# Patient Record
Sex: Male | Born: 1945 | Race: White | Hispanic: No | Marital: Married | Smoking: Former smoker
Health system: Southern US, Community
[De-identification: ages and names within clinical notes are randomized; demographics above are authoritative.]

## PROBLEM LIST (undated history)

## (undated) DIAGNOSIS — I2510 Atherosclerotic heart disease of native coronary artery without angina pectoris: Secondary | ICD-10-CM

## (undated) DIAGNOSIS — G8929 Other chronic pain: Secondary | ICD-10-CM

## (undated) DIAGNOSIS — F4310 Post-traumatic stress disorder, unspecified: Secondary | ICD-10-CM

## (undated) DIAGNOSIS — I255 Ischemic cardiomyopathy: Secondary | ICD-10-CM

## (undated) DIAGNOSIS — Z9861 Coronary angioplasty status: Secondary | ICD-10-CM

## (undated) DIAGNOSIS — K8020 Calculus of gallbladder without cholecystitis without obstruction: Secondary | ICD-10-CM

## (undated) DIAGNOSIS — N183 Chronic kidney disease, stage 3 unspecified: Secondary | ICD-10-CM

## (undated) DIAGNOSIS — F329 Major depressive disorder, single episode, unspecified: Secondary | ICD-10-CM

## (undated) DIAGNOSIS — K5792 Diverticulitis of intestine, part unspecified, without perforation or abscess without bleeding: Secondary | ICD-10-CM

## (undated) DIAGNOSIS — K5791 Diverticulosis of intestine, part unspecified, without perforation or abscess with bleeding: Secondary | ICD-10-CM

## (undated) DIAGNOSIS — I2119 ST elevation (STEMI) myocardial infarction involving other coronary artery of inferior wall: Secondary | ICD-10-CM

## (undated) DIAGNOSIS — N400 Enlarged prostate without lower urinary tract symptoms: Secondary | ICD-10-CM

## (undated) DIAGNOSIS — K219 Gastro-esophageal reflux disease without esophagitis: Secondary | ICD-10-CM

## (undated) DIAGNOSIS — R519 Headache, unspecified: Secondary | ICD-10-CM

## (undated) DIAGNOSIS — E7800 Pure hypercholesterolemia, unspecified: Secondary | ICD-10-CM

## (undated) DIAGNOSIS — F32A Depression, unspecified: Secondary | ICD-10-CM

## (undated) DIAGNOSIS — F0390 Unspecified dementia without behavioral disturbance: Secondary | ICD-10-CM

## (undated) DIAGNOSIS — I214 Non-ST elevation (NSTEMI) myocardial infarction: Secondary | ICD-10-CM

## (undated) DIAGNOSIS — I5042 Chronic combined systolic (congestive) and diastolic (congestive) heart failure: Secondary | ICD-10-CM

## (undated) DIAGNOSIS — C9110 Chronic lymphocytic leukemia of B-cell type not having achieved remission: Principal | ICD-10-CM

## (undated) DIAGNOSIS — R51 Headache: Secondary | ICD-10-CM

## (undated) HISTORY — DX: Atherosclerotic heart disease of native coronary artery without angina pectoris: Z98.61

## (undated) HISTORY — DX: Post-traumatic stress disorder, unspecified: F43.10

## (undated) HISTORY — DX: Chronic kidney disease, stage 3 unspecified: N18.30

## (undated) HISTORY — DX: Pure hypercholesterolemia, unspecified: E78.00

## (undated) HISTORY — DX: Atherosclerotic heart disease of native coronary artery without angina pectoris: I25.10

## (undated) HISTORY — DX: Chronic lymphocytic leukemia of B-cell type not having achieved remission: C91.10

## (undated) HISTORY — DX: Calculus of gallbladder without cholecystitis without obstruction: K80.20

## (undated) HISTORY — PX: TRANSTHORACIC ECHOCARDIOGRAM: SHX275

## (undated) HISTORY — DX: Chronic combined systolic (congestive) and diastolic (congestive) heart failure: I50.42

## (undated) HISTORY — PX: APPENDECTOMY: SHX54

## (undated) HISTORY — DX: Benign prostatic hyperplasia without lower urinary tract symptoms: N40.0

## (undated) HISTORY — DX: Diverticulitis of intestine, part unspecified, without perforation or abscess without bleeding: K57.92

## (undated) HISTORY — DX: Unspecified dementia, unspecified severity, without behavioral disturbance, psychotic disturbance, mood disturbance, and anxiety: F03.90

---

## 2004-02-28 ENCOUNTER — Emergency Department (HOSPITAL_COMMUNITY): Admission: EM | Admit: 2004-02-28 | Discharge: 2004-02-28 | Payer: Self-pay | Admitting: Emergency Medicine

## 2004-09-29 ENCOUNTER — Ambulatory Visit (HOSPITAL_COMMUNITY): Admission: RE | Admit: 2004-09-29 | Discharge: 2004-09-29 | Payer: Self-pay | Admitting: Family Medicine

## 2006-06-06 HISTORY — PX: COLONOSCOPY: SHX174

## 2006-12-18 ENCOUNTER — Encounter (HOSPITAL_COMMUNITY): Payer: Self-pay | Admitting: Oncology

## 2006-12-18 ENCOUNTER — Encounter (HOSPITAL_COMMUNITY): Admission: RE | Admit: 2006-12-18 | Discharge: 2007-01-17 | Payer: Self-pay | Admitting: Oncology

## 2006-12-18 ENCOUNTER — Ambulatory Visit (HOSPITAL_COMMUNITY): Payer: Self-pay | Admitting: Oncology

## 2007-01-29 ENCOUNTER — Encounter: Payer: Self-pay | Admitting: Internal Medicine

## 2007-01-29 ENCOUNTER — Ambulatory Visit: Payer: Self-pay | Admitting: Internal Medicine

## 2007-01-29 ENCOUNTER — Ambulatory Visit (HOSPITAL_COMMUNITY): Admission: RE | Admit: 2007-01-29 | Discharge: 2007-01-29 | Payer: Self-pay | Admitting: Internal Medicine

## 2007-02-09 ENCOUNTER — Ambulatory Visit (HOSPITAL_COMMUNITY): Payer: Self-pay | Admitting: Oncology

## 2007-02-09 ENCOUNTER — Encounter (HOSPITAL_COMMUNITY): Admission: RE | Admit: 2007-02-09 | Discharge: 2007-03-06 | Payer: Self-pay | Admitting: Oncology

## 2007-05-08 ENCOUNTER — Ambulatory Visit (HOSPITAL_COMMUNITY): Admission: RE | Admit: 2007-05-08 | Discharge: 2007-05-08 | Payer: Self-pay | Admitting: Family Medicine

## 2007-08-13 ENCOUNTER — Ambulatory Visit (HOSPITAL_COMMUNITY): Payer: Self-pay | Admitting: Oncology

## 2007-08-13 ENCOUNTER — Encounter (HOSPITAL_COMMUNITY): Admission: RE | Admit: 2007-08-13 | Discharge: 2007-09-12 | Payer: Self-pay | Admitting: Oncology

## 2008-03-07 ENCOUNTER — Ambulatory Visit (HOSPITAL_COMMUNITY): Admission: RE | Admit: 2008-03-07 | Discharge: 2008-03-07 | Payer: Self-pay | Admitting: Family Medicine

## 2008-03-21 ENCOUNTER — Encounter (HOSPITAL_COMMUNITY): Admission: RE | Admit: 2008-03-21 | Discharge: 2008-04-20 | Payer: Self-pay | Admitting: Oncology

## 2008-03-21 ENCOUNTER — Ambulatory Visit (HOSPITAL_COMMUNITY): Payer: Self-pay | Admitting: Oncology

## 2008-08-01 ENCOUNTER — Ambulatory Visit (HOSPITAL_COMMUNITY): Admission: RE | Admit: 2008-08-01 | Discharge: 2008-08-01 | Payer: Self-pay | Admitting: Family Medicine

## 2008-09-09 ENCOUNTER — Ambulatory Visit (HOSPITAL_COMMUNITY): Payer: Self-pay | Admitting: Oncology

## 2008-11-25 ENCOUNTER — Ambulatory Visit (HOSPITAL_COMMUNITY): Payer: Self-pay | Admitting: Oncology

## 2008-11-25 ENCOUNTER — Encounter (HOSPITAL_COMMUNITY): Admission: RE | Admit: 2008-11-25 | Discharge: 2008-12-25 | Payer: Self-pay | Admitting: Oncology

## 2009-04-06 ENCOUNTER — Encounter (HOSPITAL_COMMUNITY): Admission: RE | Admit: 2009-04-06 | Discharge: 2009-05-06 | Payer: Self-pay | Admitting: Oncology

## 2009-04-06 ENCOUNTER — Ambulatory Visit (HOSPITAL_COMMUNITY): Payer: Self-pay | Admitting: Oncology

## 2009-10-05 ENCOUNTER — Encounter (HOSPITAL_COMMUNITY): Admission: RE | Admit: 2009-10-05 | Discharge: 2009-11-04 | Payer: Self-pay | Admitting: Oncology

## 2009-10-06 ENCOUNTER — Ambulatory Visit (HOSPITAL_COMMUNITY): Payer: Self-pay | Admitting: Oncology

## 2010-01-20 ENCOUNTER — Ambulatory Visit (HOSPITAL_COMMUNITY): Admission: RE | Admit: 2010-01-20 | Discharge: 2010-01-20 | Payer: Self-pay | Admitting: Family Medicine

## 2010-02-09 ENCOUNTER — Ambulatory Visit (HOSPITAL_COMMUNITY): Admission: RE | Admit: 2010-02-09 | Discharge: 2010-02-09 | Payer: Self-pay | Admitting: Family Medicine

## 2010-04-08 ENCOUNTER — Ambulatory Visit (HOSPITAL_COMMUNITY): Payer: Self-pay | Admitting: Oncology

## 2010-04-08 ENCOUNTER — Encounter (HOSPITAL_COMMUNITY)
Admission: RE | Admit: 2010-04-08 | Discharge: 2010-05-08 | Payer: Self-pay | Source: Home / Self Care | Admitting: Oncology

## 2010-08-17 LAB — CBC
HCT: 47.6 % (ref 39.0–52.0)
Hemoglobin: 15.2 g/dL (ref 13.0–17.0)
MCH: 29.8 pg (ref 26.0–34.0)
MCHC: 31.9 g/dL (ref 30.0–36.0)
MCV: 93.4 fL (ref 78.0–100.0)
Platelets: 208 10*3/uL (ref 150–400)
RBC: 5.1 MIL/uL (ref 4.22–5.81)
RDW: 14.3 % (ref 11.5–15.5)
WBC: 44.3 10*3/uL — ABNORMAL HIGH (ref 4.0–10.5)

## 2010-08-17 LAB — DIFFERENTIAL
Basophils Absolute: 0.1 10*3/uL (ref 0.0–0.1)
Basophils Relative: 0 % (ref 0–1)
Eosinophils Absolute: 0.3 10*3/uL (ref 0.0–0.7)
Eosinophils Relative: 1 % (ref 0–5)
Lymphocytes Relative: 88 % — ABNORMAL HIGH (ref 12–46)
Lymphs Abs: 39.1 10*3/uL — ABNORMAL HIGH (ref 0.7–4.0)
Monocytes Absolute: 1.7 10*3/uL — ABNORMAL HIGH (ref 0.1–1.0)
Monocytes Relative: 4 % (ref 3–12)
Neutro Abs: 3.2 10*3/uL (ref 1.7–7.7)
Neutrophils Relative %: 7 % — ABNORMAL LOW (ref 43–77)

## 2010-08-24 LAB — DIFFERENTIAL
Basophils Absolute: 0 10*3/uL (ref 0.0–0.1)
Basophils Relative: 0 % (ref 0–1)
Eosinophils Absolute: 0.5 10*3/uL (ref 0.0–0.7)
Eosinophils Relative: 1 % (ref 0–5)
Lymphocytes Relative: 90 % — ABNORMAL HIGH (ref 12–46)
Lymphs Abs: 44.4 10*3/uL — ABNORMAL HIGH (ref 0.7–4.0)
Monocytes Absolute: 0.5 10*3/uL (ref 0.1–1.0)
Monocytes Relative: 1 % — ABNORMAL LOW (ref 3–12)
Neutro Abs: 3.9 10*3/uL (ref 1.7–7.7)
Neutrophils Relative %: 8 % — ABNORMAL LOW (ref 43–77)

## 2010-08-24 LAB — CBC
HCT: 48.2 % (ref 39.0–52.0)
Hemoglobin: 15.9 g/dL (ref 13.0–17.0)
MCHC: 33.1 g/dL (ref 30.0–36.0)
MCV: 92.8 fL (ref 78.0–100.0)
Platelets: 205 10*3/uL (ref 150–400)
RBC: 5.19 MIL/uL (ref 4.22–5.81)
RDW: 14.7 % (ref 11.5–15.5)
WBC: 49.3 10*3/uL — ABNORMAL HIGH (ref 4.0–10.5)

## 2010-09-08 LAB — CBC
HCT: 47.5 % (ref 39.0–52.0)
Hemoglobin: 15.6 g/dL (ref 13.0–17.0)
MCHC: 32.8 g/dL (ref 30.0–36.0)
MCV: 93.5 fL (ref 78.0–100.0)
Platelets: 209 10*3/uL (ref 150–400)
RBC: 5.08 MIL/uL (ref 4.22–5.81)
RDW: 14 % (ref 11.5–15.5)
WBC: 41.1 10*3/uL — ABNORMAL HIGH (ref 4.0–10.5)

## 2010-09-08 LAB — DIFFERENTIAL
Basophils Absolute: 0 10*3/uL (ref 0.0–0.1)
Basophils Relative: 0 % (ref 0–1)
Eosinophils Absolute: 0.4 10*3/uL (ref 0.0–0.7)
Eosinophils Relative: 1 % (ref 0–5)
Lymphocytes Relative: 85 % — ABNORMAL HIGH (ref 12–46)
Lymphs Abs: 34.9 10*3/uL — ABNORMAL HIGH (ref 0.7–4.0)
Monocytes Absolute: 2.1 10*3/uL — ABNORMAL HIGH (ref 0.1–1.0)
Monocytes Relative: 5 % (ref 3–12)
Neutro Abs: 3.7 10*3/uL (ref 1.7–7.7)
Neutrophils Relative %: 9 % — ABNORMAL LOW (ref 43–77)

## 2010-09-13 LAB — DIFFERENTIAL
Basophils Absolute: 0 10*3/uL (ref 0.0–0.1)
Basophils Relative: 0 % (ref 0–1)
Eosinophils Absolute: 0.3 10*3/uL (ref 0.0–0.7)
Eosinophils Relative: 1 % (ref 0–5)
Lymphocytes Relative: 86 % — ABNORMAL HIGH (ref 12–46)
Lymphs Abs: 29.5 10*3/uL — ABNORMAL HIGH (ref 0.7–4.0)
Monocytes Absolute: 0.3 10*3/uL (ref 0.1–1.0)
Monocytes Relative: 1 % — ABNORMAL LOW (ref 3–12)
Neutro Abs: 4.1 10*3/uL (ref 1.7–7.7)
Neutrophils Relative %: 12 % — ABNORMAL LOW (ref 43–77)

## 2010-09-13 LAB — IMMUNOFIXATION ELECTROPHORESIS
IgA: 161 mg/dL (ref 68–378)
IgG (Immunoglobin G), Serum: 995 mg/dL (ref 694–1618)
IgM, Serum: 176 mg/dL (ref 60–263)
Total Protein ELP: 6.7 g/dL (ref 6.0–8.3)

## 2010-09-13 LAB — CBC
HCT: 46.8 % (ref 39.0–52.0)
Hemoglobin: 15.4 g/dL (ref 13.0–17.0)
MCHC: 33 g/dL (ref 30.0–36.0)
MCV: 92.5 fL (ref 78.0–100.0)
Platelets: 188 10*3/uL (ref 150–400)
RBC: 5.06 MIL/uL (ref 4.22–5.81)
RDW: 14.6 % (ref 11.5–15.5)
WBC: 34.2 10*3/uL — ABNORMAL HIGH (ref 4.0–10.5)

## 2010-09-13 LAB — IGG, IGA, IGM
IgA: 169 mg/dL (ref 68–378)
IgG (Immunoglobin G), Serum: 1030 mg/dL (ref 694–1618)
IgM, Serum: 186 mg/dL (ref 60–263)

## 2010-09-13 LAB — LACTATE DEHYDROGENASE: LDH: 149 U/L (ref 94–250)

## 2010-09-13 LAB — PROTEIN ELECTROPH W RFLX QUANT IMMUNOGLOBULINS
Albumin ELP: 62.5 % (ref 55.8–66.1)
Alpha-1-Globulin: 3.8 % (ref 2.9–4.9)
Alpha-2-Globulin: 9.1 % (ref 7.1–11.8)
Beta 2: 4.3 % (ref 3.2–6.5)
Beta Globulin: 6.1 % (ref 4.7–7.2)
Gamma Globulin: 14.2 % (ref 11.1–18.8)
M-Spike, %: NOT DETECTED g/dL
Total Protein ELP: 6.9 g/dL (ref 6.0–8.3)

## 2010-09-13 LAB — IMMUNOFIXATION ADD-ON

## 2010-09-23 ENCOUNTER — Other Ambulatory Visit (HOSPITAL_COMMUNITY): Payer: Self-pay

## 2010-09-24 ENCOUNTER — Ambulatory Visit (HOSPITAL_COMMUNITY): Payer: Self-pay | Admitting: Oncology

## 2010-09-24 ENCOUNTER — Encounter (HOSPITAL_COMMUNITY): Payer: Self-pay | Admitting: Oncology

## 2010-09-24 ENCOUNTER — Ambulatory Visit (HOSPITAL_COMMUNITY): Payer: Self-pay | Admitting: *Deleted

## 2010-09-28 ENCOUNTER — Encounter (HOSPITAL_COMMUNITY): Payer: Medicare Other | Attending: Oncology

## 2010-09-28 DIAGNOSIS — C9110 Chronic lymphocytic leukemia of B-cell type not having achieved remission: Secondary | ICD-10-CM | POA: Insufficient documentation

## 2010-09-29 ENCOUNTER — Encounter (HOSPITAL_COMMUNITY): Payer: Medicare Other | Admitting: Oncology

## 2010-09-29 DIAGNOSIS — C9110 Chronic lymphocytic leukemia of B-cell type not having achieved remission: Secondary | ICD-10-CM

## 2010-10-19 NOTE — Op Note (Signed)
[name], [name]                ACCOUNT NO.:  [id number]   MEDICAL RECORD NO.:  [id number]          PATIENT TYPE:  AMB   LOCATION:  DAY                           FACILITY:  [facility]   PHYSICIAN:  R. Roetta Sessions, M.D. DATE OF BIRTH:  19-Sep-1945   DATE OF PROCEDURE:  01/29/2007  DATE OF DISCHARGE:                               OPERATIVE REPORT   COLONOSCOPY WITH BIOPSY:   INDICATIONS FOR PROCEDURE:  A 65 year old gentleman referred out of the  courtesy of Dr. Butch Penny for colorectal cancer screening.  He has  never had his lower GI tract imaged.  He has no lower GI tract symptoms.  There is no family history of colorectal neoplasia.  Colonoscopy is now  being done.  This approach has been discussed with the patient at  length.  Potential risks, benefits and alternatives have been reviewed,  questions answered.  He is agreeable.  Please see documentation in the  medical record.   PROCEDURE NOTE:  O2 saturation, blood pressure, pulse and respirations  were monitored throughout the entire procedure.  Conscious sedation:  Versed 3 mg IV, Demerol 50 mg IV.  Instrument:  Pentax video chip  system.   FINDINGS:  Digital rectal exam revealed no abnormalities.  Endoscopic  findings:  The prep was good.   Colon:  The colonic mucosa was surveyed from the rectosigmoid junction  through the left, transverse, right colon to area of the appendiceal  orifice, ileocecal valve and cecum.  These structures were well-seen and  photographed for the record.  From this level the scope was slowly  withdrawn and all previously-mentioned mucosal surfaces were again seen.  The patient had extensive left-sided diverticula.  There was a single  diminutive polyp in the base of the cecum, which was cold  biopsy/removed.  The remainder of colonic mucosa appeared normal.  The  scope was pulled down the rectum and a thorough examination of the  rectal mucosa including a retroflex view of the anal verge  demonstrated  no abnormalities.  The patient tolerated the procedure well and was  reacted in endoscopy.   IMPRESSION:  1. Normal rectum.  2. Left-sided diverticula, diminutive polyp in the cecum, status post      cold biopsy removal.  Remainder of the colonic mucosa appeared      normal.   RECOMMENDATIONS:  1. Diverticulosis/polyp literature provided to [name].  2. Follow up on pathology.  3. Further recommendations to follow.      Jonathon Bellows, M.D.  Electronically Signed     RMR/MEDQ  D:  01/29/2007  T:  01/30/2007  Job:  [id number]   cc:   Angus G. Renard Matter, MD  Fax: [phone]

## 2011-02-28 LAB — COMPREHENSIVE METABOLIC PANEL
ALT: 19
AST: 27
Albumin: 3.6
Alkaline Phosphatase: 53
BUN: 9
CO2: 26
Calcium: 9.1
Chloride: 106
Creatinine, Ser: 1.11
GFR calc Af Amer: >60
GFR calc non Af Amer: >60
Glucose, Bld: 123 — ABNORMAL HIGH
Potassium: 3.5
Sodium: 137
Total Bilirubin: 0.7
Total Protein: 6.7

## 2011-02-28 LAB — CBC
HCT: 45.5
Hemoglobin: 15.2
MCHC: 33.5
MCV: 89.8
Platelets: 217
RBC: 5.06
RDW: 14.4
WBC: 28 — ABNORMAL HIGH

## 2011-02-28 LAB — DIFFERENTIAL
Basophils Absolute: 0.1
Basophils Relative: 0
Eosinophils Absolute: 0.3
Eosinophils Relative: 1
Lymphocytes Relative: 84 — ABNORMAL HIGH
Lymphs Abs: 23.6 — ABNORMAL HIGH
Monocytes Absolute: 0.6
Monocytes Relative: 2 — ABNORMAL LOW
Neutro Abs: 3.5
Neutrophils Relative %: 13 — ABNORMAL LOW

## 2011-02-28 LAB — LACTATE DEHYDROGENASE: LDH: 141

## 2011-03-07 LAB — DIFFERENTIAL
Basophils Absolute: 0
Basophils Relative: 0
Eosinophils Absolute: 0.2
Eosinophils Relative: 1
Lymphocytes Relative: 85 — ABNORMAL HIGH
Lymphs Abs: 21.7 — ABNORMAL HIGH
Monocytes Absolute: 0.6
Monocytes Relative: 3
Neutro Abs: 3
Neutrophils Relative %: 12 — ABNORMAL LOW

## 2011-03-07 LAB — CBC
HCT: 47.8
Hemoglobin: 15.8
MCHC: 33
MCV: 91.9
Platelets: 200
RBC: 5.21
RDW: 14
WBC: 25.5 — ABNORMAL HIGH

## 2011-03-18 LAB — CBC
HCT: 46.3
Hemoglobin: 15.2
MCHC: 32.8
MCV: 91
Platelets: 217
RBC: 5.09
RDW: 14.3 — ABNORMAL HIGH
WBC: 27.5 — ABNORMAL HIGH

## 2011-03-18 LAB — DIFFERENTIAL
Band Neutrophils: 0
Basophils Absolute: 0
Basophils Relative: 0
Blasts: 0
Eosinophils Absolute: 1.1 — ABNORMAL HIGH
Eosinophils Relative: 4
Lymphocytes Relative: 75 — ABNORMAL HIGH
Lymphs Abs: 20.6 — ABNORMAL HIGH
Metamyelocytes Relative: 0
Monocytes Absolute: 1.4 — ABNORMAL HIGH
Monocytes Relative: 5
Myelocytes: 0
Neutro Abs: 4.4
Neutrophils Relative %: 16 — ABNORMAL LOW
Promyelocytes Absolute: 0
nRBC: 0

## 2011-03-21 LAB — MISCELLANEOUS TEST

## 2011-03-22 LAB — CBC
HCT: 46.4
Hemoglobin: 15.3
MCHC: 33
MCV: 88.8
Platelets: 212
RBC: 5.23
RDW: 14.8 — ABNORMAL HIGH
WBC: 27.5 — ABNORMAL HIGH

## 2011-03-22 LAB — DIFFERENTIAL
Basophils Absolute: 0
Basophils Relative: 0
Eosinophils Absolute: 0.3
Eosinophils Relative: 1
Lymphocytes Relative: 80 — ABNORMAL HIGH
Lymphs Abs: 21.9 — ABNORMAL HIGH
Monocytes Absolute: 0.6
Monocytes Relative: 2 — ABNORMAL LOW
Neutro Abs: 4.7
Neutrophils Relative %: 17 — ABNORMAL LOW

## 2011-04-07 ENCOUNTER — Encounter (HOSPITAL_COMMUNITY): Payer: Medicare Other | Attending: Oncology

## 2011-04-07 DIAGNOSIS — N400 Enlarged prostate without lower urinary tract symptoms: Secondary | ICD-10-CM | POA: Insufficient documentation

## 2011-04-07 DIAGNOSIS — C9110 Chronic lymphocytic leukemia of B-cell type not having achieved remission: Secondary | ICD-10-CM

## 2011-04-07 LAB — DIFFERENTIAL
Basophils Absolute: 0.1 10*3/uL (ref 0.0–0.1)
Basophils Relative: 0 % (ref 0–1)
Eosinophils Absolute: 0.3 10*3/uL (ref 0.0–0.7)
Eosinophils Relative: 1 % (ref 0–5)
Lymphocytes Relative: 91 % — ABNORMAL HIGH (ref 12–46)
Lymphs Abs: 53.9 10*3/uL — ABNORMAL HIGH (ref 0.7–4.0)
Monocytes Absolute: 1 10*3/uL (ref 0.1–1.0)
Monocytes Relative: 2 % — ABNORMAL LOW (ref 3–12)
Neutro Abs: 3.9 10*3/uL (ref 1.7–7.7)
Neutrophils Relative %: 7 % — ABNORMAL LOW (ref 43–77)

## 2011-04-07 LAB — CBC
HCT: 50.9 % (ref 39.0–52.0)
Hemoglobin: 16 g/dL (ref 13.0–17.0)
MCH: 30.1 pg (ref 26.0–34.0)
MCHC: 31.4 g/dL (ref 30.0–36.0)
MCV: 95.9 fL (ref 78.0–100.0)
Platelets: 213 10*3/uL (ref 150–400)
RBC: 5.31 MIL/uL (ref 4.22–5.81)
RDW: 14.5 % (ref 11.5–15.5)
WBC: 59.1 10*3/uL (ref 4.0–10.5)

## 2011-04-07 NOTE — Progress Notes (Signed)
Labs drawn today for cbc/diff 

## 2011-04-08 ENCOUNTER — Encounter (HOSPITAL_BASED_OUTPATIENT_CLINIC_OR_DEPARTMENT_OTHER): Payer: Medicare Other | Admitting: Oncology

## 2011-04-08 ENCOUNTER — Encounter (HOSPITAL_COMMUNITY): Payer: Self-pay | Admitting: Oncology

## 2011-04-08 VITALS — BP 154/82 | HR 64 | Temp 97.6°F | Ht 66.0 in | Wt 178.0 lb

## 2011-04-08 DIAGNOSIS — C9110 Chronic lymphocytic leukemia of B-cell type not having achieved remission: Secondary | ICD-10-CM | POA: Insufficient documentation

## 2011-04-08 DIAGNOSIS — N400 Enlarged prostate without lower urinary tract symptoms: Secondary | ICD-10-CM

## 2011-04-08 HISTORY — DX: Chronic lymphocytic leukemia of B-cell type not having achieved remission: C91.10

## 2011-04-08 HISTORY — DX: Benign prostatic hyperplasia without lower urinary tract symptoms: N40.0

## 2011-04-08 NOTE — Patient Instructions (Signed)
[facility]  Discharge Instructions  RECOMMENDATIONS MADE BY THE CONSULTANT AND ANY TEST RESULTS WILL BE SENT TO YOUR REFERRING DOCTOR.   EXAM FINDINGS BY MD TODAY AND SIGNS AND SYMPTOMS TO REPORT TO CLINIC OR PRIMARY MD: you are doing well.  Report recurrent infections, fevers, etc.  MEDICATIONS PRESCRIBED: none    SPECIAL INSTRUCTIONS/FOLLOW-UP: Lab work Needed in 6 months and Return to Clinic to see PA in 6 months after lab results available.   I acknowledge that I have been informed and understand all the instructions given to me and received a copy. I do not have any more questions at this time, but understand that I may call the Specialty Clinic at [facility] at [phone] during business hours should I have any further questions or need assistance in obtaining follow-up care.    __________________________________________  _____________  __________ Signature of Patient or Authorized Representative            Date                   Time    __________________________________________ Nurse's Signature

## 2011-04-08 NOTE — Progress Notes (Signed)
[name] Reichert, MD [address] [place] [address]  1. CLL (chronic lymphocytic leukemia)  vitamin C (ASCORBIC ACID) 250 MG tablet, beta carotene 60454 UNIT capsule, Cholecalciferol (D3-1000) 1000 UNITS capsule, HYDROcodone-acetaminophen (VICODIN ES) 7.5-750 MG per tablet, ezetimibe-simvastatin (VYTORIN) 10-40 MG per tablet, metroNIDAZOLE (FLAGYL) 500 MG tablet, aspirin 81 MG tablet, CBC, Differential, Comprehensive metabolic panel, Lactate dehydrogenase  2. BPH (benign prostatic hyperplasia)      CURRENT THERAPY:Observation  INTERVAL HISTORY: [name] 65 y.o. male returns for  regular  visit for followup of Stage I CLL.  The patient remains asymptomatic.  He denies any B-symptoms including fevers, chills, night sweats, weight loss, and decreased appetite.   He denies any new lumps or bumps.  I personally reviewed and went over laboratory results with the patient.  He understands that his WBC count is nearly 60.0, but his Hgb and platelets remain stable.    The patient reports that he continues to find a job.  He reports that no one is hiring and this is frustrating him.  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  Past Medical History  Diagnosis Date  . Leukemia     cll  . Diverticulitis   . Hypercholesteremia   . 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 BPH (benign prostatic hyperplasia) 04/08/2011    has CLL (chronic lymphocytic leukemia) and BPH (benign prostatic hyperplasia) on his problem list.      has no known allergies.  [name] does not currently have medications on file.  Past Surgical History  Procedure Date  . Appendectomy [id number]  . Colonoscopy 2008    Denies any headaches, dizziness, double vision, fevers, chills, night sweats, nausea, vomiting, diarrhea, constipation, chest pain, heart palpitations, shortness of breath, blood in stool, black tarry stool, urinary pain, urinary burning, urinary frequency, hematuria.   PHYSICAL  EXAMINATION  ECOG PERFORMANCE STATUS: 0 - Asymptomatic  Filed Vitals:   04/08/11 0948  BP: 154/82  Pulse: 64  Temp: 97.6 F (36.4 C)    GENERAL:alert, no distress, well nourished, well developed, comfortable, cooperative and smiling SKIN: skin color, texture, turgor are normal HEAD: Normocephalic EYES: normal EARS: External ears normal OROPHARYNX:mucous membranes are moist  NECK: supple, no bruits, no JVD, thyroid normal size, non-tender, without nodularity, no stridor, non-tender LYMPH:  no hepatosplenomegaly, few small anterior cervical nodes, few small supraclavicular lymph nodes BREAST:not examined LUNGS: clear to auscultation and percussion HEART: regular rate & rhythm, no murmurs, no gallops, S1 normal and S2 normal ABDOMEN:abdomen soft, non-tender, normal bowel sounds and no hepatosplenomegaly BACK: Back symmetric, no curvature. EXTREMITIES:less then 2 second capillary refill, no joint deformities, effusion, or inflammation, no edema, no skin discoloration, no clubbing, no cyanosis  NEURO: alert & oriented x 3 with fluent speech, no focal motor/sensory deficits, gait normal   LABORATORY DATA: CBC    Component Value Date/Time   WBC 59.1* 04/07/2011 0945   RBC 5.31 04/07/2011 0945   HGB 16.0 04/07/2011 0945   HCT 50.9 04/07/2011 0945   PLT 213 04/07/2011 0945   MCV 95.9 04/07/2011 0945   MCH 30.1 04/07/2011 0945   MCHC 31.4 04/07/2011 0945   RDW 14.5 04/07/2011 0945   LYMPHSABS 53.9* 04/07/2011 0945   MONOABS 1.0 04/07/2011 0945   EOSABS 0.3 04/07/2011 0945   BASOSABS 0.1 04/07/2011 0945      Chemistry      Component Value Date/Time   NA 137 08/13/2007 1139   K 3.5 08/13/2007 1139  CL 106 08/13/2007 1139   CO2 26 08/13/2007 1139   BUN 9 08/13/2007 1139   CREATININE 1.11 08/13/2007 1139      Component Value Date/Time   CALCIUM 9.1 08/13/2007 1139   ALKPHOS 53 08/13/2007 1139   AST 27 08/13/2007 1139   ALT 19 08/13/2007 1139   BILITOT 0.7 08/13/2007 1139        ASSESSMENT:  1.  CLL, stage I, ZAP-negative, continues to be asymptomatic 2. Mild obesity 3. BPH   PLAN:  1. Lab work in 6 months: CBC diff, CMET, LDH 2. Return in 6 months for follow-up.   All questions were answered. The patient knows to call the clinic with any problems, questions or concerns. We can certainly see the patient much sooner if necessary.   [name]

## 2011-10-10 ENCOUNTER — Encounter (HOSPITAL_COMMUNITY): Payer: Medicare Other | Attending: Oncology

## 2011-10-10 DIAGNOSIS — G479 Sleep disorder, unspecified: Secondary | ICD-10-CM | POA: Insufficient documentation

## 2011-10-10 DIAGNOSIS — C9110 Chronic lymphocytic leukemia of B-cell type not having achieved remission: Secondary | ICD-10-CM | POA: Insufficient documentation

## 2011-10-10 DIAGNOSIS — N400 Enlarged prostate without lower urinary tract symptoms: Secondary | ICD-10-CM | POA: Insufficient documentation

## 2011-10-10 DIAGNOSIS — E669 Obesity, unspecified: Secondary | ICD-10-CM | POA: Insufficient documentation

## 2011-10-10 LAB — DIFFERENTIAL
Basophils Absolute: 0.1 10*3/uL (ref 0.0–0.1)
Basophils Relative: 0 % (ref 0–1)
Eosinophils Absolute: 0.3 10*3/uL (ref 0.0–0.7)
Eosinophils Relative: 1 % (ref 0–5)
Lymphocytes Relative: 92 % — ABNORMAL HIGH (ref 12–46)
Lymphs Abs: 52.1 10*3/uL — ABNORMAL HIGH (ref 0.7–4.0)
Monocytes Absolute: 0.9 10*3/uL (ref 0.1–1.0)
Monocytes Relative: 2 % — ABNORMAL LOW (ref 3–12)
Neutro Abs: 3.6 10*3/uL (ref 1.7–7.7)
Neutrophils Relative %: 6 % — ABNORMAL LOW (ref 43–77)

## 2011-10-10 LAB — CBC
HCT: 47.7 % (ref 39.0–52.0)
Hemoglobin: 15.2 g/dL (ref 13.0–17.0)
MCH: 30.6 pg (ref 26.0–34.0)
MCHC: 31.9 g/dL (ref 30.0–36.0)
MCV: 96 fL (ref 78.0–100.0)
Platelets: 181 10*3/uL (ref 150–400)
RBC: 4.97 MIL/uL (ref 4.22–5.81)
RDW: 14.6 % (ref 11.5–15.5)
WBC: 56.9 10*3/uL (ref 4.0–10.5)

## 2011-10-10 LAB — COMPREHENSIVE METABOLIC PANEL
ALT: 16 U/L (ref 0–53)
AST: 26 U/L (ref 0–37)
Albumin: 3.7 g/dL (ref 3.5–5.2)
Alkaline Phosphatase: 61 U/L (ref 39–117)
BUN: 11 mg/dL (ref 6–23)
CO2: 26 mEq/L (ref 19–32)
Calcium: 9.6 mg/dL (ref 8.4–10.5)
Chloride: 108 mEq/L (ref 96–112)
Creatinine, Ser: 1.2 mg/dL (ref 0.50–1.35)
GFR calc Af Amer: 71 mL/min — ABNORMAL LOW (ref >90)
GFR calc non Af Amer: 61 mL/min — ABNORMAL LOW (ref >90)
Glucose, Bld: 101 mg/dL — ABNORMAL HIGH (ref 70–99)
Potassium: 4.3 mEq/L (ref 3.5–5.1)
Sodium: 142 mEq/L (ref 135–145)
Total Bilirubin: 0.8 mg/dL (ref 0.3–1.2)
Total Protein: 6.6 g/dL (ref 6.0–8.3)

## 2011-10-10 LAB — LACTATE DEHYDROGENASE: LDH: 177 U/L (ref 94–250)

## 2011-10-10 NOTE — Progress Notes (Signed)
CRITICAL VALUE ALERT Critical value received:  WBC 56,900 Date of notification:  10/10/11 Time of notification: 1215 Critical value read back:  yes Nurse who received alert:  Tobie Lords, RN MD notified (1st page):  Dr. Mariel Sleet.

## 2011-10-10 NOTE — Progress Notes (Signed)
Labs drawn today for cbc/diff,cmp,ldh 

## 2011-10-12 ENCOUNTER — Encounter (HOSPITAL_BASED_OUTPATIENT_CLINIC_OR_DEPARTMENT_OTHER): Payer: Medicare Other | Admitting: Oncology

## 2011-10-12 VITALS — BP 158/88 | HR 59 | Temp 97.4°F | Wt 183.8 lb

## 2011-10-12 DIAGNOSIS — C9110 Chronic lymphocytic leukemia of B-cell type not having achieved remission: Secondary | ICD-10-CM

## 2011-10-12 DIAGNOSIS — G4700 Insomnia, unspecified: Secondary | ICD-10-CM

## 2011-10-12 NOTE — Patient Instructions (Signed)
[facility]  Discharge Instructions  RECOMMENDATIONS MADE BY THE CONSULTANT AND ANY TEST RESULTS WILL BE SENT TO YOUR REFERRING DOCTOR.  SPECIAL INSTRUCTIONS/FOLLOW-UP: Lab work Needed in 3 months and in 6 months and Return to Clinic in 6 months to see Dr. Mariel Sleet.   I acknowledge that I have been informed and understand all the instructions given to me and received a copy. I do not have any more questions at this time, but understand that I may call the Specialty Clinic at [facility] at [phone] during business hours should I have any further questions or need assistance in obtaining follow-up care.    __________________________________________  _____________  __________ Signature of Patient or Authorized Representative            Date                   Time    __________________________________________ Nurse's Signature

## 2011-10-12 NOTE — Progress Notes (Signed)
[name], MD, MD [address] [place] [address]  1. CLL (chronic lymphocytic leukemia)  meloxicam (MOBIC) 15 MG tablet, cyclobenzaprine (FLEXERIL) 5 MG tablet, CBC, Differential, CBC, Differential, Lactate dehydrogenase    CURRENT THERAPY: Observation  INTERVAL HISTORY: [name] 66 y.o. male returns for  regular  visit for followup of CLL.  The patient is doing very well. He denies any B. symptoms including fevers, chills, night sweats, appetite change, weight loss. He does admit to some difficulty sleeping. He reports that he finds herself falling asleep on his recliner watching television. He is unsure what time this is but it is early in the night. He then awakes around 2 AM and reports to his bed. He then has difficulty falling asleep. This is likely an issue from him falling asleep early in the evening and then have a difficult time finding restful sleep afterwards. I suggested he try Benadryl or Tylenol PM at hour of sleep. I recommend he does not to take this for 2 or 3 in the morning when he awakes to report back to bed because it will cause him to be drowsy during the daytime. He understands this. I suggested he start with one tablet at our sleep. If this is effective, continue with one tablet at hour of sleep. If he requires more, he may increase to 2 tablets at bedtime. If this is ineffective, he should report was primary care physician for further management. He may be a candidate for [facility], but I will leave as the primary care physician to manage.  I personally reviewed and went over laboratory results with the patient. We discussed his complete blood counts results. His white blood cell count remains stable. His lymphocytes also remain stable. I provided the patient education regarding his CLL. He understands he is 5 times white blood cells that make up the white blood cell count. For his disease, he has an elevated lymphocyte count which causes the increased  white blood cell count. He is very grateful for this patient education.  Mr. Schreur remains on the job search.    Past Medical History  Diagnosis Date  . Leukemia     cll  . Diverticulitis   . Hypercholesteremia   . 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 BPH (benign prostatic hyperplasia) 04/08/2011    has CLL (chronic lymphocytic leukemia) and BPH (benign prostatic hyperplasia) on his problem list.      has no known allergies.  Mr. Recinos does not currently have medications on file.  Past Surgical History  Procedure Date  . Appendectomy [id number]  . Colonoscopy 2008    Denies any headaches, dizziness, double vision, fevers, chills, night sweats, nausea, vomiting, diarrhea, constipation, chest pain, heart palpitations, shortness of breath, blood in stool, black tarry stool, urinary pain, urinary burning, urinary frequency, hematuria.   PHYSICAL EXAMINATION  ECOG PERFORMANCE STATUS: 0 - Asymptomatic  Filed Vitals:   10/12/11 1045  BP: 158/88  Pulse: 59  Temp: 97.4 F (36.3 C)    GENERAL:alert, healthy, no distress, well nourished, well developed, comfortable, cooperative and smiling SKIN: skin color, texture, turgor are normal, no rashes or significant lesions HEAD: Normocephalic, No masses, lesions, tenderness or abnormalities EYES: normal, EOMI, Conjunctiva are pink and non-injected EARS: External ears normal OROPHARYNX:lips, buccal mucosa, and tongue normal and mucous membranes are moist  NECK: supple, no adenopathy, no JVD, thyroid normal size, non-tender, without nodularity, no stridor, non-tender, trachea midline LYMPH:  no palpable lymphadenopathy, no hepatosplenomegaly BREAST:not  examined LUNGS: clear to auscultation and percussion HEART: regular rate & rhythm, no murmurs, no gallops, S1 normal and S2 normal ABDOMEN:abdomen soft, non-tender, obese, normal bowel sounds, no masses or organomegaly and no hepatosplenomegaly BACK: Back symmetric, no  curvature., No CVA tenderness EXTREMITIES:less then 2 second capillary refill, no joint deformities, effusion, or inflammation, no skin discoloration, no clubbing, no cyanosis, positive findings:  edema 1+ pre-tibial pitting edema B/L  NEURO: alert & oriented x 3 with fluent speech, no focal motor/sensory deficits, gait normal   LABORATORY DATA: CBC    Component Value Date/Time   WBC 56.9* 10/10/2011 1046   RBC 4.97 10/10/2011 1046   HGB 15.2 10/10/2011 1046   HCT 47.7 10/10/2011 1046   PLT 181 10/10/2011 1046   MCV 96.0 10/10/2011 1046   MCH 30.6 10/10/2011 1046   MCHC 31.9 10/10/2011 1046   RDW 14.6 10/10/2011 1046   LYMPHSABS 52.1* 10/10/2011 1046   MONOABS 0.9 10/10/2011 1046   EOSABS 0.3 10/10/2011 1046   BASOSABS 0.1 10/10/2011 1046      Chemistry      Component Value Date/Time   NA 142 10/10/2011 1046   K 4.3 10/10/2011 1046   CL 108 10/10/2011 1046   CO2 26 10/10/2011 1046   BUN 11 10/10/2011 1046   CREATININE 1.20 10/10/2011 1046      Component Value Date/Time   CALCIUM 9.6 10/10/2011 1046   ALKPHOS 61 10/10/2011 1046   AST 26 10/10/2011 1046   ALT 16 10/10/2011 1046   BILITOT 0.8 10/10/2011 1046      Results for ZENO, HICKEL (MRN [id number]) as of 10/12/2011 11:40  Ref. Range 04/08/2010 10:30 04/07/2011 09:45 10/10/2011 10:46  WBC Latest Range: 4.0-10.5 K/uL 44.3 (H) 59.1 (HH) 56.9 (HH)    Results for VENKAT, ANKNEY (MRN [id number]) as of 10/12/2011 11:40  Ref. Range 04/08/2010 10:30 04/07/2011 09:45 10/10/2011 10:46  Lymphocytes Absolute Latest Range: 0.7-4.0 K/uL 39.1 (H) 53.9 (H) 52.1 (H)      ASSESSMENT:  1. 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 Mild obesity  3. BPH 4. Difficult with sleep, secondary to poor sleeping pattern.   PLAN:  1. Discouraged the patient from going to bed early unless he is expecting to awake in the early AM. 2. Patient may try OTC Benadryl.  Recommended 1 tablet at HS.  If ineffective may increase to 2 tablets at HS.  If this increase is ineffective,  may follow-up with PCP. 3. I personally reviewed and went over laboratory results with the patient. 4. Patient education regarding CLL. 5. Lab work in 3 months: CBC diff 6. Lab work in 6 months: CBC diff, LDH 7. Return in 6 months for follow-up.   All questions were answered. The patient knows to call the clinic with any problems, questions or concerns. We can certainly see the patient much sooner if necessary.  The patient and plan discussed with Glenford Peers, MD and he is in agreement with the aforementioned.  Reily Treloar

## 2011-12-26 ENCOUNTER — Encounter: Payer: Self-pay | Admitting: Internal Medicine

## 2012-01-12 ENCOUNTER — Encounter (HOSPITAL_COMMUNITY): Payer: Medicare Other | Attending: Oncology

## 2012-01-12 DIAGNOSIS — C9110 Chronic lymphocytic leukemia of B-cell type not having achieved remission: Secondary | ICD-10-CM | POA: Insufficient documentation

## 2012-01-12 LAB — DIFFERENTIAL
Basophils Absolute: 0.1 10*3/uL (ref 0.0–0.1)
Basophils Relative: 0 % (ref 0–1)
Eosinophils Absolute: 0.2 10*3/uL (ref 0.0–0.7)
Eosinophils Relative: 0 % (ref 0–5)
Lymphocytes Relative: 93 % — ABNORMAL HIGH (ref 12–46)
Lymphs Abs: 50.9 10*3/uL — ABNORMAL HIGH (ref 0.7–4.0)
Monocytes Absolute: 0.5 10*3/uL (ref 0.1–1.0)
Monocytes Relative: 1 % — ABNORMAL LOW (ref 3–12)
Neutro Abs: 3.2 10*3/uL (ref 1.7–7.7)
Neutrophils Relative %: 6 % — ABNORMAL LOW (ref 43–77)

## 2012-01-12 LAB — CBC
HCT: 48 % (ref 39.0–52.0)
Hemoglobin: 15.2 g/dL (ref 13.0–17.0)
MCH: 30 pg (ref 26.0–34.0)
MCHC: 31.7 g/dL (ref 30.0–36.0)
MCV: 94.7 fL (ref 78.0–100.0)
Platelets: 184 10*3/uL (ref 150–400)
RBC: 5.07 MIL/uL (ref 4.22–5.81)
RDW: 14.6 % (ref 11.5–15.5)
WBC: 55 10*3/uL (ref 4.0–10.5)

## 2012-01-12 NOTE — Progress Notes (Signed)
Labs drawn today for cbc/diff 

## 2012-01-12 NOTE — Progress Notes (Signed)
CRITICAL VALUE ALERT  Critical value received:  WBC 55,000  Date of notification:  01/12/12  Time of notification:  1007  Critical value read back:yes  Nurse who received alert:  Rosana Berger RN  MD notified (1st page):  [id number] [phone] PA-C  Responding MD:  Jenita Seashore PA-C  Time MD responded:  1010

## 2012-04-13 ENCOUNTER — Encounter (HOSPITAL_COMMUNITY): Payer: Medicare Other | Attending: Oncology

## 2012-04-13 DIAGNOSIS — C9110 Chronic lymphocytic leukemia of B-cell type not having achieved remission: Secondary | ICD-10-CM

## 2012-04-13 LAB — CBC
HCT: 49.3 % (ref 39.0–52.0)
Hemoglobin: 15.6 g/dL (ref 13.0–17.0)
MCH: 30.1 pg (ref 26.0–34.0)
MCHC: 31.6 g/dL (ref 30.0–36.0)
MCV: 95 fL (ref 78.0–100.0)
Platelets: 208 10*3/uL (ref 150–400)
RBC: 5.19 MIL/uL (ref 4.22–5.81)
RDW: 14.4 % (ref 11.5–15.5)
WBC: 53.2 10*3/uL (ref 4.0–10.5)

## 2012-04-13 LAB — DIFFERENTIAL
Basophils Absolute: 0.1 10*3/uL (ref 0.0–0.1)
Basophils Relative: 0 % (ref 0–1)
Eosinophils Absolute: 0.3 10*3/uL (ref 0.0–0.7)
Eosinophils Relative: 1 % (ref 0–5)
Lymphocytes Relative: 92 % — ABNORMAL HIGH (ref 12–46)
Lymphs Abs: 48.8 10*3/uL — ABNORMAL HIGH (ref 0.7–4.0)
Monocytes Absolute: 0.9 10*3/uL (ref 0.1–1.0)
Monocytes Relative: 2 % — ABNORMAL LOW (ref 3–12)
Neutro Abs: 3.2 10*3/uL (ref 1.7–7.7)
Neutrophils Relative %: 6 % — ABNORMAL LOW (ref 43–77)

## 2012-04-13 LAB — LACTATE DEHYDROGENASE: LDH: 177 U/L (ref 94–250)

## 2012-04-13 NOTE — Progress Notes (Signed)
[name] presented for labwork. Labs per MD order drawn via Peripheral Line 25 gauge needle inserted in LFA  Procedure without incident.  Patient tolerated procedure well.

## 2012-04-13 NOTE — Progress Notes (Signed)
CRITICAL VALUE ALERT  Critical value received:  WBC 53.2  Date of notification: 04/12/12  Time of notification:  1020  Critical value read back:yes  Nurse who received alert:  Rosana Berger RN  Dr. Mariel Sleet notified via routing message @ 1022

## 2012-04-17 ENCOUNTER — Encounter (HOSPITAL_COMMUNITY): Payer: Self-pay | Admitting: Oncology

## 2012-04-17 ENCOUNTER — Encounter (HOSPITAL_BASED_OUTPATIENT_CLINIC_OR_DEPARTMENT_OTHER): Payer: Medicare Other | Admitting: Oncology

## 2012-04-17 VITALS — BP 125/66 | HR 72 | Temp 97.3°F | Resp 16 | Wt 183.0 lb

## 2012-04-17 DIAGNOSIS — C9110 Chronic lymphocytic leukemia of B-cell type not having achieved remission: Secondary | ICD-10-CM

## 2012-04-17 NOTE — Patient Instructions (Addendum)
[facility]  Discharge Instructions  RECOMMENDATIONS MADE BY THE CONSULTANT AND ANY TEST RESULTS WILL BE SENT TO YOUR REFERRING DOCTOR.   EXAM FINDINGS BY MD TODAY AND SIGNS AND SYMPTOMS TO REPORT TO CLINIC OR PRIMARY MD: Exam and discussion by MD.  Bonita Quin are doing well.    Your blood counts are stable.  Report recurring infections, fevers, night sweats, weight loss, etc.  MEDICATIONS PRESCRIBED: none    SPECIAL INSTRUCTIONS/FOLLOW-UP: Lab work Needed in 6 months and Return to Clinic after labs to see PA in 6 months.   I acknowledge that I have been informed and understand all the instructions given to me and received a copy. I do not have any more questions at this time, but understand that I may call the Specialty Clinic at [facility] at [phone] during business hours should I have any further questions or need assistance in obtaining follow-up care.    __________________________________________  _____________  __________ Signature of Patient or Authorized Representative            Date                   Time    __________________________________________ Nurse's Signature

## 2012-04-17 NOTE — Progress Notes (Signed)
Problem #1 CLL not in need of therapy Since he was seen here last his laboratory work is extremely stable. He still has no B. symptomatology. He is essentially fully active but has not been able to find a job. He has not tried Benadryl or the Tylenol PM is suggested when he was here last. He still falls asleep in a chair watching television and wakes up around 2:00 or 3:00 in the morning and has difficulty falling asleep. He remains fully functional however. Vital signs are recorded and are very stable. He is in acute distress. He has one lymph node left posterior cervical chain is approximate 2 cm x 1.5 cm and soft. He has one of the right posterior cervical chain as well which is approximately 1 cm x 1 cm, also soft and difficult to feel. I cannot feel lymphadenopathy in any other area. He has no obvious hepatosplenomegaly. Heart shows no murmur rub or gallop. He has a regular rhythm and rate. Lungs are clear. Bowel sounds are normal. He has no leg edema or arm edema.  We will see him back in 6 months this time since he is very stable sooner only he develop symptoms and he does know what to look

## 2012-08-13 ENCOUNTER — Other Ambulatory Visit (HOSPITAL_COMMUNITY): Payer: Self-pay | Admitting: Family Medicine

## 2012-08-13 ENCOUNTER — Ambulatory Visit (HOSPITAL_COMMUNITY)
Admission: RE | Admit: 2012-08-13 | Discharge: 2012-08-13 | Disposition: A | Discharge status: Home / Self Care | Payer: Medicare Other | Source: Ambulatory Visit | Attending: Family Medicine | Admitting: Family Medicine

## 2012-08-13 DIAGNOSIS — K8020 Calculus of gallbladder without cholecystitis without obstruction: Secondary | ICD-10-CM | POA: Insufficient documentation

## 2012-08-13 DIAGNOSIS — R109 Unspecified abdominal pain: Secondary | ICD-10-CM

## 2012-08-13 DIAGNOSIS — R599 Enlarged lymph nodes, unspecified: Secondary | ICD-10-CM | POA: Insufficient documentation

## 2012-08-13 DIAGNOSIS — R1031 Right lower quadrant pain: Secondary | ICD-10-CM | POA: Insufficient documentation

## 2012-08-13 DIAGNOSIS — Z856 Personal history of leukemia: Secondary | ICD-10-CM | POA: Insufficient documentation

## 2012-08-13 DIAGNOSIS — K5732 Diverticulitis of large intestine without perforation or abscess without bleeding: Secondary | ICD-10-CM | POA: Insufficient documentation

## 2012-08-13 MED ORDER — IOHEXOL 300 MG/ML  SOLN
100.0000 mL | Freq: Once | INTRAMUSCULAR | Status: AC | PRN
  Administered 2012-08-13: 100 mL via INTRAVENOUS

## 2012-09-17 ENCOUNTER — Encounter: Payer: Self-pay | Admitting: Internal Medicine

## 2012-09-19 ENCOUNTER — Encounter: Payer: Self-pay | Admitting: Gastroenterology

## 2012-09-19 ENCOUNTER — Ambulatory Visit (INDEPENDENT_AMBULATORY_CARE_PROVIDER_SITE_OTHER): Payer: Medicare Other | Admitting: Gastroenterology

## 2012-09-19 VITALS — BP 134/74 | HR 78 | Temp 97.9°F | Ht 69.0 in | Wt 175.0 lb

## 2012-09-19 DIAGNOSIS — Z86010 Personal history of colonic polyps: Secondary | ICD-10-CM

## 2012-09-19 DIAGNOSIS — K5732 Diverticulitis of large intestine without perforation or abscess without bleeding: Secondary | ICD-10-CM

## 2012-09-19 MED ORDER — CIPROFLOXACIN HCL 500 MG PO TABS: 500.0000 mg | ORAL_TABLET | Freq: Two times a day (BID) | ORAL | Status: DC

## 2012-09-19 NOTE — Patient Instructions (Addendum)
I suspect your diverticulitis may still be active based on your exam today. You need to take another round of antibiotics. Please finish the metronidazole tablets you already have. Take one tablet three times a day until you take them all. You need to go to [facility] and get Cipro. Take one twice a day until you take them all. We will call you next week and schedule your colonoscopy.

## 2012-09-19 NOTE — Progress Notes (Signed)
Cc PCP 

## 2012-09-19 NOTE — Progress Notes (Signed)
Primary Care Physician:  MCINNIS,ANGUS G, MD  Primary Gastroenterologist:  Michael Rourk, MD   Chief Complaint  Patient presents with  . Colonoscopy  . Diverticulitis    HPI:  [name] is a 67 y.o. male here for consideration of colonoscopy. The first part of March, he developed left lower quadrant abdominal pain. He underwent a CT of the abdomen on 08/13/2012. He had sigmoid diverticulitis without complication, unchanged multiple mildly enlarged lymph nodes related to patient's leukemia, cholelithiasis but no evidence of cholecystitis. Patient was given a prescription for Cipro and Flagyl. He appears to be confused about his medication. He has a bottle of Flagyl with him today containing more than 30 tablets and a partially used bottle of Levaquin which was given to him in January . He's not sure if he actually received the Cipro. He states he is about 69% improved with regards to left-sided abdominal pain. His abdominal pain has greatly improved but still some present. History complicated by the fact that he also developed a stomach virus a few weeks ago which she is recovering from. Denies melena, brbpr. No fever. Appetite ok.    Current Outpatient Prescriptions  Medication Sig Dispense Refill  . aspirin 81 MG tablet Take 81 mg by mouth 3 (three) times a week.        . beta carotene 25000 UNIT capsule Take 25,000 Units by mouth daily.        . Cholecalciferol (D3-1000) 1000 UNITS capsule Take 1,000 Units by mouth 2 (two) times a week.        . ezetimibe-simvastatin (VYTORIN) 10-40 MG per tablet Take 1 tablet by mouth at bedtime. Once or twice monthly       . HYDROcodone-acetaminophen (VICODIN ES) 7.5-750 MG per tablet Take 1 tablet by mouth every 6 (six) hours as needed. Takes about once monthly       . vitamin A 8000 UNIT capsule Take 8,000 Units by mouth daily.      . vitamin C (ASCORBIC ACID) 250 MG tablet Take 250 mg by mouth every other day.         No current  facility-administered medications for this visit.    Allergies as of 09/19/2012  . (No Known Allergies)    Past Medical History  Diagnosis Date  . Leukemia     cll  . Diverticulitis     08/2012 on CT  . Hypercholesteremia   . 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 BPH (benign prostatic hyperplasia) 04/08/2011  . Gallstone     Past Surgical History  Procedure Laterality Date  . Appendectomy  [id number]  . Colonoscopy  2008    RMR:Left-sided diverticula, diminutive polyp in the cecum, s/p bx Normal rectum. Tubular adenoma    Family History  Problem Relation Age of Onset  . Diabetes Mother   . Cancer Father   . Colon cancer Neg Hx     History   Social History  . Marital Status: Married    Spouse Name: N/A    Number of Children: N/A  . Years of Education: N/A   Occupational History  . Not on file.   Social History Main Topics  . Smoking status: Former Smoker  . Smokeless tobacco: Never Used  . Alcohol Use:      Comment: occasionally drinks red wine  . Drug Use: No  . Sexually Active:    Other Topics Concern  . Not on file   Social History Narrative  . No narrative   on file      ROS:  General: Negative for anorexia, weight loss, fever, chills, fatigue, weakness. Eyes: Negative for vision changes.  ENT: Negative for hoarseness, difficulty swallowing , nasal congestion. CV: Negative for chest pain, angina, palpitations, dyspnea on exertion, peripheral edema.  Respiratory: Negative for dyspnea at rest, dyspnea on exertion, cough, sputum, wheezing.  GI: See history of present illness. GU:  Negative for dysuria, hematuria, urinary incontinence, urinary frequency, nocturnal urination.  MS: Negative for joint pain, low back pain.  Derm: Negative for rash or itching.  Neuro: Negative for weakness, abnormal sensation, seizure, frequent headaches, memory loss, confusion.  Psych: Negative for anxiety, depression, suicidal ideation, hallucinations.  Endo:  Negative for unusual weight change.  Heme: Negative for bruising or bleeding. Allergy: Negative for rash or hives.    Physical Examination:  BP 134/74  Pulse 78  Temp(Src) 97.9 F (36.6 C) (Oral)  Ht 5' 9" (1.753 m)  Wt 175 lb (79.379 kg)  BMI 25.83 kg/m2   General: Well-nourished, well-developed in no acute distress.  Head: Normocephalic, atraumatic.   Eyes: Conjunctiva pink, no icterus. Mouth: Oropharyngeal mucosa moist and pink , no lesions erythema or exudate. Neck: Supple without thyromegaly, masses, or lymphadenopathy.  Lungs: Clear to auscultation bilaterally.  Heart: Regular rate and rhythm, no murmurs rubs or gallops.  Abdomen: Bowel sounds are normal, mild to moderate left lower quadrant tenderness, nondistended, no hepatosplenomegaly or masses, no abdominal bruits or    hernia , no rebound or guarding.   Rectal: not performed Extremities: No lower extremity edema. No clubbing or deformities.  Neuro: Alert and oriented x 4 , grossly normal neurologically.  Skin: Warm and dry, no rash or jaundice.   Psych: Alert and cooperative, normal mood and affect.      

## 2012-09-19 NOTE — Assessment & Plan Note (Signed)
67 year old gentleman with history of chronic lymphocytic leukemia presents for consideration of colonoscopy. He has a history of tubular adenoma removed from his colon back in 2008. About 4 weeks ago he presented to his PCP with acute diverticulitis confirmed by CT scan. Unfortunately appears the patient was confused about his medication he tells me he took the Flagyl only on an as-needed basis. He is not sure if he even had Cipro filled. On exam he has persistent left lower quadrant pain, possibly from smaoldering diverticulitis. At this point I would recommend a ten-day course of Cipro and Flagyl. He has enough Flagyl remaining to complete this therapy, I sent in a prescription for the Cipro. Discussed with patient at length the need to complete both of these medications over the next 10 days. I will discuss the case further with Dr. Jena Gauss when he returns next week. Patient will likely have a colonoscopy in about 4 weeks to allow resolution of diverticulitis. Further recommendations to follow.

## 2012-10-02 ENCOUNTER — Telehealth: Payer: Self-pay | Admitting: Gastroenterology

## 2012-10-02 NOTE — Telephone Encounter (Signed)
Discussed with Dr. Jena Gauss. OK to schedule patient for a colonoscopy after completing antibiotics. Shoot for week of May 12th.

## 2012-10-03 ENCOUNTER — Other Ambulatory Visit: Payer: Self-pay | Admitting: Internal Medicine

## 2012-10-03 ENCOUNTER — Encounter (HOSPITAL_COMMUNITY): Payer: Self-pay | Admitting: Pharmacy Technician

## 2012-10-03 DIAGNOSIS — Z86010 Personal history of colonic polyps: Secondary | ICD-10-CM

## 2012-10-03 MED ORDER — PEG 3350-KCL-NA BICARB-NACL 420 G PO SOLR: 4000.0000 mL | ORAL | Status: DC

## 2012-10-03 NOTE — Telephone Encounter (Signed)
Patient is scheduled for TCS w/RMR on May 14 at 2:00 and I have spoken to [name] as well as mailed him instructions

## 2012-10-15 ENCOUNTER — Encounter (HOSPITAL_COMMUNITY): Payer: Medicare Other | Attending: Oncology

## 2012-10-15 DIAGNOSIS — C9110 Chronic lymphocytic leukemia of B-cell type not having achieved remission: Secondary | ICD-10-CM | POA: Insufficient documentation

## 2012-10-15 LAB — CBC WITH DIFFERENTIAL/PLATELET
Basophils Absolute: 0.1 10*3/uL (ref 0.0–0.1)
Basophils Relative: 0 % (ref 0–1)
Eosinophils Absolute: 0.3 10*3/uL (ref 0.0–0.7)
Eosinophils Relative: 1 % (ref 0–5)
HCT: 47.7 % (ref 39.0–52.0)
Hemoglobin: 15.3 g/dL (ref 13.0–17.0)
Lymphocytes Relative: 92 % — ABNORMAL HIGH (ref 12–46)
Lymphs Abs: 49.6 10*3/uL — ABNORMAL HIGH (ref 0.7–4.0)
MCH: 29.9 pg (ref 26.0–34.0)
MCHC: 32.1 g/dL (ref 30.0–36.0)
MCV: 93.3 fL (ref 78.0–100.0)
Monocytes Absolute: 0.8 10*3/uL (ref 0.1–1.0)
Monocytes Relative: 2 % — ABNORMAL LOW (ref 3–12)
Neutro Abs: 2.9 10*3/uL (ref 1.7–7.7)
Neutrophils Relative %: 6 % — ABNORMAL LOW (ref 43–77)
Platelets: 199 10*3/uL (ref 150–400)
RBC: 5.11 MIL/uL (ref 4.22–5.81)
RDW: 14.8 % (ref 11.5–15.5)
WBC: 54.4 10*3/uL (ref 4.0–10.5)

## 2012-10-15 LAB — COMPREHENSIVE METABOLIC PANEL
ALT: 12 U/L (ref 0–53)
AST: 22 U/L (ref 0–37)
Albumin: 3.6 g/dL (ref 3.5–5.2)
Alkaline Phosphatase: 63 U/L (ref 39–117)
BUN: 12 mg/dL (ref 6–23)
CO2: 26 mEq/L (ref 19–32)
Calcium: 9 mg/dL (ref 8.4–10.5)
Chloride: 105 mEq/L (ref 96–112)
Creatinine, Ser: 1.18 mg/dL (ref 0.50–1.35)
GFR calc Af Amer: 71 mL/min — ABNORMAL LOW (ref >90)
GFR calc non Af Amer: 62 mL/min — ABNORMAL LOW (ref >90)
Glucose, Bld: 111 mg/dL — ABNORMAL HIGH (ref 70–99)
Potassium: 3.4 mEq/L — ABNORMAL LOW (ref 3.5–5.1)
Sodium: 140 mEq/L (ref 135–145)
Total Bilirubin: 0.5 mg/dL (ref 0.3–1.2)
Total Protein: 6.4 g/dL (ref 6.0–8.3)

## 2012-10-15 LAB — LACTATE DEHYDROGENASE: LDH: 176 U/L (ref 94–250)

## 2012-10-15 NOTE — Progress Notes (Signed)
Labs drawn today for cbc/diff,cmp,ldh 

## 2012-10-15 NOTE — Progress Notes (Signed)
[name], MD [address] [place] [address]  CLL (chronic lymphocytic leukemia)  CURRENT THERAPY: Observation  INTERVAL HISTORY: MEKHI SONN 67 y.o. male returns for  regular  visit for followup of CLL not in need of therapy.   I personally reviewed and went over laboratory results with the patient.  WBC count is stable, yet elevated as one would expect with his diagnosis with predominance of lymphocytes as expected.  Hgb and platelets are WNL however.   Mr. Butner is doing very well.  He denies any complaints and ROS questioning is negative from a hematologic standpoint.   He denies any B Symptoms including fevers, chills, night sweats, unintentional weight loss, early satiety, and decreased appetite.   I personally reviewed and went over radiographic studies with the patient.He had a CT scan in March which did show some mildly enlarged lymph nodes as would be expected.   Since he remains asymptomatic and his counts are stable and solid, will continue observation.  We will see him back in 6 months will labs.  Past Medical History  Diagnosis Date  . Leukemia     cll  . Diverticulitis     08/2012 on CT  . Hypercholesteremia   . 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 BPH (benign prostatic hyperplasia) 04/08/2011  . Gallstone     has CLL (chronic lymphocytic leukemia); BPH (benign prostatic hyperplasia); Diverticulitis of colon without hemorrhage; and Hx of adenomatous colonic polyps on his problem list.     has No Known Allergies.  Mr. Shatto had no medications administered during this visit.  Past Surgical History  Procedure Laterality Date  . Appendectomy  [id number]  . Colonoscopy  2008    [id number] diverticula, diminutive polyp in the cecum, s/p bx Normal rectum. Tubular adenoma    Denies any headaches, dizziness, double vision, fevers, chills, night sweats, nausea, vomiting, diarrhea, constipation, chest pain, heart palpitations, shortness  of breath, blood in stool, black tarry stool, urinary pain, urinary burning, urinary frequency, hematuria.   PHYSICAL EXAMINATION  ECOG PERFORMANCE STATUS: 0 - Asymptomatic  Filed Vitals:   10/16/12 1033  BP: 126/70  Pulse: 67  Temp: 97.4 F (36.3 C)  Resp: 16    GENERAL:alert, no distress, well nourished, well developed, comfortable, cooperative, obese and smiling SKIN: skin color, texture, turgor are normal, no rashes or significant lesions HEAD: Normocephalic, No masses, lesions, tenderness or abnormalities EYES: normal, Conjunctiva are pink and non-injected EARS: External ears normal OROPHARYNX:mucous membranes are moist  NECK: supple, no adenopathy, thyroid normal size, non-tender, without nodularity, no stridor, non-tender, trachea midline LYMPH:  no palpable lymphadenopathy, no hepatosplenomegaly BREAST:not examined LUNGS: clear to auscultation and percussion HEART: regular rate & rhythm, no murmurs, no gallops, S1 normal and S2 normal ABDOMEN:abdomen soft, non-tender, obese, normal bowel sounds, no masses or organomegaly and no hepatosplenomegaly BACK: Back symmetric, no curvature., No CVA tenderness EXTREMITIES:less then 2 second capillary refill, no joint deformities, effusion, or inflammation, no edema, no skin discoloration, no clubbing, no cyanosis  NEURO: alert & oriented x 3 with fluent speech, no focal motor/sensory deficits, gait normal   LABORATORY DATA: CBC    Component Value Date/Time   WBC 54.4* 10/15/2012 0937   RBC 5.11 10/15/2012 0937   HGB 15.3 10/15/2012 0937   HCT 47.7 10/15/2012 0937   PLT 199 10/15/2012 0937   MCV 93.3 10/15/2012 0937   MCH 29.9 10/15/2012 0937   MCHC 32.1 10/15/2012 0937   RDW 14.8 10/15/2012 0937  LYMPHSABS 49.6* 10/15/2012 0937   MONOABS 0.8 10/15/2012 0937   EOSABS 0.3 10/15/2012 0937   BASOSABS 0.1 10/15/2012 0937      Chemistry      Component Value Date/Time   NA 140 10/15/2012 0937   K 3.4* 10/15/2012 0937   CL 105  10/15/2012 0937   CO2 26 10/15/2012 0937   BUN 12 10/15/2012 0937   CREATININE 1.18 10/15/2012 0937      Component Value Date/Time   CALCIUM 9.0 10/15/2012 0937   ALKPHOS 63 10/15/2012 0937   AST 22 10/15/2012 0937   ALT 12 10/15/2012 0937   BILITOT 0.5 10/15/2012 0937    Results for AADON, GORELIK (MRN [id number]) as of 10/15/2012 16:59  Ref. Range 10/15/2012 09:37  LDH Latest Range: 94-250 U/L 176     RADIOGRAPHIC STUDIES:  08/13/2012  *RADIOLOGY REPORT*  Clinical Data: Right lower quadrant abdominal pain with  constipation for 3 days. History of appendectomy and leukemia.  CT ABDOMEN AND PELVIS WITH CONTRAST  Technique: Multidetector CT imaging of the abdomen and pelvis was  performed following the standard protocol during bolus  administration of intravenous contrast.  Contrast: OMNIPAQUE IOHEXOL 300 MG/ML SOLN  Comparison: Abdominal pelvic CT 03/07/2008.  Findings: The lung bases are clear. There is no pleural effusion.  A small hiatal hernia appears unchanged.  The gallbladder is contracted and contains a small calcified stone,  but demonstrates no apparent wall thickening or focal surrounding  inflammatory change. The liver, spleen, pancreas, adrenal glands  and kidneys appear normal. There is mild nonspecific perinephric  soft tissue stranding bilaterally without hydronephrosis.  Again demonstrated are multiple mildly enlarged retroperitoneal,  mesenteric and pelvic lymph nodes bilaterally. Representative  lesions include a left periaortic node measuring 1.4 cm short axis  on image 40, a 1.5 cm left pelvic sidewall node on image 72 and a  1.6 cm right external iliac node on image 74. Bilateral inguinal  adenopathy is noted. These nodes are all very similar to the prior  study.  There is progressive irregular sigmoid colon wall thickening with  new soft tissue stranding in the pericolonic fat consistent with  diverticulitis. No extraluminal fluid collection is  identified.  No inflammatory changes are seen within the right lower quadrant.  There is no evidence of bowel obstruction.  The urinary bladder and prostate gland appear unchanged. There is  moderate enlargement of the prostate gland. There are no worrisome  osseous findings.  IMPRESSION:  1. Sigmoid diverticulitis with pericolonic inflammatory change.  No evidence of perforation or abscess.  2. Multiple mildly enlarged retroperitoneal, mesenteric and pelvic  lymph nodes are again noted attributed to the patient's leukemia.  These appear similar to the prior study.  3. Cholelithiasis and gallbladder contraction. No gallbladder  wall thickening or focal surrounding inflammatory change  identified.  These results will be called to the ordering clinician or  representative by the Radiologist Assistant, and communication  documented in the PACS Dashboard.  Original Report Authenticated By: Carey Bullocks, M.D.     ASSESSMENT:  1. 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 Mild obesity  3. BPH  4. Difficult with sleep, secondary to poor sleeping pattern.   PLAN:  1. I personally reviewed and went over laboratory results with the patient. 2. I personally reviewed and went over radiographic studies with the patient. 3. Labs in 6 months: CBC diff, CMET, LDH 4. Return in 6 months for follow-up  All questions were answered.  The patient knows to call the clinic with any problems, questions or concerns. We can certainly see the patient much sooner if necessary.  The patient and plan discussed with Glenford Peers, MD and he is in agreement with the aforementioned.  Saphyra Hutt

## 2012-10-16 ENCOUNTER — Encounter (HOSPITAL_COMMUNITY): Payer: Self-pay | Admitting: Oncology

## 2012-10-16 ENCOUNTER — Encounter (HOSPITAL_BASED_OUTPATIENT_CLINIC_OR_DEPARTMENT_OTHER): Payer: Medicare Other | Admitting: Oncology

## 2012-10-16 VITALS — BP 126/70 | HR 67 | Temp 97.4°F | Resp 16 | Wt 175.5 lb

## 2012-10-16 DIAGNOSIS — C9110 Chronic lymphocytic leukemia of B-cell type not having achieved remission: Secondary | ICD-10-CM

## 2012-10-16 NOTE — Patient Instructions (Addendum)
.  [facility] Discharge Instructions  RECOMMENDATIONS MADE BY THE CONSULTANT AND ANY TEST RESULTS WILL BE SENT TO YOUR REFERRING PHYSICIAN.  EXAM FINDINGS BY THE PHYSICIAN TODAY AND SIGNS OR SYMPTOMS TO REPORT TO CLINIC OR PRIMARY PHYSICIAN: Exam good   SPECIAL INSTRUCTIONS/FOLLOW-UP: Labs in 6 months See Dr. Mariel Sleet in 6 months  Thank you for choosing [facility] to provide your oncology and hematology care.  To afford each patient quality time with our providers, please arrive at least 15 minutes before your scheduled appointment time.  With your help, our goal is to use those 15 minutes to complete the necessary work-up to ensure our physicians have the information they need to help with your evaluation and healthcare recommendations.    Effective January 1st, 2014, we ask that you re-schedule your appointment with our physicians should you arrive 10 or more minutes late for your appointment.  We strive to give you quality time with our providers, and arriving late affects you and other patients whose appointments are after yours.    Again, thank you for choosing [facility].  Our hope is that these requests will decrease the amount of time that you wait before being seen by our physicians.       _____________________________________________________________  Should you have questions after your visit to [facility], please contact our office at [phone] between the hours of 8:30 a.m. and 5:00 p.m.  Voicemails left after 4:30 p.m. will not be returned until the following business day.  For prescription refill requests, have your pharmacy contact our office with your prescription refill request.

## 2012-10-17 ENCOUNTER — Ambulatory Visit (HOSPITAL_COMMUNITY)
Admission: RE | Admit: 2012-10-17 | Discharge: 2012-10-17 | Disposition: A | Discharge status: Home / Self Care | Payer: Medicare Other | Source: Ambulatory Visit | Attending: Internal Medicine | Admitting: Internal Medicine

## 2012-10-17 ENCOUNTER — Encounter (HOSPITAL_COMMUNITY)
Admission: RE | Disposition: A | Discharge status: Home / Self Care | Payer: Self-pay | Source: Ambulatory Visit | Attending: Internal Medicine

## 2012-10-17 ENCOUNTER — Ambulatory Visit (HOSPITAL_COMMUNITY): Payer: Medicare Other | Admitting: Oncology

## 2012-10-17 ENCOUNTER — Encounter (HOSPITAL_COMMUNITY): Payer: Self-pay | Admitting: *Deleted

## 2012-10-17 DIAGNOSIS — K5730 Diverticulosis of large intestine without perforation or abscess without bleeding: Secondary | ICD-10-CM | POA: Insufficient documentation

## 2012-10-17 DIAGNOSIS — D126 Benign neoplasm of colon, unspecified: Secondary | ICD-10-CM | POA: Insufficient documentation

## 2012-10-17 DIAGNOSIS — Z79899 Other long term (current) drug therapy: Secondary | ICD-10-CM | POA: Insufficient documentation

## 2012-10-17 DIAGNOSIS — Z860100 Personal history of colon polyps, unspecified: Secondary | ICD-10-CM

## 2012-10-17 DIAGNOSIS — K8020 Calculus of gallbladder without cholecystitis without obstruction: Secondary | ICD-10-CM | POA: Insufficient documentation

## 2012-10-17 DIAGNOSIS — Z7982 Long term (current) use of aspirin: Secondary | ICD-10-CM | POA: Insufficient documentation

## 2012-10-17 DIAGNOSIS — E7800 Pure hypercholesterolemia, unspecified: Secondary | ICD-10-CM | POA: Insufficient documentation

## 2012-10-17 DIAGNOSIS — N400 Enlarged prostate without lower urinary tract symptoms: Secondary | ICD-10-CM | POA: Insufficient documentation

## 2012-10-17 DIAGNOSIS — Z87891 Personal history of nicotine dependence: Secondary | ICD-10-CM | POA: Insufficient documentation

## 2012-10-17 DIAGNOSIS — Z809 Family history of malignant neoplasm, unspecified: Secondary | ICD-10-CM | POA: Insufficient documentation

## 2012-10-17 DIAGNOSIS — C9110 Chronic lymphocytic leukemia of B-cell type not having achieved remission: Secondary | ICD-10-CM | POA: Insufficient documentation

## 2012-10-17 HISTORY — PX: COLONOSCOPY: SHX5424

## 2012-10-17 SURGERY — COLONOSCOPY
Anesthesia: Moderate Sedation

## 2012-10-17 MED ORDER — ONDANSETRON HCL 4 MG/2ML IJ SOLN
INTRAMUSCULAR | Status: DC | PRN
  Administered 2012-10-17: 4 mg via INTRAVENOUS

## 2012-10-17 MED ORDER — ONDANSETRON HCL 4 MG/2ML IJ SOLN
INTRAMUSCULAR | Status: AC
  Filled 2012-10-17: qty 2

## 2012-10-17 MED ORDER — SODIUM CHLORIDE 0.9 % IV SOLN
INTRAVENOUS | Status: DC
  Administered 2012-10-17: 14:00:00 via INTRAVENOUS

## 2012-10-17 MED ORDER — MIDAZOLAM HCL 5 MG/5ML IJ SOLN
INTRAMUSCULAR | Status: AC
  Filled 2012-10-17: qty 10

## 2012-10-17 MED ORDER — MIDAZOLAM HCL 5 MG/5ML IJ SOLN
INTRAMUSCULAR | Status: DC | PRN
  Administered 2012-10-17: 2 mg via INTRAVENOUS
  Administered 2012-10-17: 1 mg via INTRAVENOUS

## 2012-10-17 MED ORDER — MEPERIDINE HCL 100 MG/ML IJ SOLN
INTRAMUSCULAR | Status: AC
  Filled 2012-10-17: qty 1

## 2012-10-17 MED ORDER — MEPERIDINE HCL 100 MG/ML IJ SOLN
INTRAMUSCULAR | Status: DC | PRN
  Administered 2012-10-17: 50 mg via INTRAVENOUS
  Administered 2012-10-17: 25 mg via INTRAVENOUS

## 2012-10-17 MED ORDER — STERILE WATER FOR IRRIGATION IR SOLN
Status: DC | PRN
  Administered 2012-10-17: 14:00:00

## 2012-10-17 NOTE — Interval H&P Note (Signed)
History and Physical Interval Note:  10/17/2012 2:01 PM  [name]  has presented today for surgery, with the diagnosis of Hx of adenomatous colonic polyps  The various methods of treatment have been discussed with the patient and family. After consideration of risks, benefits and other options for treatment, the patient has consented to  Procedure(s) with comments: COLONOSCOPY (N/A) - 2:00 as a surgical intervention .  The patient's history has been reviewed, patient examined, no change in status, stable for surgery.  I have reviewed the patient's chart and labs.  Questions were answered to the patient's satisfaction.     [name]  Abdominal pain has resolved. Colonoscopy today per plan.The risks, benefits, limitations, alternatives and imponderables have been reviewed with the patient. Questions have been answered. All parties are agreeable.

## 2012-10-17 NOTE — Discharge Instructions (Addendum)
Colonoscopy Discharge Instructions  Read the instructions outlined below and refer to this sheet in the next few weeks. These discharge instructions provide you with general information on caring for yourself after you leave the hospital. Your doctor may also give you specific instructions. While your treatment has been planned according to the most current medical practices available, unavoidable complications occasionally occur. If you have any problems or questions after discharge, call Dr. Jena Gauss at [phone]. ACTIVITY  You may resume your regular activity, but move at a slower pace for the next 24 hours.   Take frequent rest periods for the next 24 hours.   Walking will help get rid of the air and reduce the bloated feeling in your belly (abdomen).   No driving for 24 hours (because of the medicine (anesthesia) used during the test).    Do not sign any important legal documents or operate any machinery for 24 hours (because of the anesthesia used during the test).  NUTRITION  Drink plenty of fluids.   You may resume your normal diet as instructed by your doctor.   Begin with a light meal and progress to your normal diet. Heavy or fried foods are harder to digest and may make you feel sick to your stomach (nauseated).   Avoid alcoholic beverages for 24 hours or as instructed.  MEDICATIONS  You may resume your normal medications unless your doctor tells you otherwise.  WHAT YOU CAN EXPECT TODAY  Some feelings of bloating in the abdomen.   Passage of more gas than usual.   Spotting of blood in your stool or on the toilet paper.  IF YOU HAD POLYPS REMOVED DURING THE COLONOSCOPY:  No aspirin products for 7 days or as instructed.   No alcohol for 7 days or as instructed.   Eat a soft diet for the next 24 hours.  FINDING OUT THE RESULTS OF YOUR TEST Not all test results are available during your visit. If your test results are not back during the visit, make an appointment  with your caregiver to find out the results. Do not assume everything is normal if you have not heard from your caregiver or the medical facility. It is important for you to follow up on all of your test results.  SEEK IMMEDIATE MEDICAL ATTENTION IF:  You have more than a spotting of blood in your stool.   Your belly is swollen (abdominal distention).   You are nauseated or vomiting.   You have a temperature over 101.   You have abdominal pain or discomfort that is severe or gets worse throughout the day.    Diverticulosis and polyp information provided  Further recommendations to follow pending review of pathology   Colon Polyps Polyps are lumps of extra tissue growing inside the body. Polyps can grow in the large intestine (colon). Most colon polyps are noncancerous (benign). However, some colon polyps can become cancerous over time. Polyps that are larger than a pea may be harmful. To be safe, caregivers remove and test all polyps. CAUSES  Polyps form when mutations in the genes cause your cells to grow and divide even though no more tissue is needed. RISK FACTORS There are a number of risk factors that can increase your chances of getting colon polyps. They include:  Being older than 50 years.  Family history of colon polyps or colon cancer.  Long-term colon diseases, such as colitis or Crohn disease.  Being overweight.  Smoking.  Being inactive.  Drinking too much  alcohol. SYMPTOMS  Most small polyps do not cause symptoms. If symptoms are present, they may include:  Blood in the stool. The stool may look dark red or black.  Constipation or diarrhea that lasts longer than 1 week. DIAGNOSIS People often do not know they have polyps until their caregiver finds them during a regular checkup. Your caregiver can use 4 tests to check for polyps:  Digital rectal exam. The caregiver wears gloves and feels inside the rectum. This test would find polyps only in the  rectum.  Barium enema. The caregiver puts a liquid called barium into your rectum before taking X-rays of your colon. Barium makes your colon look white. Polyps are dark, so they are easy to see in the X-ray pictures.  Sigmoidoscopy. A thin, flexible tube (sigmoidoscope) is placed into your rectum. The sigmoidoscope has a light and tiny camera in it. The caregiver uses the sigmoidoscope to look at the last third of your colon.  Colonoscopy. This test is like sigmoidoscopy, but the caregiver looks at the entire colon. This is the most common method for finding and removing polyps. TREATMENT  Any polyps will be removed during a sigmoidoscopy or colonoscopy. The polyps are then tested for cancer. PREVENTION  To help lower your risk of getting more colon polyps:  Eat plenty of fruits and vegetables. Avoid eating fatty foods.  Do not smoke.  Avoid drinking alcohol.  Exercise every day.  Lose weight if recommended by your caregiver.  Eat plenty of calcium and folate. Foods that are rich in calcium include milk, cheese, and broccoli. Foods that are rich in folate include chickpeas, kidney beans, and spinach. HOME CARE INSTRUCTIONS Keep all follow-up appointments as directed by your caregiver. You may need periodic exams to check for polyps. SEEK MEDICAL CARE IF: You notice bleeding during a bowel movement. Document Released: 02/17/2004 Document Revised: 08/15/2011 Document Reviewed: 08/02/2011 [facility] Patient Information 2013 [place], [place].   Diverticulosis Diverticulosis is a common condition that develops when small pouches (diverticula) form in the wall of the colon. The risk of diverticulosis increases with age. It happens more often in people who eat a low-fiber diet. Most individuals with diverticulosis have no symptoms. Those individuals with symptoms usually experience abdominal pain, constipation, or loose stools (diarrhea). HOME CARE INSTRUCTIONS   Increase the amount of  fiber in your diet as directed by your caregiver or dietician. This may reduce symptoms of diverticulosis.  Your caregiver may recommend taking a dietary fiber supplement.  Drink at least 6 to 8 glasses of water each day to prevent constipation.  Try not to strain when you have a bowel movement.  Your caregiver may recommend avoiding nuts and seeds to prevent complications, although this is still an uncertain benefit.  Only take over-the-counter or prescription medicines for pain, discomfort, or fever as directed by your caregiver. FOODS WITH HIGH FIBER CONTENT INCLUDE:  Fruits. Apple, peach, pear, tangerine, raisins, prunes.  Vegetables. Brussels sprouts, asparagus, broccoli, cabbage, carrot, cauliflower, romaine lettuce, spinach, summer squash, tomato, winter squash, zucchini.  Starchy Vegetables. Baked beans, kidney beans, lima beans, split peas, lentils, potatoes (with skin).  Grains. Whole wheat bread, brown rice, bran flake cereal, plain oatmeal, white rice, shredded wheat, bran muffins. SEEK IMMEDIATE MEDICAL CARE IF:   You develop increasing pain or severe bloating.  You have an oral temperature above 102 F (38.9 C), not controlled by medicine.  You develop vomiting or bowel movements that are bloody or black. Document Released: 02/18/2004 Document Revised: 08/15/2011  Document Reviewed: 10/21/2009 [facility] Patient Information 2013 [place], [place].

## 2012-10-17 NOTE — H&P (View-Only) (Signed)
Primary Care Physician:  [name], MD  Primary Gastroenterologist:  Roetta Sessions, MD   Chief Complaint  Patient presents with  . Colonoscopy  . Diverticulitis    HPI:  [name] is a 67 y.o. male here for consideration of colonoscopy. The first part of March, he developed left lower quadrant abdominal pain. He underwent a CT of the abdomen on 08/13/2012. He had sigmoid diverticulitis without complication, unchanged multiple mildly enlarged lymph nodes related to patient's leukemia, cholelithiasis but no evidence of cholecystitis. Patient was given a prescription for Cipro and Flagyl. He appears to be confused about his medication. He has a bottle of Flagyl with him today containing more than 30 tablets and a partially used bottle of Levaquin which was given to him in January . He's not sure if he actually received the Cipro. He states he is about 70% improved with regards to left-sided abdominal pain. His abdominal pain has greatly improved but still some present. History complicated by the fact that he also developed a stomach virus a few weeks ago which she is recovering from. Denies melena, brbpr. No fever. Appetite ok.    Current Outpatient Prescriptions  Medication Sig Dispense Refill  . aspirin 81 MG tablet Take 81 mg by mouth 3 (three) times a week.        . beta carotene 16109 UNIT capsule Take 25,000 Units by mouth daily.        . Cholecalciferol (D3-1000) 1000 UNITS capsule Take 1,000 Units by mouth 2 (two) times a week.        . ezetimibe-simvastatin (VYTORIN) 10-40 MG per tablet Take 1 tablet by mouth at bedtime. Once or twice monthly       . HYDROcodone-acetaminophen (VICODIN ES) 7.5-750 MG per tablet Take 1 tablet by mouth every 6 (six) hours as needed. Takes about once monthly       . vitamin A 8000 UNIT capsule Take 8,000 Units by mouth daily.      . vitamin C (ASCORBIC ACID) 250 MG tablet Take 250 mg by mouth every other day.         No current  facility-administered medications for this visit.    Allergies as of 09/19/2012  . (No Known Allergies)    Past Medical History  Diagnosis Date  . Leukemia     cll  . Diverticulitis     08/2012 on CT  . Hypercholesteremia   . 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 BPH (benign prostatic hyperplasia) 04/08/2011  . Gallstone     Past Surgical History  Procedure Laterality Date  . Appendectomy  [id number]  . Colonoscopy  2008    UEA:[id number]-[id number] diverticula, diminutive polyp in the cecum, s/p bx Normal rectum. Tubular adenoma    Family History  Problem Relation Age of Onset  . Diabetes Mother   . Cancer Father   . Colon cancer Neg Hx     History   Social History  . Marital Status: Married    Spouse Name: N/A    Number of Children: N/A  . Years of Education: N/A   Occupational History  . Not on file.   Social History Main Topics  . Smoking status: Former Games developer  . Smokeless tobacco: Never Used  . Alcohol Use:      Comment: occasionally drinks red wine  . Drug Use: No  . Sexually Active:    Other Topics Concern  . Not on file   Social History Narrative  . No narrative  on file      ROS:  General: Negative for anorexia, weight loss, fever, chills, fatigue, weakness. Eyes: Negative for vision changes.  ENT: Negative for hoarseness, difficulty swallowing , nasal congestion. CV: Negative for chest pain, angina, palpitations, dyspnea on exertion, peripheral edema.  Respiratory: Negative for dyspnea at rest, dyspnea on exertion, cough, sputum, wheezing.  GI: See history of present illness. GU:  Negative for dysuria, hematuria, urinary incontinence, urinary frequency, nocturnal urination.  MS: Negative for joint pain, low back pain.  Derm: Negative for rash or itching.  Neuro: Negative for weakness, abnormal sensation, seizure, frequent headaches, memory loss, confusion.  Psych: Negative for anxiety, depression, suicidal ideation, hallucinations.  Endo:  Negative for unusual weight change.  Heme: Negative for bruising or bleeding. Allergy: Negative for rash or hives.    Physical Examination:  BP 134/74  Pulse 78  Temp(Src) 97.9 F (36.6 C) (Oral)  Ht 5\' 9"  (1.753 m)  Wt 175 lb (79.379 kg)  BMI 25.83 kg/m2   General: Well-nourished, well-developed in no acute distress.  Head: Normocephalic, atraumatic.   Eyes: Conjunctiva pink, no icterus. Mouth: Oropharyngeal mucosa moist and pink , no lesions erythema or exudate. Neck: Supple without thyromegaly, masses, or lymphadenopathy.  Lungs: Clear to auscultation bilaterally.  Heart: Regular rate and rhythm, no murmurs rubs or gallops.  Abdomen: Bowel sounds are normal, mild to moderate left lower quadrant tenderness, nondistended, no hepatosplenomegaly or masses, no abdominal bruits or    hernia , no rebound or guarding.   Rectal: not performed Extremities: No lower extremity edema. No clubbing or deformities.  Neuro: Alert and oriented x 4 , grossly normal neurologically.  Skin: Warm and dry, no rash or jaundice.   Psych: Alert and cooperative, normal mood and affect.

## 2012-10-17 NOTE — Op Note (Signed)
[facility] [address] [place], [address]   COLONOSCOPY PROCEDURE REPORT  PATIENT: [name], [name]  MR#:         [id number] BIRTHDATE: 06/02/1946 , 66  yrs. old GENDER: Male ENDOSCOPIST: R.  Roetta Sessions, MD FACP FACG REFERRED BY:  Butch Penny, M.D.  Glenford Peers, M.D. PROCEDURE DATE:  10/17/2012 PROCEDURE:     Colonoscopy with biopsy  INDICATIONS: History of colonic adenoma; recent bout of diverticulitis-resolved with antibiotics  INFORMED CONSENT:  The risks, benefits, alternatives and imponderables including but not limited to bleeding, perforation as well as the possibility of a missed lesion have been reviewed.  The potential for biopsy, lesion removal, etc. have also been discussed.  Questions have been answered.  All parties agreeable. Please see the history and physical in the medical record for more information.  MEDICATIONS: Versed 3 mg IV and Demerol 75 mg IV in divided doses. Zofran 4 mg  DESCRIPTION OF PROCEDURE:  After a digital rectal exam was performed, the EC-[id number] ([id number])  colonoscope was advanced from the anus through the rectum and colon to the area of the cecum, ileocecal valve and appendiceal orifice.  The cecum was deeply intubated.  These structures were well-seen and photographed for the record.  From the level of the cecum and ileocecal valve, the scope was slowly and cautiously withdrawn.  The mucosal surfaces were carefully surveyed utilizing scope tip deflection to facilitate fold flattening as needed.  The scope was pulled down into the rectum where a thorough examination including retroflexion was performed.    FINDINGS:  Adequate preparation. Normal rectum. Scattered left-sided diverticula; (1) diminutive polyp in the base of the cecum; the remainder of the colonic mucosa appeared normal.  THERAPEUTIC / DIAGNOSTIC MANEUVERS PERFORMED:  The above-mentioned polyps was cold  biopsied/removed.  COMPLICATIONS: none  CECAL WITHDRAWAL TIME:  7 minutes  IMPRESSION:  Colonic diverticulosis. Colonic polyps-removed as described above  RECOMMENDATIONS: Followup on pathology.   _______________________________ eSigned:  R. Roetta Sessions, MD FACP [facility] 10/17/2012 2:25 PM   CC:    PATIENT NAME:  [name], [name] MR#: [id number]

## 2012-10-19 ENCOUNTER — Encounter (HOSPITAL_COMMUNITY): Payer: Self-pay | Admitting: Internal Medicine

## 2012-10-19 ENCOUNTER — Encounter: Payer: Self-pay | Admitting: Internal Medicine

## 2012-10-22 ENCOUNTER — Encounter: Payer: Self-pay | Admitting: Internal Medicine

## 2012-11-23 ENCOUNTER — Inpatient Hospital Stay (HOSPITAL_COMMUNITY)
Admission: EM | Admit: 2012-11-23 | Discharge: 2012-11-24 | DRG: 247 | Disposition: A | Discharge status: Home / Self Care | Payer: Medicare Other | Attending: Cardiology | Admitting: Cardiology

## 2012-11-23 ENCOUNTER — Encounter (HOSPITAL_COMMUNITY)
Admission: EM | Disposition: A | Discharge status: Home / Self Care | Payer: Self-pay | Source: Home / Self Care | Attending: Cardiology

## 2012-11-23 ENCOUNTER — Emergency Department (HOSPITAL_COMMUNITY): Payer: Medicare Other

## 2012-11-23 ENCOUNTER — Encounter (HOSPITAL_COMMUNITY): Payer: Self-pay

## 2012-11-23 DIAGNOSIS — C9110 Chronic lymphocytic leukemia of B-cell type not having achieved remission: Secondary | ICD-10-CM | POA: Diagnosis present

## 2012-11-23 DIAGNOSIS — K5732 Diverticulitis of large intestine without perforation or abscess without bleeding: Secondary | ICD-10-CM | POA: Diagnosis present

## 2012-11-23 DIAGNOSIS — I2510 Atherosclerotic heart disease of native coronary artery without angina pectoris: Secondary | ICD-10-CM

## 2012-11-23 DIAGNOSIS — Z7982 Long term (current) use of aspirin: Secondary | ICD-10-CM

## 2012-11-23 DIAGNOSIS — Z79899 Other long term (current) drug therapy: Secondary | ICD-10-CM

## 2012-11-23 DIAGNOSIS — R079 Chest pain, unspecified: Secondary | ICD-10-CM

## 2012-11-23 DIAGNOSIS — N400 Enlarged prostate without lower urinary tract symptoms: Secondary | ICD-10-CM | POA: Diagnosis present

## 2012-11-23 DIAGNOSIS — R7309 Other abnormal glucose: Secondary | ICD-10-CM | POA: Diagnosis present

## 2012-11-23 DIAGNOSIS — Z856 Personal history of leukemia: Secondary | ICD-10-CM

## 2012-11-23 DIAGNOSIS — Z87891 Personal history of nicotine dependence: Secondary | ICD-10-CM

## 2012-11-23 DIAGNOSIS — R739 Hyperglycemia, unspecified: Secondary | ICD-10-CM | POA: Diagnosis present

## 2012-11-23 DIAGNOSIS — I214 Non-ST elevation (NSTEMI) myocardial infarction: Secondary | ICD-10-CM | POA: Diagnosis present

## 2012-11-23 DIAGNOSIS — E785 Hyperlipidemia, unspecified: Secondary | ICD-10-CM | POA: Diagnosis present

## 2012-11-23 DIAGNOSIS — E7800 Pure hypercholesterolemia, unspecified: Secondary | ICD-10-CM | POA: Diagnosis present

## 2012-11-23 DIAGNOSIS — K219 Gastro-esophageal reflux disease without esophagitis: Secondary | ICD-10-CM | POA: Diagnosis present

## 2012-11-23 HISTORY — DX: Non-ST elevation (NSTEMI) myocardial infarction: I21.4

## 2012-11-23 HISTORY — PX: CORONARY STENT PLACEMENT: SHX1402

## 2012-11-23 HISTORY — DX: Major depressive disorder, single episode, unspecified: F32.9

## 2012-11-23 HISTORY — DX: Gastro-esophageal reflux disease without esophagitis: K21.9

## 2012-11-23 HISTORY — DX: Depression, unspecified: F32.A

## 2012-11-23 HISTORY — PX: LEFT HEART CATHETERIZATION WITH CORONARY ANGIOGRAM: SHX5451

## 2012-11-23 LAB — HEPARIN LEVEL (UNFRACTIONATED): Heparin Unfractionated: 0.55 IU/mL (ref 0.30–0.70)

## 2012-11-23 LAB — PHOSPHORUS: Phosphorus: 2.9 mg/dL (ref 2.3–4.6)

## 2012-11-23 LAB — COMPREHENSIVE METABOLIC PANEL
ALT: 16 U/L (ref 0–53)
AST: 30 U/L (ref 0–37)
Albumin: 3.7 g/dL (ref 3.5–5.2)
Alkaline Phosphatase: 53 U/L (ref 39–117)
BUN: 15 mg/dL (ref 6–23)
CO2: 25 mEq/L (ref 19–32)
Calcium: 9 mg/dL (ref 8.4–10.5)
Chloride: 102 mEq/L (ref 96–112)
Creatinine, Ser: 1.3 mg/dL (ref 0.50–1.35)
GFR calc Af Amer: 64 mL/min — ABNORMAL LOW (ref >90)
GFR calc non Af Amer: 55 mL/min — ABNORMAL LOW (ref >90)
Glucose, Bld: 157 mg/dL — ABNORMAL HIGH (ref 70–99)
Potassium: 3.7 mEq/L (ref 3.5–5.1)
Sodium: 139 mEq/L (ref 135–145)
Total Bilirubin: 0.7 mg/dL (ref 0.3–1.2)
Total Protein: 6.6 g/dL (ref 6.0–8.3)

## 2012-11-23 LAB — CBC WITH DIFFERENTIAL/PLATELET
Basophils Absolute: 0 10*3/uL (ref 0.0–0.1)
Basophils Absolute: 0 10*3/uL (ref 0.0–0.1)
Basophils Relative: 0 % (ref 0–1)
Basophils Relative: 0 % (ref 0–1)
Eosinophils Absolute: 0 10*3/uL (ref 0.0–0.7)
Eosinophils Absolute: 0.5 10*3/uL (ref 0.0–0.7)
Eosinophils Relative: 0 % (ref 0–5)
Eosinophils Relative: 1 % (ref 0–5)
HCT: 46.8 % (ref 39.0–52.0)
HCT: 45.8 % (ref 39.0–52.0)
Hemoglobin: 14.9 g/dL (ref 13.0–17.0)
Hemoglobin: 14.8 g/dL (ref 13.0–17.0)
Lymphocytes Relative: 91 % — ABNORMAL HIGH (ref 12–46)
Lymphocytes Relative: 89 % — ABNORMAL HIGH (ref 12–46)
Lymphs Abs: 46.2 10*3/uL — ABNORMAL HIGH (ref 0.7–4.0)
Lymphs Abs: 44.1 10*3/uL — ABNORMAL HIGH (ref 0.7–4.0)
MCH: 29.9 pg (ref 26.0–34.0)
MCH: 29.9 pg (ref 26.0–34.0)
MCHC: 31.8 g/dL (ref 30.0–36.0)
MCHC: 32.3 g/dL (ref 30.0–36.0)
MCV: 93.8 fL (ref 78.0–100.0)
MCV: 92.5 fL (ref 78.0–100.0)
Monocytes Absolute: 0.5 10*3/uL (ref 0.1–1.0)
Monocytes Absolute: 0.5 10*3/uL (ref 0.1–1.0)
Monocytes Relative: 1 % — ABNORMAL LOW (ref 3–12)
Monocytes Relative: 1 % — ABNORMAL LOW (ref 3–12)
Neutro Abs: 4.1 10*3/uL (ref 1.7–7.7)
Neutro Abs: 4.5 10*3/uL (ref 1.7–7.7)
Neutrophils Relative %: 8 % — ABNORMAL LOW (ref 43–77)
Neutrophils Relative %: 9 % — ABNORMAL LOW (ref 43–77)
Platelets: 192 10*3/uL (ref 150–400)
Platelets: 196 10*3/uL (ref 150–400)
RBC: 4.99 MIL/uL (ref 4.22–5.81)
RBC: 4.95 MIL/uL (ref 4.22–5.81)
RDW: 14.8 % (ref 11.5–15.5)
RDW: 14.9 % (ref 11.5–15.5)
WBC: 50.8 10*3/uL (ref 4.0–10.5)
WBC: 49.6 10*3/uL — ABNORMAL HIGH (ref 4.0–10.5)

## 2012-11-23 LAB — LACTATE DEHYDROGENASE: LDH: 191 U/L (ref 94–250)

## 2012-11-23 LAB — PROTIME-INR
INR: 1.06 (ref 0.00–1.49)
Prothrombin Time: 13.7 seconds (ref 11.6–15.2)

## 2012-11-23 LAB — TROPONIN I
Troponin I: 0.71 ng/mL (ref <0.30)
Troponin I: 2.95 ng/mL (ref <0.30)
Troponin I: 3.67 ng/mL (ref <0.30)
Troponin I: 3.16 ng/mL (ref <0.30)

## 2012-11-23 LAB — HEMOGLOBIN A1C
Hgb A1c MFr Bld: 5.7 % — ABNORMAL HIGH (ref <5.7)
Mean Plasma Glucose: 117 mg/dL — ABNORMAL HIGH (ref <117)

## 2012-11-23 LAB — MRSA PCR SCREENING: MRSA by PCR: NEGATIVE

## 2012-11-23 LAB — TSH: TSH: 1.336 u[IU]/mL (ref 0.350–4.500)

## 2012-11-23 LAB — MAGNESIUM: Magnesium: 1.9 mg/dL (ref 1.5–2.5)

## 2012-11-23 LAB — URIC ACID: Uric Acid, Serum: 7.1 mg/dL (ref 4.0–7.8)

## 2012-11-23 SURGERY — LEFT HEART CATHETERIZATION WITH CORONARY ANGIOGRAM
Anesthesia: LOCAL

## 2012-11-23 MED ORDER — BIVALIRUDIN 250 MG IV SOLR
INTRAVENOUS | Status: AC
  Filled 2012-11-23: qty 250

## 2012-11-23 MED ORDER — SODIUM CHLORIDE 0.9 % IV BOLUS (SEPSIS)
500.0000 mL | Freq: Once | INTRAVENOUS | Status: AC
  Administered 2012-11-23: 500 mL via INTRAVENOUS

## 2012-11-23 MED ORDER — LIDOCAINE HCL (PF) 1 % IJ SOLN
INTRAMUSCULAR | Status: AC
  Filled 2012-11-23: qty 30

## 2012-11-23 MED ORDER — HYDROMORPHONE HCL PF 1 MG/ML IJ SOLN: 1.0000 mg | INTRAMUSCULAR | Status: AC | PRN

## 2012-11-23 MED ORDER — HEPARIN SODIUM (PORCINE) 1000 UNIT/ML IJ SOLN
INTRAMUSCULAR | Status: AC
  Filled 2012-11-23: qty 1

## 2012-11-23 MED ORDER — ASPIRIN 81 MG PO CHEW
CHEWABLE_TABLET | ORAL | Status: AC
  Administered 2012-11-23: 324 mg
  Filled 2012-11-23: qty 4

## 2012-11-23 MED ORDER — SODIUM CHLORIDE 0.9 % IV SOLN: 0.2500 mg/kg/h | INTRAVENOUS | Status: DC

## 2012-11-23 MED ORDER — ASPIRIN 81 MG PO CHEW
CHEWABLE_TABLET | ORAL | Status: AC
  Filled 2012-11-23: qty 3

## 2012-11-23 MED ORDER — NITROGLYCERIN 0.4 MG SL SUBL: 0.4000 mg | SUBLINGUAL_TABLET | SUBLINGUAL | Status: DC | PRN

## 2012-11-23 MED ORDER — ACETAMINOPHEN 325 MG PO TABS: 650.0000 mg | ORAL_TABLET | ORAL | Status: DC | PRN

## 2012-11-23 MED ORDER — HEPARIN (PORCINE) IN NACL 100-0.45 UNIT/ML-% IJ SOLN
1000.0000 [IU]/h | INTRAMUSCULAR | Status: DC
  Administered 2012-11-23: 1000 [IU]/h via INTRAVENOUS
  Filled 2012-11-23 (×2): qty 250

## 2012-11-23 MED ORDER — SODIUM CHLORIDE 0.9 % IJ SOLN: 3.0000 mL | Freq: Two times a day (BID) | INTRAMUSCULAR | Status: DC

## 2012-11-23 MED ORDER — NITROGLYCERIN 2 % TD OINT
0.5000 [in_us] | TOPICAL_OINTMENT | Freq: Four times a day (QID) | TRANSDERMAL | Status: DC
  Administered 2012-11-23: 0.5 [in_us] via TOPICAL
  Filled 2012-11-23: qty 30

## 2012-11-23 MED ORDER — HEPARIN (PORCINE) IN NACL 2-0.9 UNIT/ML-% IJ SOLN
INTRAMUSCULAR | Status: AC
  Filled 2012-11-23: qty 1000

## 2012-11-23 MED ORDER — NITROGLYCERIN 0.2 MG/ML ON CALL CATH LAB
INTRAVENOUS | Status: AC
  Filled 2012-11-23: qty 1

## 2012-11-23 MED ORDER — SODIUM CHLORIDE 0.9 % IJ SOLN: 3.0000 mL | INTRAMUSCULAR | Status: DC | PRN

## 2012-11-23 MED ORDER — HEPARIN BOLUS VIA INFUSION
4000.0000 [IU] | Freq: Once | INTRAVENOUS | Status: AC
  Administered 2012-11-23: 4000 [IU] via INTRAVENOUS
  Filled 2012-11-23: qty 4000

## 2012-11-23 MED ORDER — SODIUM CHLORIDE 0.9 % IV SOLN: INTRAVENOUS | Status: DC

## 2012-11-23 MED ORDER — HYDROCODONE-ACETAMINOPHEN 7.5-325 MG PO TABS: 1.0000 | ORAL_TABLET | Freq: Four times a day (QID) | ORAL | Status: DC | PRN

## 2012-11-23 MED ORDER — PRASUGREL HCL 10 MG PO TABS
ORAL_TABLET | ORAL | Status: AC
  Filled 2012-11-23: qty 6

## 2012-11-23 MED ORDER — EZETIMIBE-SIMVASTATIN 10-40 MG PO TABS
1.0000 | ORAL_TABLET | Freq: Every day | ORAL | Status: DC
  Administered 2012-11-23: 1 via ORAL
  Filled 2012-11-23 (×2): qty 1

## 2012-11-23 MED ORDER — ONDANSETRON HCL 4 MG/2ML IJ SOLN: 4.0000 mg | Freq: Four times a day (QID) | INTRAMUSCULAR | Status: DC | PRN

## 2012-11-23 MED ORDER — VERAPAMIL HCL 2.5 MG/ML IV SOLN
INTRAVENOUS | Status: AC
  Filled 2012-11-23: qty 2

## 2012-11-23 MED ORDER — ASPIRIN 81 MG PO CHEW: 81.0000 mg | CHEWABLE_TABLET | Freq: Every day | ORAL | Status: DC

## 2012-11-23 MED ORDER — ONDANSETRON HCL 4 MG/2ML IJ SOLN: 4.0000 mg | Freq: Three times a day (TID) | INTRAMUSCULAR | Status: AC | PRN

## 2012-11-23 MED ORDER — DIAZEPAM 5 MG PO TABS
5.0000 mg | ORAL_TABLET | ORAL | Status: AC
  Administered 2012-11-23: 5 mg via ORAL
  Filled 2012-11-23: qty 1

## 2012-11-23 MED ORDER — MIDAZOLAM HCL 2 MG/2ML IJ SOLN
INTRAMUSCULAR | Status: AC
  Filled 2012-11-23: qty 2

## 2012-11-23 MED ORDER — PRASUGREL HCL 10 MG PO TABS
10.0000 mg | ORAL_TABLET | Freq: Every day | ORAL | Status: DC
  Administered 2012-11-24: 10 mg via ORAL
  Filled 2012-11-23: qty 1

## 2012-11-23 MED ORDER — MORPHINE SULFATE 2 MG/ML IJ SOLN: 1.0000 mg | INTRAMUSCULAR | Status: DC | PRN

## 2012-11-23 MED ORDER — ASPIRIN 81 MG PO CHEW
324.0000 mg | CHEWABLE_TABLET | Freq: Once | ORAL | Status: AC
  Administered 2012-11-23: 324 mg via ORAL

## 2012-11-23 MED ORDER — SODIUM CHLORIDE 0.9 % IV SOLN: 250.0000 mL | INTRAVENOUS | Status: DC | PRN

## 2012-11-23 MED ORDER — SODIUM CHLORIDE 0.9 % IV SOLN
0.2500 mg/kg/h | INTRAVENOUS | Status: AC
  Filled 2012-11-23: qty 250

## 2012-11-23 MED ORDER — ASPIRIN EC 81 MG PO TBEC
81.0000 mg | DELAYED_RELEASE_TABLET | Freq: Every day | ORAL | Status: DC
  Administered 2012-11-24: 81 mg via ORAL
  Filled 2012-11-23: qty 1

## 2012-11-23 MED ORDER — SODIUM CHLORIDE 0.9 % IV SOLN: INTRAVENOUS | Status: AC

## 2012-11-23 MED ORDER — SODIUM CHLORIDE 0.9 % IV SOLN
1.0000 mL/kg/h | INTRAVENOUS | Status: AC
  Administered 2012-11-23 (×2): 1 mL/kg/h via INTRAVENOUS

## 2012-11-23 MED ORDER — FENTANYL CITRATE 0.05 MG/ML IJ SOLN
INTRAMUSCULAR | Status: AC
  Filled 2012-11-23: qty 2

## 2012-11-23 NOTE — H&P (Signed)
Cardiology History and Physical  [name], MD  History of Present Illness (and review of medical records): [name] is a 67 y.o. male who presents for evaluation of chest pain as a transfer from [facility].  He has known hx of Chronic lymphocytic leukemia, BPH, Diverticulitis with recent flare now off antibiotics and no known hx of CAD or prior MI.  He denies HTN and DM.  He developed acute onset of throat discomfort and hot feeling around 10pm.  Then transitioned to midsternal chest pain rated 7/10 which felt like heavy pressure.  Pressure was worse with activity.  He reported additional jaw pain and headache.  Denied shortness of breath.  As symptoms did not improve, he asked his wife to call EMS.  At [facility], he had normal ecg but Trop was .71.  He was transferred for further evaluation and management.  He is now chest pain free and hemodynamically stable.  Previous diagnostic testing for coronary artery disease includes: none. Previous history of cardiac disease includes None. Coronary artery disease risk factors include: advanced age (older than 80 for men, 68 for women), dyslipidemia and male gender. Patient denies history of cardiomyopathy, coronary artery disease and valvular disease.  Review of Systems Pertinent items are noted in HPI. Further review of systems was otherwise negative other than stated in HPI.  Patient Active Problem List   Diagnosis Date Noted  . Diverticulitis of colon without hemorrhage 09/19/2012  . Hx of adenomatous colonic polyps 09/19/2012  . 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 BPH (benign prostatic hyperplasia) 04/08/2011   Past Medical History  Diagnosis Date  . Leukemia     cll  . Diverticulitis     08/2012 on CT  . Hypercholesteremia   . 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 BPH (benign prostatic hyperplasia) 04/08/2011  . Gallstone   . Leukemia   . Myocardial infarction   . Depression   . Headache(784.0)    . GERD (gastroesophageal reflux disease)     Past Surgical History  Procedure Laterality Date  . Appendectomy  [id number]  . Colonoscopy  2008    ZOX:[id number]-[id number] diverticula, diminutive polyp in the cecum, s/p bx Normal rectum. Tubular adenoma  . Colonoscopy N/A 10/17/2012    Procedure: COLONOSCOPY;  Surgeon: Corbin Ade, MD;  Location: [facility];  Service: Endoscopy;  Laterality: N/A;  2:00    Prescriptions prior to admission  Medication Sig Dispense Refill  . aspirin 81 MG tablet Take 81 mg by mouth 3 (three) times a week.        . beta carotene 81191 UNIT capsule Take 25,000 Units by mouth daily.        . Cholecalciferol (D3-1000) 1000 UNITS capsule Take 1,000 Units by mouth 2 (two) times a week.        . ciprofloxacin (CIPRO) 500 MG tablet Take 1 tablet (500 mg total) by mouth 2 (two) times daily.  20 tablet  0  . Cyanocobalamin (VITAMIN B-12) 2500 MCG SUBL Place 1 tablet under the tongue daily.      Marland Kitchen ezetimibe-simvastatin (VYTORIN) 10-40 MG per tablet Take 1 tablet by mouth at bedtime. Once or twice monthly       . HYDROcodone-acetaminophen (NORCO) 7.5-325 MG per tablet Take 1 tablet by mouth every 4 (four) hours as needed for pain.      . metroNIDAZOLE (FLAGYL) 500 MG tablet Take 500 mg by mouth daily.   30 tablet  0  . OVER THE  COUNTER MEDICATION Take 1 tablet by mouth daily. Herbal medication for prostate      . vitamin A 8000 UNIT capsule Take 8,000 Units by mouth daily.      . vitamin C (ASCORBIC ACID) 250 MG tablet Take 500 mg by mouth every other day.        No Known Allergies  History  Substance Use Topics  . Smoking status: Former Smoker    Quit date: 06/07/1963  . Smokeless tobacco: Never Used  . Alcohol Use: No     Comment: occasionally drinks red wine    Family History  Problem Relation Age of Onset  . Diabetes Mother   . Cancer Father   . Colon cancer Neg Hx      Objective:  Patient Vitals for the past 8 hrs:  BP Temp Temp src Pulse Resp SpO2 Height  Weight  11/23/12 0420 140/83 mmHg 97.9 F (36.6 C) Oral 55 13 99 % 5\' 9"  (1.753 m) 78 kg (171 lb 15.3 oz)  11/23/12 0224 118/56 mmHg - - - 18 97 % - -  11/23/12 0058 143/85 mmHg 98.2 F (36.8 C) Oral 60 20 97 % 5\' 9"  (1.753 m) 79.379 kg (175 lb)   General appearance: alert, cooperative, appears stated age and no distress Head: Normocephalic, without obvious abnormality, atraumatic Eyes: conjunctivae/corneas clear. PERRL, EOM's intact. Fundi benign. Neck: no carotid bruit, no JVD and supple, symmetrical, trachea midline Lungs: clear to auscultation bilaterally Chest wall: no tenderness Heart: regular rate and rhythm, S1, S2 normal, no murmur, click, rub or gallop Abdomen: soft, non-tender; bowel sounds normal; no masses,  no organomegaly Extremities: extremities normal, atraumatic, no cyanosis or edema Pulses: 2+ and symmetric Neurologic: Grossly normal  Results for orders placed during the hospital encounter of 11/23/12 (from the past 48 hour(s))  COMPREHENSIVE METABOLIC PANEL     Status: Abnormal   Collection Time    11/23/12 12:59 AM      Result Value Range   Sodium 139  135 - 145 mEq/L   Potassium 3.7  3.5 - 5.1 mEq/L   Chloride 102  96 - 112 mEq/L   CO2 25  19 - 32 mEq/L   Glucose, Bld 157 (*) 70 - 99 mg/dL   BUN 15  6 - 23 mg/dL   Creatinine, Ser 1.61  0.50 - 1.35 mg/dL   Calcium 9.0  8.4 - 09.6 mg/dL   Total Protein 6.6  6.0 - 8.3 g/dL   Albumin 3.7  3.5 - 5.2 g/dL   AST 30  0 - 37 U/L   ALT 16  0 - 53 U/L   Alkaline Phosphatase 53  39 - 117 U/L   Total Bilirubin 0.7  0.3 - 1.2 mg/dL   GFR calc non Af Amer 55 (*) >90 mL/min   GFR calc Af Amer 64 (*) >90 mL/min   Comment:            The eGFR has been calculated     using the CKD EPI equation.     This calculation has not been     validated in all clinical     situations.     eGFR's persistently     <90 mL/min signify     possible Chronic Kidney Disease.  TROPONIN I     Status: Abnormal   Collection Time     11/23/12 12:59 AM      Result Value Range   Troponin I 0.71 (*) <0.30 ng/mL  Comment:            Due to the release kinetics of cTnI,     a negative result within the first hours     of the onset of symptoms does not rule out     myocardial infarction with certainty.     If myocardial infarction is still suspected,     repeat the test at appropriate intervals.     CRITICAL RESULT CALLED TO, READ BACK BY AND VERIFIED WITH:     NORMAN B AT 0152 ON [id number] BY FORSYTH K  CBC WITH DIFFERENTIAL     Status: Abnormal   Collection Time    11/23/12 12:59 AM      Result Value Range   WBC 50.8 (*) 4.0 - 10.5 K/uL   Comment: RESULT REPEATED AND VERIFIED     CRITICAL RESULT CALLED TO, READ BACK BY AND VERIFIED WITH:     NORMAN B AT 0146 ON [id number] BY FORSYTH K   RBC 4.99  4.22 - 5.81 MIL/uL   Hemoglobin 14.9  13.0 - 17.0 g/dL   HCT 95.2  84.1 - 32.4 %   MCV 93.8  78.0 - 100.0 fL   MCH 29.9  26.0 - 34.0 pg   MCHC 31.8  30.0 - 36.0 g/dL   RDW 40.1  02.7 - 25.3 %   Platelets 192  150 - 400 K/uL   Neutrophils Relative % 8 (*) 43 - 77 %   Lymphocytes Relative 91 (*) 12 - 46 %   Monocytes Relative 1 (*) 3 - 12 %   Eosinophils Relative 0  0 - 5 %   Basophils Relative 0  0 - 1 %   Neutro Abs 4.1  1.7 - 7.7 K/uL   Lymphs Abs 46.2 (*) 0.7 - 4.0 K/uL   Monocytes Absolute 0.5  0.1 - 1.0 K/uL   Eosinophils Absolute 0.0  0.0 - 0.7 K/uL   Basophils Absolute 0.0  0.0 - 0.1 K/uL   WBC Morphology TOXIC GRANULATION     Comment: ATYPICAL LYMPHOCYTES     WHITE COUNT CONFIRMED ON SMEAR     SMUDGE CELLS  URIC ACID     Status: None   Collection Time    11/23/12  3:04 AM      Result Value Range   Uric Acid, Serum 7.1  4.0 - 7.8 mg/dL  LACTATE DEHYDROGENASE     Status: None   Collection Time    11/23/12  3:04 AM      Result Value Range   LDH 191  94 - 250 U/L  PHOSPHORUS     Status: None   Collection Time    11/23/12  3:04 AM      Result Value Range   Phosphorus 2.9  2.3 - 4.6 mg/dL   Dg Chest Port  1 View  11/23/2012   *RADIOLOGY REPORT*  Clinical Data: Chest pain.  PORTABLE CHEST - 1 VIEW  Comparison: No priors.  Findings: Lung volumes are low.  Mild diffuse peribronchial cuffing.  No acute consolidative air space disease.  No pleural effusions.  Pulmonary vasculature and the cardiomediastinal silhouette are within normal limits.  IMPRESSION: Mild diffuse peribronchial cuffing may suggest bronchitis.   Original Report Authenticated By: Trudie Reed, M.D.    ECG:  Sinus bradycardia, no acute ischemic changes. None prior to compare.  Assessment: 40M no prior cardiac history presents with chest pain and positive troponin, likely ACS/NSTEMI.  He has hx of CLL and  WBC appears at baseline.  Plan:  1. Cardiology  Admission  2. Continuous monitoring on Telemetry. 3. Repeat ekg on admit, prn chest pain or arrythmia 4. Trend cardiac biomarkers, check lipids, hgba1c, tsh 5. Medical management to include ASA, Heparin gtt, Statin, NTG prn 6. Will hold BB at this time as HR low 50s and BP low normal. 7. Keep NPO for possible ischemic evaluation with cardiac catheterization 8. Hydration with IVFs 9. Discussed plan with patient and wife at bedside.

## 2012-11-23 NOTE — Progress Notes (Signed)
Utilization Review Completed.   Vennie Salsbury, RN, BSN Nurse Case Manager  [phone]  

## 2012-11-23 NOTE — Progress Notes (Signed)
ANTICOAGULATION CONSULT NOTE - Initial Consult  Pharmacy Consult for heparin Indication: chest pain/ACS  No Known Allergies  Patient Measurements: Height: 5\' 9"  (175.3 cm) Weight: 171 lb 15.3 oz (78 kg) IBW/kg (Calculated) : 70.7  Vital Signs: Temp: 97.9 F (36.6 C) (06/20 0420) Temp src: Oral (06/20 0420) BP: 140/83 mmHg (06/20 0420) Pulse Rate: 55 (06/20 0420)  Labs:  Recent Labs  11/23/12 0059  HGB 14.9  HCT 46.8  PLT 192  CREATININE 1.30  TROPONINI 0.71*    Estimated Creatinine Clearance: 54.4 ml/min (by C-G formula based on Cr of 1.3).   Medical History: Past Medical History  Diagnosis Date  . Leukemia     cll  . Diverticulitis     08/2012 on CT  . Hypercholesteremia   . 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 BPH (benign prostatic hyperplasia) 04/08/2011  . Gallstone   . Leukemia   . Myocardial infarction   . Depression   . Headache(784.0)   . GERD (gastroesophageal reflux disease)     Medications:  Prescriptions prior to admission  Medication Sig Dispense Refill  . aspirin 81 MG tablet Take 81 mg by mouth 3 (three) times a week.        . beta carotene 41324 UNIT capsule Take 25,000 Units by mouth daily.        . Cholecalciferol (D3-1000) 1000 UNITS capsule Take 1,000 Units by mouth 2 (two) times a week.        . ciprofloxacin (CIPRO) 500 MG tablet Take 1 tablet (500 mg total) by mouth 2 (two) times daily.  20 tablet  0  . Cyanocobalamin (VITAMIN B-12) 2500 MCG SUBL Place 1 tablet under the tongue daily.      Marland Kitchen ezetimibe-simvastatin (VYTORIN) 10-40 MG per tablet Take 1 tablet by mouth at bedtime. Once or twice monthly       . HYDROcodone-acetaminophen (NORCO) 7.5-325 MG per tablet Take 1 tablet by mouth every 4 (four) hours as needed for pain.      . metroNIDAZOLE (FLAGYL) 500 MG tablet Take 500 mg by mouth daily.   30 tablet  0  . OVER THE COUNTER MEDICATION Take 1 tablet by mouth daily. Herbal medication for prostate      . vitamin A 8000  UNIT capsule Take 8,000 Units by mouth daily.      . vitamin C (ASCORBIC ACID) 250 MG tablet Take 500 mg by mouth every other day.        Scheduled:  . sodium chloride   Intravenous STAT  . [START ON 11/24/2012] aspirin EC  81 mg Oral Daily  . ezetimibe-simvastatin  1 tablet Oral QHS  . heparin  4,000 Units Intravenous Once   Infusions:  . sodium chloride    . heparin      Assessment: 67yo male presents to [facility] with dull mid-sternal CP that began last night and subsided, initial troponin mildly elevated, to begin heparin.  Goal of Therapy:  Heparin level 0.3-0.7 units/ml Monitor platelets by anticoagulation protocol: Yes   Plan:  Will give heparin 4000 units IV bolus x1 followed by gtt at 1000 units/hr and monitor heparin levels and CBC.  Vernard Gambles, PharmD, BCPS  11/23/2012,5:48 AM

## 2012-11-23 NOTE — CV Procedure (Signed)
CARDIAC CATHETERIZATION AND PERCUTANEOUS CORONARY INTERVENTION REPORT  NAME:  [name]   MRN: [id number] DOB:  Mar 29, 1946   ADMIT DATE: 11/23/2012 Procedure Date: 11/23/2012  INTERVENTIONAL CARDIOLOGIST: Marykay Lex, M.D., MS PRIMARY CARE PROVIDER: [name], MD PRIMARY CARDIOLOGIST:  PATIENT:  [name] is a 67 y.o. male with a PMH of CLL who was transferred from [facility] after presenting to the emergency room air with sudden onset throat discomfort that transition dementia paternal chest pain. Descries heaviness and pressure on it was worse with activity. Radiating to the jaw. He had initial troponins of 0.7 once his transfered to [facility] with a diagnosis of non-ST elevation MI. His 2013 the elevated over the course of the night, however he became chest pain-free after initial treatment with nitroglycerin and aspirin. He has remained relatively stable overnight. He was seen this morning by Dr. Nicki Guadalajara, who referred him for invasive evaluation with cardiac catheterization.  PRE-OPERATIVE DIAGNOSIS:    NSTEMI  PROCEDURES PERFORMED:    LEFT HEART CATHETERIZATION WITH NATIVE CORONARY ANGIOGRAPHY  PERCUTANEOUS CORONARY INTERVENTION ON MID RCA 95% & 70% LESIONS WITH XIENCE EXPEDITION DES 2.75 MM X 178 MM - post-dilated to 3.1 mm  PROCEDURE:Consent:  Risks of procedure as well as the alternatives and risks of each were explained to the (patient/caregiver).  Consent for procedure obtained. Consent for signed by MD and patient with RN witness -- placed on chart.   PROCEDURE: The patient was brought to the 2nd Floor [facility] in the fasting state and prepped and draped in the usual sterile fashion for Right groin or radial access. A modified Allen's test with plethysmography was performed, revealing excellent Ulnar artery collateral flow.  Sterile technique was used including antiseptics, cap, gloves, gown, hand hygiene,  mask and sheet.  Skin prep: Chlorhexidine.  Time Out: Verified patient identification, verified procedure, site/side was marked, verified correct patient position, special equipment/implants available, medications/allergies/relevent history reviewed, required imaging and test results available.  Performed  Access: Right Radial Artery; 6 Fr  Glide-Sheath Slender - Seldinger technique using an Angiocath Micropuncture Kit  10 mL IA Radial Cocktail, IV Heparin 4500 units Diagnostic:  TIG 4.0 advanced over a long exchange safety J-wire into the ascending aorta.  Left and Right Coronary Artery Angiography: TIG 4.0  LV Hemodynamics (hand-injection LV Gram): TIG 4.0  TR Band:  1845 Hours, 15 mL air  MEDICATIONS:  Anesthesia:  Local Lidocaine 2 ml  Sedation:  1 mg IV Versed, 25 mcg IV fentanyl ;   Premedication: 5 mg oral Valium  Omnipaque Contrast: 160 ml  Anticoagulation:  IV Heparin 4500 Units ; Angiomax Bolus & drip  Anti-Platelet Agent:  Effient 60 mg  Hemodynamics:  Central Aortic / Mean Pressures: 104/57 mmHg; 76 mmHg  Left Ventricular Pressures / EDP: 109/10 mmHg; 14 mmHg  Left Ventriculography:  EF: 60-65 %  Wall Motion: Possible mild basal inferior hypokinesis   Coronary Anatomy:  Left Main: Large-caliber vessel that trifurcates into the LAD, Circumflex and a small caliber Ramus Intermedius LAD: Moderate large-caliber vessel that has a mid irregular 40-50% stenosis at the bifurcation into its major diagonal branch. This stenosis continues on beyond the branch is a 50% irregular stenosis. The vessel then normalizes and tapers into a relatively small vessel that reaches the apex. Diffuse minor luminal irregularities afterwards.  D1:  smaller moderate caliber vessel with a proximal long 40% lesion involving the branch point into a first roughly 1 mm  branch vessel that has a ostial 90% stenosis (not PCI amenable). Beyond that branch 40% irregularity continues for short  period. The vessel then bifurcates distally into 2 major branches that cover large portion of the anterolateral wall.  Several other smaller diagonal branches and septal perforators are present. Left Circumflex:  large-caliber vessel with a 20% stenosis just prior to a small OM1 and. The vessel then terminates as a large lateral OM branch that has 2 major branches and into the AV groove branches. There is a roughly 30% lesion just prior to the distal bifurcation.  Ramus intermedius:  small caliber (less than 2 mm) vessel but does cover large portion of what would be at high OM distribution. There is a ostial 60% stenosis in the mid vessel area of focal bridging.   RCA: Large-caliber, dominant vessel it gives rise to a significant atrial branch. Just after the initial curve there is a focal 40-50% irregularity. The vessel then normalizes and gives rise to another major moderate caliber branches as it RV marginal branch that covers a very large distribution. Just beyond this branch there is a focal napkin ring type 95-99% stenosis followed by a hazy 60-70% lesion before the vessel then normalizes. Distally the vessel branches into what appear he 2 tandem posterior descending arteries and a very small Right Posterior AV Groove Branch (RPAV)  RPDA: The main branch is actually a second branch that reaches perhaps two thirds the way to the apex. There is several septal perforators. The proximal branch is a smaller caliber vessel that does not reach the very far along the posterior interventricular groove.   RPL Sysytem:The RPAV small-caliber vessel that gives off one small posterior lateral branch.   Percutaneous Coronary Intervention:  Sheath exchanged for 6 Fr Guide: 6 Fr   JR 4  Guidewire: Pro-water  Predilation Balloon:   Trek 2.0 mm x 12  mm;   8  Atm x 30  Sec,  Stent: Xience Expedition 2.75  mm x 18  mm; To overlap the proximal 99% lesion and the more distal 70% lesion  deployed at 12  Atm x 30   Sec,   Postdilated with stent balloon 16  Atm x 45  Sec - final diameter 3.05 mm  Post-dilation Balloon: Friendship Quantum Apex 3.0  mm x 12  mm; Positioned in the mid stent at the location the most significant significant focal lesion  18  Atm x 45  Sec  Final Diameter - 3.1 mm   Post deployment angiography in multiple views, with and without guidewire in place revealed excellent stent deployment and lesion coverage.  There was no evidence of dissection or perforation.  PATIENT DISPOSITION:    The patient was transferred to the PACU holding area in a hemodynamicaly stable, chest pain free condition.  The patient tolerated the procedure well, and there were no complications.  EBL:   < 10 ml  The patient was stable before, during, and after the procedure.  POST-OPERATIVE DIAGNOSIS:    Non-STEMI with tandem 95-99% and 70% culprit lesions in the mid RCA treated with a single Xience Expedition DES 2.75 mm x 18 mm, postdilated to roughly 3.05 to 3.1 mm.  Severe lesion in a branch of D1 that is not amenable to PCI, along with moderate lesions in the mid LAD and proximal D1.  Normal ejection fraction and normal LVEDP, not unexpected lead there is a 6 possible basal inferior hypokinesis consistent with the area in his infarction.  PLAN OF  CARE:  Continue Angiomax for 2 hours at reduced rate post cath.  Standard post radial catheterization procedure with TR band removal (may initiate inflation 60 minutes post discontinuation of Angiomax drip at reduced rate).  Monitor over night in the post procedure unit. Would anticipate discharge in the morning if stable.  Would recommend followup echocardiogram as part of his outpatient followup.  Cardiac rehabilitation consultation Phase I and Phase II.  Dual antiplatelet therapy for minimal in year.   Marykay Lex, M.D., M.S. [facility] [address] [place], [place]   [address]  [phone]  11/23/2012 7:02 PM

## 2012-11-23 NOTE — Progress Notes (Addendum)
Subjective:  No chest pain  Objective:  Vital Signs in the last 24 hours: Temp:  [97.7 F (36.5 C)-98.2 F (36.8 C)] 97.7 F (36.5 C) (06/20 0810) Pulse Rate:  [50-60] 50 (06/20 0810) Resp:  [13-20] 19 (06/20 0810) BP: (115-143)/(53-85) 115/53 mmHg (06/20 0810) SpO2:  [97 %-99 %] 97 % (06/20 0810) Weight:  [78 kg (171 lb 15.3 oz)-79.379 kg (175 lb)] 78 kg (171 lb 15.3 oz) (06/20 0420)  Intake/Output from previous day:  Intake/Output Summary (Last 24 hours) at 11/23/12 0831 Last data filed at 11/23/12 0636  Gross per 24 hour  Intake    645 ml  Output      0 ml  Net    645 ml   . sodium chloride   Intravenous STAT  . [START ON 11/24/2012] aspirin EC  81 mg Oral Daily  . diazepam  5 mg Oral On Call  . ezetimibe-simvastatin  1 tablet Oral QHS  . nitroGLYCERIN  0.5 inch Topical Q6H   . sodium chloride 75 mL/hr at 11/23/12 0636  . sodium chloride    . heparin 1,000 Units/hr (11/23/12 0636)   Physical Exam: General appearance: alert, cooperative and no distress Skin: no rashes, nl turgor HEEENT negative No JVD Lungs: clear to auscultation bilaterally Heart: regular rate and rhythm Abd: soft, BS + Rt groin without bruit No edema, negative homan's Neuro: nonfocal Psch: nl affect   Rate: 52  Rhythm: sinus bradycardia  Lab Results:  Recent Labs  11/23/12 0059  WBC 50.8*  HGB 14.9  PLT 192    Recent Labs  11/23/12 0059  NA 139  K 3.7  CL 102  CO2 25  GLUCOSE 157*  BUN 15  CREATININE 1.30    Recent Labs  11/23/12 0059  TROPONINI 0.71*   Hepatic Function Panel  Recent Labs  11/23/12 0059  PROT 6.6  ALBUMIN 3.7  AST 30  ALT 16  ALKPHOS 53  BILITOT 0.7   Lipid Panel  No results found for this basename: chol, trig, hdl, cholhdl, vldl, ldlcalc      Imaging: Imaging results have been reviewed  Cardiac Studies:  Assessment/Plan:   Principal Problem:   NSTEMI (non-ST elevated myocardial infarction) Active Problems:   CLL (chronic  lymphocytic leukemia)   Diverticulitis of colon without hemorrhage- on antibiotics for recent flair up   Dyslipidemia- currently not treated   BPH (benign prostatic hyperplasia)   Hyperglycemia   History of smoking    PLAN: Cath today. Check Hgb A1c  Corine Shelter PA-C Beeper [phone] 11/23/2012, 8:31 AM  ECG sinus bradycardia  Subsequent troponin increased now to 2.95   Patient seen and examined. Agree with assessment and plan. Pain free now, but symptoms c/w USAP. Troponin is mildly positive c/w NSTEMI. Plan cath with possible PCI today. Discussed  with pt and family who wish to proceed.   Lennette Bihari, MD, [facility] 11/23/2012 9:08 AM

## 2012-11-23 NOTE — Progress Notes (Signed)
Pt received by carelink from [employer] with wife at side.  Pt denies CP, no apparent distress, BP 140/83, SR 70's.  Oriented to unit, call light in reach.

## 2012-11-23 NOTE — ED Notes (Signed)
CRITICAL VALUE ALERT  Critical value received:  WBV 50,8  Date of notification:  11/23/12  Time of notification:  0148  Critical value read back:yes  Nurse who received alert:  BKN  MD notified (1st page):  Dr Rulon Abide  Time of first page:  0151  MD notified (2nd page):  Time of second page:  Responding MD:    Time MD responded:

## 2012-11-23 NOTE — ED Provider Notes (Signed)
History     CSN: [id number]  Arrival date & time 11/23/12  0055   First MD Initiated Contact with Patient 11/23/12 0058      Chief Complaint  Patient presents with  . Chest Pain    HPI [name] is a 67 y.o. male history of hyperlipidemia, extremely remote smoking history having smoked one pack a day in high school for 2 years, presents with midsternal chest pain that radiates to his jaw.  He said the chest pain started while watching the ball game, and study for 30-45 minutes, is dull, moderate, associated with some dizziness, nausea and a "weird feeling." Patient has no history of venous thromboembolic disease, acute coronary syndrome, coronary artery disease, has never had a stress test for coronary catheterization. Patient does have a brother with 68 years old had 2 stents.  Patient also is a pertinent medical history of CLL is being watched by Dr. Mariel Sleet.  Patient does have chronic pain in the left shoulder and the back for which he takes Vicodin 4. Patient's pain is still present, distal dull, is mild, associated with nausea vomiting, diarrhea, abdominal pain, shortness of breath, productive cough. Patient has had some mild cough recently with clear to light colored "phlegm." No fevers or chills, no hemoptysis, no recent long periods of travel, periods of immobility, treatments for cancer.   Patient also has a pertinent medical history for diverticulitis, he finished taking ciprofloxacin and metronidazole but a month ago and had a colonoscopy at that time.   Past Medical History  Diagnosis Date  . Leukemia     cll  . Diverticulitis     08/2012 on CT  . Hypercholesteremia   . 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 BPH (benign prostatic hyperplasia) 04/08/2011  . Gallstone   . Leukemia     Past Surgical History  Procedure Laterality Date  . Appendectomy  [id number]  . Colonoscopy  2008    [id number] diverticula, diminutive polyp in the cecum, s/p bx Normal  rectum. Tubular adenoma  . Colonoscopy N/A 10/17/2012    Procedure: COLONOSCOPY;  Surgeon: Corbin Ade, MD;  Location: [facility];  Service: Endoscopy;  Laterality: N/A;  2:00    Family History  Problem Relation Age of Onset  . Diabetes Mother   . Cancer Father   . Colon cancer Neg Hx     History  Substance Use Topics  . Smoking status: Former Games developer  . Smokeless tobacco: Never Used  . Alcohol Use: No     Comment: occasionally drinks red wine      Review of Systems At least 10pt or greater review of systems completed and are negative except where specified in the HPI.  Allergies  Review of patient's allergies indicates no known allergies.  Home Medications   Current Outpatient Rx  Name  Route  Sig  Dispense  Refill  . aspirin 81 MG tablet   Oral   Take 81 mg by mouth 3 (three) times a week.           . beta carotene 84696 UNIT capsule   Oral   Take 25,000 Units by mouth daily.           . Cholecalciferol (D3-1000) 1000 UNITS capsule   Oral   Take 1,000 Units by mouth 2 (two) times a week.           . ciprofloxacin (CIPRO) 500 MG tablet   Oral   Take 1 tablet (500 mg  total) by mouth 2 (two) times daily.   20 tablet   0   . Cyanocobalamin (VITAMIN B-12) 2500 MCG SUBL   Sublingual   Place 1 tablet under the tongue daily.         Marland Kitchen ezetimibe-simvastatin (VYTORIN) 10-40 MG per tablet   Oral   Take 1 tablet by mouth at bedtime. Once or twice monthly          . HYDROcodone-acetaminophen (NORCO) 7.5-325 MG per tablet   Oral   Take 1 tablet by mouth every 4 (four) hours as needed for pain.         . metroNIDAZOLE (FLAGYL) 500 MG tablet   Oral   Take 500 mg by mouth daily.    30 tablet   0   . OVER THE COUNTER MEDICATION   Oral   Take 1 tablet by mouth daily. Herbal medication for prostate         . vitamin A 8000 UNIT capsule   Oral   Take 8,000 Units by mouth daily.         . vitamin C (ASCORBIC ACID) 250 MG tablet   Oral    Take 500 mg by mouth every other day.            BP 143/85  Pulse 60  Temp(Src) 98.2 F (36.8 C) (Oral)  Resp 20  Ht 5\' 9"  (1.753 m)  Wt 175 lb (79.379 kg)  BMI 25.83 kg/m2  SpO2 97%  Physical Exam  Nursing notes reviewed.  Electronic medical record reviewed. VITAL SIGNS:   Filed Vitals:   11/23/12 0058  BP: 143/85  Pulse: 60  Temp: 98.2 F (36.8 C)  TempSrc: Oral  Resp: 20  Height: 5\' 9"  (1.753 m)  Weight: 175 lb (79.379 kg)  SpO2: 97%   CONSTITUTIONAL: Awake, oriented, appears non-toxic HENT: Atraumatic, normocephalic, oral mucosa pink and moist, airway patent. Nares patent without drainage. External ears normal. EYES: Conjunctiva clear, EOMI, PERRLA NECK: Trachea midline, non-tender, supple CARDIOVASCULAR: Normal heart rate, Normal rhythm, No murmurs, rubs, gallops PULMONARY/CHEST: Clear to auscultation, no rhonchi, wheezes, or rales. Symmetrical breath sounds. Non-tender. ABDOMINAL: Non-distended, soft, non-tender - no rebound or guarding.  BS normal. NEUROLOGIC: Non-focal, moving all four extremities, no gross sensory or motor deficits. EXTREMITIES: No clubbing, cyanosis, or edema SKIN: Warm, Dry, No erythema, No rash  ED Course  Procedures (including critical care time)  Date: 11/23/2012  Rate: 55  Rhythm: sinus bradycardia  QRS Axis: normal  Intervals: normal  ST/T Wave abnormalities: normal  Conduction Disutrbances: none  Narrative Interpretation: sinus bradcardia  Labs Reviewed  TROPONIN I - Abnormal; Notable for the following:    Troponin I 0.71 (*)    All other components within normal limits  CBC WITH DIFFERENTIAL - Abnormal; Notable for the following:    WBC 50.8 (*)    Neutrophils Relative % 8 (*)    Lymphocytes Relative 91 (*)    Monocytes Relative 1 (*)    Lymphs Abs 46.2 (*)    All other components within normal limits  COMPREHENSIVE METABOLIC PANEL  PATHOLOGIST SMEAR REVIEW   Dg Chest Port 1 View  11/23/2012   *RADIOLOGY REPORT*   Clinical Data: Chest pain.  PORTABLE CHEST - 1 VIEW  Comparison: No priors.  Findings: Lung volumes are low.  Mild diffuse peribronchial cuffing.  No acute consolidative air space disease.  No pleural effusions.  Pulmonary vasculature and the cardiomediastinal silhouette are within normal limits.  IMPRESSION: Mild diffuse  peribronchial cuffing may suggest bronchitis.   Original Report Authenticated By: Trudie Reed, M.D.     1. NSTEMI (non-ST elevated myocardial infarction)   2. Chest pain   3. CLL (chronic lymphocytic leukemia)       MDM  [name] is a 67 y.o. male presenting with resolving chest pain, however the character this patient's pain with radiation to the jaws very concerning. Initial EKG is unremarkable. Initial troponin is 0.71, in the context of hyperlipidemia and family history of cardiovascular disease, patient will be transported to [facility]. Discussed this patient's case with Dr. Terressa Koyanagi degrees, patient to be placed on step down and await further diagnostic testing.  Doubt a WBC embolism at this time, WBC is commensurate with prior values 2/2 to CLL, many smudge cells on his CBC diff, no overt blast crisis noted when I reviewed his slide.   Discussed the plan with patient,  He agrees to be transported [facility]  Patient had 324 mg aspirin today via EMS.         Jones Skene, MD 11/23/12 1610

## 2012-11-23 NOTE — Progress Notes (Signed)
ANTICOAGULATION CONSULT NOTE - Follow Up Consult  Pharmacy Consult for heparin Indication: chest pain/ACS and VTE prophylaxis  No Known Allergies  Patient Measurements: Height: 5\' 9"  (175.3 cm) Weight: 171 lb 15.3 oz (78 kg) IBW/kg (Calculated) : 70.7 Heparin Dosing Weight:   Vital Signs: Temp: 97.8 F (36.6 C) (06/20 1300) Temp src: Oral (06/20 1300) BP: 108/62 mmHg (06/20 1300) Pulse Rate: 59 (06/20 1300)  Labs:  Recent Labs  11/23/12 0059 11/23/12 0744 11/23/12 1200 11/23/12 1201  HGB 14.9 14.8  --   --   HCT 46.8 45.8  --   --   PLT 192 196  --   --   LABPROT  --   --  13.7  --   INR  --   --  1.06  --   HEPARINUNFRC  --   --  0.55  --   CREATININE 1.30  --   --   --   TROPONINI 0.71* 2.95*  --  3.67*    Estimated Creatinine Clearance: 54.4 ml/min (by C-G formula based on Cr of 1.3).   Medications:  Scheduled:  . sodium chloride   Intravenous STAT  . [START ON 11/24/2012] aspirin EC  81 mg Oral Daily  . diazepam  5 mg Oral On Call  . ezetimibe-simvastatin  1 tablet Oral QHS  . nitroGLYCERIN  0.5 inch Topical Q6H   Infusions:  . sodium chloride 75 mL/hr at 11/23/12 0800  . heparin 1,000 Units/hr (11/23/12 0636)    Assessment: 67 yo male with chest pain is currently on therapeutic heparin.  Heparin level was 0.55.  hgb 14.8 and Plt 196 K.  Will go to cath sometimes this afternoon. Goal of Therapy:  Heparin level 0.3-0.7 units/ml Monitor platelets by anticoagulation protocol: Yes   Plan:  1) Continue heparin at 1000 units/hr for now. 2) f/u after cath on plan of anticoagulation  Nicoles Sedlacek, Tsz-Yin 11/23/2012,1:46 PM

## 2012-11-23 NOTE — Progress Notes (Signed)
Received from cath lab, rt radial TR band on, level 0, + reverse allens per pleth rt thumb.  Pt denies complaints.  Dr Herbie Baltimore here, advised to run angiomax max at reduced dose x 2 hrs and deflate TR band 60 min after angiomax off. VSS. SR 60's.

## 2012-11-23 NOTE — ED Notes (Signed)
Pt c/o dull mid sternal chest pain onset last night. Pt denies pain at this time

## 2012-11-24 ENCOUNTER — Encounter (HOSPITAL_COMMUNITY): Payer: Self-pay | Admitting: *Deleted

## 2012-11-24 DIAGNOSIS — I2510 Atherosclerotic heart disease of native coronary artery without angina pectoris: Secondary | ICD-10-CM | POA: Insufficient documentation

## 2012-11-24 DIAGNOSIS — E785 Hyperlipidemia, unspecified: Secondary | ICD-10-CM

## 2012-11-24 DIAGNOSIS — R079 Chest pain, unspecified: Secondary | ICD-10-CM

## 2012-11-24 DIAGNOSIS — Z9861 Coronary angioplasty status: Secondary | ICD-10-CM

## 2012-11-24 HISTORY — DX: Atherosclerotic heart disease of native coronary artery without angina pectoris: I25.10

## 2012-11-24 LAB — LIPID PANEL
Cholesterol: 132 mg/dL (ref 0–200)
HDL: 29 mg/dL — ABNORMAL LOW (ref >39)
LDL Cholesterol: 72 mg/dL (ref 0–99)
Total CHOL/HDL Ratio: 4.6 RATIO
Triglycerides: 155 mg/dL — ABNORMAL HIGH (ref <150)
VLDL: 31 mg/dL (ref 0–40)

## 2012-11-24 LAB — CBC
HCT: 44.7 % (ref 39.0–52.0)
Hemoglobin: 14.3 g/dL (ref 13.0–17.0)
MCH: 29.5 pg (ref 26.0–34.0)
MCHC: 32 g/dL (ref 30.0–36.0)
MCV: 92.4 fL (ref 78.0–100.0)
Platelets: 172 10*3/uL (ref 150–400)
RBC: 4.84 MIL/uL (ref 4.22–5.81)
RDW: 15 % (ref 11.5–15.5)
WBC: 45.3 10*3/uL — ABNORMAL HIGH (ref 4.0–10.5)

## 2012-11-24 LAB — BASIC METABOLIC PANEL
BUN: 12 mg/dL (ref 6–23)
CO2: 23 mEq/L (ref 19–32)
Calcium: 8.4 mg/dL (ref 8.4–10.5)
Chloride: 109 mEq/L (ref 96–112)
Creatinine, Ser: 1.11 mg/dL (ref 0.50–1.35)
GFR calc Af Amer: 77 mL/min — ABNORMAL LOW (ref >90)
GFR calc non Af Amer: 66 mL/min — ABNORMAL LOW (ref >90)
Glucose, Bld: 97 mg/dL (ref 70–99)
Potassium: 4.3 mEq/L (ref 3.5–5.1)
Sodium: 137 mEq/L (ref 135–145)

## 2012-11-24 MED ORDER — NITROGLYCERIN 0.4 MG SL SUBL: 0.4000 mg | SUBLINGUAL_TABLET | SUBLINGUAL | Status: DC | PRN

## 2012-11-24 MED ORDER — PRASUGREL HCL 10 MG PO TABS: 10.0000 mg | ORAL_TABLET | Freq: Every day | ORAL | Status: DC

## 2012-11-24 MED ORDER — EZETIMIBE-SIMVASTATIN 10-40 MG PO TABS: 1.0000 | ORAL_TABLET | Freq: Every day | ORAL | Status: DC

## 2012-11-24 NOTE — Discharge Summary (Signed)
Physician Discharge Summary  Patient ID: [name] MRN: [id number] DOB/AGE: 10-Feb-1946 67 y.o.  Admit date: 11/23/2012 Discharge date: 11/24/2012  Admission Diagnoses:  NSTEMI  Discharge Diagnoses:  Principal Problem:   NSTEMI (non-ST elevated myocardial infarction) Active Problems:   CLL (chronic lymphocytic leukemia)   BPH (benign prostatic hyperplasia)   Diverticulitis of colon without hemorrhage- on antibiotics for recent flair up   Hyperglycemia   History of smoking   Dyslipidemia- currently not treated   Discharged Condition: stable  Hospital Course:   [name] is a 67 y.o. male who presents for evaluation of chest pain as a transfer from [facility]. He has known hx of Chronic lymphocytic leukemia, BPH, Diverticulitis with recent flare now off antibiotics and no known hx of CAD or prior MI. He denies HTN and DM.   He developed acute onset of throat discomfort and hot feeling around 10pm. Then transitioned to midsternal chest pain rated 7/10 which felt like heavy pressure. Pressure was worse with activity. He reported additional jaw pain and headache. Denied shortness of breath. As symptoms did not improve, he asked his wife to call EMS. At [facility], he had normal ecg but Trop was .71. He was transferred for further evaluation and management. He is now chest pain free and hemodynamically stable. Patient was started on IV heparin.   Previous diagnostic testing for coronary artery disease includes: none.  Previous history of cardiac disease includes None. Coronary artery disease risk factors include: advanced age (older than 26 for men, 23 for women), dyslipidemia and male gender. Patient denies history of cardiomyopathy, coronary artery disease and valvular disease.  Patient was taken to the Cath Lab for radial catheterization which revealed tandem 95 and 99% and 70% lesions in the mid RCA. This is treated with a single Xience Expedition DES.  Is also a severe lesion in  the number one diagonal which is not amenable to PCI. And moderate LAD lesion in the mid LAD. Ejection fraction was normal.  Patient will continue on dual antiplatelet therapy for a minimum of one year. The patient was seen by Dr. Allyson Sabal who felt he was stable for DC home.   Consults: None  Significant Diagnostic Studies: Hemodynamics:  Central Aortic / Mean Pressures: 104/57 mmHg; 76 mmHg  Left Ventricular Pressures / EDP: 109/10 mmHg; 14 mmHg Left Ventriculography:  EF: 60-65 %  Wall Motion: Possible mild basal inferior hypokinesis  Coronary Anatomy:  Left Main: Large-caliber vessel that trifurcates into the LAD, Circumflex and a small caliber Ramus Intermedius LAD: Moderate large-caliber vessel that has a mid irregular 40-50% stenosis at the bifurcation into its major diagonal branch. This stenosis continues on beyond the branch is a 50% irregular stenosis. The vessel then normalizes and tapers into a relatively small vessel that reaches the apex. Diffuse minor luminal irregularities afterwards.  D1: smaller moderate caliber vessel with a proximal long 40% lesion involving the branch point into a first roughly 1 mm branch vessel that has a ostial 90% stenosis (not PCI amenable). Beyond that branch 40% irregularity continues for short period. The vessel then bifurcates distally into 2 major branches that cover large portion of the anterolateral wall.  Several other smaller diagonal branches and septal perforators are present. Left Circumflex: large-caliber vessel with a 20% stenosis just prior to a small OM1 and. The vessel then terminates as a large lateral OM branch that has 2 major branches and into the AV groove branches. There is a roughly 30% lesion just  prior to the distal bifurcation.  Ramus intermedius: small caliber (less than 2 mm) vessel but does cover large portion of what would be at high OM distribution. There is a ostial 60% stenosis in the mid vessel area of focal bridging.  RCA:  Large-caliber, dominant vessel it gives rise to a significant atrial branch. Just after the initial curve there is a focal 40-50% irregularity. The vessel then normalizes and gives rise to another major moderate caliber branches as it RV marginal branch that covers a very large distribution. Just beyond this branch there is a focal napkin ring type 95-99% stenosis followed by a hazy 60-70% lesion before the vessel then normalizes. Distally the vessel branches into what appear he 2 tandem posterior descending arteries and a very small Right Posterior AV Groove Branch (RPAV)  RPDA: The main branch is actually a second branch that reaches perhaps two thirds the way to the apex. There is several septal perforators. The proximal branch is a smaller caliber vessel that does not reach the very far along the posterior interventricular groove.  RPL Sysytem:The RPAV small-caliber vessel that gives off one small posterior lateral branch.  Percutaneous Coronary Intervention: Sheath exchanged for 6 Fr  Guide: 6 Fr JR 4 Guidewire: Pro-water  Predilation Balloon: Trek 2.0 mm x 12 mm;  8 Atm x 30 Sec,  Stent: Xience Expedition 2.75 mm x 18 mm; To overlap the proximal 99% lesion and the more distal 70% lesion  deployed at 12 Atm x 30 Sec,  Postdilated with stent balloon 16 Atm x 45 Sec - final diameter 3.05 mm  Post-dilation Balloon:  Quantum Apex 3.0 mm x 12 mm; Positioned in the mid stent at the location the most significant significant focal lesion  18 Atm x 45 Sec  Final Diameter - 3.1 mm  Post deployment angiography in multiple views, with and without guidewire in place revealed excellent stent deployment and lesion coverage. There was no evidence of dissection or perforation.  PATIENT DISPOSITION:  The patient was transferred to the PACU holding area in a hemodynamicaly stable, chest pain free condition.  The patient tolerated the procedure well, and there were no complications. EBL: < 10 ml  The patient was  stable before, during, and after the procedure. POST-OPERATIVE DIAGNOSIS:  Non-STEMI with tandem 95-99% and 70% culprit lesions in the mid RCA treated with a single Xience Expedition DES 2.75 mm x 18 mm, postdilated to roughly 3.05 to 3.1 mm.  Severe lesion in a branch of D1 that is not amenable to PCI, along with moderate lesions in the mid LAD and proximal D1.  Normal ejection fraction and normal LVEDP, not unexpected lead there is a 6 possible basal inferior hypokinesis consistent with the area in his infarction. PLAN OF CARE:  Continue Angiomax for 2 hours at reduced rate post cath.  Standard post radial catheterization procedure with TR band removal (may initiate inflation 60 minutes post discontinuation of Angiomax drip at reduced rate).  Monitor over night in the post procedure unit. Would anticipate discharge in the morning if stable.  Would recommend followup echocardiogram as part of his outpatient followup.  Cardiac rehabilitation consultation Phase I and Phase II.  Dual antiplatelet therapy for minimal in year. Marykay Lex, M.D., M.S.  [facility]  [address]  [place], [place] [address]  [phone]  11/23/2012  CBC    Component Value Date/Time   WBC 45.3* 11/24/2012 0355   RBC 4.84 11/24/2012 0355  HGB 14.3 11/24/2012 0355   HCT 44.7 11/24/2012 0355   PLT 172 11/24/2012 0355   MCV 92.4 11/24/2012 0355   MCH 29.5 11/24/2012 0355   MCHC 32.0 11/24/2012 0355   RDW 15.0 11/24/2012 0355   LYMPHSABS 44.1* 11/23/2012 0744   MONOABS 0.5 11/23/2012 0744   EOSABS 0.5 11/23/2012 0744   BASOSABS 0.0 11/23/2012 0744    BMET    Component Value Date/Time   NA 137 11/24/2012 0355   K 4.3 11/24/2012 0355   CL 109 11/24/2012 0355   CO2 23 11/24/2012 0355   GLUCOSE 97 11/24/2012 0355   BUN 12 11/24/2012 0355   CREATININE 1.11 11/24/2012 0355   CALCIUM 8.4 11/24/2012 0355   GFRNONAA 66* 11/24/2012 0355   GFRAA 77* 11/24/2012 0355    Lipid Panel      Component Value Date/Time   CHOL 132 11/24/2012 0355   TRIG 155* 11/24/2012 0355   HDL 29* 11/24/2012 0355   CHOLHDL 4.6 11/24/2012 0355   VLDL 31 11/24/2012 0355   LDLCALC 72 11/24/2012 0355   Cardiac Panel (last 3 results)  Recent Labs  11/23/12 0744 11/23/12 1201 11/23/12 1743  TROPONINI 2.95* 3.67* 3.16*    Treatments: See above  Discharge Exam: Blood pressure 155/72, pulse 53, temperature 97.6 F (36.4 C), temperature source Oral, resp. rate 22, height 5\' 9"  (1.753 m), weight 172 lb 6.4 oz (78.2 kg), SpO2 97.00%.   Disposition: 01-Home or Self Care      Discharge Orders   Future Appointments Provider Department Dept Phone   04/15/2013 9:40 AM [facility] [phone]   04/22/2013 10:30 AM Randall An, MD [facility] [phone]   Future Orders Complete By Expires     Diet - low sodium heart healthy  As directed     Discharge instructions  As directed     Comments:      No lifting more than 1/2 gallon of milk is a right-hand or driving for 2 days    Increase activity slowly  As directed         Medication List    TAKE these medications       ARTIFICIAL TEAR SOLUTION OP  Apply 1 drop to eye as needed (dry eyes).     aspirin EC 81 MG tablet  Take 81 mg by mouth once a week.     beta carotene 84132 UNIT capsule  Take 25,000 Units by mouth once a week.     calcium carbonate 1250 MG chewable tablet  Commonly known as:  OS-CAL  Chew 1 tablet by mouth as needed (acid reflux).     D3-1000 1000 UNITS capsule  Generic drug:  Cholecalciferol  Take 1,000 Units by mouth 2 (two) times a week.     ezetimibe-simvastatin 10-40 MG per tablet  Commonly known as:  VYTORIN  Take 1 tablet by mouth at bedtime.     HYDROcodone-acetaminophen 7.5-325 MG per tablet  Commonly known as:  NORCO  Take 1 tablet by mouth every 4 (four) hours as needed for pain.     MUSCLE RUB 10-15 % Crea  Apply 1 application topically as needed (for  upper back pain).     nitroGLYCERIN 0.4 MG SL tablet  Commonly known as:  NITROSTAT  Place 1 tablet (0.4 mg total) under the tongue every 5 (five) minutes x 3 doses as needed for chest pain.     OVER THE COUNTER MEDICATION  1 tablet once  a week. Herbal supplement for prostate health.     prasugrel 10 MG Tabs  Commonly known as:  EFFIENT  Take 1 tablet (10 mg total) by mouth daily.     vitamin A 8000 UNIT capsule  Take 8,000 Units by mouth once a week.     Vitamin B-12 2500 MCG Subl  Place 1 tablet under the tongue daily.     vitamin C 500 MG tablet  Commonly known as:  ASCORBIC ACID  Take 500 mg by mouth once a week.         SignedWilburt Finlay 11/24/2012, 7:49 AM

## 2012-11-24 NOTE — Progress Notes (Signed)
TR BAND REMOVAL  LOCATION:    right radial  DEFLATED PER PROTOCOL:    yes  TIME BAND OFF / DRESSING APPLIED:    2315   SITE UPON ARRIVAL:    Level 0  SITE AFTER BAND REMOVAL:    Level 0  REVERSE ALLEN'S TEST:     Positive per pleth rt thumb  CIRCULATION SENSATION AND MOVEMENT:    Within Normal Limits   yes  COMMENTS:   See freq vs.  Written post radial cath instructions given and reviewed, pt voiced understanding

## 2012-11-24 NOTE — Progress Notes (Signed)
CARDIAC REHAB PHASE I   PRE:  Rate/Rhythm: 64 sinus rhythm  BP:  Supine:   Sitting: 149/61  Standing:    SaO2: 95% ra  MODE:  Ambulation: 574ft   POST:  Rate/Rhythem: 77 sinus rhythm  BP:  Supine:   Sitting: 167/78  Standing:    SaO2: 99% ra 0905-950 Pt ambulated in hallway without difficulty.  Slow steady gait.  Asymptomatic.  Pt education complete.  Pt and wife verbalized understanding.  Pt oriented to outpatient cardiac rehab.  At pt request referral will be sent to [facility]    [place], [place]

## 2012-11-24 NOTE — Progress Notes (Signed)
Pt ambulated 300 ft with RN, slow steady gait.  Pt c/o feeling weak.  Tele shows SR 60's with ambulation, SB 55 at rest.  BP 145/75.  [employer] MI video and education packet given, discussed when to call MD, aspirin, Effient, exercise with pt and wife.  Encouraged heart healthy diet.  Pt denies CP or SOB.

## 2012-11-24 NOTE — Progress Notes (Signed)
Subjective:  No CP/SOB  Objective:  Temp:  [97.7 F (36.5 C)-98.5 F (36.9 C)] 98.5 F (36.9 C) (06/21 0434) Pulse Rate:  [50-69] 57 (06/21 0000) Resp:  [12-28] 24 (06/21 0434) BP: (106-141)/(31-65) 111/62 mmHg (06/21 0000) SpO2:  [94 %-98 %] 94 % (06/21 0000) Weight:  [172 lb 6.4 oz (78.2 kg)] 172 lb 6.4 oz (78.2 kg) (06/21 0618) Weight change: -2 lb 9.6 oz (-1.179 kg)  Intake/Output from previous day: 06/20 0701 - 06/21 0700 In: 1319.3 [P.O.:340; I.V.:979.3] Out: 1550 [Urine:1550]  Intake/Output from this shift: Total I/O In: 719.3 [P.O.:340; I.V.:379.3] Out: 800 [Urine:800]  Physical Exam: General appearance: alert and no distress Neck: no adenopathy, no carotid bruit, no JVD, supple, symmetrical, trachea midline and thyroid not enlarged, symmetric, no tenderness/mass/nodules Lungs: clear to auscultation bilaterally Heart: regular rate and rhythm, S1, S2 normal, no murmur, click, rub or gallop Extremities: extremities normal, atraumatic, no cyanosis or edema and Right wrist OK  Lab Results: Results for orders placed during the hospital encounter of 11/23/12 (from the past 48 hour(s))  COMPREHENSIVE METABOLIC PANEL     Status: Abnormal   Collection Time    11/23/12 12:59 AM      Result Value Range   Sodium 139  135 - 145 mEq/L   Potassium 3.7  3.5 - 5.1 mEq/L   Chloride 102  96 - 112 mEq/L   CO2 25  19 - 32 mEq/L   Glucose, Bld 157 (*) 70 - 99 mg/dL   BUN 15  6 - 23 mg/dL   Creatinine, Ser 4.09  0.50 - 1.35 mg/dL   Calcium 9.0  8.4 - 81.1 mg/dL   Total Protein 6.6  6.0 - 8.3 g/dL   Albumin 3.7  3.5 - 5.2 g/dL   AST 30  0 - 37 U/L   ALT 16  0 - 53 U/L   Alkaline Phosphatase 53  39 - 117 U/L   Total Bilirubin 0.7  0.3 - 1.2 mg/dL   GFR calc non Af Amer 55 (*) >90 mL/min   GFR calc Af Amer 64 (*) >90 mL/min   Comment:            The eGFR has been calculated     using the CKD EPI equation.     This calculation has not been     validated in all clinical    situations.     eGFR's persistently     <90 mL/min signify     possible Chronic Kidney Disease.  TROPONIN I     Status: Abnormal   Collection Time    11/23/12 12:59 AM      Result Value Range   Troponin I 0.71 (*) <0.30 ng/mL   Comment:            Due to the release kinetics of cTnI,     a negative result within the first hours     of the onset of symptoms does not rule out     myocardial infarction with certainty.     If myocardial infarction is still suspected,     repeat the test at appropriate intervals.     CRITICAL RESULT CALLED TO, READ BACK BY AND VERIFIED WITH:     NORMAN B AT 0152 ON [id number] BY FORSYTH K  CBC WITH DIFFERENTIAL     Status: Abnormal   Collection Time    11/23/12 12:59 AM      Result Value Range   WBC 50.8 (*)  4.0 - 10.5 K/uL   Comment: RESULT REPEATED AND VERIFIED     CRITICAL RESULT CALLED TO, READ BACK BY AND VERIFIED WITH:     NORMAN B AT 0146 ON [id number] BY FORSYTH K   RBC 4.99  4.22 - 5.81 MIL/uL   Hemoglobin 14.9  13.0 - 17.0 g/dL   HCT 04.5  40.9 - 81.1 %   MCV 93.8  78.0 - 100.0 fL   MCH 29.9  26.0 - 34.0 pg   MCHC 31.8  30.0 - 36.0 g/dL   RDW 91.4  78.2 - 95.6 %   Platelets 192  150 - 400 K/uL   Neutrophils Relative % 8 (*) 43 - 77 %   Lymphocytes Relative 91 (*) 12 - 46 %   Monocytes Relative 1 (*) 3 - 12 %   Eosinophils Relative 0  0 - 5 %   Basophils Relative 0  0 - 1 %   Neutro Abs 4.1  1.7 - 7.7 K/uL   Lymphs Abs 46.2 (*) 0.7 - 4.0 K/uL   Monocytes Absolute 0.5  0.1 - 1.0 K/uL   Eosinophils Absolute 0.0  0.0 - 0.7 K/uL   Basophils Absolute 0.0  0.0 - 0.1 K/uL   WBC Morphology TOXIC GRANULATION     Comment: ATYPICAL LYMPHOCYTES     WHITE COUNT CONFIRMED ON SMEAR     SMUDGE CELLS  URIC ACID     Status: None   Collection Time    11/23/12  3:04 AM      Result Value Range   Uric Acid, Serum 7.1  4.0 - 7.8 mg/dL  LACTATE DEHYDROGENASE     Status: None   Collection Time    11/23/12  3:04 AM      Result Value Range   LDH 191  94 -  250 U/L  PHOSPHORUS     Status: None   Collection Time    11/23/12  3:04 AM      Result Value Range   Phosphorus 2.9  2.3 - 4.6 mg/dL  MRSA PCR SCREENING     Status: None   Collection Time    11/23/12  4:15 AM      Result Value Range   MRSA by PCR NEGATIVE  NEGATIVE   Comment:            The GeneXpert MRSA Assay (FDA     approved for NASAL specimens     only), is one component of a     comprehensive MRSA colonization     surveillance program. It is not     intended to diagnose MRSA     infection nor to guide or     monitor treatment for     MRSA infections.  TROPONIN I     Status: Abnormal   Collection Time    11/23/12  7:44 AM      Result Value Range   Troponin I 2.95 (*) <0.30 ng/mL   Comment:            Due to the release kinetics of cTnI,     a negative result within the first hours     of the onset of symptoms does not rule out     myocardial infarction with certainty.     If myocardial infarction is still suspected,     repeat the test at appropriate intervals.     CRITICAL RESULT CALLED TO, READ BACK BY AND VERIFIED WITH:     COX,L  RN @ 0919 ON 11/23/12 BY LEONARD,A  MAGNESIUM     Status: None   Collection Time    11/23/12  7:44 AM      Result Value Range   Magnesium 1.9  1.5 - 2.5 mg/dL  HEMOGLOBIN Z6X     Status: Abnormal   Collection Time    11/23/12  7:44 AM      Result Value Range   Hemoglobin A1C 5.7 (*) <5.7 %   Comment: (NOTE)                                                                               According to the ADA Clinical Practice Recommendations for 2011, when     HbA1c is used as a screening test:      >=6.5%   Diagnostic of Diabetes Mellitus               (if abnormal result is confirmed)     5.7-6.4%   Increased risk of developing Diabetes Mellitus     References:Diagnosis and Classification of Diabetes Mellitus,Diabetes     Care,2011,34(Suppl 1):S62-S69 and Standards of Medical Care in             Diabetes - 2011,Diabetes  Care,2011,34 (Suppl 1):S11-S61.   Mean Plasma Glucose 117 (*) <117 mg/dL  TSH     Status: None   Collection Time    11/23/12  7:44 AM      Result Value Range   TSH 1.336  0.350 - 4.500 uIU/mL  CBC WITH DIFFERENTIAL     Status: Abnormal   Collection Time    11/23/12  7:44 AM      Result Value Range   WBC 49.6 (*) 4.0 - 10.5 K/uL   RBC 4.95  4.22 - 5.81 MIL/uL   Hemoglobin 14.8  13.0 - 17.0 g/dL   HCT 09.6  04.5 - 40.9 %   MCV 92.5  78.0 - 100.0 fL   MCH 29.9  26.0 - 34.0 pg   MCHC 32.3  30.0 - 36.0 g/dL   RDW 81.1  91.4 - 78.2 %   Platelets 196  150 - 400 K/uL   Neutrophils Relative % 9 (*) 43 - 77 %   Lymphocytes Relative 89 (*) 12 - 46 %   Monocytes Relative 1 (*) 3 - 12 %   Eosinophils Relative 1  0 - 5 %   Basophils Relative 0  0 - 1 %   Neutro Abs 4.5  1.7 - 7.7 K/uL   Lymphs Abs 44.1 (*) 0.7 - 4.0 K/uL   Monocytes Absolute 0.5  0.1 - 1.0 K/uL   Eosinophils Absolute 0.5  0.0 - 0.7 K/uL   Basophils Absolute 0.0  0.0 - 0.1 K/uL   RBC Morphology BURR CELLS     WBC Morphology ABSOLUTE LYMPHOCYTOSIS     Comment: SMUDGE CELLS  HEPARIN LEVEL (UNFRACTIONATED)     Status: None   Collection Time    11/23/12 12:00 PM      Result Value Range   Heparin Unfractionated 0.55  0.30 - 0.70 IU/mL   Comment:            IF HEPARIN RESULTS ARE  BELOW     EXPECTED VALUES, AND PATIENT     DOSAGE HAS BEEN CONFIRMED,     SUGGEST FOLLOW UP TESTING     OF ANTITHROMBIN III LEVELS.  PROTIME-INR     Status: None   Collection Time    11/23/12 12:00 PM      Result Value Range   Prothrombin Time 13.7  11.6 - 15.2 seconds   INR 1.06  0.00 - 1.49  TROPONIN I     Status: Abnormal   Collection Time    11/23/12 12:01 PM      Result Value Range   Troponin I 3.67 (*) <0.30 ng/mL   Comment:            Due to the release kinetics of cTnI,     a negative result within the first hours     of the onset of symptoms does not rule out     myocardial infarction with certainty.     If myocardial  infarction is still suspected,     repeat the test at appropriate intervals.     CRITICAL VALUE NOTED.  VALUE IS CONSISTENT WITH PREVIOUSLY REPORTED AND CALLED VALUE.  TROPONIN I     Status: Abnormal   Collection Time    11/23/12  5:43 PM      Result Value Range   Troponin I 3.16 (*) <0.30 ng/mL   Comment:            Due to the release kinetics of cTnI,     a negative result within the first hours     of the onset of symptoms does not rule out     myocardial infarction with certainty.     If myocardial infarction is still suspected,     repeat the test at appropriate intervals.     CRITICAL VALUE NOTED.  VALUE IS CONSISTENT WITH PREVIOUSLY REPORTED AND CALLED VALUE.  CBC     Status: Abnormal   Collection Time    11/24/12  3:55 AM      Result Value Range   WBC 45.3 (*) 4.0 - 10.5 K/uL   RBC 4.84  4.22 - 5.81 MIL/uL   Hemoglobin 14.3  13.0 - 17.0 g/dL   HCT 16.1  09.6 - 04.5 %   MCV 92.4  78.0 - 100.0 fL   MCH 29.5  26.0 - 34.0 pg   MCHC 32.0  30.0 - 36.0 g/dL   RDW 40.9  81.1 - 91.4 %   Platelets 172  150 - 400 K/uL  BASIC METABOLIC PANEL     Status: Abnormal   Collection Time    11/24/12  3:55 AM      Result Value Range   Sodium 137  135 - 145 mEq/L   Potassium 4.3  3.5 - 5.1 mEq/L   Chloride 109  96 - 112 mEq/L   CO2 23  19 - 32 mEq/L   Glucose, Bld 97  70 - 99 mg/dL   BUN 12  6 - 23 mg/dL   Creatinine, Ser 7.82  0.50 - 1.35 mg/dL   Calcium 8.4  8.4 - 95.6 mg/dL   GFR calc non Af Amer 66 (*) >90 mL/min   GFR calc Af Amer 77 (*) >90 mL/min   Comment:            The eGFR has been calculated     using the CKD EPI equation.     This calculation has not been  validated in all clinical     situations.     eGFR's persistently     <90 mL/min signify     possible Chronic Kidney Disease.  LIPID PANEL     Status: Abnormal   Collection Time    11/24/12  3:55 AM      Result Value Range   Cholesterol 132  0 - 200 mg/dL   Triglycerides 161 (*) <150 mg/dL   HDL 29 (*)  >09 mg/dL   Total CHOL/HDL Ratio 4.6     VLDL 31  0 - 40 mg/dL   LDL Cholesterol 72  0 - 99 mg/dL   Comment:            Total Cholesterol/HDL:CHD Risk     Coronary Heart Disease Risk Table                         Men   Women      1/2 Average Risk   3.4   3.3      Average Risk       5.0   4.4      2 X Average Risk   9.6   7.1      3 X Average Risk  23.4   11.0                Use the calculated Patient Ratio     above and the CHD Risk Table     to determine the patient's CHD Risk.                ATP III CLASSIFICATION (LDL):      <100     mg/dL   Optimal      604-540  mg/dL   Near or Above                        Optimal      130-159  mg/dL   Borderline      981-191  mg/dL   High      >478     mg/dL   Very High    Imaging: Imaging results have been reviewed  Assessment/Plan:   1. Principal Problem: 2.   NSTEMI (non-ST elevated myocardial infarction) 3. Active Problems: 4.   CLL (chronic lymphocytic leukemia) 5.   BPH (benign prostatic hyperplasia) 6.   Diverticulitis of colon without hemorrhage- on antibiotics for recent flair up 7.   Hyperglycemia 8.   History of smoking 9.   Dyslipidemia- currently not treated 10.   Time Spent Directly with Patient:  20 minutes  Length of Stay:  LOS: 1 day   POD # 1 NSTEMI s/p PCI/Stent dominant RCA. Trop 3. Other labs OK except for increased WBC (CLL). OK for D/C home on DAPT. ROV with Dr. Dwaine Deter in [place].  Runell Gess 11/24/2012, 6:33 AM

## 2012-11-26 LAB — PATHOLOGIST SMEAR REVIEW

## 2012-11-26 LAB — POCT ACTIVATED CLOTTING TIME: Activated Clotting Time: 723 seconds

## 2012-11-26 MED FILL — Dextrose Inj 5%: INTRAVENOUS | Qty: 1000 | Status: AC

## 2012-11-27 ENCOUNTER — Telehealth: Payer: Self-pay | Admitting: Cardiology

## 2012-11-27 NOTE — Telephone Encounter (Signed)
Returned call.  Pt wanted to know when he can get back to his normal activity.  Reviewed discharge summary.  Pt informed he is supposed to increase activity slowly, not lift > 1/2 gallon of milk in R hand and not drive x 2 days.  Pt verbalized understanding.  Pt also asked when he is supposed to take the bandage off.  Pt informed notes indicate he was advised on what to do post radial cath and he verbalized understanding.  Pt stated he was told to take the bandage off when it started to come up around the edges.  Stated it's up a little, but not much.  Pt advised to follow instructions given and informed he can wait another day if the bandage has not come up enough.  Pt verbalized understanding and agreed w/ plan.  Pt will discuss activity at f/u appt on July 7th.

## 2012-11-27 NOTE — Telephone Encounter (Signed)
Mr.Fuhrman is calling because he wants to know when can he resume regular daily routines.. Please call

## 2012-12-08 ENCOUNTER — Encounter: Payer: Self-pay | Admitting: Cardiology

## 2012-12-10 ENCOUNTER — Encounter: Payer: Self-pay | Admitting: Cardiology

## 2012-12-10 ENCOUNTER — Ambulatory Visit (INDEPENDENT_AMBULATORY_CARE_PROVIDER_SITE_OTHER): Payer: Medicare Other | Admitting: Cardiology

## 2012-12-10 VITALS — BP 116/70 | HR 58 | Ht 69.0 in | Wt 172.5 lb

## 2012-12-10 DIAGNOSIS — E785 Hyperlipidemia, unspecified: Secondary | ICD-10-CM

## 2012-12-10 DIAGNOSIS — I2510 Atherosclerotic heart disease of native coronary artery without angina pectoris: Secondary | ICD-10-CM

## 2012-12-10 DIAGNOSIS — I214 Non-ST elevation (NSTEMI) myocardial infarction: Secondary | ICD-10-CM

## 2012-12-10 DIAGNOSIS — Z9861 Coronary angioplasty status: Secondary | ICD-10-CM

## 2012-12-10 MED ORDER — PRASUGREL HCL 10 MG PO TABS: 10.0000 mg | ORAL_TABLET | Freq: Every day | ORAL | Status: DC

## 2012-12-10 NOTE — Patient Instructions (Addendum)
Your physician recommends that you schedule a follow-up appointment in: 3 months.   No serious/strenuous exertion until the end of July. (no mowing lawns, weed-eating). Cardiac Rehab will let you know when you are able to progress your activity.

## 2012-12-14 ENCOUNTER — Encounter (HOSPITAL_COMMUNITY)
Admission: RE | Admit: 2012-12-14 | Discharge: 2012-12-14 | Disposition: A | Discharge status: Home / Self Care | Payer: Medicare Other | Source: Ambulatory Visit | Attending: Oncology | Admitting: Oncology

## 2012-12-14 ENCOUNTER — Encounter (HOSPITAL_COMMUNITY): Payer: Self-pay

## 2012-12-14 VITALS — BP 122/78 | HR 60 | Ht 69.0 in | Wt 170.0 lb

## 2012-12-14 DIAGNOSIS — C9110 Chronic lymphocytic leukemia of B-cell type not having achieved remission: Secondary | ICD-10-CM | POA: Insufficient documentation

## 2012-12-14 DIAGNOSIS — Z955 Presence of coronary angioplasty implant and graft: Secondary | ICD-10-CM

## 2012-12-14 DIAGNOSIS — I219 Acute myocardial infarction, unspecified: Secondary | ICD-10-CM

## 2012-12-14 NOTE — Patient Instructions (Signed)
Pt has finished orientation and is scheduled to start CR on 12/17/12 at 11 am. Pt has been instructed to arrive to class 15 minutes early for scheduled class. Pt has been instructed to wear comfortable clothing and shoes with rubber soles. Pt has been told to take their medications 1 hour prior to coming to class.  If the patient is not going to attend class, he/she has been instructed to call.

## 2012-12-14 NOTE — Progress Notes (Signed)
Patient was referred by Dr. Bryan Lemma for Coronary Stent Placement V45.82 and MI 410.91. During orientation advised patient on arrival and appointment times what to wear, what to do before, during and after exercise. Reviewed attendance and class policy. Talked about inclement weather and class consultation policy. Pt is scheduled to start Cardiac Rehab on 12/17/12 at 11 am. Pt was advised to come to class 5 minutes before class starts. He was also given instructions on meeting with the dietician and attending the Family Structure classes. Pt is eager to get started.

## 2012-12-17 ENCOUNTER — Encounter (HOSPITAL_COMMUNITY)
Admission: RE | Admit: 2012-12-17 | Discharge: 2012-12-17 | Disposition: A | Discharge status: Home / Self Care | Payer: Medicare Other | Source: Ambulatory Visit | Attending: Cardiology | Admitting: Cardiology

## 2012-12-17 ENCOUNTER — Encounter: Payer: Self-pay | Admitting: Cardiology

## 2012-12-17 NOTE — Assessment & Plan Note (Addendum)
No further anginal symptoms. No heart symptoms. ECG shows evolutionary changes. He is now quite familiar with his anginal symptoms in the future.he is due to start cardiac rehabilitation within the next few weeks.  I advised him strongly the following recommendations: No serious/strenuous exertion until the end of July. (no mowing lawns, weed-eating). Cardiac Rehab will let him inknow when he is able to progress your activity.

## 2012-12-17 NOTE — Assessment & Plan Note (Signed)
This is being followed by his primary physician. His goal now is in LDL less than 70, HDL greater than 40.

## 2012-12-17 NOTE — Assessment & Plan Note (Signed)
Stable. On dual antiplatelet therapy. We'll get his first month of Effient 3 with his car, and then he has the discount card for the rest of the year. After that we'll strongly consider switching over to Plavix for maintenance. He is on statin and Vytorin. He is not on beta blocker or ACE inhibitor due to low blood pressures in the hospital.  With his preserved ejection fraction on that is eager to jump to put him on medications. However his blood pressure were to increase over the next few months for see him back, but I would then consider adding an ACE inhibitor. He is on L-arginine which actually is a pressure patch oxide production which would provide some antianginal effect.

## 2012-12-17 NOTE — Progress Notes (Signed)
Patient ID: [name], male   DOB: [birth year], 67 y.o.   MRN: [id number]  Clinic Note: HPI: [name] is a 67 y.o. male with a PMH below who presents today for followup from his recent hospitalization for non-ST elevation MI where he underwent DC it to his RCA on June 21. He was admitted on the 20th. His history of CLL as well as hypercholesterolemia, and GERD as well as gallstones.  He was discharged in following day after his PCI and was relatively stable. He was on dual antiplatelet therapy with aspirin and Effient. He was continued on his home dose of Vytorin. He was not placed on beta blocker due to bradycardia in the hospital. His blood pressure was relatively low, precluding the addition of an ACE inhibitor.  Interval History: Since his discharge he doing quite well. He is not had any episodes of any chest pain or shortness of breath with rest or exertion.  In fact, he's actually been out: [name] and doing weed whacking he has not had any symptoms. He denies any dyspnea with rest or exertion. No lightheadedness, dizziness or wooziness, syncope/near-syncope. No TIA or amaurosis fugax-type symptoms. No melena, hematochezia or hematuria. He has had some mild bruising but nothing overly significant. No nocturia. No nosebleeds.  He is here to get backinto activity. He is not sure if these could be cardiac rehabilitation or not. He is strongly considering it. His wife is concerned as he may be doing too much.  Past Medical History  Diagnosis Date  . Diverticulitis     cll  . Hypercholesteremia   . 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 BPH (benign prostatic hyperplasia) 04/08/2011  . Gallstone   . Depression   . Headache(784.0)   . GERD (gastroesophageal reflux disease)   . Myocardial infarction 11/23/12    RCA lesion - PCI with DES; moderate LAD disease  . CAD (coronary artery disease), native coronary artery     Prior Cardiac Evaluation and Past Surgical History: Past  Surgical History  Procedure Laterality Date  . Appendectomy  [id number]  . Colonoscopy  2008    WUJ:[id number]-[id number] diverticula, diminutive polyp in the cecum, s/p bx Normal rectum. Tubular adenoma  . Colonoscopy N/A 10/17/2012    Procedure: COLONOSCOPY;  Surgeon: Corbin Ade, MD;  Location: [facility];  Service: Endoscopy;  Laterality: N/A;  2:00  . Cardiac catheterization  11/24/12    NSTEMI: Culprit = mid RCA 95% & 75%; LAD ~40-50%, Small RI ~60%; EF ~60%, mild inf-basal HK  . Coronary stent placement  11/24/12    mid RCA -- Xience eXp - 2.75 mm x 18 mm DES (3.35mm) , Dr. Herbie Baltimore    No Known Allergies  Current Outpatient Prescriptions  Medication Sig Dispense Refill  . ARTIFICIAL TEAR SOLUTION OP Apply 1 drop to eye as needed (dry eyes).      Marland Kitchen aspirin EC 81 MG tablet Take 81 mg by mouth daily.       . beta carotene 95621 UNIT capsule Take 25,000 Units by mouth once a week.      . Cholecalciferol (D3-1000) 1000 UNITS capsule Take 1,000 Units by mouth 2 (two) times a week.        . ciprofloxacin (CIPRO) 500 MG tablet Take 500 mg by mouth 2 (two) times daily.      . Cyanocobalamin (VITAMIN B-12) 2500 MCG SUBL Place 1 tablet under the tongue daily.      Marland Kitchen ezetimibe-simvastatin (VYTORIN) 10-40  MG per tablet Take 1 tablet by mouth at bedtime.  30 tablet  5  . HYDROcodone-acetaminophen (NORCO) 7.5-325 MG per tablet Take 1 tablet by mouth every 4 (four) hours as needed for pain.      Marland Kitchen L-Arginine 500 MG CAPS Take 1 capsule by mouth every 30 (thirty) days.      . Menthol-Methyl Salicylate (MUSCLE RUB) 10-15 % CREA Apply 1 application topically as needed (for upper back pain).      . metroNIDAZOLE (FLAGYL) 500 MG tablet Take 500 mg by mouth 3 (three) times daily. Per pt, takes as needed.      . nitroGLYCERIN (NITROSTAT) 0.4 MG SL tablet Place 1 tablet (0.4 mg total) under the tongue every 5 (five) minutes x 3 doses as needed for chest pain.  25 tablet  12  . OVER THE COUNTER MEDICATION 1 tablet once  a week. Herbal supplement for prostate health.      . prasugrel (EFFIENT) 10 MG TABS Take 1 tablet (10 mg total) by mouth daily.  30 tablet  11  . vitamin A 8000 UNIT capsule Take 8,000 Units by mouth once a week.      . vitamin C (ASCORBIC ACID) 500 MG tablet Take 500 mg by mouth once a week.       No current facility-administered medications for this visit.    History   Social History  . Marital Status: Married    Spouse Name: N/A    Number of Children: N/A  . Years of Education: N/A   Occupational History  . Not on file.   Social History Main Topics  . Smoking status: Former Smoker -- 0.25 packs/day for 2 years    Quit date: 06/07/1963  . Smokeless tobacco: Never Used  . Alcohol Use: No     Comment: occasionally drinks red wine  . Drug Use: No  . Sexually Active: Not on file   Other Topics Concern  . Not on file   Social History Narrative   Not yet started rehabilitation.    ROS: A comprehensive Review of Systems - Negative except pertinent positives noted above otherwise was to get back to his baseline activity.  nno  recurrence of diverticulosis or diverticulitis. He is when necessary antibiotics listed.  PHYSICAL EXAM BP 116/70  Pulse 58  Ht 5\' 9"  (1.753 m)  Wt 172 lb 8 oz (78.245 kg)  BMI 25.46 kg/m2 General appearance: alert, cooperative, appears stated age, no distress and relatively healthy-appearing. Well-nourished and well-groomed. Pleasant mood and affect. Neck: no adenopathy, no carotid bruit, no JVD, supple, symmetrical, trachea midline and thyroid not enlarged, symmetric, no tenderness/mass/nodules Lungs: clear to auscultation bilaterally, normal percussion bilaterally and nonlabored, good air movement. Heart: regular rate and rhythm, S1, S2 normal, no murmur, click, rub or gallop and normal apical impulse Abdomen: soft, non-tender; bowel sounds normal; no masses,  no organomegaly Extremities: extremities normal, atraumatic, no cyanosis or edema, no  edema, redness or tenderness in the calves or thighs and no ulcers, gangrene or trophic changes Pulses: 2+ and symmetric Neurologic: Alert and oriented X 3, normal strength and tone. Normal symmetric reflexes. Normal coordination and gait HEENT: [place]/AT, EOMI, MMM, anicteric sclera  [id number] today: Yes Rate:58 , Rhythm: sinus bradycardia;  Inferior infarct, likely recent with T wave inversions in 3 and aVF. -- This would be consistent with evolutionary changes from his MI.  ASSESSMENT: Relatively stable status post non-STEMI with PCI. Eagerly awaiting getting back to full activity.  NSTEMI (non-ST elevated myocardial infarction) - history of No further anginal symptoms. No heart symptoms. ECG shows evolutionary changes. He is now quite familiar with his anginal symptoms in the future.he is due to start cardiac rehabilitation within the next few weeks.  I advised him strongly the following recommendations: No serious/strenuous exertion until the end of July. (no mowing lawns, weed-eating). Cardiac Rehab will let him inknow when he is able to progress your activity.  CAD S/P percutaneous coronary angioplasty -- mid RCA: Xience expedition 2.75 mm x 18 mm (31 mm Stable. On dual antiplatelet therapy. We'll get his first month of Effient 3 with his car, and then he has the discount card for the rest of the year. After that we'll strongly consider switching over to Plavix for maintenance. He is on statin and Vytorin. He is not on beta blocker or ACE inhibitor due to low blood pressures in the hospital.  With his preserved ejection fraction on that is eager to jump to put him on medications. However his blood pressure were to increase over the next few months for see him back, but I would then consider adding an ACE inhibitor. He is on L-arginine which actually is a pressure patch oxide production which would provide some antianginal effect.  Dyslipidemia- now on Vytorin This is being followed by  his primary physician. His goal now is in LDL less than 70, HDL greater than 40.   PLAN: Per problem list. Orders Placed This Encounter  Procedures  . EKG 12-Lead     Followup: 3 months  Aylani Spurlock W. Herbie Baltimore, M.D., M.S. [facility] [address] [place], [place]  [address]  [phone] Pager # [phone]

## 2012-12-19 ENCOUNTER — Encounter (HOSPITAL_COMMUNITY)
Admission: RE | Admit: 2012-12-19 | Discharge: 2012-12-19 | Disposition: A | Discharge status: Home / Self Care | Payer: Medicare Other | Source: Ambulatory Visit | Attending: Cardiology | Admitting: Cardiology

## 2012-12-19 NOTE — Progress Notes (Signed)
REVIEWED.  

## 2012-12-21 ENCOUNTER — Encounter (HOSPITAL_COMMUNITY)
Admission: RE | Admit: 2012-12-21 | Discharge: 2012-12-21 | Disposition: A | Discharge status: Home / Self Care | Payer: Medicare Other | Source: Ambulatory Visit | Attending: Cardiology | Admitting: Cardiology

## 2012-12-24 ENCOUNTER — Encounter (HOSPITAL_COMMUNITY)
Admission: RE | Admit: 2012-12-24 | Discharge: 2012-12-24 | Disposition: A | Discharge status: Home / Self Care | Payer: Medicare Other | Source: Ambulatory Visit | Attending: Cardiology | Admitting: Cardiology

## 2012-12-26 ENCOUNTER — Encounter (HOSPITAL_COMMUNITY)
Admission: RE | Admit: 2012-12-26 | Discharge: 2012-12-26 | Disposition: A | Discharge status: Home / Self Care | Payer: Medicare Other | Source: Ambulatory Visit | Attending: Cardiology | Admitting: Cardiology

## 2012-12-27 ENCOUNTER — Telehealth: Payer: Self-pay | Admitting: Cardiology

## 2012-12-27 NOTE — Telephone Encounter (Signed)
Returned call and spoke w/ Marily Memos, pt's wife.  Stated since pt had heart attack they fuss a lot and pt is very depressed.  Stated he says he's going to kill himself a lot.  Wife asked if something can be given to him for depression and to calm him down.  Asked wife if pt has stated a plan to harm himself or others.  Stated she doesn't think he has and thinks he may be saying that to her to get back at her.  Stated they were fussing today and pt got in the car and left.  Stated she doesn't know where he is.  Asked wife if she thought pt may harm himself or others as she will need to call 911 for police to locate him.  Wife denied thoughts that pt would harm himself or others.  Advised she contact pt's pcp to schedule appt for evaluation for depression or other mood disorders.  Verbalized understanding and agreed w/ plan.

## 2012-12-27 NOTE — Telephone Encounter (Signed)
Please call-[name] is very depressed-fussing a lot-threaten to kill himself.

## 2012-12-27 NOTE — Telephone Encounter (Signed)
I agree that PCP is best person to handle this.  Marykay Lex, MD

## 2012-12-28 ENCOUNTER — Encounter (HOSPITAL_COMMUNITY)
Admission: RE | Admit: 2012-12-28 | Discharge: 2012-12-28 | Disposition: A | Discharge status: Home / Self Care | Payer: Medicare Other | Source: Ambulatory Visit | Attending: Cardiology | Admitting: Cardiology

## 2012-12-31 ENCOUNTER — Encounter (HOSPITAL_COMMUNITY)
Admission: RE | Admit: 2012-12-31 | Discharge: 2012-12-31 | Disposition: A | Discharge status: Home / Self Care | Payer: Medicare Other | Source: Ambulatory Visit | Attending: Cardiology | Admitting: Cardiology

## 2013-01-02 ENCOUNTER — Encounter (HOSPITAL_COMMUNITY)
Admission: RE | Admit: 2013-01-02 | Discharge: 2013-01-02 | Disposition: A | Discharge status: Home / Self Care | Payer: Medicare Other | Source: Ambulatory Visit | Attending: Cardiology | Admitting: Cardiology

## 2013-01-04 ENCOUNTER — Encounter (HOSPITAL_COMMUNITY)
Admission: RE | Admit: 2013-01-04 | Discharge: 2013-01-04 | Disposition: A | Discharge status: Home / Self Care | Payer: Medicare Other | Source: Ambulatory Visit | Attending: Oncology | Admitting: Oncology

## 2013-01-04 DIAGNOSIS — C9110 Chronic lymphocytic leukemia of B-cell type not having achieved remission: Secondary | ICD-10-CM | POA: Insufficient documentation

## 2013-01-07 ENCOUNTER — Encounter (HOSPITAL_COMMUNITY)
Admission: RE | Admit: 2013-01-07 | Discharge: 2013-01-07 | Disposition: A | Discharge status: Home / Self Care | Payer: Medicare Other | Source: Ambulatory Visit | Attending: Cardiology | Admitting: Cardiology

## 2013-01-09 ENCOUNTER — Encounter (HOSPITAL_COMMUNITY)
Admission: RE | Admit: 2013-01-09 | Discharge: 2013-01-09 | Disposition: A | Discharge status: Home / Self Care | Payer: Medicare Other | Source: Ambulatory Visit | Attending: Cardiology | Admitting: Cardiology

## 2013-01-11 ENCOUNTER — Encounter (HOSPITAL_COMMUNITY)
Admission: RE | Admit: 2013-01-11 | Discharge: 2013-01-11 | Disposition: A | Discharge status: Home / Self Care | Payer: Medicare Other | Source: Ambulatory Visit | Attending: Cardiology | Admitting: Cardiology

## 2013-01-14 ENCOUNTER — Encounter (HOSPITAL_COMMUNITY): Payer: Medicare Other

## 2013-01-16 ENCOUNTER — Encounter (HOSPITAL_COMMUNITY)
Admission: RE | Admit: 2013-01-16 | Discharge: 2013-01-16 | Disposition: A | Discharge status: Home / Self Care | Payer: Medicare Other | Source: Ambulatory Visit | Attending: Cardiology | Admitting: Cardiology

## 2013-01-18 ENCOUNTER — Encounter (HOSPITAL_COMMUNITY)
Admission: RE | Admit: 2013-01-18 | Discharge: 2013-01-18 | Disposition: A | Discharge status: Home / Self Care | Payer: Medicare Other | Source: Ambulatory Visit | Attending: Cardiology | Admitting: Cardiology

## 2013-01-21 ENCOUNTER — Encounter (HOSPITAL_COMMUNITY)
Admission: RE | Admit: 2013-01-21 | Discharge: 2013-01-21 | Disposition: A | Discharge status: Home / Self Care | Payer: Medicare Other | Source: Ambulatory Visit | Attending: Cardiology | Admitting: Cardiology

## 2013-01-23 ENCOUNTER — Encounter (HOSPITAL_COMMUNITY)
Admission: RE | Admit: 2013-01-23 | Discharge: 2013-01-23 | Disposition: A | Discharge status: Home / Self Care | Payer: Medicare Other | Source: Ambulatory Visit | Attending: Cardiology | Admitting: Cardiology

## 2013-01-25 ENCOUNTER — Encounter (HOSPITAL_COMMUNITY)
Admission: RE | Admit: 2013-01-25 | Discharge: 2013-01-25 | Disposition: A | Discharge status: Home / Self Care | Payer: Medicare Other | Source: Ambulatory Visit | Attending: Cardiology | Admitting: Cardiology

## 2013-01-28 ENCOUNTER — Encounter (HOSPITAL_COMMUNITY)
Admission: RE | Admit: 2013-01-28 | Discharge: 2013-01-28 | Disposition: A | Discharge status: Home / Self Care | Payer: Medicare Other | Source: Ambulatory Visit | Attending: Cardiology | Admitting: Cardiology

## 2013-01-30 ENCOUNTER — Encounter (HOSPITAL_COMMUNITY)
Admission: RE | Admit: 2013-01-30 | Discharge: 2013-01-30 | Disposition: A | Discharge status: Home / Self Care | Payer: Medicare Other | Source: Ambulatory Visit | Attending: Cardiology | Admitting: Cardiology

## 2013-02-01 ENCOUNTER — Encounter (HOSPITAL_COMMUNITY)
Admission: RE | Admit: 2013-02-01 | Discharge: 2013-02-01 | Disposition: A | Discharge status: Home / Self Care | Payer: Medicare Other | Source: Ambulatory Visit | Attending: Cardiology | Admitting: Cardiology

## 2013-02-04 ENCOUNTER — Encounter (HOSPITAL_COMMUNITY): Payer: Medicare Other

## 2013-02-06 ENCOUNTER — Encounter (HOSPITAL_COMMUNITY)
Admission: RE | Admit: 2013-02-06 | Discharge: 2013-02-06 | Disposition: A | Discharge status: Home / Self Care | Payer: Medicare Other | Source: Ambulatory Visit | Attending: Oncology | Admitting: Oncology

## 2013-02-06 DIAGNOSIS — C9110 Chronic lymphocytic leukemia of B-cell type not having achieved remission: Secondary | ICD-10-CM | POA: Insufficient documentation

## 2013-02-08 ENCOUNTER — Encounter (HOSPITAL_COMMUNITY)
Admission: RE | Admit: 2013-02-08 | Discharge: 2013-02-08 | Disposition: A | Discharge status: Home / Self Care | Payer: Medicare Other | Source: Ambulatory Visit | Attending: Cardiology | Admitting: Cardiology

## 2013-02-11 ENCOUNTER — Encounter (HOSPITAL_COMMUNITY)
Admission: RE | Admit: 2013-02-11 | Discharge: 2013-02-11 | Disposition: A | Discharge status: Home / Self Care | Payer: Medicare Other | Source: Ambulatory Visit | Attending: Cardiology | Admitting: Cardiology

## 2013-02-13 ENCOUNTER — Encounter (HOSPITAL_COMMUNITY)
Admission: RE | Admit: 2013-02-13 | Discharge: 2013-02-13 | Disposition: A | Discharge status: Home / Self Care | Payer: Medicare Other | Source: Ambulatory Visit | Attending: Cardiology | Admitting: Cardiology

## 2013-02-15 ENCOUNTER — Encounter (HOSPITAL_COMMUNITY)
Admission: RE | Admit: 2013-02-15 | Discharge: 2013-02-15 | Disposition: A | Discharge status: Home / Self Care | Payer: Medicare Other | Source: Ambulatory Visit | Attending: Cardiology | Admitting: Cardiology

## 2013-02-18 ENCOUNTER — Encounter (HOSPITAL_COMMUNITY): Payer: Medicare Other

## 2013-02-20 ENCOUNTER — Encounter (HOSPITAL_COMMUNITY): Payer: Medicare Other

## 2013-02-22 ENCOUNTER — Encounter (HOSPITAL_COMMUNITY): Payer: Medicare Other

## 2013-02-25 ENCOUNTER — Encounter (HOSPITAL_COMMUNITY)
Admission: RE | Admit: 2013-02-25 | Discharge: 2013-02-25 | Disposition: A | Discharge status: Home / Self Care | Payer: Medicare Other | Source: Ambulatory Visit | Attending: Cardiology | Admitting: Cardiology

## 2013-02-27 ENCOUNTER — Encounter (HOSPITAL_COMMUNITY)
Admission: RE | Admit: 2013-02-27 | Discharge: 2013-02-27 | Disposition: A | Discharge status: Home / Self Care | Payer: Medicare Other | Source: Ambulatory Visit | Attending: Cardiology | Admitting: Cardiology

## 2013-03-01 ENCOUNTER — Encounter (HOSPITAL_COMMUNITY)
Admission: RE | Admit: 2013-03-01 | Discharge: 2013-03-01 | Disposition: A | Discharge status: Home / Self Care | Payer: Medicare Other | Source: Ambulatory Visit | Attending: Cardiology | Admitting: Cardiology

## 2013-03-04 ENCOUNTER — Encounter (HOSPITAL_COMMUNITY)
Admission: RE | Admit: 2013-03-04 | Discharge: 2013-03-04 | Disposition: A | Discharge status: Home / Self Care | Payer: Medicare Other | Source: Ambulatory Visit | Attending: Cardiology | Admitting: Cardiology

## 2013-03-06 ENCOUNTER — Encounter (HOSPITAL_COMMUNITY)
Admission: RE | Admit: 2013-03-06 | Discharge: 2013-03-06 | Disposition: A | Discharge status: Home / Self Care | Payer: Medicare Other | Source: Ambulatory Visit | Attending: Oncology | Admitting: Oncology

## 2013-03-06 DIAGNOSIS — C9110 Chronic lymphocytic leukemia of B-cell type not having achieved remission: Secondary | ICD-10-CM | POA: Insufficient documentation

## 2013-03-08 ENCOUNTER — Encounter (HOSPITAL_COMMUNITY)
Admission: RE | Admit: 2013-03-08 | Discharge: 2013-03-08 | Disposition: A | Discharge status: Home / Self Care | Payer: Medicare Other | Source: Ambulatory Visit | Attending: Cardiology | Admitting: Cardiology

## 2013-03-11 ENCOUNTER — Encounter: Payer: Self-pay | Admitting: Cardiology

## 2013-03-11 ENCOUNTER — Encounter (HOSPITAL_COMMUNITY)
Admission: RE | Admit: 2013-03-11 | Discharge: 2013-03-11 | Disposition: A | Discharge status: Home / Self Care | Payer: Medicare Other | Source: Ambulatory Visit | Attending: Cardiology | Admitting: Cardiology

## 2013-03-11 DIAGNOSIS — F4310 Post-traumatic stress disorder, unspecified: Secondary | ICD-10-CM | POA: Insufficient documentation

## 2013-03-12 ENCOUNTER — Ambulatory Visit (INDEPENDENT_AMBULATORY_CARE_PROVIDER_SITE_OTHER): Payer: Medicare Other | Admitting: Cardiology

## 2013-03-12 VITALS — BP 138/80 | HR 54 | Ht 69.0 in | Wt 177.7 lb

## 2013-03-12 DIAGNOSIS — R609 Edema, unspecified: Secondary | ICD-10-CM

## 2013-03-12 DIAGNOSIS — Z9861 Coronary angioplasty status: Secondary | ICD-10-CM

## 2013-03-12 DIAGNOSIS — R600 Localized edema: Secondary | ICD-10-CM

## 2013-03-12 DIAGNOSIS — I2510 Atherosclerotic heart disease of native coronary artery without angina pectoris: Secondary | ICD-10-CM

## 2013-03-12 DIAGNOSIS — E785 Hyperlipidemia, unspecified: Secondary | ICD-10-CM

## 2013-03-12 DIAGNOSIS — I214 Non-ST elevation (NSTEMI) myocardial infarction: Secondary | ICD-10-CM

## 2013-03-12 DIAGNOSIS — F4310 Post-traumatic stress disorder, unspecified: Secondary | ICD-10-CM

## 2013-03-12 MED ORDER — HYDROCHLOROTHIAZIDE 25 MG PO TABS: 25.0000 mg | ORAL_TABLET | Freq: Every day | ORAL | Status: DC

## 2013-03-12 NOTE — Progress Notes (Signed)
PATIENT: [name] MRN: [id number]  DOB: 11-Oct-1945   DOV:03/14/2013 PCP: [name], MD  Clinic Note: Chief Complaint  Patient presents with  . Follow-up    3 months rov, 2 weeks ago was pushing on a dooor and started hurting in chest   HPI: [name] is a 66 y.o. male with a PMH below who presents today for a 3-4 months followup post-non-STEMI PCI. He had a non-STEMI on 11/23/2012 and had catheterization done the next day with PCI to the RCA.  He also is history of CLL along with hyperlipidemia.  He was last seen on July 7 time is doing quite well that time.  He is contemplating whether it is the start cardiac rehabilitation, he essentially started as close to completing his course.  He is enjoying it thoroughly.   One thing of concern, was that his wife called in about a month ago noting significant depression with suicidal ideation secondary to being upset about his recent MI and PCI along with CLL.  I did refer him to his primary care provider who did prescribe something which he is no longer taking.  He says it is much improved, and the cardiac rehabilitation has really helped.    Interval History:  otherwise, he seems to do very well.  He's done exceptionally well at rehabilitation, meeting all the milestones.  He is actually trying to figure out whether he would be extubated and continued on with the maintenance course or try to get away to get a membership at a gym.  His has been in better spirits.  He has actively gotten back involved in doing activities at home.  The remainder of Cardiovascular ROS: no chest pain or dyspnea on exertion positive for - edema and Mild varicosities.  This is not bothering him, the swelling goes down at night. negative for - edema, irregular heartbeat, loss of consciousness, murmur, orthopnea, palpitations, paroxysmal nocturnal dyspnea, rapid heart rate or shortness of breath: Additional cardiac review of systems: Lightheadedness - no,  dizziness - no, syncope/near-syncope - no; TIA/amaurosis fugax - no Melena - no, hematochezia no; hematuria - no; nosebleeds - no; claudication - no  Past Medical History  Diagnosis Date  . Diverticulitis   . Hypercholesteremia   . 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 BPH (benign prostatic hyperplasia) 04/08/2011  . Gallstone   . Depression   . Headache(784.0)   . GERD (gastroesophageal reflux disease)   . H/O Non-ST elevation MI (NSTEMI) 11/23/12    RCA lesion - PCI with DES; moderate LAD disease  . CAD S/P percutaneous coronary angioplasty 11/23/12    2 overlapping mRCA lesions - Xience Xpedition DES 2.75 mm x 18 mm (3.0 mm)  . PTSD (post-traumatic stress disorder)     With Depression - since MI   Prior Cardiac Evaluation and Past Surgical History: Past Surgical History  Procedure Laterality Date  . Appendectomy  [id number]  . Colonoscopy  2008    WUJ:WJXB-JYNWG diverticula, diminutive polyp in the cecum, s/p bx Normal rectum. Tubular adenoma  . Colonoscopy N/A 10/17/2012    Procedure: COLONOSCOPY;  Surgeon: Corbin Ade, MD;  Location: [facility];  Service: Endoscopy;  Laterality: N/A;  2:00  . Cardiac catheterization  11/24/12    NSTEMI: Culprit = mid RCA 95% & 75%; LAD ~40-50%, Small RI ~60%; EF ~60%, mild inf-basal HK  . Coronary stent placement  11/24/12    mid RCA -- Xience eXp - 2.75 mm x 18  mm DES (3.10mm) , Dr. Herbie Baltimore   No Known Allergies  Current Outpatient Prescriptions  Medication Sig Dispense Refill  . ARTIFICIAL TEAR SOLUTION OP Apply 1 drop to eye as needed (dry eyes).      Marland Kitchen aspirin EC 81 MG tablet Take 81 mg by mouth daily.       . beta carotene [address] UNIT capsule Take 25,000 Units by mouth once a week.      . Cholecalciferol (D3-1000) 1000 UNITS capsule Take 1,000 Units by mouth 2 (two) times a week.        . Cyanocobalamin (VITAMIN B-12) 2500 MCG SUBL Place 1 tablet under the tongue daily.      Marland Kitchen ezetimibe-simvastatin (VYTORIN) 10-40 MG per tablet  Take 1 tablet by mouth at bedtime.  30 tablet  5  . HYDROcodone-acetaminophen (NORCO) 7.5-325 MG per tablet Take 1 tablet by mouth every 4 (four) hours as needed for pain.      Marland Kitchen L-Arginine 500 MG CAPS Take 1 capsule by mouth every 30 (thirty) days.      . Menthol-Methyl Salicylate (MUSCLE RUB) 10-15 % CREA Apply 1 application topically as needed (for upper back pain).      . metroNIDAZOLE (FLAGYL) 500 MG tablet Take 500 mg by mouth 3 (three) times daily. Per pt, takes as needed.      . nitroGLYCERIN (NITROSTAT) 0.4 MG SL tablet Place 1 tablet (0.4 mg total) under the tongue every 5 (five) minutes x 3 doses as needed for chest pain.  25 tablet  12  . OVER THE COUNTER MEDICATION 1 tablet once a week. Herbal supplement for prostate health.      . prasugrel (EFFIENT) 10 MG TABS Take 1 tablet (10 mg total) by mouth daily.  30 tablet  11  . vitamin A 8000 UNIT capsule Take 8,000 Units by mouth once a week.      . vitamin C (ASCORBIC ACID) 500 MG tablet Take 500 mg by mouth once a week.      . hydrochlorothiazide (HYDRODIURIL) 25 MG tablet Take 1 tablet (25 mg total) by mouth daily.  30 tablet  11   No current facility-administered medications for this visit.    History   Social History Narrative   Currently in Cardiac Rehabilitation   "former smoker". Does not drink EtOH   Married.   ROS: A comprehensive Review of Systems - Negative except The psychological issues noted above.  PHYSICAL EXAM BP 138/80  Pulse 54  Ht 5\' 9"  (1.753 m)  Wt 177 lb 11.2 oz (80.604 kg)  BMI 26.23 kg/m2 General appearance: alert, cooperative, appears stated age, no distress and relatively healthy-appearing. Well-nourished and well-groomed. Pleasant mood and affect.  HEENT: [place]/AT, EOMI, MMM, anicteric sclera Neck: no adenopathy, no carotid bruit, no JVD, supple, symmetrical, trachea midline and thyroid not enlarged, symmetric, no tenderness/mass/nodules  Lungs: clear to auscultation bilaterally, normal percussion  bilaterally and nonlabored, good air movement.  Heart: regular rate and rhythm, S1, S2 normal, no murmur, click, rub or gallop and normal apical impulse  Abdomen: soft, non-tender; bowel sounds normal; no masses, no organomegaly  Extremities: extremities normal, atraumatic, no cyanosis or edema, no edema, redness or tenderness in the calves or thighs and no ulcers, gangrene or trophic changes ;Pulses: 2+ and symmetric  Neurologic: Alert and oriented X 3, normal strength and tone. Normal symmetric reflexes. Normal coordination and gait   UEA:[id number] today: Yes Rate:54  , Rhythm:  sinus bradycardia; Otherwise normal ECG  Recent Labs:  no labs since last visit  ASSESSMENT / PLAN: CAD S/P percutaneous coronary angioplasty -- mid RCA: Xience expedition 2.75 mm x 18 mm (3.1 mm) Stable on DAPT.  And statin.  No ongoing symptoms of angina or heart failure. Completing Cardiac Rehabilitation Course/ Phase 2 Is currently on Effient, this could be switched to Plavix if need be for financial purposes. On Vytorin, and L-arginine.  Previously not on ACE inhibitordue to low blood pressures, however his blood pressure is coming up some.  This can be revisited in followup visits. No beta blocker due to bradycardia  NSTEMI (non-ST elevated myocardial infarction) - history of No adverse events.  No arrhythmias.  No heart failure.  He had preserved EF at the time of MI.  I am very happy he is considering either the maintenance program with cardiac rehabilitation or some other type of independent program.  I talked about the possibility of Silver Sneakers as well as other potential Gyms that may have monitored exercise programs.  Dyslipidemia- now on Vytorin His labs are essentially pretty much [place] during last visit with the exception of his HDL being low.  He is on L-arginine which may be of some benefit.  The best thing to do for him however will be exercise.  PTSD (post-traumatic stress disorder) He  certainly had some significant symptoms post MI.  Thankfully be seen to have been improved.  I recommended that if his PCP wanted to be on some type of antidepressant agent that he should strongly consider being it.  I also gave him permission to be upset and fearful after his MI.  Common uterus is once mortality and morbidity is a fearful experience.  Excepting that and moving all and is probably the best thing that cardiac rehabilitation health with.  Lower leg edema This is a be some annoying to him, we talked about using support those wearing compression stockings.  But since his blood pressure is coming up a little bit, I will start him on HCTZ for combined diuretic and blood pressure effect.   Orders Placed This Encounter  Procedures  . EKG 12-Lead   Meds ordered this encounter  Medications  . hydrochlorothiazide (HYDRODIURIL) 25 MG tablet    Sig: Take 1 tablet (25 mg total) by mouth daily.    Dispense:  30 tablet    Refill:  11   Followup:  6 months  DAVID W. Herbie Baltimore, M.D., M.S. [facility] [address] [place], [place]  [address]  [phone] Pager # [phone]

## 2013-03-12 NOTE — Patient Instructions (Addendum)
Start HCTZ (hydrochlorathazide) 25 mg once a day  Continue your current medication  Your physician wants you to follow-up in 6 month Dr Herbie Baltimore.  You will receive a reminder letter in the mail two months in advance. If you don't receive a letter, please call our office to schedule the follow-up appointment.

## 2013-03-13 ENCOUNTER — Encounter (HOSPITAL_COMMUNITY)
Admission: RE | Admit: 2013-03-13 | Discharge: 2013-03-13 | Disposition: A | Discharge status: Home / Self Care | Payer: Medicare Other | Source: Ambulatory Visit | Attending: Cardiology | Admitting: Cardiology

## 2013-03-14 ENCOUNTER — Encounter: Payer: Self-pay | Admitting: Cardiology

## 2013-03-14 DIAGNOSIS — R600 Localized edema: Secondary | ICD-10-CM | POA: Insufficient documentation

## 2013-03-14 NOTE — Assessment & Plan Note (Signed)
His labs are essentially pretty much [place] during last visit with the exception of his HDL being low.  He is on L-arginine which may be of some benefit.  The best thing to do for him however will be exercise.

## 2013-03-14 NOTE — Assessment & Plan Note (Signed)
Stable on DAPT.  And statin.  No ongoing symptoms of angina or heart failure. Completing Cardiac Rehabilitation Course/ Phase 2 Is currently on Effient, this could be switched to Plavix if need be for financial purposes. On Vytorin, and L-arginine.  Previously not on ACE inhibitordue to low blood pressures, however his blood pressure is coming up some.  This can be revisited in followup visits. No beta blocker due to bradycardia

## 2013-03-14 NOTE — Assessment & Plan Note (Signed)
He certainly had some significant symptoms post MI.  Thankfully be seen to have been improved.  I recommended that if his PCP wanted to be on some type of antidepressant agent that he should strongly consider being it.  I also gave him permission to be upset and fearful after his MI.  Common uterus is once mortality and morbidity is a fearful experience.  Excepting that and moving all and is probably the best thing that cardiac rehabilitation health with.

## 2013-03-14 NOTE — Assessment & Plan Note (Signed)
This is a be some annoying to him, we talked about using support those wearing compression stockings.  But since his blood pressure is coming up a little bit, I will start him on HCTZ for combined diuretic and blood pressure effect.

## 2013-03-14 NOTE — Assessment & Plan Note (Signed)
No adverse events.  No arrhythmias.  No heart failure.  He had preserved EF at the time of MI.  I am very happy he is considering either the maintenance program with cardiac rehabilitation or some other type of independent program.  I talked about the possibility of Silver Sneakers as well as other potential Gyms that may have monitored exercise programs.

## 2013-03-15 ENCOUNTER — Encounter (HOSPITAL_COMMUNITY)
Admission: RE | Admit: 2013-03-15 | Discharge: 2013-03-15 | Disposition: A | Discharge status: Home / Self Care | Payer: Medicare Other | Source: Ambulatory Visit | Attending: Cardiology | Admitting: Cardiology

## 2013-03-18 ENCOUNTER — Encounter (HOSPITAL_COMMUNITY)
Admission: RE | Admit: 2013-03-18 | Discharge: 2013-03-18 | Disposition: A | Discharge status: Home / Self Care | Payer: Medicare Other | Source: Ambulatory Visit | Attending: Cardiology | Admitting: Cardiology

## 2013-03-20 ENCOUNTER — Encounter (HOSPITAL_COMMUNITY)
Admission: RE | Admit: 2013-03-20 | Discharge: 2013-03-20 | Disposition: A | Discharge status: Home / Self Care | Payer: Medicare Other | Source: Ambulatory Visit | Attending: Cardiology | Admitting: Cardiology

## 2013-04-15 ENCOUNTER — Encounter (HOSPITAL_COMMUNITY): Payer: Medicare Other | Attending: Oncology

## 2013-04-15 DIAGNOSIS — C9110 Chronic lymphocytic leukemia of B-cell type not having achieved remission: Secondary | ICD-10-CM | POA: Insufficient documentation

## 2013-04-15 LAB — CBC WITH DIFFERENTIAL/PLATELET
Basophils Absolute: 0.1 10*3/uL (ref 0.0–0.1)
Basophils Relative: 0 % (ref 0–1)
Eosinophils Absolute: 0.3 10*3/uL (ref 0.0–0.7)
Eosinophils Relative: 1 % (ref 0–5)
HCT: 46.8 % (ref 39.0–52.0)
Hemoglobin: 14.7 g/dL (ref 13.0–17.0)
Lymphocytes Relative: 91 % — ABNORMAL HIGH (ref 12–46)
Lymphs Abs: 40 10*3/uL — ABNORMAL HIGH (ref 0.7–4.0)
MCH: 29.9 pg (ref 26.0–34.0)
MCHC: 31.4 g/dL (ref 30.0–36.0)
MCV: 95.1 fL (ref 78.0–100.0)
Monocytes Absolute: 0.2 10*3/uL (ref 0.1–1.0)
Monocytes Relative: 1 % — ABNORMAL LOW (ref 3–12)
Neutro Abs: 3.4 10*3/uL (ref 1.7–7.7)
Neutrophils Relative %: 8 % — ABNORMAL LOW (ref 43–77)
Platelets: 237 10*3/uL (ref 150–400)
RBC: 4.92 MIL/uL (ref 4.22–5.81)
RDW: 14.3 % (ref 11.5–15.5)
WBC: 44 10*3/uL — ABNORMAL HIGH (ref 4.0–10.5)

## 2013-04-15 LAB — COMPREHENSIVE METABOLIC PANEL
ALT: 11 U/L (ref 0–53)
AST: 25 U/L (ref 0–37)
Albumin: 3.5 g/dL (ref 3.5–5.2)
Alkaline Phosphatase: 59 U/L (ref 39–117)
BUN: 13 mg/dL (ref 6–23)
CO2: 26 mEq/L (ref 19–32)
Calcium: 9.3 mg/dL (ref 8.4–10.5)
Chloride: 106 mEq/L (ref 96–112)
Creatinine, Ser: 1.22 mg/dL (ref 0.50–1.35)
GFR calc Af Amer: 68 mL/min — ABNORMAL LOW (ref >90)
GFR calc non Af Amer: 59 mL/min — ABNORMAL LOW (ref >90)
Glucose, Bld: 152 mg/dL — ABNORMAL HIGH (ref 70–99)
Potassium: 3.8 mEq/L (ref 3.5–5.1)
Sodium: 140 mEq/L (ref 135–145)
Total Bilirubin: 0.4 mg/dL (ref 0.3–1.2)
Total Protein: 6.5 g/dL (ref 6.0–8.3)

## 2013-04-15 LAB — LACTATE DEHYDROGENASE: LDH: 167 U/L (ref 94–250)

## 2013-04-15 NOTE — Progress Notes (Signed)
Labs drawn today for cbc/diff,cmp,ldh 

## 2013-04-17 ENCOUNTER — Ambulatory Visit (HOSPITAL_COMMUNITY): Payer: Medicare Other | Admitting: Oncology

## 2013-04-22 ENCOUNTER — Encounter (HOSPITAL_COMMUNITY): Payer: Self-pay

## 2013-04-22 ENCOUNTER — Encounter (HOSPITAL_BASED_OUTPATIENT_CLINIC_OR_DEPARTMENT_OTHER): Payer: Medicare Other

## 2013-04-22 VITALS — BP 139/78 | HR 75 | Temp 97.3°F | Resp 16 | Wt 172.0 lb

## 2013-04-22 DIAGNOSIS — C9110 Chronic lymphocytic leukemia of B-cell type not having achieved remission: Secondary | ICD-10-CM

## 2013-04-22 DIAGNOSIS — R599 Enlarged lymph nodes, unspecified: Secondary | ICD-10-CM

## 2013-04-22 NOTE — Patient Instructions (Signed)
[facility] Discharge Instructions  RECOMMENDATIONS MADE BY THE CONSULTANT AND ANY TEST RESULTS WILL BE SENT TO YOUR REFERRING PHYSICIAN.  Lab work again in March 2015. CT scan March 2015 after lab work. MD appointment March 2015 after CT scan.   Thank you for choosing [facility] to provide your oncology and hematology care.  To afford each patient quality time with our providers, please arrive at least 15 minutes before your scheduled appointment time.  With your help, our goal is to use those 15 minutes to complete the necessary work-up to ensure our physicians have the information they need to help with your evaluation and healthcare recommendations.    Effective January 1st, 2014, we ask that you re-schedule your appointment with our physicians should you arrive 10 or more minutes late for your appointment.  We strive to give you quality time with our providers, and arriving late affects you and other patients whose appointments are after yours.    Again, thank you for choosing [facility].  Our hope is that these requests will decrease the amount of time that you wait before being seen by our physicians.       _____________________________________________________________  Should you have questions after your visit to [facility], please contact our office at [phone] between the hours of 8:30 a.m. and 5:00 p.m.  Voicemails left after 4:30 p.m. will not be returned until the following business day.  For prescription refill requests, have your pharmacy contact our office with your prescription refill request.

## 2013-04-22 NOTE — Progress Notes (Signed)
[facility]  OFFICE PROGRESS NOTE  [name] Reichert, MD [address] [place] [address]  DIAGNOSIS: CLL (chronic lymphocytic leukemia) - Plan: CBC with Differential, Comprehensive metabolic panel, Lactate dehydrogenase, Flow Cytometry, CT Abdomen W Contrast, CT Chest W Contrast  Chief Complaint: CLL-f/u visit  CURRENT THERAPY: Observation    INTERVAL HISTORY: [name] 67 y.o. male returns for follow on stage-I CLL. Since the time of last visit he states that he's been doing diet and exercise program and is feeling better. He says that he had MI and stents were placed in June 2014. His last CT of abdomen and pelvis was in March 2014 that revealed multipl mildly enlarged retroperitoneal, mesenteric and pelvic lymph nodes are again noted.    He denies any headaches, dizziness, double vision, fevers, chills, night sweats, nausea, vomiting, diarrhea, constipation, chest pain, heart palpitations, shortness of breath, blood in stool, black tarry stool, urinary pain, urinary burning, urinary frequency, hematuria.  His CBCperformed on 04/15/2013 revealed WBC of 44 and absolute lymphocyte count of 40. previous counts were performed in May,20 14 revealed lymphocytosis of 49.6 WBC count of 54.4    MEDICAL HISTORY: Past Medical History  Diagnosis Date  . Diverticulitis   . Hypercholesteremia   . 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 BPH (benign prostatic hyperplasia) 04/08/2011  . Gallstone   . Depression   . Headache(784.0)   . GERD (gastroesophageal reflux disease)   . H/O Non-ST elevation MI (NSTEMI) 11/23/12    RCA lesion - PCI with DES; moderate LAD disease  . CAD S/P percutaneous coronary angioplasty 11/23/12    2 overlapping mRCA lesions - Xience Xpedition DES 2.75 mm x 18 mm (3.0 mm)  . PTSD (post-traumatic stress disorder)     With Depression - since MI    INTERIM HISTORY: has CLL (chronic lymphocytic leukemia); BPH  (benign prostatic hyperplasia); Diverticulitis of colon without hemorrhage- on antibiotics for recent flair up; Hx of adenomatous colonic polyps; NSTEMI (non-ST elevated myocardial infarction) - history of; Hyperglycemia; History of smoking; Dyslipidemia- now on Vytorin; CAD S/P percutaneous coronary angioplasty -- mid RCA: Xience expedition 2.75 mm x 18 mm (3.1 mm); PTSD (post-traumatic stress disorder); and Lower leg edema on his problem list.    ALLERGIES:  has No Known Allergies.  MEDICATIONS:  Current Outpatient Prescriptions  Medication Sig Dispense Refill  . ARTIFICIAL TEAR SOLUTION OP Apply 1 drop to eye as needed (dry eyes).      Marland Kitchen aspirin EC 81 MG tablet Take 81 mg by mouth daily.       . Cholecalciferol (D3-1000) 1000 UNITS capsule Take 1,000 Units by mouth 2 (two) times a week.        . ciprofloxacin (CIPRO) 500 MG tablet Take 500 mg by mouth 2 (two) times daily.      . Cyanocobalamin (VITAMIN B-12) 2500 MCG SUBL Place 1 tablet under the tongue daily.      Marland Kitchen ezetimibe-simvastatin (VYTORIN) 10-40 MG per tablet Take 1 tablet by mouth at bedtime.  30 tablet  5  . folic acid (FOLVITE) 400 MCG tablet Take 400 mcg by mouth daily.      Marland Kitchen HYDROcodone-acetaminophen (NORCO) 7.5-325 MG per tablet Take 1 tablet by mouth every 4 (four) hours as needed for pain.      Marland Kitchen L-Arginine 500 MG CAPS Take 1 capsule by mouth every 30 (thirty) days.      . Menthol-Methyl Salicylate (MUSCLE RUB) 10-15 %  CREA Apply 1 application topically as needed (for upper back pain).      . metroNIDAZOLE (FLAGYL) 500 MG tablet Take 500 mg by mouth 3 (three) times daily. Per pt, takes as needed.      Marland Kitchen OVER THE COUNTER MEDICATION 1 tablet once a week. Herbal supplement for prostate health.      . prasugrel (EFFIENT) 10 MG TABS Take 1 tablet (10 mg total) by mouth daily.  30 tablet  11  . beta carotene [address] UNIT capsule Take 25,000 Units by mouth once a week.      . hydrochlorothiazide (HYDRODIURIL) 25 MG tablet Take 1  tablet (25 mg total) by mouth daily.  30 tablet  11  . nitroGLYCERIN (NITROSTAT) 0.4 MG SL tablet Place 1 tablet (0.4 mg total) under the tongue every 5 (five) minutes x 3 doses as needed for chest pain.  25 tablet  12  . vitamin A 8000 UNIT capsule Take 8,000 Units by mouth once a week.      . vitamin C (ASCORBIC ACID) 500 MG tablet Take 500 mg by mouth once a week.       No current facility-administered medications for this visit.    SURGICAL HISTORY:  Past Surgical History  Procedure Laterality Date  . Appendectomy  [id number]  . Colonoscopy  2008    UEA:[id number] diverticula, diminutive polyp in the cecum, s/p bx Normal rectum. Tubular adenoma  . Colonoscopy N/A 10/17/2012    Procedure: COLONOSCOPY;  Surgeon: Corbin Ade, MD;  Location: [facility];  Service: Endoscopy;  Laterality: N/A;  2:00  . Cardiac catheterization  11/24/12    NSTEMI: Culprit = mid RCA 95% & 75%; LAD ~40-50%, Small RI ~60%; EF ~60%, mild inf-basal HK  . Coronary stent placement  11/24/12    mid RCA -- Xience eXp - 2.75 mm x 18 mm DES (3.22mm) , Dr. Herbie Baltimore    FAMILY HISTORY: family history includes Cancer in his father; Diabetes in his mother. There is no history of Colon cancer.  SOCIAL HISTORY:  reports that he quit smoking about 49 years ago. He has never used smokeless tobacco. He reports that he does not drink alcohol or use illicit drugs.  REVIEW OF SYSTEMS:  As mentioned in interval history PHYSICAL EXAMINATION: ECOG PERFORMANCE STATUS: 0 - Asymptomatic  Blood pressure 139/78, pulse 75, temperature 97.3 F (36.3 C), temperature source Oral, resp. rate 16, weight 172 lb (78.019 kg).  GENERAL:alert, no distress, well nourished and well developed SKIN: no rashes or significant lesions HEAD: Normocephalic EYES: PERRLA, EOMI, Conjunctiva are pink and non-injected, sclera clear EARS: External ears normal OROPHARYNX:no erythema, lips, buccal mucosa, and tongue normal and mucous membranes are moist    NECK: supple, no adenopathy, no JVD, no stridor, non-tender LYMPH:  Left axillry , soft , movable 1cm palpable lymphadenopathy- noted, no hepatosplenomegaly BREAST:breasts appear normal, no suspicious masses, no skin or nipple changes or axillary nodes LUNGS: clear to auscultation , coarse sounds heard HEART: regular rate & rhythm ABDOMEN:abdomen soft, obese and normal bowel sounds BACK: Back symmetric, no curvature. EXTREMITIES:no edema, no clubbing and no cyanosis  NEURO: alert & oriented x 3 with fluent speech, no focal motor/sensory deficits, gait normal   LABORATORY DATA: Infusion on 04/15/2013  Component Date Value Range Status  . WBC 04/15/2013 44.0* 4.0 - 10.5 K/uL Final  . RBC 04/15/2013 4.92  4.22 - 5.81 MIL/uL Final  . Hemoglobin 04/15/2013 14.7  13.0 - 17.0 g/dL Final  . HCT  04/15/2013 46.8  39.0 - 52.0 % Final  . MCV 04/15/2013 95.1  78.0 - 100.0 fL Final  . MCH 04/15/2013 29.9  26.0 - 34.0 pg Final  . MCHC 04/15/2013 31.4  30.0 - 36.0 g/dL Final  . RDW 84/69/6295 14.3  11.5 - 15.5 % Final  . Platelets 04/15/2013 237  150 - 400 K/uL Final  . Neutrophils Relative % 04/15/2013 8* 43 - 77 % Final  . Neutro Abs 04/15/2013 3.4  1.7 - 7.7 K/uL Final  . Lymphocytes Relative 04/15/2013 91* 12 - 46 % Final  . Lymphs Abs 04/15/2013 40.0* 0.7 - 4.0 K/uL Final  . Monocytes Relative 04/15/2013 1* 3 - 12 % Final  . Monocytes Absolute 04/15/2013 0.2  0.1 - 1.0 K/uL Final  . Eosinophils Relative 04/15/2013 1  0 - 5 % Final  . Eosinophils Absolute 04/15/2013 0.3  0.0 - 0.7 K/uL Final  . Basophils Relative 04/15/2013 0  0 - 1 % Final  . Basophils Absolute 04/15/2013 0.1  0.0 - 0.1 K/uL Final  . WBC Morphology 04/15/2013 ABSOLUTE LYMPHOCYTOSIS   Final   Comment: ATYPICAL LYMPHOCYTES                          SMUDGE CELLS  . Sodium 04/15/2013 140  135 - 145 mEq/L Final  . Potassium 04/15/2013 3.8  3.5 - 5.1 mEq/L Final  . Chloride 04/15/2013 106  96 - 112 mEq/L Final  . CO2  04/15/2013 26  19 - 32 mEq/L Final  . Glucose, Bld 04/15/2013 152* 70 - 99 mg/dL Final  . BUN 28/41/3244 13  6 - 23 mg/dL Final  . Creatinine, Ser 04/15/2013 1.22  0.50 - 1.35 mg/dL Final  . Calcium 06/08/7251 9.3  8.4 - 10.5 mg/dL Final  . Total Protein 04/15/2013 6.5  6.0 - 8.3 g/dL Final  . Albumin 66/44/0347 3.5  3.5 - 5.2 g/dL Final  . AST 42/59/5638 25  0 - 37 U/L Final  . ALT 04/15/2013 11  0 - 53 U/L Final  . Alkaline Phosphatase 04/15/2013 59  39 - 117 U/L Final  . Total Bilirubin 04/15/2013 0.4  0.3 - 1.2 mg/dL Final  . GFR calc non Af Amer 04/15/2013 59* >90 mL/min Final  . GFR calc Af Amer 04/15/2013 68* >90 mL/min Final   Comment: (NOTE)                          The eGFR has been calculated using the CKD EPI equation.                          This calculation has not been validated in all clinical situations.                          eGFR's persistently <90 mL/min signify possible Chronic Kidney                          Disease.  Marland Kitchen LDH 04/15/2013 167  94 - 250 U/L Final    Urinalysis No results found for this basename: colorurine, appearanceur, labspec, phurine, glucoseu, hgbur, bilirubinur, ketonesur, proteinur, urobilinogen, nitrite, leukocytesur    RADIOGRAPHIC STUDIES: No results found.   ASSESSMENT:  1. CLL, stage I, ZAP-negative, continues to be asymptomatic:his counts are stable at this time. We will obtain  the CBC and differential, CMP, LDH, and beta-2 microglobulin and flow cytometry prior to next visit. We'll also arrange for a CT of the neck chest abdomen and pelvis in view of his lymphadenopathy prior to next visit  2.H/O Multiple mildly enlarged retroperitoneal, mesenteric and pelvic  lymph nodes and newly diagnosed left axillary lymphadenopathy most likely secondary to CLL. Will arrange CT of the neck chest abdomen and pelvis prior to next visit  3. BPH   4. H/o CAD,NSTEMI, S/P stent placement    PLAN:  1. CBC diff, CMET, LDH ,beta-2 macroglobulin  and flow cytometry-peripheral blood prior to next visit 2.CT of the neck chest abdomen and pelvis  3. Return visit in March 2014   All questions were answered. The patient knows to call the clinic with any problems, questions or concerns. We can certainly see the patient much sooner if necessary.   I spent 20 minutes counseling the patient face to face. The total time spent in the appointment was 35 minutes   Annamarie Dawley, MD 04/22/2013 7:27 PM

## 2013-05-23 NOTE — Progress Notes (Signed)
Great work.  Can probably increase HR goal to 110s.  Marykay Lex, MD

## 2013-05-23 NOTE — Addendum Note (Signed)
Encounter addended by: Angelica Pou, RN on: 05/23/2013 10:25 AM<BR>     Documentation filed: Clinical Notes

## 2013-05-23 NOTE — Addendum Note (Signed)
Encounter addended by: Angelica Pou, RN on: 05/23/2013 10:11 AM<BR>     Documentation filed: Clinical Notes

## 2013-05-23 NOTE — Addendum Note (Signed)
Encounter addended by: Angelica Pou, RN on: 05/23/2013 10:10 AM<BR>     Documentation filed: Notes Section

## 2013-05-23 NOTE — Addendum Note (Signed)
Encounter addended by: Angelica Pou, RN on: 05/23/2013  9:40 AM<BR>     Documentation filed: Notes Section

## 2013-05-23 NOTE — Progress Notes (Signed)
Cardiac Rehabilitation Program Outcomes Report   Orientation:  12/14/2012 1st week report: 12/21/2012 Graduate Date:  tbd  Discharge Date:  tbd # of sessions completed: 3 DX: CAD/Stent X 1/ NSTEMI   Cardiologist: Woodroe Chen MD:  Fae Pippin Time:  11.00  A.  Exercise Program:  Tolerates exercise @ 3.69 METS for 15 minutes and Walk Test Results:  Pre: Pre Walk Test: resting HR 60, BP 122/78, O2 98%, RPE 7 and RPD 7, 6 minute HR  84, BP 150/82,  02 96%, RPE 9, and RPD 9. Post HR 51, BP 130/70, O2 98%, RPE 7 and RPD 7, Walked 1334ft at 2.56 mph at 2.95METS. B.  Mental Health:  Good mental attitude  C.  Education/Instruction/Skills  Accurately checks own pulse.  Rest:  57  Exercise:  82, Knows THR for exercise and Uses Perceived Exertion Scale and/or Dyspnea Scale  Uses Perceived Exertion Scale and/or Dyspnea Scale  D.  Nutrition/Weight Control/Body Composition:  Adherence to prescribed nutrition program: good    E.  Blood Lipids    Lab Results  Component Value Date   CHOL 132 11/24/2012   HDL 29* 11/24/2012   LDLCALC 72 11/24/2012   TRIG 155* 11/24/2012   CHOLHDL 4.6 11/24/2012    F.  Lifestyle Changes:  Making positive lifestyle changes and Not smoking:  Quit 1965  G.  Symptoms noted with exercise:  Asymptomatic  Report Completed By:  Lelon Huh. Devin Ganaway RN   Comments:  This is patients 1 st week report. Has done well his first week. He achieved a peak mets of 3.69. His resting HR was 57 and resting BP was 120/60. Peak HR was 80 and peak BP was 150/60. A report will follow upon his 18th visit. His halfway mark.

## 2013-05-23 NOTE — Addendum Note (Signed)
Encounter addended by: Angelica Pou, RN on: 05/23/2013  9:41 AM<BR>     Documentation filed: Clinical Notes

## 2013-05-23 NOTE — Progress Notes (Signed)
Cardiac Rehabilitation Program Outcomes Report   Orientation:  12/14/2012 Graduate Date:  03/20/2013 Discharge Date:  03/20/2013 # of sessions completed: 36 DX: CAD/ Stent X 1 /NSTEMI  Cardiologist: Woodroe Chen MD:  Fae Pippin Time:  11:00  A.  Exercise Program:  Tolerates exercise @ 3.78 METS for 15 minutes and Walk Test Results:  Pre: Post Walk Test : Resting HR 69, BP 122/60,  O2 97%, RPE 6 and RPD 6. 6 minute HR 83, Bp 132/60, O2 97%, RPE 10 and RPD 10, Post Hr 67 BP 118/62 O2 97%, RPE6 and RPD 6.  B.  Mental Health:  Good mental attitude  C.  Education/Instruction/Skills  Accurately checks own pulse.  Rest:  69  Exercise:  95, Knows THR for exercise, Uses Perceived Exertion Scale and/or Dyspnea Scale and Attended 11 education classes  Uses Perceived Exertion Scale and/or Dyspnea Scale  D.  Nutrition/Weight Control/Body Composition:  Adherence to prescribed nutrition program: good    E.  Blood Lipids    Lab Results  Component Value Date   CHOL 132 11/24/2012   HDL 29* 11/24/2012   LDLCALC 72 11/24/2012   TRIG 155* 11/24/2012   CHOLHDL 4.6 11/24/2012    F.  Lifestyle Changes:  Making positive lifestyle changes  G.  Symptoms noted with exercise:  Asymptomatic  Report Completed By:  Lelon Huh. Uno Esau    Comments:  This is patients graduation report. He achieved a peak METS of 3.78. He done very well while in rehab. His resting Hr was 69 and resting BP was 122/60. His peak HR was 95 and peak BP was 128/70. A call will be made to patient at 1 month, 6 months and 1 year to see if compliance is Met.

## 2013-05-23 NOTE — Addendum Note (Signed)
Encounter addended by: Angelica Pou, RN on: 05/23/2013 10:24 AM<BR>     Documentation filed: Notes Section

## 2013-05-23 NOTE — Progress Notes (Signed)
Cardiac Rehabilitation Program Outcomes Report   Orientation:  12/14/2012 Halfway Report: 01/28/2013 Graduate Date: tbd  Discharge Date:  tbd # of sessions completed: 59 DX: CAD/Stent X 1/NSTEMI  Cardiologist: Woodroe Chen MD:  Fae Pippin Time:  11:00  A.  Exercise Program:  Tolerates exercise @ 3.77 METS for 15 minutes  B.  Mental Health:  Good mental attitude  C.  Education/Instruction/Skills  Accurately checks own pulse.  Rest:  87  Exercise:  96, Knows THR for exercise and Uses Perceived Exertion Scale and/or Dyspnea Scale  Uses Perceived Exertion Scale and/or Dyspnea Scale  D.  Nutrition/Weight Control/Body Composition:  Adherence to prescribed nutrition program: good    E.  Blood Lipids    Lab Results  Component Value Date   CHOL 132 11/24/2012   HDL 29* 11/24/2012   LDLCALC 72 11/24/2012   TRIG 155* 11/24/2012   CHOLHDL 4.6 11/24/2012    F.  Lifestyle Changes:  Making positive lifestyle changes and Not smoking:  Quit 1965  G.  Symptoms noted with exercise:  Asymptomatic  Report Completed By:  Lelon Huh. Mckenleigh Tarlton RN   Comments:  This is patients halfway report. He has done well while in rehab. He achieved a peak METS  Of 3.77. His resting HR was 87 and resting BP was 120/68.His peak BP was 130/62 and HR was 96. A report will follow upon his 36 visit his graduation.

## 2013-06-04 ENCOUNTER — Inpatient Hospital Stay (HOSPITAL_COMMUNITY): Payer: Medicare Other

## 2013-06-04 ENCOUNTER — Encounter (HOSPITAL_COMMUNITY): Payer: Self-pay | Admitting: Emergency Medicine

## 2013-06-04 ENCOUNTER — Inpatient Hospital Stay (HOSPITAL_COMMUNITY)
Admission: EM | Admit: 2013-06-04 | Discharge: 2013-06-08 | DRG: 378 | Disposition: A | Discharge status: Home / Self Care | Payer: Medicare Other | Attending: Family Medicine | Admitting: Family Medicine

## 2013-06-04 ENCOUNTER — Telehealth: Payer: Self-pay | Admitting: *Deleted

## 2013-06-04 DIAGNOSIS — Z7902 Long term (current) use of antithrombotics/antiplatelets: Secondary | ICD-10-CM

## 2013-06-04 DIAGNOSIS — F3289 Other specified depressive episodes: Secondary | ICD-10-CM | POA: Diagnosis present

## 2013-06-04 DIAGNOSIS — I252 Old myocardial infarction: Secondary | ICD-10-CM

## 2013-06-04 DIAGNOSIS — K5733 Diverticulitis of large intestine without perforation or abscess with bleeding: Principal | ICD-10-CM | POA: Diagnosis present

## 2013-06-04 DIAGNOSIS — F4310 Post-traumatic stress disorder, unspecified: Secondary | ICD-10-CM | POA: Diagnosis present

## 2013-06-04 DIAGNOSIS — K922 Gastrointestinal hemorrhage, unspecified: Secondary | ICD-10-CM

## 2013-06-04 DIAGNOSIS — C9110 Chronic lymphocytic leukemia of B-cell type not having achieved remission: Secondary | ICD-10-CM | POA: Diagnosis present

## 2013-06-04 DIAGNOSIS — E7800 Pure hypercholesterolemia, unspecified: Secondary | ICD-10-CM | POA: Diagnosis present

## 2013-06-04 DIAGNOSIS — Z87891 Personal history of nicotine dependence: Secondary | ICD-10-CM

## 2013-06-04 DIAGNOSIS — K219 Gastro-esophageal reflux disease without esophagitis: Secondary | ICD-10-CM | POA: Diagnosis present

## 2013-06-04 DIAGNOSIS — N400 Enlarged prostate without lower urinary tract symptoms: Secondary | ICD-10-CM | POA: Diagnosis present

## 2013-06-04 DIAGNOSIS — Z7982 Long term (current) use of aspirin: Secondary | ICD-10-CM

## 2013-06-04 DIAGNOSIS — K921 Melena: Secondary | ICD-10-CM | POA: Diagnosis present

## 2013-06-04 DIAGNOSIS — F329 Major depressive disorder, single episode, unspecified: Secondary | ICD-10-CM | POA: Diagnosis present

## 2013-06-04 DIAGNOSIS — T4595XA Adverse effect of unspecified primarily systemic and hematological agent, initial encounter: Secondary | ICD-10-CM | POA: Diagnosis present

## 2013-06-04 DIAGNOSIS — Z9861 Coronary angioplasty status: Secondary | ICD-10-CM

## 2013-06-04 DIAGNOSIS — Z833 Family history of diabetes mellitus: Secondary | ICD-10-CM

## 2013-06-04 DIAGNOSIS — E785 Hyperlipidemia, unspecified: Secondary | ICD-10-CM | POA: Diagnosis present

## 2013-06-04 DIAGNOSIS — I2510 Atherosclerotic heart disease of native coronary artery without angina pectoris: Secondary | ICD-10-CM

## 2013-06-04 LAB — PROTIME-INR
INR: 1.09 (ref 0.00–1.49)
Prothrombin Time: 13.9 seconds (ref 11.6–15.2)

## 2013-06-04 LAB — CBC WITH DIFFERENTIAL/PLATELET
Basophils Absolute: 0 10*3/uL (ref 0.0–0.1)
Basophils Relative: 0 % (ref 0–1)
Eosinophils Absolute: 0 10*3/uL (ref 0.0–0.7)
Eosinophils Relative: 0 % (ref 0–5)
HCT: 48 % (ref 39.0–52.0)
Hemoglobin: 15.2 g/dL (ref 13.0–17.0)
Lymphocytes Relative: 84 % — ABNORMAL HIGH (ref 12–46)
Lymphs Abs: 39.5 10*3/uL — ABNORMAL HIGH (ref 0.7–4.0)
MCH: 30 pg (ref 26.0–34.0)
MCHC: 31.7 g/dL (ref 30.0–36.0)
MCV: 94.7 fL (ref 78.0–100.0)
Monocytes Absolute: 0.9 10*3/uL (ref 0.1–1.0)
Monocytes Relative: 2 % — ABNORMAL LOW (ref 3–12)
Neutro Abs: 6.6 10*3/uL (ref 1.7–7.7)
Neutrophils Relative %: 14 % — ABNORMAL LOW (ref 43–77)
Platelets: 252 10*3/uL (ref 150–400)
RBC: 5.07 MIL/uL (ref 4.22–5.81)
RDW: 14.7 % (ref 11.5–15.5)
WBC: 47 10*3/uL — ABNORMAL HIGH (ref 4.0–10.5)

## 2013-06-04 LAB — COMPREHENSIVE METABOLIC PANEL
ALT: 11 U/L (ref 0–53)
AST: 26 U/L (ref 0–37)
Albumin: 3.6 g/dL (ref 3.5–5.2)
Alkaline Phosphatase: 55 U/L (ref 39–117)
BUN: 18 mg/dL (ref 6–23)
CO2: 23 mEq/L (ref 19–32)
Calcium: 8.9 mg/dL (ref 8.4–10.5)
Chloride: 103 mEq/L (ref 96–112)
Creatinine, Ser: 1.38 mg/dL — ABNORMAL HIGH (ref 0.50–1.35)
GFR calc Af Amer: 59 mL/min — ABNORMAL LOW (ref >90)
GFR calc non Af Amer: 51 mL/min — ABNORMAL LOW (ref >90)
Glucose, Bld: 108 mg/dL — ABNORMAL HIGH (ref 70–99)
Potassium: 4.3 mEq/L (ref 3.7–5.3)
Sodium: 140 mEq/L (ref 137–147)
Total Bilirubin: 1.2 mg/dL (ref 0.3–1.2)
Total Protein: 6.8 g/dL (ref 6.0–8.3)

## 2013-06-04 LAB — TROPONIN I: Troponin I: <0.3 ng/mL (ref <0.30)

## 2013-06-04 LAB — CBC
HCT: 45.3 % (ref 39.0–52.0)
Hemoglobin: 14.5 g/dL (ref 13.0–17.0)
MCH: 30.3 pg (ref 26.0–34.0)
MCHC: 32 g/dL (ref 30.0–36.0)
MCV: 94.6 fL (ref 78.0–100.0)
Platelets: 230 10*3/uL (ref 150–400)
RBC: 4.79 MIL/uL (ref 4.22–5.81)
RDW: 14.7 % (ref 11.5–15.5)
WBC: 49.9 10*3/uL — ABNORMAL HIGH (ref 4.0–10.5)

## 2013-06-04 LAB — SAMPLE TO BLOOD BANK

## 2013-06-04 LAB — CLOSTRIDIUM DIFFICILE BY PCR: Toxigenic C. Difficile by PCR: NEGATIVE

## 2013-06-04 LAB — MRSA PCR SCREENING: MRSA by PCR: NEGATIVE

## 2013-06-04 MED ORDER — METRONIDAZOLE IN NACL 5-0.79 MG/ML-% IV SOLN
500.0000 mg | Freq: Three times a day (TID) | INTRAVENOUS | Status: DC
  Administered 2013-06-05 – 2013-06-08 (×11): 500 mg via INTRAVENOUS
  Filled 2013-06-04 (×17): qty 100

## 2013-06-04 MED ORDER — METRONIDAZOLE IN NACL 5-0.79 MG/ML-% IV SOLN
INTRAVENOUS | Status: AC
  Filled 2013-06-04: qty 200

## 2013-06-04 MED ORDER — ONDANSETRON HCL 4 MG/2ML IJ SOLN: 4.0000 mg | Freq: Four times a day (QID) | INTRAMUSCULAR | Status: DC | PRN

## 2013-06-04 MED ORDER — CIPROFLOXACIN IN D5W 400 MG/200ML IV SOLN
INTRAVENOUS | Status: AC
  Filled 2013-06-04: qty 200

## 2013-06-04 MED ORDER — IOHEXOL 300 MG/ML  SOLN
100.0000 mL | Freq: Once | INTRAMUSCULAR | Status: AC | PRN
  Administered 2013-06-04: 100 mL via INTRAVENOUS

## 2013-06-04 MED ORDER — ONDANSETRON HCL 4 MG PO TABS
4.0000 mg | ORAL_TABLET | Freq: Four times a day (QID) | ORAL | Status: DC | PRN
  Administered 2013-06-07: 4 mg via ORAL
  Filled 2013-06-04: qty 1

## 2013-06-04 MED ORDER — SODIUM CHLORIDE 0.9 % IJ SOLN
3.0000 mL | Freq: Two times a day (BID) | INTRAMUSCULAR | Status: DC
  Administered 2013-06-04: 3 mL via INTRAVENOUS

## 2013-06-04 MED ORDER — MORPHINE SULFATE 2 MG/ML IJ SOLN: 1.0000 mg | INTRAMUSCULAR | Status: DC | PRN

## 2013-06-04 MED ORDER — CIPROFLOXACIN IN D5W 400 MG/200ML IV SOLN
400.0000 mg | Freq: Two times a day (BID) | INTRAVENOUS | Status: DC
  Administered 2013-06-04 – 2013-06-07 (×7): 400 mg via INTRAVENOUS
  Filled 2013-06-04 (×12): qty 200

## 2013-06-04 MED ORDER — ACETAMINOPHEN 325 MG PO TABS
650.0000 mg | ORAL_TABLET | Freq: Four times a day (QID) | ORAL | Status: DC | PRN
  Administered 2013-06-06: 650 mg via ORAL
  Filled 2013-06-04: qty 2

## 2013-06-04 MED ORDER — ACETAMINOPHEN 650 MG RE SUPP: 650.0000 mg | Freq: Four times a day (QID) | RECTAL | Status: DC | PRN

## 2013-06-04 MED ORDER — IOHEXOL 300 MG/ML  SOLN
50.0000 mL | Freq: Once | INTRAMUSCULAR | Status: AC | PRN
  Administered 2013-06-04: 50 mL via ORAL

## 2013-06-04 MED ORDER — SODIUM CHLORIDE 0.9 % IV SOLN
INTRAVENOUS | Status: DC
  Administered 2013-06-04: 17:00:00 via INTRAVENOUS

## 2013-06-04 MED ORDER — SODIUM CHLORIDE 0.9 % IV SOLN
INTRAVENOUS | Status: DC
  Administered 2013-06-04 – 2013-06-07 (×7): via INTRAVENOUS

## 2013-06-04 MED ORDER — SODIUM CHLORIDE 0.9 % IJ SOLN
INTRAMUSCULAR | Status: AC
  Administered 2013-06-04: 3 mL via INTRAVENOUS
  Filled 2013-06-04: qty 350

## 2013-06-04 MED ORDER — ASPIRIN EC 81 MG PO TBEC
81.0000 mg | DELAYED_RELEASE_TABLET | Freq: Every day | ORAL | Status: DC
  Administered 2013-06-05 – 2013-06-07 (×3): 81 mg via ORAL
  Filled 2013-06-04 (×3): qty 1

## 2013-06-04 NOTE — H&P (Signed)
[facility] History and Physical  [name] ZOX:[id number] DOB: 06/12/1945 DOA: 06/04/2013   PCP: [name], MD  Specialists: He is followed by Dr. Herbie Baltimore with Cardiology. He's also has been seen by Dr. Jena Gauss with Gastroenterology  Chief Complaint: Blood per rectum  HPI: [name] is a 67 y.o. male with a past medical history of for non-ST elevation MI in June of this year with a drug-eluting stent placed to the right coronary artery. He also has a history of diverticulitis and CLL. Patient was in his usual state of health till this weekend when he had the multiple episodes of loose stools. He had some lower abdominal discomfort with these episodes of diarrhea. And, then last night he saw small amounts of blood in the stool. This morning he saw significant quantities of blood in the stool. He's had about 5-6 episodes of the same. He went to [facility] after he spoke to his primary care physician and he took some Imodium. However, then he had emesis, which was nonbloody. Because he continued to have blood in the stool he decided to come in to the hospital. He's had  some chills but denies any fever. No appreciable weight loss. Colonoscopy back in may of this year, which revealed diverticulosis, as well as polyps, which were removed. Denies being on any antibiotics recently. He went to his brother's house for Christmas dinner on Thursday. Nobody else has been sick. His last bowel movement was bloody at about 7:30 PM while he was in the emergency department. He admits to some degree of dizziness. Denies any syncopal episode. Denies any severe abdominal pain at this time.  Home Medications: Prior to Admission medications   Medication Sig Start Date End Date Taking? Authorizing Provider  ARTIFICIAL TEAR SOLUTION OP Apply 1 drop to eye as needed (dry eyes).   Yes Historical Provider, MD  aspirin EC 81 MG tablet Take 81 mg by mouth daily.    Yes Historical Provider, MD   Cholecalciferol (D3-1000) 1000 UNITS capsule Take 1,000 Units by mouth 2 (two) times a week.     Yes Historical Provider, MD  Cyanocobalamin (VITAMIN B-12) 2500 MCG SUBL Place 1 tablet under the tongue once a week.    Yes Historical Provider, MD  ezetimibe-simvastatin (VYTORIN) 10-40 MG per tablet Take 1 tablet by mouth at bedtime.   Yes Historical Provider, MD  folic acid (FOLVITE) 400 MCG tablet Take 400 mcg by mouth daily.   Yes Historical Provider, MD  hydrochlorothiazide (HYDRODIURIL) 25 MG tablet Take 1 tablet (25 mg total) by mouth daily. 03/12/13  Yes Marykay Lex, MD  HYDROcodone-acetaminophen (NORCO) 7.5-325 MG per tablet Take 1 tablet by mouth every 4 (four) hours as needed for pain.   Yes Historical Provider, MD  L-Arginine 500 MG CAPS Take 1 capsule by mouth every 30 (thirty) days.   Yes Historical Provider, MD  loperamide (IMODIUM A-D) 2 MG tablet Take 2 mg by mouth 4 (four) times daily as needed for diarrhea or loose stools.   Yes Historical Provider, MD  Menthol-Methyl Salicylate (MUSCLE RUB) 10-15 % CREA Apply 1 application topically as needed (for upper back pain).   Yes Historical Provider, MD  OVER THE COUNTER MEDICATION 1 tablet once a week. Herbal supplement for prostate health.   Yes Historical Provider, MD  prasugrel (EFFIENT) 10 MG TABS tablet Take 10 mg by mouth daily.   Yes Historical Provider, MD  nitroGLYCERIN (NITROSTAT) 0.4 MG SL tablet Place 0.4 mg under  the tongue every 5 (five) minutes x 3 doses as needed for chest pain. 11/24/12   Wilburt Finlay, PA-C    Allergies: No Known Allergies  Past Medical History: Past Medical History  Diagnosis Date  . Diverticulitis   . Hypercholesteremia   . 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 BPH (benign prostatic hyperplasia) 04/08/2011  . Gallstone   . Depression   . Headache(784.0)   . GERD (gastroesophageal reflux disease)   . H/O Non-ST elevation MI (NSTEMI) 11/23/12    RCA lesion - PCI with DES; moderate LAD  disease  . CAD S/P percutaneous coronary angioplasty 11/23/12    2 overlapping mRCA lesions - Xience Xpedition DES 2.75 mm x 18 mm (3.0 mm)  . PTSD (post-traumatic stress disorder)     With Depression - since MI    Past Surgical History  Procedure Laterality Date  . Appendectomy  [id number]  . Colonoscopy  2008    [id number] diverticula, diminutive polyp in the cecum, s/p bx Normal rectum. Tubular adenoma  . Colonoscopy N/A 10/17/2012    Procedure: COLONOSCOPY;  Surgeon: Corbin Ade, MD;  Location: [facility];  Service: Endoscopy;  Laterality: N/A;  2:00  . Cardiac catheterization  11/24/12    NSTEMI: Culprit = mid RCA 95% & 75%; LAD ~40-50%, Small RI ~60%; EF ~60%, mild inf-basal HK  . Coronary stent placement  11/24/12    mid RCA -- Xience eXp - 2.75 mm x 18 mm DES (3.29mm) , Dr. Herbie Baltimore    Social History: Patient lives with his wife. He denies smoking, alcohol or illicit drug use. She's quite independent with his daily activities.  Family History:  Family History  Problem Relation Age of Onset  . Diabetes Mother   . Cancer Father   . Colon cancer Neg Hx      Review of Systems - History obtained from the patient General ROS: positive for  - fatigue Psychological ROS: negative Ophthalmic ROS: negative ENT ROS: negative Allergy and Immunology ROS: negative Hematological and Lymphatic ROS: CLL Endocrine ROS: negative Respiratory ROS: no cough, shortness of breath, or wheezing Cardiovascular ROS: no chest pain or dyspnea on exertion Gastrointestinal ROS: as in hpi Genito-Urinary ROS: no dysuria, trouble voiding, or hematuria Musculoskeletal ROS: negative Neurological ROS: no TIA or stroke symptoms Dermatological ROS: negative  Physical Examination  Filed Vitals:   06/04/13 1558 06/04/13 1935  BP: 115/65 122/62  Pulse: 110 82  Temp: 97.3 F (36.3 C)   TempSrc: Oral   Resp: 16 20  Height: 5\' 9"  (1.753 m)   Weight: 79.379 kg (175 lb)   SpO2: 99% 99%     General appearance: alert, cooperative, appears stated age and no distress Head: Normocephalic, without obvious abnormality, atraumatic Eyes: conjunctivae/corneas clear. PERRL, EOM's intact.  Throat: lips, mucosa, and tongue normal; teeth and gums normal Neck: no adenopathy, no carotid bruit, no JVD, supple, symmetrical, trachea midline and thyroid not enlarged, symmetric, no tenderness/mass/nodules Resp: clear to auscultation bilaterally Cardio: regular rate and rhythm, S1, S2 normal, no murmur, click, rub or gallop GI: Soft. Mild tenderness appreciated in the left side, upper and lower quadrants, without any rebound, rigidity, or guarding. No masses, organomegaly. Bowel sounds are present. Rectal exam was done by the ED physician, which revealed frank blood. Extremities: extremities normal, atraumatic, no cyanosis or edema Pulses: 2+ and symmetric Skin: Skin color, texture, turgor normal. No rashes or lesions Lymph nodes: Cervical, supraclavicular, and axillary nodes normal. Neurologic: Alert and oriented X 3,  normal strength and tone. Normal symmetric reflexes. Normal coordination and gait  Laboratory Data: Results for orders placed during the hospital encounter of 06/04/13 (from the past 48 hour(s))  CBC WITH DIFFERENTIAL     Status: Abnormal   Collection Time    06/04/13  4:35 PM      Result Value Range   WBC 47.0 (*) 4.0 - 10.5 K/uL   RBC 5.07  4.22 - 5.81 MIL/uL   Hemoglobin 15.2  13.0 - 17.0 g/dL   HCT 69.6  29.5 - 28.4 %   MCV 94.7  78.0 - 100.0 fL   MCH 30.0  26.0 - 34.0 pg   MCHC 31.7  30.0 - 36.0 g/dL   RDW 13.2  44.0 - 10.2 %   Platelets 252  150 - 400 K/uL   Neutrophils Relative % 14 (*) 43 - 77 %   Lymphocytes Relative 84 (*) 12 - 46 %   Monocytes Relative 2 (*) 3 - 12 %   Eosinophils Relative 0  0 - 5 %   Basophils Relative 0  0 - 1 %   Neutro Abs 6.6  1.7 - 7.7 K/uL   Lymphs Abs 39.5 (*) 0.7 - 4.0 K/uL   Monocytes Absolute 0.9  0.1 - 1.0 K/uL   Eosinophils  Absolute 0.0  0.0 - 0.7 K/uL   Basophils Absolute 0.0  0.0 - 0.1 K/uL   WBC Morphology WHITE COUNT CONFIRMED ON SMEAR     Comment: ABSOLUTE LYMPHOCYTOSIS     ATYPICAL LYMPHOCYTES     INCREASED BANDS (>20% BANDS)     SMUDGE CELLS     HX OF CLL  COMPREHENSIVE METABOLIC PANEL     Status: Abnormal   Collection Time    06/04/13  4:35 PM      Result Value Range   Sodium 140  137 - 147 mEq/L   Comment: Please note change in reference range.   Potassium 4.3  3.7 - 5.3 mEq/L   Comment: Please note change in reference range.   Chloride 103  96 - 112 mEq/L   CO2 23  19 - 32 mEq/L   Glucose, Bld 108 (*) 70 - 99 mg/dL   BUN 18  6 - 23 mg/dL   Creatinine, Ser 7.25 (*) 0.50 - 1.35 mg/dL   Calcium 8.9  8.4 - 36.6 mg/dL   Total Protein 6.8  6.0 - 8.3 g/dL   Albumin 3.6  3.5 - 5.2 g/dL   AST 26  0 - 37 U/L   ALT 11  0 - 53 U/L   Alkaline Phosphatase 55  39 - 117 U/L   Total Bilirubin 1.2  0.3 - 1.2 mg/dL   GFR calc non Af Amer 51 (*) >90 mL/min   GFR calc Af Amer 59 (*) >90 mL/min   Comment: (NOTE)     The eGFR has been calculated using the CKD EPI equation.     This calculation has not been validated in all clinical situations.     eGFR's persistently <90 mL/min signify possible Chronic Kidney     Disease.  SAMPLE TO BLOOD BANK     Status: None   Collection Time    06/04/13  4:35 PM      Result Value Range   Blood Bank Specimen SAMPLE AVAILABLE FOR TESTING     Sample Expiration 06/07/2013    TROPONIN I     Status: None   Collection Time    06/04/13  4:35 PM  Result Value Range   Troponin I <0.30  <0.30 ng/mL   Comment:            Due to the release kinetics of cTnI,     a negative result within the first hours     of the onset of symptoms does not rule out     myocardial infarction with certainty.     If myocardial infarction is still suspected,     repeat the test at appropriate intervals.    Radiology Reports: No results found.  Electrocardiogram: Sinus rhythm at 93  beats per minute. Normal axis. Possible Q waves in inferior leads. No acute ST or T-wave changes are noted.  Problem List  Principal Problem:   Hematochezia Active Problems:   CLL (chronic lymphocytic leukemia)   CAD S/P percutaneous coronary angioplasty -- mid RCA: Xience expedition 2.75 mm x 18 mm (3.1 mm)   CAD (coronary artery disease)   Assessment: This is a 67 year old, Caucasian male, who presents with hematochezia. This is most likely diverticular. However, he has had diarrhea over the weekend and could have colitis as well. He has minimal abdominal tenderness in the left side. Complicating this situation is his history of coronary artery disease with a drug-eluting stent that was placed about 6 months ago.  Plan: #1 hematochezia: To be monitored in step down unit. CBCs will be checked closely. PT/INR will be checked. Gastroenterology will be consulted. It appears to be lower GI bleed. This is most likely diverticular, with colitis as the differential. Since he does have left abdominal tenderness we will proceed with CT of the abdomen and pelvis. We'll check stool for C. difficile considering his history of diarrhea. He'll be transfused as needed. Hemoglobin is stable at this time.  #2 history of coronary artery disease, status post MI in June, status post drug-eluting stent in RCA in June. This is a complicated situation considering his bleeding. Have discussed with Dr. Tresa Endo, who is covering for Dr. Herbie Baltimore who is the patient's cardiologist. Considering his rectal bleed at this time we will hold his Effient and continue aspirin starting tomorrow unless he has significant bleeding requiring transfusion. If he does have that, we will hold the aspirin as well. It may be reasonable to hold his effient for 2 days considering that he is 6 months post drug-eluting stent placement. However, there remains a small risk of thrombosis and restenosis. If his bleeding subsides spontaneously and he  doesn't require transfusions he may be switched over to Plavix per Dr. Tresa Endo. However, I would recommend that further discussions be held tomorrow by the patient's attending with cardiology.  #3 history of CLL: WBC count is stable. Continue to monitor.  DVT Prophylaxis: SCDs Code Status: Full code Family Communication: Discussed with the patient and his wife in detail  Disposition Plan: Admit to step down. Dr. Renard Matter to assume care in AM.  Further management decisions will depend on results of further testing and patient's response to treatment.  [facility]  [facility] Pager [phone]  If 7PM-7AM, please contact night-coverage [email or web address] Password [email or web address]  06/04/2013, 7:45 PM

## 2013-06-04 NOTE — ED Provider Notes (Signed)
CSN: [id number]     Arrival date & time 06/04/13  1548 History   This chart was scribed for [employer]. Rubin Payor, MD by Elveria Rising, ED scribe.  This patient was seen in room APA08/APA08 and the patient's care was started at 4:20 PM.   Chief Complaint  Patient presents with  . Emesis  . Rectal Bleeding    The history is provided by the patient. No language interpreter was used.   HPI Comments: [name] is a 67 y.o. male who presents to the Emergency Department complaining of sudden onset of hematchezia which began earlier today. The patient reports an episode of emesis which occurred last night and associated diarrhea which has persisted for the past four days. Patient did have an episode of emesis PTA. Patients shares that he has been experiencing generalized weakness and dizziness/lightheadiness. States that his condition is worsening. Patient denies any episodes of epistaxis or any other blood present (no bleeding brushing his teeth). Patient says he does have sick contacts (flu). Patient denies CP and SOB. Patient is currently taking anticoagulants (coumadin).   Past Medical History  Diagnosis Date  . Diverticulitis   . Hypercholesteremia   . BPH (benign prostatic hyperplasia) 04/08/2011  . Gallstone   . Depression   . Headache(784.0)   . GERD (gastroesophageal reflux disease)   . H/O Non-ST elevation MI (NSTEMI) 11/23/12    RCA lesion - PCI with DES; moderate LAD disease  . CAD S/P percutaneous coronary angioplasty 11/23/12    2 overlapping mRCA lesions - Xience Xpedition DES 2.75 mm x 18 mm (3.0 mm)  . PTSD (post-traumatic stress disorder)     With Depression - since MI  . CLL (chronic lymphocytic leukemia) 04/08/2011   Past Surgical History  Procedure Laterality Date  . Appendectomy  [id number]  . Colonoscopy  2008    [id number] diverticula, diminutive polyp in the cecum, s/p bx Normal rectum. Tubular adenoma  . Colonoscopy N/A 10/17/2012    Procedure: COLONOSCOPY;   Surgeon: Corbin Ade, MD;  Location: [facility];  Service: Endoscopy;  Laterality: N/A;  2:00  . Cardiac catheterization  11/24/12    NSTEMI: Culprit = mid RCA 95% & 75%; LAD ~40-50%, Small RI ~60%; EF ~60%, mild inf-basal HK  . Coronary stent placement  11/24/12    mid RCA -- Xience eXp - 2.75 mm x 18 mm DES (3.34mm) , Dr. Herbie Baltimore   Family History  Problem Relation Age of Onset  . Diabetes Mother   . Cancer Father   . Colon cancer Neg Hx    History  Substance Use Topics  . Smoking status: Former Smoker -- 0.25 packs/day for 2 years    Quit date: 06/07/1963  . Smokeless tobacco: Never Used  . Alcohol Use: No     Comment: occasionally drinks red wine    Review of Systems  Respiratory: Negative for shortness of breath.   Cardiovascular: Negative for chest pain.  Gastrointestinal: Positive for vomiting, diarrhea and blood in stool.  All other systems reviewed and are negative.   A complete 10 system review of systems was obtained and all systems are negative except as noted in the HPI and PMH.   Allergies  Review of patient's allergies indicates no known allergies.  Home Medications   Current Outpatient Rx  Name  Route  Sig  Dispense  Refill  . ARTIFICIAL TEAR SOLUTION OP   Ophthalmic   Apply 1 drop to eye as needed (dry eyes).         Marland Kitchen  aspirin EC 81 MG tablet   Oral   Take 81 mg by mouth daily.          . Cholecalciferol (D3-1000) 1000 UNITS capsule   Oral   Take 1,000 Units by mouth 2 (two) times a week.           . Cyanocobalamin (VITAMIN B-12) 2500 MCG SUBL   Sublingual   Place 1 tablet under the tongue once a week.          . ezetimibe-simvastatin (VYTORIN) 10-40 MG per tablet   Oral   Take 1 tablet by mouth at bedtime.         . folic acid (FOLVITE) 400 MCG tablet   Oral   Take 400 mcg by mouth daily.         . hydrochlorothiazide (HYDRODIURIL) 25 MG tablet   Oral   Take 1 tablet (25 mg total) by mouth daily.   30 tablet   11   .  HYDROcodone-acetaminophen (NORCO) 7.5-325 MG per tablet   Oral   Take 1 tablet by mouth every 4 (four) hours as needed for pain.         Marland Kitchen L-Arginine 500 MG CAPS   Oral   Take 1 capsule by mouth every 30 (thirty) days.         Marland Kitchen loperamide (IMODIUM A-D) 2 MG tablet   Oral   Take 2 mg by mouth 4 (four) times daily as needed for diarrhea or loose stools.         . Menthol-Methyl Salicylate (MUSCLE RUB) 10-15 % CREA   Topical   Apply 1 application topically as needed (for upper back pain).         Marland Kitchen OVER THE COUNTER MEDICATION      1 tablet once a week. Herbal supplement for prostate health.         . prasugrel (EFFIENT) 10 MG TABS tablet   Oral   Take 10 mg by mouth daily.         . nitroGLYCERIN (NITROSTAT) 0.4 MG SL tablet   Sublingual   Place 0.4 mg under the tongue every 5 (five) minutes x 3 doses as needed for chest pain.          Triage Vitals: BP 115/65  Pulse 110  Temp(Src) 97.3 F (36.3 C) (Oral)  Resp 16  Ht 5\' 9"  (1.753 m)  Wt 175 lb (79.379 kg)  BMI 25.83 kg/m2  SpO2 99% Physical Exam  Nursing note and vitals reviewed. Constitutional: He is oriented to person, place, and time. He appears well-developed and well-nourished. No distress.  HENT:  Head: Normocephalic and atraumatic.  Eyes: Conjunctivae are normal. Right eye exhibits no discharge. Left eye exhibits no discharge.  Neck: Normal range of motion.  Cardiovascular: Normal rate, regular rhythm and normal heart sounds.   No murmur heard. Mild tachy  Pulmonary/Chest: Effort normal and breath sounds normal. No respiratory distress. He has no wheezes. He has no rales.  Abdominal: Soft. Bowel sounds are normal. He exhibits no distension. There is no tenderness.  Musculoskeletal: He exhibits no edema.  No peripheral edema.    Neurological: He is alert and oriented to person, place, and time.  Skin: Skin is warm and dry. No petechiae noted. No pallor.  No jaundice. No bruising.     Psychiatric: He has a normal mood and affect. His behavior is normal.    ED Course  Procedures (including critical care time) DIAGNOSTIC STUDIES: Oxygen Saturation  is 99% on room air, normal by my interpretation.    COORDINATION OF CARE: 4:25 PM- Pt advised of plan for treatment which includes EKG, cardiac monitoring, and labs and pt agrees.    Labs Review Labs Reviewed  CBC WITH DIFFERENTIAL - Abnormal; Notable for the following:    WBC 47.0 (*)    Neutrophils Relative % 14 (*)    Lymphocytes Relative 84 (*)    Monocytes Relative 2 (*)    Lymphs Abs 39.5 (*)    All other components within normal limits  COMPREHENSIVE METABOLIC PANEL - Abnormal; Notable for the following:    Glucose, Bld 108 (*)    Creatinine, Ser 1.38 (*)    GFR calc non Af Amer 51 (*)    GFR calc Af Amer 59 (*)    All other components within normal limits  TROPONIN I  PROTIME-INR  SAMPLE TO BLOOD BANK   Imaging Review No results found.  EKG Interpretation    Date/Time:  Tuesday June 04 2013 16:28:10 EST Ventricular Rate:  93 PR Interval:  150 QRS Duration: 76 QT Interval:  342 QTC Calculation: 425 R Axis:   -21 Text Interpretation:  Normal sinus rhythm Inferior infarct (cited on or before 23-Nov-2012) Abnormal ECG When compared with ECG of 24-Nov-2012 05:05, Vent. rate has increased BY  38 BPM T wave inversion no longer evident in Inferior leads Confirmed by Axcel Horsch  MD, Carsten Carstarphen ([id number]) on 06/04/2013 5:05:47 PM            MDM   1. GI bleeding   2. Hematochezia   3. CAD (coronary artery disease)   4. CLL (chronic lymphocytic leukemia)    Patient with GI bleeding. Has a history of coronary artery disease. Hemoglobin is reassuring, however he states he has been a little dizzy. He is on dual antiplatelet therapy for recent non-STEMI. He'll be admitted to internal medicine for further evaluation.   I personally performed the services described in this documentation, which was scribed  in my presence. The recorded information has been reviewed and is accurate.       Juliet Rude. Rubin Payor, MD 06/04/13 2016

## 2013-06-04 NOTE — Telephone Encounter (Signed)
Returned call.  Left message to call back tomorrow before 4pm.  

## 2013-06-04 NOTE — ED Notes (Signed)
Pt reports diarrhea since Saturday. Pt. States vomiting last night. Pt. Reports blood in stool today.

## 2013-06-04 NOTE — Telephone Encounter (Signed)
Returning call.

## 2013-06-04 NOTE — Telephone Encounter (Signed)
Pt called in regards to having loose bloody stool, he said that the toilet was filled with blood. He called PCP antidiarrhea medication. He is on a blood thinner and wanted to know what to do.

## 2013-06-04 NOTE — Telephone Encounter (Signed)
Returned call.  Left message to call back before 4pm.  

## 2013-06-04 NOTE — ED Notes (Signed)
Called [facility]. Spoke to [place]. Nurse will call back when available to take report.

## 2013-06-05 ENCOUNTER — Encounter (HOSPITAL_COMMUNITY): Payer: Self-pay | Admitting: Gastroenterology

## 2013-06-05 DIAGNOSIS — K5732 Diverticulitis of large intestine without perforation or abscess without bleeding: Secondary | ICD-10-CM

## 2013-06-05 DIAGNOSIS — K922 Gastrointestinal hemorrhage, unspecified: Secondary | ICD-10-CM

## 2013-06-05 DIAGNOSIS — K625 Hemorrhage of anus and rectum: Secondary | ICD-10-CM

## 2013-06-05 LAB — COMPREHENSIVE METABOLIC PANEL
ALT: 8 U/L (ref 0–53)
AST: 17 U/L (ref 0–37)
Albumin: 2.9 g/dL — ABNORMAL LOW (ref 3.5–5.2)
Alkaline Phosphatase: 46 U/L (ref 39–117)
BUN: 17 mg/dL (ref 6–23)
CO2: 22 mEq/L (ref 19–32)
Calcium: 8 mg/dL — ABNORMAL LOW (ref 8.4–10.5)
Chloride: 105 mEq/L (ref 96–112)
Creatinine, Ser: 1.25 mg/dL (ref 0.50–1.35)
GFR calc Af Amer: 66 mL/min — ABNORMAL LOW (ref >90)
GFR calc non Af Amer: 57 mL/min — ABNORMAL LOW (ref >90)
Glucose, Bld: 86 mg/dL (ref 70–99)
Potassium: 3.9 mEq/L (ref 3.7–5.3)
Sodium: 140 mEq/L (ref 137–147)
Total Bilirubin: 1.3 mg/dL — ABNORMAL HIGH (ref 0.3–1.2)
Total Protein: 5.7 g/dL — ABNORMAL LOW (ref 6.0–8.3)

## 2013-06-05 LAB — TSH: TSH: 0.911 u[IU]/mL (ref 0.350–4.500)

## 2013-06-05 LAB — CBC
HCT: 40.6 % (ref 39.0–52.0)
HCT: 41.5 % (ref 39.0–52.0)
HCT: 38.5 % — ABNORMAL LOW (ref 39.0–52.0)
Hemoglobin: 12.8 g/dL — ABNORMAL LOW (ref 13.0–17.0)
Hemoglobin: 13.1 g/dL (ref 13.0–17.0)
Hemoglobin: 12.3 g/dL — ABNORMAL LOW (ref 13.0–17.0)
MCH: 29.8 pg (ref 26.0–34.0)
MCH: 29.8 pg (ref 26.0–34.0)
MCH: 30.1 pg (ref 26.0–34.0)
MCHC: 31.5 g/dL (ref 30.0–36.0)
MCHC: 31.6 g/dL (ref 30.0–36.0)
MCHC: 31.9 g/dL (ref 30.0–36.0)
MCV: 94.6 fL (ref 78.0–100.0)
MCV: 94.3 fL (ref 78.0–100.0)
MCV: 94.4 fL (ref 78.0–100.0)
Platelets: 216 10*3/uL (ref 150–400)
Platelets: 202 10*3/uL (ref 150–400)
Platelets: 220 10*3/uL (ref 150–400)
RBC: 4.29 MIL/uL (ref 4.22–5.81)
RBC: 4.4 MIL/uL (ref 4.22–5.81)
RBC: 4.08 MIL/uL — ABNORMAL LOW (ref 4.22–5.81)
RDW: 14.7 % (ref 11.5–15.5)
RDW: 14.6 % (ref 11.5–15.5)
RDW: 14.7 % (ref 11.5–15.5)
WBC: 42.5 10*3/uL — ABNORMAL HIGH (ref 4.0–10.5)
WBC: 41.5 10*3/uL — ABNORMAL HIGH (ref 4.0–10.5)
WBC: 38 10*3/uL — ABNORMAL HIGH (ref 4.0–10.5)

## 2013-06-05 MED ORDER — PANTOPRAZOLE SODIUM 40 MG PO TBEC
40.0000 mg | DELAYED_RELEASE_TABLET | Freq: Every day | ORAL | Status: DC
  Administered 2013-06-05 – 2013-06-08 (×4): 40 mg via ORAL
  Filled 2013-06-05 (×4): qty 1

## 2013-06-05 NOTE — Progress Notes (Signed)
UR chart review completed.  

## 2013-06-05 NOTE — Consult Note (Signed)
Referring Provider: Dr. Rito Ehrlich Primary Care Physician:  Dr. Renard Matter Primary Gastroenterologist:  Dr. Jena Gauss   Date of Admission: 06/04/13 Date of Consultation: 06/05/13  Reason for Consultation:  Diarrhea and rectal bleeding   HPI:  [name] is a pleasant 67 year old male with a history of NSTEMI in June and cardiac cath performed with PCI to the RCA. He has been on Effient since that time. He is known to our practice from several months ago, whereby he had a routine surveillance colonoscopy May 2014 by Dr. Jena Gauss revealing colonic diverticulosis and tubular adenoma. History also significant for sigmoid diverticulitis documented via CT in March of this year.   He presented this admission with multiple episodes of loose stool. Admitting Hgb 14.5. Today 12.8. Cdiff PCR negative. CT revealed diverticulosis of sigmoid colon with possibility of early diverticulitis.   Started having loose stools on Friday. 4-5 per day. Reports lower abdominal and LLQ pain. Reports chills, afebrile. Noted first bout of hematochezia on Sunday evening. Large amount. Rectal bleeding tapering off. He actually left the last episode of loose stool in toilet at bedside; this appears to be mainly dark brown with evidence of mild to moderate amount of fresh blood.   3-4 loose stools overnight. Pain improved since admission. No recent abx. Sees friends at the nursing home. 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 N/V while at [facility] a few days ago. Overall, symptoms improved. No further N/V. Wants to eat. Effient has been on hold since admission.    Past Medical History  Diagnosis Date  . Diverticulitis   . Hypercholesteremia   . BPH (benign prostatic hyperplasia) 04/08/2011  . Gallstone   . Depression   . Headache(784.0)   . GERD (gastroesophageal reflux disease)   . H/O Non-ST elevation MI (NSTEMI) 11/23/12    RCA lesion - PCI with DES; moderate LAD disease  . CAD S/P percutaneous  coronary angioplasty 11/23/12    2 overlapping mRCA lesions - Xience Xpedition DES 2.75 mm x 18 mm (3.0 mm)  . PTSD (post-traumatic stress disorder)     With Depression - since MI  . 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 Appendectomy  [id number]  . Colonoscopy  2008    [id number] diverticula, diminutive polyp in the cecum, s/p bx Normal rectum. Tubular adenoma  . Colonoscopy N/A 10/17/2012    Dr. Jena Gauss: colonic diverticulosis, tubular adenoma, surveillance due May 2019  . Cardiac catheterization  11/24/12    NSTEMI: Culprit = mid RCA 95% & 75%; LAD ~40-50%, Small RI ~60%; EF ~60%, mild inf-basal HK  . Coronary stent placement  11/24/12    mid RCA -- Xience eXp - 2.75 mm x 18 mm DES (3.53mm) , Dr. Herbie Baltimore    Prior to Admission medications   Medication Sig Start Date End Date Taking? Authorizing Provider  ARTIFICIAL TEAR SOLUTION OP Apply 1 drop to eye as needed (dry eyes).   Yes Historical Provider, MD  aspirin EC 81 MG tablet Take 81 mg by mouth daily.    Yes Historical Provider, MD  Cholecalciferol (D3-1000) 1000 UNITS capsule Take 1,000 Units by mouth 2 (two) times a week.     Yes Historical Provider, MD  Cyanocobalamin (VITAMIN B-12) 2500 MCG SUBL Place 1 tablet under the tongue once a week.    Yes Historical Provider, MD  ezetimibe-simvastatin (VYTORIN) 10-40 MG per tablet Take 1 tablet by mouth at bedtime.  Yes Historical Provider, MD  folic acid (FOLVITE) 400 MCG tablet Take 400 mcg by mouth daily.   Yes Historical Provider, MD  hydrochlorothiazide (HYDRODIURIL) 25 MG tablet Take 1 tablet (25 mg total) by mouth daily. 03/12/13  Yes Marykay Lex, MD  HYDROcodone-acetaminophen (NORCO) 7.5-325 MG per tablet Take 1 tablet by mouth every 4 (four) hours as needed for pain.   Yes Historical Provider, MD  L-Arginine 500 MG CAPS Take 1 capsule by mouth every 30 (thirty) days.   Yes Historical Provider, MD  loperamide (IMODIUM  A-D) 2 MG tablet Take 2 mg by mouth 4 (four) times daily as needed for diarrhea or loose stools.   Yes Historical Provider, MD  Menthol-Methyl Salicylate (MUSCLE RUB) 10-15 % CREA Apply 1 application topically as needed (for upper back pain).   Yes Historical Provider, MD  OVER THE COUNTER MEDICATION 1 tablet once a week. Herbal supplement for prostate health.   Yes Historical Provider, MD  prasugrel (EFFIENT) 10 MG TABS tablet Take 10 mg by mouth daily.   Yes Historical Provider, MD  nitroGLYCERIN (NITROSTAT) 0.4 MG SL tablet Place 0.4 mg under the tongue every 5 (five) minutes x 3 doses as needed for chest pain. 11/24/12   Wilburt Finlay, PA-C    Current Facility-Administered Medications  Medication Dose Route Frequency Provider Last Rate Last Dose  . 0.9 %  sodium chloride infusion   Intravenous Continuous Osvaldo Shipper, MD 125 mL/hr at 06/05/13 0300    . acetaminophen (TYLENOL) tablet 650 mg  650 mg Oral Q6H PRN Osvaldo Shipper, MD       Or  . acetaminophen (TYLENOL) suppository 650 mg  650 mg Rectal Q6H PRN Osvaldo Shipper, MD      . aspirin EC tablet 81 mg  81 mg Oral Daily Osvaldo Shipper, MD      . ciprofloxacin (CIPRO) IVPB 400 mg  400 mg Intravenous Q12H Osvaldo Shipper, MD   400 mg at 06/04/13 2300  . metroNIDAZOLE (FLAGYL) IVPB 500 mg  500 mg Intravenous Q8H Osvaldo Shipper, MD   500 mg at 06/05/13 0700  . morphine 2 MG/ML injection 1 mg  1 mg Intravenous Q3H PRN Osvaldo Shipper, MD      . ondansetron (ZOFRAN) tablet 4 mg  4 mg Oral Q6H PRN Osvaldo Shipper, MD       Or  . ondansetron (ZOFRAN) injection 4 mg  4 mg Intravenous Q6H PRN Osvaldo Shipper, MD      . sodium chloride 0.9 % injection 3 mL  3 mL Intravenous Q12H Osvaldo Shipper, MD   3 mL at 06/04/13 2210    Allergies as of 06/04/2013  . (No Known Allergies)    Family History  Problem Relation Age of Onset  . Diabetes Mother   . Cancer Father   . Colon cancer Neg Hx     History   Social History  . Marital Status: Married     Spouse Name: N/A    Number of Children: N/A  . Years of Education: N/A   Occupational History  . Not on file.   Social History Main Topics  . Smoking status: Former Smoker -- 0.25 packs/day for 2 years    Quit date: 06/07/1963  . Smokeless tobacco: Never Used  . Alcohol Use: No     Comment: occasionally drinks red wine  . Drug Use: No  . Sexual Activity: Not on file   Other Topics Concern  . Not on file   Social History  Narrative   Currently in Cardiac Rehabilitation   "former smoker". Does not drink EtOH   Married.    Review of Systems: Gen: see HPI CV: Denies chest pain, heart palpitations, syncope, edema  Resp: Denies shortness of breath with rest, cough, wheezing GI: see HPI GU : urinary hesitancy MS: Denies joint pain,swelling, cramping Derm: Denies rash, itching, dry skin Psych: Denies depression, anxiety,confusion, or memory loss  Physical Exam: Vital signs in last 24 hours: Temp:  [97.3 F (36.3 C)-98 F (36.7 C)] 97.8 F (36.6 C) (12/31 0000) Pulse Rate:  [67-110] 72 (12/31 0600) Resp:  [16-28] 19 (12/31 0600) BP: (96-129)/(54-68) 121/60 mmHg (12/31 0600) SpO2:  [92 %-99 %] 92 % (12/31 0600) Weight:  [168 lb 10.4 oz (76.5 kg)-175 lb (79.379 kg)] 168 lb 10.4 oz (76.5 kg) (12/30 2115) Last BM Date: 06/04/13 General:   Alert,  Well-developed, well-nourished, pleasant and cooperative in NAD Head:  Normocephalic and atraumatic. Eyes:  Sclera clear, no icterus.   Conjunctiva pink. Ears:  Normal auditory acuity. Nose:  No deformity, discharge,  or lesions. Mouth:  No deformity or lesions, dentition normal. Neck:  Supple; no masses or thyromegaly. Lungs:  Clear throughout to auscultation.   No wheezes, crackles, or rhonchi. No acute distress. Heart:  S1 S2 present, no murmurs, clicks, rubs,  or gallops. Abdomen:  Soft, +BS, tender to palpation LLQ specifically, discomfort in lower abdomen, no rebound or guarding, no peritoneal signs. No HSM. Rectal: external  exam only. No obvious hemorrhoid, anal fissure.  Msk:  Symmetrical without gross deformities. Normal posture. Extremities:  Without clubbing or edema. Neurologic:  Alert and  oriented x4;  grossly normal neurologically. Skin:  Intact without significant lesions or rashes. Cervical Nodes:  No significant cervical adenopathy. Psych:  Alert and cooperative. Normal mood and affect.  Intake/Output from previous day:   Intake/Output this shift:    Lab Results:  Recent Labs  06/04/13 1635 06/04/13 2200 06/05/13 0408  WBC 47.0* 49.9* 42.5*  HGB 15.2 14.5 12.8*  HCT 48.0 45.3 40.6  PLT 252 230 216   BMET  Recent Labs  06/04/13 1635 06/05/13 0408  NA 140 140  K 4.3 3.9  CL 103 105  CO2 23 22  GLUCOSE 108* 86  BUN 18 17  CREATININE 1.38* 1.25  CALCIUM 8.9 8.0*   LFT  Recent Labs  06/04/13 1635 06/05/13 0408  PROT 6.8 5.7*  ALBUMIN 3.6 2.9*  AST 26 17  ALT 11 8  ALKPHOS 55 46  BILITOT 1.2 1.3*   PT/INR  Recent Labs  06/04/13 1635  LABPROT 13.9  INR 1.09   CDIFF PCR: Negative  Studies/Results: Ct Abdomen Pelvis W Contrast  06/04/2013   CLINICAL DATA:  Abdominal pain. Hematochezia. Diarrhea since Saturday. Vomiting. CLL.  EXAM: CT ABDOMEN AND PELVIS WITH CONTRAST  TECHNIQUE: Multidetector CT imaging of the abdomen and pelvis was performed using the standard protocol following bolus administration of intravenous contrast.  CONTRAST:  50mL OMNIPAQUE IOHEXOL 300 MG/ML SOLN, OMNIPAQUE IOHEXOL 300 MG/ML SOLN  COMPARISON:  08/13/2012  FINDINGS: Slight fibrosis or atelectasis in the lung bases. Prominent axillary lymph nodes are noted bilaterally.  Cholelithiasis. The liver, spleen, pancreas, adrenal glands, kidneys, and inferior vena cava are unremarkable. Calcification of the aorta without aneurysm. Mildly enlarged lymph nodes throughout the retroperitoneal region, extending into the iliac chains bilaterally. Prominent lymph nodes in the mesentery and celiac axis.  These changes are similar to previous study and likely related to patient's history of  CLL. The stomach, small bowel, and colon are not abnormally distended. No free air or free fluid in the abdomen. Abdominal wall musculature appears intact.  Pelvis: The colon is diffusely fluid-filled consistent with history of diarrhea and liquid stool. There is diverticulosis of the sigmoid colon with infiltration in the fat around the sigmoid colon suggesting changes of diverticulitis. Appearance is similar to the previous study. No abscess or evidence of perforation. The appendix is not identified. The prostate gland is diffusely enlarged, measuring 5.5 x 4.9 cm. Bladder wall is not thickened. Internal and external iliac chain lymph nodes and groin lymph nodes are prominent, consistent with history of CLL. Degenerative changes in the lumbar spine. No destructive bone lesions appreciated.  IMPRESSION: Diverticulosis of the sigmoid colon with pericolonic infiltration consistent with early diverticulitis. No abscess. Lymphadenopathy is demonstrated throughout the visualized axillary, retroperitoneal, mesenteric, and pelvic areas bilaterally. This is consistent with history of CLL. Fluid in air-fluid levels demonstrated throughout the colon consistent with liquid stool. No colonic distention.   Electronically Signed   By: Burman Nieves M.D.   On: 06/04/2013 21:28    Impression: 67 year old male presenting with moderate volume hematochezia in the setting of multiple loose stools over past several days; historically, he has had a documented episode of diverticulitis in March 2014 and current CT this admission reveals possible recurrent early sigmoid diverticulitis. Colonoscopy is on file from May of this year, confirming colonic diverticulosis. Thus far, Cdiff negative but no other stool studies completed. In the setting of Effient, risk of bleeding increased, but this appears to be tapering off per patient. Hgb has dropped  around 2 grams since admission, now at 12.8. Likely multifactorial in the setting of acute rectal bleeding and hemodilution.   Would recommend continuing Cipro and Flagyl, with serial Hgb/Hct and monitoring for any continuation/worsening of rectal bleeding. Clinically, he appears to be improving with supportive measures. Will add GI pathogen panel for completeness' sake. Would hold off on flex sig due to possibility of diverticulitis unless evidence of worsening anemia, rectal bleeding, or no improvement in symptoms.   Plan: Continue Cipro and Flagyl IV May have clear liquids Serial monitoring of H/H GI pathogen panel Add PPI for GI prophylaxis Hold Effient for now   Nira Retort, ANP-BC [facility]    LOS: 1 day    06/05/2013, 8:04 AM

## 2013-06-05 NOTE — Consult Note (Signed)
REVIEWED. Ok TO START ASA AND PLAVIX BENEFITS OUTWEIGH THE RISKS DUE TO RECENT PCI.

## 2013-06-05 NOTE — Consult Note (Signed)
Primary cardiologist: Dr. Bryan Lemma Consulting cardiologist: Dr. Jonelle Sidle  Clinical Summary [name] is a 67 y.o.male presently admitted to the hospital with recent episodes of diarrhea and hematochezia. He states that symptoms began this past Friday, associated with a feeling of abdominal discomfort mainly when he had diarrhea. He denies any definite fevers, no weight loss, no nausea or emesis. He has been followed by Dr. Jena Gauss from a GI perspective, states that he had some colonic polyps removed previously, colonoscopy earlier in the year. Abdominal/pelvic CT scan shows diverticulosis of the sigmoid colon with pericolonic infiltration suggesting early diverticulitis. No obvious abscess was described. No colonic distention noted. He has not required transfusion, current hemoglobin 12.8 down from 15.2.  Cardiac history is noted below,he had an NSTEMI back in June with overlapping DES to the RCA. He has been stable without any angina symptoms and reports compliance with his medications. At the present time he is on aspirin, and Effient has been held temporarily following discussion with Dr. Tresa Endo by the hospitalist team on patient admission. Followup GI consultation is pending regarding need for any additional GI studies.  ECG reviewed, sinus rhythm with old inferior infarct pattern, troponin I negative.   No Known Allergies  Medications Scheduled Medications: . aspirin EC  81 mg Oral Daily  . ciprofloxacin  400 mg Intravenous Q12H  . metronidazole  500 mg Intravenous Q8H  . sodium chloride  3 mL Intravenous Q12H    Infusions: . sodium chloride 125 mL/hr at 06/05/13 0300    PRN Medications: acetaminophen, acetaminophen, morphine injection, ondansetron (ZOFRAN) IV, ondansetron   Past Medical History  Diagnosis Date  . Diverticulitis   . Hypercholesteremia   . BPH (benign prostatic hyperplasia) 04/08/2011  . Gallstone   . Depression   . Headache(784.0)   . GERD  (gastroesophageal reflux disease)   . H/O Non-ST elevation MI (NSTEMI) 11/23/12    RCA lesion - PCI with DES; moderate LAD disease  . CAD S/P percutaneous coronary angioplasty 11/23/12    2 overlapping mRCA lesions - Xience Xpedition DES 2.75 mm x 18 mm (3.0 mm)  . PTSD (post-traumatic stress disorder)     With Depression - since MI  . 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 Appendectomy  [id number]  . Colonoscopy  2008    [id number] diverticula, diminutive polyp in the cecum, s/p bx Normal rectum. Tubular adenoma  . Colonoscopy N/A 10/17/2012    Dr. Jena Gauss: colonic diverticulosis, tubular adenoma, surveillance due May 2019  . Cardiac catheterization  11/24/12    NSTEMI: Culprit = mid RCA 95% & 75%; LAD ~40-50%, Small RI ~60%; EF ~60%, mild inf-basal HK  . Coronary stent placement  11/24/12    Mid RCA -- Xience eXp - 2.75 mm x 18 mm DES (3.87mm) , Dr. Herbie Baltimore    Family History  Problem Relation Age of Onset  . Diabetes Mother   . Cancer Father   . Colon cancer Neg Hx     Social History [name] reports that he quit smoking about 50 years ago. His smoking use included Cigarettes. He has a .5 pack-year smoking history. He has never used smokeless tobacco. [name] reports that he does not drink alcohol.  Review of Systems No angina or palpitations, no unusual shortness of breath. Otherwise in his usual state of health until the recent symptoms.  Physical Examination Blood pressure 121/60, pulse 72, temperature 97.8 F (  36.6 C), temperature source Oral, resp. rate 19, height 5\' 9"  (1.753 m), weight 168 lb 10.4 oz (76.5 kg), SpO2 92.00%. No intake or output data in the 24 hours ending 06/05/13 0824  Telemetry: Sinus rhythm.  The patient appears comfortable at rest. HEENT: Conjunctiva and lids normal, oropharynx clear. Neck: Supple, no elevated JVP or carotid bruits, no thyromegaly. Lungs: Clear to auscultation,  nonlabored breathing at rest. Cardiac: Regular rate and rhythm, no S3 or significant systolic murmur, no pericardial rub. Abdomen: Soft, nontender, bowel sounds present, no guarding or rebound. Extremities: No pitting edema, distal pulses 2+. Skin: Warm and dry. Musculoskeletal: No kyphosis. Neuropsychiatric: Alert and oriented x3, affect grossly appropriate.   Lab Results  Basic Metabolic Panel:  Recent Labs Lab 06/04/13 1635 06/05/13 0408  NA 140 140  K 4.3 3.9  CL 103 105  CO2 23 22  GLUCOSE 108* 86  BUN 18 17  CREATININE 1.38* 1.25  CALCIUM 8.9 8.0*    Liver Function Tests:  Recent Labs Lab 06/04/13 1635 06/05/13 0408  AST 26 17  ALT 11 8  ALKPHOS 55 46  BILITOT 1.2 1.3*  PROT 6.8 5.7*  ALBUMIN 3.6 2.9*    CBC:  Recent Labs Lab 06/04/13 1635 06/04/13 2200 06/05/13 0408  WBC 47.0* 49.9* 42.5*  NEUTROABS 6.6  --   --   HGB 15.2 14.5 12.8*  HCT 48.0 45.3 40.6  MCV 94.7 94.6 94.6  PLT 252 230 216    Cardiac Enzymes:  Recent Labs Lab 06/04/13 1635  TROPONINI <0.30    Impression  1. Presentation with diarrhea and hematochezia, concern for lower GI bleed. Has not required PRBC transfusion as yet, hemoglobin 15.2 down to 12.8. CT of the abdomen/pelvis suggests early diverticulitis. GI consultation is pending. Patient is on aspirin, Effient has been held for the time being.  2. CAD status post NSTEMI in June of this year with overlapping DES to the RCA and residual moderate LAD disease that is being managed medically. Patient has been clinically stable. ECG reviewed.  3. Hyperlipidemia, on statin therapy as outpatient.  4. CLL.  Recommendations  Reviewed history and discussed current situation with Dr. Herbie Baltimore by phone. Would agree with continuing aspirin if possible and hold Effient for the time being. Depending on whether he needs further GI studies or has evidence of continued active bleeding, we can better determine how long to keep him off  dual antiplatelet therapy. Since he is 6 months out from his DES intervention, one feels better about holding Effient, however there is still a small risk of stent thrombosis even at this point. It may actually be possible to simplify his regimen to Plavix alone at this time, once safe from a GI perspective.  Jonelle Sidle, M.D., F.A.C.C.

## 2013-06-05 NOTE — Care Management Note (Signed)
    Page 1 of 1   06/05/2013     1:25:11 PM   CARE MANAGEMENT NOTE 06/05/2013  Patient:  [name], [name]   Account Number:  [id number]  Date Initiated:  06/05/2013  Documentation initiated by:  Sharrie Rothman  Subjective/Objective Assessment:   Pt admitted from home with rectal bleed. Pt lives with his wife and will return home at discharge. Pt is independent with ADL's.     Action/Plan:   No CM needs noted.   Anticipated DC Date:  06/08/2013   Anticipated DC Plan:  HOME/SELF CARE      DC Planning Services  CM consult      Choice offered to / List presented to:             Status of service:  Completed, signed off Medicare Important Message given?   (If response is "NO", the following Medicare IM given date fields will be blank) Date Medicare IM given:   Date Additional Medicare IM given:    Discharge Disposition:  HOME/SELF CARE  Per UR Regulation:    If discussed at Long Length of Stay Meetings, dates discussed:    Comments:  06/05/13 1325 Arlyss Queen, RN BSN CM

## 2013-06-06 DIAGNOSIS — Z8719 Personal history of other diseases of the digestive system: Secondary | ICD-10-CM

## 2013-06-06 DIAGNOSIS — R195 Other fecal abnormalities: Secondary | ICD-10-CM

## 2013-06-06 DIAGNOSIS — K921 Melena: Secondary | ICD-10-CM

## 2013-06-06 MED ORDER — CLOPIDOGREL BISULFATE 75 MG PO TABS
75.0000 mg | ORAL_TABLET | Freq: Every day | ORAL | Status: DC
  Administered 2013-06-06 – 2013-06-08 (×3): 75 mg via ORAL
  Filled 2013-06-06 (×3): qty 1

## 2013-06-06 NOTE — Progress Notes (Signed)
Subjective: Since I last evaluated the patient HE HAD A SMALL AMOUNT OF BLOOD IN STOOL. ABD PAIN IMPROVED. OCCASIONAL NAUSEA. NO VOMITING. CONTINUES WITHLOOSE STOOLS BUT IMPROVED.  Objective: Vital signs in last 24 hours: Temp:  [97.9 F (36.6 C)-98.5 F (36.9 C)] 97.9 F (36.6 C) (01/01 0400) Pulse Rate:  [63-82] 63 (01/01 0700) Resp:  [15-34] 27 (01/01 0800) BP: (106-139)/(54-65) 127/64 mmHg (01/01 0800) SpO2:  [90 %-99 %] 93 % (01/01 0700) Weight:  [172 lb 13.5 oz (78.4 kg)] 172 lb 13.5 oz (78.4 kg) (01/01 0500) Last BM Date: 06/05/13  Intake/Output from previous day: 12/31 0701 - 01/01 0700 In: 5053.6 [P.O.:600; I.V.:3353.6; IV Piggyback:1100] Out: -  Intake/Output this shift:    General appearance: alert, cooperative and no distress Resp: clear to auscultation bilaterally Cardio: regular rate and rhythm GI: soft, MILD TTP IN LLQ NO REBOUND OR GUARDING; bowel sounds normal;   Lab Results:  Recent Labs  06/05/13 0408 06/05/13 0948 06/05/13 1602  WBC 42.5* 41.5* 38.0*  HGB 12.8* 13.1 12.3*  HCT 40.6 41.5 38.5*  PLT 216 202 220   BMET  Recent Labs  06/04/13 1635 06/05/13 0408  NA 140 140  K 4.3 3.9  CL 103 105  CO2 23 22  GLUCOSE 108* 86  BUN 18 17  CREATININE 1.38* 1.25  CALCIUM 8.9 8.0*   LFT  Recent Labs  06/05/13 0408  PROT 5.7*  ALBUMIN 2.9*  AST 17  ALT 8  ALKPHOS 46  BILITOT 1.3*   PT/INR  Recent Labs  06/04/13 1635  LABPROT 13.9  INR 1.09   Hepatitis Panel No results found for this basename: HEPBSAG, HCVAB, HEPAIGM, HEPBIGM,  in the last 72 hours C-Diff No results found for this basename: CDIFFTOX,  in the last 72 hours Fecal Lactopherrin No results found for this basename: FECLLACTOFRN,  in the last 72 hours  Studies/Results: Ct Abdomen Pelvis W Contrast  06/04/2013   CLINICAL DATA:  Abdominal pain. Hematochezia. Diarrhea since Saturday. Vomiting. CLL.  EXAM: CT ABDOMEN AND PELVIS WITH CONTRAST  TECHNIQUE: Multidetector CT  imaging of the abdomen and pelvis was performed using the standard protocol following bolus administration of intravenous contrast.  CONTRAST:  39mL OMNIPAQUE IOHEXOL 300 MG/ML SOLN, 169mL OMNIPAQUE IOHEXOL 300 MG/ML SOLN  COMPARISON:  08/13/2012  FINDINGS: Slight fibrosis or atelectasis in the lung bases. Prominent axillary lymph nodes are noted bilaterally.  Cholelithiasis. The liver, spleen, pancreas, adrenal glands, kidneys, and inferior vena cava are unremarkable. Calcification of the aorta without aneurysm. Mildly enlarged lymph nodes throughout the retroperitoneal region, extending into the iliac chains bilaterally. Prominent lymph nodes in the mesentery and celiac axis. These changes are similar to previous study and likely related to patient's history of CLL. The stomach, small bowel, and colon are not abnormally distended. No free air or free fluid in the abdomen. Abdominal wall musculature appears intact.  Pelvis: The colon is diffusely fluid-filled consistent with history of diarrhea and liquid stool. There is diverticulosis of the sigmoid colon with infiltration in the fat around the sigmoid colon suggesting changes of diverticulitis. Appearance is similar to the previous study. No abscess or evidence of perforation. The appendix is not identified. The prostate gland is diffusely enlarged, measuring 5.5 x 4.9 cm. Bladder wall is not thickened. Internal and external iliac chain lymph nodes and groin lymph nodes are prominent, consistent with history of CLL. Degenerative changes in the lumbar spine. No destructive bone lesions appreciated.  IMPRESSION: Diverticulosis of the sigmoid colon with  pericolonic infiltration consistent with early diverticulitis. No abscess. Lymphadenopathy is demonstrated throughout the visualized axillary, retroperitoneal, mesenteric, and pelvic areas bilaterally. This is consistent with history of CLL. Fluid in air-fluid levels demonstrated throughout the colon consistent with  liquid stool. No colonic distention.   Electronically Signed   By: Lucienne Capers M.D.   On: 06/04/2013 21:28    Medications: I have reviewed the patient's current medications.  Assessment/Plan: ADMITTED WITH DIVERTICULITIS COMPLICATED BY BLEEDING DUE TO ANTICOAGULATION. CLINICALLY IMPROVED. HB STABLE.  PLAN: 1. START PLAVIX TODAY. 2. CONTINUE ASA. 3. DAILY PPI 4. ADVANCE DIET 5. CONSIDER D/C IN 24-48 HRS IF HB STABLES AND BLEEDING IMPROVED.    LOS: 2 days   Tylicia Sherman 06/06/2013, 9:00 AM

## 2013-06-06 NOTE — Progress Notes (Signed)
[name], [name]                ACCOUNT NO.:  [id number]  MEDICAL RECORD NO.:  [id number]  LOCATION:  IC11                          FACILITY:  [facility]  PHYSICIAN:  Yechiel Erny G. Everette Rank, MD   DATE OF BIRTH:  12/15/1945  DATE OF PROCEDURE: DATE OF DISCHARGE:                                PROGRESS NOTE   This patient was admitted with lower abdominal pain and gastrointestinal bleeding.  He continues to have some pain in left lower quadrant, but not as much bleeding.  He has been seen both by Gastroenterology Service and Cardiology Service, has been taken off of Effient, and has been started on p.o. Cipro and Flagyl, appears to be improving with supportive measures.  A CT of abdomen was done, showed evidence of early sigmoid diverticulitis with negative C. diff.  The patient's hemoglobin remained stable.  Hemoglobin 12.3, hematocrit 38.5.  PHYSICAL EXAMINATION:  GENERAL:  Alert male. VITAL SIGNS:  Blood pressure 120/62, respirations 15, pulse 63, temperature 97.9. HEENT:  Eyes:  PERRLA.  TM negative.  Oropharynx benign. NECK:  Supple.  No JVD or thyroid abnormalities. HEART:  Regular rhythm. LUNGS:  Clear to P and A. ABDOMEN:  Slight tenderness in right lower quadrant. EXTREMITIES:  Free of edema.  ASSESSMENT: 1. Hematochezia. 2. Diverticulitis. 3. Coronary artery disease, status post non-ST segment elevation     myocardial infarction and hyperlipidemia.  Cardiology Service     recommend holding Effient, but continuing aspirin.  Possibly in the     future, the regimen of Plavix alone could be sufficient.  PLAN:  Hold Effient.  Continue Cipro and Flagyl IV.  Continue advanced diet.     Kenta Laster G. Everette Rank, MD     AGM/MEDQ  D:  06/06/2013  T:  06/06/2013  Job:  [id number]

## 2013-06-06 NOTE — Progress Notes (Signed)
[name], [name]                ACCOUNT NO.:  [id number]  MEDICAL RECORD NO.:  [id number]  LOCATION:  IC11                          FACILITY:  [facility]  PHYSICIAN:  Kiani Wurtzel G. Everette Rank, MD   DATE OF BIRTH:  February 06, 1946  DATE OF PROCEDURE: DATE OF DISCHARGE:                                PROGRESS NOTE   SUBJECTIVE:  This patient developed diarrhea over the weekend and started having some rectal bleeding.  He presented to the ED in this fashion.  GI Service was consulted.  His condition remains stable.  CT of the abdomen and pelvis was completed.  Hemoglobin remains stable. The patient does have coronary artery disease with previous MI.  He had been placed on Effient, and this is being held.  GI Service and Cardiology will see the patient today.  OBJECTIVE:  VITAL SIGNS:  Blood pressure 121/60, respirations 19, pulse 72, temp 97.8. HEENT:  Eyes, PERRLA.  TMs, negative.  Oropharynx, benign. NECK:  Supple.  No JVD or thyroid abnormalities. HEART:  Regular rhythm.  No murmurs. LUNGS:  Clear to P and A. ABDOMEN:  No palpable organs or masses.  No organomegaly.  ASSESSMENT:  The patient was admitted with rectal bleeding.  There is some question of whether or not this is diverticular bleed or colitis. CT was completed of abdomen showing evidence of diverticulosis sigmoid colon and evidence of diverticulitis.  The patient is being treated with Cipro and Flagyl, Cipro 400 mg b.i.d., Flagyl 500 mg every 8 hours.  He will be seen by GI Service.  Also, a history of coronary artery disease with previous myocardial infarction.  He has had previous post drug- eluting stent placement.  Cardiology has been asked to see the patient in regards to this problem with holding Effient for the time being.     [name], MD     AGM/MEDQ  D:  06/05/2013  T:  06/06/2013  Job:  [id number]

## 2013-06-07 ENCOUNTER — Telehealth: Payer: Self-pay | Admitting: Gastroenterology

## 2013-06-07 DIAGNOSIS — K625 Hemorrhage of anus and rectum: Secondary | ICD-10-CM

## 2013-06-07 DIAGNOSIS — K5732 Diverticulitis of large intestine without perforation or abscess without bleeding: Secondary | ICD-10-CM

## 2013-06-07 LAB — GI PATHOGEN PANEL BY PCR, STOOL
C difficile toxin A/B: NEGATIVE
Campylobacter by PCR: NEGATIVE
Cryptosporidium by PCR: NEGATIVE
E coli (ETEC) LT/ST: NEGATIVE
E coli (STEC): NEGATIVE
E coli 0157 by PCR: NEGATIVE
G lamblia by PCR: NEGATIVE
Norovirus GI/GII: NEGATIVE
Rotavirus A by PCR: NEGATIVE
Salmonella by PCR: NEGATIVE
Shigella by PCR: NEGATIVE

## 2013-06-07 NOTE — Progress Notes (Signed)
    Primary cardiologist: Dr. Glenetta Hew  Consulting cardiologist: Dr. Satira Sark  Subjective:    No chest pain, palpitations, or shortness of breath.  Objective:   Temp:  [98.1 F (36.7 C)-98.3 F (36.8 C)] 98.1 F (36.7 C) (01/02 0400) Pulse Rate:  [63-77] 66 (01/02 0600) Resp:  [16-27] 18 (01/02 0600) BP: (126-140)/(62-73) 137/73 mmHg (01/02 0000) SpO2:  [90 %-97 %] 93 % (01/02 0600) Weight:  [176 lb 12.9 oz (80.2 kg)] 176 lb 12.9 oz (80.2 kg) (01/02 0500) Last BM Date: 06/07/13  Filed Weights   06/04/13 2115 06/06/13 0500 06/07/13 0500  Weight: 168 lb 10.4 oz (76.5 kg) 172 lb 13.5 oz (78.4 kg) 176 lb 12.9 oz (80.2 kg)    Intake/Output Summary (Last 24 hours) at 06/07/13 1421 Last data filed at 06/07/13 0600  Gross per 24 hour  Intake   5679 ml  Output      0 ml  Net   5679 ml    Exam:  General: No distress.  Lungs: Clear, nonlabored.  Cardiac: RRR, no gallop.  Extremities: No pitting.   Lab Results:  Basic Metabolic Panel:  Recent Labs Lab 06/04/13 1635 06/05/13 0408  NA 140 140  K 4.3 3.9  CL 103 105  CO2 23 22  GLUCOSE 108* 86  BUN 18 17  CREATININE 1.38* 1.25  CALCIUM 8.9 8.0*    Liver Function Tests:  Recent Labs Lab 06/04/13 1635 06/05/13 0408  AST 26 17  ALT 11 8  ALKPHOS 55 46  BILITOT 1.2 1.3*  PROT 6.8 5.7*  ALBUMIN 3.6 2.9*    CBC:  Recent Labs Lab 06/05/13 0408 06/05/13 0948 06/05/13 1602  WBC 42.5* 41.5* 38.0*  HGB 12.8* 13.1 12.3*  HCT 40.6 41.5 38.5*  MCV 94.6 94.3 94.4  PLT 216 202 220     Medications:   Scheduled Medications: . aspirin EC  81 mg Oral Daily  . ciprofloxacin  400 mg Intravenous Q12H  . clopidogrel  75 mg Oral Q breakfast  . metronidazole  500 mg Intravenous Q8H  . pantoprazole  40 mg Oral Daily  . sodium chloride  3 mL Intravenous Q12H     Infusions: . sodium chloride 125 mL/hr at 06/07/13 0600     PRN Medications:  acetaminophen, acetaminophen, morphine  injection, ondansetron (ZOFRAN) IV, ondansetron   Assessment:   1. Diverticulitis complicated by bleeding in the setting of antiplatelet therapy. Seems to be improving. Currently on Cipro and Flagyl with GI followup.  2. CAD status post NSTEMI in June of 2014 with overlapping DES to the RCA and residual moderate LAD disease that is being managed medically. Patient has been clinically stable. ECG reviewed. Effient has been discontinued. He was continued on aspirin and recently Plavix was added to his regimen.  Plan/Discussion:    Please see original consultation note. I discussed Mr. Trentman's cardiac medical management with Dr. Ellyn Hack, his primary cardiologist. Would simplify his regimen to Plavix alone at this time.   Satira Sark, M.D., F.A.C.C.

## 2013-06-07 NOTE — Progress Notes (Signed)
NAMELEMUEL, BOODRAM                ACCOUNT NO.:  [id number]  MEDICAL RECORD NO.:  [id number]  LOCATION:  IC11                          FACILITY:  [facility]  PHYSICIAN:  Audrielle Vankuren G. Everette Rank, MD   DATE OF BIRTH:  [birth year]  DATE OF PROCEDURE: DATE OF DISCHARGE:                                PROGRESS NOTE   This patient continued to feel better, has less abdominal pain, minimal bleeding.  He remains on IV Cipro and Flagyl, and has had less abdominal tenderness.  He does have by x-ray evidence of sigmoid diverticulitis.  OBJECTIVE:  VITAL SIGNS:  Blood pressure 137/73, respirations 18, pulse 66, temp 98.1. HEENT:  Eyes, PERRLA.  TM negative.  Oropharynx benign. NECK:  Supple.  No JVD or thyroid abnormalities. HEART:  Regular rhythm.  No murmurs. LUNGS:  Clear to P and A. ABDOMEN:  Slight tenderness over lower abdomen and left lower quadrant. EXTREMITIES:  Free of edema.  ASSESSMENT: 1. Hematochezia, improved. 2. Diverticulitis.  PLAN: 1. To continue current IV Cipro and Flagyl. 2. Will be transitioned to p.o. Cipro and Flagyl. 3. Coronary artery disease, status post non ST-segment elevation     myocardial infarction and hyperlipidemia withholding Effient, but     continuing aspirin and Plavix.  The patient's condition stable,     could move to the Med surg floor today with possibility of     discharge tomorrow.     Zniyah Midkiff G. Everette Rank, MD     AGM/MEDQ  D:  06/07/2013  T:  06/07/2013  Job:  [id number]

## 2013-06-07 NOTE — Progress Notes (Signed)
REVIEWED.  NO INPT GI COVERAGE JAN 2 5 PM UNTIL Jun 10 728.

## 2013-06-07 NOTE — Telephone Encounter (Signed)
PT ADMITTED DEC 30 FOR DIVERTICULITIS COMPLICATED BY LGIB ON EFFIENT. RECENT PCI. CARDIOLOGY RECOMMNEDS PLAVIX ALONE. OPV IN 4 WEEKS WITH DR. Gala Romney E30.

## 2013-06-07 NOTE — Progress Notes (Signed)
Subjective:  Feels better. Abdomin still sore. BM today without blood.   Objective: Vital signs in last 24 hours: Temp:  [98.1 F (36.7 C)-98.3 F (36.8 C)] 98.1 F (36.7 C) (01/02 0400) Pulse Rate:  [63-77] 66 (01/02 0600) Resp:  [16-31] 18 (01/02 0600) BP: (126-140)/(62-73) 137/73 mmHg (01/02 0000) SpO2:  [90 %-97 %] 93 % (01/02 0600) Weight:  [176 lb 12.9 oz (80.2 kg)] 176 lb 12.9 oz (80.2 kg) (01/02 0500) Last BM Date: 06/07/13 General:   Alert,  Well-developed, well-nourished, pleasant and cooperative in NAD Head:  Normocephalic and atraumatic. Eyes:  Sclera clear, no icterus.  Abdomen:  Soft,  nondistended. Mild LLQ tenderness.  Normal bowel sounds, without guarding, and without rebound.   Extremities:  Without clubbing, deformity or edema. Neurologic:  Alert and  oriented x4;  grossly normal neurologically. Skin:  Intact without significant lesions or rashes. Psych:  Alert and cooperative. Normal mood and affect.  Intake/Output from previous day: 01/01 0701 - 01/02 0700 In: 5679 [P.O.:420; I.V.:4559; IV KYHCWCBJS:283] Out: -  Intake/Output this shift:    Lab Results: CBC  Recent Labs  06/05/13 0408 06/05/13 0948 06/05/13 1602  WBC 42.5* 41.5* 38.0*  HGB 12.8* 13.1 12.3*  HCT 40.6 41.5 38.5*  MCV 94.6 94.3 94.4  PLT 216 202 220   BMET  Recent Labs  06/04/13 1635 06/05/13 0408  NA 140 140  K 4.3 3.9  CL 103 105  CO2 23 22  GLUCOSE 108* 86  BUN 18 17  CREATININE 1.38* 1.25  CALCIUM 8.9 8.0*   LFTs  Recent Labs  06/04/13 1635 06/05/13 0408  BILITOT 1.2 1.3*  ALKPHOS 55 46  AST 26 17  ALT 11 8  PROT 6.8 5.7*  ALBUMIN 3.6 2.9*   No results found for this basename: LIPASE,  in the last 72 hours PT/INR  Recent Labs  06/04/13 1635  LABPROT 13.9  INR 1.09      Imaging Studies: Ct Abdomen Pelvis W Contrast  06/04/2013   CLINICAL DATA:  Abdominal pain. Hematochezia. Diarrhea since Saturday. Vomiting. CLL.  EXAM: CT ABDOMEN AND PELVIS  WITH CONTRAST  TECHNIQUE: Multidetector CT imaging of the abdomen and pelvis was performed using the standard protocol following bolus administration of intravenous contrast.  CONTRAST:  84mL OMNIPAQUE IOHEXOL 300 MG/ML SOLN, 126mL OMNIPAQUE IOHEXOL 300 MG/ML SOLN  COMPARISON:  08/13/2012  FINDINGS: Slight fibrosis or atelectasis in the lung bases. Prominent axillary lymph nodes are noted bilaterally.  Cholelithiasis. The liver, spleen, pancreas, adrenal glands, kidneys, and inferior vena cava are unremarkable. Calcification of the aorta without aneurysm. Mildly enlarged lymph nodes throughout the retroperitoneal region, extending into the iliac chains bilaterally. Prominent lymph nodes in the mesentery and celiac axis. These changes are similar to previous study and likely related to patient's history of CLL. The stomach, small bowel, and colon are not abnormally distended. No free air or free fluid in the abdomen. Abdominal wall musculature appears intact.  Pelvis: The colon is diffusely fluid-filled consistent with history of diarrhea and liquid stool. There is diverticulosis of the sigmoid colon with infiltration in the fat around the sigmoid colon suggesting changes of diverticulitis. Appearance is similar to the previous study. No abscess or evidence of perforation. The appendix is not identified. The prostate gland is diffusely enlarged, measuring 5.5 x 4.9 cm. Bladder wall is not thickened. Internal and external iliac chain lymph nodes and groin lymph nodes are prominent, consistent with history of CLL. Degenerative changes in the lumbar spine.  No destructive bone lesions appreciated.  IMPRESSION: Diverticulosis of the sigmoid colon with pericolonic infiltration consistent with early diverticulitis. No abscess. Lymphadenopathy is demonstrated throughout the visualized axillary, retroperitoneal, mesenteric, and pelvic areas bilaterally. This is consistent with history of CLL. Fluid in air-fluid levels  demonstrated throughout the colon consistent with liquid stool. No colonic distention.   Electronically Signed   By: Lucienne Capers M.D.   On: 06/04/2013 21:28  [2 weeks]   Assessment: Diverticulisit complicated by bleeding secondary to anticoagulation. Improved.  Hgb stable. Nonbloody stool today. Abdominal pain improved.    Plan: 1. Complete course of Cipro/Flagyl. 2. Consider resuming Effient in 2 weeks if needed. Stop Plavix at the time Effient resumed. Continue ASA. 3. Daily PPI.  4. Consider discharge tomorrow.     LOS: 3 days   [name]  06/07/2013, 12:37 PM

## 2013-06-08 MED ORDER — PANTOPRAZOLE SODIUM 40 MG PO TBEC
40.0000 mg | DELAYED_RELEASE_TABLET | Freq: Every day | ORAL | Status: DC
Start: 2013-06-08 — End: 2016-05-11

## 2013-06-08 MED ORDER — CLOPIDOGREL BISULFATE 75 MG PO TABS: 75.0000 mg | ORAL_TABLET | Freq: Every day | ORAL | Status: DC

## 2013-06-08 NOTE — Discharge Summary (Signed)
[name], [name]                ACCOUNT NO.:  [id number]  MEDICAL RECORD NO.:  [id number]  LOCATION:  X540                          FACILITY:  [facility]  PHYSICIAN:  Jennetta Flood G. Everette Rank, MD   DATE OF BIRTH:  1946-03-31  DATE OF ADMISSION:  06/04/2013 DATE OF DISCHARGE:  LH                              DISCHARGE SUMMARY   ADDENDUM:  The patient will be taking the following medications on discharge: 1. Protonix 40 mg daily. 2. Plavix 75 mg daily. 3. Cipro 500 mg b.i.d. 4. Flagyl 500 mg b.i.d. 5. He will continue D3 of 1000 units b.i.d. 6. Norco 7.5/325 mg every 4 hours as needed for pain. 7. Vitamin B12 2500 mcg sublingual weekly. 8. Aspirin 81 mg daily. 9. Over-the-counter medication for prostate health once daily. 10.Artificial Tears 1 drop, as needed for dry eyes. 08.QPYPPJK and methyl salicylate muscle rub as needed for back pain. 12.L-Arginine 500 mg every 30 days. 13.HydroDIURIL 25 mg daily. 14.Folic acid 932 mg daily. 15.Imodium 2 mg every 4 hours as needed for diarrhea. 16.Vytorin 10/40 one daily. 17.Nitroglycerin tablets 0.4 mg under the tongue every 5 minutes, as     needed for chest pain.     [name] G. Everette Rank, MD     AGM/MEDQ  D:  06/08/2013  T:  06/08/2013  Job:  [id number]

## 2013-06-08 NOTE — Progress Notes (Signed)
Pt discharged home today per Dr. Everette Rank. Pt's IV site D/C'd and WNL. Pt's VSS. Pt provided with home medication list, discharge instructions and prescriptions. Verbalized understanding. Pt left floor via WC in stable condition accompanied by NT.

## 2013-06-08 NOTE — Discharge Summary (Signed)
[name], [name]                ACCOUNT NO.:  [id number]  MEDICAL RECORD NO.:  [id number]  LOCATION:  O350                          FACILITY:  [facility]  PHYSICIAN:  Simranjit Thayer G. Everette Rank, MD   DATE OF BIRTH:  1945-11-01  DATE OF ADMISSION:  06/04/2013 DATE OF DISCHARGE:  01/03/2015LH                              DISCHARGE SUMMARY   This 68 year old white male admitted on June 04, 2013, discharged on June 08, 2013, 3 days hospitalization.  DIAGNOSES:  Hematochezia secondary to diverticulitis, coronary artery disease, prior non-ST elevation myocardial infarction.  CONDITION:  Stable.  ADDITIONAL DIAGNOSES:  Chronic lymphocytic leukemia, benign prostatic hypertrophy, depression.   This patient presented to the ED with chief problem being blood per rectum.  He had seen significant quantities of blood in stool and had emesis.  Denied any syncope.  Did develop some left lower quadrant pain. He was seen and evaluated by ED physician.  PHYSICAL EXAMINATION:  VITAL SIGNS:  On admission, blood pressure 115/65, pulse 110, temp 97. HEENT:  Negative. NECK:  Supple.  No JVD or thyroid abnormalities. LUNGS:  Clear to P and A. HEART:  Regular rhythm. ABDOMEN:  Tenderness over the left lower quadrant.  No organomegaly. SKIN:  Warm and dry. EXTREMITIES:  Free of edema.  LABORATORY DATA:  On admission, CBC; WBC 47,000 with hemoglobin 15.2, hematocrit 48.0, lymphocytes 84, neutrophils 14.  Chemistries on admission; sodium 140, potassium 4.3, chloride 103, CO2 of 23, glucose 108, BUN 18, creatinine 1.38, calcium 8.9, total protein 6.8, albumin 3.6, AST 26, ALT 11, alkaline phosphatase 55, bilirubin 1.2, troponin less than 0.30.  Subsequent labs; CBC on June 05, 2013, WBC 38,000, hemoglobin 12.3, hematocrit 38.5, TSH 0.911.  RADIOLOGY:  CT of abdomen was done.  CT showed diverticulosis of sigmoid colon with pericolonic infiltration consistent with early diverticulitis, no abscess.   Lymphadenopathy demonstrated throughout the axillary, retroperitoneal, mesenteric, and pelvic areas bilaterally consistent with a history of CLL.  No colonic distention.  HOSPITAL COURSE:  The patient on admission was placed in step-down unit __________.  He was placed on 0.9% sodium chloride infusion 50 mL an hour.  He was started on Cipro 400 mg IV q.12 hours and Flagyl 500 mg q.8 hours as well as Plavix 75 mg daily, Protonix 40 mg daily.  The patient was given IV morphine 1 mg __________ hours p.r.n. for pain, Zofran 4 mg every 6 hours p.r.n. for nausea.  Symptomatically, the patient improved, had less bleeding.  He was seen in consultation by Gastroenterology Service, who recommended discontinuing Effient for 2 weeks and starting PPI and completing course of Cipro and Flagyl.  GI Service did not feel the colonoscopy would be indicated at this time __________ to be improving.  Cardiologist saw patient as well and noted that he had diverticulitis in setting of antiplatelet therapy and noted he had a coronary NSTEMI in June 2014.  He had been managed medically and stable.  Effient had been discontinued, and he is to be continued on aspirin and Plavix.  Patient progressively improved, was moved to a med surg bed, and subsequently discharged.     [name], MD  AGM/MEDQ  D:  06/08/2013  T:  06/08/2013  Job:  [id number]

## 2013-06-10 ENCOUNTER — Encounter: Payer: Self-pay | Admitting: Internal Medicine

## 2013-06-10 NOTE — Telephone Encounter (Signed)
Pt is aware of OV on 1/30 at 9 with RMR and appt card was mailed

## 2013-06-10 NOTE — Progress Notes (Signed)
UR chart review completed.  

## 2013-07-05 ENCOUNTER — Encounter: Payer: Self-pay | Admitting: Internal Medicine

## 2013-07-05 ENCOUNTER — Ambulatory Visit (INDEPENDENT_AMBULATORY_CARE_PROVIDER_SITE_OTHER): Payer: Medicare Other | Admitting: Internal Medicine

## 2013-07-05 VITALS — BP 126/74 | HR 62 | Temp 98.4°F | Wt 173.8 lb

## 2013-07-05 DIAGNOSIS — K219 Gastro-esophageal reflux disease without esophagitis: Secondary | ICD-10-CM

## 2013-07-05 DIAGNOSIS — K5289 Other specified noninfective gastroenteritis and colitis: Secondary | ICD-10-CM

## 2013-07-05 NOTE — Patient Instructions (Signed)
CBC in 1 month  Begin benefiber 2 teaspoons twice daily  Continue Protonix daily  Office OV in 1 year

## 2013-07-05 NOTE — Progress Notes (Signed)
Primary Care Physician:  [name], MD Primary Gastroenterologist:  Dr. Gala Romney  Pre-Procedure History & Physical: HPI:  [name] is a 68 y.o. male here for followup of a recent bout of colitis and hematochezia. Hospitalized last month with same. Acute illness characterized by abdominal cramps, followed by nonbloody diarrhea followed by gross blood  - on aspirin and Effient. Had a 2 g drop in hemoglobin. Sigmoid inflammation on CT scan. Has known diverticulosis. Colonoscopy April of last year demonstrated a small adenoma removed and diverticulosis. His GI symptoms have resolved. He's now on Plavix and aspirin. Protonix 40 mg daily for GERD. Fingerstick hemoglobin and PCPs office recently reportedly demonstrated mild anemia for which iron was added to his regimen. Again, no further rectal bleeding or abdominal pain. He has known cholelithiasis. Reports a "twinge" of right upper quadrant abdominal pain lasting for nearly 30 minutes one or 2 times monthly. It's intermittent. Has no other associated symptoms.  GERD symptoms well controlled. No dysphagia or odynophagia.   Past Medical History  Diagnosis Date  . Diverticulitis   . Hypercholesteremia   . BPH (benign prostatic hyperplasia) 04/08/2011  . Gallstone   . Depression   . Headache(784.0)   . GERD (gastroesophageal reflux disease)   . H/O Non-ST elevation MI (NSTEMI) 11/23/12    RCA lesion - PCI with DES; moderate LAD disease  . CAD S/P percutaneous coronary angioplasty 11/23/12    2 overlapping mRCA lesions - Xience Xpedition DES 2.75 mm x 18 mm (3.0 mm)  . PTSD (post-traumatic stress disorder)     With Depression - since MI  . 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 Appendectomy  [id number]  . Colonoscopy  2008    [id number] diverticula, diminutive polyp in the cecum, s/p bx Normal rectum. Tubular adenoma  . Colonoscopy N/A 10/17/2012    Dr. Gala Romney: colonic  diverticulosis, tubular adenoma, surveillance due May 2019  . Cardiac catheterization  11/24/12    NSTEMI: Culprit = mid RCA 95% & 75%; LAD ~40-50%, Small RI ~60%; EF ~60%, mild inf-basal HK  . Coronary stent placement  11/24/12    Mid RCA -- Xience eXp - 2.75 mm x 18 mm DES (3.90mm) , Dr. Ellyn Hack    Prior to Admission medications   Medication Sig Start Date End Date Taking? Authorizing Provider  ARTIFICIAL TEAR SOLUTION OP Apply 1 drop to eye as needed (dry eyes).   Yes Historical Provider, MD  aspirin EC 81 MG tablet Take 81 mg by mouth daily.    Yes Historical Provider, MD  Cholecalciferol (D3-1000) 1000 UNITS capsule Take 1,000 Units by mouth 2 (two) times a week.     Yes Historical Provider, MD  ciprofloxacin (CIPRO) 500 MG tablet Take 500 mg by mouth 2 (two) times daily.   Yes Historical Provider, MD  clopidogrel (PLAVIX) 75 MG tablet Take 1 tablet (75 mg total) by mouth daily with breakfast. 06/08/13  Yes Angus Ailene Ravel, MD  Cyanocobalamin (VITAMIN B-12) 2500 MCG SUBL Place 1 tablet under the tongue once a week.    Yes Historical Provider, MD  docusate sodium (COLACE) 100 MG capsule Take 100 mg by mouth 2 (two) times daily.   Yes Historical Provider, MD  ezetimibe-simvastatin (VYTORIN) 10-40 MG per tablet Take 1 tablet by mouth at bedtime.   Yes Historical Provider, MD  folic acid (FOLVITE) A999333 MCG tablet Take 400 mcg by mouth daily.   Yes Historical Provider, MD  hydrochlorothiazide (HYDRODIURIL) 25 MG tablet Take 1 tablet (25 mg total) by mouth daily. 03/12/13  Yes Leonie Man, MD  HYDROcodone-acetaminophen (Onarga) 7.5-325 MG per tablet Take 1 tablet by mouth every 4 (four) hours as needed for pain.   Yes Historical Provider, MD  L-Arginine 500 MG CAPS Take 1 capsule by mouth every 30 (thirty) days.   Yes Historical Provider, MD  loperamide (IMODIUM A-D) 2 MG tablet Take 2 mg by mouth 4 (four) times daily as needed for diarrhea or loose stools.   Yes Historical Provider, MD   Menthol-Methyl Salicylate (MUSCLE RUB) 10-15 % CREA Apply 1 application topically as needed (for upper back pain).   Yes Historical Provider, MD  nitroGLYCERIN (NITROSTAT) 0.4 MG SL tablet Place 0.4 mg under the tongue every 5 (five) minutes x 3 doses as needed for chest pain. 11/24/12  Yes Tarri Fuller, PA-C  OVER THE COUNTER MEDICATION 1 tablet once a week. Herbal supplement for prostate health.   Yes Historical Provider, MD  pantoprazole (PROTONIX) 40 MG tablet Take 1 tablet (40 mg total) by mouth daily. 06/08/13  Yes [name], MD    Allergies as of 07/05/2013  . (No Known Allergies)    Family History  Problem Relation Age of Onset  . Diabetes Mother   . Cancer Father   . Colon cancer Neg Hx     History   Social History  . Marital Status: Married    Spouse Name: N/A    Number of Children: N/A  . Years of Education: N/A   Occupational History  . Not on file.   Social History Main Topics  . Smoking status: Former Smoker -- 0.25 packs/day for 2 years    Types: Cigarettes    Quit date: 06/07/1963  . Smokeless tobacco: Never Used  . Alcohol Use: No     Comment: Occasionally drinks red wine  . Drug Use: No  . Sexual Activity: Not on file   Other Topics Concern  . Not on file   Social History Narrative   Currently in Cardiac Rehabilitation   "former smoker". Does not drink EtOH   Married.    Review of Systems: See HPI, otherwise negative ROS  Physical Exam: BP 126/74  Pulse 62  Temp(Src) 98.4 F (36.9 C) (Oral)  Wt 173 lb 12.8 oz (78.835 kg) General:   Alert,  well-nourished, pleasant and cooperative in NAD Skin:  Intact without significant lesions or rashes. Eyes:  Sclera clear, no icterus.   Conjunctiva pink. Ears:  Normal auditory acuity. Nose:  No deformity, discharge,  or lesions. Mouth:  No deformity or lesions. Neck:  Supple; no masses or thyromegaly. No significant cervical adenopathy. Lungs:  Clear throughout to auscultation.   No wheezes,  crackles, or rhonchi. No acute distress. Heart:  Regular rate and rhythm; no murmurs, clicks, rubs,  or gallops. Abdomen: Non-distended, normal bowel sounds.  Soft and nontender without appreciable mass or hepatosplenomegaly.  Pulses:  Normal pulses noted. Extremities:  Without clubbing or edema.  Impression: Pleasant 68 year old gentleman with acute illness last month which has resolved. He presented with abdominal cramps, nonbloody diarrhea followed by gross blood per rectum which basically came and went in a few days.  CT picture consistent with focal left-sided colonic inflammation.  I suspect the patient more likely presented with ischemic colitis in the setting of antiplatelet therapy rather than diverticulitis and bleeding which would not be commonly associated together. At any rate, his symptoms have resolved. It is  good to know he is up-to-date on colonoscopy.  GERD symptoms well controlled on Protonix.  Fleeting intermittent right upper quadrant abdominal pain the setting of cholelithiasis. He may be developing early symptomatic gallbladder disease.  Recommendations:   Call us if right upper quadrant pain worsens or becomes more frequent.  CBC in 1 month  Begin benefiber 2 teaspoons twice daily  Continue Protonix daily  Office OV in 1 year  Future colonoscopy as recommended.

## 2013-07-25 ENCOUNTER — Other Ambulatory Visit: Payer: Self-pay

## 2013-07-25 DIAGNOSIS — K5289 Other specified noninfective gastroenteritis and colitis: Secondary | ICD-10-CM

## 2013-08-06 LAB — CBC WITH DIFFERENTIAL/PLATELET
Basophils Absolute: 0.1 10*3/uL (ref 0.0–0.1)
Basophils Relative: 0 % (ref 0–1)
Eosinophils Absolute: 0.4 10*3/uL (ref 0.0–0.7)
Eosinophils Relative: 1 % (ref 0–5)
HCT: 45.1 % (ref 39.0–52.0)
Hemoglobin: 14.5 g/dL (ref 13.0–17.0)
Lymphocytes Relative: 90 % — ABNORMAL HIGH (ref 12–46)
Lymphs Abs: 36.9 10*3/uL — ABNORMAL HIGH (ref 0.7–4.0)
MCH: 28.8 pg (ref 26.0–34.0)
MCHC: 32.2 g/dL (ref 30.0–36.0)
MCV: 89.7 fL (ref 78.0–100.0)
Monocytes Absolute: 0.8 10*3/uL (ref 0.1–1.0)
Monocytes Relative: 2 % — ABNORMAL LOW (ref 3–12)
Neutro Abs: 2.9 10*3/uL (ref 1.7–7.7)
Neutrophils Relative %: 7 % — ABNORMAL LOW (ref 43–77)
Platelets: 245 10*3/uL (ref 150–400)
RBC: 5.03 MIL/uL (ref 4.22–5.81)
RDW: 14.6 % (ref 11.5–15.5)
WBC: 41 10*3/uL — ABNORMAL HIGH (ref 4.0–10.5)

## 2013-08-19 ENCOUNTER — Ambulatory Visit (HOSPITAL_COMMUNITY): Admission: RE | Admit: 2013-08-19 | Payer: Medicare Other | Source: Ambulatory Visit

## 2013-08-19 ENCOUNTER — Encounter (HOSPITAL_COMMUNITY): Payer: Medicare Other | Attending: Hematology and Oncology

## 2013-08-19 ENCOUNTER — Ambulatory Visit (HOSPITAL_COMMUNITY): Payer: Medicare Other

## 2013-08-19 DIAGNOSIS — I2510 Atherosclerotic heart disease of native coronary artery without angina pectoris: Secondary | ICD-10-CM | POA: Insufficient documentation

## 2013-08-19 DIAGNOSIS — F329 Major depressive disorder, single episode, unspecified: Secondary | ICD-10-CM | POA: Insufficient documentation

## 2013-08-19 DIAGNOSIS — Z87891 Personal history of nicotine dependence: Secondary | ICD-10-CM | POA: Insufficient documentation

## 2013-08-19 DIAGNOSIS — N400 Enlarged prostate without lower urinary tract symptoms: Secondary | ICD-10-CM | POA: Insufficient documentation

## 2013-08-19 DIAGNOSIS — K219 Gastro-esophageal reflux disease without esophagitis: Secondary | ICD-10-CM | POA: Insufficient documentation

## 2013-08-19 DIAGNOSIS — E785 Hyperlipidemia, unspecified: Secondary | ICD-10-CM | POA: Insufficient documentation

## 2013-08-19 DIAGNOSIS — C9110 Chronic lymphocytic leukemia of B-cell type not having achieved remission: Secondary | ICD-10-CM | POA: Insufficient documentation

## 2013-08-19 DIAGNOSIS — Z9861 Coronary angioplasty status: Secondary | ICD-10-CM | POA: Insufficient documentation

## 2013-08-19 DIAGNOSIS — I252 Old myocardial infarction: Secondary | ICD-10-CM | POA: Insufficient documentation

## 2013-08-19 DIAGNOSIS — F3289 Other specified depressive episodes: Secondary | ICD-10-CM | POA: Insufficient documentation

## 2013-08-19 DIAGNOSIS — Z09 Encounter for follow-up examination after completed treatment for conditions other than malignant neoplasm: Secondary | ICD-10-CM | POA: Insufficient documentation

## 2013-08-19 DIAGNOSIS — F4310 Post-traumatic stress disorder, unspecified: Secondary | ICD-10-CM | POA: Insufficient documentation

## 2013-08-19 LAB — LACTATE DEHYDROGENASE: LDH: 172 U/L (ref 94–250)

## 2013-08-19 LAB — COMPREHENSIVE METABOLIC PANEL
ALT: 11 U/L (ref 0–53)
AST: 21 U/L (ref 0–37)
Albumin: 3.7 g/dL (ref 3.5–5.2)
Alkaline Phosphatase: 59 U/L (ref 39–117)
BUN: 15 mg/dL (ref 6–23)
CO2: 29 mEq/L (ref 19–32)
Calcium: 9.2 mg/dL (ref 8.4–10.5)
Chloride: 106 mEq/L (ref 96–112)
Creatinine, Ser: 1.28 mg/dL (ref 0.50–1.35)
GFR calc Af Amer: 64 mL/min — ABNORMAL LOW (ref >90)
GFR calc non Af Amer: 55 mL/min — ABNORMAL LOW (ref >90)
Glucose, Bld: 113 mg/dL — ABNORMAL HIGH (ref 70–99)
Potassium: 4.3 mEq/L (ref 3.7–5.3)
Sodium: 143 mEq/L (ref 137–147)
Total Bilirubin: 0.6 mg/dL (ref 0.3–1.2)
Total Protein: 7 g/dL (ref 6.0–8.3)

## 2013-08-19 LAB — CBC WITH DIFFERENTIAL/PLATELET
Basophils Absolute: 0.1 10*3/uL (ref 0.0–0.1)
Basophils Relative: 0 % (ref 0–1)
Eosinophils Absolute: 0.2 10*3/uL (ref 0.0–0.7)
Eosinophils Relative: 1 % (ref 0–5)
HCT: 48.5 % (ref 39.0–52.0)
Hemoglobin: 15.2 g/dL (ref 13.0–17.0)
Lymphocytes Relative: 91 % — ABNORMAL HIGH (ref 12–46)
Lymphs Abs: 38.7 10*3/uL — ABNORMAL HIGH (ref 0.7–4.0)
MCH: 29.3 pg (ref 26.0–34.0)
MCHC: 31.3 g/dL (ref 30.0–36.0)
MCV: 93.6 fL (ref 78.0–100.0)
Monocytes Absolute: 0.6 10*3/uL (ref 0.1–1.0)
Monocytes Relative: 2 % — ABNORMAL LOW (ref 3–12)
Neutro Abs: 2.8 10*3/uL (ref 1.7–7.7)
Neutrophils Relative %: 7 % — ABNORMAL LOW (ref 43–77)
Platelets: 199 10*3/uL (ref 150–400)
RBC: 5.18 MIL/uL (ref 4.22–5.81)
RDW: 14.3 % (ref 11.5–15.5)
WBC: 42.4 10*3/uL — ABNORMAL HIGH (ref 4.0–10.5)

## 2013-08-19 NOTE — Progress Notes (Signed)
Labs drawn today for cbc/diff,cmp,ldh,b87mic

## 2013-08-20 ENCOUNTER — Ambulatory Visit (HOSPITAL_COMMUNITY): Payer: Medicare Other

## 2013-08-20 ENCOUNTER — Encounter (HOSPITAL_COMMUNITY): Payer: Self-pay

## 2013-08-20 ENCOUNTER — Ambulatory Visit (HOSPITAL_COMMUNITY)
Admission: RE | Admit: 2013-08-20 | Discharge: 2013-08-20 | Disposition: A | Discharge status: Home / Self Care | Payer: Medicare Other | Source: Ambulatory Visit | Attending: Hematology and Oncology | Admitting: Hematology and Oncology

## 2013-08-20 DIAGNOSIS — R599 Enlarged lymph nodes, unspecified: Secondary | ICD-10-CM | POA: Insufficient documentation

## 2013-08-20 DIAGNOSIS — K8020 Calculus of gallbladder without cholecystitis without obstruction: Secondary | ICD-10-CM | POA: Insufficient documentation

## 2013-08-20 DIAGNOSIS — C9110 Chronic lymphocytic leukemia of B-cell type not having achieved remission: Secondary | ICD-10-CM | POA: Insufficient documentation

## 2013-08-20 MED ORDER — IOHEXOL 300 MG/ML  SOLN
100.0000 mL | Freq: Once | INTRAMUSCULAR | Status: AC | PRN
  Administered 2013-08-20: 100 mL via INTRAVENOUS

## 2013-08-21 ENCOUNTER — Encounter (HOSPITAL_COMMUNITY): Payer: Self-pay

## 2013-08-21 ENCOUNTER — Encounter (HOSPITAL_BASED_OUTPATIENT_CLINIC_OR_DEPARTMENT_OTHER): Payer: Medicare Other

## 2013-08-21 VITALS — BP 132/76 | HR 56 | Temp 97.1°F | Resp 18 | Wt 174.7 lb

## 2013-08-21 DIAGNOSIS — C9110 Chronic lymphocytic leukemia of B-cell type not having achieved remission: Secondary | ICD-10-CM

## 2013-08-21 LAB — BETA 2 MICROGLOBULIN, SERUM: Beta-2 Microglobulin: 3.86 mg/L — ABNORMAL HIGH (ref <=2.51)

## 2013-08-21 NOTE — Patient Instructions (Signed)
[facility] Discharge Instructions  RECOMMENDATIONS MADE BY THE CONSULTANT AND ANY TEST RESULTS WILL BE SENT TO YOUR REFERRING PHYSICIAN.  EXAM FINDINGS BY THE PHYSICIAN TODAY AND SIGNS OR SYMPTOMS TO REPORT TO CLINIC OR PRIMARY PHYSICIAN: Exam and findings as discussed by Dr.  Barnet Glasgow.  CT showed that lymph nodes are slightly larger but we don't need to do anything at present.  Report night sweats, fevers, unexplained weight loss, etc.  MEDICATIONS PRESCRIBED:  none  INSTRUCTIONS/FOLLOW-UP: Labs and follow-up in 4 months.  Thank you for choosing [facility] to provide your oncology and hematology care.  To afford each patient quality time with our providers, please arrive at least 15 minutes before your scheduled appointment time.  With your help, our goal is to use those 15 minutes to complete the necessary work-up to ensure our physicians have the information they need to help with your evaluation and healthcare recommendations.    Effective January 1st, 2014, we ask that you re-schedule your appointment with our physicians should you arrive 10 or more minutes late for your appointment.  We strive to give you quality time with our providers, and arriving late affects you and other patients whose appointments are after yours.    Again, thank you for choosing [facility].  Our hope is that these requests will decrease the amount of time that you wait before being seen by our physicians.       _____________________________________________________________  Should you have questions after your visit to [facility], please contact our office at [phone] between the hours of 8:30 a.m. and 5:00 p.m.  Voicemails left after 4:30 p.m. will not be returned until the following business day.  For prescription refill requests, have your pharmacy contact our office with your prescription refill request.

## 2013-08-21 NOTE — Addendum Note (Signed)
Addended by: Mellissa Kohut on: 08/21/2013 05:16 PM   Modules accepted: Orders

## 2013-08-21 NOTE — Progress Notes (Signed)
[name]  OFFICE PROGRESS NOTE  [name] Hampshire, MD [name] [id number]  DIAGNOSIS: CLL (chronic lymphocytic leukemia) - Plan: Beta 2 microglobuline, serum  Chief Complaint  Patient presents with  . Chronic lymphocytic leukemia with lymphadenopathy    CURRENT THERAPY: Watchful expectation and surveillance.  INTERVAL HISTORY: [name] 68 y.o. male returns for followup of chronic lymphocytic leukemia with lymphadenopathy having undergone repeat CT scan on 08/20/2013.  He had experienced shortness of breath prior to having stents placed in his coronary arteries. He feels well now with no bowel pain, easy satiety, fever, night sweats, but with occasional nasal drip with cough. He does experience spring allergies. He denies any diarrhea, melena, hematochezia, hematuria, urinary hesitancy, sore throat, skin rash, joint pain other than knees, headache, or seizures.  MEDICAL HISTORY: Past Medical History  Diagnosis Date  . Diverticulitis   . Hypercholesteremia   . BPH (benign prostatic hyperplasia) 04/08/2011  . Gallstone   . Depression   . Headache(784.0)   . GERD (gastroesophageal reflux disease)   . H/O Non-ST elevation MI (NSTEMI) 11/23/12    RCA lesion - PCI with DES; moderate LAD disease  . CAD S/P percutaneous coronary angioplasty 11/23/12    2 overlapping mRCA lesions - Xience Xpedition DES 2.75 mm x 18 mm (3.0 mm)  . PTSD (post-traumatic stress disorder)     With Depression - since MI  . CLL (chronic lymphocytic leukemia) 04/08/2011    INTERIM HISTORY: has CLL (chronic lymphocytic leukemia); BPH (benign prostatic hyperplasia); Diverticulitis of colon without hemorrhage- on antibiotics for recent flair up; Hx of adenomatous colonic polyps; NSTEMI (non-ST elevated myocardial infarction) - history of; Hyperglycemia; History of smoking; Dyslipidemia- now on Vytorin; CAD S/P percutaneous coronary angioplasty -- mid  RCA: Xience expedition 2.75 mm x 18 mm (3.1 mm); PTSD (post-traumatic stress disorder); Lower leg edema; Hematochezia; and CAD (coronary artery disease) on his problem list.    ALLERGIES:  has No Known Allergies.  MEDICATIONS: has a current medication list which includes the following prescription(s): artificial tear solution, cholecalciferol, vitamin d3, clopidogrel, vitamin b-12, docusate sodium, ezetimibe-simvastatin, folic acid, hydrochlorothiazide, hydrocodone-acetaminophen, l-arginine, loperamide, muscle rub, nitroglycerin, OVER THE COUNTER MEDICATION, pantoprazole, aspirin ec, ciprofloxacin, and metronidazole.  SURGICAL HISTORY:  Past Surgical History  Procedure Laterality Date  . Appendectomy  [id number]  . Colonoscopy  2008    [id number] diverticula, diminutive polyp in the cecum, s/p bx Normal rectum. Tubular adenoma  . Colonoscopy N/A 10/17/2012    Dr. Gala Romney: colonic diverticulosis, tubular adenoma, surveillance due May 2019  . Cardiac catheterization  11/24/12    NSTEMI: Culprit = mid RCA 95% & 75%; LAD ~40-50%, Small RI ~60%; EF ~60%, mild inf-basal HK  . Coronary stent placement  11/24/12    Mid RCA -- Xience eXp - 2.75 mm x 18 mm DES (3.5m) , Dr. HEllyn Hack   FAMILY HISTORY: family history includes Cancer in his father; Diabetes in his mother. There is no history of Colon cancer.  SOCIAL HISTORY:  reports that he quit smoking about 50 years ago. His smoking use included Cigarettes. He has a .5 pack-year smoking history. He has never used smokeless tobacco. He reports that he does not drink alcohol or use illicit drugs.  REVIEW OF SYSTEMS:  Other than that discussed above is noncontributory.  PHYSICAL EXAMINATION: ECOG PERFORMANCE STATUS: 1 - Symptomatic but completely ambulatory  Blood pressure 132/76, pulse 56, temperature 97.1 F (  36.2 C), temperature source Oral, resp. rate 18, weight 174 lb 11.2 oz (79.243 kg).  GENERAL:alert, no distress and comfortable SKIN: skin  color, texture, turgor are normal, no rashes or significant lesions EYES: PERLA; Conjunctiva are pink and non-injected, sclera clear OROPHARYNX:no exudate, no erythema on lips, buccal mucosa, or tongue. NECK: supple, thyroid normal size, non-tender, without nodularity. No masses CHEST: Normal AP diameter with no gynecomastia. LYMPH:  no palpable lymphadenopathy in the cervical, axillary or inguinal LUNGS: clear to auscultation and percussion with normal breathing effort HEART: regular rate & rhythm and no murmurs. ABDOMEN:abdomen soft, non-tender and normal bowel sounds MUSCULOSKELETAL:no cyanosis of digits and no clubbing. Range of motion normal.  NEURO: alert & oriented x 3 with fluent speech, no focal motor/sensory deficits   LABORATORY DATA:   Results for [name], Bryant (MRN [id number]) as of 08/21/2013 11:18  Ref. Range 06/05/2013 04:08 06/05/2013 09:48 06/05/2013 16:02 08/05/2013 11:31 08/19/2013 09:20  WBC Latest Range: 4.0-10.5 K/uL 42.5 (H) 41.5 (H) 38.0 (H) 41.0 (H) 42.4 (H)     Infusion on 08/19/2013  Component Date Value Ref Range Status  . WBC 08/19/2013 42.4* 4.0 - 10.5 K/uL Final  . RBC 08/19/2013 5.18  4.22 - 5.81 MIL/uL Final  . Hemoglobin 08/19/2013 15.2  13.0 - 17.0 g/dL Final  . HCT 08/19/2013 48.5  39.0 - 52.0 % Final  . MCV 08/19/2013 93.6  78.0 - 100.0 fL Final  . MCH 08/19/2013 29.3  26.0 - 34.0 pg Final  . MCHC 08/19/2013 31.3  30.0 - 36.0 g/dL Final  . RDW 08/19/2013 14.3  11.5 - 15.5 % Final  . Platelets 08/19/2013 199  150 - 400 K/uL Final  . Neutrophils Relative % 08/19/2013 7* 43 - 77 % Final  . Neutro Abs 08/19/2013 2.8  1.7 - 7.7 K/uL Final  . Lymphocytes Relative 08/19/2013 91* 12 - 46 % Final  . Lymphs Abs 08/19/2013 38.7* 0.7 - 4.0 K/uL Final  . Monocytes Relative 08/19/2013 2* 3 - 12 % Final  . Monocytes Absolute 08/19/2013 0.6  0.1 - 1.0 K/uL Final  . Eosinophils Relative 08/19/2013 1  0 - 5 % Final  . Eosinophils Absolute 08/19/2013 0.2  0.0 -  0.7 K/uL Final  . Basophils Relative 08/19/2013 0  0 - 1 % Final  . Basophils Absolute 08/19/2013 0.1  0.0 - 0.1 K/uL Final  . WBC Morphology 08/19/2013 ATYPICAL LYMPHOCYTES   Final   Comment: ABSOLUTE LYMPHOCYTOSIS                          SMUDGE CELLS  . Sodium 08/19/2013 143  137 - 147 mEq/L Final  . Potassium 08/19/2013 4.3  3.7 - 5.3 mEq/L Final  . Chloride 08/19/2013 106  96 - 112 mEq/L Final  . CO2 08/19/2013 29  19 - 32 mEq/L Final  . Glucose, Bld 08/19/2013 113* 70 - 99 mg/dL Final  . BUN 08/19/2013 15  6 - 23 mg/dL Final  . Creatinine, Ser 08/19/2013 1.28  0.50 - 1.35 mg/dL Final  . Calcium 08/19/2013 9.2  8.4 - 10.5 mg/dL Final  . Total Protein 08/19/2013 7.0  6.0 - 8.3 g/dL Final  . Albumin 08/19/2013 3.7  3.5 - 5.2 g/dL Final  . AST 08/19/2013 21  0 - 37 U/L Final  . ALT 08/19/2013 11  0 - 53 U/L Final  . Alkaline Phosphatase 08/19/2013 59  39 - 117 U/L Final  . Total Bilirubin 08/19/2013  0.6  0.3 - 1.2 mg/dL Final  . GFR calc non Af Amer 08/19/2013 55* >90 mL/min Final  . GFR calc Af Amer 08/19/2013 64* >90 mL/min Final   Comment: (NOTE)                          The eGFR has been calculated using the CKD EPI equation.                          This calculation has not been validated in all clinical situations.                          eGFR's persistently <90 mL/min signify possible Chronic Kidney                          Disease.  Marland Kitchen LDH 08/19/2013 172  94 - 250 U/L Final  Orders Only on 07/25/2013  Component Date Value Ref Range Status  . WBC 08/05/2013 41.0* 4.0 - 10.5 K/uL Final  . RBC 08/05/2013 5.03  4.22 - 5.81 MIL/uL Final  . Hemoglobin 08/05/2013 14.5  13.0 - 17.0 g/dL Final  . HCT 08/05/2013 45.1  39.0 - 52.0 % Final  . MCV 08/05/2013 89.7  78.0 - 100.0 fL Final  . MCH 08/05/2013 28.8  26.0 - 34.0 pg Final  . MCHC 08/05/2013 32.2  30.0 - 36.0 g/dL Final  . RDW 08/05/2013 14.6  11.5 - 15.5 % Final  . Platelets 08/05/2013 245  150 - 400 K/uL Final  .  Neutrophils Relative % 08/05/2013 7* 43 - 77 % Final  . Neutro Abs 08/05/2013 2.9  1.7 - 7.7 K/uL Final  . Lymphocytes Relative 08/05/2013 90* 12 - 46 % Final  . Lymphs Abs 08/05/2013 36.9* 0.7 - 4.0 K/uL Final  . Monocytes Relative 08/05/2013 2* 3 - 12 % Final  . Monocytes Absolute 08/05/2013 0.8  0.1 - 1.0 K/uL Final  . Eosinophils Relative 08/05/2013 1  0 - 5 % Final  . Eosinophils Absolute 08/05/2013 0.4  0.0 - 0.7 K/uL Final  . Basophils Relative 08/05/2013 0  0 - 1 % Final  . Basophils Absolute 08/05/2013 0.1  0.0 - 0.1 K/uL Final  . Smear Review 08/05/2013    Final   Comment: Atypical lymphs.                          RBCs and platelets are unremarkable.                               PATHOLOGY: No new pathology. Peripheral smear does show smudge cells.  Urinalysis No results found for this basename: colorurine,  appearanceur,  labspec,  phurine,  glucoseu,  hgbur,  bilirubinur,  ketonesur,  proteinur,  urobilinogen,  nitrite,  leukocytesur    RADIOGRAPHIC STUDIES: Ct Chest W Contrast  08/20/2013   CLINICAL DATA:  Chronic lymphocytic leukemia, lymphadenopathy  EXAM: CT CHEST AND ABDOMEN WITH CONTRAST  TECHNIQUE: Multidetector CT imaging of the chest and abdomen was performed following the standard protocol during bolus administration of intravenous contrast. Sagittal and coronal MPR images reconstructed from axial data set.  CONTRAST:  162m OMNIPAQUE IOHEXOL 300 MG/ML SOLN. Dilute oral contrast.  COMPARISON:  CT abdomen 06/04/2013  FINDINGS: CT CHEST FINDINGS  Vascular structures grossly patent on nondedicated exam.  Numerous minimally enlarged and normal sized lymph nodes throughout chest, including axillae, retropectoral, and mediastinal.  Largest nodes are 14 mm short axis and left axilla image 19 and 12 mm short axis in right axilla image shows 16 and 17.  Mild coronary arterial calcification when coronary stenting.  Dependent atelectasis in both lungs.  No acute infiltrate, pleural  effusion, pneumothorax or pulmonary mass/ nodule.  No acute osseous findings.  CT ABDOMEN FINDINGS  Dependent gallstone in gallbladder.  Liver, spleen, pancreas, kidneys, and adrenal glands normal appearance.  Enlarged retroperitoneal lymph nodes:  Enlarged left paraaortic node at bifurcation 18 mm short axis image 76 previously 13 mm.  Left periaortic node at mid aorta 15 mm short axis image 72 previously 12 mm. Left external iliac lymph node 17 mm short axis image 80, previously 13 mm.  Additional minimally prominent bilateral iliac and celiac axis lymph nodes seen.  Normal sized retrocrural node 9 mm short axis image 53.  Stomach and visualized bowel loops normal appearance.  Tiny umbilical hernia containing fat.  No mass, free air, free fluid, or acute osseous findings.  IMPRESSION:  THE: IMPRESSION:  THE Bilateral axillary and minimal mediastinal adenopathy.  Retroperitoneal adenopathy with increase in size of multiple retroperitoneal nodes since previous exam.  Cholelithiasis.   Electronically Signed   By: Lavonia Dana M.D.   On: 08/20/2013 15:12   Ct Abdomen W Contrast  08/20/2013   CLINICAL DATA:  Chronic lymphocytic leukemia, lymphadenopathy  EXAM: CT CHEST AND ABDOMEN WITH CONTRAST  TECHNIQUE: Multidetector CT imaging of the chest and abdomen was performed following the standard protocol during bolus administration of intravenous contrast. Sagittal and coronal MPR images reconstructed from axial data set.  CONTRAST:  165m OMNIPAQUE IOHEXOL 300 MG/ML SOLN. Dilute oral contrast.  COMPARISON:  CT abdomen 06/04/2013  FINDINGS: CT CHEST FINDINGS  Vascular structures grossly patent on nondedicated exam.  Numerous minimally enlarged and normal sized lymph nodes throughout chest, including axillae, retropectoral, and mediastinal.  Largest nodes are 14 mm short axis and left axilla image 19 and 12 mm short axis in right axilla image shows 16 and 17.  Mild coronary arterial calcification when coronary stenting.   Dependent atelectasis in both lungs.  No acute infiltrate, pleural effusion, pneumothorax or pulmonary mass/ nodule.  No acute osseous findings.  CT ABDOMEN FINDINGS  Dependent gallstone in gallbladder.  Liver, spleen, pancreas, kidneys, and adrenal glands normal appearance.  Enlarged retroperitoneal lymph nodes:  Enlarged left paraaortic node at bifurcation 18 mm short axis image 76 previously 13 mm.  Left periaortic node at mid aorta 15 mm short axis image 72 previously 12 mm. Left external iliac lymph node 17 mm short axis image 80, previously 13 mm.  Additional minimally prominent bilateral iliac and celiac axis lymph nodes seen.  Normal sized retrocrural node 9 mm short axis image 53.  Stomach and visualized bowel loops normal appearance.  Tiny umbilical hernia containing fat.  No mass, free air, free fluid, or acute osseous findings.  IMPRESSION:  THE: IMPRESSION:  THE Bilateral axillary and minimal mediastinal adenopathy.  Retroperitoneal adenopathy with increase in size of multiple retroperitoneal nodes since previous exam.  Cholelithiasis.   Electronically Signed   By: MLavonia DanaM.D.   On: 08/20/2013 15:12    ASSESSMENT:  #1. Chronic lymphocytic leukemia, stage I, ZAP negative, with abdominal lymphadenopathy, slightly worse by millimeters. #2. Coronary artery disease, status post STEMI, no evidence of heart failure or  dysrhythmia.   PLAN:  #1. Continue watchful expectation. #2. Patient was told to call should he develop increasing fatigue, dark urine, easy satiety, or fever. #3. Followup in 4 months with CBC, chem profile, reticulocyte count, LDH, and beta-2 microglobulin.   All questions were answered. The patient knows to call the clinic with any problems, questions or concerns. We can certainly see the patient much sooner if necessary.   I spent 25 minutes counseling the patient face to face. The total time spent in the appointment was 30 minutes.    Doroteo Bradford,  MD 08/21/2013 11:18 AM

## 2013-09-09 ENCOUNTER — Ambulatory Visit: Payer: Medicare Other | Admitting: Cardiology

## 2013-09-16 ENCOUNTER — Ambulatory Visit (INDEPENDENT_AMBULATORY_CARE_PROVIDER_SITE_OTHER): Payer: Medicare Other | Admitting: Cardiology

## 2013-09-16 ENCOUNTER — Encounter: Payer: Self-pay | Admitting: Cardiology

## 2013-09-16 VITALS — BP 150/72 | HR 52 | Ht 69.0 in | Wt 177.2 lb

## 2013-09-16 DIAGNOSIS — M940 Chondrocostal junction syndrome [Tietze]: Secondary | ICD-10-CM

## 2013-09-16 DIAGNOSIS — R609 Edema, unspecified: Secondary | ICD-10-CM

## 2013-09-16 DIAGNOSIS — R600 Localized edema: Secondary | ICD-10-CM

## 2013-09-16 DIAGNOSIS — E785 Hyperlipidemia, unspecified: Secondary | ICD-10-CM

## 2013-09-16 DIAGNOSIS — Z79899 Other long term (current) drug therapy: Secondary | ICD-10-CM

## 2013-09-16 DIAGNOSIS — Z9861 Coronary angioplasty status: Secondary | ICD-10-CM

## 2013-09-16 DIAGNOSIS — I2510 Atherosclerotic heart disease of native coronary artery without angina pectoris: Secondary | ICD-10-CM

## 2013-09-16 DIAGNOSIS — F4310 Post-traumatic stress disorder, unspecified: Secondary | ICD-10-CM

## 2013-09-16 DIAGNOSIS — I214 Non-ST elevation (NSTEMI) myocardial infarction: Secondary | ICD-10-CM

## 2013-09-16 NOTE — Patient Instructions (Addendum)
Your physician wants you to follow-up in: 6 months with Dr. Ellyn Hack.  You will receive a reminder letter in the mail two months in advance. If you don't receive a letter, please call our office to schedule the follow-up appointment.  Continue current medication  Labs CMP , LIPID  GO and see your primary before you see Dr Ellyn Hack in 6 months

## 2013-09-19 ENCOUNTER — Encounter: Payer: Self-pay | Admitting: Cardiology

## 2013-09-19 DIAGNOSIS — M940 Chondrocostal junction syndrome [Tietze]: Secondary | ICD-10-CM | POA: Insufficient documentation

## 2013-09-19 NOTE — Assessment & Plan Note (Signed)
Close to control during last visit. I will go ahead and check chemistry panel without and lipid panel for followup. On statin plus L. arginine

## 2013-09-19 NOTE — Assessment & Plan Note (Signed)
Very reproducible symptoms on exam the easily explain his current chest discomfort at that he knows that it is not similar to his angina as symptoms from last year. It is recommended using Tylenol  reluctant for him to use NSAIDs. It should be self limiting.

## 2013-09-19 NOTE — Assessment & Plan Note (Signed)
No recurrent symptoms. Did well with the recovery in in rehabilitation. Currently still active working out at [employer]. He is on minimal medications from a cardiac standpoint, but remained stable.

## 2013-09-19 NOTE — Assessment & Plan Note (Signed)
Relatively stable now with the HCTZ.

## 2013-09-19 NOTE — Assessment & Plan Note (Signed)
Stable on Plavix alone. This is secondary to a GI bleed concern. He is on Vytorin as well as Plavix. He is not on a beta blocker due to bradycardia. Currently not on an ACE inhibitor or ARB, however if his blood pressure continues to be is elevated as is today he would likely benefit from being on either ACE inhibitor or ARB in addition to the HCTZ which is helping his edema.

## 2013-09-19 NOTE — Progress Notes (Signed)
PATIENT: [name] MRN: [id number]  DOB: [birth year]   DOV:09/19/2013 PCP: [name], MD  Clinic Note: Chief Complaint  Patient presents with  . 6 month visit     chest pain last 2 days radiating left side off/on ,no sob, edema sometimes   HPI: [name] is a 68 y.o. male with a PMH below who presents today ffollowup for his CAD/non-STEMI WITH PCI. He had a non-STEMI on 11/23/2012 and had catheterization done the next day with PCI to the RCA.  He also is history of CLL along with hyperlipidemia.   He was last seen in October 2014.  He was initially troubled with suicidal ideation and depression, this is subsequently resolved. He did well with his cardiac rehabilitation and that all of his milestones. He is still working out at least one to 2 times a week and playing golf now the weather is improved. In December of 2014 [facility] and was found to have colitis. We switched his antiplatelet agents around to where he is simply on Plavix alone. He has not had any further bleeding since.  Interval History:   Today he comes in feeling quite normal with the exception of a strange sensation in the left side his chest that's been coming and going. It is not necessarily associated with any particular activity, in fact is probably worse with deep inspiration or coughing.  He has been having some cough due to allergies of late.  The remainder of Cardiovascular ROS: no chest pain or dyspnea on exertion positive for - chest pain, edema and Mild varicosities.  Mild edema goes away at night.  Chest pain as described above negative for - edema, irregular heartbeat, loss of consciousness, murmur, orthopnea, palpitations, paroxysmal nocturnal dyspnea, rapid heart rate or shortness of breath; lightheadedness, dizziness, CP/near-syncope, amaurosis fugax/TIA symptoms, melena, hematochezia or hematuria, epistaxis or claudication:  Past Medical History  Diagnosis  Date  . Diverticulitis   . Hypercholesteremia   . BPH (benign prostatic hyperplasia) 04/08/2011  . Gallstone   . Depression   . Headache(784.0)   . GERD (gastroesophageal reflux disease)   . H/O Non-ST elevation MI (NSTEMI) 11/23/12    RCA lesion - PCI with DES; moderate LAD disease  . CAD S/P percutaneous coronary angioplasty 11/23/12    2 overlapping mRCA lesions - Xience Xpedition DES 2.75 mm x 18 mm (3.0 mm)  . PTSD (post-traumatic stress disorder)     With Depression - since MI  . 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 Cardiac catheterization  11/24/12    NSTEMI: Culprit = mid RCA 95% & 75%; LAD ~40-50%, Small RI ~60%; EF ~60%, mild inf-basal HK  . Coronary stent placement  11/24/12    Mid RCA -- Xience eXp - 2.75 mm x 18 mm DES (3.79mm) , Dr. Ellyn Hack   No Known Allergies  MEDICATIONS reviewed in Epic. Not taking aspirin; currently on Plavix and not Effient. On PPI, and HCTZ  History   Social History Narrative   Currently in Cardiac Rehabilitation   "former smoker". Does not drink EtOH   Married.   ROS: A comprehensive Review of Systems - Negative except The psychological issues noted above.  PHYSICAL EXAM BP 150/72  Pulse 52  Ht 5\' 9"  (1.753 m)  Wt 177 lb 3.2 oz (80.377 kg)  BMI 26.16 kg/m2 General appearance: alert, cooperative, appears stated age,  no distress and relatively healthy-appearing. Well-nourished and well-groomed. Pleasant mood and affect.  HEENT: [place]/AT, EOMI, MMM, anicteric sclera Neck: no adenopathy, no carotid bruit, no JVD, supple, symmetrical, trachea midline and thyroid not enlarged, symmetric, no tenderness/mass/nodules  Lungs: CTA B., normal percussion bilaterally and nonlabored, good air movement.  Heart: RRR, S1, S2 normal, no murmur, click, rub or gallop and normal apical impulse -- palpation lung inferior costal margin reveals significant costochondral tenderness  that is reproducible Abdomen: soft, non-tender; bowel sounds normal; no masses, no organomegaly  Extremities: No C./C./E.,no ulcers, gangrene or trophic changes ;Pulses: 2+ and symmetric  Neurologic: Alert and oriented X 3, normal strength and tone. Normal symmetric reflexes. Normal coordination and gait   PTW:SFKCLEXNT today: Yes Rate: 52, Rhythm:  Sinus Bradycardia; inferior MI, age undetermined.  Normal axis and intervals. Otherwise normal ECG  Recent Labs:  no labs since last visit  ASSESSMENT / PLAN: NSTEMI (non-ST elevated myocardial infarction) - history of No recurrent symptoms. Did well with the recovery in in rehabilitation. Currently still active working out at [employer]. He is on minimal medications from a cardiac standpoint, but remained stable.  CAD S/P percutaneous coronary angioplasty -- mid RCA: Xience expedition 2.75 mm x 18 mm (3.1 mm) Stable on Plavix alone. This is secondary to a GI bleed concern. He is on Vytorin as well as Plavix. He is not on a beta blocker due to bradycardia. Currently not on an ACE inhibitor or ARB, however if his blood pressure continues to be is elevated as is today he would likely benefit from being on either ACE inhibitor or ARB in addition to the HCTZ which is helping his edema.  Lower leg edema Relatively stable now with the HCTZ.  Costochondritis Very reproducible symptoms on exam the easily explain his current chest discomfort at that he knows that it is not similar to his angina as symptoms from last year. It is recommended using Tylenol  reluctant for him to use NSAIDs. It should be self limiting.  PTSD (post-traumatic stress disorder) Essentially resolved  Dyslipidemia- now on Vytorin Close to control during last visit. I will go ahead and check chemistry panel without and lipid panel for followup. On statin plus L. arginine   Orders Placed This Encounter  Procedures  . Comprehensive metabolic panel  . Lipid panel  . EKG  12-Lead    Order Specific Question:  Where should this test be performed    Answer:  OTHER   No orders of the defined types were placed in this encounter.   Followup:  6 months   Leonie Man, M.D., M.S. Interventional Cardiologist  [place] Pager # [phone] 09/19/2013

## 2013-09-19 NOTE — Assessment & Plan Note (Signed)
Essentially resolved. 

## 2013-10-08 LAB — COMPREHENSIVE METABOLIC PANEL
ALT: 10 U/L (ref 0–53)
AST: 21 U/L (ref 0–37)
Albumin: 3.9 g/dL (ref 3.5–5.2)
Alkaline Phosphatase: 54 U/L (ref 39–117)
BUN: 12 mg/dL (ref 6–23)
CO2: 26 mEq/L (ref 19–32)
Calcium: 9.4 mg/dL (ref 8.4–10.5)
Chloride: 107 mEq/L (ref 96–112)
Creat: 1.22 mg/dL (ref 0.50–1.35)
Glucose, Bld: 100 mg/dL — ABNORMAL HIGH (ref 70–99)
Potassium: 4.4 mEq/L (ref 3.5–5.3)
Sodium: 140 mEq/L (ref 135–145)
Total Bilirubin: 0.6 mg/dL (ref 0.2–1.2)
Total Protein: 6.2 g/dL (ref 6.0–8.3)

## 2013-10-08 LAB — LIPID PANEL
Cholesterol: 140 mg/dL (ref 0–200)
HDL: 31 mg/dL — ABNORMAL LOW (ref >39)
LDL Cholesterol: 64 mg/dL (ref 0–99)
Total CHOL/HDL Ratio: 4.5 Ratio
Triglycerides: 226 mg/dL — ABNORMAL HIGH (ref <150)
VLDL: 45 mg/dL — ABNORMAL HIGH (ref 0–40)

## 2013-10-18 ENCOUNTER — Telehealth: Payer: Self-pay | Admitting: *Deleted

## 2013-10-18 NOTE — Telephone Encounter (Signed)
Message copied by Raiford Simmonds on Fri Oct 18, 2013 12:11 PM ------      Message from: Leonie Man      Created: Fri Oct 11, 2013  9:47 AM       Strange -- TC roughly the same.  HDL & LDL better, but TG up a bit -- this is the most "diet" responsive, i.e. Increases with starchy food intake.      LDL is at goal.  Stay on Vytorin -- watch diet intake to cut back on starchy food. ------

## 2013-10-18 NOTE — Telephone Encounter (Signed)
Spoke to wife. Result given . Verbalized understanding  

## 2013-12-11 ENCOUNTER — Encounter (HOSPITAL_COMMUNITY): Payer: Medicare Other | Attending: Hematology and Oncology

## 2013-12-11 DIAGNOSIS — E669 Obesity, unspecified: Secondary | ICD-10-CM | POA: Diagnosis not present

## 2013-12-11 DIAGNOSIS — Z79899 Other long term (current) drug therapy: Secondary | ICD-10-CM | POA: Insufficient documentation

## 2013-12-11 DIAGNOSIS — K8020 Calculus of gallbladder without cholecystitis without obstruction: Secondary | ICD-10-CM | POA: Insufficient documentation

## 2013-12-11 DIAGNOSIS — I252 Old myocardial infarction: Secondary | ICD-10-CM | POA: Insufficient documentation

## 2013-12-11 DIAGNOSIS — N400 Enlarged prostate without lower urinary tract symptoms: Secondary | ICD-10-CM | POA: Diagnosis not present

## 2013-12-11 DIAGNOSIS — K219 Gastro-esophageal reflux disease without esophagitis: Secondary | ICD-10-CM | POA: Diagnosis not present

## 2013-12-11 DIAGNOSIS — Z87891 Personal history of nicotine dependence: Secondary | ICD-10-CM | POA: Insufficient documentation

## 2013-12-11 DIAGNOSIS — K5730 Diverticulosis of large intestine without perforation or abscess without bleeding: Secondary | ICD-10-CM | POA: Diagnosis not present

## 2013-12-11 DIAGNOSIS — C9110 Chronic lymphocytic leukemia of B-cell type not having achieved remission: Secondary | ICD-10-CM | POA: Diagnosis present

## 2013-12-11 DIAGNOSIS — Z9861 Coronary angioplasty status: Secondary | ICD-10-CM | POA: Insufficient documentation

## 2013-12-11 DIAGNOSIS — E7800 Pure hypercholesterolemia, unspecified: Secondary | ICD-10-CM | POA: Diagnosis not present

## 2013-12-11 DIAGNOSIS — I2510 Atherosclerotic heart disease of native coronary artery without angina pectoris: Secondary | ICD-10-CM | POA: Insufficient documentation

## 2013-12-11 LAB — CBC WITH DIFFERENTIAL/PLATELET
Basophils Absolute: 0.1 10*3/uL (ref 0.0–0.1)
Basophils Relative: 0 % (ref 0–1)
Eosinophils Absolute: 0.2 10*3/uL (ref 0.0–0.7)
Eosinophils Relative: 1 % (ref 0–5)
HCT: 48.8 % (ref 39.0–52.0)
Hemoglobin: 15.6 g/dL (ref 13.0–17.0)
Lymphocytes Relative: 93 % — ABNORMAL HIGH (ref 12–46)
Lymphs Abs: 43.5 10*3/uL — ABNORMAL HIGH (ref 0.7–4.0)
MCH: 29.6 pg (ref 26.0–34.0)
MCHC: 32 g/dL (ref 30.0–36.0)
MCV: 92.6 fL (ref 78.0–100.0)
Monocytes Absolute: 0.1 10*3/uL (ref 0.1–1.0)
Monocytes Relative: 0 % — ABNORMAL LOW (ref 3–12)
Neutro Abs: 3 10*3/uL (ref 1.7–7.7)
Neutrophils Relative %: 6 % — ABNORMAL LOW (ref 43–77)
Platelets: 196 10*3/uL (ref 150–400)
RBC: 5.27 MIL/uL (ref 4.22–5.81)
RDW: 15.4 % (ref 11.5–15.5)
WBC: 46.8 10*3/uL — ABNORMAL HIGH (ref 4.0–10.5)

## 2013-12-11 LAB — COMPREHENSIVE METABOLIC PANEL
ALT: 12 U/L (ref 0–53)
AST: 25 U/L (ref 0–37)
Albumin: 3.8 g/dL (ref 3.5–5.2)
Alkaline Phosphatase: 57 U/L (ref 39–117)
Anion gap: 10 (ref 5–15)
BUN: 19 mg/dL (ref 6–23)
CO2: 25 mEq/L (ref 19–32)
Calcium: 9.2 mg/dL (ref 8.4–10.5)
Chloride: 108 mEq/L (ref 96–112)
Creatinine, Ser: 1.27 mg/dL (ref 0.50–1.35)
GFR calc Af Amer: 65 mL/min — ABNORMAL LOW (ref >90)
GFR calc non Af Amer: 56 mL/min — ABNORMAL LOW (ref >90)
Glucose, Bld: 133 mg/dL — ABNORMAL HIGH (ref 70–99)
Potassium: 4.5 mEq/L (ref 3.7–5.3)
Sodium: 143 mEq/L (ref 137–147)
Total Bilirubin: 0.7 mg/dL (ref 0.3–1.2)
Total Protein: 7 g/dL (ref 6.0–8.3)

## 2013-12-11 LAB — RETICULOCYTES
RBC.: 5.27 MIL/uL (ref 4.22–5.81)
Retic Count, Absolute: 52.7 10*3/uL (ref 19.0–186.0)
Retic Ct Pct: 1 % (ref 0.4–3.1)

## 2013-12-11 LAB — LACTATE DEHYDROGENASE: LDH: 174 U/L (ref 94–250)

## 2013-12-11 NOTE — Progress Notes (Signed)
LABS DRAWN FOR CBCD,CMP,B2M,LDH,RETIC,

## 2013-12-13 LAB — BETA 2 MICROGLOBULIN, SERUM: Beta-2 Microglobulin: 3.57 mg/L — ABNORMAL HIGH (ref <=2.51)

## 2013-12-18 ENCOUNTER — Encounter (HOSPITAL_COMMUNITY): Payer: Self-pay

## 2013-12-18 ENCOUNTER — Encounter (HOSPITAL_BASED_OUTPATIENT_CLINIC_OR_DEPARTMENT_OTHER): Payer: Medicare Other

## 2013-12-18 VITALS — BP 119/73 | HR 73 | Temp 97.4°F | Resp 18 | Wt 178.2 lb

## 2013-12-18 DIAGNOSIS — C9110 Chronic lymphocytic leukemia of B-cell type not having achieved remission: Secondary | ICD-10-CM

## 2013-12-18 DIAGNOSIS — K8020 Calculus of gallbladder without cholecystitis without obstruction: Secondary | ICD-10-CM

## 2013-12-18 NOTE — Progress Notes (Signed)
[facility]  OFFICE PROGRESS NOTE  [name], MD [place] [address]  DIAGNOSIS: CLL (chronic lymphocytic leukemia)  Cholelithiasis without cholecystitis  No chief complaint on file.   CURRENT THERAPY: Watchful expectation and surveillance.  INTERVAL HISTORY: [name] 68 y.o. male returns for followup of chronic lymphocytic leukemia with lymphadenopathy diagnosed in November of 2012 with last CT scan done on 08/20/2013 never having been treated. Most recent scan showed some increase in abdominal lymph node size. White blood cell count has ranged between 40 and 50,000.  He continues to do well excellent appetite and no episodes of easy satiety. He denies any fever, night sweats, abdominal pain, diarrhea, constipation, lower extremity swelling or redness, PND, orthopnea, palpitations, skin rash, headache, or seizures. He still has urinary hesitancy without incontinence.  MEDICAL HISTORY: Past Medical History  Diagnosis Date  . Diverticulitis   . Hypercholesteremia   . BPH (benign prostatic hyperplasia) 04/08/2011  . Gallstone   . Depression   . Headache(784.0)   . GERD (gastroesophageal reflux disease)   . H/O Non-ST elevation MI (NSTEMI) 11/23/12    RCA lesion - PCI with DES; moderate LAD disease  . CAD S/P percutaneous coronary angioplasty 11/23/12    2 overlapping mRCA lesions - Xience Xpedition DES 2.75 mm x 18 mm (3.0 mm)  . PTSD (post-traumatic stress disorder)     With Depression - since MI  . CLL (chronic lymphocytic leukemia) 04/08/2011    INTERIM HISTORY: has CLL (chronic lymphocytic leukemia); BPH (benign prostatic hyperplasia); Diverticulitis of colon without hemorrhage- on antibiotics for recent flair up; Hx of adenomatous colonic polyps; NSTEMI (non-ST elevated myocardial infarction) - history of; Hyperglycemia; History of smoking; Dyslipidemia- now on Vytorin; CAD S/P percutaneous coronary  angioplasty -- mid RCA: Xience expedition 2.75 mm x 18 mm (3.1 mm); PTSD (post-traumatic stress disorder); Lower leg edema; Hematochezia; CAD (coronary artery disease); Costochondritis; and Cholelithiasis without cholecystitis on his problem list.    ALLERGIES:  has No Known Allergies.  MEDICATIONS: has a current medication list which includes the following prescription(s): artificial tear solution, cholecalciferol, vitamin d3, clopidogrel, vitamin b-12, docusate sodium, ezetimibe-simvastatin, folic acid, hydrochlorothiazide, hydrocodone-acetaminophen, l-arginine, loperamide, muscle rub, nitroglycerin, OVER THE COUNTER MEDICATION, and pantoprazole.  SURGICAL HISTORY:  Past Surgical History  Procedure Laterality Date  . Appendectomy  [id number]  . Colonoscopy  2008    OTL:[id number] diverticula, diminutive polyp in the cecum, s/p bx Normal rectum. Tubular adenoma  . Colonoscopy N/A 10/17/2012    Dr. Gala Romney: colonic diverticulosis, tubular adenoma, surveillance due May 2019  . Cardiac catheterization  11/24/12    NSTEMI: Culprit = mid RCA 95% & 75%; LAD ~40-50%, Small RI ~60%; EF ~60%, mild inf-basal HK  . Coronary stent placement  11/24/12    Mid RCA -- Xience eXp - 2.75 mm x 18 mm DES (3.32m) , Dr. HEllyn Hack   FAMILY HISTORY: family history includes Cancer in his father; Diabetes in his mother. There is no history of Colon cancer.  SOCIAL HISTORY:  reports that he quit smoking about 50 years ago. His smoking use included Cigarettes. He has a .5 pack-year smoking history. He has never used smokeless tobacco. He reports that he does not drink alcohol or use illicit drugs.  REVIEW OF SYSTEMS:  Other than that discussed above is noncontributory.  PHYSICAL EXAMINATION: ECOG PERFORMANCE STATUS: 1 - Symptomatic but completely ambulatory  There were no vitals taken for this  visit.  GENERAL:alert, no distress and comfortable. Moderately obese. SKIN: skin color, texture, turgor are normal, no rashes or  significant lesions EYES: PERLA; Conjunctiva are pink and non-injected, sclera clear SINUSES: No redness or tenderness over maxillary or ethmoid sinuses OROPHARYNX:no exudate, no erythema on lips, buccal mucosa, or tongue. NECK: supple, thyroid normal size, non-tender, without nodularity. No masses CHEST: Normal AP diameter with no breast masses. LYMPH:  no palpable lymphadenopathy in the cervical, axillary or inguinal LUNGS: clear to auscultation and percussion with normal breathing effort HEART: regular rate & rhythm and no murmurs. ABDOMEN:abdomen soft, non-tender and normal bowel sounds. Soft with no organomegaly, ascites, or CVA tenderness. MUSCULOSKELETAL:no cyanosis of digits and no clubbing. Range of motion normal.  NEURO: alert & oriented x 3 with fluent speech, no focal motor/sensory deficits   LABORATORY DATA: Lab on 12/11/2013  Component Date Value Ref Range Status  . WBC 12/11/2013 46.8* 4.0 - 10.5 K/uL Final  . RBC 12/11/2013 5.27  4.22 - 5.81 MIL/uL Final  . Hemoglobin 12/11/2013 15.6  13.0 - 17.0 g/dL Final  . HCT 12/11/2013 48.8  39.0 - 52.0 % Final  . MCV 12/11/2013 92.6  78.0 - 100.0 fL Final  . MCH 12/11/2013 29.6  26.0 - 34.0 pg Final  . MCHC 12/11/2013 32.0  30.0 - 36.0 g/dL Final  . RDW 12/11/2013 15.4  11.5 - 15.5 % Final  . Platelets 12/11/2013 196  150 - 400 K/uL Final  . Neutrophils Relative % 12/11/2013 6* 43 - 77 % Final  . Neutro Abs 12/11/2013 3.0  1.7 - 7.7 K/uL Final  . Lymphocytes Relative 12/11/2013 93* 12 - 46 % Final  . Lymphs Abs 12/11/2013 43.5* 0.7 - 4.0 K/uL Final  . Monocytes Relative 12/11/2013 0* 3 - 12 % Final  . Monocytes Absolute 12/11/2013 0.1  0.1 - 1.0 K/uL Final  . Eosinophils Relative 12/11/2013 1  0 - 5 % Final  . Eosinophils Absolute 12/11/2013 0.2  0.0 - 0.7 K/uL Final  . Basophils Relative 12/11/2013 0  0 - 1 % Final  . Basophils Absolute 12/11/2013 0.1  0.0 - 0.1 K/uL Final  . WBC Morphology 12/11/2013 SMUDGE CELLS   Final     Comment: ABSOLUTE LYMPHOCYTOSIS                          ATYPICAL LYMPHOCYTES  . Sodium 12/11/2013 143  137 - 147 mEq/L Final  . Potassium 12/11/2013 4.5  3.7 - 5.3 mEq/L Final  . Chloride 12/11/2013 108  96 - 112 mEq/L Final  . CO2 12/11/2013 25  19 - 32 mEq/L Final  . Glucose, Bld 12/11/2013 133* 70 - 99 mg/dL Final  . BUN 12/11/2013 19  6 - 23 mg/dL Final  . Creatinine, Ser 12/11/2013 1.27  0.50 - 1.35 mg/dL Final  . Calcium 12/11/2013 9.2  8.4 - 10.5 mg/dL Final  . Total Protein 12/11/2013 7.0  6.0 - 8.3 g/dL Final  . Albumin 12/11/2013 3.8  3.5 - 5.2 g/dL Final  . AST 12/11/2013 25  0 - 37 U/L Final  . ALT 12/11/2013 12  0 - 53 U/L Final  . Alkaline Phosphatase 12/11/2013 57  39 - 117 U/L Final  . Total Bilirubin 12/11/2013 0.7  0.3 - 1.2 mg/dL Final  . GFR calc non Af Amer 12/11/2013 56* >90 mL/min Final  . GFR calc Af Amer 12/11/2013 65* >90 mL/min Final   Comment: (NOTE)  The eGFR has been calculated using the CKD EPI equation.                          This calculation has not been validated in all clinical situations.                          eGFR's persistently <90 mL/min signify possible Chronic Kidney                          Disease.  . Anion gap 12/11/2013 10  5 - 15 Final  . Beta-2 Microglobulin 12/11/2013 3.57* <=2.51 mg/L Final   Performed at [facility]  . LDH 12/11/2013 174  94 - 250 U/L Final  . Retic Ct Pct 12/11/2013 1.0  0.4 - 3.1 % Final  . RBC. 12/11/2013 5.27  4.22 - 5.81 MIL/uL Final  . Retic Count, Manual 12/11/2013 52.7  19.0 - 186.0 K/uL Final    PATHOLOGY: No new pathology.   Urinalysis No results found for this basename: colorurine, appearanceur, labspec, phurine, glucoseu, hgbur, bilirubinur, ketonesur, proteinur, urobilinogen, nitrite, leukocytesur    RADIOGRAPHIC STUDIES: No results found.  ASSESSMENT:  #1. Chronic lymphocytic leukemia, stage I, ZAP negative, with abdominal lymphadenopathy, slightly worse  by millimeters.  #2. Coronary artery disease, status post STEMI, no evidence of heart failure or dysrhythmia #3. Benign prostatic hypertrophy, minimally symptomatic. #4. Cholelithiasis, asymptomatic. #5. Coronary artery disease, no evidence of dysrhythmia or heart failure. #6. Diverticulosis, quiescient.   PLAN:  #1. Continue watchful expectation.  #2. Patient was told to call should he develop increasing fatigue, dark urine, easy satiety, or fever.  #3. Followup in 3 months with CBC, chem profile, reticulocyte count, LDH, and beta-2 microglobulin along with repeat CT scan studies.    All questions were answered. The patient knows to call the clinic with any problems, questions or concerns. We can certainly see the patient much sooner if necessary.   I spent 25 minutes counseling the patient face to face. The total time spent in the appointment was 30 minutes.    Doroteo Bradford, MD 12/18/2013 11:10 AM  DISCLAIMER:  This note was dictated with voice recognition software.  Similar sounding words can inadvertently be transcribed inaccurately and may not be corrected upon review.

## 2013-12-18 NOTE — Patient Instructions (Signed)
[facility] Discharge Instructions  RECOMMENDATIONS MADE BY THE CONSULTANT AND ANY TEST RESULTS WILL BE SENT TO YOUR REFERRING PHYSICIAN. We will see you in 3 months for repeat labs and a doctor's appointment. Please call for any questions or concerns.   Thank you for choosing [facility] to provide your oncology and hematology care.  To afford each patient quality time with our providers, please arrive at least 15 minutes before your scheduled appointment time.  With your help, our goal is to use those 15 minutes to complete the necessary work-up to ensure our physicians have the information they need to help with your evaluation and healthcare recommendations.    Effective January 1st, 2014, we ask that you re-schedule your appointment with our physicians should you arrive 10 or more minutes late for your appointment.  We strive to give you quality time with our providers, and arriving late affects you and other patients whose appointments are after yours.    Again, thank you for choosing [facility].  Our hope is that these requests will decrease the amount of time that you wait before being seen by our physicians.       _____________________________________________________________  Should you have questions after your visit to [facility], please contact our office at [phone] between the hours of 8:30 a.m. and 4:30 p.m.  Voicemails left after 4:30 p.m. will not be returned until the following business day.  For prescription refill requests, have your pharmacy contact our office with your prescription refill request.    _______________________________________________________________  We hope that we have given you very good care.  You may receive a patient satisfaction survey in the mail, please complete it and return it as soon as possible.  We value your  feedback!  _______________________________________________________________  Have you asked about our STAR program?  STAR stands for Survivorship Training and Rehabilitation, and this is a nationally recognized cancer care program that focuses on survivorship and rehabilitation.  Cancer and cancer treatments may cause problems, such as, pain, making you feel tired and keeping you from doing the things that you need or want to do. Cancer rehabilitation can help. Our goal is to reduce these troubling effects and help you have the best quality of life possible.  You may receive a survey from a nurse that asks questions about your current state of health.  Based on the survey results, all eligible patients will be referred to the [facility] program for an evaluation so we can better serve you!  A frequently asked questions sheet is available upon request.

## 2014-03-12 ENCOUNTER — Ambulatory Visit (HOSPITAL_COMMUNITY)
Admission: RE | Admit: 2014-03-12 | Discharge: 2014-03-12 | Disposition: A | Discharge status: Home / Self Care | Payer: Medicare Other | Source: Ambulatory Visit | Attending: Hematology and Oncology | Admitting: Hematology and Oncology

## 2014-03-12 DIAGNOSIS — I2510 Atherosclerotic heart disease of native coronary artery without angina pectoris: Secondary | ICD-10-CM | POA: Diagnosis not present

## 2014-03-12 DIAGNOSIS — K8020 Calculus of gallbladder without cholecystitis without obstruction: Secondary | ICD-10-CM | POA: Insufficient documentation

## 2014-03-12 DIAGNOSIS — C9110 Chronic lymphocytic leukemia of B-cell type not having achieved remission: Secondary | ICD-10-CM

## 2014-03-12 LAB — POCT I-STAT CREATININE: Creatinine, Ser: 1.3 mg/dL (ref 0.50–1.35)

## 2014-03-12 MED ORDER — IOHEXOL 300 MG/ML  SOLN
100.0000 mL | Freq: Once | INTRAMUSCULAR | Status: AC | PRN
  Administered 2014-03-12: 100 mL via INTRAVENOUS

## 2014-03-21 ENCOUNTER — Encounter (HOSPITAL_COMMUNITY): Payer: Medicare Other

## 2014-03-21 ENCOUNTER — Encounter (HOSPITAL_COMMUNITY): Payer: Medicare Other | Attending: Hematology and Oncology

## 2014-03-21 ENCOUNTER — Encounter (HOSPITAL_COMMUNITY): Payer: Self-pay

## 2014-03-21 VITALS — BP 152/89 | HR 64 | Temp 97.4°F | Resp 18 | Wt 175.8 lb

## 2014-03-21 DIAGNOSIS — N400 Enlarged prostate without lower urinary tract symptoms: Secondary | ICD-10-CM | POA: Diagnosis not present

## 2014-03-21 DIAGNOSIS — K5732 Diverticulitis of large intestine without perforation or abscess without bleeding: Secondary | ICD-10-CM | POA: Insufficient documentation

## 2014-03-21 DIAGNOSIS — K8020 Calculus of gallbladder without cholecystitis without obstruction: Secondary | ICD-10-CM | POA: Insufficient documentation

## 2014-03-21 DIAGNOSIS — I2510 Atherosclerotic heart disease of native coronary artery without angina pectoris: Secondary | ICD-10-CM | POA: Diagnosis not present

## 2014-03-21 DIAGNOSIS — C9110 Chronic lymphocytic leukemia of B-cell type not having achieved remission: Secondary | ICD-10-CM | POA: Diagnosis present

## 2014-03-21 DIAGNOSIS — Z9861 Coronary angioplasty status: Secondary | ICD-10-CM | POA: Diagnosis not present

## 2014-03-21 DIAGNOSIS — Z23 Encounter for immunization: Secondary | ICD-10-CM

## 2014-03-21 LAB — CBC WITH DIFFERENTIAL/PLATELET
Basophils Absolute: 0.1 10*3/uL (ref 0.0–0.1)
Basophils Relative: 0 % (ref 0–1)
Eosinophils Absolute: 0.2 10*3/uL (ref 0.0–0.7)
Eosinophils Relative: 1 % (ref 0–5)
HCT: 47.6 % (ref 39.0–52.0)
Hemoglobin: 15.3 g/dL (ref 13.0–17.0)
Lymphocytes Relative: 91 % — ABNORMAL HIGH (ref 12–46)
Lymphs Abs: 43 10*3/uL — ABNORMAL HIGH (ref 0.7–4.0)
MCH: 30.1 pg (ref 26.0–34.0)
MCHC: 32.1 g/dL (ref 30.0–36.0)
MCV: 93.5 fL (ref 78.0–100.0)
Monocytes Absolute: 0.6 10*3/uL (ref 0.1–1.0)
Monocytes Relative: 1 % — ABNORMAL LOW (ref 3–12)
Neutro Abs: 3.3 10*3/uL (ref 1.7–7.7)
Neutrophils Relative %: 7 % — ABNORMAL LOW (ref 43–77)
Platelets: 235 10*3/uL (ref 150–400)
RBC: 5.09 MIL/uL (ref 4.22–5.81)
RDW: 14.8 % (ref 11.5–15.5)
WBC: 47.2 10*3/uL — ABNORMAL HIGH (ref 4.0–10.5)

## 2014-03-21 LAB — COMPREHENSIVE METABOLIC PANEL
ALT: 11 U/L (ref 0–53)
AST: 22 U/L (ref 0–37)
Albumin: 3.7 g/dL (ref 3.5–5.2)
Alkaline Phosphatase: 55 U/L (ref 39–117)
Anion gap: 11 (ref 5–15)
BUN: 18 mg/dL (ref 6–23)
CO2: 27 mEq/L (ref 19–32)
Calcium: 9.7 mg/dL (ref 8.4–10.5)
Chloride: 105 mEq/L (ref 96–112)
Creatinine, Ser: 1.42 mg/dL — ABNORMAL HIGH (ref 0.50–1.35)
GFR calc Af Amer: 57 mL/min — ABNORMAL LOW (ref >90)
GFR calc non Af Amer: 49 mL/min — ABNORMAL LOW (ref >90)
Glucose, Bld: 99 mg/dL (ref 70–99)
Potassium: 4.2 mEq/L (ref 3.7–5.3)
Sodium: 143 mEq/L (ref 137–147)
Total Bilirubin: 0.9 mg/dL (ref 0.3–1.2)
Total Protein: 7 g/dL (ref 6.0–8.3)

## 2014-03-21 LAB — RETICULOCYTES
RBC.: 5.09 MIL/uL (ref 4.22–5.81)
Retic Count, Absolute: 50.9 10*3/uL (ref 19.0–186.0)
Retic Ct Pct: 1 % (ref 0.4–3.1)

## 2014-03-21 LAB — LACTATE DEHYDROGENASE: LDH: 157 U/L (ref 94–250)

## 2014-03-21 MED ORDER — INFLUENZA VAC SPLIT QUAD 0.5 ML IM SUSY
PREFILLED_SYRINGE | INTRAMUSCULAR | Status: AC
  Filled 2014-03-21: qty 0.5

## 2014-03-21 MED ORDER — INFLUENZA VAC SPLIT QUAD 0.5 ML IM SUSY
0.5000 mL | PREFILLED_SYRINGE | Freq: Once | INTRAMUSCULAR | Status: AC
  Administered 2014-03-21: 0.5 mL via INTRAMUSCULAR

## 2014-03-21 NOTE — Patient Instructions (Signed)
[facility] Discharge Instructions  RECOMMENDATIONS MADE BY THE CONSULTANT AND ANY TEST RESULTS WILL BE SENT TO YOUR REFERRING PHYSICIAN.  EXAM FINDINGS BY THE PHYSICIAN TODAY AND SIGNS OR SYMPTOMS TO REPORT TO CLINIC OR PRIMARY PHYSICIAN: YOu saw Dr Barnet Glasgow today  MEDICATIONS PRESCRIBED:  Nothing new  INSTRUCTIONS GIVEN AND DISCUSSED: Received a flu vaccine today  SPECIAL INSTRUCTIONS/FOLLOW-UP: Return in 3 months with lab work  Thank you for choosing [facility] to provide your oncology and hematology care.  To afford each patient quality time with our providers, please arrive at least 15 minutes before your scheduled appointment time.  With your help, our goal is to use those 15 minutes to complete the necessary work-up to ensure our physicians have the information they need to help with your evaluation and healthcare recommendations.    Effective January 1st, 2014, we ask that you re-schedule your appointment with our physicians should you arrive 10 or more minutes late for your appointment.  We strive to give you quality time with our providers, and arriving late affects you and other patients whose appointments are after yours.    Again, thank you for choosing [facility].  Our hope is that these requests will decrease the amount of time that you wait before being seen by our physicians.       _____________________________________________________________  Should you have questions after your visit to [facility], please contact our office at [phone] between the hours of 8:30 a.m. and 5:00 p.m.  Voicemails left after 4:30 p.m. will not be returned until the following business day.  For prescription refill requests, have your pharmacy contact our office with your prescription refill request.

## 2014-03-21 NOTE — Progress Notes (Signed)
[facility]  OFFICE PROGRESS NOTE  [name], MD [place] [address]  DIAGNOSIS: CLL (chronic lymphocytic leukemia) - Plan: CBC with Differential, Lactate dehydrogenase, Reticulocytes, Comprehensive metabolic panel, Beta 2 microglobulin, serum  Cholelithiasis without cholecystitis  Chief Complaint  Patient presents with  . Chronic lymphocytic leukemia with lymphadenopathy    CURRENT THERAPY: Watchful expectation and surveillance  INTERVAL HISTORY: [name] 68 y.o. male returns for followup of chronic lymphocytic leukemia with lymphadenopathy diagnosed in November of 2012 with last CT scan done on 03/12/2014 never having been treated. Most recent scan showed some stable thoracic, pelvic, and  abdominal lymph node size. White blood cell count has ranged between 40 and 50,000.  He continues to do well with no specific complaints. He did have one episode of bronchitis which to 2 weeks to resolve on antibiotics treated by his family physician. Currently denies any fever, sore throat, cough, wheezing, PND, orthopnea, palpitations, abdominal pain, back pain, lower extremity swelling or redness, fever, night sweats, or easy satiety.    MEDICAL HISTORY: Past Medical History  Diagnosis Date  . Diverticulitis   . Hypercholesteremia   . BPH (benign prostatic hyperplasia) 04/08/2011  . Gallstone   . Depression   . Headache(784.0)   . GERD (gastroesophageal reflux disease)   . H/O Non-ST elevation MI (NSTEMI) 11/23/12    RCA lesion - PCI with DES; moderate LAD disease  . CAD S/P percutaneous coronary angioplasty 11/23/12    2 overlapping mRCA lesions - Xience Xpedition DES 2.75 mm x 18 mm (3.0 mm)  . PTSD (post-traumatic stress disorder)     With Depression - since MI  . CLL (chronic lymphocytic leukemia) 04/08/2011    INTERIM HISTORY: has CLL (chronic lymphocytic leukemia); BPH (benign prostatic hyperplasia);  Diverticulitis of colon without hemorrhage- on antibiotics for recent flair up; Hx of adenomatous colonic polyps; NSTEMI (non-ST elevated myocardial infarction) - history of; Hyperglycemia; History of smoking; Dyslipidemia- now on Vytorin; CAD S/P percutaneous coronary angioplasty -- mid RCA: Xience expedition 2.75 mm x 18 mm (3.1 mm); PTSD (post-traumatic stress disorder); Lower leg edema; Hematochezia; CAD (coronary artery disease); Costochondritis; and Cholelithiasis without cholecystitis on his problem list.    ALLERGIES:  has No Known Allergies.  MEDICATIONS: has a current medication list which includes the following prescription(s): artificial tear solution, aspirin, cholecalciferol, vitamin d3, clopidogrel, vitamin b-12, docusate sodium, ezetimibe-simvastatin, ferrous sulfate, folic acid, hydrochlorothiazide, l-arginine, loperamide, muscle rub, nitroglycerin, OVER THE COUNTER MEDICATION, pantoprazole, tramadol, and vitamin c.  SURGICAL HISTORY:  Past Surgical History  Procedure Laterality Date  . Appendectomy  [id number]  . Colonoscopy  2008    XBJ:[id number]-[id number] diverticula, diminutive polyp in the cecum, s/p bx Normal rectum. Tubular adenoma  . Colonoscopy N/A 10/17/2012    Dr. Gala Romney: colonic diverticulosis, tubular adenoma, surveillance due May 2019  . Cardiac catheterization  11/24/12    NSTEMI: Culprit = mid RCA 95% & 75%; LAD ~40-50%, Small RI ~60%; EF ~60%, mild inf-basal HK  . Coronary stent placement  11/24/12    Mid RCA -- Xience eXp - 2.75 mm x 18 mm DES (3.56mm) , Dr. Ellyn Hack    FAMILY HISTORY: family history includes Cancer in his father; Diabetes in his mother. There is no history of Colon cancer.  SOCIAL HISTORY:  reports that he quit smoking about 50 years ago. His smoking use included Cigarettes. He has a .5 pack-year smoking history. He has never used  smokeless tobacco. He reports that he does not drink alcohol or use illicit drugs.  REVIEW OF SYSTEMS:  Other than that discussed  above is noncontributory.  PHYSICAL EXAMINATION: ECOG PERFORMANCE STATUS: 1 - Symptomatic but completely ambulatory  Blood pressure 152/89, pulse 64, temperature 97.4 F (36.3 C), temperature source Oral, resp. rate 18, weight 175 lb 12.8 oz (79.742 kg), SpO2 98.00%.  GENERAL:alert, no distress and comfortable SKIN: skin color, texture, turgor are normal, no rashes or significant lesions EYES: PERLA; Conjunctiva are pink and non-injected, sclera clear SINUSES: No redness or tenderness over maxillary or ethmoid sinuses OROPHARYNX:no exudate, no erythema on lips, buccal mucosa, or tongue. NECK: supple, thyroid normal size, non-tender, without nodularity. No masses CHEST: Normal AP diameter with no breast masses. LYMPH:  no palpable lymphadenopathy in the cervical, axillary or inguinal LUNGS: clear to auscultation and percussion with normal breathing effort HEART: regular rate & rhythm and no murmurs. ABDOMEN:abdomen soft, non-tender and normal bowel sounds. Liver and spleen not palpable. MUSCULOSKELETAL:no cyanosis of digits and no clubbing. Range of motion normal.  NEURO: alert & oriented x 3 with fluent speech, no focal motor/sensory deficits   LABORATORY DATA: Lab on 03/21/2014  Component Date Value Ref Range Status  . WBC 03/21/2014 47.2* 4.0 - 10.5 K/uL Final  . RBC 03/21/2014 5.09  4.22 - 5.81 MIL/uL Final  . Hemoglobin 03/21/2014 15.3  13.0 - 17.0 g/dL Final  . HCT 03/21/2014 47.6  39.0 - 52.0 % Final  . MCV 03/21/2014 93.5  78.0 - 100.0 fL Final  . MCH 03/21/2014 30.1  26.0 - 34.0 pg Final  . MCHC 03/21/2014 32.1  30.0 - 36.0 g/dL Final  . RDW 03/21/2014 14.8  11.5 - 15.5 % Final  . Platelets 03/21/2014 235  150 - 400 K/uL Final  . Neutrophils Relative % 03/21/2014 7* 43 - 77 % Final  . Neutro Abs 03/21/2014 3.3  1.7 - 7.7 K/uL Final  . Lymphocytes Relative 03/21/2014 91* 12 - 46 % Final  . Lymphs Abs 03/21/2014 43.0* 0.7 - 4.0 K/uL Final  . Monocytes Relative 03/21/2014  1* 3 - 12 % Final  . Monocytes Absolute 03/21/2014 0.6  0.1 - 1.0 K/uL Final  . Eosinophils Relative 03/21/2014 1  0 - 5 % Final  . Eosinophils Absolute 03/21/2014 0.2  0.0 - 0.7 K/uL Final  . Basophils Relative 03/21/2014 0  0 - 1 % Final  . Basophils Absolute 03/21/2014 0.1  0.0 - 0.1 K/uL Final  . WBC Morphology 03/21/2014 ABSOLUTE LYMPHOCYTOSIS   Final   Comment: ATYPICAL LYMPHOCYTES                          SMUDGE CELLS  . Retic Ct Pct 03/21/2014 1.0  0.4 - 3.1 % Final  . RBC. 03/21/2014 5.09  4.22 - 5.81 MIL/uL Final  . Retic Count, Manual 03/21/2014 50.9  19.0 - 186.0 K/uL Final  Hospital Outpatient Visit on 03/12/2014  Component Date Value Ref Range Status  . Creatinine, Ser 03/12/2014 1.30  0.50 - 1.35 mg/dL Final    PATHOLOGY: CD20 positive CLL.  Urinalysis No results found for this basename: colorurine,  appearanceur,  labspec,  phurine,  glucoseu,  hgbur,  bilirubinur,  ketonesur,  proteinur,  urobilinogen,  nitrite,  leukocytesur    RADIOGRAPHIC STUDIES: CT Chest Abdomen Pelvis W Contrast  03/12/2014   CLINICAL DATA:  Subsequent encounter, CLL (chronic lymphocytic leukemia) C91.10 (ICD-10-CM).  EXAM: CT CHEST, ABDOMEN, AND  PELVIS WITH CONTRAST  TECHNIQUE: Multidetector CT imaging of the chest, abdomen and pelvis was performed following the standard protocol during bolus administration of intravenous contrast.  CONTRAST:  179mL OMNIPAQUE IOHEXOL 300 MG/ML  SOLN  COMPARISON:  CT chest abdomen 08/20/2013 and and CT abdomen pelvis 06/04/2013.  FINDINGS: CT CHEST FINDINGS  Supraclavicular lymph nodes are subcentimeter in short axis size. Mediastinal lymph nodes measure up to 9 mm in short axis in the AP window, stable. No hilar adenopathy. There are numerous subpectoral and axillary lymph nodes bilaterally, measuring up to 1.2 cm on the right, stable. Three-vessel coronary artery calcification. Heart size normal. No pericardial effusion. There may be a tiny hiatal hernia.  Minimal  dependent atelectasis bilaterally. Lungs are otherwise clear. No pleural fluid. Airway is unremarkable.  CT ABDOMEN AND PELVIS FINDINGS  Hepatobiliary: Liver is unremarkable. Small stones layer in the gallbladder. No biliary ductal dilatation.  Pancreas: Negative.  Spleen: Negative.  Adrenals/Urinary Tract: Adrenal glands and right kidney are unremarkable. 4 mm low-attenuation lesion in the upper pole left kidney is too small to characterize but likely a cyst. Ureters decompressed. Prostate indents the bladder base. Bladder is otherwise unremarkable.  Stomach/Bowel: Stomach and small bowel are unremarkable. Patient is reportedly status post appendectomy. Colon is unremarkable.  Vascular/Lymphatic: Atherosclerotic calcification of the arterial vasculature without abdominal aortic aneurysm. Retrocrural lymph nodes measure up to 9 mm, stable. Retroperitoneal periaortic lymph nodes measure up to 1.5 cm (series 2, image 75), stable. Bilateral iliac chain lymph nodes measure up to 1.8 cm in the left common iliac station (series 2, image 80), stable. Obturator lymph nodes measure up to 1.5 cm on the left, stable from 06/04/2013. Inguinal lymph nodes measure up to 1.7 cm on the left, also stable.  Reproductive: Prostate is at the upper limits of normal in size.  Other: No free fluid.  Mesenteries and peritoneum are unremarkable.  Musculoskeletal: No worrisome lytic or sclerotic lesions. Degenerative changes are seen in the spine.  IMPRESSION: 1. Thoracic, abdominal and pelvic adenopathy is stable. 2. Three-vessel coronary artery calcification. 3. Cholelithiasis.   Electronically Signed   By: Lorin Picket M.D.   On: 03/12/2014 11:19    ASSESSMENT:  #1. Chronic lymphocytic leukemia, stage I, ZAP negative, with abdominal lymphadenopathy, stable and most recent CT scan done in October 2015. #2. Coronary artery disease, status post STEMI, no evidence of heart failure or dysrhythmia  #3. Benign prostatic hypertrophy,  minimally symptomatic.  #4. Cholelithiasis, asymptomatic.  #5. Coronary artery disease, no evidence of dysrhythmia or heart failure.  #6. Diverticulosis, quiescient    PLAN:  #1. Continue watchful expectation. #2. Call if develops severe fatigue, dark urine, fever, or worsening lymphadenopathy. #3. Followup in 3 months with CBC, reticulocyte count, chem profile, LDH, and beta-2 microglobulin. Plan repeat CT scan in 6 months.   All questions were answered. The patient knows to call the clinic with any problems, questions or concerns. We can certainly see the patient much sooner if necessary.   I spent 25 minutes counseling the patient face to face. The total time spent in the appointment was 30 minutes.    Doroteo Bradford, MD  03/21/2014 11:08 AM  DISCLAIMER:  This note was dictated with voice recognition software.  Similar sounding words can inadvertently be transcribed inaccurately and may not be corrected upon review.

## 2014-03-21 NOTE — Progress Notes (Signed)
Labs for retic,b48m,cea,ldh,cmp,cbcd

## 2014-03-21 NOTE — Progress Notes (Signed)
[name] Had the flu vaccine today

## 2014-03-22 LAB — CEA: CEA: 1.2 ng/mL (ref 0.0–5.0)

## 2014-03-24 ENCOUNTER — Ambulatory Visit (INDEPENDENT_AMBULATORY_CARE_PROVIDER_SITE_OTHER): Payer: Medicare Other | Admitting: Cardiology

## 2014-03-24 ENCOUNTER — Other Ambulatory Visit: Payer: Self-pay | Admitting: *Deleted

## 2014-03-24 VITALS — BP 110/58 | HR 53 | Ht 69.0 in | Wt 179.0 lb

## 2014-03-24 DIAGNOSIS — E785 Hyperlipidemia, unspecified: Secondary | ICD-10-CM

## 2014-03-24 DIAGNOSIS — Z9861 Coronary angioplasty status: Secondary | ICD-10-CM

## 2014-03-24 DIAGNOSIS — I2510 Atherosclerotic heart disease of native coronary artery without angina pectoris: Secondary | ICD-10-CM

## 2014-03-24 DIAGNOSIS — R600 Localized edema: Secondary | ICD-10-CM

## 2014-03-24 LAB — BETA 2 MICROGLOBULIN, SERUM: Beta-2 Microglobulin: 4.03 mg/L — ABNORMAL HIGH (ref <=2.51)

## 2014-03-24 MED ORDER — NITROGLYCERIN 0.4 MG SL SUBL: 0.4000 mg | SUBLINGUAL_TABLET | SUBLINGUAL | Status: DC | PRN

## 2014-03-24 NOTE — Patient Instructions (Signed)
Your physician recommends that you schedule a follow-up appointment in: 6 months with Dr Harding 
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
## 2014-03-26 ENCOUNTER — Encounter: Payer: Self-pay | Admitting: Cardiology

## 2014-03-26 NOTE — Assessment & Plan Note (Addendum)
Usually stable on HCTZ and foot elevation. We talked again about using support hose /compression stockings

## 2014-03-26 NOTE — Assessment & Plan Note (Signed)
He is stable. Not taking aspirin any more because of GI concerns. No further GI bleeding on Plavix alone. Is also on Vytorin. Not on a beta blocker due to bradycardia. No real blood pressure at ACE inhibitor or ARB. We chose the HCTZ which helps with his chronic lower extremity edema.

## 2014-03-26 NOTE — Assessment & Plan Note (Signed)
Doing well on current dose of statin/Zetia along with L-arginine. Recent labs look great

## 2014-05-15 ENCOUNTER — Encounter (HOSPITAL_COMMUNITY): Payer: Self-pay | Admitting: Cardiology

## 2014-06-03 ENCOUNTER — Encounter: Payer: Self-pay | Admitting: Internal Medicine

## 2014-06-23 ENCOUNTER — Ambulatory Visit (HOSPITAL_COMMUNITY): Payer: Medicare Other | Admitting: Hematology & Oncology

## 2014-06-23 ENCOUNTER — Other Ambulatory Visit (HOSPITAL_COMMUNITY): Payer: Medicare Other

## 2014-08-14 ENCOUNTER — Encounter (HOSPITAL_BASED_OUTPATIENT_CLINIC_OR_DEPARTMENT_OTHER): Payer: Medicare Other

## 2014-08-14 ENCOUNTER — Encounter (HOSPITAL_COMMUNITY): Payer: Self-pay | Admitting: Hematology & Oncology

## 2014-08-14 ENCOUNTER — Encounter (HOSPITAL_COMMUNITY): Payer: Medicare Other | Attending: Hematology & Oncology | Admitting: Hematology & Oncology

## 2014-08-14 VITALS — BP 158/74 | HR 58 | Temp 97.7°F | Resp 18 | Wt 182.1 lb

## 2014-08-14 DIAGNOSIS — C9110 Chronic lymphocytic leukemia of B-cell type not having achieved remission: Secondary | ICD-10-CM

## 2014-08-14 LAB — CBC WITH DIFFERENTIAL/PLATELET
Basophils Absolute: 0.1 10*3/uL (ref 0.0–0.1)
Basophils Relative: 0 % (ref 0–1)
Eosinophils Absolute: 0.3 10*3/uL (ref 0.0–0.7)
Eosinophils Relative: 1 % (ref 0–5)
HCT: 51.1 % (ref 39.0–52.0)
Hemoglobin: 15.9 g/dL (ref 13.0–17.0)
Lymphocytes Relative: 92 % — ABNORMAL HIGH (ref 12–46)
Lymphs Abs: 51.8 10*3/uL — ABNORMAL HIGH (ref 0.7–4.0)
MCH: 29.5 pg (ref 26.0–34.0)
MCHC: 31.1 g/dL (ref 30.0–36.0)
MCV: 94.8 fL (ref 78.0–100.0)
Monocytes Absolute: 1.3 10*3/uL — ABNORMAL HIGH (ref 0.1–1.0)
Monocytes Relative: 2 % — ABNORMAL LOW (ref 3–12)
Neutro Abs: 2.9 10*3/uL (ref 1.7–7.7)
Neutrophils Relative %: 5 % — ABNORMAL LOW (ref 43–77)
Platelets: 217 10*3/uL (ref 150–400)
RBC: 5.39 MIL/uL (ref 4.22–5.81)
RDW: 14.8 % (ref 11.5–15.5)
WBC: 56.4 10*3/uL (ref 4.0–10.5)

## 2014-08-14 LAB — COMPREHENSIVE METABOLIC PANEL
ALT: 16 U/L (ref 0–53)
AST: 30 U/L (ref 0–37)
Albumin: 4 g/dL (ref 3.5–5.2)
Alkaline Phosphatase: 55 U/L (ref 39–117)
Anion gap: 5 (ref 5–15)
BUN: 16 mg/dL (ref 6–23)
CO2: 26 mmol/L (ref 19–32)
Calcium: 9.1 mg/dL (ref 8.4–10.5)
Chloride: 108 mmol/L (ref 96–112)
Creatinine, Ser: 1.31 mg/dL (ref 0.50–1.35)
GFR calc Af Amer: 62 mL/min — ABNORMAL LOW (ref >90)
GFR calc non Af Amer: 54 mL/min — ABNORMAL LOW (ref >90)
Glucose, Bld: 115 mg/dL — ABNORMAL HIGH (ref 70–99)
Potassium: 4.3 mmol/L (ref 3.5–5.1)
Sodium: 139 mmol/L (ref 135–145)
Total Bilirubin: 0.9 mg/dL (ref 0.3–1.2)
Total Protein: 6.7 g/dL (ref 6.0–8.3)

## 2014-08-14 LAB — LACTATE DEHYDROGENASE: LDH: 176 U/L (ref 94–250)

## 2014-08-14 LAB — RETICULOCYTES
RBC.: 5.2 MIL/uL (ref 4.22–5.81)
Retic Count, Absolute: 52 10*3/uL (ref 19.0–186.0)
Retic Ct Pct: 1 % (ref 0.4–3.1)

## 2014-08-14 NOTE — Progress Notes (Signed)
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 male returns for follow-up of CLL. He states he feels well. He walks every day and plays golf. He eats well. He has occasional acid reflux. He denies night sweats or a significant change in his energy level. He states he wants to lose some weight but that is difficult to do. He is here today for ongoing observation.  MEDICAL HISTORY: Past Medical History  Diagnosis Date  . Diverticulitis   . Hypercholesteremia   . BPH (benign prostatic hyperplasia) 04/08/2011  . Gallstone   . Depression   . Headache(784.0)   . GERD (gastroesophageal reflux disease)   . H/O Non-ST elevation MI (NSTEMI) 11/23/12    RCA lesion - PCI with DES; moderate LAD disease  . CAD S/P percutaneous coronary angioplasty 11/23/12    2 overlapping mRCA lesions - Xience Xpedition DES 2.75 mm x 18 mm (3.0 mm)  . PTSD (post-traumatic stress disorder)     With Depression - since MI  . 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     has No Known Allergies.  Mr. Boike does not currently have medications on file.  SURGICAL HISTORY: Past Surgical History  Procedure Laterality Date  .  Appendectomy  [id number]  . Colonoscopy  2008    YNW:[id number] diverticula, diminutive polyp in the cecum, s/p bx Normal rectum. Tubular adenoma  . Colonoscopy N/A 10/17/2012    Dr. Gala Romney: colonic diverticulosis, tubular adenoma, surveillance due May 2019  . Cardiac catheterization  11/24/12    NSTEMI: Culprit = mid RCA 95% & 75%; LAD ~40-50%, Small RI ~60%; EF ~60%, mild inf-basal HK  . Coronary stent placement  11/24/12    Mid RCA -- Xience eXp - 2.75 mm x 18 mm DES (3.106mm) , Dr. Ellyn Hack  . Left heart catheterization with coronary angiogram N/A 11/23/2012    Procedure: LEFT HEART CATHETERIZATION WITH CORONARY ANGIOGRAM;  Surgeon: Leonie Man, MD;  Location: [facility] CATH LAB;  Service: Cardiovascular;  Laterality: N/A;    SOCIAL HISTORY: History   Social History  . Marital Status: Married    Spouse Name: N/A  . Number of Children: N/A  . Years of Education: N/A   Occupational History  . Not on file.   Social History Main Topics  . Smoking status: Former Smoker -- 0.25 packs/day for 2 years    Types: Cigarettes    Quit date: 06/07/1963  . Smokeless tobacco: Never Used  . Alcohol Use: No     Comment: Occasionally drinks red wine  . Drug Use: No  . Sexual Activity: Not on file   Other Topics Concern  . Not on file   Social History Narrative   Currently in Cardiac Rehabilitation   "  former smoker". Does not drink EtOH   Married.    FAMILY HISTORY: Family History  Problem Relation Age of Onset  . Diabetes Mother   . Cancer Father   . Colon cancer Neg Hx     Review of Systems  Constitutional: Negative for fever, chills, weight loss and malaise/fatigue.  HENT: Negative for congestion, hearing loss, nosebleeds, sore throat and tinnitus.   Eyes: Negative for blurred vision, double vision, pain and discharge.  Respiratory: Negative for cough, hemoptysis, sputum production, shortness of breath and wheezing.   Cardiovascular: Negative for chest pain, palpitations, claudication,  leg swelling and PND.  Gastrointestinal: Positive for heartburn. Negative for nausea, vomiting, abdominal pain, diarrhea, constipation, blood in stool and melena.  Genitourinary: Negative for dysuria, urgency, frequency and hematuria.  Musculoskeletal: Negative for myalgias, joint pain and falls.  Skin: Negative for itching and rash.  Neurological: Negative for dizziness, tingling, tremors, sensory change, speech change, focal weakness, seizures, loss of consciousness, weakness and headaches.  Endo/Heme/Allergies: Does not bruise/bleed easily.  Psychiatric/Behavioral: Negative for depression, suicidal ideas, memory loss and substance abuse. The patient is not nervous/anxious and does not have insomnia.     PHYSICAL EXAMINATION  ECOG PERFORMANCE STATUS: 0 - Asymptomatic  Filed Vitals:   08/14/14 1000  BP: 158/74  Pulse: 58  Temp: 97.7 F (36.5 C)  Resp: 18    Physical Exam  Constitutional: He is oriented to person, place, and time and well-developed, well-nourished, and in no distress.  HENT:  Head: Normocephalic and atraumatic.  Nose: Nose normal.  Mouth/Throat: Oropharynx is clear and moist. No oropharyngeal exudate.  Eyes: Conjunctivae and EOM are normal. Pupils are equal, round, and reactive to light. Right eye exhibits no discharge. Left eye exhibits no discharge. No scleral icterus.  Neck: Normal range of motion. Neck supple. No tracheal deviation present. No thyromegaly present.  Cardiovascular: Normal rate, regular rhythm and normal heart sounds.  Exam reveals no gallop and no friction rub.   No murmur heard. Pulmonary/Chest: Effort normal and breath sounds normal. He has no wheezes. He has no rales.  Abdominal: Soft. Bowel sounds are normal. He exhibits no distension and no mass. There is no tenderness. There is no rebound and no guarding.  Musculoskeletal: Normal range of motion. He exhibits no edema.  Lymphadenopathy:       Head (left side): Submandibular adenopathy  present.    He has no cervical adenopathy.  Small palpable node Left submandibular region about 1 cm  Neurological: He is alert and oriented to person, place, and time. He has normal reflexes. No cranial nerve deficit. Gait normal. Coordination normal.  Skin: Skin is warm and dry. No rash noted.  Psychiatric: Mood, memory, affect and judgment normal.  Nursing note and vitals reviewed.   LABORATORY DATA:  CBC    Component Value Date/Time   WBC 56.4* 08/14/2014 1044   RBC 5.39 08/14/2014 1044   RBC 5.20 08/14/2014 1044   HGB 15.9 08/14/2014 1044   HCT 51.1 08/14/2014 1044   PLT 217 08/14/2014 1044   MCV 94.8 08/14/2014 1044   MCH 29.5 08/14/2014 1044   MCHC 31.1 08/14/2014 1044   RDW 14.8 08/14/2014 1044   LYMPHSABS 51.8* 08/14/2014 1044   MONOABS 1.3* 08/14/2014 1044   EOSABS 0.3 08/14/2014 1044   BASOSABS 0.1 08/14/2014 1044   CMP     Component Value Date/Time   NA 139 08/14/2014 1044   K 4.3 08/14/2014 1044   CL 108 08/14/2014 1044   CO2  26 08/14/2014 1044   GLUCOSE 115* 08/14/2014 1044   BUN 16 08/14/2014 1044   CREATININE 1.31 08/14/2014 1044   CREATININE 1.22 10/08/2013 1152   CALCIUM 9.1 08/14/2014 1044   PROT 6.7 08/14/2014 1044   ALBUMIN 4.0 08/14/2014 1044   AST 30 08/14/2014 1044   ALT 16 08/14/2014 1044   ALKPHOS 55 08/14/2014 1044   BILITOT 0.9 08/14/2014 1044   GFRNONAA 54* 08/14/2014 1044   GFRAA 62* 08/14/2014 1044      ASSESSMENT and THERAPY PLAN:    CLL (chronic lymphocytic leukemia) 69 year old male with CLL. His counts have been stable for many years. White count today is relatively unchanged and on historical review has been in the 50,000 range in the past. His other counts are preserved. He is asymptomatic. Based on his exam and prior CT scans to be considered a stage I. I advised him we will continue with observation and plan on seeing him back in 4 months. If his counts remain stable and he remains asymptomatic we can certainly space his  visits out to six-month intervals.   All questions were answered. The patient knows to call the clinic with any problems, questions or concerns. We can certainly see the patient much sooner if necessary. This note was electronically signed. Molli Hazard 08/14/2014

## 2014-08-14 NOTE — Assessment & Plan Note (Signed)
69 year old male with CLL. His counts have been stable for many years. White count today is relatively unchanged and on historical review has been in the 50,000 range in the past. His other counts are preserved. He is asymptomatic. Based on his exam and prior CT scans to be considered a stage I. I advised him we will continue with observation and plan on seeing him back in 4 months. If his counts remain stable and he remains asymptomatic we can certainly space his visits out to six-month intervals.

## 2014-08-14 NOTE — Progress Notes (Signed)
LABS DRAWN

## 2014-08-14 NOTE — Progress Notes (Signed)
CRITICAL VALUE ALERT Critical value received:  WBC 56.4 Date of notification:  08/14/2014 Time of notification: 9842 Critical value read back:  Yes.   Nurse who received alert:  Shellia Carwin RN MD notified (1st page):  Kirby Crigler PA-C

## 2014-08-14 NOTE — Patient Instructions (Addendum)
[place] [facility]  Discharge Instructions:  You saw Dr Whitney Muse today.  Report fevers, night sweats, etc. Follow up with the doctor in 4 months _______________________________________________________________  Thank you for choosing [place] [facility] to provide your oncology and hematology care.  To afford each patient quality time with our providers, please arrive at least 15 minutes before your scheduled appointment.  You need to re-schedule your appointment if you arrive 10 or more minutes late.  We strive to give you quality time with our providers, and arriving late affects you and other patients whose appointments are after yours.  Also, if you no show three or more times for appointments you may be dismissed from the clinic.  Again, thank you for choosing [facility] hope is that these requests will allow you access to exceptional care and in a timely manner. _______________________________________________________________  If you have questions after your visit, please contact our office at [phone] between the hours of 8:30 a.m. and 5:00 p.m. Voicemails left after 4:30 p.m. will not be returned until the following business day. _______________________________________________________________  For prescription refill requests, have your pharmacy contact our office. _______________________________________________________________  Recommendations made by the consultant and any test results will be sent to your referring physician. _______________________________________________________________

## 2014-08-15 LAB — BETA 2 MICROGLOBULIN, SERUM: Beta-2 Microglobulin: 2.3 mg/L (ref 0.6–2.4)

## 2014-09-08 ENCOUNTER — Telehealth: Payer: Self-pay | Admitting: Cardiology

## 2014-09-10 NOTE — Telephone Encounter (Signed)
Close encounters °

## 2014-11-20 ENCOUNTER — Encounter: Payer: Self-pay | Admitting: Cardiology

## 2014-11-20 ENCOUNTER — Ambulatory Visit (INDEPENDENT_AMBULATORY_CARE_PROVIDER_SITE_OTHER): Payer: Medicare Other | Admitting: Cardiology

## 2014-11-20 VITALS — BP 126/62 | HR 75 | Ht 69.0 in | Wt 183.9 lb

## 2014-11-20 DIAGNOSIS — I214 Non-ST elevation (NSTEMI) myocardial infarction: Secondary | ICD-10-CM

## 2014-11-20 DIAGNOSIS — I2510 Atherosclerotic heart disease of native coronary artery without angina pectoris: Secondary | ICD-10-CM

## 2014-11-20 DIAGNOSIS — R600 Localized edema: Secondary | ICD-10-CM

## 2014-11-20 DIAGNOSIS — R739 Hyperglycemia, unspecified: Secondary | ICD-10-CM

## 2014-11-20 DIAGNOSIS — E785 Hyperlipidemia, unspecified: Secondary | ICD-10-CM | POA: Diagnosis not present

## 2014-11-20 DIAGNOSIS — Z9861 Coronary angioplasty status: Secondary | ICD-10-CM
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
## 2014-11-20 NOTE — Patient Instructions (Addendum)
MAY USE LASIX LITTLE MORE FREQUENT FOR SWELLING  WEAR  COMPRESSION STOCKING FOR SWELLING AND VARICOSE VEINS   NO CHANGE WITH OTHER MEDICATIONS   PLEASE HAVE YOUR PRIMARY SEND A COPY OF YOUR CHOLESTEROL LEVEL WHEN COMPLETED Your physician wants you to follow-up in 6 MONTH DR HARDING. You will receive a reminder letter in the mail two months in advance. If you don't receive a letter, please call our office to schedule the follow-up appointment.

## 2014-11-22 ENCOUNTER — Encounter: Payer: Self-pay | Admitting: Cardiology

## 2014-11-22 NOTE — Assessment & Plan Note (Signed)
No recurrent anginal symptoms or heart failure symptoms. On minimal cardiac medications

## 2014-11-22 NOTE — Assessment & Plan Note (Signed)
Stable on Plavix alone. On Vytorin. No beta blocker due to bradycardia. Taking HCTZ to help edema otherwise no need for ACE inhibitor or ARB in

## 2014-11-22 NOTE — Assessment & Plan Note (Addendum)
Continue HCTZ. I continue to recommend the use of support hose/compression stockings. I recommended that she has difficulty attaining is over-the-counter we can provide a prescription. I would only use when necessary Lasix as he has no heart failure symptoms. If he becomes symptomatic, I would also consider referral to Vein and Vascular clinic

## 2014-11-22 NOTE — Assessment & Plan Note (Signed)
Relatively well-controlled by recent lab studies. On Vytorin.Monitored by PCP. Last labs showed elevated crit was rising otherwise normal levels. Due for labs to be checked this summer.

## 2014-12-16 ENCOUNTER — Encounter (HOSPITAL_COMMUNITY): Payer: Self-pay | Admitting: Hematology & Oncology

## 2014-12-16 ENCOUNTER — Encounter (HOSPITAL_BASED_OUTPATIENT_CLINIC_OR_DEPARTMENT_OTHER): Payer: Medicare Other

## 2014-12-16 ENCOUNTER — Encounter (HOSPITAL_COMMUNITY): Payer: Medicare Other | Attending: Hematology & Oncology | Admitting: Hematology & Oncology

## 2014-12-16 VITALS — BP 157/79 | HR 68 | Temp 97.7°F | Resp 18 | Wt 179.0 lb

## 2014-12-16 DIAGNOSIS — E780 Pure hypercholesterolemia: Secondary | ICD-10-CM | POA: Insufficient documentation

## 2014-12-16 DIAGNOSIS — Z87891 Personal history of nicotine dependence: Secondary | ICD-10-CM | POA: Insufficient documentation

## 2014-12-16 DIAGNOSIS — C9110 Chronic lymphocytic leukemia of B-cell type not having achieved remission: Secondary | ICD-10-CM

## 2014-12-16 DIAGNOSIS — K219 Gastro-esophageal reflux disease without esophagitis: Secondary | ICD-10-CM | POA: Insufficient documentation

## 2014-12-16 LAB — CBC WITH DIFFERENTIAL/PLATELET
Basophils Absolute: 0.1 10*3/uL (ref 0.0–0.1)
Basophils Relative: 0 % (ref 0–1)
Eosinophils Absolute: 0.2 10*3/uL (ref 0.0–0.7)
Eosinophils Relative: 0 % (ref 0–5)
HCT: 50.8 % (ref 39.0–52.0)
Hemoglobin: 16.2 g/dL (ref 13.0–17.0)
Lymphocytes Relative: 93 % — ABNORMAL HIGH (ref 12–46)
Lymphs Abs: 53.3 10*3/uL — ABNORMAL HIGH (ref 0.7–4.0)
MCH: 30.7 pg (ref 26.0–34.0)
MCHC: 31.9 g/dL (ref 30.0–36.0)
MCV: 96.4 fL (ref 78.0–100.0)
Monocytes Absolute: 0.9 10*3/uL (ref 0.1–1.0)
Monocytes Relative: 2 % — ABNORMAL LOW (ref 3–12)
Neutro Abs: 3.1 10*3/uL (ref 1.7–7.7)
Neutrophils Relative %: 5 % — ABNORMAL LOW (ref 43–77)
Platelets: 166 10*3/uL (ref 150–400)
RBC: 5.27 MIL/uL (ref 4.22–5.81)
RDW: 14.8 % (ref 11.5–15.5)
WBC: 57.6 10*3/uL (ref 4.0–10.5)

## 2014-12-16 LAB — COMPREHENSIVE METABOLIC PANEL
ALT: 23 U/L (ref 17–63)
AST: 30 U/L (ref 15–41)
Albumin: 3.9 g/dL (ref 3.5–5.0)
Alkaline Phosphatase: 51 U/L (ref 38–126)
Anion gap: 8 (ref 5–15)
BUN: 16 mg/dL (ref 6–20)
CO2: 24 mmol/L (ref 22–32)
Calcium: 8.9 mg/dL (ref 8.9–10.3)
Chloride: 109 mmol/L (ref 101–111)
Creatinine, Ser: 1.3 mg/dL — ABNORMAL HIGH (ref 0.61–1.24)
GFR calc Af Amer: >60 mL/min (ref >60)
GFR calc non Af Amer: 54 mL/min — ABNORMAL LOW (ref >60)
Glucose, Bld: 119 mg/dL — ABNORMAL HIGH (ref 65–99)
Potassium: 4.1 mmol/L (ref 3.5–5.1)
Sodium: 141 mmol/L (ref 135–145)
Total Bilirubin: 1.2 mg/dL (ref 0.3–1.2)
Total Protein: 6.7 g/dL (ref 6.5–8.1)

## 2014-12-16 LAB — LACTATE DEHYDROGENASE: LDH: 155 U/L (ref 98–192)

## 2014-12-16 NOTE — Progress Notes (Signed)
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 male returns for follow-up of CLL. He states he feels well. He walks every day and plays golf. He eats well. He has occasional acid reflux. He denies night sweats or a significant change in his energy level. He states he wants to lose some weight but that is difficult to do. He is here today for ongoing observation.   His appetite and energy have been good.He denies any fevers, night sweats, or appetite loss.   He has an irregular spot on his left forearm he wanted checked.   He sees Dr. Gala Romney for "gallstones". He has a follow up with him soon.   MEDICAL HISTORY: Past Medical History  Diagnosis Date  . Diverticulitis   . Hypercholesteremia   . BPH (benign prostatic hyperplasia) 04/08/2011  . Gallstone   . Depression   . Headache(784.0)   . GERD (gastroesophageal reflux disease)   . H/O Non-ST elevation MI (NSTEMI) 11/23/12    RCA lesion - PCI with DES; moderate LAD disease  . CAD S/P percutaneous coronary angioplasty 11/23/12    2 overlapping mRCA lesions - Xience Xpedition DES 2.75 mm x 18 mm (3.0 mm)  . PTSD (post-traumatic stress disorder)     With Depression - since MI  . 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     has No Known Allergies.  Mr. Follett had no medications administered during this visit.  SURGICAL HISTORY: Past Surgical History  Procedure Laterality Date  . Appendectomy  [id number]  . Colonoscopy  2008    [id number] diverticula, diminutive polyp in the cecum, s/p bx Normal rectum. Tubular adenoma  . Colonoscopy N/A 10/17/2012    Dr. Gala Romney: colonic diverticulosis, tubular adenoma, surveillance due May 2019  . Cardiac catheterization  11/24/12    NSTEMI: Culprit = mid RCA 95% & 75%; LAD ~40-50%, Small RI ~60%; EF ~60%, mild inf-basal HK  . Coronary stent placement  11/24/12    Mid RCA -- Xience eXp - 2.75 mm x 18 mm DES (3.26mm) , Dr. Ellyn Hack  . Left heart catheterization with coronary angiogram N/A 11/23/2012    Procedure: LEFT HEART CATHETERIZATION WITH CORONARY ANGIOGRAM;  Surgeon: Leonie Man, MD;  Location: [facility] CATH LAB;  Service: Cardiovascular;  Laterality: N/A;    SOCIAL HISTORY: History   Social History  . Marital Status: Married    Spouse Name: N/A  . Number of Children: N/A  . Years of Education: N/A   Occupational History  . Not on file.   Social History Main Topics  . Smoking status: Former Smoker -- 0.25 packs/day for 2 years    Types: Cigarettes    Quit date: 06/07/1963  . Smokeless tobacco: Never Used  .  Alcohol Use: No     Comment: Occasionally drinks red wine  . Drug Use: No  . Sexual Activity: Not on file   Other Topics Concern  . Not on file   Social History Narrative   Currently in Cardiac Rehabilitation   "former smoker". Does not drink EtOH   Married.    FAMILY HISTORY: Family History  Problem Relation Age of Onset  . Diabetes Mother   . Cancer Father   . Colon cancer Neg Hx     Review of Systems  Constitutional: Negative for fever, chills, weight loss and malaise/fatigue.  HENT: Negative for congestion, hearing loss, nosebleeds, sore throat and  tinnitus.   Eyes: Negative for blurred vision, double vision, pain and discharge.  Respiratory: Negative for cough, hemoptysis, sputum production, shortness of breath and wheezing.   Cardiovascular: Negative for chest pain, palpitations, claudication, leg swelling and PND.  Gastrointestinal: Positive for heartburn. Negative for nausea, vomiting, abdominal pain, diarrhea, constipation, blood in stool and melena.  Genitourinary: Negative for dysuria, urgency, frequency and hematuria.  Musculoskeletal: Negative for myalgias, joint pain and falls.  Skin: Negative for itching and rash.  Neurological: Negative for dizziness, tingling, tremors, sensory change, speech change, focal weakness, seizures, loss of consciousness, weakness and headaches.  Endo/Heme/Allergies: Does not bruise/bleed easily.  Psychiatric/Behavioral: Negative for depression, suicidal ideas, memory loss and substance abuse. The patient is not nervous/anxious and does not have insomnia.     PHYSICAL EXAMINATION  ECOG PERFORMANCE STATUS: 0 - Asymptomatic  Filed Vitals:   12/16/14 1052  BP: 157/79  Pulse: 68  Temp: 97.7 F (36.5 C)  Resp: 18    Physical Exam  Constitutional: He is oriented to person, place, and time and well-developed, well-nourished, and in no distress.  HENT:  Head: Normocephalic and atraumatic.  Nose: Nose normal.  Mouth/Throat: Oropharynx is clear and moist. No oropharyngeal exudate.  Eyes: Conjunctivae and EOM are normal. Pupils are equal, round, and reactive to light. Right eye exhibits no discharge. Left eye exhibits no discharge. No scleral icterus.  Neck: Normal range of motion. Neck supple. No tracheal deviation present. No thyromegaly present.  Cardiovascular: Normal rateand normal heart sounds.  Exam reveals no gallop and no friction rub.   Occasional ectopy No murmur heard. Pulmonary/Chest: Effort normal and breath sounds normal. He has no wheezes. He has no rales.  Abdominal: Soft. Bowel  sounds are normal. He exhibits no distension and no mass. There is no tenderness. There is no rebound and no guarding.  Musculoskeletal: Normal range of motion.  Chronic lower extremity edema. Lymphadenopathy:       Head (left side): Submandibular adenopathy present.    He has no cervical adenopathy.  Small palpable node Left submandibular region about 1 cm  Neurological: He is alert and oriented to person, place, and time. He has normal reflexes. No cranial nerve deficit. Gait normal. Coordination normal.  Skin: Skin is warm and dry. No rash noted.  Dark irregular appearing lesion about the size of a pencil eraser on the left forearm.  Psychiatric: Mood, memory, affect and judgment normal.  Nursing note and vitals reviewed.   LABORATORY DATA:  CBC    Component Value Date/Time   WBC 57.6* 12/16/2014 1046   RBC 5.27 12/16/2014 1046   RBC 5.20 08/14/2014 1044   HGB 16.2 12/16/2014 1046   HCT 50.8 12/16/2014 1046   PLT 166 12/16/2014 1046   MCV 96.4 12/16/2014 1046   MCH 30.7 12/16/2014 1046   MCHC 31.9 12/16/2014  1046   RDW 14.8 12/16/2014 1046   LYMPHSABS 53.3* 12/16/2014 1046   MONOABS 0.9 12/16/2014 1046   EOSABS 0.2 12/16/2014 1046   BASOSABS 0.1 12/16/2014 1046   CMP     Component Value Date/Time   NA 141 12/16/2014 1046   K 4.1 12/16/2014 1046   CL 109 12/16/2014 1046   CO2 24 12/16/2014 1046   GLUCOSE 119* 12/16/2014 1046   BUN 16 12/16/2014 1046   CREATININE 1.30* 12/16/2014 1046   CREATININE 1.22 10/08/2013 1152   CALCIUM 8.9 12/16/2014 1046   PROT 6.7 12/16/2014 1046   ALBUMIN 3.9 12/16/2014 1046   AST 30 12/16/2014 1046   ALT 23 12/16/2014 1046   ALKPHOS 51 12/16/2014 1046   BILITOT 1.2 12/16/2014 1046   GFRNONAA 54* 12/16/2014 1046   GFRAA >60 12/16/2014 1046      ASSESSMENT and THERAPY PLAN:  CLL, stable counts, slow doubling time Left forearm lesion  From the perspective of his CLL he is stable. We will continue with ongoing observation at 6  month intervals. I again reviewed signs and symptoms of concern and the patient states he is very comfortable with these symptoms and knows to call. In regards to the lesion on his left forearm we have advised him to call the local dermatologist, Dr. Nevada Crane for follow-up and the patient is very agreeable.  We will schedule with a follow up in 6 months.  All questions were answered. The patient knows to call the clinic with any problems, questions or concerns. We can certainly see the patient much sooner if necessary. This note was electronically signed.  This document serves as a record of services personally performed by Ancil Linsey, MD. It was created on her behalf by Arlyce Harman, a trained medical scribe. The creation of this record is based on the scribe's personal observations and the provider's statements to them. This document has been checked and approved by the attending provider.  I have reviewed the above documentation for accuracy and completeness, and I agree with the above. Molli Hazard, MD

## 2014-12-16 NOTE — Progress Notes (Signed)
LABS DRAWN

## 2014-12-16 NOTE — Patient Instructions (Signed)
[place] at [facility] Discharge Instructions  RECOMMENDATIONS MADE BY THE CONSULTANT AND ANY TEST RESULTS WILL BE SENT TO YOUR REFERRING PHYSICIAN.  Return in 6 months for office visit and lab work.  Thank you for choosing [place] at [facility] to provide your oncology and hematology care.  To afford each patient quality time with our provider, please arrive at least 15 minutes before your scheduled appointment time.    You need to re-schedule your appointment should you arrive 10 or more minutes late.  We strive to give you quality time with our providers, and arriving late affects you and other patients whose appointments are after yours.  Also, if you no show three or more times for appointments you may be dismissed from the clinic at the providers discretion.     Again, thank you for choosing [facility].  Our hope is that these requests will decrease the amount of time that you wait before being seen by our physicians.       _____________________________________________________________  Should you have questions after your visit to [facility], please contact our office at [phone] between the hours of 8:30 a.m. and 4:30 p.m.  Voicemails left after 4:30 p.m. will not be returned until the following business day.  For prescription refill requests, have your pharmacy contact our office.

## 2015-03-09 ENCOUNTER — Other Ambulatory Visit (HOSPITAL_COMMUNITY): Payer: Self-pay | Admitting: Family Medicine

## 2015-03-09 ENCOUNTER — Ambulatory Visit (HOSPITAL_COMMUNITY)
Admission: RE | Admit: 2015-03-09 | Discharge: 2015-03-09 | Disposition: A | Discharge status: Home / Self Care | Payer: Medicare Other | Source: Ambulatory Visit | Attending: Family Medicine | Admitting: Family Medicine

## 2015-03-09 DIAGNOSIS — M542 Cervicalgia: Secondary | ICD-10-CM | POA: Diagnosis present

## 2015-03-09 DIAGNOSIS — M47892 Other spondylosis, cervical region: Secondary | ICD-10-CM | POA: Diagnosis not present

## 2015-03-09 DIAGNOSIS — R52 Pain, unspecified: Secondary | ICD-10-CM

## 2015-03-09 DIAGNOSIS — M8580 Other specified disorders of bone density and structure, unspecified site: Secondary | ICD-10-CM | POA: Insufficient documentation

## 2015-06-16 ENCOUNTER — Inpatient Hospital Stay (HOSPITAL_COMMUNITY)
Admission: AD | Admit: 2015-06-16 | Discharge: 2015-06-18 | DRG: 247 | Disposition: A | Discharge status: Home / Self Care | Payer: Medicare HMO | Attending: Cardiology | Admitting: Cardiology

## 2015-06-16 ENCOUNTER — Encounter (HOSPITAL_COMMUNITY)
Admission: AD | Disposition: A | Discharge status: Home / Self Care | Payer: Self-pay | Source: Home / Self Care | Attending: Cardiology

## 2015-06-16 ENCOUNTER — Encounter (HOSPITAL_COMMUNITY): Payer: Self-pay | Admitting: Student

## 2015-06-16 DIAGNOSIS — I213 ST elevation (STEMI) myocardial infarction of unspecified site: Secondary | ICD-10-CM | POA: Diagnosis not present

## 2015-06-16 DIAGNOSIS — C9110 Chronic lymphocytic leukemia of B-cell type not having achieved remission: Secondary | ICD-10-CM | POA: Diagnosis not present

## 2015-06-16 DIAGNOSIS — Z7982 Long term (current) use of aspirin: Secondary | ICD-10-CM

## 2015-06-16 DIAGNOSIS — I255 Ischemic cardiomyopathy: Secondary | ICD-10-CM | POA: Diagnosis present

## 2015-06-16 DIAGNOSIS — Z7902 Long term (current) use of antithrombotics/antiplatelets: Secondary | ICD-10-CM | POA: Diagnosis not present

## 2015-06-16 DIAGNOSIS — I2510 Atherosclerotic heart disease of native coronary artery without angina pectoris: Secondary | ICD-10-CM | POA: Diagnosis not present

## 2015-06-16 DIAGNOSIS — Z87891 Personal history of nicotine dependence: Secondary | ICD-10-CM | POA: Diagnosis not present

## 2015-06-16 DIAGNOSIS — I2119 ST elevation (STEMI) myocardial infarction involving other coronary artery of inferior wall: Secondary | ICD-10-CM | POA: Diagnosis not present

## 2015-06-16 DIAGNOSIS — R110 Nausea: Secondary | ICD-10-CM | POA: Diagnosis not present

## 2015-06-16 DIAGNOSIS — F4310 Post-traumatic stress disorder, unspecified: Secondary | ICD-10-CM | POA: Diagnosis present

## 2015-06-16 DIAGNOSIS — F329 Major depressive disorder, single episode, unspecified: Secondary | ICD-10-CM | POA: Diagnosis present

## 2015-06-16 DIAGNOSIS — N401 Enlarged prostate with lower urinary tract symptoms: Secondary | ICD-10-CM | POA: Diagnosis not present

## 2015-06-16 DIAGNOSIS — I2121 ST elevation (STEMI) myocardial infarction involving left circumflex coronary artery: Secondary | ICD-10-CM | POA: Diagnosis not present

## 2015-06-16 DIAGNOSIS — I10 Essential (primary) hypertension: Secondary | ICD-10-CM

## 2015-06-16 DIAGNOSIS — I252 Old myocardial infarction: Secondary | ICD-10-CM

## 2015-06-16 DIAGNOSIS — Z955 Presence of coronary angioplasty implant and graft: Secondary | ICD-10-CM | POA: Diagnosis not present

## 2015-06-16 DIAGNOSIS — R079 Chest pain, unspecified: Secondary | ICD-10-CM | POA: Diagnosis not present

## 2015-06-16 DIAGNOSIS — K219 Gastro-esophageal reflux disease without esophagitis: Secondary | ICD-10-CM | POA: Diagnosis not present

## 2015-06-16 DIAGNOSIS — E785 Hyperlipidemia, unspecified: Secondary | ICD-10-CM

## 2015-06-16 DIAGNOSIS — Z79899 Other long term (current) drug therapy: Secondary | ICD-10-CM | POA: Diagnosis not present

## 2015-06-16 DIAGNOSIS — R61 Generalized hyperhidrosis: Secondary | ICD-10-CM | POA: Diagnosis not present

## 2015-06-16 DIAGNOSIS — R338 Other retention of urine: Secondary | ICD-10-CM | POA: Diagnosis present

## 2015-06-16 DIAGNOSIS — R319 Hematuria, unspecified: Secondary | ICD-10-CM | POA: Diagnosis not present

## 2015-06-16 DIAGNOSIS — R69 Illness, unspecified: Secondary | ICD-10-CM | POA: Diagnosis not present

## 2015-06-16 DIAGNOSIS — Z9861 Coronary angioplasty status: Secondary | ICD-10-CM

## 2015-06-16 HISTORY — DX: Headache, unspecified: R51.9

## 2015-06-16 HISTORY — DX: Non-ST elevation (NSTEMI) myocardial infarction: I21.4

## 2015-06-16 HISTORY — DX: Ischemic cardiomyopathy: I25.5

## 2015-06-16 HISTORY — DX: Other chronic pain: G89.29

## 2015-06-16 HISTORY — PX: CARDIAC CATHETERIZATION: SHX172

## 2015-06-16 HISTORY — DX: Headache: R51

## 2015-06-16 HISTORY — DX: ST elevation (STEMI) myocardial infarction involving other coronary artery of inferior wall: I21.19

## 2015-06-16 LAB — POCT I-STAT, CHEM 8
BUN: 13 mg/dL (ref 6–20)
Calcium, Ion: 1.25 mmol/L (ref 1.13–1.30)
Chloride: 107 mmol/L (ref 101–111)
Creatinine, Ser: 1.1 mg/dL (ref 0.61–1.24)
Glucose, Bld: 129 mg/dL — ABNORMAL HIGH (ref 65–99)
HCT: 48 % (ref 39.0–52.0)
Hemoglobin: 16.3 g/dL (ref 13.0–17.0)
Potassium: 3.7 mmol/L (ref 3.5–5.1)
Sodium: 142 mmol/L (ref 135–145)
TCO2: 22 mmol/L (ref 0–100)

## 2015-06-16 LAB — LIPID PANEL
Cholesterol: 145 mg/dL (ref 0–200)
HDL: 29 mg/dL — ABNORMAL LOW (ref >40)
LDL Cholesterol: 92 mg/dL (ref 0–99)
Total CHOL/HDL Ratio: 5 RATIO
Triglycerides: 122 mg/dL (ref <150)
VLDL: 24 mg/dL (ref 0–40)

## 2015-06-16 LAB — DIFFERENTIAL
Basophils Absolute: 0 10*3/uL (ref 0.0–0.1)
Basophils Relative: 0 %
Eosinophils Absolute: 0 10*3/uL (ref 0.0–0.7)
Eosinophils Relative: 0 %
Lymphocytes Relative: 90 %
Lymphs Abs: 44.1 10*3/uL — ABNORMAL HIGH (ref 0.7–4.0)
Monocytes Absolute: 1 10*3/uL (ref 0.1–1.0)
Monocytes Relative: 2 %
Neutro Abs: 3.9 10*3/uL (ref 1.7–7.7)
Neutrophils Relative %: 8 %

## 2015-06-16 LAB — PROTIME-INR
INR: 1.18 (ref 0.00–1.49)
Prothrombin Time: 15.2 seconds (ref 11.6–15.2)

## 2015-06-16 LAB — CBC
HCT: 48 % (ref 39.0–52.0)
Hemoglobin: 15 g/dL (ref 13.0–17.0)
MCH: 30.2 pg (ref 26.0–34.0)
MCHC: 31.3 g/dL (ref 30.0–36.0)
MCV: 96.8 fL (ref 78.0–100.0)
Platelets: 169 10*3/uL (ref 150–400)
RBC: 4.96 MIL/uL (ref 4.22–5.81)
RDW: 14.7 % (ref 11.5–15.5)
WBC: 49 10*3/uL — ABNORMAL HIGH (ref 4.0–10.5)

## 2015-06-16 LAB — BASIC METABOLIC PANEL
Anion gap: 9 (ref 5–15)
BUN: 12 mg/dL (ref 6–20)
CO2: 24 mmol/L (ref 22–32)
Calcium: 8.7 mg/dL — ABNORMAL LOW (ref 8.9–10.3)
Chloride: 107 mmol/L (ref 101–111)
Creatinine, Ser: 1.29 mg/dL — ABNORMAL HIGH (ref 0.61–1.24)
GFR calc Af Amer: >60 mL/min (ref >60)
GFR calc non Af Amer: 54 mL/min — ABNORMAL LOW (ref >60)
Glucose, Bld: 128 mg/dL — ABNORMAL HIGH (ref 65–99)
Potassium: 5.1 mmol/L (ref 3.5–5.1)
Sodium: 140 mmol/L (ref 135–145)

## 2015-06-16 LAB — PLATELET COUNT: Platelets: 172 10*3/uL (ref 150–400)

## 2015-06-16 LAB — MRSA PCR SCREENING: MRSA by PCR: NEGATIVE

## 2015-06-16 LAB — PLATELET INHIBITION P2Y12

## 2015-06-16 LAB — TROPONIN I: Troponin I: 1.5 ng/mL (ref <0.031)

## 2015-06-16 LAB — APTT: aPTT: 32 seconds (ref 24–37)

## 2015-06-16 LAB — POCT ACTIVATED CLOTTING TIME: Activated Clotting Time: 358 seconds

## 2015-06-16 SURGERY — LEFT HEART CATH AND CORONARY ANGIOGRAPHY
Anesthesia: LOCAL

## 2015-06-16 MED ORDER — HEPARIN (PORCINE) IN NACL 2-0.9 UNIT/ML-% IJ SOLN
INTRAMUSCULAR | Status: AC
  Filled 2015-06-16: qty 1000

## 2015-06-16 MED ORDER — TIROFIBAN (AGGRASTAT) BOLUS VIA INFUSION
INTRAVENOUS | Status: DC | PRN
  Administered 2015-06-16: 2025 ug via INTRAVENOUS

## 2015-06-16 MED ORDER — IOHEXOL 350 MG/ML SOLN
INTRAVENOUS | Status: DC | PRN
  Administered 2015-06-16: 115 mL via INTRACARDIAC

## 2015-06-16 MED ORDER — LIDOCAINE HCL (PF) 1 % IJ SOLN
INTRAMUSCULAR | Status: DC | PRN
  Administered 2015-06-16: 15:00:00

## 2015-06-16 MED ORDER — DOCUSATE SODIUM 100 MG PO CAPS
100.0000 mg | ORAL_CAPSULE | Freq: Two times a day (BID) | ORAL | Status: DC
  Administered 2015-06-17 – 2015-06-18 (×3): 100 mg via ORAL
  Filled 2015-06-16 (×4): qty 1

## 2015-06-16 MED ORDER — TICAGRELOR 90 MG PO TABS
ORAL_TABLET | ORAL | Status: AC
  Filled 2015-06-16: qty 1

## 2015-06-16 MED ORDER — TIROFIBAN HCL IV 12.5 MG/250 ML
INTRAVENOUS | Status: AC
  Filled 2015-06-16: qty 250

## 2015-06-16 MED ORDER — ASPIRIN 81 MG PO TABS: 81.0000 mg | ORAL_TABLET | Freq: Every day | ORAL | Status: DC

## 2015-06-16 MED ORDER — VERAPAMIL HCL 2.5 MG/ML IV SOLN
INTRAVENOUS | Status: AC
  Filled 2015-06-16: qty 2

## 2015-06-16 MED ORDER — SODIUM CHLORIDE 0.9 % IV SOLN
250.0000 mL | INTRAVENOUS | Status: DC | PRN
Start: 2015-06-16 — End: 2015-06-18

## 2015-06-16 MED ORDER — NYSTATIN-TRIAMCINOLONE 100000-0.1 UNIT/GM-% EX CREA
TOPICAL_CREAM | Freq: Two times a day (BID) | CUTANEOUS | Status: DC
  Administered 2015-06-16: 22:00:00 via TOPICAL
  Administered 2015-06-17: 1 via TOPICAL
  Filled 2015-06-16 (×2): qty 15

## 2015-06-16 MED ORDER — HEPARIN (PORCINE) IN NACL 2-0.9 UNIT/ML-% IJ SOLN
INTRAMUSCULAR | Status: DC | PRN
  Administered 2015-06-16: 14:00:00 via INTRA_ARTERIAL

## 2015-06-16 MED ORDER — TIROFIBAN HCL IV 12.5 MG/250 ML
INTRAVENOUS | Status: DC | PRN
  Administered 2015-06-16: .15 ug/kg/min via INTRAVENOUS

## 2015-06-16 MED ORDER — FERROUS SULFATE 325 (65 FE) MG PO TABS
325.0000 mg | ORAL_TABLET | Freq: Every day | ORAL | Status: DC
  Administered 2015-06-17 – 2015-06-18 (×2): 325 mg via ORAL
  Filled 2015-06-16 (×2): qty 1

## 2015-06-16 MED ORDER — FENTANYL CITRATE (PF) 100 MCG/2ML IJ SOLN
INTRAMUSCULAR | Status: AC
  Filled 2015-06-16: qty 2

## 2015-06-16 MED ORDER — HEPARIN SODIUM (PORCINE) 1000 UNIT/ML IJ SOLN
INTRAMUSCULAR | Status: DC | PRN
Start: 2015-06-16 — End: 2015-06-16
  Administered 2015-06-16 (×2): 4000 [IU] via INTRAVENOUS

## 2015-06-16 MED ORDER — ASPIRIN 81 MG PO CHEW
81.0000 mg | CHEWABLE_TABLET | Freq: Every day | ORAL | Status: DC
  Administered 2015-06-16 – 2015-06-18 (×3): 81 mg via ORAL
  Filled 2015-06-16 (×3): qty 1

## 2015-06-16 MED ORDER — METOPROLOL TARTRATE 12.5 MG HALF TABLET
12.5000 mg | ORAL_TABLET | Freq: Two times a day (BID) | ORAL | Status: DC
  Administered 2015-06-16 – 2015-06-18 (×4): 12.5 mg via ORAL
  Filled 2015-06-16 (×4): qty 1

## 2015-06-16 MED ORDER — FENTANYL CITRATE (PF) 100 MCG/2ML IJ SOLN
INTRAMUSCULAR | Status: DC | PRN
  Administered 2015-06-16: 25 ug via INTRAVENOUS

## 2015-06-16 MED ORDER — SODIUM CHLORIDE 0.9 % IV SOLN
INTRAVENOUS | Status: DC | PRN
  Administered 2015-06-16: 81 mL/h via INTRAVENOUS

## 2015-06-16 MED ORDER — MIDAZOLAM HCL 2 MG/2ML IJ SOLN
INTRAMUSCULAR | Status: AC
  Filled 2015-06-16: qty 2

## 2015-06-16 MED ORDER — HEPARIN SODIUM (PORCINE) 1000 UNIT/ML IJ SOLN
INTRAMUSCULAR | Status: AC
  Filled 2015-06-16: qty 1

## 2015-06-16 MED ORDER — PANTOPRAZOLE SODIUM 40 MG PO TBEC
40.0000 mg | DELAYED_RELEASE_TABLET | Freq: Every day | ORAL | Status: DC
Start: 2015-06-16 — End: 2015-06-18
  Administered 2015-06-16 – 2015-06-18 (×3): 40 mg via ORAL
  Filled 2015-06-16 (×3): qty 1

## 2015-06-16 MED ORDER — LIDOCAINE HCL (PF) 1 % IJ SOLN
INTRAMUSCULAR | Status: AC
  Filled 2015-06-16: qty 30

## 2015-06-16 MED ORDER — TIROFIBAN HCL IN NACL 5-0.9 MG/100ML-% IV SOLN: 0.1500 ug/kg/min | INTRAVENOUS | Status: AC

## 2015-06-16 MED ORDER — TICAGRELOR 90 MG PO TABS
ORAL_TABLET | ORAL | Status: AC
Start: 2015-06-16 — End: 2015-06-16
  Filled 2015-06-16: qty 2

## 2015-06-16 MED ORDER — SODIUM CHLORIDE 0.9 % IJ SOLN
3.0000 mL | Freq: Two times a day (BID) | INTRAMUSCULAR | Status: DC
  Administered 2015-06-16 – 2015-06-17 (×3): 3 mL via INTRAVENOUS

## 2015-06-16 MED ORDER — MIDAZOLAM HCL 2 MG/2ML IJ SOLN
INTRAMUSCULAR | Status: DC | PRN
  Administered 2015-06-16: 2 mg via INTRAVENOUS

## 2015-06-16 MED ORDER — TICAGRELOR 90 MG PO TABS
90.0000 mg | ORAL_TABLET | Freq: Two times a day (BID) | ORAL | Status: DC
  Administered 2015-06-17 – 2015-06-18 (×3): 90 mg via ORAL
  Filled 2015-06-16 (×3): qty 1

## 2015-06-16 MED ORDER — SODIUM CHLORIDE 0.9 % IJ SOLN: 3.0000 mL | INTRAMUSCULAR | Status: DC | PRN

## 2015-06-16 MED ORDER — SODIUM CHLORIDE 0.9 % IV SOLN
INTRAVENOUS | Status: AC
Start: 2015-06-16 — End: 2015-06-16

## 2015-06-16 MED ORDER — ACETAMINOPHEN 325 MG PO TABS: 650.0000 mg | ORAL_TABLET | ORAL | Status: DC | PRN

## 2015-06-16 MED ORDER — EZETIMIBE-SIMVASTATIN 10-40 MG PO TABS: 1.0000 | ORAL_TABLET | ORAL | Status: DC

## 2015-06-16 MED ORDER — TICAGRELOR 90 MG PO TABS
ORAL_TABLET | ORAL | Status: DC | PRN
  Administered 2015-06-16: 180 mg via ORAL

## 2015-06-16 MED ORDER — ONDANSETRON HCL 4 MG/2ML IJ SOLN: 4.0000 mg | Freq: Four times a day (QID) | INTRAMUSCULAR | Status: DC | PRN

## 2015-06-16 MED ORDER — NITROGLYCERIN 0.4 MG SL SUBL: 0.4000 mg | SUBLINGUAL_TABLET | SUBLINGUAL | Status: DC | PRN

## 2015-06-16 SURGICAL SUPPLY — 23 items
BALLN EUPHORA RX 2.5X15 (BALLOONS) ×2
BALLN ~~LOC~~ EUPHORA RX 3.5X12 (BALLOONS) ×2
BALLOON EUPHORA RX 2.5X15 (BALLOONS) ×1 IMPLANT
BALLOON ~~LOC~~ EUPHORA RX 3.5X12 (BALLOONS) ×1 IMPLANT
CATH DIAG 6FR JR4 (CATHETERS) ×2 IMPLANT
CATH EXTRAC PRONTO 5.5F 138CM (CATHETERS) ×2 IMPLANT
CATH INFINITI 5FR ANG PIGTAIL (CATHETERS) ×2 IMPLANT
CATH INFINITI 6F FL3.5 (CATHETERS) ×2 IMPLANT
CATH INFINITI JR4 5F (CATHETERS) ×2 IMPLANT
DEVICE RAD COMP TR BAND LRG (VASCULAR PRODUCTS) ×2 IMPLANT
GLIDESHEATH SLEND SS 6F .021 (SHEATH) ×2 IMPLANT
GUIDE CATH RUNWAY 6FR CLS3 (CATHETERS) ×2 IMPLANT
KIT ENCORE 26 ADVANTAGE (KITS) ×2 IMPLANT
KIT HEART LEFT (KITS) ×2 IMPLANT
PACK CARDIAC CATHETERIZATION (CUSTOM PROCEDURE TRAY) ×2 IMPLANT
STENT XIENCE ALPINE RX 3.0X15 (Permanent Stent) ×2 IMPLANT
SYR MEDRAD MARK V 150ML (SYRINGE) ×2 IMPLANT
TRANSDUCER W/STOPCOCK (MISCELLANEOUS) ×2 IMPLANT
TUBING CIL FLEX 10 FLL-RA (TUBING) ×2 IMPLANT
TUBING CONTRAST HIGH PRESS 20 (MISCELLANEOUS) ×2 IMPLANT
VALVE GUARDIAN II ~~LOC~~ HEMO (MISCELLANEOUS) ×2 IMPLANT
WIRE ASAHI PROWATER 180CM (WIRE) ×2 IMPLANT
WIRE SAFE-T 1.5MM-J .035X260CM (WIRE) ×2 IMPLANT

## 2015-06-16 NOTE — H&P (Signed)
Patient ID: [name] MRN: [id number], DOB/AGE: Jun 09, 1945   Admit date: 06/16/2015   Primary Physician: [name], MD Primary Cardiologist: Dr. Ellyn Hack   [id number] [name] is [name] 70 y.o. male with past medical history of CAD (s/p 2 overlapping DES to mRCA in 11/2012), HTN, HLD, CLL, and BPH who presented to [facility] on 06/16/2015 as [name] CODE STEMI.   The patient reports he was shoveling snow throughout the day yesterday and developed an episode of left-sided chest pain last night which started at 11:00. The pain was intermittent for several minutes and finally relieved fully around 0100, allowing him to go to sleep. He did not take any SL NTG during this episode.   This afternoon, around 1230, he developed increased left-sided chest pain associated with nausea and diaphoresis. This prompted him to call EMS. His pain was an 8/10 initially, but relieved with 324mg  ASA and 1 SL NTG. His initial EKG showed ST elevation in Leads III, AVF, V5, and V6 with reciprocal depression in AVL. CODE STEMI was called and he was transported to [facility] for urgent catheterization.   Upon arrival to the cath lab, his chest pain was fully resolved. He denied any current dyspnea, diaphoresis, or nausea. He reported good compliance with his medications including Plavix, Pantoprazole, Iron supplementation, HCTZ, Cyclobenzaprine, and Colace.   He was last seen by Dr. Ellyn Hack in 11/2014 and reported walking [name] half-mile per day with minimal dyspnea. He denied any chest pain at that time. He denies having any cardiac procedures at other hospitals since his stent placement    Home Medications  Prior to Admission medications   Medication Sig Start Date End Date Taking? Authorizing Provider  ARTIFICIAL TEAR SOLUTION OP Apply 1 drop to eye as needed (dry eyes).    Historical Provider, MD  aspirin 81 MG tablet Take 81 mg by mouth daily.    Historical Provider, MD  Cholecalciferol (D3-1000) 1000  UNITS capsule Take 1,000 Units by mouth 2 (two) times [name] week.      Historical Provider, MD  clopidogrel (PLAVIX) 75 MG tablet Take 1 tablet (75 mg total) by mouth daily with breakfast. 06/08/13   Marjean Donna, MD  Cyanocobalamin (VITAMIN B-12) 2500 MCG SUBL Place 1 tablet under the tongue once [name] week.     Historical Provider, MD  docusate sodium (COLACE) 100 MG capsule Take 100 mg by mouth 2 (two) times daily.    Historical Provider, MD  ezetimibe-simvastatin (VYTORIN) 10-40 MG per tablet Take 1 tablet by mouth every 30 (thirty) days.     Historical Provider, MD  ferrous sulfate 325 (65 FE) MG tablet Take 325 mg by mouth daily with breakfast.    Historical Provider, MD  hydrochlorothiazide (HYDRODIURIL) 25 MG tablet Take 1 tablet (25 mg total) by mouth daily. 03/12/13   Leonie Man, MD  L-Arginine 500 MG CAPS Take 1 capsule by mouth every 30 (thirty) days.    Historical Provider, MD  loperamide (IMODIUM [name]-D) 2 MG tablet Take 2 mg by mouth 4 (four) times daily as needed for diarrhea or loose stools.    Historical Provider, MD  Menthol-Methyl Salicylate (MUSCLE RUB) 10-15 % CREA Apply 1 application topically as needed (for upper back pain).    Historical Provider, MD  nitroGLYCERIN (NITROSTAT) 0.4 MG SL tablet Place 1 tablet (0.4 mg total) under the tongue every 5 (five) minutes x 3 doses as needed for chest pain. 03/24/14   Leonie Man,  MD  nystatin-triamcinolone (MYCOLOG II) cream Apply topically 2 (two) times daily. to affected area 07/07/14   Historical Provider, MD  OVER THE COUNTER MEDICATION 1 tablet. Beta Prostate Herbal supplement for prostate health.  Takes 3 times weekly    Historical Provider, MD  pantoprazole (PROTONIX) 40 MG tablet Take 1 tablet (40 mg total) by mouth daily. 06/08/13   Marjean Donna, MD  pyridoxine (B-6) 100 MG tablet Take 100 mg by mouth daily.    Historical Provider, MD  traMADol (ULTRAM) 50 MG tablet Take by mouth every 6 (six) hours as needed.    Historical Provider,  MD  vitamin C (ASCORBIC ACID) 500 MG tablet Take 500 mg by mouth daily.    Historical Provider, MD    Allergies No Known Allergies  Past Medical History Past Medical History  Diagnosis Date  . Diverticulitis   . Hypercholesteremia   . BPH (benign prostatic hyperplasia) 04/08/2011  . Gallstone   . Depression   . Headache(784.0)   . GERD (gastroesophageal reflux disease)   . H/O Non-ST elevation MI (NSTEMI) 11/23/12    RCA lesion - PCI with DES; moderate LAD disease  . CAD S/P percutaneous coronary angioplasty 11/23/12    2 overlapping mRCA lesions - Xience Xpedition DES 2.75 mm x 18 mm (3.0 mm)  . PTSD (post-traumatic stress disorder)     With Depression - since MI  . CLL (chronic lymphocytic leukemia) ([place]) 04/08/2011  . STEMI (ST elevation myocardial infarction) ([place]) 06/16/2015     Surgical History Past Surgical History  Procedure Laterality Date  . Appendectomy  [id number]  . Colonoscopy  2008    [id number] diverticula, diminutive polyp in the cecum, s/p bx Normal rectum. Tubular adenoma  . Colonoscopy N/[name] 10/17/2012    Dr. Gala Romney: colonic diverticulosis, tubular adenoma, surveillance due May 2019  . Cardiac catheterization  11/24/12    NSTEMI: Culprit = mid RCA 95% & 75%; LAD ~40-50%, Small RI ~60%; EF ~60%, mild inf-basal HK  . Coronary stent placement  11/24/12    Mid RCA -- Xience eXp - 2.75 mm x 18 mm DES (3.66mm) , Dr. Ellyn Hack  . Left heart catheterization with coronary angiogram N/[name] 11/23/2012    Procedure: LEFT HEART CATHETERIZATION WITH CORONARY ANGIOGRAM;  Surgeon: Leonie Man, MD;  Location: [facility] CATH LAB;  Service: Cardiovascular;  Laterality: N/[name];     Family History Family History  Problem Relation Age of Onset  . Diabetes Mother   . Cancer Father   . Colon cancer Neg Hx     Social History Social History   Social History  . Marital Status: Married    Spouse Name: N/[name]  . Number of Children: N/[name]  . Years of Education: N/[name]   Occupational History  .  Not on file.   Social History Main Topics  . Smoking status: Former Smoker -- 0.25 packs/day for 2 years    Types: Cigarettes    Quit date: 06/07/1963  . Smokeless tobacco: Never Used  . Alcohol Use: No     Comment: Occasionally drinks red wine  . Drug Use: No  . Sexual Activity: Not on file   Other Topics Concern  . Not on file   Social History Narrative   Currently in Cardiac Rehabilitation   "former smoker". Does not drink EtOH   Married.     Review of Systems General:  No chills, fever, night sweats or weight changes. Positive for diaphoresis. Cardiovascular:  No edema, orthopnea, palpitations, paroxysmal nocturnal dyspnea.  Positive for chest pain and dyspnea on exertion. Dermatological: No rash, lesions/masses Respiratory: No cough, dyspnea Urologic: No hematuria, dysuria Abdominal:   No vomiting, diarrhea, bright red blood per rectum, melena, or hematemesis. Positive for nausea. Neurologic:  No visual changes, wkns, changes in mental status. All other systems reviewed and are otherwise negative except as noted above.   Physical Exam: SpO2 95 %.  General: Well developed, well nourished,male in no acute distress. Head: Normocephalic, atraumatic, sclera non-icteric, no xanthomas, nares are without discharge. Dentition:  Neck: No carotid bruits. JVD not elevated.  Lungs: Respirations regular and unlabored, without wheezes or rales.  Heart: Regular rate and rhythm. No S3 or S4.  No murmur, no rubs, or gallops appreciated. Abdomen: Soft, non-tender, non-distended with normoactive bowel sounds. No hepatomegaly. No rebound/guarding. No obvious abdominal masses. Msk:  Strength and tone appear normal for age. No joint deformities or effusions. Extremities: No clubbing or cyanosis. No edema.  Distal pedal pulses are 2+ bilaterally. Neuro: Alert and oriented X 3. Moves all extremities spontaneously. No focal deficits noted. Psych:  Responds to questions appropriately with [name]  normal affect. Skin: No rashes or lesions noted   Labs: Lab Results  Component Value Date   WBC 49.0* 06/16/2015   HGB 15.0 06/16/2015   HCT 48.0 06/16/2015   MCV 96.8 06/16/2015   PLT 169 06/16/2015   Troponin (Point of Care Test): Lab Results  Component Value Date   CHOL 140 10/08/2013   HDL 31* 10/08/2013   LDLCALC 64 10/08/2013   TRIG 226* 10/08/2013     ECG: ST elevation in Leads III, AVF, V5, and V6 with reciprocal depression in AVL    ASSESSMENT AND PLAN:  1. STEMI (ST elevation myocardial infarction) ([place]) - history of CAD (s/p DES to [facility] in 2014). Has onset of chest pain last night following shoveling snow during the day. His pain represented today associated with nausea, diaphoresis, and dyspnea. - Initial EKG showed ST elevation in Leads III, AVF, V5, and V6 with reciprocal depression in AVL. Taken urgently to the cardiac catheterization lab for likely PCI as [name] CODE STEMI. - will need to cycle troponin values - consider restarting BB. Continue ASA and Plavix unless switching to Brilinta.  2. Dyslipidemia - will check Lipid panel - unclear if patient is still on statin. Will need to resume high-dose statin.  3. Benign essential HTN - continue to monitor - on HCTZ as outpatient - consider switching to ACE-I pending creatinine results.  4. CLL - followed by Dr. Whitney Muse in the outpatient setting.   Signed, Erma Heritage, PA-C 06/16/2015, 2:55 PM Pager: [phone]   I have examined the patient and reviewed assessment and plan and discussed with patient.  Agree with above as stated.  Plan on switching to Brilinta from Plavix.  He has been compliant with his DAPT.  Further plans based on the result of his cath.  RRR, no significant murmurs on exam.  No evidence of CHF.   Rashidah Belleville S.

## 2015-06-17 ENCOUNTER — Encounter (HOSPITAL_COMMUNITY): Payer: Self-pay | Admitting: Interventional Cardiology

## 2015-06-17 DIAGNOSIS — C9110 Chronic lymphocytic leukemia of B-cell type not having achieved remission: Secondary | ICD-10-CM

## 2015-06-17 LAB — BASIC METABOLIC PANEL
Anion gap: 8 (ref 5–15)
BUN: 11 mg/dL (ref 6–20)
CO2: 22 mmol/L (ref 22–32)
Calcium: 8.6 mg/dL — ABNORMAL LOW (ref 8.9–10.3)
Chloride: 108 mmol/L (ref 101–111)
Creatinine, Ser: 1.13 mg/dL (ref 0.61–1.24)
GFR calc Af Amer: >60 mL/min (ref >60)
GFR calc non Af Amer: >60 mL/min (ref >60)
Glucose, Bld: 149 mg/dL — ABNORMAL HIGH (ref 65–99)
Potassium: 5.1 mmol/L (ref 3.5–5.1)
Sodium: 138 mmol/L (ref 135–145)

## 2015-06-17 LAB — CBC
HCT: 48.2 % (ref 39.0–52.0)
Hemoglobin: 15.3 g/dL (ref 13.0–17.0)
MCH: 30.5 pg (ref 26.0–34.0)
MCHC: 31.7 g/dL (ref 30.0–36.0)
MCV: 96.2 fL (ref 78.0–100.0)
Platelets: 162 10*3/uL (ref 150–400)
RBC: 5.01 MIL/uL (ref 4.22–5.81)
RDW: 14.8 % (ref 11.5–15.5)
WBC: 47.8 10*3/uL — ABNORMAL HIGH (ref 4.0–10.5)

## 2015-06-17 LAB — PATHOLOGIST SMEAR REVIEW

## 2015-06-17 MED ORDER — ROSUVASTATIN CALCIUM 40 MG PO TABS
40.0000 mg | ORAL_TABLET | Freq: Every day | ORAL | Status: DC
  Administered 2015-06-17: 40 mg via ORAL
  Filled 2015-06-17: qty 1

## 2015-06-17 MED ORDER — LISINOPRIL 2.5 MG PO TABS
2.5000 mg | ORAL_TABLET | Freq: Every day | ORAL | Status: DC
  Administered 2015-06-17 – 2015-06-18 (×2): 2.5 mg via ORAL
  Filled 2015-06-17 (×2): qty 1

## 2015-06-17 NOTE — Progress Notes (Signed)
CARDIAC REHAB PHASE I   PRE:  Rate/Rhythm: 63 SR  BP:  Sitting: 117/75        SaO2: 97 RA  MODE:  Ambulation: 550 ft   POST:  Rate/Rhythm: 78 SR  BP:  Sitting: 127/73         SaO2: 96 RA  Pt ambulated 550 ft on RA, hand held assist, slow, steady gait, tolerated well. Pt denies CP, dizziness, DOE, declined rest stop. Pt's wife went home, will not return until after 4pm today. Pt states he would prefer to have wife present for education. Left MI book, heart healthy diet handout and phase 2 brochure ([place]) at bedside for pt to review. Pt verbalized understanding. Pt to recliner after walk, call bell within reach. Will follow-up tomorrow.   [id number] Lenna Sciara, RN, BSN 06/17/2015 12:04 PM

## 2015-06-17 NOTE — Progress Notes (Signed)
Patient Name: [name] Date of Encounter: 06/17/2015  Principal Problem:   STEMI (ST elevation myocardial infarction) ([place]) Active Problems:   CLL (chronic lymphocytic leukemia) (HCC)   Dyslipidemia, goal LDL below 70 - on Vytorin; well controlled   CAD S/P percutaneous coronary angioplasty -- mid RCA: Xience expedition 2.75 mm x 18 mm (3.1 mm) in 2014   Benign essential HTN   Acute MI, inferolateral wall, initial episode of care [facility])   Acute inferolateral myocardial infarction ([place])   Length of Stay: 1  SUBJECTIVE  No angina or dyspnea. No problems at radial artery acces site  CURRENT MEDS . aspirin  81 mg Oral Daily  . docusate sodium  100 mg Oral BID  . ferrous sulfate  325 mg Oral Q breakfast  . metoprolol tartrate  12.5 mg Oral BID  . nystatin-triamcinolone   Topical BID  . pantoprazole  40 mg Oral Daily  . sodium chloride  3 mL Intravenous Q12H  . ticagrelor  90 mg Oral BID    OBJECTIVE   Intake/Output Summary (Last 24 hours) at 06/17/15 0906 Last data filed at 06/17/15 0400  Gross per 24 hour  Intake    310 ml  Output    450 ml  Net   -140 ml   Filed Weights   06/16/15 1700  Weight: 178 lb 9.2 oz (81 kg)    PHYSICAL EXAM Filed Vitals:   06/17/15 0400 06/17/15 0500 06/17/15 0600 06/17/15 0700  BP: 125/78 123/65 122/67 116/67  Pulse: 63 59 59 62  Temp: 98 F (36.7 C)   97.3 F (36.3 C)  TempSrc: Oral   Oral  Resp: 21 18 24 24   Height:      Weight:      SpO2: 96% 96% 95% 94%   General: Alert, oriented x3, no distress Head: no evidence of trauma, PERRL, EOMI, no exophtalmos or lid lag, no myxedema, no xanthelasma; normal ears, nose and oropharynx Neck: normal jugular venous pulsations and no hepatojugular reflux; brisk carotid pulses without delay and no carotid bruits Chest: clear to auscultation, no signs of consolidation by percussion or palpation, normal fremitus, symmetrical and full respiratory excursions Cardiovascular: normal position  and quality of the apical impulse, regular rhythm, normal first and second heart sounds, no rubs, +ve S4, no murmur Abdomen: no tenderness or distention, no masses by palpation, no abnormal pulsatility or arterial bruits, normal bowel sounds, no hepatosplenomegaly Extremities: no clubbing, cyanosis or edema; 2+ radial, ulnar and brachial pulses bilaterally; 2+ right femoral, posterior tibial and dorsalis pedis pulses; 2+ left femoral, posterior tibial and dorsalis pedis pulses; no subclavian or femoral bruits Neurological: grossly nonfocal  LABS  CBC  Recent Labs  06/16/15 1425 06/16/15 2209 06/17/15 0243  WBC 49.0*  --  47.8*  NEUTROABS 3.9  --   --   HGB 15.0  --  15.3  HCT 48.0  --  48.2  MCV 96.8  --  96.2  PLT 169 172 0000000   Basic Metabolic Panel  Recent Labs  06/16/15 1425 06/17/15 0243  NA 140 138  K 5.1 5.1  CL 107 108  CO2 24 22  GLUCOSE 128* 149*  BUN 12 11  CREATININE 1.29* 1.13  CALCIUM 8.7* 8.6*   Cardiac Enzymes  Recent Labs  06/16/15 1425  TROPONINI 1.50*   Fasting Lipid Panel  Recent Labs  06/16/15 1425  CHOL 145  HDL 29*  LDLCALC 92  TRIG 122  CHOLHDL 5.0    Radiology  Studies Imaging results have been reviewed and No results found.  TELE NSR  ECG Evolving lateral wall MI changes, ST changes resolved, persistent symmetrical T wave inversion.  ASSESSMENT AND PLAN   1. CAD s/p acute lateral wall STEMI due to LCX thrombotic occlusion, treated with DES,  remote DES-RCA x2 - angina free, no malignant arrhythmia or CHF signs/sxs - echo - cardiac rehab eval - on low dose metoprolol, try to add low dose ACEi (limited by BP) - ASA 81 mg + Brilinta 12 months  2. Hyperlipidemia - target LDL-C <70; disease progression despite LDL <100 on Vytorin. Switch to high dose rosuvastatin and add zetia or PCSK9 inhibitors if still not at target  3. CLL - followed by Dr. Whitney Muse  4. 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

## 2015-06-17 NOTE — Care Management Note (Addendum)
Case Management Note  Patient Details  Name: JAHI RISTER MRN: [id number] Date of Birth: 29-Jul-1945  Subjective/Objective:       Adm w mi             Action/Plan: lives w fie, pcp dr Luster Landsberg mcginnis   Expected Discharge Date:                  Expected Discharge Plan:     In-House Referral:     Discharge planning Services     Post Acute Care Choice:    Choice offered to:     DME Arranged:    DME Agency:     HH Arranged:    [place] Agency:     Status of Service:     Medicare Important Message Given:    Date Medicare IM Given:    Medicare IM give by:    Date Additional Medicare IM Given:    Additional Medicare Important Message give by:     If discussed at Long Length of Stay Meetings, dates discussed:    gave pt 30day free brilinta card., per insurance check no auth required, copay $0  Lacretia Leigh, RN 06/17/2015, 8:32 AM

## 2015-06-18 ENCOUNTER — Ambulatory Visit (HOSPITAL_COMMUNITY): Payer: Medicare Other | Admitting: Hematology & Oncology

## 2015-06-18 ENCOUNTER — Telehealth: Payer: Self-pay | Admitting: Physician Assistant

## 2015-06-18 ENCOUNTER — Other Ambulatory Visit: Payer: Self-pay | Admitting: Physician Assistant

## 2015-06-18 ENCOUNTER — Ambulatory Visit (HOSPITAL_COMMUNITY): Payer: Medicare HMO

## 2015-06-18 ENCOUNTER — Other Ambulatory Visit (HOSPITAL_COMMUNITY): Payer: Medicare Other

## 2015-06-18 ENCOUNTER — Encounter (HOSPITAL_COMMUNITY): Payer: Self-pay | Admitting: Cardiology

## 2015-06-18 DIAGNOSIS — I10 Essential (primary) hypertension: Secondary | ICD-10-CM

## 2015-06-18 DIAGNOSIS — I255 Ischemic cardiomyopathy: Secondary | ICD-10-CM | POA: Diagnosis present

## 2015-06-18 DIAGNOSIS — I519 Heart disease, unspecified: Secondary | ICD-10-CM

## 2015-06-18 DIAGNOSIS — I213 ST elevation (STEMI) myocardial infarction of unspecified site: Secondary | ICD-10-CM

## 2015-06-18 LAB — CBC
HCT: 47.9 % (ref 39.0–52.0)
Hemoglobin: 15.1 g/dL (ref 13.0–17.0)
MCH: 29.8 pg (ref 26.0–34.0)
MCHC: 31.5 g/dL (ref 30.0–36.0)
MCV: 94.5 fL (ref 78.0–100.0)
Platelets: 167 10*3/uL (ref 150–400)
RBC: 5.07 MIL/uL (ref 4.22–5.81)
RDW: 14.6 % (ref 11.5–15.5)
WBC: 48 10*3/uL — ABNORMAL HIGH (ref 4.0–10.5)

## 2015-06-18 MED ORDER — TICAGRELOR 90 MG PO TABS: 90.0000 mg | ORAL_TABLET | Freq: Two times a day (BID) | ORAL | Status: DC

## 2015-06-18 MED ORDER — ROSUVASTATIN CALCIUM 40 MG PO TABS: 40.0000 mg | ORAL_TABLET | Freq: Every day | ORAL | Status: DC

## 2015-06-18 MED ORDER — LISINOPRIL 2.5 MG PO TABS: 2.5000 mg | ORAL_TABLET | Freq: Every day | ORAL | Status: DC

## 2015-06-18 MED ORDER — METOPROLOL SUCCINATE ER 25 MG PO TB24: 25.0000 mg | ORAL_TABLET | Freq: Every day | ORAL | Status: DC

## 2015-06-18 NOTE — Progress Notes (Signed)
Changed his appt location to [place] as patient lives in [place]. Changed pharmacy to [facility].  Post folley removal he does have some hematuria which likely will resolve. I have instructed patient and his wife to see PCP for potentially urological eval if hematuria does not resolve in 3 days. I have also given them a paper Rx to obtain CBC at [facility] on 06/22/2015.  Hilbert Corrigan PA Pager: [phone]

## 2015-06-18 NOTE — Progress Notes (Addendum)
CARDIAC REHAB PHASE I   PRE:  Rate/Rhythm: 76 SR  BP:  Sitting: 116/64        SaO2: 96 RA  MODE:  Ambulation: 550 ft   POST:  Rate/Rhythm: 88 SR  BP:  Sitting: 126/58         SaO2: 98 RA  Pt ambulated 550 ft on RA, handheld assist, steady gait, tolerated well.  Pt denies cp, dizziness, DOE, declined rest stop. Completed MI/stent education with pt and wife at bedside.  Reviewed risk factors, anti-platelet therapy, stent card, activity restrictions, ntg, exercise, heart healthy diet, portion control, and phase 2 cardiac rehab. Pt and wife verbalized understanding, receptive to education. Pt agrees to phase 2 cardiac rehab referral, will send to [place] per pt request. Pt wife states they received a brilinta card, are not sure how they will get the medication after the 30 day free period. Will notify case manager to follow-up to ensure pt has plan for obtaining brilinta. Pt to recliner after walk, call bell within reach. Will follow if pt does not discharge today.    [id number] Lenna Sciara, RN, BSN 06/18/2015 11:19 AM

## 2015-06-18 NOTE — Telephone Encounter (Signed)
New problem    Pt has TOC w/[facility] 1.19.17 per [facility] calling.

## 2015-06-18 NOTE — Progress Notes (Signed)
  Echocardiogram 2D Echocardiogram has been performed.  [name] 06/18/2015, 11:50 AM

## 2015-06-18 NOTE — Discharge Summary (Signed)
Discharge Summary   Patient ID: [name],  MRN: [id number], DOB/AGE: 1946-02-05 70 y.o.  Admit date: 06/16/2015 Discharge date: 06/18/2015  Primary Care Provider: [name] Primary Cardiologist: Dr. Ellyn Hack  Discharge Diagnoses Principal Problem:   STEMI (ST elevation myocardial infarction) [facility]) Active Problems:   CLL (chronic lymphocytic leukemia) (HCC)   Dyslipidemia, goal LDL below 70 - on Vytorin; well controlled   CAD S/P percutaneous coronary angioplasty -- mid RCA: Xience expedition 2.75 mm x 18 mm (3.1 mm) in 2014   Benign essential HTN   Acute MI, inferolateral wall, initial episode of care [facility])   Ischemic cardiomyopathy   Allergies No Known Allergies  Procedures  Echocardiogram 06/18/2015 LV EF: 35% -  40%  ------------------------------------------------------------------- Indications:   MI - acute 410.91.  ------------------------------------------------------------------- History:  PMH: PTSD. Chronic headache. Coronary artery disease. Risk factors: Dyslipidemia.  ------------------------------------------------------------------- Study Conclusions  - Left ventricle: There is hypokinesis of the entire inferior, and akinesis of the basal and mid inferolateral, anterolateral and distal lateral walls. The cavity size was normal. There was mild concentric hypertrophy. Systolic function was moderately reduced. The estimated ejection fraction was in the range of 35% to 40%. Wall motion was normal; there were no regional wall motion abnormalities. Doppler parameters are consistent with abnormal left ventricular relaxation (grade 1 diastolic dysfunction). There was no evidence of elevated ventricular filling pressure by Doppler parameters. - Aortic valve: Trileaflet; normal thickness leaflets. - Aortic root: The aortic root was normal in size. - Right ventricle: The cavity size was normal. Wall thickness was normal.  Systolic function was normal. - Tricuspid valve: There was trivial regurgitation. - Pulmonary arteries: The main pulmonary artery was normal-sized. Systolic pressure was within the normal range. - Inferior vena cava: The vessel was normal in size. - Pericardium, extracardiac: There was no pericardial effusion.  Impressions:  - Wall motion abnormalities are possibly the result of myocardial stunning, an echocardiogram should be repeated in 6 weeks.    Cardiac catheterization 06/16/2015 Conclusion     Patent stent in the RCA  Dist Cx thrombotic lesion, 99% stenosed which was the culprit for today's presentation. Post intervention with aspiration thrombectomy and a 3.0 x 15 Xience drug-eluting stent, postdilated to 3.6 mm, there is a 0% residual stenosis.  There is moderate left ventricular systolic dysfunction.  Continue dual antiplatelet therapy for at least a year. Will change clopidogrel to Brilinta 90 mg twice a day. Continue IV tirofiban for 2 hours postprocedure. He'll need aggressive medical therapy for his LV dysfunction. Continue statin and beta blocker as well. He'll be watched in the ICU.      Hospital Course  The patient is a 70 year old male with past medical history of CAD (s/p 2 overlapping DES to [place] in 11/2012), HTN, HLD, CLL, and BPH who presented to [facility]16[facility]2015 as a CODE STEMI. Initial EKG showed ST elevation in leads III, aVF, V5-V6 with reciprocal depression in AVL. Cardiac catheterization showed 99% distal left circumflex thrombotic lesion treated with 3.0 5:15 millimeter she owns DES postdilated to 3.6 mm, EF 35-45%. His Plavix was changed to Brilinta 90 mg twice a day. He was continued on IV Tirofiban for 2 hours postprocedure. He was also started on high-dose rosuvastatin. Lipid panel obtained on 1/10 showed cholesterol 145, triglyceride 122, HDL 29, his LDL 92. If we cannot get his cholesterol profile in 2 goal target range, plan to add  Zetia or PCSK 9 inhibitor at a later time. He will need  a repeat lipid test and LFT in 3 months. He has chronic leg edema presumably secondary to peripheral venous insufficiency. He was seen the morning of 06/18/2015, at which time he was doing well without significant discomfort. His white blood cell count is significantly elevated, however that is due to CLL, there is no sign of infection. He is deemed stable for discharge from cardiology perspective. I have arranged seven-day transition of care follow-up in our [place] office. Echocardiogram was obtained prior to discharge which showed EF 35-40%, hypokinesis of the entire inferior, akinesis of basal and mid inferolateral, anterolateral, and distal lateral wall, grade 1 diastolic dysfunction. We have arranged 6 weeks outpatient echocardiogram to reassess EF. I have discussed with M.D., will changes metoprolol tartrate to Toprol-XL 25 mg daily given his LV dysfunction. We have also to give him low-dose ACE inhibitor. Unable to up titrate ACE inhibitor at this time due to his borderline blood pressure.  He does have a history of BPH, prior to discharge we did a voiding trial  after removal of Foley catheter, he urinated 200 mL without difficulty.     Discharge Vitals Blood pressure 119/63, pulse 71, temperature 97.7 F (36.5 C), temperature source Oral, resp. rate 17, height 5\' 9"  (1.753 m), weight 178 lb 9.2 oz (81 kg), SpO2 100 %.  Filed Weights   06/16/15 1700  Weight: 178 lb 9.2 oz (81 kg)    Labs  CBC  Recent Labs  06/16/15 1425  06/17/15 0243 06/18/15 0347  WBC 49.0*  --  47.8* 48.0*  NEUTROABS 3.9  --   --   --   HGB 15.0  --  15.3 15.1  HCT 48.0  --  48.2 47.9  MCV 96.8  --  96.2 94.5  PLT 169  < > 162 167  < > = values in this interval not displayed. Basic Metabolic Panel  Recent Labs  06/16/15 1425 06/17/15 0243  NA 140 138  K 5.1 5.1  CL 107 108  CO2 24 22  GLUCOSE 128* 149*  BUN 12 11  CREATININE 1.29* 1.13    CALCIUM 8.7* 8.6*   Cardiac Enzymes  Recent Labs  06/16/15 1425  TROPONINI 1.50*   Fasting Lipid Panel  Recent Labs  06/16/15 1425  CHOL 145  HDL 29*  LDLCALC 92  TRIG 122  CHOLHDL 5.0    Disposition  Pt is being discharged home today in good condition.  Follow-up Plans & Appointments      Follow-up Information    Follow up with [facility] On 06/25/2015.   Specialty:  Cardiology   Why:  1:30PM. Followup with Dr. Allison Quarry PA Kerin Ransom   Contact information:   [address] [place] [phone]      Follow up with [facility] On 08/06/2015.   Specialty:  Cardiology   Why:  1:00PM. Outpatient echocardiogram to recheck heart function.   Contact information:   [address], [place] [phone]      Schedule an appointment as soon as possible for a visit with [name], MD.   Specialty:  Family Medicine   Contact information:   [place] [address] [phone]       Discharge Medications    Medication List    STOP taking these medications        clopidogrel 75 MG tablet  Commonly known as:  PLAVIX  TAKE these medications        acetaminophen 500 MG tablet  Commonly known as:  TYLENOL  Take 500 mg by mouth every 6 (six) hours as needed (pain).     acetaminophen-codeine 300-30 MG tablet  Commonly known as:  TYLENOL #3  Take 1 tablet by mouth every 4 (four) hours as needed (pain).     aspirin EC 81 MG tablet  Take 81 mg by mouth at bedtime.     cyclobenzaprine 10 MG tablet  Commonly known as:  FLEXERIL  Take 10 mg by mouth 3 (three) times daily as needed for muscle spasms.     D3-1000 1000 units capsule  Generic drug:  Cholecalciferol  Take 1,000 Units by mouth 2 (two) times a week.     ferrous sulfate 325 (65 FE) MG tablet  Take 325 mg by mouth 3 (three) times a week.     FOLIC ACID PO  Take 1 tablet by  mouth 3 (three) times a week.     L-Arginine 500 MG Caps  Take 1 capsule by mouth every 30 (thirty) days.     lisinopril 2.5 MG tablet  Commonly known as:  PRINIVIL,ZESTRIL  Take 1 tablet (2.5 mg total) by mouth daily.     metoprolol succinate 25 MG 24 hr tablet  Commonly known as:  TOPROL XL  Take 1 tablet (25 mg total) by mouth daily.     MUSCLE RUB 10-15 % Crea  Apply 1 application topically as needed for muscle pain (for upper back pain).     nitroGLYCERIN 0.4 MG SL tablet  Commonly known as:  NITROSTAT  Place 1 tablet (0.4 mg total) under the tongue every 5 (five) minutes x 3 doses as needed for chest pain.     nystatin-triamcinolone cream  Commonly known as:  MYCOLOG II  Apply 1 application topically 2 (two) times daily as needed (rash/ yeast infection).     OVER THE COUNTER MEDICATION  Place 1 spray into both nostrils daily as needed (conestion). Nose spray     OVER THE COUNTER MEDICATION  Take 1 tablet by mouth 3 (three) times a week. Beta Prostate Herbal supplement for prostate health.  T     pantoprazole 40 MG tablet  Commonly known as:  PROTONIX  Take 1 tablet (40 mg total) by mouth daily.     polyvinyl alcohol 1.4 % ophthalmic solution  Commonly known as:  LIQUIFILM TEARS  Place 1 drop into both eyes as needed for dry eyes.     PREVIDENT 5000 PLUS 1.1 % Crea dental cream  Generic drug:  sodium fluoride  Place 1 application onto teeth 4 (four) times a week.     pyridoxine 100 MG tablet  Commonly known as:  B-6  Take 100 mg by mouth 3 (three) times a week.     rosuvastatin 40 MG tablet  Commonly known as:  CRESTOR  Take 1 tablet (40 mg total) by mouth daily at 6 PM.     ticagrelor 90 MG Tabs tablet  Commonly known as:  BRILINTA  Take 1 tablet (90 mg total) by mouth 2 (two) times daily.     VITAMIN A PO  Take 1 capsule by mouth 3 (three) times a week.     Vitamin B-12 2500 MCG Subl  Place 1 tablet under the tongue once a week.     vitamin C 500 MG  tablet  Commonly known as:  ASCORBIC ACID  Take 500 mg by mouth  3 (three) times a week.     ZINC PO  Take 1 tablet by mouth every 30 (thirty) days.        Duration of Discharge Encounter   Greater than 30 minutes including physician time.  Hilbert Corrigan PA-C Pager: [phone] 06/18/2015, 3:40 PM

## 2015-06-18 NOTE — Discharge Instructions (Signed)
No driving for 24 hours. No lifting over 5 lbs for 1 week. No sexual activity for 1 week. Keep procedure site clean & dry. If you notice increased pain, swelling, bleeding or pus, call/return!  You may shower, but no soaking baths/hot tubs/pools for 1 week.   Monitor for redness in the urine, if worsen, need to discuss with PCP for potential urological evaluation.     Acute Coronary Syndrome Acute coronary syndrome (ACS) is a serious problem in which there is suddenly not enough blood and oxygen supplied to the heart. ACS may mean that one or more of the blood vessels in your heart (coronary arteries) may be blocked. ACS can result in chest pain or a heart attack (myocardial infarction or MI). CAUSES This condition is caused by atherosclerosis, which is the buildup of fat and cholesterol (plaque) on the inside of the arteries. Over time, the plaque may narrow or block the artery, and this will lessen blood flow to the heart. Plaque can also become weak and break off within a coronary artery to form a clot and cause a sudden blockage. RISK FACTORS The risks factors of this condition include:  High cholesterol levels.  High blood pressure (hypertension).  Smoking.  Diabetes.  Age.  Family history of chest pain, heart disease, or stroke.  Lack of exercise. SYMPTOMS The most common signs of this condition include:  Chest pain, which can be:  A crushing or squeezing in the chest.  A tightness, pressure, fullness, or heaviness in the chest.  Present for more than a few minutes, or it can stop and recur.  Pain in the arms, neck, jaw, or back.  Unexplained heartburn or indigestion.  Shortness of breath.  Nausea.  Sudden cold sweats.  Feeling light-headed or dizzy. Sometimes, this condition has no symptoms. DIAGNOSIS ACS may be diagnosed through the following tests:  Electrocardiogram (ECG).  Blood tests.  Coronary angiogram. This is a procedure to look at the coronary  arteries to see if there is any blockage. TREATMENT Treatment for ACS may include:  Healthy behavioral changes to reduce or control risk factors.  Medicine.  Coronary stenting.A stent helps to keep an artery open.  Coronary angioplasty. This procedure widens a narrowed or blocked artery.  Coronary artery bypass surgery. This will allow your blood to pass the blockage (bypass) to reach your heart. HOME CARE INSTRUCTIONS Eating and Drinking  Follow a heart-healthy diet. A dietitian can you help to educate you about healthy food options and changes.  Use healthy cooking methods such as roasting, grilling, broiling, baking, poaching, steaming, or stir-frying. Talk to a dietitian to learn more about healthy cooking methods. Medicines  Take medicines only as directed by your health care provider.  Do not take the following medicines unless your health care provider approves:  Nonsteroidal anti-inflammatory drugs (NSAIDs), such as ibuprofen, naproxen, or celecoxib.  Vitamin supplements that contain vitamin A, vitamin E, or both.  Hormone replacement therapy that contains estrogen with or without progestin.  Stop illegal drug use. Activities  Follow an exercise program that is approved by your health care provider.  Plan rest periods when you are fatigued. Lifestyle  Do not use any tobacco products, including cigarettes, chewing tobacco, or electronic cigarettes. If you need help quitting, ask your health care provider.  If you drink alcohol, and your health care provider approves, limit your alcohol intake to no more than 1 drink per day. One drink equals 12 ounces of beer, 5 ounces of wine, or  1 ounces of hard liquor.  Learn to manage stress.  Maintain a healthy weight. Lose weight as approved by your health care provider. General Instructions  Manage other health conditions, such as hypertension and diabetes, as directed by your health care provider.  Keep all follow-up  visits as directed by your health care provider. This is important.  Your health care provider may ask you to monitor your blood pressure. A blood pressure reading consists of a higher number over a lower number, such as 110 over 72, written as 110/72. Ideally, your blood pressure should be:  Below 140/90 if you have no other medical conditions.  Below 130/80 if you have diabetes or kidney disease. SEEK IMMEDIATE MEDICAL CARE IF:  You have pain in your chest, neck, arm, jaw, stomach, or back that lasts more than a few minutes, is recurring, or is not relieved by taking medicine under your tongue (sublingual nitroglycerin).  You have profuse sweating without cause.  You have unexplained:  Heartburn or indigestion.  Shortness of breath or difficulty breathing.  Nausea or vomiting.  Fatigue.  Feelings of nervousness or anxiety.  Weakness.  Diarrhea.  You have sudden light-headedness or dizziness.  You faint. These symptoms may represent a serious problem that is an emergency. Do not wait to see if the symptoms will go away. Get medical help right away. Call your local emergency services (911 in the [place] Do not drive yourself to the clinic or hospital.   This information is not intended to replace advice given to you by your health care provider. Make sure you discuss any questions you have with your health care provider.   Document Released: 05/23/2005 Document Revised: 06/13/2014 Document Reviewed: 09/24/2013 Elsevier Interactive Patient Education [employer].

## 2015-06-18 NOTE — Telephone Encounter (Signed)
Called by answering service, the patient was unable to feel his free Brillita prescription. It would cost him $ 1300 at [facility] and $189 at [facility]. Spoken with pharmacist at [facility], Linna Hoff- > It would cost money to patient because of [facility] policy as well as patient insurance.   Spoken with the pharmacy staff at [facility]. Patient will take free Rx as well as Brillinta card. Pharmacy will return benefit check and will figure out he is qualified for free 30 day supply or not. Requested to give few tables of free sample, however they can not.   Spoken scenario with Dr. Barrie Dunker. If he was unable to get Brillinta--> advised to take Plavix 75 mg tonight and call first thing in the morning to figure out further treatment option. We might able to give him a free sample from our [place] office. Might need to contact Drug Representative regarding issue. See cath report and discharge summary for need of Brilliant.   Shuree Brossart, [place]

## 2015-06-18 NOTE — Progress Notes (Signed)
Patient Name: [name] Date of Encounter: 06/18/2015  Principal Problem:   STEMI (ST elevation myocardial infarction) ([place]) Active Problems:   CLL (chronic lymphocytic leukemia) (HCC)   Dyslipidemia, goal LDL below 70 - on Vytorin; well controlled   CAD S/P percutaneous coronary angioplasty -- mid RCA: Xience expedition 2.75 mm x 18 mm (3.1 mm) in 2014   Benign essential HTN   Acute MI, inferolateral wall, initial episode of care ([place])   Length of Stay: 2  SUBJECTIVE  No cardiac complaints. Had urinary retention (residual > 400 mL) and a Foley was placed last night. Known BPH. Elevated WBC is due to CLL, not infection.  CURRENT MEDS . aspirin  81 mg Oral Daily  . docusate sodium  100 mg Oral BID  . ferrous sulfate  325 mg Oral Q breakfast  . lisinopril  2.5 mg Oral Daily  . metoprolol tartrate  12.5 mg Oral BID  . nystatin-triamcinolone   Topical BID  . pantoprazole  40 mg Oral Daily  . rosuvastatin  40 mg Oral q1800  . sodium chloride  3 mL Intravenous Q12H  . ticagrelor  90 mg Oral BID    OBJECTIVE   Intake/Output Summary (Last 24 hours) at 06/18/15 1051 Last data filed at 06/18/15 0703  Gross per 24 hour  Intake    203 ml  Output   1050 ml  Net   -847 ml   Filed Weights   06/16/15 1700  Weight: 178 lb 9.2 oz (81 kg)    PHYSICAL EXAM Filed Vitals:   06/17/15 1630 06/17/15 2119 06/18/15 0159 06/18/15 1015  BP: 112/55 112/56 127/58 116/84  Pulse: 82 72 69 74  Temp:  97.9 F (36.6 C) 97.8 F (36.6 C)   TempSrc:  Oral Oral   Resp:  18 18   Height:      Weight:      SpO2: 97% 95% 96%    General: Alert, oriented x3, no distress Head: no evidence of trauma, PERRL, EOMI, no exophtalmos or lid lag, no myxedema, no xanthelasma; normal ears, nose and oropharynx Neck: normal jugular venous pulsations and no hepatojugular reflux; brisk carotid pulses without delay and no carotid bruits Chest: clear to auscultation, no signs of consolidation by percussion  or palpation, normal fremitus, symmetrical and full respiratory excursions Cardiovascular: normal position and quality of the apical impulse, regular rhythm, normal first and second heart sounds, no rubs or gallops, no murmur Abdomen: no tenderness or distention, no masses by palpation, no abnormal pulsatility or arterial bruits, normal bowel sounds, no hepatosplenomegaly Extremities: no clubbing, cyanosis or edema; 2+ radial, ulnar and brachial pulses bilaterally; 2+ right femoral, posterior tibial and dorsalis pedis pulses; 2+ left femoral, posterior tibial and dorsalis pedis pulses; no subclavian or femoral bruits Neurological: grossly nonfocal  LABS  CBC  Recent Labs  06/16/15 1425  06/17/15 0243 06/18/15 0347  WBC 49.0*  --  47.8* 48.0*  NEUTROABS 3.9  --   --   --   HGB 15.0  --  15.3 15.1  HCT 48.0  --  48.2 47.9  MCV 96.8  --  96.2 94.5  PLT 169  < > 162 167  < > = values in this interval not displayed. Basic Metabolic Panel  Recent Labs  06/16/15 1425 06/17/15 0243  NA 140 138  K 5.1 5.1  CL 107 108  CO2 24 22  GLUCOSE 128* 149*  BUN 12 11  CREATININE 1.29* 1.13  CALCIUM 8.7* 8.6*  Cardiac Enzymes  Recent Labs  06/16/15 1425  TROPONINI 1.50*   Fasting Lipid Panel  Recent Labs  06/16/15 1425  CHOL 145  HDL 29*  LDLCALC 92  TRIG 122  CHOLHDL 5.0    Radiology Studies Imaging results have been reviewed and No results found.  TELE NSR  ECG Normal sinus rhythm, Left axis deviation, Inferior infarct , evolving T wave abnormality of inferolateral ischemia  ASSESSMENT AND PLAN  1. CAD s/p acute lateral wall STEMI due to LCX thrombotic occlusion, treated with DES, remote DES-RCA x2 - angina free, no malignant arrhythmia or CHF signs/sxs - troponin release was minimal, echo still pending; expect minimal change in LV function - cardiac rehab eval - on low dose metoprolol, tolerating low dose ACEi (limited by BP) - ASA 81 mg + Brilinta 12  months  2. Hyperlipidemia - target LDL-C <70; disease progression despite LDL <100 on Vytorin. Switch to high dose rosuvastatin and add zetia or PCSK9 inhibitors if still not at target  3. CLL - followed by Dr. Whitney Muse  4. 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 Urinary retention Try to remove Foley and void prior to DC, hopefully later today.   Sanda Klein, MD, [facility] [phone] office [phone] pager 06/18/2015 10:51 AM

## 2015-06-18 NOTE — Progress Notes (Signed)
Pt reported that he is unable to void and has been having this issue for 2-3days. He reports pain with very little urination and his stomach feels bloated. >425ml reported from bladder scan. RN also noticed WBC 47.8 and pt has history of BPH.  RN paged Dr. Philbert Riser. MD aware and new orders received to place foley catheter and obtain CBC.  RN will follow orders and continue to monitor.  Arnell Sieving, RN

## 2015-06-19 NOTE — Progress Notes (Signed)
Pt. Discharged Pt. D/C'd via wheelchair with wife Discharge information reviewed and given All personal belongings given to Pt.  Education discussed IV was d/c and intact upon removal  Tele d/c Shirley Muscat 06/18/15 0525

## 2015-06-19 NOTE — Telephone Encounter (Signed)
Spoke to patient [name] 90 mg samples left at [employer] office front desk.Advised I will send message to our pharmacist Erasmo Downer to help with patient assistance for [name].

## 2015-06-19 NOTE — Telephone Encounter (Signed)
Could you please follow up on this today?

## 2015-06-19 NOTE — Telephone Encounter (Signed)
He should come to office in the AM & get some samples until we can figure this out.  Leonie Man, MD

## 2015-06-19 NOTE — Telephone Encounter (Signed)
Called phone number on record twice. No answer

## 2015-06-22 ENCOUNTER — Other Ambulatory Visit (HOSPITAL_COMMUNITY)
Admission: RE | Admit: 2015-06-22 | Discharge: 2015-06-22 | Disposition: A | Discharge status: Home / Self Care | Payer: Medicare HMO | Source: Ambulatory Visit | Attending: Physician Assistant | Admitting: Physician Assistant

## 2015-06-22 DIAGNOSIS — R319 Hematuria, unspecified: Secondary | ICD-10-CM | POA: Diagnosis not present

## 2015-06-22 DIAGNOSIS — D649 Anemia, unspecified: Secondary | ICD-10-CM | POA: Diagnosis not present

## 2015-06-22 LAB — CBC
HCT: 47.1 % (ref 39.0–52.0)
Hemoglobin: 14.6 g/dL (ref 13.0–17.0)
MCH: 29.7 pg (ref 26.0–34.0)
MCHC: 31 g/dL (ref 30.0–36.0)
MCV: 95.7 fL (ref 78.0–100.0)
Platelets: 197 10*3/uL (ref 150–400)
RBC: 4.92 MIL/uL (ref 4.22–5.81)
RDW: 14.5 % (ref 11.5–15.5)
WBC: 46.3 10*3/uL — ABNORMAL HIGH (ref 4.0–10.5)

## 2015-06-22 NOTE — Telephone Encounter (Signed)
Called and left voice mail to call back

## 2015-06-23 NOTE — Telephone Encounter (Signed)
Patient contacted regarding discharge from blank on blank.   Patient understands to follow up with provider  At [facility] office 06/25/15.  Patient understands discharge instructions Patient understands medications and regiment Patient understands to bring all medications to this visit

## 2015-06-25 ENCOUNTER — Ambulatory Visit (INDEPENDENT_AMBULATORY_CARE_PROVIDER_SITE_OTHER): Payer: Medicare HMO | Admitting: Cardiology

## 2015-06-25 ENCOUNTER — Encounter: Payer: Medicare HMO | Admitting: Physician Assistant

## 2015-06-25 ENCOUNTER — Encounter: Payer: Self-pay | Admitting: Cardiology

## 2015-06-25 VITALS — BP 124/74 | HR 59 | Ht 69.0 in | Wt 177.0 lb

## 2015-06-25 DIAGNOSIS — I255 Ischemic cardiomyopathy: Secondary | ICD-10-CM | POA: Diagnosis not present

## 2015-06-25 DIAGNOSIS — I2510 Atherosclerotic heart disease of native coronary artery without angina pectoris: Secondary | ICD-10-CM | POA: Diagnosis not present

## 2015-06-25 DIAGNOSIS — I2102 ST elevation (STEMI) myocardial infarction involving left anterior descending coronary artery: Secondary | ICD-10-CM | POA: Diagnosis not present

## 2015-06-25 DIAGNOSIS — F4310 Post-traumatic stress disorder, unspecified: Secondary | ICD-10-CM | POA: Diagnosis not present

## 2015-06-25 DIAGNOSIS — C9110 Chronic lymphocytic leukemia of B-cell type not having achieved remission: Secondary | ICD-10-CM

## 2015-06-25 DIAGNOSIS — I10 Essential (primary) hypertension: Secondary | ICD-10-CM

## 2015-06-25 DIAGNOSIS — Z9861 Coronary angioplasty status: Secondary | ICD-10-CM

## 2015-06-25 DIAGNOSIS — E785 Hyperlipidemia, unspecified: Secondary | ICD-10-CM

## 2015-06-25 NOTE — Assessment & Plan Note (Signed)
Controlled.  

## 2015-06-25 NOTE — Patient Instructions (Signed)
Your physician wants you to follow-up in: 6 Months with Dr. Ellyn Hack. You will receive a reminder letter in the mail two months in advance. If you don't receive a letter, please call our office to schedule the follow-up appointment.  Your physician recommends that you continue on your current medications as directed. Please refer to the Current Medication list given to you today  Your physician has requested that you have an echocardiogram. Echocardiography is a painless test that uses sound waves to create images of your heart. It provides your doctor with information about the size and shape of your heart and how well your heart's chambers and valves are working. This procedure takes approximately one hour. There are no restrictions for this procedure.  If you need a refill on your cardiac medications before your next appointment, please call your pharmacy.  Thank you for choosing [facility]!

## 2015-06-25 NOTE — Assessment & Plan Note (Signed)
Stage I, ZAP-70 negative.

## 2015-06-25 NOTE — Assessment & Plan Note (Signed)
With Depression - since MI

## 2015-06-25 NOTE — Assessment & Plan Note (Signed)
PCI of mid RCA -- Xience DES 06/16/15

## 2015-06-25 NOTE — Assessment & Plan Note (Signed)
Seen today post STEMI 06/16/15

## 2015-06-25 NOTE — Assessment & Plan Note (Signed)
Echo 06/18/2015 EF 35-40% after lateral MI, plan to repeat in 6 weeks

## 2015-06-25 NOTE — Assessment & Plan Note (Signed)
LDL was 92 06/17/15

## 2015-06-25 NOTE — Progress Notes (Signed)
06/25/2015 [name]   1945-11-20  [id number]  Primary Physician Jani Gravel, MD Primary Cardiologist: Dr Ellyn Hack  HPI:  70 year old male with past medical history of CAD (s/p 2 overlapping DES to [place] in 11/2012), HTN, HLD, CLL, and BPH who presented to [facility] on 06/16/2015 as a CODE STEMI.  Cardiac catheterization showed 99% distal left circumflex thrombotic lesion treated with a DES. His EF was 35-45%. His Plavix was changed to Brilinta 90 mg twice a day.  He was also started on high-dose rosuvastatin, he had been on Vytorin prior to admission.             He is in the office today for f/u. He has had some problems getting his medications secondary to cost. He was given a 30 day supple of Brilinta as well as samples from our office. His wife said they had to go to two pharmacies before one would honor the 30 day free card. He has not filled the Rx for Crestor (> 100$) and he didn't start Lisinopril 2.5 mg or Toprol 25 mg as ordered. He did fill the prescriptions. He has not had chest pain or dyspnea.   Current Outpatient Prescriptions  Medication Sig Dispense Refill  . acetaminophen (TYLENOL) 500 MG tablet Take 500 mg by mouth every 6 (six) hours as needed (pain). Reported on 06/25/2015    . aspirin EC 81 MG tablet Take 81 mg by mouth at bedtime.    . Cholecalciferol (D3-1000) 1000 UNITS capsule Take 1,000 Units by mouth 2 (two) times a week.      . Cyanocobalamin (VITAMIN B-12) 2500 MCG SUBL Place 1 tablet under the tongue once a week. Reported on 06/25/2015    . cyclobenzaprine (FLEXERIL) 10 MG tablet Take 10 mg by mouth 3 (three) times daily as needed for muscle spasms.   0  . ferrous sulfate 325 (65 FE) MG tablet Take 325 mg by mouth 3 (three) times a week.     Marland Kitchen FOLIC ACID PO Take 1 tablet by mouth 3 (three) times a week.    Marland Kitchen lisinopril (PRINIVIL,ZESTRIL) 2.5 MG tablet Take 1 tablet (2.5 mg total) by mouth daily. 90 tablet 3  . Menthol-Methyl Salicylate (MUSCLE RUB)  10-15 % CREA Apply 1 application topically as needed for muscle pain (for upper back pain).     . metoprolol succinate (TOPROL XL) 25 MG 24 hr tablet Take 1 tablet (25 mg total) by mouth daily. 90 tablet 3  . Multiple Vitamins-Minerals (ZINC PO) Take 1 tablet by mouth every 30 (thirty) days.    . nitroGLYCERIN (NITROSTAT) 0.4 MG SL tablet Place 1 tablet (0.4 mg total) under the tongue every 5 (five) minutes x 3 doses as needed for chest pain. 25 tablet 6  . nystatin-triamcinolone (MYCOLOG II) cream Apply 1 application topically 2 (two) times daily as needed (rash/ yeast infection).   0  . OVER THE COUNTER MEDICATION Take 1 tablet by mouth 3 (three) times a week. Reported on 06/25/2015    . pantoprazole (PROTONIX) 40 MG tablet Take 1 tablet (40 mg total) by mouth daily. 30 tablet 5  . polyvinyl alcohol (LIQUIFILM TEARS) 1.4 % ophthalmic solution Place 1 drop into both eyes as needed for dry eyes.    . ticagrelor (BRILINTA) 90 MG TABS tablet Take 1 tablet (90 mg total) by mouth 2 (two) times daily. 180 tablet 3  . VITAMIN A PO Take 1 capsule by mouth 3 (three) times a week.    Marland Kitchen  vitamin C (ASCORBIC ACID) 500 MG tablet Take 500 mg by mouth 3 (three) times a week.     Marland Kitchen acetaminophen-codeine (TYLENOL #3) 300-30 MG tablet Take 1 tablet by mouth every 4 (four) hours as needed (pain). Reported on 06/25/2015  1  . L-Arginine 500 MG CAPS Take 1 capsule by mouth every 30 (thirty) days. Reported on 06/25/2015    . pyridoxine (B-6) 100 MG tablet Take 100 mg by mouth 3 (three) times a week. Reported on 06/25/2015     No current facility-administered medications for this visit.    No Known Allergies  Social History   Social History  . Marital Status: Married    Spouse Name: N/A  . Number of Children: N/A  . Years of Education: N/A   Occupational History  . Not on file.   Social History Main Topics  . Smoking status: Former Smoker -- 0.25 packs/day for 2 years    Types: Cigarettes    Quit date:  06/07/1963  . Smokeless tobacco: Never Used  . Alcohol Use: No     Comment: Occasionally drinks red wine  . Drug Use: No  . Sexual Activity: Not on file   Other Topics Concern  . Not on file   Social History Narrative   Currently in Cardiac Rehabilitation   "former smoker". Does not drink EtOH   Married.     Review of Systems: General: negative for chills, fever, night sweats or weight changes.  Cardiovascular: negative for chest pain, dyspnea on exertion, edema, orthopnea, palpitations, paroxysmal nocturnal dyspnea or shortness of breath Dermatological: negative for rash Respiratory: negative for cough or wheezing Urologic: negative for hematuria Abdominal: negative for nausea, vomiting, diarrhea, bright red blood per rectum, melena, or hematemesis Neurologic: negative for visual changes, syncope, or dizziness All other systems reviewed and are otherwise negative except as noted above.    Blood pressure 124/74, pulse 59, height 5\' 9"  (1.753 m), weight 177 lb (80.287 kg), SpO2 99 %.  General appearance: alert, cooperative, no distress and mildly obese Neck: no carotid bruit and no JVD Lungs: clear to auscultation bilaterally Heart: regular rate and rhythm Extremities: trace edema Neurologic: Grossly normal   ASSESSMENT AND PLAN:   STEMI - 06/16/15 Seen today post STEMI 06/16/15  CAD S/P PCI-- RCA DES 2014,  CFX DES Jan 2017 PCI of mid RCA -- Xience DES 06/16/15  Ischemic cardiomyopathy Echo 06/18/2015 EF 35-40% after lateral MI, plan to repeat in 6 weeks  PTSD (post-traumatic stress disorder) With Depression - since MI  Dyslipidemia, goal LDL below 70 - on Vytorin; well controlled LDL was 92 06/17/15  CLL (chronic lymphocytic leukemia) (HCC) Stage I, ZAP-70 negative.  Benign essential HTN Controlled   PLAN  I encouraged him to start Lisinopril and Toprol as ordered. He has filled out paperwork for Brilinta assistance. He will not be able to afford Crestor (even  though its generic its still expensive) and I suggested he resume Vytorin 10/40. He is scheduled for an echo in March and should see Dr Ellyn Hack in June.   Kerin Ransom K PA-C 06/25/2015 2:02 PM

## 2015-08-05 ENCOUNTER — Telehealth: Payer: Self-pay | Admitting: Cardiology

## 2015-08-05 NOTE — Telephone Encounter (Signed)
Medication samples have been provided to the patient.  Drug name: Brilinta 90 mg Qty: 32 tablets LOT: [id number] Exp.Date: 03/2018  Samples left at front desk for patient pick-up. Patient notified.  [place], [place] 12:08 PM 08/05/2015

## 2015-08-05 NOTE — Telephone Encounter (Signed)
Follow up   Pt is calling back because he has not heard from the Refill dept  He is now wanting to speak to [place] or a RN   Please call pt

## 2015-08-05 NOTE — Telephone Encounter (Signed)
Patient calling the office for samples of medication:   1.  What medication and dosage are you requesting samples for?   Brilinta 90mg   2.  Are you currently out of this medication?   no

## 2015-08-06 ENCOUNTER — Ambulatory Visit (HOSPITAL_COMMUNITY): Payer: Medicare HMO | Attending: Cardiology

## 2015-08-06 ENCOUNTER — Other Ambulatory Visit: Payer: Self-pay

## 2015-08-06 DIAGNOSIS — E119 Type 2 diabetes mellitus without complications: Secondary | ICD-10-CM | POA: Diagnosis not present

## 2015-08-06 DIAGNOSIS — I119 Hypertensive heart disease without heart failure: Secondary | ICD-10-CM | POA: Insufficient documentation

## 2015-08-06 DIAGNOSIS — I519 Heart disease, unspecified: Secondary | ICD-10-CM

## 2015-08-06 DIAGNOSIS — I10 Essential (primary) hypertension: Secondary | ICD-10-CM

## 2015-08-06 DIAGNOSIS — I340 Nonrheumatic mitral (valve) insufficiency: Secondary | ICD-10-CM | POA: Diagnosis not present

## 2015-08-06 DIAGNOSIS — R29898 Other symptoms and signs involving the musculoskeletal system: Secondary | ICD-10-CM | POA: Diagnosis not present

## 2015-08-07 DIAGNOSIS — I2510 Atherosclerotic heart disease of native coronary artery without angina pectoris: Secondary | ICD-10-CM | POA: Diagnosis not present

## 2015-09-10 ENCOUNTER — Other Ambulatory Visit: Payer: Self-pay | Admitting: Cardiology

## 2015-09-10 NOTE — Telephone Encounter (Signed)
Returned call to patient he stated he is almost out of [place].Stated Kary Kos is too expensive he cannot afford.Stated he never heard back from [place] patient assistance.Stated he wanted to ask Dr.Harding if he can prescribe another blood thinner.He has noticed he has been sob since he started taking.Advised I will leave Brilinta 90 mg samples at [facility] office front desk.I will send message to [place].

## 2015-09-10 NOTE — Telephone Encounter (Signed)
New message      Pt c/o medication issue:  1. Name of Medication:  brilinta  2. How are you currently taking this medication (dosage and times per day)? 90mg  bid 3. Are you having a reaction (difficulty breathing--STAT)? no 4. What is your medication issue?  Pt cannot afford brilinta.  Can he go back on generic plavix?

## 2015-09-11 MED ORDER — CLOPIDOGREL BISULFATE 75 MG PO TABS: 75.0000 mg | ORAL_TABLET | Freq: Every day | ORAL | Status: DC

## 2015-09-11 NOTE — Telephone Encounter (Signed)
Left message to a call back in regards to medication change

## 2015-09-11 NOTE — Telephone Encounter (Signed)
SPOKE TO PATIENT  PATIENT AWARE  STOP BRILINTA  AND START CLOPIDOGREL 75 MG  2 TABLETS THE FIRST DAY ,THEN 1 TABLET DAILY  PATIENT VERBALIZED UNDERSTANDING

## 2015-09-11 NOTE — Addendum Note (Signed)
Addended by: Raiford Simmonds on: 09/11/2015 05:31 PM   Modules accepted: Orders

## 2015-09-11 NOTE — Telephone Encounter (Signed)
Follow up  ° ° °Patient returning call back to nurse  °

## 2015-09-11 NOTE — Telephone Encounter (Signed)
Can switch to Plavix for next prescription. Simply take 2 tablets on the first day then continue 75 mg twice a day.  Leonie Man, MD

## 2015-09-29 ENCOUNTER — Other Ambulatory Visit: Payer: Self-pay | Admitting: Cardiology

## 2015-09-30 NOTE — Telephone Encounter (Signed)
Rx(s) sent to pharmacy electronically.  

## 2015-10-05 ENCOUNTER — Telehealth: Payer: Self-pay | Admitting: Cardiology

## 2015-10-05 NOTE — Telephone Encounter (Signed)
New message      Calling to get clarification on stopping brilinta and starting generic plavix.  Please call

## 2015-10-06 NOTE — Telephone Encounter (Signed)
Returning cal from yesterday,he does not know who called.

## 2015-10-06 NOTE — Telephone Encounter (Signed)
Spoke with wife, she states they already have the clopidogrel prescription filled, they just wanted to clarify that he should take 2 tablets for his first dose, then 1 tablet daily.  She states Dr. Ellyn Hack has already given them approval and wrote the prescription.    Explained how to take 2 tablets (at same time) 12 hours after last Brilinta dose, then once daily thereafter.    Patient did fill out patient assistance for Brilinta, we mailed in February, but has never heard back and not interested in pursuing when can afford clopidogrel.

## 2015-10-16 ENCOUNTER — Ambulatory Visit (INDEPENDENT_AMBULATORY_CARE_PROVIDER_SITE_OTHER): Payer: Medicare HMO | Admitting: Urology

## 2015-10-16 DIAGNOSIS — R972 Elevated prostate specific antigen [PSA]: Secondary | ICD-10-CM | POA: Diagnosis not present

## 2015-10-16 DIAGNOSIS — R351 Nocturia: Secondary | ICD-10-CM | POA: Diagnosis not present

## 2015-10-16 DIAGNOSIS — N401 Enlarged prostate with lower urinary tract symptoms: Secondary | ICD-10-CM

## 2015-11-05 ENCOUNTER — Telehealth: Payer: Self-pay | Admitting: Pharmacist Clinician (PhC)/ Clinical Pharmacy Specialist

## 2015-11-05 NOTE — Telephone Encounter (Signed)
Patient reports used up all samples, did not see any incorrect tablets. Now on clopidogrel

## 2015-11-26 NOTE — Progress Notes (Signed)
PCP: Jani Gravel, MD  Clinic Note: Chief Complaint  Patient presents with  . Follow-up    CAD-recent STEMI with PCI to circumflex  . Cardiomyopathy    Ischemic    HPI: [name] is a 70 y.o. male with a PMH below who presents today for annual follow-up of CAD-non-STEMI with PCI to RCA in June 2014. Normal EF by LV gram..  [name] was last seen by me on November 22 2014 he was doing well. Was walking about 1/2-1 mile a day 3-4 days a week. He noted having musculoskeletal pain in the rib cage.  Recent Hospitalizations: He was admitted in January 2017 with a inferior lateral STEMI involving 95% stenosis in the distal circumflex --> Xience 3.0 x 15 mm stent postdilated 3.6 mm. EF 3545%.  Plavix was changed to Brilinta. He saw Kerin Ransom in follow-up in the [place] office shortly thereafter, he was noting having some depression symptoms. He never filled the prescription for Crestor because of money. He did not start lisinopril or Toprol. He was having a hard time getting the Brilinta free card honored. --> He apparently is back taking Vytorin but not routinely. He also was switched from Brilinta to Plavix, partly for financial issues, and also for having dyspnea spells with Brilinta. This is my first visit with him since the visit with [facility]. He did a repeat echocardiogram performed to reassess the extent of ischemic cardiomyopathy. See results below. Major issue that time was getting his medications.  Studies Reviewed:  Procedures Jan 03/2016   Coronary Stent Intervention   Left Heart Cath and Coronary Angiography    Conclusion     Patent stent in the RCA  Dist Cx thrombotic lesion, 99% stenosed which was the culprit for today's presentation. Post intervention with aspiration thrombectomy and a 3.0 x 15 Xience drug-eluting stent, postdilated to 3.6 mm, there is a 0% residual stenosis.  There is moderate left ventricular systolic dysfunction.  Continue dual antiplatelet  therapy for at least a year. Will change clopidogrel to Brilinta 90 mg twice a day.    3.0 x 15 Xience drug-eluting stent (3.6 mm)            Echo 08/06/2015: EF improved after 40 and 45%. (Up from 35-40%). Akinesis of the basal-mid inferolateral wall. Hypokinesis of the entire lateral wall.  Interval History: Day for routine follow-up and has not had any significant anginal symptoms or heart failure symptoms since his last MI. Leading back into his regular level activity. He does have some lower extremity edema, and he says is PCP prescribed "fluid pill" but is not sure what it is. He has not gotten filled yet. He has a tendency of not getting medications filled.  He denies any repeat episodes of chest tightness or pressure with rest or exertion. Maybe notes  exertional dyspnea if he overdoes it. No PND, orthopnea but does have edema maybe 1-2+.  He is out about doing routine activities including lead whacking lawnmowing and other yard work. He only gets really short of breath and tired when he is overdoing it in the heat. He really doesn't take his Vytorin very frequently. He states he takes it "everything down" he wasn't sure it was postdilated everyday. He denies any significant lightheadedness dizziness or wooziness. No rapid irregular heartbeat/palpitations. No sick be/near syncope or TIA/amaurosis fugax.  No melena, hematochezia, hematuria, or epstaxis. No claudication.  ROS: A comprehensive was performed. Review of Systems  Constitutional: Negative for malaise/fatigue.  HENT: Negative for ear discharge and tinnitus.   Eyes: Positive for blurred vision (He thinks he probably needs to have his eyes checked).  Respiratory: Negative for cough, shortness of breath and wheezing.   Gastrointestinal: Negative for heartburn, abdominal pain and constipation.  Genitourinary: Negative for dysuria and frequency.  Musculoskeletal: Positive for joint pain (Stiff joints from arthritis). Negative  for myalgias.  Neurological: Positive for tingling (Sometimes in his feet). Negative for dizziness, sensory change, speech change, loss of consciousness and headaches.  Endo/Heme/Allergies: Bruises/bleeds easily (Mild; better since switching back to Plavix).  Psychiatric/Behavioral: Positive for memory loss (Seems to be a bit slow with recall). Negative for depression (Depression symptoms have improved.). The patient is not nervous/anxious.   All other systems reviewed and are negative.   Past Medical History  Diagnosis Date  . Diverticulitis   . Hypercholesteremia   . BPH (benign prostatic hyperplasia) 04/08/2011  . Gallstone   . Depression   . NSTEMI (non-ST elevated myocardial infarction) ([place]) 11/23/12    NSTEMI 11/23/2012 RCA lesion - PCI with DES; moderate LAD disease  . Acute MI, inferolateral wall, initial episode of care ([place]) 06/16/15    99% dCx   . CAD S/P percutaneous coronary angioplasty 11/23/12; 06/16/15    a. 2 overlapping mRCA lesions - Xience Xpedition DES 2.75 mm x 18 mm (3.0 mm);; b. Inf-Lat STEMI 06/16/2015 - dCx Asp Thrombectomy & PCI 3.0 x 15 Xience drDES (3.6 mm)  . PTSD (post-traumatic stress disorder)     With Depression - since MI  . CLL (chronic lymphocytic leukemia) ([place]) 04/08/2011  . GERD (gastroesophageal reflux disease)   . Chronic headache   . Ischemic cardiomyopathy     Echo 06/18/2015 EF 35-40% after lateral MI -> repeat echo 08/06/2015: EF 40-45% with basal-mid inferolateral akinesis and hypokinesis of the lateral wall.    Past Surgical History  Procedure Laterality Date  . Appendectomy  [id number]  . Colonoscopy  2008    [id number] diverticula, diminutive polyp in the cecum, s/p bx Normal rectum. Tubular adenoma  . Colonoscopy N/A 10/17/2012    Dr. Gala Romney: colonic diverticulosis, tubular adenoma, surveillance due May 2019  . Coronary stent placement  11/23/12    Mid RCA -- Xience eXp - 2.75 mm x 18 mm DES (3.50mm) , Dr. Ellyn Hack  . Left heart  catheterization with coronary angiogram N/A 11/23/2012    Procedure: LEFT HEART CATHETERIZATION WITH CORONARY ANGIOGRAM;  Surgeon: Leonie Man, MD;  Location: [facility] CATH LAB;  Service: Cardiovascular: NSTEMI: Culprit = mid RCA 95% & 75%; LAD ~40-50%, Small RI ~60%; EF ~60%, mild inf-basal HK     . Cardiac catheterization N/A 06/16/2015    Procedure: Left Heart Cath and Coronary Angiography;  Surgeon: Jettie Booze, MD;  Location: [place] CV LAB;  Service: Cardiovascular: 99% thrombotic dCx -> asp thrombectomy & PCI. RCA Stents Patent.   . Cardiac catheterization N/A 06/16/2015    Procedure: Coronary Stent Intervention;  Surgeon: Jettie Booze, MD;  Location: [place] CV LAB;  Service: Cardiovascular: PCI dCx = thrombectomy -> 3.0 x 15 Xience drug-eluting stent (3.6 mm)   . Transthoracic echocardiogram  06/18/2015    a. EF 35-40%. Entire inferior hypokinesis and akinesis of the basal and mid inferolateral and anterolateral /distal lateral walls;; b. EF 40 and 45%. Akinesis of basal-mid inferolateral wall. Hypokinesis of entire lateral wall.    Prior to Admission medications   Medication Sig Start Date End Date Taking? Authorizing Provider  acetaminophen (TYLENOL)  500 MG tablet Take 500 mg by mouth every 6 (six) hours as needed (pain). Reported on 06/25/2015    Historical Provider, MD  aspirin EC 81 MG tablet Take 81 mg by mouth at bedtime.    Historical Provider, MD  Cholecalciferol (D3-1000) 1000 UNITS capsule Take 1,000 Units by mouth 2 (two) times a week.      Historical Provider, MD  clopidogrel (PLAVIX) 75 MG tablet Take 1 tablet (75 mg total) by mouth daily. 09/11/15   Leonie Man, MD  Cyanocobalamin (VITAMIN B-12) 2500 MCG SUBL Place 1 tablet under the tongue once a week. Reported on 06/25/2015    Historical Provider, MD  cyclobenzaprine (FLEXERIL) 10 MG tablet Take 10 mg by mouth 3 (three) times daily as needed for muscle spasms.  03/09/15   Historical Provider, MD  ferrous  sulfate 325 (65 FE) MG tablet Take 325 mg by mouth 3 (three) times a week.     Historical Provider, MD  FOLIC ACID PO Take 1 tablet by mouth 3 (three) times a week.    Historical Provider, MD  L-Arginine 500 MG CAPS Take 1 capsule by mouth every 30 (thirty) days. Reported on 06/25/2015    Historical Provider, MD  lisinopril (PRINIVIL,ZESTRIL) 2.5 MG tablet Take 1 tablet (2.5 mg total) by mouth daily. 06/18/15   Almyra Deforest, PA  Menthol-Methyl Salicylate (MUSCLE RUB) 10-15 % CREA Apply 1 application topically as needed for muscle pain (for upper back pain).     Historical Provider, MD  metoprolol succinate (TOPROL XL) 25 MG 24 hr tablet Take 1 tablet (25 mg total) by mouth daily. 06/18/15   Almyra Deforest, PA  Multiple Vitamins-Minerals (ZINC PO) Take 1 tablet by mouth every 30 (thirty) days.    Historical Provider, MD  nitroGLYCERIN (NITROSTAT) 0.4 MG SL tablet Place 1 tablet (0.4 mg total) under the tongue every 5 (five) minutes as needed for chest pain. 09/30/15   Leonie Man, MD  nystatin-triamcinolone [facility] II) cream Apply 1 application topically 2 (two) times daily as needed (rash/ yeast infection).  07/07/14   Historical Provider, MD  OVER THE COUNTER MEDICATION Take 1 tablet by mouth 3 (three) times a week. Reported on 06/25/2015    Historical Provider, MD  pantoprazole (PROTONIX) 40 MG tablet Take 1 tablet (40 mg total) by mouth daily. 06/08/13   Marjean Donna, MD  polyvinyl alcohol (LIQUIFILM TEARS) 1.4 % ophthalmic solution Place 1 drop into both eyes as needed for dry eyes.    Historical Provider, MD  pyridoxine (B-6) 100 MG tablet Take 100 mg by mouth 3 (three) times a week. Reported on 06/25/2015    Historical Provider, MD  VITAMIN A PO Take 1 capsule by mouth 3 (three) times a week.    Historical Provider, MD  vitamin C (ASCORBIC ACID) 500 MG tablet Take 500 mg by mouth 3 (three) times a week.     Historical Provider, MD    No Known Allergies   Social History   Social History  . Marital  Status: Married    Spouse Name: N/A  . Number of Children: N/A  . Years of Education: N/A   Social History Main Topics  . Smoking status: Former Smoker -- 0.25 packs/day for 2 years    Types: Cigarettes    Quit date: 06/07/1963  . Smokeless tobacco: Never Used  . Alcohol Use: No     Comment: Occasionally drinks red wine  . Drug Use: No  . Sexual Activity: Not Asked  Other Topics Concern  . None   Social History Narrative   Currently in Cardiac Rehabilitation   "former smoker". Does not drink EtOH   Married.   Family History  Problem Relation Age of Onset  . Diabetes Mother   . Cancer Father   . Colon cancer Neg Hx     Wt Readings from Last 3 Encounters:  11/27/15 176 lb 9.6 oz (80.105 kg)  06/25/15 177 lb (80.287 kg)  06/16/15 178 lb 9.2 oz (81 kg)    PHYSICAL EXAM BP 116/63 mmHg  Pulse 52  Ht 5\' 9"  (1.753 m)  Wt 176 lb 9.6 oz (80.105 kg)  BMI 26.07 kg/m2 General appearance: alert, cooperative, appears stated age, no distress and relatively healthy-appearing. Well-nourished and well-groomed. Pleasant mood and affect.  HEENT: [place]/AT, EOMI, MMM, anicteric sclera  Neck: no adenopathy, no carotid bruit, no JVD, supple, symmetrical, trachea midline and thyroid not enlarged, symmetric, no tenderness/mass/nodules  Lungs: CTA B., normal percussion bilaterally and nonlabored, good air movement.  Heart: RRR, S1, S2 normal, no murmur, click, rub or gallop and normal apical impulse -- palpation lung inferior costal margin reveals significant costochondral tenderness that is reproducible  Abdomen: soft, non-tender; bowel sounds normal; no masses, no organomegaly  Extremities: No C./C./ ~2+ BLE pitting edema; mild stasis dermatitis & varicose veins. no ulcers or lesions;Pulses: 2+ and symmetric  Neurologic: Alert and oriented X 3, normal strength and tone. Normal symmetric reflexes. Normal coordination and gait     Adult ECG Report Not checked   Other studies  Reviewed: Additional studies/ records that were reviewed today include:  Recent Labs:    Lab Results  Component Value Date   CHOL 145 06/16/2015   HDL 29* 06/16/2015   LDLCALC 92 06/16/2015   TRIG 122 06/16/2015   CHOLHDL 5.0 06/16/2015    ASSESSMENT / PLAN: Problem List Items Addressed This Visit    Lower leg edema (Chronic)    He had been on HCTZ before. But no longer on it. He says he is on some type of fluid pill which I think may be Lasix. I wanted to get that filled.  He really doesn't have other existing heart failure symptoms of PND or orthopnea to go the edema. Probably more from venous stasis then from CHF.  As I have always done, i.e. recommend compression stockings. I given a prescription for compression stockings.      Ischemic cardiomyopathy    An echocardiogram was checked a little bit soon I would've anticipated, however it did show improvement in EF up to a moderate reduction. Wall motion abnormality is as per expected from his MIs. I suspect he has some collateral ischemia in the prior infarct area as well. Not having active heart physicians, although he does have some edema doesn't seem to be related to PND or orthopnea and therefore not likely related to left heart failure. He is on ACE inhibitor and beta blocker and likely being started on diuretic.       Relevant Medications   ezetimibe-simvastatin (VYTORIN) 10-40 MG tablet   Dyslipidemia, goal LDL below 70 - on Vytorin; well controlled (Chronic)    His labs from January were not quite as good as I would like to be. He is not taking Vytorin every day 1 to increase to every day. There've been talked to him being on Crestor in the past, but this was switched back to Vytorin because of insurance issues. Recheck labs in the next month or so since  it is now 6 months out.      Relevant Medications   ezetimibe-simvastatin (VYTORIN) 10-40 MG tablet   CAD S/P PCI-- RCA DES 2014,  CFX DES Jan 2017 (Chronic)    Overall  active without significant symptoms. Now has stents in the RCA as well as circumflex. Both are DES. Had breathing issues with Brilinta, therefore back on aspirin and Plavix. On low-dose of both beta blocker and ACE inhibitor. He has a statin but is not taking it routinely. I recommend that he take it every day.       Relevant Medications   ezetimibe-simvastatin (VYTORIN) 10-40 MG tablet   Benign essential HTN (Chronic)    Well-controlled on current regimen.      Relevant Medications   ezetimibe-simvastatin (VYTORIN) 10-40 MG tablet   Acute inferolateral myocardial infarction ([place])    Clearly by echocardiogram he has inferolateral hypokinesis/akinesis on echo, but his EF has improved. We would look like his distal circumflex cover large distribution, and this continued damage done by his original MI of the RCA. No recurrent angina or heart failure symptoms.      Relevant Medications   ezetimibe-simvastatin (VYTORIN) 10-40 MG tablet    Other Visit Diagnoses    Dyslipidemia    -  Primary    Relevant Medications    ezetimibe-simvastatin (VYTORIN) 10-40 MG tablet    Other Relevant Orders    Lipid panel    Comprehensive metabolic panel    Medication management        Relevant Orders    Lipid panel    Comprehensive metabolic panel    Bilateral edema of lower extremity        Relevant Orders    Compression stockings       Current medicines are reviewed at length with the patient today. (+/- concerns) Wasn't sure about when to take Vytorin The following changes have been made:   take VYTORIN 10/40 mg EVERY OTHER DAY  Please pick up the prescription for a diuretic/fluid pill that was prescribed by your primary care MD. Please call our office to let us know what that medication was so we can update our records.   Your physician recommends that you return for lab work FASTING - lipid, CMET You can have the labs done in [place]  Dr. Ellyn Hack has recommended compression stockings    Your physician wants you to follow-up in: December 2017    Studies Ordered:   Orders Placed This Encounter  Procedures  . Compression stockings  . Lipid panel  . Comprehensive metabolic panel      Glenetta Hew, M.D., M.S. Interventional Cardiologist   Pager # [phone] Phone # [phone] [address], [place] [address]

## 2015-11-27 ENCOUNTER — Ambulatory Visit (INDEPENDENT_AMBULATORY_CARE_PROVIDER_SITE_OTHER): Payer: Medicare HMO | Admitting: Cardiology

## 2015-11-27 ENCOUNTER — Encounter: Payer: Self-pay | Admitting: Cardiology

## 2015-11-27 VITALS — BP 116/63 | HR 52 | Ht 69.0 in | Wt 176.6 lb

## 2015-11-27 DIAGNOSIS — I2510 Atherosclerotic heart disease of native coronary artery without angina pectoris: Secondary | ICD-10-CM

## 2015-11-27 DIAGNOSIS — E785 Hyperlipidemia, unspecified: Secondary | ICD-10-CM | POA: Diagnosis not present

## 2015-11-27 DIAGNOSIS — I255 Ischemic cardiomyopathy: Secondary | ICD-10-CM

## 2015-11-27 DIAGNOSIS — Z79899 Other long term (current) drug therapy: Secondary | ICD-10-CM

## 2015-11-27 DIAGNOSIS — Z9861 Coronary angioplasty status: Secondary | ICD-10-CM

## 2015-11-27 DIAGNOSIS — I2119 ST elevation (STEMI) myocardial infarction involving other coronary artery of inferior wall: Secondary | ICD-10-CM

## 2015-11-27 DIAGNOSIS — R600 Localized edema: Secondary | ICD-10-CM

## 2015-11-27 DIAGNOSIS — I10 Essential (primary) hypertension: Secondary | ICD-10-CM

## 2015-11-27 MED ORDER — EZETIMIBE-SIMVASTATIN 10-40 MG PO TABS
1.0000 | ORAL_TABLET | ORAL | Status: DC
Start: 2015-11-27 — End: 2015-12-04

## 2015-11-27 NOTE — Patient Instructions (Addendum)
Dr. Ellyn Hack has recommended that you take VYTORIN 10/40 mg EVERY OTHER DAY  Please pick up the prescription for a diuretic/fluid pill that was prescribed by your primary care MD. Please call our office to let us know what that medication was so we can update our records.   Your physician recommends that you return for lab work FASTING - lipid, CMET You can have the labs done in [place]  Dr. Ellyn Hack has recommended compression stockings   Your physician wants you to follow-up in: December 2017. You will receive a reminder letter in the mail two months in advance. If you don't receive a letter, please call our office to schedule the follow-up appointment.  How to Use Compression Stockings Compression stockings are elastic socks that squeeze the legs. They help to increase blood flow to the legs, decrease swelling in the legs, and reduce the chance of developing blood clots in the lower legs. Compression stockings are often used by people who:  Are recovering from surgery.  Have poor circulation in their legs.  Are prone to getting blood clots in their legs.  Have varicose veins.  Sit or stay in bed for long periods of time. HOW TO USE COMPRESSION STOCKINGS Before you put on your compression stockings:  Make sure that they are the correct size. If you do not know your size, ask your health care provider.  Make sure that they are clean, dry, and in good condition.  Check them for rips and tears. Do not put them on if they are ripped or torn. Put your stockings on first thing in the morning, before you get out of bed. Keep them on for as long as your health care provider advises. When you are wearing your stockings:  Keep them as smooth as possible. Do not allow them to bunch up. It is especially important to prevent the stockings from bunching up around your toes or behind your knees.  Do not roll the stockings downward and leave them rolled down. This can decrease blood flow to  your leg.  Change them right away if they become wet or dirty. When you take off your stockings, inspect your legs and feet. Anything that does not seem normal may require medical attention. Look for:  Open sores.  Red spots.  Swelling. INFORMATION AND TIPS  Do not stop wearing your compression stockings without talking to your health care provider first.  Wash your stockings everyday with mild detergent in cold or warm water. Do not use bleach. Air-dry your stockings or dry them in a clothes dryer on low heat.  Replace your stockings every 3-6 months.  If skin moisturizing is part of your treatment plan, apply lotion or cream at night so that your skin will be dry when you put on the stockings in the morning. It is harder to put the stockings on when you have lotion on your legs or feet. SEEK MEDICAL CARE IF: Remove your stockings and seek medical care if:  You have a feeling of pins and needles in your feet or legs.  You have any new changes in your skin.  You have skin lesions that are getting worse.  You have swelling or pain that is getting worse. SEEK IMMEDIATE MEDICAL CARE IF:  You have numbness or tingling in your lower legs that does not get better immediately after you take the stockings off.  Your toes or feet become cold and blue.  You develop open sores or red spots on your  legs that do not go away.  You see or feel a warm spot on your leg.  You have new swelling or soreness in your leg.  You are short of breath or you have chest pain for no reason.  You have a rapid or irregular heartbeat.  You feel light-headed or dizzy.   This information is not intended to replace advice given to you by your health care provider. Make sure you discuss any questions you have with your health care provider.   Document Released: 03/20/2009 Document Revised: 10/07/2014 Document Reviewed: 04/30/2014 Elsevier Interactive Patient Education [employer].

## 2015-11-28 ENCOUNTER — Encounter: Payer: Self-pay | Admitting: Cardiology

## 2015-11-28 NOTE — Assessment & Plan Note (Addendum)
Overall active without significant symptoms. Now has stents in the RCA as well as circumflex. Both are DES. Had breathing issues with Brilinta, therefore back on aspirin and Plavix. On low-dose of both beta blocker and ACE inhibitor. He has a statin but is not taking it routinely. I recommend that he take it every day.

## 2015-11-28 NOTE — Assessment & Plan Note (Signed)
Clearly by echocardiogram he has inferolateral hypokinesis/akinesis on echo, but his EF has improved. We would look like his distal circumflex cover large distribution, and this continued damage done by his original MI of the RCA. No recurrent angina or heart failure symptoms.

## 2015-11-28 NOTE — Assessment & Plan Note (Signed)
An echocardiogram was checked a little bit soon I would've anticipated, however it did show improvement in EF up to a moderate reduction. Wall motion abnormality is as per expected from his MIs. I suspect he has some collateral ischemia in the prior infarct area as well. Not having active heart physicians, although he does have some edema doesn't seem to be related to PND or orthopnea and therefore not likely related to left heart failure. He is on ACE inhibitor and beta blocker and likely being started on diuretic.

## 2015-11-28 NOTE — Assessment & Plan Note (Signed)
He had been on HCTZ before. But no longer on it. He says he is on some type of fluid pill which I think may be Lasix. I wanted to get that filled.  He really doesn't have other existing heart failure symptoms of PND or orthopnea to go the edema. Probably more from venous stasis then from CHF.  As I have always done, i.e. recommend compression stockings. I given a prescription for compression stockings.

## 2015-11-28 NOTE — Assessment & Plan Note (Addendum)
His labs from January were not quite as good as I would like to be. He is not taking Vytorin every day 1 to increase to every day. There've been talked to him being on Crestor in the past, but this was switched back to Vytorin because of insurance issues. Recheck labs in the next month or so since it is now 6 months out.

## 2015-11-28 NOTE — Assessment & Plan Note (Signed)
Well-controlled on current regimen. ?

## 2015-12-04 ENCOUNTER — Telehealth: Payer: Self-pay | Admitting: *Deleted

## 2015-12-04 MED ORDER — SIMVASTATIN 40 MG PO TABS
40.0000 mg | ORAL_TABLET | ORAL | Status: DC
Start: 2015-12-04 — End: 2016-11-05

## 2015-12-04 MED ORDER — EZETIMIBE 10 MG PO TABS: 10.0000 mg | ORAL_TABLET | ORAL | Status: DC

## 2015-12-04 NOTE — Telephone Encounter (Signed)
Needs prior authorization FOR COMBINATION - EZETIMIBE-SIMVASTATIN 10/40  WILL SEPARATE MEDICATION TO SEE IF MEDICATION CAN BE GIVEN WITHOUT PRIOR AUTHORIZATION  Called and notify patient of the change with medication that in the future will be taking 2 separate pills for his cholesterol. Patient would like 30 day supply. Patient verbalized understanding.

## 2016-01-13 ENCOUNTER — Other Ambulatory Visit: Payer: Self-pay | Admitting: Cardiology

## 2016-01-14 LAB — COMPREHENSIVE METABOLIC PANEL
ALT: 15 IU/L (ref 0–44)
AST: 26 IU/L (ref 0–40)
Albumin/Globulin Ratio: 1.6 (ref 1.2–2.2)
Albumin: 3.9 g/dL (ref 3.5–4.8)
Alkaline Phosphatase: 56 IU/L (ref 39–117)
BUN/Creatinine Ratio: 12 (ref 10–24)
BUN: 16 mg/dL (ref 8–27)
Bilirubin Total: 0.6 mg/dL (ref 0.0–1.2)
CO2: 26 mmol/L (ref 18–29)
Calcium: 9 mg/dL (ref 8.6–10.2)
Chloride: 104 mmol/L (ref 96–106)
Creatinine, Ser: 1.31 mg/dL — ABNORMAL HIGH (ref 0.76–1.27)
GFR calc Af Amer: 63 mL/min/{1.73_m2} (ref >59)
GFR calc non Af Amer: 54 mL/min/{1.73_m2} — ABNORMAL LOW (ref >59)
Globulin, Total: 2.5 g/dL (ref 1.5–4.5)
Glucose: 102 mg/dL — ABNORMAL HIGH (ref 65–99)
Potassium: 4.6 mmol/L (ref 3.5–5.2)
Sodium: 141 mmol/L (ref 134–144)
Total Protein: 6.4 g/dL (ref 6.0–8.5)

## 2016-01-14 LAB — LIPID PANEL W/O CHOL/HDL RATIO
Cholesterol, Total: 140 mg/dL (ref 100–199)
HDL: 30 mg/dL — ABNORMAL LOW (ref >39)
LDL Calculated: 69 mg/dL (ref 0–99)
Triglycerides: 203 mg/dL — ABNORMAL HIGH (ref 0–149)
VLDL Cholesterol Cal: 41 mg/dL — ABNORMAL HIGH (ref 5–40)

## 2016-03-08 ENCOUNTER — Encounter: Payer: Self-pay | Admitting: Gastroenterology

## 2016-03-08 ENCOUNTER — Ambulatory Visit (INDEPENDENT_AMBULATORY_CARE_PROVIDER_SITE_OTHER): Payer: Medicare HMO | Admitting: Gastroenterology

## 2016-03-08 ENCOUNTER — Ambulatory Visit (HOSPITAL_COMMUNITY)
Admission: RE | Admit: 2016-03-08 | Discharge: 2016-03-08 | Disposition: A | Discharge status: Home / Self Care | Payer: Medicare HMO | Source: Ambulatory Visit | Attending: Gastroenterology | Admitting: Gastroenterology

## 2016-03-08 VITALS — BP 129/71 | HR 71 | Temp 97.8°F | Ht 69.0 in | Wt 171.4 lb

## 2016-03-08 DIAGNOSIS — K219 Gastro-esophageal reflux disease without esophagitis: Secondary | ICD-10-CM | POA: Insufficient documentation

## 2016-03-08 DIAGNOSIS — R1319 Other dysphagia: Secondary | ICD-10-CM

## 2016-03-08 DIAGNOSIS — R053 Chronic cough: Secondary | ICD-10-CM

## 2016-03-08 DIAGNOSIS — R05 Cough: Secondary | ICD-10-CM

## 2016-03-08 DIAGNOSIS — R1310 Dysphagia, unspecified: Secondary | ICD-10-CM | POA: Insufficient documentation

## 2016-03-08 NOTE — Patient Instructions (Signed)
1. Please have your chest xray done. 2. We will call you with further instructions once your results are reviewed.

## 2016-03-08 NOTE — Progress Notes (Signed)
Primary Care Physician: Jani Gravel, MD . Primary Gastroenterologist:  Garfield Cornea, MD   Chief Complaint  Patient presents with  . Gastroesophageal Reflux    HPI: [name] is a 70 y.o. male here for further evaluation of ?GERD. Patient has h/o GERD. Previously has been on pantoprazole when we saw him in 2015 (last visit). 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   Patient hospitalized in 06/2015 due to STEMI. Treated with DES. Plavix changed to Brilinta but patient did not tolerate due to SOB and too costly. Went back to Plavix. He was started on Lisinopril shortly after that hospitalization.   Patient states he has had a lot of coughing. 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 Symptoms worse the last 3-4 weeks. Recent Abx helped some. Nocturnal cough is worse. Clears throat sometimes with meals. Rare heartburn and usually food related. Problems swallowing meats, few times the meat comes back up. Has to try and wash meat down. No abd pain. Some early satiety. BM regular. No melena, brbpr. No prior EGD.     Current Outpatient Prescriptions  Medication Sig Dispense Refill  . aspirin EC 81 MG tablet Take 81 mg by mouth at bedtime.    . Cholecalciferol (D3-1000) 1000 UNITS capsule Take 1,000 Units by mouth 2 (two) times a week.      . clopidogrel (PLAVIX) 75 MG tablet Take 1 tablet (75 mg total) by mouth daily. 90 tablet 3  . Cyanocobalamin (VITAMIN B-12) 2500 MCG SUBL Place 1 tablet under the tongue once a week. Reported on 06/25/2015    . ferrous sulfate 325 (65 FE) MG tablet Take 325 mg by mouth 3 (three) times a week.     Marland Kitchen FOLIC ACID PO Take 1 tablet by mouth 3 (three) times a week.    Marland Kitchen L-Arginine 500 MG CAPS Take 1 capsule by mouth every 30 (thirty) days. Reported on 06/25/2015    . lisinopril (PRINIVIL,ZESTRIL) 2.5 MG tablet Take 1 tablet (2.5 mg total) by mouth daily. 90 tablet 3  . metFORMIN (GLUCOPHAGE) 500 MG tablet Take 250 mg by mouth 2 (two) times  daily with a meal.    . metoprolol succinate (TOPROL XL) 25 MG 24 hr tablet Take 1 tablet (25 mg total) by mouth daily. 90 tablet 3  . Multiple Vitamins-Minerals (ZINC PO) Take 1 tablet by mouth every 30 (thirty) days.    . nitroGLYCERIN (NITROSTAT) 0.4 MG SL tablet Place 1 tablet (0.4 mg total) under the tongue every 5 (five) minutes as needed for chest pain. 25 tablet 3  . OVER THE COUNTER MEDICATION Take 1 tablet by mouth 3 (three) times a week. Reported on 06/25/2015    . pantoprazole (PROTONIX) 40 MG tablet Take 1 tablet (40 mg total) by mouth daily. 30 tablet 5  . pyridoxine (B-6) 100 MG tablet Take 100 mg by mouth 3 (three) times a week. Reported on 06/25/2015    . simvastatin (ZOCOR) 40 MG tablet Take 1 tablet (40 mg total) by mouth every other day. 15 tablet 11  . VITAMIN A PO Take 1 capsule by mouth 3 (three) times a week.    . vitamin C (ASCORBIC ACID) 500 MG tablet Take 500 mg by mouth 3 (three) times a week.      No current facility-administered medications for this visit.     Allergies as of 03/08/2016  . (No Known Allergies)   Past Medical History:  Diagnosis Date  .  Acute MI, inferolateral wall, initial episode of care ([place]) 06/16/15   99% dCx   . BPH (benign prostatic hyperplasia) 04/08/2011  . CAD S/P percutaneous coronary angioplasty 11/23/12; 06/16/15   a. 2 overlapping mRCA lesions - Xience Xpedition DES 2.75 mm x 18 mm (3.0 mm);; b. Inf-Lat STEMI 06/16/2015 - dCx Asp Thrombectomy & PCI 3.0 x 15 Xience drDES (3.6 mm)  . Chronic headache   . CLL (chronic lymphocytic leukemia) ([place]) 04/08/2011  . Depression   . Diverticulitis   . Gallstone   . GERD (gastroesophageal reflux disease)   . Hypercholesteremia   . Ischemic cardiomyopathy    Echo 06/18/2015 EF 35-40% after lateral MI -> repeat echo 08/06/2015: EF 40-45% with basal-mid inferolateral akinesis and hypokinesis of the lateral wall.  . NSTEMI (non-ST elevated myocardial infarction) ([place]) 11/23/12   NSTEMI 11/23/2012 RCA  lesion - PCI with DES; moderate LAD disease  . PTSD (post-traumatic stress disorder)    With Depression - since MI   Past Surgical History:  Procedure Laterality Date  . APPENDECTOMY  1968-70  . CARDIAC CATHETERIZATION N/A 06/16/2015   Procedure: Left Heart Cath and Coronary Angiography;  Surgeon: Jettie Booze, MD;  Location: [place] [facility];  Service: Cardiovascular: 99% thrombotic dCx -> asp thrombectomy & PCI. RCA Stents Patent.   Marland Kitchen CARDIAC CATHETERIZATION N/A 06/16/2015   Procedure: Coronary Stent Intervention;  Surgeon: Jettie Booze, MD;  Location: [place] [facility];  Service: Cardiovascular: PCI dCx = thrombectomy -> 3.0 x 15 Xience drug-eluting stent (3.6 mm)   . COLONOSCOPY  2008   [id number] diverticula, diminutive polyp in the cecum, s/p bx Normal rectum. Tubular adenoma  . COLONOSCOPY N/A 10/17/2012   Dr. Gala Romney: colonic diverticulosis, tubular adenoma, surveillance due May 2019  . CORONARY STENT PLACEMENT  11/23/12   Mid RCA -- Xience eXp - 2.75 mm x 18 mm DES (3.25mm) , Dr. Ellyn Hack  . LEFT HEART CATHETERIZATION WITH CORONARY ANGIOGRAM N/A 11/23/2012   Procedure: LEFT HEART CATHETERIZATION WITH CORONARY ANGIOGRAM;  Surgeon: Leonie Man, MD;  Location: [facility];  Service: Cardiovascular: NSTEMI: Culprit = mid RCA 95% & 75%; LAD ~40-50%, Small RI ~60%; EF ~60%, mild inf-basal HK     . TRANSTHORACIC ECHOCARDIOGRAM  06/18/2015   a. EF 35-40%. Entire inferior hypokinesis and akinesis of the basal and mid inferolateral and anterolateral /distal lateral walls;; b. EF 40 and 45%. Akinesis of basal-mid inferolateral wall. Hypokinesis of entire lateral wall.   Family History  Problem Relation Age of Onset  . Diabetes Mother   . Cancer Father   . Colon cancer Neg Hx    Social History   Social History  . Marital status: Married    Spouse name: N/A  . Number of children: N/A  . Years of education: N/A   Social History Main Topics  . Smoking status: Former  Smoker    Packs/day: 0.25    Years: 2.00    Types: Cigarettes    Quit date: 06/07/1963  . Smokeless tobacco: Never Used  . Alcohol use No     Comment: Occasionally drinks red wine  . Drug use: No  . Sexual activity: Not Asked   Other Topics Concern  . None   Social History Narrative   Currently in Cardiac Rehabilitation   "former smoker". Does not drink EtOH   Married.    ROS:  General: Negative for anorexia, weight loss, fever, chills, fatigue, weakness. ENT: Negative for hoarseness, difficulty swallowing , nasal  congestion. CV: Negative for chest pain, angina, palpitations, dyspnea on exertion, peripheral edema.  Respiratory: Negative for dyspnea at rest, dyspnea on exertion,  sputum, wheezing. See hpi GI: See history of present illness. GU:  Negative for dysuria, hematuria, urinary incontinence, urinary frequency, nocturnal urination.  Endo: Negative for unusual weight change.    Physical Examination:   BP 129/71   Pulse 71   Temp 97.8 F (36.6 C) (Oral)   Ht 5\' 9"  (1.753 m)   Wt 171 lb 6.4 oz (77.7 kg)   BMI 25.31 kg/m   General: Well-nourished, well-developed in no acute distress.  Eyes: No icterus. Mouth: Oropharyngeal mucosa moist and pink , no lesions erythema or exudate. Lungs: Clear to auscultation bilaterally.  Heart: Regular rate and rhythm, no murmurs rubs or gallops.  Abdomen: Bowel sounds are normal, nontender, nondistended, no hepatosplenomegaly or masses, no abdominal bruits or hernia , no rebound or guarding.   Extremities: No lower extremity edema. No clubbing or deformities. Neuro: Alert and oriented x 4   Skin: Warm and dry, no jaundice.   Psych: Alert and cooperative, normal mood and affect.  Labs:  Lab Results  Component Value Date   WBC 46.3 (H) 06/22/2015   HGB 14.6 06/22/2015   HCT 47.1 06/22/2015   MCV 95.7 06/22/2015   PLT 197 06/22/2015   Lab Results  Component Value Date   CREATININE 1.31 (H) 01/13/2016   BUN 16 01/13/2016    NA 141 01/13/2016   K 4.6 01/13/2016   CL 104 01/13/2016   CO2 26 01/13/2016   Lab Results  Component Value Date   ALT 15 01/13/2016   AST 26 01/13/2016   ALKPHOS 56 01/13/2016   BILITOT 0.6 01/13/2016   No results found for: LIPASE  Imaging Studies: No results found.

## 2016-03-10 NOTE — Assessment & Plan Note (Signed)
Chronic cough with worsening of symptoms. Nocturnal coughing becoming debilitating. He has some chronic GERD, has developed solid food esophageal dysphagia and early satiety. No prior EGD. Symptoms on PPI. Will likely offer EGD +/-ED in near future. Patient is on plavix with most recent DES 06/2015.   Regarding cough, cannot rule out chronic lung disease, side effect from lisinopril (started within the past six months), malignancy. Obtain CXR. Further recommendations to follow.

## 2016-03-11 NOTE — Progress Notes (Signed)
CC'ED TO PCP 

## 2016-03-14 NOTE — Progress Notes (Signed)
Please let patient know his CXR was unremarkable.  I am concerned that his cough could be secondary to lisinopril started earlier this year. Please ask him to discuss with prescribing doctor to see if he can be switched to alternative to see if cough goes away. In the interim, increase pantoprazole 40mg  bid before meals, #60, 1 refill.  Ov with RMR in 4-6 weeks.

## 2016-03-16 ENCOUNTER — Encounter: Payer: Self-pay | Admitting: Internal Medicine

## 2016-03-16 ENCOUNTER — Other Ambulatory Visit: Payer: Self-pay | Admitting: Gastroenterology

## 2016-03-16 MED ORDER — PANTOPRAZOLE SODIUM 40 MG PO TBEC
40.0000 mg | DELAYED_RELEASE_TABLET | Freq: Two times a day (BID) | ORAL | 1 refills | Status: DC
Start: 2016-03-16 — End: 2016-05-11

## 2016-04-12 ENCOUNTER — Other Ambulatory Visit: Payer: Self-pay

## 2016-04-12 ENCOUNTER — Ambulatory Visit (INDEPENDENT_AMBULATORY_CARE_PROVIDER_SITE_OTHER): Payer: Medicare HMO | Admitting: Internal Medicine

## 2016-04-12 ENCOUNTER — Encounter: Payer: Self-pay | Admitting: Internal Medicine

## 2016-04-12 VITALS — BP 125/70 | HR 53 | Temp 97.4°F | Ht 69.0 in | Wt 172.0 lb

## 2016-04-12 DIAGNOSIS — R1310 Dysphagia, unspecified: Secondary | ICD-10-CM

## 2016-04-12 DIAGNOSIS — K219 Gastro-esophageal reflux disease without esophagitis: Secondary | ICD-10-CM | POA: Diagnosis not present

## 2016-04-12 DIAGNOSIS — R1319 Other dysphagia: Secondary | ICD-10-CM

## 2016-04-12 NOTE — Progress Notes (Signed)
e

## 2016-04-12 NOTE — Patient Instructions (Signed)
Continue protonix 40 mg twice daily  Barium pill esophagram - esophageal dysphagia  Office visit 3 months

## 2016-04-12 NOTE — Progress Notes (Signed)
Primary Care Physician:  Jani Gravel, MD Primary Gastroenterologist:  Dr. Gala Romney  Pre-Procedure History & Physical: HPI:  [name] is a 70 y.o. male here for follow-up of GERD, chronic cough and dysphagia. Patient states have intermittent esophageal  dysphagia to solids and pills. Nonprogressive. Typical reflux symptoms well controlled with Protonix 40 mg twice a day. Patient states cough has notably improved since last office visit here.  Chest x-ray negative. He continues on Plavix. He is unsure whether or not Plavix will be withdrawn from his regimen in January or not. No prior luminal imaging of this upper GI tract.  Past Medical History:  Diagnosis Date  . Acute MI, inferolateral wall, initial episode of care ([place]) 06/16/15   99% dCx   . BPH (benign prostatic hyperplasia) 04/08/2011  . CAD S/P percutaneous coronary angioplasty 11/23/12; 06/16/15   a. 2 overlapping mRCA lesions - Xience Xpedition DES 2.75 mm x 18 mm (3.0 mm);; b. Inf-Lat STEMI 06/16/2015 - dCx Asp Thrombectomy & PCI 3.0 x 15 Xience drDES (3.6 mm)  . Chronic headache   . CLL (chronic lymphocytic leukemia) ([place]) 04/08/2011  . Depression   . Diverticulitis   . Gallstone   . GERD (gastroesophageal reflux disease)   . Hypercholesteremia   . Ischemic cardiomyopathy    Echo 06/18/2015 EF 35-40% after lateral MI -> repeat echo 08/06/2015: EF 40-45% with basal-mid inferolateral akinesis and hypokinesis of the lateral wall.  . NSTEMI (non-ST elevated myocardial infarction) ([place]) 11/23/12   NSTEMI 11/23/2012 RCA lesion - PCI with DES; moderate LAD disease  . PTSD (post-traumatic stress disorder)    With Depression - since MI    Past Surgical History:  Procedure Laterality Date  . APPENDECTOMY  1968-70  . CARDIAC CATHETERIZATION N/A 06/16/2015   Procedure: Left Heart Cath and Coronary Angiography;  Surgeon: Jettie Booze, MD;  Location: [place] [facility];  Service: Cardiovascular: 99% thrombotic dCx -> asp thrombectomy  & PCI. RCA Stents Patent.   Marland Kitchen CARDIAC CATHETERIZATION N/A 06/16/2015   Procedure: Coronary Stent Intervention;  Surgeon: Jettie Booze, MD;  Location: [place] [facility];  Service: Cardiovascular: PCI dCx = thrombectomy -> 3.0 x 15 Xience drug-eluting stent (3.6 mm)   . COLONOSCOPY  2008   [id number] diverticula, diminutive polyp in the cecum, s/p bx Normal rectum. Tubular adenoma  . COLONOSCOPY N/A 10/17/2012   Dr. Gala Romney: colonic diverticulosis, tubular adenoma, surveillance due May 2019  . CORONARY STENT PLACEMENT  11/23/12   Mid RCA -- Xience eXp - 2.75 mm x 18 mm DES (3.14mm) , Dr. Ellyn Hack  . LEFT HEART CATHETERIZATION WITH CORONARY ANGIOGRAM N/A 11/23/2012   Procedure: LEFT HEART CATHETERIZATION WITH CORONARY ANGIOGRAM;  Surgeon: Leonie Man, MD;  Location: [facility] CATH LAB;  Service: Cardiovascular: NSTEMI: Culprit = mid RCA 95% & 75%; LAD ~40-50%, Small RI ~60%; EF ~60%, mild inf-basal HK     . TRANSTHORACIC ECHOCARDIOGRAM  06/18/2015   a. EF 35-40%. Entire inferior hypokinesis and akinesis of the basal and mid inferolateral and anterolateral /distal lateral walls;; b. EF 40 and 45%. Akinesis of basal-mid inferolateral wall. Hypokinesis of entire lateral wall.    Prior to Admission medications   Medication Sig Start Date End Date Taking? Authorizing Provider  aspirin EC 81 MG tablet Take 81 mg by mouth as needed.    Yes Historical Provider, MD  Cholecalciferol (D3-1000) 1000 UNITS capsule Take 1,000 Units by mouth 2 (two) times a week.     Yes  Historical Provider, MD  clopidogrel (PLAVIX) 75 MG tablet Take 1 tablet (75 mg total) by mouth daily. 09/11/15  Yes Leonie Man, MD  Cyanocobalamin (VITAMIN B-12) 2500 MCG SUBL Place 1 tablet under the tongue once a week. Reported on 06/25/2015   Yes Historical Provider, MD  ferrous sulfate 325 (65 FE) MG tablet Take 325 mg by mouth 3 (three) times a week.    Yes Historical Provider, MD  FOLIC ACID PO Take 1 tablet by mouth 3 (three) times a  week.   Yes Historical Provider, MD  hydrochlorothiazide (HYDRODIURIL) 12.5 MG tablet Take 12.5 mg by mouth as needed. 03/26/16  Yes Historical Provider, MD  L-Arginine 500 MG CAPS Take 1 capsule by mouth every 30 (thirty) days. Reported on 06/25/2015   Yes Historical Provider, MD  lisinopril (PRINIVIL,ZESTRIL) 2.5 MG tablet Take 1 tablet (2.5 mg total) by mouth daily. 06/18/15  Yes Almyra Deforest, PA  metFORMIN (GLUCOPHAGE) 500 MG tablet Take 250 mg by mouth 2 (two) times daily with a meal.   Yes Historical Provider, MD  metoprolol succinate (TOPROL XL) 25 MG 24 hr tablet Take 1 tablet (25 mg total) by mouth daily. 06/18/15  Yes Almyra Deforest, PA  Multiple Vitamins-Minerals (ZINC PO) Take 1 tablet by mouth every 30 (thirty) days.   Yes Historical Provider, MD  pantoprazole (PROTONIX) 40 MG tablet Take 1 tablet (40 mg total) by mouth daily. 06/08/13  Yes Angus McInnis, MD  pyridoxine (B-6) 100 MG tablet Take 100 mg by mouth 3 (three) times a week. Reported on 06/25/2015   Yes Historical Provider, MD  simvastatin (ZOCOR) 40 MG tablet Take 1 tablet (40 mg total) by mouth every other day. 12/04/15  Yes Leonie Man, MD  VITAMIN A PO Take 1 capsule by mouth 3 (three) times a week.   Yes Historical Provider, MD  vitamin C (ASCORBIC ACID) 500 MG tablet Take 500 mg by mouth 3 (three) times a week.    Yes Historical Provider, MD  nitroGLYCERIN (NITROSTAT) 0.4 MG SL tablet Place 1 tablet (0.4 mg total) under the tongue every 5 (five) minutes as needed for chest pain. Patient not taking: Reported on 04/12/2016 09/30/15   Leonie Man, MD  OVER THE COUNTER MEDICATION Take 1 tablet by mouth 3 (three) times a week. Reported on 06/25/2015    Historical Provider, MD  pantoprazole (PROTONIX) 40 MG tablet Take 1 tablet (40 mg total) by mouth 2 (two) times daily. Patient not taking: Reported on 04/12/2016 03/16/16   Mahala Menghini, PA-C    Allergies as of 04/12/2016  . (No Known Allergies)    Family History  Problem Relation  Age of Onset  . Diabetes Mother   . Cancer Father   . Colon cancer Neg Hx     Social History   Social History  . Marital status: Married    Spouse name: N/A  . Number of children: N/A  . Years of education: N/A   Occupational History  . Not on file.   Social History Main Topics  . Smoking status: Former Smoker    Packs/day: 0.25    Years: 2.00    Types: Cigarettes    Quit date: 06/07/1963  . Smokeless tobacco: Never Used  . Alcohol use No     Comment: Occasionally drinks red wine  . Drug use: No  . Sexual activity: Not on file   Other Topics Concern  . Not on file   Social History Narrative  Currently in Cardiac Rehabilitation   "former smoker". Does not drink EtOH   Married.    Review of Systems: See HPI, otherwise negative ROS  Physical Exam: BP 125/70   Pulse (!) 53   Temp 97.4 F (36.3 C) (Oral)   Ht 5\' 9"  (1.753 m)   Wt 172 lb (78 kg)   BMI 25.40 kg/m  General:   Alert,  Well-developed, well-nourished, pleasant and cooperative in NAD Abdomen: Non-distended, normal bowel sounds.  Soft and nontender without appreciable mass or hepatosplenomegaly.  Pulses:  Normal pulses noted. Extremities:  Without clubbing or edema.  Impression:  GERD well controlled on twice a day Protonix. Cough seemingly has improved. Esophageal dysphagia persists. He continues on Plavix.  We need to look into his dysphagia further. If esophageal dilation needed, would be best to wait till he can come off of Plavix.  History of colonic adenoma-due for surveillance examination 2019.   Recommendations:  Continue protonix 40 mg twice daily  Barium pill esophagram - esophageal dysphagia  Office visit 3 months      Notice: This dictation was prepared with Dragon dictation along with smaller phrase technology. Any transcriptional errors that result from this process are unintentional and may not be corrected upon review.

## 2016-04-19 ENCOUNTER — Ambulatory Visit (HOSPITAL_COMMUNITY)
Admission: RE | Admit: 2016-04-19 | Discharge: 2016-04-19 | Disposition: A | Discharge status: Home / Self Care | Payer: Medicare HMO | Source: Ambulatory Visit | Attending: Internal Medicine | Admitting: Internal Medicine

## 2016-04-19 DIAGNOSIS — K449 Diaphragmatic hernia without obstruction or gangrene: Secondary | ICD-10-CM | POA: Insufficient documentation

## 2016-04-19 DIAGNOSIS — R1310 Dysphagia, unspecified: Secondary | ICD-10-CM | POA: Diagnosis not present

## 2016-04-20 ENCOUNTER — Ambulatory Visit (INDEPENDENT_AMBULATORY_CARE_PROVIDER_SITE_OTHER): Payer: Medicare HMO | Admitting: Urology

## 2016-04-20 DIAGNOSIS — N401 Enlarged prostate with lower urinary tract symptoms: Secondary | ICD-10-CM

## 2016-04-20 DIAGNOSIS — R972 Elevated prostate specific antigen [PSA]: Secondary | ICD-10-CM

## 2016-04-20 DIAGNOSIS — R351 Nocturia: Secondary | ICD-10-CM

## 2016-04-25 ENCOUNTER — Other Ambulatory Visit (HOSPITAL_COMMUNITY): Payer: Self-pay | Admitting: *Deleted

## 2016-04-25 DIAGNOSIS — C9110 Chronic lymphocytic leukemia of B-cell type not having achieved remission: Secondary | ICD-10-CM

## 2016-04-27 ENCOUNTER — Encounter (HOSPITAL_COMMUNITY): Payer: Medicare HMO

## 2016-04-27 ENCOUNTER — Encounter (HOSPITAL_COMMUNITY): Payer: Medicare HMO | Attending: Hematology & Oncology | Admitting: Hematology & Oncology

## 2016-04-27 ENCOUNTER — Encounter (HOSPITAL_COMMUNITY): Payer: Self-pay | Admitting: Hematology & Oncology

## 2016-04-27 VITALS — BP 139/58 | HR 51 | Temp 97.9°F | Resp 16 | Wt 172.6 lb

## 2016-04-27 DIAGNOSIS — C9110 Chronic lymphocytic leukemia of B-cell type not having achieved remission: Secondary | ICD-10-CM

## 2016-04-27 LAB — CBC WITH DIFFERENTIAL/PLATELET
Basophils Absolute: 0 10*3/uL (ref 0.0–0.1)
Basophils Relative: 0 %
Eosinophils Absolute: 0.5 10*3/uL (ref 0.0–0.7)
Eosinophils Relative: 1 %
HCT: 46.6 % (ref 39.0–52.0)
Hemoglobin: 14.4 g/dL (ref 13.0–17.0)
Lymphocytes Relative: 93 %
Lymphs Abs: 47.3 10*3/uL — ABNORMAL HIGH (ref 0.7–4.0)
MCH: 30.1 pg (ref 26.0–34.0)
MCHC: 30.9 g/dL (ref 30.0–36.0)
MCV: 97.5 fL (ref 78.0–100.0)
Monocytes Absolute: 0.5 10*3/uL (ref 0.1–1.0)
Monocytes Relative: 1 %
Neutro Abs: 2.5 10*3/uL (ref 1.7–7.7)
Neutrophils Relative %: 5 %
Platelets: 160 10*3/uL (ref 150–400)
RBC: 4.78 MIL/uL (ref 4.22–5.81)
RDW: 14.4 % (ref 11.5–15.5)
WBC: 50.8 10*3/uL (ref 4.0–10.5)

## 2016-04-27 LAB — COMPREHENSIVE METABOLIC PANEL
ALT: 12 U/L — ABNORMAL LOW (ref 17–63)
AST: 25 U/L (ref 15–41)
Albumin: 4 g/dL (ref 3.5–5.0)
Alkaline Phosphatase: 45 U/L (ref 38–126)
Anion gap: 5 (ref 5–15)
BUN: 14 mg/dL (ref 6–20)
CO2: 28 mmol/L (ref 22–32)
Calcium: 9.2 mg/dL (ref 8.9–10.3)
Chloride: 108 mmol/L (ref 101–111)
Creatinine, Ser: 1.35 mg/dL — ABNORMAL HIGH (ref 0.61–1.24)
GFR calc Af Amer: 59 mL/min — ABNORMAL LOW (ref >60)
GFR calc non Af Amer: 51 mL/min — ABNORMAL LOW (ref >60)
Glucose, Bld: 111 mg/dL — ABNORMAL HIGH (ref 65–99)
Potassium: 4.2 mmol/L (ref 3.5–5.1)
Sodium: 141 mmol/L (ref 135–145)
Total Bilirubin: 0.9 mg/dL (ref 0.3–1.2)
Total Protein: 6.7 g/dL (ref 6.5–8.1)

## 2016-04-27 LAB — LACTATE DEHYDROGENASE: LDH: 126 U/L (ref 98–192)

## 2016-04-27 NOTE — Patient Instructions (Addendum)
[place] at [facility] Discharge Instructions  RECOMMENDATIONS MADE BY THE CONSULTANT AND ANY TEST RESULTS WILL BE SENT TO YOUR REFERRING PHYSICIAN.  You saw Dr.Penland today. Return in 6 months to see [place] with labs See Amy at checkout for appointments.  Thank you for choosing [place] at [facility] to provide your oncology and hematology care.  To afford each patient quality time with our provider, please arrive at least 15 minutes before your scheduled appointment time.   Beginning January 23rd 2017 lab work for the [facility] will be done in the  Main lab at [facility] on 1st floor. If you have a lab appointment with the [facility] please come in thru the  Main Entrance and check in at the main information desk  You need to re-schedule your appointment should you arrive 10 or more minutes late.  We strive to give you quality time with our providers, and arriving late affects you and other patients whose appointments are after yours.  Also, if you no show three or more times for appointments you may be dismissed from the clinic at the providers discretion.     Again, thank you for choosing [facility].  Our hope is that these requests will decrease the amount of time that you wait before being seen by our physicians.       _____________________________________________________________  Should you have questions after your visit to [facility], please contact our office at [phone] between the hours of 8:30 a.m. and 4:30 p.m.  Voicemails left after 4:30 p.m. will not be returned until the following business day.  For prescription refill requests, have your pharmacy contact our office.         Resources For Cancer Patients and their Caregivers ? American Cancer Society: Can assist with transportation, wigs, general needs, runs Look Good Feel Better.        507-777-0595 ? Cancer Care: Provides  financial assistance, online support groups, medication/co-pay assistance.  1-800-813-HOPE 856-669-9192) ? [place] [facility] Assists [place] cancer patients and their families through emotional , educational and financial support.  (918) 774-1217 ? [place] DSS Where to apply for food stamps, Medicaid and utility assistance. 207-608-7260 ? [facility]: Transportation to medical appointments. 339-542-4659 ? Social Security Administration: May apply for disability if have a Stage IV cancer. 620 504 1036 8541871822 ? [employer], Disability and Transit Services: Assists with nutrition, care and transit needs. [place] Support Programs: @10RELATIVEDAYS @ > Cancer Support Group  2nd Tuesday of the month 1pm-2pm, Journey Room  > Creative Journey  3rd Tuesday of the month 1130am-1pm, Journey Room  > Look Good Feel Better  1st Wednesday of the month 10am-12 noon, Journey Room (Call [place] to register [phone])

## 2016-04-27 NOTE — Progress Notes (Signed)
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 male returns for follow-up of CLL.   He was recently diagnosed with diabetes, HTN, and acid reflux. Acid reflux has improved.  Dantrell reports normal appetite. Denies difficulty sleeping, bowel issues, or difficulty breathing. No B symptoms  PCP is Dr. Maudie Mercury.  He has already received flu shot this year.   MEDICAL HISTORY: Past Medical History:  Diagnosis Date  . Acute MI, inferolateral wall, initial episode of care ([place]) 06/16/15   99% dCx   . BPH (benign prostatic hyperplasia) 04/08/2011  . CAD S/P percutaneous coronary angioplasty 11/23/12; 06/16/15   a. 2 overlapping mRCA lesions - Xience Xpedition DES 2.75 mm x 18 mm (3.0 mm);; b. Inf-Lat STEMI 06/16/2015 - dCx Asp Thrombectomy & PCI 3.0 x 15 Xience drDES (3.6 mm)  . Chronic headache   . CLL (chronic lymphocytic leukemia) ([place]) 04/08/2011  . Depression   . Diverticulitis   . Gallstone   . GERD (gastroesophageal reflux disease)   . Hypercholesteremia   . Ischemic cardiomyopathy    Echo 06/18/2015 EF 35-40% after lateral MI -> repeat echo 08/06/2015: EF 40-45% with basal-mid inferolateral akinesis and hypokinesis of the lateral wall.  . NSTEMI (non-ST elevated myocardial infarction) ([place]) 11/23/12   NSTEMI 11/23/2012 RCA lesion - PCI with DES; moderate LAD disease  . PTSD (post-traumatic stress disorder)    With Depression - since MI    has CLL (chronic lymphocytic leukemia) ([place]); BPH (benign prostatic hyperplasia); Diverticulitis of colon without hemorrhage- on antibiotics for recent flair up; Hx of adenomatous colonic polyps; NSTEMI (non-ST elevated myocardial infarction) - history of; Hyperglycemia; History of smoking; Dyslipidemia, goal LDL below 70 - on Vytorin; well  controlled; CAD S/P PCI-- RCA DES 2014,  CFX DES Jan 2017; PTSD (post-traumatic stress disorder); Lower leg edema; Hematochezia; Cholelithiasis without cholecystitis; STEMI - 06/16/15; Benign essential HTN; Acute inferolateral myocardial infarction [facility]); Ischemic cardiomyopathy; GERD (gastroesophageal reflux disease); Esophageal dysphagia; and Chronic cough on his problem list.     has No Known Allergies.  Mr. Lamberti had no medications administered during this visit.  SURGICAL HISTORY: Past Surgical History:  Procedure Laterality Date  . APPENDECTOMY  1968-70  . CARDIAC CATHETERIZATION N/A 06/16/2015   Procedure: Left Heart Cath and Coronary Angiography;  Surgeon: Jettie Booze, MD;  Location: [facility];  Service: Cardiovascular: 99% thrombotic dCx -> asp thrombectomy & PCI. RCA Stents Patent.   Marland Kitchen CARDIAC CATHETERIZATION N/A 06/16/2015   Procedure: Coronary Stent Intervention;  Surgeon: Jettie Booze, MD;  Location: [facility];  Service: Cardiovascular: PCI dCx = thrombectomy -> 3.0 x 15 Xience drug-eluting stent (3.6 mm)   . COLONOSCOPY  2008   [id number] diverticula, diminutive polyp in the cecum, s/p bx Normal rectum. Tubular adenoma  . COLONOSCOPY N/A 10/17/2012   Dr. Gala Romney: colonic diverticulosis, tubular adenoma, surveillance due May 2019  . CORONARY STENT PLACEMENT  11/23/12   Mid RCA -- Xience eXp - 2.75 mm x 18 mm DES (3.30mm) , Dr. Ellyn Hack  . LEFT HEART CATHETERIZATION WITH CORONARY ANGIOGRAM N/A 11/23/2012   Procedure: LEFT HEART CATHETERIZATION WITH CORONARY ANGIOGRAM;  Surgeon: Leonie Man, MD;  Location: [facility] CATH LAB;  Service:  Cardiovascular: NSTEMI: Culprit = mid RCA 95% & 75%; LAD ~40-50%, Small RI ~60%; EF ~60%, mild inf-basal HK     . TRANSTHORACIC ECHOCARDIOGRAM  06/18/2015   a. EF 35-40%. Entire inferior hypokinesis and akinesis of the basal and mid inferolateral and anterolateral /distal lateral walls;; b. EF 40 and 45%. Akinesis of basal-mid  inferolateral wall. Hypokinesis of entire lateral wall.    SOCIAL HISTORY: Social History   Social History  . Marital status: Married    Spouse name: N/A  . Number of children: N/A  . Years of education: N/A   Occupational History  . Not on file.   Social History Main Topics  . Smoking status: Former Smoker    Packs/day: 0.25    Years: 2.00    Types: Cigarettes    Quit date: 06/07/1963  . Smokeless tobacco: Never Used  . Alcohol use No     Comment: Occasionally drinks red wine  . Drug use: No  . Sexual activity: Not on file   Other Topics Concern  . Not on file   Social History Narrative   Currently in Cardiac Rehabilitation   "former smoker". Does not drink EtOH   Married.    FAMILY HISTORY: Family History  Problem Relation Age of Onset  . Diabetes Mother   . Cancer Father   . Colon cancer Neg Hx     Review of Systems  Constitutional: Negative for fever, chills, weight loss and malaise/fatigue.  HENT: Negative for congestion, hearing loss, nosebleeds, sore throat and tinnitus.   Eyes: Negative for blurred vision, double vision, pain and discharge.  Respiratory: Negative for cough, hemoptysis, sputum production, shortness of breath and wheezing.   Cardiovascular: Negative for chest pain, palpitations, claudication, leg swelling and PND.  Gastrointestinal: Positive for heartburn. Negative for nausea, vomiting, abdominal pain, diarrhea, constipation, blood in stool and melena.  Heartburn improved Genitourinary: Negative for dysuria, urgency, frequency and hematuria.  Musculoskeletal: Negative for myalgias, joint pain and falls.  Skin: Negative for itching and rash.  Neurological: Negative for dizziness, tingling, tremors, sensory change, speech change, focal weakness, seizures, loss of consciousness, weakness and headaches.  Endo/Heme/Allergies: Does not bruise/bleed easily.  Psychiatric/Behavioral: Negative for depression, suicidal ideas, memory loss and  substance abuse. The patient is not nervous/anxious and does not have insomnia.    PHYSICAL EXAMINATION  ECOG PERFORMANCE STATUS: 0 - Asymptomatic  Vitals:   04/27/16 1039  BP: (!) 139/58  Pulse: (!) 51  Resp: 16  Temp: 97.9 F (36.6 C)    Physical Exam  Constitutional: He is oriented to person, place, and time and well-developed, well-nourished, and in no distress.  HENT:  Head: Normocephalic and atraumatic.  Nose: Nose normal.  Mouth/Throat: Oropharynx is clear and moist. No oropharyngeal exudate.  Eyes: Conjunctivae and EOM are normal. Pupils are equal, round, and reactive to light. Right eye exhibits no discharge. Left eye exhibits no discharge. No scleral icterus.  Neck: Normal range of motion. Neck supple. No tracheal deviation present. No thyromegaly present.  Cardiovascular: Normal rateand normal heart sounds.  Exam reveals no gallop and no friction rub.   No murmur heard. Pulmonary/Chest: Effort normal and breath sounds normal. He has no wheezes. He has no rales.  Abdominal: Soft. Bowel sounds are normal. He exhibits no distension and no mass. There is no tenderness. There is no rebound and no guarding.  Musculoskeletal: Normal range of motion.  Chronic lower extremity edema. Lymphadenopathy:       Head (left side): Submandibular adenopathy  present.    He has no cervical adenopathy.  Small palpable node Left submandibular region about 1 cm  Neurological: He is alert and oriented to person, place, and time. He has normal reflexes. No cranial nerve deficit. Gait normal. Coordination normal.  Skin: Skin is warm and dry. No rash noted.  Psychiatric: Mood, memory, affect and judgment normal.  Nursing note and vitals reviewed.   LABORATORY DATA:  CBC    Component Value Date/Time   WBC 50.8 (HH) 04/27/2016 1020   RBC 4.78 04/27/2016 1020   HGB 14.4 04/27/2016 1020   HCT 46.6 04/27/2016 1020   PLT 160 04/27/2016 1020   MCV 97.5 04/27/2016 1020   MCH 30.1 04/27/2016  1020   MCHC 30.9 04/27/2016 1020   RDW 14.4 04/27/2016 1020   LYMPHSABS 47.3 (H) 04/27/2016 1020   MONOABS 0.5 04/27/2016 1020   EOSABS 0.5 04/27/2016 1020   BASOSABS 0.0 04/27/2016 1020   CMP     Component Value Date/Time   NA 141 04/27/2016 1020   NA 141 01/13/2016 1027   K 4.2 04/27/2016 1020   CL 108 04/27/2016 1020   CO2 28 04/27/2016 1020   GLUCOSE 111 (H) 04/27/2016 1020   BUN 14 04/27/2016 1020   BUN 16 01/13/2016 1027   CREATININE 1.35 (H) 04/27/2016 1020   CREATININE 1.22 10/08/2013 1152   CALCIUM 9.2 04/27/2016 1020   PROT 6.7 04/27/2016 1020   PROT 6.4 01/13/2016 1027   ALBUMIN 4.0 04/27/2016 1020   ALBUMIN 3.9 01/13/2016 1027   AST 25 04/27/2016 1020   ALT 12 (L) 04/27/2016 1020   ALKPHOS 45 04/27/2016 1020   BILITOT 0.9 04/27/2016 1020   BILITOT 0.6 01/13/2016 1027   GFRNONAA 51 (L) 04/27/2016 1020   GFRAA 59 (L) 04/27/2016 1020   Results for IROH, JESCHKE (MRN [id number])   Ref. Range 03/21/2014 10:32 08/14/2014 10:44 12/16/2014 10:46 06/16/2015 14:25 06/17/2015 02:43 06/18/2015 03:47 06/22/2015 10:25 04/27/2016 10:20  WBC Latest Ref Range: 4.0 - 10.5 K/uL 47.2 (H) 56.4 (HH) 57.6 (HH) 49.0 (H) 47.8 (H) 48.0 (H) 46.3 (H) 50.8 (HH)     ASSESSMENT and THERAPY PLAN:  CLL, stable counts, slow doubling time Diabetes  From the perspective of his CLL he is stable. We will continue with ongoing observation at 6 month intervals. I again reviewed signs and symptoms of concern and the patient states he is very comfortable with these symptoms and knows to call.  We will schedule with a follow up in 6 months.  Labs were reviewed with the patient in detail. Results are noted above. WBC count was reviewed over the past several years as well per his request.   All questions were answered. The patient knows to call the clinic with any problems, questions or concerns. We can certainly see the patient much sooner if necessary.  This document serves as a record of services  personally performed by Ancil Linsey, MD. It was created on her behalf by Elmyra Ricks, a trained medical scribe. The creation of this record is based on the scribe's personal observations and the provider's statements to them. This document has been checked and approved by the attending provider.  I have reviewed the above documentation for accuracy and completeness, and I agree with the above.  This note was electronically signed. Molli Hazard, MD

## 2016-04-27 NOTE — Progress Notes (Unsigned)
CRITICAL VALUE ALERT  Critical value received:  WBC 50.8  Date of notification:  04/27/16  Time of notification:  [id number]  Critical value read back:Yes.    Nurse who received alert:  A. Ouida Sills, RN  MD notified:  Dr. Whitney Muse at [phone] 04/27/16

## 2016-05-11 ENCOUNTER — Encounter: Payer: Self-pay | Admitting: Cardiology

## 2016-05-11 ENCOUNTER — Ambulatory Visit (INDEPENDENT_AMBULATORY_CARE_PROVIDER_SITE_OTHER): Payer: Medicare HMO | Admitting: Cardiology

## 2016-05-11 ENCOUNTER — Other Ambulatory Visit: Payer: Self-pay | Admitting: Gastroenterology

## 2016-05-11 VITALS — BP 102/60 | HR 53 | Ht 69.0 in | Wt 172.0 lb

## 2016-05-11 DIAGNOSIS — I2119 ST elevation (STEMI) myocardial infarction involving other coronary artery of inferior wall: Secondary | ICD-10-CM | POA: Diagnosis not present

## 2016-05-11 DIAGNOSIS — I10 Essential (primary) hypertension: Secondary | ICD-10-CM

## 2016-05-11 DIAGNOSIS — R600 Localized edema: Secondary | ICD-10-CM | POA: Diagnosis not present

## 2016-05-11 DIAGNOSIS — I2510 Atherosclerotic heart disease of native coronary artery without angina pectoris: Secondary | ICD-10-CM | POA: Diagnosis not present

## 2016-05-11 DIAGNOSIS — E785 Hyperlipidemia, unspecified: Secondary | ICD-10-CM | POA: Diagnosis not present

## 2016-05-11 DIAGNOSIS — I255 Ischemic cardiomyopathy: Secondary | ICD-10-CM

## 2016-05-11 DIAGNOSIS — I214 Non-ST elevation (NSTEMI) myocardial infarction: Secondary | ICD-10-CM

## 2016-05-11 DIAGNOSIS — Z9861 Coronary angioplasty status: Secondary | ICD-10-CM

## 2016-05-11 NOTE — Patient Instructions (Addendum)
MEDICATION CHANGES: START  TAKING LISINOPRIL  IN THE MORNING AFTER BREAKFAST TIME.  TAKING METOPROLOL  IN TH EVENING AFTER DINNER TIME.  NO OTHER CHANGES  Your physician wants you to follow-up in [place]. You will receive a reminder letter in the mail two months in advance. If you don't receive a letter, please call our office to schedule the follow-up appointment.  If you need a refill on your cardiac medications before your next appointment, please call your pharmacy.\

## 2016-05-11 NOTE — Progress Notes (Signed)
PCP: [name] Gravel, MD  Clinic Note: Chief Complaint  Patient presents with  . Follow-up  . Coronary Artery Disease    HPI: [name] is a 70 y.o. male with a PMH below who presents today for Six-month follow-up of CAD-non-STEMI status post PCI to RCA in June 2014. Normal EF by echocardiogram. Subsequently had inferolateral STEMI with 95% stenosis in the distal circumflex in January 2017 treated with DES stent. EF of 45%. - Had issues with Brilinta, and Crestor due to financial issues.  [name] was last seen in June 2016. No active heart symptoms. Noted to be on Vytorin. Breathing issues with Brilinta, switch to Plavix.  Recent Hospitalizations: None  Studies Reviewed: None since echo in March 2017 showing EF 40 and 45%.  Interval History: [name] presents today doing fairly well. Apparently he needs to have an EGD done time in January, and needs to be evaluated for that. From a cardiac standpoint he seems to be doing pretty well. He still has some edema but no PND or orthopnea. Usually the edema goes away when he puts his feet up at night and has not been the morning.  He has occasional orthostatic dizziness, but no syncope or near-syncope. No rapid irregular heartbeats or palpitations.  Please try to work on his fitness level but does note that he gets tired quicker than he usually would. But this is not a new acute change. He does state he is doing better than he was prior to his cath back in April. He still does do his yard work, but just can't do as much.  He really does not note any chest pain or shortness of breath with rest or exertion.  No TIA/amaurosis fugax symptoms. No melena, hematochezia, hematuria, or epstaxis. No claudication.  ROS: A comprehensive was performed. Review of Systems  HENT: Negative for congestion and sinus pain.   Respiratory: Negative for shortness of breath and wheezing.   Gastrointestinal: Negative for blood in stool and melena.    Genitourinary: Negative for hematuria.  Musculoskeletal: Positive for joint pain.  Skin: Negative.   Neurological: Positive for dizziness (Orthostatic ).  Endo/Heme/Allergies: Positive for environmental allergies. Bruises/bleeds easily.  Psychiatric/Behavioral: Positive for memory loss (Stable). Negative for depression. The patient is not nervous/anxious and does not have insomnia.   All other systems reviewed and are negative.   Past Medical History:  Diagnosis Date  . Acute MI, inferolateral wall, initial episode of care ([place]) 06/16/15   99% dCx   . BPH (benign prostatic hyperplasia) 04/08/2011  . CAD S/P percutaneous coronary angioplasty 11/23/12; 06/16/15   a. 2 overlapping mRCA lesions - Xience Xpedition DES 2.75 mm x 18 mm (3.0 mm);; b. Inf-Lat STEMI 06/16/2015 - dCx Asp Thrombectomy & PCI 3.0 x 15 Xience drDES (3.6 mm)  . Chronic headache   . CLL (chronic lymphocytic leukemia) ([place]) 04/08/2011  . Depression   . Diverticulitis   . Gallstone   . GERD (gastroesophageal reflux disease)   . Hypercholesteremia   . Ischemic cardiomyopathy    Echo 06/18/2015 EF 35-40% after lateral MI -> repeat echo 08/06/2015: EF 40-45% with basal-mid inferolateral akinesis and hypokinesis of the lateral wall.  . NSTEMI (non-ST elevated myocardial infarction) ([place]) 11/23/12   NSTEMI 11/23/2012 RCA lesion - PCI with DES; moderate LAD disease  . PTSD (post-traumatic stress disorder)    With Depression - since MI    Past Surgical History:  Procedure Laterality Date  . APPENDECTOMY  1968-70  .  CARDIAC CATHETERIZATION N/A 06/16/2015   Procedure: Left Heart Cath and Coronary Angiography;  Surgeon: Jettie Booze, MD;  Location: [facility];  Service: Cardiovascular: 99% thrombotic dCx -> asp thrombectomy & PCI. RCA Stents Patent.   Marland Kitchen CARDIAC CATHETERIZATION N/A 06/16/2015   Procedure: Coronary Stent Intervention;  Surgeon: Jettie Booze, MD;  Location: [facility];  Service:  Cardiovascular: PCI dCx = thrombectomy -> 3.0 x 15 Xience drug-eluting stent (3.6 mm)   . COLONOSCOPY  2008   [id number] diverticula, diminutive polyp in the cecum, s/p bx Normal rectum. Tubular adenoma  . COLONOSCOPY N/A 10/17/2012   Dr. Gala Romney: colonic diverticulosis, tubular adenoma, surveillance due May 2019  . CORONARY STENT PLACEMENT  11/23/12   Mid RCA -- Xience eXp - 2.75 mm x 18 mm DES (3.55mm) , Dr. Ellyn Hack  . LEFT HEART CATHETERIZATION WITH CORONARY ANGIOGRAM N/A 11/23/2012   Procedure: LEFT HEART CATHETERIZATION WITH CORONARY ANGIOGRAM;  Surgeon: Leonie Man, MD;  [facility];  Service: Cardiovascular: NSTEMI: Culprit = mid RCA 95% & 75%; LAD ~40-50%, Small RI ~60%; EF ~60%, mild inf-basal HK     . TRANSTHORACIC ECHOCARDIOGRAM  January 2017; March 2017   a. EF 35-40%. Entire inferior hypokinesis and akinesis of the basal and mid inferolateral and anterolateral /distal lateral walls;; b. EF 40 and 45%. Akinesis of basal-mid inferolateral wall. Hypokinesis of entire lateral wall.    Cath - RCA PCI to Cx Jan 2017: 3.0 x 15 Xience drug-eluting stent (3.6 mm)           Current Meds  Medication Sig  . aspirin EC 81 MG tablet Take 81 mg by mouth as needed.   . Cholecalciferol (D3-1000) 1000 UNITS capsule Take 1,000 Units by mouth 2 (two) times a week.    . clopidogrel (PLAVIX) 75 MG tablet Take 1 tablet (75 mg total) by mouth daily.  . Cyanocobalamin (VITAMIN B-12) 2500 MCG SUBL Place 1 tablet under the tongue once a week. Reported on 06/25/2015  . ferrous sulfate 325 (65 FE) MG tablet Take 325 mg by mouth 3 (three) times a week.   Marland Kitchen FOLIC ACID PO Take 1 tablet by mouth 3 (three) times a week.  . hydrochlorothiazide (HYDRODIURIL) 12.5 MG tablet Take 12.5 mg by mouth as needed.  Marland Kitchen L-Arginine 500 MG CAPS Take 1 capsule by mouth every 30 (thirty) days. Reported on 06/25/2015  . lisinopril (PRINIVIL,ZESTRIL) 2.5 MG tablet Take 1 tablet (2.5 mg total) by mouth daily.  .  metFORMIN (GLUCOPHAGE) 500 MG tablet Take 250 mg by mouth 2 (two) times daily with a meal.  . metoprolol succinate (TOPROL XL) 25 MG 24 hr tablet Take 1 tablet (25 mg total) by mouth daily.  . Multiple Vitamins-Minerals (ZINC PO) Take 1 tablet by mouth every 30 (thirty) days.  . nitroGLYCERIN (NITROSTAT) 0.4 MG SL tablet Place 1 tablet (0.4 mg total) under the tongue every 5 (five) minutes as needed for chest pain.  Marland Kitchen OVER THE COUNTER MEDICATION Take 1 tablet by mouth 3 (three) times a week. Reported on 06/25/2015  . pyridoxine (B-6) 100 MG tablet Take 100 mg by mouth 3 (three) times a week. Reported on 06/25/2015  . simvastatin (ZOCOR) 40 MG tablet Take 1 tablet (40 mg total) by mouth every other day.  Marland Kitchen VITAMIN A PO Take 1 capsule by mouth 3 (three) times a week.  . vitamin C (ASCORBIC ACID) 500 MG tablet Take 500 mg by mouth 3 (three) times  a week.   . [DISCONTINUED] pantoprazole (PROTONIX) 40 MG tablet Take 1 tablet (40 mg total) by mouth 2 (two) times daily.   No Known Allergies   Social History   Social History  . Marital status: Married    Spouse name: N/A  . Number of children: N/A  . Years of education: N/A   Social History Main Topics  . Smoking status: Former Smoker    Packs/day: 0.25    Years: 2.00    Types: Cigarettes    Quit date: 06/07/1963  . Smokeless tobacco: Never Used  . Alcohol use No     Comment: Occasionally drinks red wine  . Drug use: No  . Sexual activity: Not Asked   Other Topics Concern  . None   Social History Narrative   Currently in Cardiac Rehabilitation   "former smoker". Does not drink EtOH   Married.   Family History  Problem Relation Age of Onset  . Diabetes Mother   . Cancer Father   . Colon cancer Neg Hx      Wt Readings from Last 3 Encounters:  05/11/16 78 kg (172 lb)  04/27/16 78.3 kg (172 lb 9.6 oz)  04/12/16 78 kg (172 lb)    PHYSICAL EXAM BP 102/60 (BP Location: Right Arm, Patient Position: Sitting, Cuff Size: Normal)    Pulse (!) 53   Ht 5\' 9"  (1.753 m)   Wt 78 kg (172 lb)   SpO2 98%   BMI 25.40 kg/m  General appearance: alert, cooperative, appears stated age, no distress and relatively healthy-appearing. Well-nourished and well-groomed. Pleasant mood and affect.  HEENT: [place]/AT, EOMI, MMM, anicteric sclera  Neck: no adenopathy, no carotid bruit, no JVD, supple, symmetrical, trachea midline and thyroid not enlarged, symmetric, no tenderness/mass/nodules  Lungs: CTA B., normal percussion bilaterally and nonlabored, good air movement.  Heart: RRR, S1, S2 normal, no murmur, click, rub or gallop and normal apical impulse -- palpation lung inferior costal margin reveals significant costochondral tenderness that is reproducible  Abdomen: soft, non-tender; bowel sounds normal; no masses, no organomegaly  Extremities: No C./C./ ~2+ BLE pitting edema; mild stasis dermatitis & varicose veins. no ulcers or lesions;Pulses: 2+ and symmetric  Neurologic: Alert and oriented X 3, normal strength and tone. Normal symmetric reflexes. Normal coordination and gait     Adult ECG Report  Rate: 53 ;  Rhythm: sinus bradycardia and Inferior Q-wave/inferior MI, age undetermined. Low voltage. Otherwise normal axis, intervals and durations.;   Narrative Interpretation: Stable EKG -inferolateral T waves are now back up right.   Other studies Reviewed: Additional studies/ records that were reviewed today include:  Recent Labs:  PCP follows Lab Results  Component Value Date   CHOL 140 01/13/2016   HDL 30 (L) 01/13/2016   LDLCALC 69 01/13/2016   TRIG 203 (H) 01/13/2016   CHOLHDL 5.0 06/16/2015    ASSESSMENT / PLAN: Problem List Items Addressed This Visit    CAD S/P PCI-- RCA DES 2014,  CFX DES Jan 2017 - Primary (Chronic)    Doing well without any active anginal symptoms after his last PCI. Stents in both the RCA and circumflex.  He is on aspirin and Plavix. I think we can stop aspirin and only used when necessary for  headache. This will help is bruising.  He is on stable low dose of beta blocker and ACE inhibitor. I simply just asked that hethese doses out taking 1 in the morning and the other in the evening  I recommend  the need to take a statin routinely every other day.      Relevant Orders   EKG 12-Lead (Completed)   NSTEMI (non-ST elevated myocardial infarction) - history of (Chronic)   Dyslipidemia, goal LDL below 70 - on simvastatin; monitored by PCP (Chronic)    Labs just checked in August The plan from June was for the changes statin to every day, but he now has listed simvastatin every other day. Thankfully, his LDL seems to be controlled. HDL is low, but likely that will be helped by the statin. Triglycerides are elevated. We talked about dietary adjustment.      Relevant Orders   EKG 12-Lead (Completed)   Lower leg edema (Chronic)    Probably related to venous stasis recommended compression stockings, he may or may not be wearing them. Relatively controlled though on HCTZ. Would hold off on furosemide for now.      Relevant Orders   EKG 12-Lead (Completed)   Benign essential HTN (Chronic)    Well-controlled, if anything a little bit low. In order to avoid doubling up his meds will have him take metoprolol in the evening and lisinopril+HCTZ in the morning. (If this works, we can actually potentially convert the lisinopril and HCTZ to a combination medication)      Relevant Orders   EKG 12-Lead (Completed)   Ischemic cardiomyopathy (Chronic)    EF now back to only mildly reduced pump function with expected infarct related wall motion abnormality. On stable dose of beta blocker and ACE inhibitor.  No heart failure symptoms. I don't think his edema is related to heart failure as he has no PND and orthopnea. With the low-dose HCTZ, the edema has been controlled. Just recommended that he elevate his feet when he can.      Relevant Orders   EKG 12-Lead (Completed)   Acute inferolateral  myocardial infarction [facility])      Current medicines are reviewed at length with the patient today. (+/- concerns) none The following changes have been made: See below.  Patient Instructions  MEDICATION CHANGES: START  TAKING LISINOPRIL  IN THE MORNING AFTER BREAKFAST TIME.  TAKING METOPROLOL  IN TH EVENING AFTER DINNER TIME.  NO OTHER CHANGES  Your physician wants you to follow-up in [place]. You will receive a reminder letter in the mail two months in advance. If you don't receive a letter, please call our office to schedule the follow-up appointment.  If you need a refill on your cardiac medications before your next appointment, please call your pharmacy.\    Studies Ordered:   Orders Placed This Encounter  Procedures  . EKG 12-Lead      Glenetta Hew, M.D., M.S. Interventional Cardiologist   Pager # [phone] Phone # [phone] [address] [place], [place] [address]

## 2016-05-17 ENCOUNTER — Encounter: Payer: Self-pay | Admitting: Internal Medicine

## 2016-05-18 ENCOUNTER — Encounter: Payer: Self-pay | Admitting: Cardiology

## 2016-05-18 NOTE — Assessment & Plan Note (Signed)
Labs just checked in August The plan from June was for the changes statin to every day, but he now has listed simvastatin every other day. Thankfully, his LDL seems to be controlled. HDL is low, but likely that will be helped by the statin. Triglycerides are elevated. We talked about dietary adjustment.

## 2016-05-18 NOTE — Assessment & Plan Note (Signed)
Well-controlled, if anything a little bit low. In order to avoid doubling up his meds will have him take metoprolol in the evening and lisinopril+HCTZ in the morning. (If this works, we can actually potentially convert the lisinopril and HCTZ to a combination medication)

## 2016-05-18 NOTE — Assessment & Plan Note (Signed)
EF now back to only mildly reduced pump function with expected infarct related wall motion abnormality. On stable dose of beta blocker and ACE inhibitor.  No heart failure symptoms. I don't think his edema is related to heart failure as he has no PND and orthopnea. With the low-dose HCTZ, the edema has been controlled. Just recommended that he elevate his feet when he can.

## 2016-05-18 NOTE — Assessment & Plan Note (Signed)
Doing well without any active anginal symptoms after his last PCI. Stents in both the RCA and circumflex.  He is on aspirin and Plavix. I think we can stop aspirin and only used when necessary for headache. This will help is bruising.  He is on stable low dose of beta blocker and ACE inhibitor. I simply just asked that hethese doses out taking 1 in the morning and the other in the evening  I recommend the need to take a statin routinely every other day.

## 2016-05-18 NOTE — Assessment & Plan Note (Signed)
Probably related to venous stasis recommended compression stockings, he may or may not be wearing them. Relatively controlled though on HCTZ. Would hold off on furosemide for now.

## 2016-05-25 ENCOUNTER — Ambulatory Visit: Payer: Medicare HMO | Admitting: Cardiology

## 2016-06-25 ENCOUNTER — Other Ambulatory Visit: Payer: Self-pay | Admitting: Physician Assistant

## 2016-06-27 NOTE — Telephone Encounter (Signed)
Please review for refill. Thanks!  

## 2016-06-27 NOTE — Telephone Encounter (Signed)
Rx(s) sent to pharmacy electronically.  

## 2016-07-02 ENCOUNTER — Encounter (HOSPITAL_COMMUNITY): Payer: Self-pay | Admitting: Hematology & Oncology

## 2016-08-12 ENCOUNTER — Encounter: Payer: Self-pay | Admitting: Family Medicine

## 2016-09-19 ENCOUNTER — Other Ambulatory Visit: Payer: Self-pay | Admitting: Cardiology

## 2016-10-03 ENCOUNTER — Telehealth: Payer: Self-pay | Admitting: Cardiology

## 2016-10-03 NOTE — Telephone Encounter (Signed)
Left message to call back, per DPR. 

## 2016-10-03 NOTE — Telephone Encounter (Signed)
New message   *STAT* If patient is at the pharmacy, call can be transferred to refill team.   1. Which medications need to be refilled? (please list name of each medication and dose if known) metoprolol succinate (TOPROL-XL) 25 MG 24 hr tablet  2. Which pharmacy/location (including street and city if local pharmacy) is [facility] to [facility] sent [facility] [place]  3. Do they need a 30 day or 90 day supply? 90 day supply

## 2016-10-04 ENCOUNTER — Other Ambulatory Visit: Payer: Self-pay | Admitting: *Deleted

## 2016-10-04 ENCOUNTER — Telehealth: Payer: Self-pay | Admitting: *Deleted

## 2016-10-04 MED ORDER — METOPROLOL SUCCINATE ER 25 MG PO TB24: ORAL_TABLET | ORAL | 0 refills | Status: DC

## 2016-10-04 NOTE — Telephone Encounter (Signed)
Spoke with the patient. He requested a refill of Metoprolol 25 mg which [facility]. He has a follow up appointment on 5/21.

## 2016-10-19 ENCOUNTER — Ambulatory Visit (INDEPENDENT_AMBULATORY_CARE_PROVIDER_SITE_OTHER): Payer: Medicare HMO | Admitting: Urology

## 2016-10-19 DIAGNOSIS — R972 Elevated prostate specific antigen [PSA]: Secondary | ICD-10-CM | POA: Diagnosis not present

## 2016-10-19 DIAGNOSIS — R351 Nocturia: Secondary | ICD-10-CM | POA: Diagnosis not present

## 2016-10-19 DIAGNOSIS — N401 Enlarged prostate with lower urinary tract symptoms: Secondary | ICD-10-CM | POA: Diagnosis not present

## 2016-10-24 ENCOUNTER — Ambulatory Visit (INDEPENDENT_AMBULATORY_CARE_PROVIDER_SITE_OTHER): Payer: Medicare HMO | Admitting: Cardiology

## 2016-10-24 ENCOUNTER — Encounter: Payer: Self-pay | Admitting: Cardiology

## 2016-10-24 VITALS — BP 122/58 | HR 48 | Ht 69.0 in | Wt 163.8 lb

## 2016-10-24 DIAGNOSIS — R600 Localized edema: Secondary | ICD-10-CM

## 2016-10-24 DIAGNOSIS — E785 Hyperlipidemia, unspecified: Secondary | ICD-10-CM

## 2016-10-24 DIAGNOSIS — I214 Non-ST elevation (NSTEMI) myocardial infarction: Secondary | ICD-10-CM | POA: Diagnosis not present

## 2016-10-24 DIAGNOSIS — Z9861 Coronary angioplasty status: Secondary | ICD-10-CM

## 2016-10-24 DIAGNOSIS — I10 Essential (primary) hypertension: Secondary | ICD-10-CM

## 2016-10-24 DIAGNOSIS — I2510 Atherosclerotic heart disease of native coronary artery without angina pectoris: Secondary | ICD-10-CM

## 2016-10-24 DIAGNOSIS — I2119 ST elevation (STEMI) myocardial infarction involving other coronary artery of inferior wall: Secondary | ICD-10-CM | POA: Diagnosis not present

## 2016-10-24 DIAGNOSIS — I255 Ischemic cardiomyopathy: Secondary | ICD-10-CM | POA: Diagnosis not present

## 2016-10-24 MED ORDER — METOPROLOL SUCCINATE ER 25 MG PO TB24: 12.5000 mg | ORAL_TABLET | Freq: Every day | ORAL | 3 refills | Status: DC

## 2016-10-24 NOTE — Patient Instructions (Signed)
MEDICATION CHANGE DECREASE METOPROLOL SUCCINATE TO 1/2 TABLET DAILY (12.5 MG)     LABS- DO NOT EAT OR DRINK THE MORNING OF THE TEST LIPIDS  HEPATIC PANEL    Your physician wants you to follow-up in: [place] DR Ellyn Hack You will receive a reminder letter in the mail two months in advance. If you don't receive a letter, please call our office to schedule the follow-up appointment.    If you need a refill on your cardiac medications before your next appointment, please call your pharmacy.

## 2016-10-24 NOTE — Progress Notes (Signed)
PCP: [name], MD  Clinic Note: Chief Complaint  Patient presents with  . Follow-up    STEMI: CAD - PCI    HPI: [name] is a 71 y.o. male with a PMH below who presents today for Six-month follow-up of CAD-PCI. He is now just over 1 year out from his recent inferolateral STEMI in January 2017.[name] was last seen on 05/11/2016 -> was doing fairly well. Is working on his fitness level and doing increasing levels of exercise and yardwork. Feeling better than prior to his cath.  No signs of heart failure  Recent Hospitalizations: None Studies Personally Reviewed - (if available, images/films reviewed: From Epic Chart or Care Everywhere) - no new studies  2-D echo March 2017: EF 40-45% with akinesis of the basal-mid inferolateral wall. Lateral hypokinesis. GI 1 DD. Mild MR.  Interval History: [name] returns today for delayed hospital follow-up actually with no major complaints. He is starting to back to full activity levels -> walks 3-4 days a week couple miles and also goes to [employer] [facility] center 3 times a week. His activity, he denies any resting or exertional chest tightness/pressure or dyspnea.  He notes is often on side pain along the lateral rib cage on the left. Otherwise no untoward symptoms. No PND, orthopnea with stable mild edema that improves with elevating his feet. No fatigue or exercise intolerance.  No palpitations, lightheadedness, dizziness, weakness or syncope/near syncope. No TIA/amaurosis fugax symptoms. No melena, hematochezia, hematuria, or epstaxis - he does note some easy bruising on the [place]. No claudication.  ROS: A comprehensive was performed. Review of Systems  Constitutional: Positive for weight loss (Has increased his exercise level and adjust his diet.). Negative for malaise/fatigue.  HENT: Negative for congestion and nosebleeds.   Respiratory: Negative for cough, shortness of breath and wheezing.   Gastrointestinal:  Negative.   Genitourinary: Negative.   Musculoskeletal: Negative for falls and myalgias.  Neurological: Negative for dizziness (Just occasional orthostatic dizziness) and focal weakness.  Endo/Heme/Allergies: Negative for environmental allergies. Bruises/bleeds easily.  Psychiatric/Behavioral: Positive for memory loss (Stable.). The patient is not nervous/anxious and does not have insomnia.   All other systems reviewed and are negative.  I have reviewed and (if needed) personally updated the patient's problem list, medications, allergies, past medical and surgical history, social and family history.   Past Medical History:  Diagnosis Date  . Acute MI, inferolateral wall, initial episode of care ([place]) 06/16/15   99% dCx   . BPH (benign prostatic hyperplasia) 04/08/2011  . CAD S/P percutaneous coronary angioplasty 11/23/12; 06/16/15   a. 2 overlapping mRCA lesions - Xience Xpedition DES 2.75 mm x 18 mm (3.0 mm);; b. Inf-Lat STEMI 06/16/2015 - dCx Asp Thrombectomy & PCI 3.0 x 15 Xience drDES (3.6 mm)  . Chronic headache   . CLL (chronic lymphocytic leukemia) ([place]) 04/08/2011  . Depression   . Diverticulitis   . Gallstone   . GERD (gastroesophageal reflux disease)   . Hypercholesteremia   . Ischemic cardiomyopathy    Echo 06/18/2015 EF 35-40% after lateral MI -> repeat echo 08/06/2015: EF 40-45% with basal-mid inferolateral akinesis and hypokinesis of the lateral wall.  . NSTEMI (non-ST elevated myocardial infarction) ([place]) 11/23/12   NSTEMI 11/23/2012 RCA lesion - PCI with DES; moderate LAD disease  . PTSD (post-traumatic stress disorder)    With Depression - since MI    Past Surgical History:  Procedure Laterality Date  . APPENDECTOMY  1968-70  .  CARDIAC CATHETERIZATION N/A 06/16/2015   Procedure: Left Heart Cath and Coronary Angiography;  Surgeon: Jettie Booze, MD;  Location: [facility];  Service: Cardiovascular: 99% thrombotic dCx -> asp thrombectomy & PCI. RCA Stents Patent.    Marland Kitchen CARDIAC CATHETERIZATION N/A 06/16/2015   Procedure: Coronary Stent Intervention;  Surgeon: Jettie Booze, MD;  Location: [facility];  Service: Cardiovascular: PCI dCx = thrombectomy -> 3.0 x 15 Xience drug-eluting stent (3.6 mm)   . COLONOSCOPY  2008   [id number] diverticula, diminutive polyp in the cecum, s/p bx Normal rectum. Tubular adenoma  . COLONOSCOPY N/A 10/17/2012   Dr. Gala Romney: colonic diverticulosis, tubular adenoma, surveillance due May 2019  . CORONARY STENT PLACEMENT  11/23/12   Mid RCA -- Xience eXp - 2.75 mm x 18 mm DES (3.88mm) , Dr. Ellyn Hack  . LEFT HEART CATHETERIZATION WITH CORONARY ANGIOGRAM N/A 11/23/2012   Procedure: LEFT HEART CATHETERIZATION WITH CORONARY ANGIOGRAM;  Surgeon: Leonie Man, MD;  Location: [facility];  Service: Cardiovascular: NSTEMI: Culprit = mid RCA 95% & 75%; LAD ~40-50%, Small RI ~60%; EF ~60%, mild inf-basal HK     . TRANSTHORACIC ECHOCARDIOGRAM  January 2017; March 2017   a. EF 35-40%. Entire inferior hypokinesis and akinesis of the basal and mid inferolateral and anterolateral /distal lateral walls;; b. EF 40 and 45%. Akinesis of basal-mid inferolateral wall. Hypokinesis of entire lateral wall.   Cath - RCA PCI to Cx Jan 2017: 3.0 x 15 Xience drug-eluting stent (3.6 mm)     Current Meds  Medication Sig  . aspirin EC 81 MG tablet Take 81 mg by mouth as needed.   . Cholecalciferol (D3-1000) 1000 UNITS capsule Take 1,000 Units by mouth 2 (two) times a week.    . clopidogrel (PLAVIX) 75 MG tablet TAKE 1 TABLET BY MOUTH DAILY.  Marland Kitchen Cyanocobalamin (VITAMIN B-12) 2500 MCG SUBL Place 1 tablet under the tongue once a week. Reported on 06/25/2015  . ferrous sulfate 325 (65 FE) MG tablet Take 325 mg by mouth 3 (three) times a week.   Marland Kitchen FOLIC ACID PO Take 1 tablet by mouth 3 (three) times a week.  . hydrochlorothiazide (HYDRODIURIL) 12.5 MG tablet Take 12.5 mg by mouth as needed.  Marland Kitchen L-Arginine 500 MG CAPS Take 1 capsule by mouth  every 30 (thirty) days. Reported on 06/25/2015  . lisinopril (PRINIVIL,ZESTRIL) 2.5 MG tablet Take 1 tablet (2.5 mg total) by mouth daily.  . metFORMIN (GLUCOPHAGE) 500 MG tablet Take 250 mg by mouth 2 (two) times daily with a meal.  . metoprolol succinate (TOPROL-XL) 25 MG 24 hr tablet Take 0.5 tablets (12.5 mg total) by mouth daily. TAKE 1 TABLET(25 MG) BY MOUTH DAILY  . Multiple Vitamins-Minerals (ZINC PO) Take 1 tablet by mouth every 30 (thirty) days.  . nitroGLYCERIN (NITROSTAT) 0.4 MG SL tablet Place 1 tablet (0.4 mg total) under the tongue every 5 (five) minutes as needed for chest pain.  Marland Kitchen OVER THE COUNTER MEDICATION Take 1 tablet by mouth 3 (three) times a week. Reported on 06/25/2015  . pantoprazole (PROTONIX) 40 MG tablet TAKE 1 TABLET (40 MG TOTAL) BY MOUTH 2 (TWO) TIMES DAILY.  Marland Kitchen pyridoxine (B-6) 100 MG tablet Take 100 mg by mouth 3 (three) times a week. Reported on 06/25/2015  . simvastatin (ZOCOR) 40 MG tablet Take 1 tablet (40 mg total) by mouth every other day.  Marland Kitchen VITAMIN A PO Take 1 capsule by mouth 3 (three) times a week.  Marland Kitchen  vitamin C (ASCORBIC ACID) 500 MG tablet Take 500 mg by mouth 3 (three) times a week.   . [DISCONTINUED] metoprolol succinate (TOPROL-XL) 25 MG 24 hr tablet TAKE 1 TABLET(25 MG) BY MOUTH DAILY    No Known Allergies  Social History   Social History  . Marital status: Married    Spouse name: N/A  . Number of children: N/A  . Years of education: N/A   Social History Main Topics  . Smoking status: Former Smoker    Packs/day: 0.25    Years: 2.00    Types: Cigarettes    Quit date: 06/07/1963  . Smokeless tobacco: Never Used  . Alcohol use No     Comment: Occasionally drinks red wine  . Drug use: No  . Sexual activity: Not Asked   Other Topics Concern  . None   Social History Narrative   Currently in Cardiac Rehabilitation   "former smoker". Does not drink EtOH   Married.    family history includes Cancer in his father; Diabetes in his  mother.  Wt Readings from Last 3 Encounters:  10/24/16 163 lb 12.8 oz (74.3 kg)  05/11/16 172 lb (78 kg)  04/27/16 172 lb 9.6 oz (78.3 kg)    PHYSICAL EXAM BP (!) 122/58   Pulse (!) 48   Ht 5\' 9"  (1.753 m)   Wt 163 lb 12.8 oz (74.3 kg)   BMI 24.19 kg/m  General appearance: alert, cooperative, appears stated age, no distress. Well-nourished and well-groomed. HEENT: [place]/AT, EOMI, MMM, anicteric sclera Neck: no adenopathy, no carotid bruit and no JVD Lungs: clear to auscultation bilaterally, normal percussion bilaterally and non-labored Heart: regular rate and rhythm, S1 &S2 normal, no murmur, click, rub or gallop; nondisplaced PMI Abdomen: soft, non-tender; bowel sounds normal; no masses,  no organomegaly; no HJR Extremities: extremities normal, atraumatic, no cyanosis, and edema - 1-2+ bilaterally Pulses: 2+ and symmetric;  Skin: mobility and turgor normal, no evidence of bleeding or bruising, no lesions noted, temperature normal and texture normal or  Neurologic: Mental status: Alert & oriented x 3, thought content appropriate - somewhat slow in answering questions. But does answer appropriately. non-focal exam.  Pleasant mood & affect.   Adult ECG Report  Rate: 46 ;  Rhythm: sinus bradycardia and Otherwise normal axis, intervals and durations.;   Narrative Interpretation: Stable other than bradycardia   Other studies Reviewed: Additional studies/ records that were reviewed today include:  Recent Labs:    Lab Results  Component Value Date   CHOL 140 01/13/2016   HDL 30 (L) 01/13/2016   LDLCALC 69 01/13/2016   TRIG 203 (H) 01/13/2016   CHOLHDL 5.0 06/16/2015    ASSESSMENT / PLAN: Problem List Items Addressed This Visit    Benign essential HTN (Chronic)    Well-controlled on low-dose lisinopril and metoprolol plus low-dose HCTZ. With bradycardia, plan is to reduce Toprol to 12.5 mg daily..      Relevant Medications   metoprolol succinate (TOPROL-XL) 25 MG 24 hr tablet    Other Relevant Orders   EKG 12-Lead (Completed)   Hepatic function panel   CAD S/P PCI-- RCA DES 2014,  CFX DES Jan 2017 - Primary (Chronic)    No active anginal symptoms. Very active. Continues to improve from his initial post PCI symptoms.  Was converted from [place] back to Plavix probably related to financial issues and bruising. At this point, I think he is safe holding aspirin if necessary for bruising.  Only able to tolerate low-dose  Toprol which I'm reducing to 12.5 mg daily. On low-dose ACE inhibitor as well along with statin that he has been taking every other day      Relevant Medications   metoprolol succinate (TOPROL-XL) 25 MG 24 hr tablet   Other Relevant Orders   EKG 12-Lead (Completed)   Dyslipidemia, goal LDL below 70 - on simvastatin; monitored by PCP (Chronic)    He should be due for labs to be checked soon. He is not sure when to I will order the labs to be drawn at his PCPs office if necessary. He is on simvastatin which I would like to switch to atorvastatin or Crestor because of his memory issues, but this keeps being switched back. --> We can discuss converting to Crestor at follow-up visit. On last check his LDL was at goal, but HDL remains which is probably able to be increased.      Relevant Medications   metoprolol succinate (TOPROL-XL) 25 MG 24 hr tablet   Other Relevant Orders   Lipid panel   Hepatic function panel   Ischemic cardiomyopathy (Chronic)    EF now 40-45% which is an improvement after his initial MI. Expected wall motion abnormality. Only able tolerate low-dose beta blocker and ACE inhibitor. Euvolemic on exam therefore no recurrent upper diuretic.  As long as his edema is stable with low-dose HCTZ which he seems to be tolerating okay.      Relevant Medications   metoprolol succinate (TOPROL-XL) 25 MG 24 hr tablet   Other Relevant Orders   EKG 12-Lead (Completed)   Hepatic function panel   Lower leg edema (Chronic)    With no heart  failure symptoms, I suspect this is just chronic venous stasis edema. On low-dose HCTZ with support stockings for prolonged standing.      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 Did have some improvement of overall EF but with regional wall motion abnormalities suggesting large scar in the distal circumflex territory. This probably carried on original damage from his non-STEMI in the past.  Doing very well overall without any failure symptoms or angina.      Relevant Medications   metoprolol succinate (TOPROL-XL) 25 MG 24 hr tablet   NSTEMI (non-ST elevated myocardial infarction) - history of (Chronic)   Relevant Medications   metoprolol succinate (TOPROL-XL) 25 MG 24 hr tablet   Other Relevant Orders   EKG 12-Lead (Completed)      Current medicines are reviewed at length with the patient today. (+/- concerns) none The following changes have been made: - Reduced Toprol dose  Patient Instructions  MEDICATION CHANGE DECREASE METOPROLOL SUCCINATE TO 1/2 TABLET DAILY (12.5 MG)     LABS- DO NOT EAT OR DRINK THE MORNING OF THE TEST LIPIDS  HEPATIC PANEL    Your physician wants you to follow-up in: [place] DR Ellyn Hack You will receive a reminder letter in the mail two months in advance. If you don't receive a letter, please call our office to schedule the follow-up appointment.    If you need a refill on your cardiac medications before your next appointment, please call your pharmacy.    Studies Ordered:   Orders Placed This Encounter  Procedures  . Lipid panel  . Hepatic function panel  . EKG 12-Lead      Glenetta Hew, M.D., M.S. Interventional Cardiologist   Pager # [phone]  Phone # [phone] [address] [place], [place] [address]

## 2016-10-26 ENCOUNTER — Encounter: Payer: Self-pay | Admitting: Cardiology

## 2016-10-26 NOTE — Assessment & Plan Note (Signed)
He should be due for labs to be checked soon. He is not sure when to I will order the labs to be drawn at his PCPs office if necessary. He is on simvastatin which I would like to switch to atorvastatin or Crestor because of his memory issues, but this keeps being switched back. --> We can discuss converting to Crestor at follow-up visit. On last check his LDL was at goal, but HDL remains which is probably able to be increased.

## 2016-10-26 NOTE — Assessment & Plan Note (Addendum)
Well-controlled on low-dose lisinopril and metoprolol plus low-dose HCTZ. With bradycardia, plan is to reduce Toprol to 12.5 mg daily.Marland Kitchen

## 2016-10-26 NOTE — Assessment & Plan Note (Signed)
He did have a significant infarct back in January on top of his previous events. Did have some improvement of overall EF but with regional wall motion abnormalities suggesting large scar in the distal circumflex territory. This probably carried on original damage from his non-STEMI in the past.  Doing very well overall without any failure symptoms or angina.

## 2016-10-26 NOTE — Assessment & Plan Note (Signed)
With no heart failure symptoms, I suspect this is just chronic venous stasis edema. On low-dose HCTZ with support stockings for prolonged standing.

## 2016-10-26 NOTE — Assessment & Plan Note (Addendum)
EF now 40-45% which is an improvement after his initial MI. Expected wall motion abnormality. Only able tolerate low-dose beta blocker and ACE inhibitor. Euvolemic on exam therefore no recurrent upper diuretic.  As long as his edema is stable with low-dose HCTZ which he seems to be tolerating okay.

## 2016-10-26 NOTE — Assessment & Plan Note (Signed)
No active anginal symptoms. Very active. Continues to improve from his initial post PCI symptoms.  Was converted from [place] back to Plavix probably related to financial issues and bruising. At this point, I think he is safe holding aspirin if necessary for bruising.  Only able to tolerate low-dose Toprol which I'm reducing to 12.5 mg daily. On low-dose ACE inhibitor as well along with statin that he has been taking every other day

## 2016-10-27 ENCOUNTER — Encounter (HOSPITAL_COMMUNITY): Payer: Medicare HMO

## 2016-10-27 ENCOUNTER — Encounter (HOSPITAL_COMMUNITY): Payer: Medicare HMO | Attending: Adult Health | Admitting: Adult Health

## 2016-10-27 ENCOUNTER — Encounter (HOSPITAL_COMMUNITY): Payer: Self-pay | Admitting: Adult Health

## 2016-10-27 VITALS — BP 144/68 | HR 53 | Resp 16 | Ht 69.0 in | Wt 161.0 lb

## 2016-10-27 DIAGNOSIS — C9110 Chronic lymphocytic leukemia of B-cell type not having achieved remission: Secondary | ICD-10-CM

## 2016-10-27 DIAGNOSIS — C9190 Lymphoid leukemia, unspecified not having achieved remission: Secondary | ICD-10-CM | POA: Insufficient documentation

## 2016-10-27 LAB — CBC WITH DIFFERENTIAL/PLATELET
Basophils Absolute: 0 10*3/uL (ref 0.0–0.1)
Basophils Relative: 0 %
Eosinophils Absolute: 0 10*3/uL (ref 0.0–0.7)
Eosinophils Relative: 0 %
HCT: 45.7 % (ref 39.0–52.0)
Hemoglobin: 14.4 g/dL (ref 13.0–17.0)
Lymphocytes Relative: 91 %
Lymphs Abs: 42 10*3/uL — ABNORMAL HIGH (ref 0.7–4.0)
MCH: 30.4 pg (ref 26.0–34.0)
MCHC: 31.5 g/dL (ref 30.0–36.0)
MCV: 96.4 fL (ref 78.0–100.0)
Monocytes Absolute: 0.9 10*3/uL (ref 0.1–1.0)
Monocytes Relative: 2 %
Neutro Abs: 3.2 10*3/uL (ref 1.7–7.7)
Neutrophils Relative %: 7 %
Platelets: 176 10*3/uL (ref 150–400)
RBC: 4.74 MIL/uL (ref 4.22–5.81)
RDW: 14.9 % (ref 11.5–15.5)
WBC: 46.1 10*3/uL — ABNORMAL HIGH (ref 4.0–10.5)

## 2016-10-27 LAB — COMPREHENSIVE METABOLIC PANEL
ALT: 13 U/L — ABNORMAL LOW (ref 17–63)
AST: 25 U/L (ref 15–41)
Albumin: 4.2 g/dL (ref 3.5–5.0)
Alkaline Phosphatase: 49 U/L (ref 38–126)
Anion gap: 4 — ABNORMAL LOW (ref 5–15)
BUN: 12 mg/dL (ref 6–20)
CO2: 30 mmol/L (ref 22–32)
Calcium: 9.3 mg/dL (ref 8.9–10.3)
Chloride: 105 mmol/L (ref 101–111)
Creatinine, Ser: 1.38 mg/dL — ABNORMAL HIGH (ref 0.61–1.24)
GFR calc Af Amer: 57 mL/min — ABNORMAL LOW (ref >60)
GFR calc non Af Amer: 50 mL/min — ABNORMAL LOW (ref >60)
Glucose, Bld: 101 mg/dL — ABNORMAL HIGH (ref 65–99)
Potassium: 4 mmol/L (ref 3.5–5.1)
Sodium: 139 mmol/L (ref 135–145)
Total Bilirubin: 1.1 mg/dL (ref 0.3–1.2)
Total Protein: 6.9 g/dL (ref 6.5–8.1)

## 2016-10-27 LAB — LACTATE DEHYDROGENASE: LDH: 133 U/L (ref 98–192)

## 2016-10-27 NOTE — Progress Notes (Signed)
[facility], [place] [address]   CLINIC:  Medical Oncology/Hematology  PCP:  [name], MD [place] [address] [phone]   REASON FOR VISIT:  Follow-up for CLL  CURRENT THERAPY: Observation   INTERVAL HISTORY:  [name] 71 y.o. male presents to cancer center today for continued follow-up for CLL.    Overall, he tells me he has been feeling quite well. His appetite is 100%; energy levels are 75%. He denies any pain. He has been working hard to exercise more regularly; he walks for exercise 3-4 times per week as well as going to the fitness center 1-2 times per week. He has been trying to lose weight over the last several months, by eating healthier and exercising. I reviewed; he has lost approximately 11 pounds in the last 6 months.  Denies any fever, night sweats, new or worsening enlarged lymph nodes in his neck, axilla, or groin. Denies severe fatigue. Denies any bleeding issues, including blood in his stools, hematuria, nosebleeds, or gingival bleeding. Denies any dizziness, headaches, or recent falls. Endorses occasional itching on his back, which has been chronic and largely unchanged. Also reports continued acid reflux, which has also been a chronic and ongoing problem for [name].  Otherwise, he is largely without complaints today.   REVIEW OF SYSTEMS:  Review of Systems  Constitutional: Negative.  Negative for chills, diaphoresis, fatigue and fever.  HENT:   Positive for tinnitus (hearing aid problems ). Negative for lump/mass and nosebleeds.   Eyes: Negative.   Respiratory: Negative.  Negative for cough and shortness of breath.   Cardiovascular: Negative.  Negative for chest pain and leg swelling.  Gastrointestinal: Negative for abdominal pain, blood in stool, constipation, diarrhea, nausea and vomiting.       Acid reflux  Endocrine: Negative.   Genitourinary: Negative.  Negative for dysuria and  hematuria.   Musculoskeletal: Negative.  Negative for arthralgias.  Skin: Negative.  Negative for rash.  Neurological: Negative.  Negative for dizziness and headaches.  Hematological: Negative.  Negative for adenopathy. Does not bruise/bleed easily.  Psychiatric/Behavioral: Negative.  Negative for depression and sleep disturbance. The patient is not nervous/anxious.      PAST MEDICAL/SURGICAL HISTORY:  Past Medical History:  Diagnosis Date  . Acute MI, inferolateral wall, initial episode of care ([facility]) 06/16/15   99% dCx   . BPH (benign prostatic hyperplasia) 04/08/2011  . CAD S/P percutaneous coronary angioplasty 11/23/12; 06/16/15   a. 2 overlapping mRCA lesions - Xience Xpedition DES 2.75 mm x 18 mm (3.0 mm);; b. Inf-Lat STEMI 06/16/2015 - dCx Asp Thrombectomy & PCI 3.0 x 15 Xience drDES (3.6 mm)  . Chronic headache   . CLL (chronic lymphocytic leukemia) ([facility]) 04/08/2011  . Depression   . Diverticulitis   . Gallstone   . GERD (gastroesophageal reflux disease)   . Hypercholesteremia   . Ischemic cardiomyopathy    Echo 06/18/2015 EF 35-40% after lateral MI -> repeat echo 08/06/2015: EF 40-45% with basal-mid inferolateral akinesis and hypokinesis of the lateral wall.  . NSTEMI (non-ST elevated myocardial infarction) ([facility]) 11/23/12   NSTEMI 11/23/2012 RCA lesion - PCI with DES; moderate LAD disease  . PTSD (post-traumatic stress disorder)    With Depression - since MI   Past Surgical History:  Procedure Laterality Date  . APPENDECTOMY  1968-70  . CARDIAC CATHETERIZATION N/A 06/16/2015   Procedure: Left Heart Cath and Coronary Angiography;  Surgeon: Jettie Booze, MD;  Location: [facility];  Service: Cardiovascular: 99% thrombotic dCx -> asp thrombectomy & PCI. RCA Stents Patent.   Marland Kitchen CARDIAC CATHETERIZATION N/A 06/16/2015   Procedure: Coronary Stent Intervention;  Surgeon: Jettie Booze, MD;  Location: [facility];  Service: Cardiovascular: PCI dCx = thrombectomy ->  3.0 x 15 Xience drug-eluting stent (3.6 mm)   . COLONOSCOPY  2008   GHW:[id number]-[id number] diverticula, diminutive polyp in the cecum, s/p bx Normal rectum. Tubular adenoma  . COLONOSCOPY N/A 10/17/2012   Dr. Gala Romney: colonic diverticulosis, tubular adenoma, surveillance due May 2019  . CORONARY STENT PLACEMENT  11/23/12   Mid RCA -- Xience eXp - 2.75 mm x 18 mm DES (3.64mm) , Dr. Ellyn Hack  . LEFT HEART CATHETERIZATION WITH CORONARY ANGIOGRAM N/A 11/23/2012   Procedure: LEFT HEART CATHETERIZATION WITH CORONARY ANGIOGRAM;  Surgeon: Leonie Man, MD;  Location: [facility] C[facility];  Service: Cardiovascular: NSTEMI: Culprit = mid RCA 95% & 75%; LAD ~40-50%, Small RI ~60%; EF ~60%, mild inf-basal HK     . TRANSTHORACIC ECHOCARDIOGRAM  January 2017; March 2017   a. EF 35-40%. Entire inferior hypokinesis and akinesis of the basal and mid inferolateral and anterolateral /distal lateral walls;; b. EF 40 and 45%. Akinesis of basal-mid inferolateral wall. Hypokinesis of entire lateral wall.     SOCIAL HISTORY:  Social History   Social History  . Marital status: Married    Spouse name: N/A  . Number of children: N/A  . Years of education: N/A   Occupational History  . Not on file.   Social History Main Topics  . Smoking status: Former Smoker    Packs/day: 0.25    Years: 2.00    Types: Cigarettes    Quit date: 06/07/1963  . Smokeless tobacco: Never Used  . Alcohol use No     Comment: Occasionally drinks red wine  . Drug use: No  . Sexual activity: Not on file   Other Topics Concern  . Not on file   Social History Narrative   Currently in Cardiac Rehabilitation   "former smoker". Does not drink EtOH   Married.    FAMILY HISTORY:  Family History  Problem Relation Age of Onset  . Diabetes Mother   . Cancer Father   . Colon cancer Neg Hx     CURRENT MEDICATIONS:  Outpatient Encounter Prescriptions as of 10/27/2016  Medication Sig Note  . aspirin EC 81 MG tablet Take 81 mg by mouth as needed.    .  Cholecalciferol (D3-1000) 1000 UNITS capsule Take 1,000 Units by mouth 2 (two) times a week.     . clopidogrel (PLAVIX) 75 MG tablet TAKE 1 TABLET BY MOUTH DAILY.   . ferrous sulfate 325 (65 FE) MG tablet Take 325 mg by mouth 3 (three) times a week.    Marland Kitchen FOLIC ACID PO Take 1 tablet by mouth 3 (three) times a week.   . hydrochlorothiazide (HYDRODIURIL) 12.5 MG tablet Take 12.5 mg by mouth as needed. 04/12/2016: Received from: External Pharmacy  . L-Arginine 500 MG CAPS Take 1 capsule by mouth every 30 (thirty) days. Reported on 06/25/2015 06/16/2015: Pt takes his vitamins sporadically  . lisinopril (PRINIVIL,ZESTRIL) 2.5 MG tablet Take 1 tablet (2.5 mg total) by mouth daily.   . metFORMIN (GLUCOPHAGE) 500 MG tablet Take 250 mg by mouth 2 (two) times daily with a meal.   . metoprolol succinate (TOPROL-XL) 25 MG 24 hr tablet Take 0.5 tablets (12.5 mg total) by mouth daily. TAKE  1 TABLET(25 MG) BY MOUTH DAILY   . Multiple Vitamins-Minerals (ZINC PO) Take 1 tablet by mouth every 30 (thirty) days. 06/16/2015: Pt takes his vitamins sporadically  . nitroGLYCERIN (NITROSTAT) 0.4 MG SL tablet Place 1 tablet (0.4 mg total) under the tongue every 5 (five) minutes as needed for chest pain.   Marland Kitchen OVER THE COUNTER MEDICATION Take 1 tablet by mouth 3 (three) times a week. Reported on 06/25/2015 06/16/2015: Pt takes his vitamins sporadically  . pantoprazole (PROTONIX) 40 MG tablet TAKE 1 TABLET (40 MG TOTAL) BY MOUTH 2 (TWO) TIMES DAILY.   Marland Kitchen pyridoxine (B-6) 100 MG tablet Take 100 mg by mouth 3 (three) times a week. Reported on 06/25/2015 06/16/2015: Pt takes his vitamins sporadically  . simvastatin (ZOCOR) 40 MG tablet Take 1 tablet (40 mg total) by mouth every other day.   Marland Kitchen VITAMIN A PO Take 1 capsule by mouth 3 (three) times a week. 06/16/2015: Pt takes his vitamins sporadically  . vitamin C (ASCORBIC ACID) 500 MG tablet Take 500 mg by mouth 3 (three) times a week.  06/16/2015: Pt takes his vitamins sporadically  .  Cyanocobalamin (VITAMIN B-12) 2500 MCG SUBL Place 1 tablet under the tongue once a week. Reported on 06/25/2015    No facility-administered encounter medications on file as of 10/27/2016.     ALLERGIES:  No Known Allergies   PHYSICAL EXAM:  ECOG Performance status: 1 - Symptomatic, but independent.   Vitals:   10/27/16 1341  BP: (!) 144/68  Pulse: (!) 53  Resp: 16   Filed Weights   10/27/16 1341  Weight: 161 lb (73 kg)    Physical Exam  Constitutional: He is oriented to person, place, and time and well-developed, well-nourished, and in no distress.  HENT:  Head: Normocephalic.  Mouth/Throat: Oropharynx is clear and moist.  Eyes: Conjunctivae are normal. Pupils are equal, round, and reactive to light. No scleral icterus.  Neck: Normal range of motion. Neck supple.  Cardiovascular: Normal rate and regular rhythm.   Pulmonary/Chest: Effort normal and breath sounds normal. No respiratory distress. He has no wheezes. He has no rales.  Abdominal: Soft. Bowel sounds are normal. There is no tenderness. There is no rebound and no guarding.  Musculoskeletal: Normal range of motion. He exhibits edema (2+ BLE pitting edema ).  Lymphadenopathy:    He has cervical adenopathy (Very subtle cervical adenopathy noted to (L) posterior cervical chain (measuring about 1 cm); also noted to have (R) cervical adenopathy that is sub-cm. ).    He has no axillary adenopathy.       Right: No supraclavicular adenopathy present.       Left: No supraclavicular adenopathy present.  Neurological: He is alert and oriented to person, place, and time. No cranial nerve deficit.  Skin: Skin is warm and dry. No rash noted.  Psychiatric: Mood, memory, affect and judgment normal.  Nursing note and vitals reviewed.    LABORATORY DATA:  I have reviewed the labs as listed.  CBC    Component Value Date/Time   WBC 46.1 (H) 10/27/2016 1250   RBC 4.74 10/27/2016 1250   HGB 14.4 10/27/2016 1250   HCT 45.7  10/27/2016 1250   PLT 176 10/27/2016 1250   MCV 96.4 10/27/2016 1250   MCH 30.4 10/27/2016 1250   MCHC 31.5 10/27/2016 1250   RDW 14.9 10/27/2016 1250   LYMPHSABS 42.0 (H) 10/27/2016 1250   MONOABS 0.9 10/27/2016 1250   EOSABS 0.0 10/27/2016 1250   BASOSABS 0.0  10/27/2016 1250   CMP Latest Ref Rng & Units 10/27/2016 04/27/2016 01/13/2016  Glucose 65 - 99 mg/dL 101(H) 111(H) 102(H)  BUN 6 - 20 mg/dL 12 14 16   Creatinine 0.61 - 1.24 mg/dL 1.38(H) 1.35(H) 1.31(H)  Sodium 135 - 145 mmol/L 139 141 141  Potassium 3.5 - 5.1 mmol/L 4.0 4.2 4.6  Chloride 101 - 111 mmol/L 105 108 104  CO2 22 - 32 mmol/L 30 28 26   Calcium 8.9 - 10.3 mg/dL 9.3 9.2 9.0  Total Protein 6.5 - 8.1 g/dL 6.9 6.7 6.4  Total Bilirubin 0.3 - 1.2 mg/dL 1.1 0.9 0.6  Alkaline Phos 38 - 126 U/L 49 45 56  AST 15 - 41 U/L 25 25 26   ALT 17 - 63 U/L 13(L) 12(L) 15   Results for [name], [name] (MRN [id number])   Ref. Range 10/27/2016 12:50  LDH Latest Ref Range: 98 - 192 U/L 133   PENDING LABS:    DIAGNOSTIC IMAGING:    PATHOLOGY:            ASSESSMENT & PLAN:   CLL:  -Remains on observation alone at this time.  -Subtle palpable bilateral cervical adenopathy appreciated on exam today (previous documentation by Dr. Whitney Muse noted submandibular adenopathy).  However, these cervical lymph nodes are 0.5-1 cm. Clinically, he is doing very well. Denies any night sweats, fevers, or severe fatigue. He has lost some weight (about 11 lbs), but this has been intentional by his report with healthy diet and regular exercise. WBCs are stable.  No anemia or thrombocytopenia. LDH normal.  -Recommend continued observation.  -Return to cancer center in 6 months with labs.  I gave him strict instructions to call us if he has night sweats, severe fatigue, weight loss without trying, or fever without infection.   NCCN guidelines for indication for treatment of CLL are:  A. Eligible for clinical trial  B. Significant disease-related  symptoms   1. Fatigue (severe)   2. Night sweats   3. Weight loss   4. Fever without infection  C. Threatened end-organ function  D. Progressive bulky disease (spleen >6cm below costal margin, lymph nodes >10 cm)  E. Progressive anemia  F. Progressive thrombocytopenia.   Health maintenance/Wellness promotion:  -Encouraged him to keep up the good work with regards to his healthy diet and exercise.   -Recommended continued follow-up with PCP for continued health maintenance (physical exams, age/gender appropriate cancer screenings, and vaccinations).     Dispo:  -Return to cancer center in 6 months with labs.    All questions were answered to patient's stated satisfaction. Encouraged patient to call with any new concerns or questions before his next visit to the cancer center and we can certain see him sooner, if needed.    Plan of care discussed with Dr. Talbert Cage, who agrees with the above aforementioned.    Orders placed this encounter:  Orders Placed This Encounter  Procedures  . CBC with Differential/Platelet  . Comprehensive metabolic panel  . Lactate dehydrogenase      Mike Craze, NP [place] [phone]

## 2016-10-27 NOTE — Patient Instructions (Addendum)
[facility] at [facility] Discharge Instructions  RECOMMENDATIONS MADE BY THE CONSULTANT AND ANY TEST RESULTS WILL BE SENT TO YOUR REFERRING PHYSICIAN.  You were seen today by Mike Craze NP. Please call us if you start having severe fatigue, night sweats, weight loss without trying, fever with no sign of infection. Return in 6 months for labs and follow up.    Thank you for choosing [facility] at [facility] to provide your oncology and hematology care.  To afford each patient quality time with our provider, please arrive at least 15 minutes before your scheduled appointment time.    If you have a lab appointment with the [facility] please come in thru the  Main Entrance and check in at the main information desk  You need to re-schedule your appointment should you arrive 10 or more minutes late.  We strive to give you quality time with our providers, and arriving late affects you and other patients whose appointments are after yours.  Also, if you no show three or more times for appointments you may be dismissed from the clinic at the providers discretion.     Again, thank you for choosing [facility].  Our hope is that these requests will decrease the amount of time that you wait before being seen by our physicians.       _____________________________________________________________  Should you have questions after your visit to [facility], please contact our office at [phone] between the hours of 8:30 a.m. and 4:30 p.m.  Voicemails left after 4:30 p.m. will not be returned until the following business day.  For prescription refill requests, have your pharmacy contact our office.       Resources For Cancer Patients and their Caregivers ? American Cancer Society: Can assist with transportation, wigs, general needs, runs Look Good Feel Better.        (806)194-8635 ? Cancer Care: Provides financial  assistance, online support groups, medication/co-pay assistance.  1-800-813-HOPE (567)025-0444) ? [facility] Assists [place] cancer patients and their families through emotional , educational and financial support.  414-622-5428 ? [place] DSS Where to apply for food stamps, Medicaid and utility assistance. (908) 799-8786 ? [facility]: Transportation to medical appointments. 267-154-1474 ? Social Security Administration: May apply for disability if have a Stage IV cancer. (918)380-9971 (386) 723-9646 ? [employer], Disability and Transit Services: Assists with nutrition, care and transit needs. [place] Support Programs: @10RELATIVEDAYS @ > Cancer Support Group  2nd Tuesday of the month 1pm-2pm, Journey Room  > Creative Journey  3rd Tuesday of the month 1130am-1pm, Journey Room  > Look Good Feel Better  1st Wednesday of the month 10am-12 noon, Journey Room (Call [place] to register [phone])

## 2016-10-30 ENCOUNTER — Other Ambulatory Visit: Payer: Self-pay | Admitting: Gastroenterology

## 2016-11-05 ENCOUNTER — Other Ambulatory Visit: Payer: Self-pay | Admitting: Cardiology

## 2016-11-07 ENCOUNTER — Ambulatory Visit: Payer: Medicare HMO | Admitting: Cardiology

## 2016-11-15 ENCOUNTER — Other Ambulatory Visit: Payer: Self-pay | Admitting: Cardiology

## 2016-12-15 LAB — LIPID PANEL
Chol/HDL Ratio: 4 ratio (ref 0.0–5.0)
Cholesterol, Total: 129 mg/dL (ref 100–199)
HDL: 32 mg/dL — ABNORMAL LOW (ref >39)
LDL Calculated: 65 mg/dL (ref 0–99)
Triglycerides: 159 mg/dL — ABNORMAL HIGH (ref 0–149)
VLDL Cholesterol Cal: 32 mg/dL (ref 5–40)

## 2016-12-15 LAB — HEPATIC FUNCTION PANEL
ALT: 14 IU/L (ref 0–44)
AST: 23 IU/L (ref 0–40)
Albumin: 4.2 g/dL (ref 3.5–4.8)
Alkaline Phosphatase: 54 IU/L (ref 39–117)
Bilirubin Total: 0.5 mg/dL (ref 0.0–1.2)
Bilirubin, Direct: 0.16 mg/dL (ref 0.00–0.40)
Total Protein: 6.4 g/dL (ref 6.0–8.5)

## 2017-03-30 ENCOUNTER — Other Ambulatory Visit: Payer: Self-pay | Admitting: Cardiology

## 2017-04-12 ENCOUNTER — Other Ambulatory Visit: Payer: Self-pay | Admitting: Cardiology

## 2017-04-22 ENCOUNTER — Other Ambulatory Visit: Payer: Self-pay | Admitting: Nurse Practitioner

## 2017-04-24 ENCOUNTER — Ambulatory Visit: Payer: Medicare HMO | Admitting: Cardiology

## 2017-04-24 ENCOUNTER — Encounter: Payer: Self-pay | Admitting: Cardiology

## 2017-04-24 VITALS — BP 120/64 | HR 56 | Ht 69.0 in | Wt 167.0 lb

## 2017-04-24 DIAGNOSIS — I255 Ischemic cardiomyopathy: Secondary | ICD-10-CM | POA: Diagnosis not present

## 2017-04-24 DIAGNOSIS — I2510 Atherosclerotic heart disease of native coronary artery without angina pectoris: Secondary | ICD-10-CM

## 2017-04-24 DIAGNOSIS — E785 Hyperlipidemia, unspecified: Secondary | ICD-10-CM | POA: Diagnosis not present

## 2017-04-24 DIAGNOSIS — R600 Localized edema: Secondary | ICD-10-CM

## 2017-04-24 DIAGNOSIS — Z9861 Coronary angioplasty status: Secondary | ICD-10-CM

## 2017-04-24 DIAGNOSIS — I10 Essential (primary) hypertension: Secondary | ICD-10-CM

## 2017-04-24 NOTE — Assessment & Plan Note (Signed)
Doing well with no active anginal symptoms.  He initially had PCI to the RCA and then follow-up MI with PCI to the circumflex.  Despite having reduced ejection fraction, no heart failure symptoms. He is now on Plavix with no bleeding issues and financially more secure.  But he can also stop aspirin (he would like to take at least once a month or once a week). He is on low-dose beta-blocker and low-dose ACE inhibitor.  Also on statin.  He has as needed nitroglycerin, but has not needed to use it

## 2017-04-24 NOTE — Progress Notes (Signed)
PCP: [name], MD  Clinic Note: Chief Complaint  Patient presents with  . Follow-up    45-month  . Coronary Artery Disease  . Cardiomyopathy    HPI: [name] is a 71 y.o. male with a PMH below who presents today for Six-month follow-up of CAD-PCI. He is now about 22 months out from his recent inferolateral STEMI in January 2017.  He initially had anginal symptoms with PCI to the RCA in June 2014.  Subsequently had a inferolateral STEMI in January 2017 with significant circumflex disease.  Now has a mildly reduced ejection fraction of 40-45%.   [name] was last seen May 2018-> was doing fairly well. Is working on his fitness level and doing increasing levels of exercise and yardwork. Feeling better than prior to his cath.  No signs of heart failure -just mild edema.  Noted that he tires out quickly.  Recent Hospitalizations: None Studies Personally Reviewed - (if available, images/films reviewed: From Epic Chart or Care Everywhere) - no new studies  No new studies  Interval History: [name] returns today for delayed hospital follow-up actually with no major complaints. He is starting to back to full activity levels -> walks 3-4 days a week couple miles and also goes to gym less fitness center 3 times a week. His activity, he denies any resting or exertional chest tightness/pressure or dyspnea.  He notes is often on side pain along the lateral rib cage on the left. Otherwise no untoward symptoms. No PND, orthopnea with stable mild edema that improves with elevating his feet. No fatigue or exercise intolerance.  No palpitations, lightheadedness, dizziness, weakness or syncope/near syncope. No TIA/amaurosis fugax symptoms. No melena, hematochezia, hematuria, or epstaxis - he does note some easy bruising on the [place]il[place]. No claudication.  ROS: A comprehensive was performed. Review of Systems  Constitutional: Negative for malaise/fatigue and weight loss.  HENT:  Negative for congestion and nosebleeds.   Respiratory: Negative for cough, shortness of breath and wheezing.   Gastrointestinal: Negative.   Genitourinary: Negative.   Musculoskeletal: Negative for falls and myalgias.  Neurological: Negative for dizziness (Just occasional orthostatic dizziness) and focal weakness.  Endo/Heme/Allergies: Negative for environmental allergies. Bruises/bleeds easily.  Psychiatric/Behavioral: Positive for memory loss (Stable.). The patient is not nervous/anxious and does not have insomnia.   All other systems reviewed and are negative.  I have reviewed and (if needed) personally updated the patient's problem list, medications, allergies, past medical and surgical history, social and family history.   Past Medical History:  Diagnosis Date  . Acute MI, inferolateral wall, initial episode of care ([place]) 06/16/15   99% dCx   . BPH (benign prostatic hyperplasia) 04/08/2011  . CAD S/P percutaneous coronary angioplasty 11/23/12; 06/16/15   a. 2 overlapping mRCA lesions - Xience Xpedition DES 2.75 mm x 18 mm (3.0 mm);; b. Inf-Lat STEMI 06/16/2015 - dCx Asp Thrombectomy & PCI 3.0 x 15 Xience drDES (3.6 mm)  . Chronic headache   . CLL (chronic lymphocytic leukemia) ([place]) 04/08/2011  . Depression   . Diverticulitis   . Gallstone   . GERD (gastroesophageal reflux disease)   . Hypercholesteremia   . Ischemic cardiomyopathy    Echo 06/18/2015 EF 35-40% after lateral MI -> repeat echo 08/06/2015: EF 40-45% with basal-mid inferolateral akinesis and hypokinesis of the lateral wall.  . NSTEMI (non-ST elevated myocardial infarction) ([place]) 11/23/12   NSTEMI 11/23/2012 RCA lesion - PCI with DES; moderate LAD disease  . PTSD (post-traumatic stress disorder)  With Depression - since MI    Past Surgical History:  Procedure Laterality Date  . APPENDECTOMY  1968-70  . COLONOSCOPY  2008   ONG:[id number] diverticula, diminutive polyp in the cecum, s/p bx Normal rectum. Tubular adenoma    . COLONOSCOPY N/A 10/17/2012   Performed by Daneil Dolin, MD at [facility]  . Coronary Stent Intervention N/A 06/16/2015   Performed by Jettie Booze, MD at [facility]  . CORONARY STENT PLACEMENT  11/23/12   Mid RCA -- Xience eXp - 2.75 mm x 18 mm DES (3.2mm) , Dr. Ellyn Hack  . Left Heart Cath and Coronary Angiography N/A 06/16/2015   Performed by Jettie Booze, MD at [facility]  . LEFT HEART CATHETERIZATION WITH CORONARY ANGIOGRAM N/A 11/23/2012   Performed by Leonie Man, MD at [facility]  . TRANSTHORACIC ECHOCARDIOGRAM  January 2017; March 2017   a. EF 35-40%. Entire inferior hypokinesis and akinesis of the basal and mid inferolateral and anterolateral /distal lateral walls;; b. EF 40 and 45%. Akinesis of basal-mid inferolateral wall. Hypokinesis of entire lateral wall.   Cath - RCA PCI to Cx Jan 2017: 3.0 x 15 Xience drug-eluting stent (3.6 mm)     Current Meds  Medication Sig  . Cholecalciferol (D3-1000) 1000 UNITS capsule Take 1,000 Units by mouth 2 (two) times a week.    . clopidogrel (PLAVIX) 75 MG tablet TAKE 1 TABLET BY MOUTH DAILY.  Marland Kitchen Cyanocobalamin (VITAMIN B-12) 2500 MCG SUBL Place 1 tablet under the tongue once a week. Reported on 06/25/2015  . ferrous sulfate 325 (65 FE) MG tablet Take 325 mg by mouth 3 (three) times a week.   Marland Kitchen FOLIC ACID PO Take 1 tablet by mouth 3 (three) times a week.  . hydrochlorothiazide (HYDRODIURIL) 12.5 MG tablet Take 12.5 mg by mouth as needed.  Marland Kitchen L-Arginine 500 MG CAPS Take 1 capsule by mouth every 30 (thirty) days. Reported on 06/25/2015  . lisinopril (PRINIVIL,ZESTRIL) 2.5 MG tablet Take 1 tablet (2.5 mg total) by mouth daily.  . metFORMIN (GLUCOPHAGE) 500 MG tablet Take 250 mg by mouth 2 (two) times daily with a meal.  . metoprolol succinate (TOPROL-XL) 25 MG 24 hr tablet Take 0.5 tablets (12.5 mg total) by mouth daily.  . Multiple Vitamins-Minerals (ZINC PO) Take 1 tablet by mouth every 30  (thirty) days.  . nitroGLYCERIN (NITROSTAT) 0.4 MG SL tablet Place 1 tablet (0.4 mg total) under the tongue every 5 (five) minutes as needed for chest pain.  Marland Kitchen OVER THE COUNTER MEDICATION Take 1 tablet by mouth 3 (three) times a week. Reported on 06/25/2015  . pantoprazole (PROTONIX) 40 MG tablet TAKE 1 TABLET (40 MG TOTAL) BY MOUTH 2 (TWO) TIMES DAILY.  Marland Kitchen pyridoxine (B-6) 100 MG tablet Take 100 mg by mouth 3 (three) times a week. Reported on 06/25/2015  . simvastatin (ZOCOR) 40 MG tablet TAKE 1 TABLET (40 MG TOTAL) BY MOUTH EVERY OTHER DAY.  Marland Kitchen VITAMIN A PO Take 1 capsule by mouth 3 (three) times a week.  . vitamin C (ASCORBIC ACID) 500 MG tablet Take 500 mg by mouth 3 (three) times a week.   . [DISCONTINUED] aspirin EC 81 MG tablet Take 81 mg by mouth as needed.     No Known Allergies  Social History   Socioeconomic History  . Marital status: Married    Spouse name: None  . Number of children: None  . Years of education: None  .  Highest education level: None  Social Needs  . Financial resource strain: None  . Food insecurity - worry: None  . Food insecurity - inability: None  . Transportation needs - medical: None  . Transportation needs - non-medical: None  Occupational History  . None  Tobacco Use  . Smoking status: Former Smoker    Packs/day: 0.25    Years: 2.00    Pack years: 0.50    Types: Cigarettes    Last attempt to quit: 06/07/1963    Years since quitting: 53.9  . Smokeless tobacco: Never Used  Substance and Sexual Activity  . Alcohol use: No    Alcohol/week: 0.0 oz    Comment: Occasionally drinks red wine  . Drug use: No  . Sexual activity: None  Other Topics Concern  . None  Social History Narrative   Currently in Cardiac Rehabilitation   "former smoker". Does not drink EtOH   Married.    family history includes Cancer in his father; Diabetes in his mother.  Wt Readings from Last 3 Encounters:  04/24/17 167 lb (75.8 kg)  10/27/16 161 lb (73 kg)    10/24/16 163 lb 12.8 oz (74.3 kg)    PHYSICAL EXAM BP 120/64   Pulse (!) 56   Ht 5\' 9"  (1.753 m)   Wt 167 lb (75.8 kg)   BMI 24.66 kg/m   Physical Exam  Constitutional: He is oriented to person, place, and time. He appears well-developed and well-nourished. No distress.  Well-groomed.  More jovial today than usual.  HENT:  Head: Normocephalic and atraumatic.  Eyes: EOM are normal.  Neck: Normal range of motion. Neck supple. No hepatojugular reflux and no JVD present. Carotid bruit is not present.  Cardiovascular: Normal rate, regular rhythm, normal heart sounds and intact distal pulses.  No extrasystoles are present. PMI is not displaced. Exam reveals no gallop and no friction rub.  No murmur heard. Pulmonary/Chest: Effort normal and breath sounds normal. No respiratory distress. He has no wheezes.  Abdominal: Soft. Bowel sounds are normal. He exhibits no distension. There is no tenderness.  Musculoskeletal: Normal range of motion. He exhibits no edema (1+ bilateral pretibial/ankle edema.).  Neurological: He is alert and oriented to person, place, and time. No cranial nerve deficit.  Skin: Skin is warm and dry. No rash noted. No erythema.  Psychiatric: He has a normal mood and affect. His behavior is normal. Judgment and thought content normal.  Memory seems to be a little bit lacking, but better historian than last time.  Nursing note and vitals reviewed.   Adult ECG Report  n/a  Other studies Reviewed:  Additional studies/ records that were reviewed today include:  Recent Labs:    Lab Results  Component Value Date   CHOL 129 12/14/2016   HDL 32 (L) 12/14/2016   LDLCALC 65 12/14/2016   TRIG 159 (H) 12/14/2016   CHOLHDL 4.0 12/14/2016    ASSESSMENT / PLAN: Problem List Items Addressed This Visit    Benign essential HTN (Chronic)    Well-controlled today on low-dose lisinopril and Toprol.  Cannot titrate Toprol further because of history of bradycardia.  In fact we  reduced it to 12.5 last visit. I have asked that he separate his medication doses in order to avoid hypotension.      CAD S/P PCI-- RCA DES 2014,  CFX DES Jan 2017 - Primary (Chronic)    Doing well with no active anginal symptoms.  He initially had PCI to the RCA and  then follow-up MI with PCI to the circumflex.  Despite having reduced ejection fraction, no heart failure symptoms. He is now on Plavix with no bleeding issues and financially more secure.  But he can also stop aspirin (he would like to take at least once a month or once a week). He is on low-dose beta-blocker and low-dose ACE inhibitor.  Also on statin.  He has as needed nitroglycerin, but has not needed to use it      Dyslipidemia, goal LDL below 70 - on simvastatin; monitored by PCP (Chronic)    Lipids are well controlled on current dose of simvastatin.  No myalgias. His concern for possible memory issues with statin.  Will defer to PCP, but may want to consider switching to either pravastatin or rosuvastatin.      Ischemic cardiomyopathy (Chronic)    Moderate hypokinesis with EF 40-45% improved from initial post MI EF.  He is on very low-dose ACE inhibitor and low-dose beta-blocker.  It seems relatively euvolemic with exception of some mild edema that is treated with as needed HCTZ. No active heart failure symptoms, and otherwise seems euvolemic.      Lower leg edema (Chronic)    Probably related to venous stasis changes.  He uses as needed low-dose HCTZ for swelling.  Also recommended using support stockings or prolonged standing.  Foot elevation.         Current medicines are reviewed at length with the patient today. (+/- concerns) none The following changes have been made: - Reduced Toprol dose  Patient Instructions  MEDICATION CHANGES --- TAKE LISINOPRIL AT MORNING TIME ---TAKE  METOPROLOL AT [place]    Your physician recommends that you schedule a follow-up appointment  in June 2019 WITH EXTENDER- [facility]      Your physician wants you to follow-up in [place] will receive a reminder letter in the mail two months in advance. If you don't receive a letter, please call our office to schedule the follow-up appointment.    If you need a refill on your cardiac medications before your next appointment, please call your pharmacy.     Studies Ordered:   No orders of the defined types were placed in this encounter.     Glenetta Hew, M.D., M.S. Interventional Cardiologist   Pager # [phone] Phone # [phone] [address] [place], [place] [address]

## 2017-04-24 NOTE — Assessment & Plan Note (Signed)
Probably related to venous stasis changes.  He uses as needed low-dose HCTZ for swelling.  Also recommended using support stockings or prolonged standing.  Foot elevation.

## 2017-04-24 NOTE — Assessment & Plan Note (Signed)
Moderate hypokinesis with EF 40-45% improved from initial post MI EF.  He is on very low-dose ACE inhibitor and low-dose beta-blocker.  It seems relatively euvolemic with exception of some mild edema that is treated with as needed HCTZ. No active heart failure symptoms, and otherwise seems euvolemic.

## 2017-04-24 NOTE — Assessment & Plan Note (Signed)
Well-controlled today on low-dose lisinopril and Toprol.  Cannot titrate Toprol further because of history of bradycardia.  In fact we reduced it to 12.5 last visit. I have asked that he separate his medication doses in order to avoid hypotension.

## 2017-04-24 NOTE — Assessment & Plan Note (Signed)
Lipids are well controlled on current dose of simvastatin.  No myalgias. His concern for possible memory issues with statin.  Will defer to PCP, but may want to consider switching to either pravastatin or rosuvastatin.

## 2017-04-24 NOTE — Patient Instructions (Addendum)
MEDICATION CHANGES --- TAKE LISINOPRIL AT MORNING TIME ---TAKE  METOPROLOL AT EVENING TIME    MAY STOP TAKING ASPIRIN    Your physician recommends that you schedule a follow-up appointment in June 2019 WITH EXTENDER- [facility]      Your physician wants you to follow-up in [place] will receive a reminder letter in the mail two months in advance. If you don't receive a letter, please call our office to schedule the follow-up appointment.    If you need a refill on your cardiac medications before your next appointment, please call your pharmacy.

## 2017-05-04 ENCOUNTER — Encounter (HOSPITAL_COMMUNITY): Payer: Medicare HMO | Attending: Oncology

## 2017-05-04 ENCOUNTER — Encounter (HOSPITAL_BASED_OUTPATIENT_CLINIC_OR_DEPARTMENT_OTHER): Payer: Medicare HMO | Admitting: Oncology

## 2017-05-04 ENCOUNTER — Encounter (HOSPITAL_COMMUNITY): Payer: Self-pay | Admitting: Oncology

## 2017-05-04 ENCOUNTER — Other Ambulatory Visit: Payer: Self-pay

## 2017-05-04 VITALS — BP 138/62 | HR 60 | Temp 98.4°F | Resp 18 | Wt 170.3 lb

## 2017-05-04 DIAGNOSIS — C9110 Chronic lymphocytic leukemia of B-cell type not having achieved remission: Secondary | ICD-10-CM | POA: Insufficient documentation

## 2017-05-04 LAB — COMPREHENSIVE METABOLIC PANEL
ALT: 11 U/L — ABNORMAL LOW (ref 17–63)
AST: 22 U/L (ref 15–41)
Albumin: 4.1 g/dL (ref 3.5–5.0)
Alkaline Phosphatase: 51 U/L (ref 38–126)
Anion gap: 6 (ref 5–15)
BUN: 14 mg/dL (ref 6–20)
CO2: 27 mmol/L (ref 22–32)
Calcium: 9.3 mg/dL (ref 8.9–10.3)
Chloride: 109 mmol/L (ref 101–111)
Creatinine, Ser: 1.35 mg/dL — ABNORMAL HIGH (ref 0.61–1.24)
GFR calc Af Amer: 59 mL/min — ABNORMAL LOW (ref >60)
GFR calc non Af Amer: 50 mL/min — ABNORMAL LOW (ref >60)
Glucose, Bld: 128 mg/dL — ABNORMAL HIGH (ref 65–99)
Potassium: 3.9 mmol/L (ref 3.5–5.1)
Sodium: 142 mmol/L (ref 135–145)
Total Bilirubin: 0.9 mg/dL (ref 0.3–1.2)
Total Protein: 6.9 g/dL (ref 6.5–8.1)

## 2017-05-04 LAB — CBC WITH DIFFERENTIAL/PLATELET
Basophils Absolute: 0 10*3/uL (ref 0.0–0.1)
Basophils Relative: 0 %
Eosinophils Absolute: 0.2 10*3/uL (ref 0.0–0.7)
Eosinophils Relative: 0 %
HCT: 44.9 % (ref 39.0–52.0)
Hemoglobin: 13.7 g/dL (ref 13.0–17.0)
Lymphocytes Relative: 91 %
Lymphs Abs: 45.7 10*3/uL — ABNORMAL HIGH (ref 0.7–4.0)
MCH: 29.8 pg (ref 26.0–34.0)
MCHC: 30.5 g/dL (ref 30.0–36.0)
MCV: 97.8 fL (ref 78.0–100.0)
Monocytes Absolute: 0.6 10*3/uL (ref 0.1–1.0)
Monocytes Relative: 1 %
Neutro Abs: 3.8 10*3/uL (ref 1.7–7.7)
Neutrophils Relative %: 8 %
Platelets: 160 10*3/uL (ref 150–400)
RBC: 4.59 MIL/uL (ref 4.22–5.81)
RDW: 14.4 % (ref 11.5–15.5)
WBC: 50.4 10*3/uL (ref 4.0–10.5)

## 2017-05-04 LAB — LACTATE DEHYDROGENASE: LDH: 121 U/L (ref 98–192)

## 2017-05-04 NOTE — Patient Instructions (Addendum)
[facility] at [facility] Discharge Instructions  RECOMMENDATIONS MADE BY THE CONSULTANT AND ANY TEST RESULTS WILL BE SENT TO YOUR REFERRING PHYSICIAN.  You were seen today by Dr. Louise Zhou    Thank you for choosing [facility] at [facility] to provide your oncology and hematology care.  To afford each patient quality time with our provider, please arrive at least 15 minutes before your scheduled appointment time.    If you have a lab appointment with the Cancer Center please come in thru the  Main Entrance and check in at the main information desk  You need to re-schedule your appointment should you arrive 10 or more minutes late.  We strive to give you quality time with our providers, and arriving late affects you and other patients whose appointments are after yours.  Also, if you no show three or more times for appointments you may be dismissed from the clinic at the providers discretion.     Again, thank you for choosing [facility].  Our hope is that these requests will decrease the amount of time that you wait before being seen by our physicians.       _____________________________________________________________  Should you have questions after your visit to [facility], please contact our office at [phone] between the hours of 8:30 a.m. and 4:30 p.m.  Voicemails left after 4:30 p.m. will not be returned until the following business day.  For prescription refill requests, have your pharmacy contact our office.       Resources For Cancer Patients and their Caregivers ? American Cancer Society: Can assist with transportation, wigs, general needs, runs Look Good Feel Better.        1-888-227-6333 ? Cancer Care: Provides financial assistance, online support groups, medication/co-pay assistance.  1-800-813-HOPE (4673) ? [facility] Assists [place] cancer patients and their  families through emotional , educational and financial support.  336-427-4357 ? [place] DSS Where to apply for food stamps, Medicaid and utility assistance. 336-342-1394 ? [facility]: Transportation to medical appointments. 336-347-2287 ? Social Security Administration: May apply for disability if have a Stage IV cancer. 336-342-7796 1-800-772-1213 ? [place] Aging, Disability and Transit Services: Assists with nutrition, care and transit needs. 336-349-2343  Cancer Center Support Programs: @10RELATIVEDAYS@ > Cancer Support Group  2nd Tuesday of the month 1pm-2pm, Journey Room  > Creative Journey  3rd Tuesday of the month 1130am-1pm, Journey Room  > Look Good Feel Better  1st Wednesday of the month 10am-12 noon, Journey Room (Call American Cancer Society to register 1-800-395-5775)    

## 2017-05-04 NOTE — Progress Notes (Signed)
CRITICAL VALUE ALERT  Critical Value:  WBC 50.4  Date & Time Notied:  05/04/17 @ 1460  Provider Notified: Dr. Twana First  Orders Received/Actions taken: no orders received.

## 2017-05-04 NOTE — Progress Notes (Signed)
[facility] Follow up:    [name] Albee, MD [place] [address]   DIAGNOSIS: CLL  CURRENT THERAPY: Observation  INTERVAL HISTORY: [name] 71 y.o. male Patient presents today for continue follow-up.  He states he has been doing well.  He denies any B symptoms including drenching night sweats, unexplained weight loss, fevers or chills.  He has not had any recent infections.  He has not noted any new lymphadenopathy.  He denies any chest pain, shortness of breath, abdominal pain, nausea, vomiting, diarrhea, focal weakness.  Patient Active Problem List   Diagnosis Date Noted  . GERD (gastroesophageal reflux disease) 03/08/2016  . Esophageal dysphagia 03/08/2016  . Chronic cough 03/08/2016  . Ischemic cardiomyopathy   . STEMI - 06/16/15 06/16/2015  . Benign essential HTN 06/16/2015  . Myocardial infarction of inferolateral wall ([place]) 06/16/2015  . Cholelithiasis without cholecystitis 12/18/2013  . Hematochezia 06/04/2013  . Lower leg edema 03/14/2013  . PTSD (post-traumatic stress disorder)   . CAD S/P PCI-- RCA DES 2014,  CFX DES Jan 2017 11/24/2012  . NSTEMI (non-ST elevated myocardial infarction) - history of 11/23/2012  . Hyperglycemia 11/23/2012  . History of smoking 11/23/2012  . Dyslipidemia, goal LDL below 70 - on simvastatin; monitored by PCP 11/23/2012  . Diverticulitis of colon without hemorrhage- on antibiotics for recent flair up 09/19/2012  . Hx of adenomatous colonic polyps 09/19/2012  . CLL (chronic lymphocytic leukemia) ([place]) 04/08/2011  . BPH (benign prostatic hyperplasia) 04/08/2011    has No Known Allergies.  MEDICAL HISTORY: Past Medical History:  Diagnosis Date  . Acute MI, inferolateral wall, initial episode of care ([place]) 06/16/15   99% dCx   . BPH (benign prostatic hyperplasia) 04/08/2011  . CAD S/P percutaneous coronary angioplasty 11/23/12; 06/16/15   a. 2 overlapping mRCA lesions - Xience Xpedition DES  2.75 mm x 18 mm (3.0 mm);; b. Inf-Lat STEMI 06/16/2015 - dCx Asp Thrombectomy & PCI 3.0 x 15 Xience drDES (3.6 mm)  . Chronic headache   . CLL (chronic lymphocytic leukemia) ([place]) 04/08/2011  . Depression   . Diverticulitis   . Gallstone   . GERD (gastroesophageal reflux disease)   . Hypercholesteremia   . Ischemic cardiomyopathy    Echo 06/18/2015 EF 35-40% after lateral MI -> repeat echo 08/06/2015: EF 40-45% with basal-mid inferolateral akinesis and hypokinesis of the lateral wall.  . NSTEMI (non-ST elevated myocardial infarction) ([place]) 11/23/12   NSTEMI 11/23/2012 RCA lesion - PCI with DES; moderate LAD disease  . PTSD (post-traumatic stress disorder)    With Depression - since MI    SURGICAL HISTORY: Past Surgical History:  Procedure Laterality Date  . APPENDECTOMY  1968-70  . CARDIAC CATHETERIZATION N/A 06/16/2015   Procedure: Left Heart Cath and Coronary Angiography;  Surgeon: Jettie Booze, MD;  Location: [facility];  Service: Cardiovascular: 99% thrombotic dCx -> asp thrombectomy & PCI. RCA Stents Patent.   Marland Kitchen CARDIAC CATHETERIZATION N/A 06/16/2015   Procedure: Coronary Stent Intervention;  Surgeon: Jettie Booze, MD;  Location: [facility];  Service: Cardiovascular: PCI dCx = thrombectomy -> 3.0 x 15 Xience drug-eluting stent (3.6 mm)   . COLONOSCOPY  2008   TOI:[id number] diverticula, diminutive polyp in the cecum, s/p bx Normal rectum. Tubular adenoma  . COLONOSCOPY N/A 10/17/2012   Dr. Gala Romney: colonic diverticulosis, tubular adenoma, surveillance due May 2019  . CORONARY STENT PLACEMENT  11/23/12   Mid RCA -- Xience eXp -  2.75 mm x 18 mm DES (3.78mm) , Dr. Ellyn Hack  . LEFT HEART CATHETERIZATION WITH CORONARY ANGIOGRAM N/A 11/23/2012   Procedure: LEFT HEART CATHETERIZATION WITH CORONARY ANGIOGRAM;  Surgeon: Leonie Man, MD;  Location[facility];  Service: Cardiovascular: NSTEMI: Culprit = mid RCA 95% & 75%; LAD ~40-50%, Small RI ~60%; EF ~60%, mild  inf-basal HK     . TRANSTHORACIC ECHOCARDIOGRAM  January 2017; March 2017   a. EF 35-40%. Entire inferior hypokinesis and akinesis of the basal and mid inferolateral and anterolateral /distal lateral walls;; b. EF 40 and 45%. Akinesis of basal-mid inferolateral wall. Hypokinesis of entire lateral wall.    SOCIAL HISTORY: Social History   Socioeconomic History  . Marital status: Married    Spouse name: Not on file  . Number of children: Not on file  . Years of education: Not on file  . Highest education level: Not on file  Social Needs  . Financial resource strain: Not on file  . Food insecurity - worry: Not on file  . Food insecurity - inability: Not on file  . Transportation needs - medical: Not on file  . Transportation needs - non-medical: Not on file  Occupational History  . Not on file  Tobacco Use  . Smoking status: Former Smoker    Packs/day: 0.25    Years: 2.00    Pack years: 0.50    Types: Cigarettes    Last attempt to quit: 06/07/1963    Years since quitting: 53.9  . Smokeless tobacco: Never Used  Substance and Sexual Activity  . Alcohol use: No    Alcohol/week: 0.0 oz    Comment: Occasionally drinks red wine  . Drug use: No  . Sexual activity: Not on file  Other Topics Concern  . Not on file  Social History Narrative   Currently in Cardiac Rehabilitation   "former smoker". Does not drink EtOH   Married.    FAMILY HISTORY: Family History  Problem Relation Age of Onset  . Diabetes Mother   . Cancer Father   . Colon cancer Neg Hx     Review of Systems - Oncology  ROS as per HPI otherwise 12 point ROS is negative.   PHYSICAL EXAMINATION  ECOG PERFORMANCE STATUS: 0 - Asymptomatic  Vitals:   05/04/17 1337  BP: 138/62  Pulse: 60  Resp: 18  Temp: 98.4 F (36.9 C)  SpO2: 100%    Physical Exam Constitutional: Well-developed, well-nourished, and in no distress.   HENT:  Head: Normocephalic and atraumatic.  Mouth/Throat: No oropharyngeal  exudate. Mucosa moist. Eyes: Pupils are equal, round, and reactive to light. Conjunctivae are normal. No scleral icterus.  Neck: Normal range of motion. Neck supple. No JVD present.  Cardiovascular: Normal rate, regular rhythm and normal heart sounds.  Exam reveals no gallop and no friction rub.   No murmur heard. Pulmonary/Chest: Effort normal and breath sounds normal. No respiratory distress. No wheezes.No rales.  Abdominal: Soft. Bowel sounds are normal. No distension. There is no tenderness. There is no guarding.  Musculoskeletal: No edema or tenderness.  Lymphadenopathy:    No cervical or supraclavicular adenopathy. No axillary lymphadenopathy. Neurological: Alert and oriented to person, place, and time. No cranial nerve deficit.  Skin: Skin is warm and dry. No rash noted. No erythema. No pallor.  Psychiatric: Affect and judgment normal.    LABORATORY DATA:  CBC    Component Value Date/Time   WBC 50.4 (HH) 05/04/2017 1250   RBC 4.59 05/04/2017 1250  HGB 13.7 05/04/2017 1250   HCT 44.9 05/04/2017 1250   PLT 160 05/04/2017 1250   MCV 97.8 05/04/2017 1250   MCH 29.8 05/04/2017 1250   MCHC 30.5 05/04/2017 1250   RDW 14.4 05/04/2017 1250   LYMPHSABS 45.7 (H) 05/04/2017 1250   MONOABS 0.6 05/04/2017 1250   EOSABS 0.2 05/04/2017 1250   BASOSABS 0.0 05/04/2017 1250    CMP     Component Value Date/Time   NA 142 05/04/2017 1250   NA 141 01/13/2016 1027   K 3.9 05/04/2017 1250   CL 109 05/04/2017 1250   CO2 27 05/04/2017 1250   GLUCOSE 128 (H) 05/04/2017 1250   BUN 14 05/04/2017 1250   BUN 16 01/13/2016 1027   CREATININE 1.35 (H) 05/04/2017 1250   CREATININE 1.22 10/08/2013 1152   CALCIUM 9.3 05/04/2017 1250   PROT 6.9 05/04/2017 1250   PROT 6.4 12/14/2016 1007   ALBUMIN 4.1 05/04/2017 1250   ALBUMIN 4.2 12/14/2016 1007   AST 22 05/04/2017 1250   ALT 11 (L) 05/04/2017 1250   ALKPHOS 51 05/04/2017 1250   BILITOT 0.9 05/04/2017 1250   BILITOT 0.5 12/14/2016 1007    GFRNONAA 50 (L) 05/04/2017 1250   GFRAA 59 (L) 05/04/2017 1250       PENDING LABS:   RADIOGRAPHIC STUDIES:  No results found.   PATHOLOGY:     ASSESSMENT:  CLL  PLAN: -Reviewed labs in detail with the patient today.  He has had an increase in his WBC from 46K to 50 K, however hemoglobin and platelets remained stable.  He is not had worsening B symptoms or worsening lymphadenopathy. -Recommend to continue observation at this time.  No indication to start therapy.  If he should progress in the future with worsening anemia with hemoglobin less than 10 or platelet count less than 100,000 or becomes very symptomatic from CLL, recommend to start Imbruvica at that time. -Return to clinic for follow-up with labs below in 3 months.   Orders Placed This Encounter  Procedures  . CBC with Differential    Standing Status:   Future    Standing Expiration Date:   05/04/2018  . Comprehensive metabolic panel    Standing Status:   Future    Standing Expiration Date:   05/04/2018    All questions were answered. The patient knows to call the clinic with any problems, questions or concerns. We can certainly see the patient much sooner if necessary. This note was electronically signed. Twana First, MD 05/04/2017

## 2017-06-26 ENCOUNTER — Other Ambulatory Visit: Payer: Self-pay | Admitting: Cardiology

## 2017-08-01 ENCOUNTER — Other Ambulatory Visit (HOSPITAL_COMMUNITY): Payer: Self-pay | Admitting: *Deleted

## 2017-08-01 DIAGNOSIS — C9110 Chronic lymphocytic leukemia of B-cell type not having achieved remission: Secondary | ICD-10-CM

## 2017-08-02 ENCOUNTER — Ambulatory Visit (HOSPITAL_COMMUNITY): Payer: Medicare HMO | Admitting: Internal Medicine

## 2017-08-02 ENCOUNTER — Other Ambulatory Visit (HOSPITAL_COMMUNITY): Payer: Medicare HMO

## 2017-08-18 ENCOUNTER — Inpatient Hospital Stay (HOSPITAL_COMMUNITY): Payer: Medicare HMO | Attending: Internal Medicine | Admitting: Internal Medicine

## 2017-08-18 ENCOUNTER — Encounter (HOSPITAL_COMMUNITY): Payer: Self-pay | Admitting: Internal Medicine

## 2017-08-18 ENCOUNTER — Inpatient Hospital Stay (HOSPITAL_COMMUNITY): Payer: Medicare HMO | Attending: Internal Medicine

## 2017-08-18 VITALS — BP 129/52 | HR 56 | Temp 97.6°F | Resp 16 | Wt 170.6 lb

## 2017-08-18 DIAGNOSIS — I10 Essential (primary) hypertension: Secondary | ICD-10-CM | POA: Diagnosis not present

## 2017-08-18 DIAGNOSIS — C9110 Chronic lymphocytic leukemia of B-cell type not having achieved remission: Secondary | ICD-10-CM | POA: Insufficient documentation

## 2017-08-18 LAB — COMPREHENSIVE METABOLIC PANEL
ALT: 13 U/L — ABNORMAL LOW (ref 17–63)
AST: 25 U/L (ref 15–41)
Albumin: 4 g/dL (ref 3.5–5.0)
Alkaline Phosphatase: 47 U/L (ref 38–126)
Anion gap: 9 (ref 5–15)
BUN: 18 mg/dL (ref 6–20)
CO2: 26 mmol/L (ref 22–32)
Calcium: 9.3 mg/dL (ref 8.9–10.3)
Chloride: 106 mmol/L (ref 101–111)
Creatinine, Ser: 1.43 mg/dL — ABNORMAL HIGH (ref 0.61–1.24)
GFR calc Af Amer: 55 mL/min — ABNORMAL LOW (ref >60)
GFR calc non Af Amer: 47 mL/min — ABNORMAL LOW (ref >60)
Glucose, Bld: 117 mg/dL — ABNORMAL HIGH (ref 65–99)
Potassium: 4.1 mmol/L (ref 3.5–5.1)
Sodium: 141 mmol/L (ref 135–145)
Total Bilirubin: 1.2 mg/dL (ref 0.3–1.2)
Total Protein: 6.8 g/dL (ref 6.5–8.1)

## 2017-08-18 LAB — CBC WITH DIFFERENTIAL/PLATELET
Basophils Absolute: 0 10*3/uL (ref 0.0–0.1)
Basophils Relative: 0 %
Eosinophils Absolute: 0 10*3/uL (ref 0.0–0.7)
Eosinophils Relative: 0 %
HCT: 45.9 % (ref 39.0–52.0)
Hemoglobin: 13.4 g/dL (ref 13.0–17.0)
Lymphocytes Relative: 93 %
Lymphs Abs: 42 10*3/uL — ABNORMAL HIGH (ref 0.7–4.0)
MCH: 28.8 pg (ref 26.0–34.0)
MCHC: 29.2 g/dL — ABNORMAL LOW (ref 30.0–36.0)
MCV: 98.5 fL (ref 78.0–100.0)
Monocytes Absolute: 0.5 10*3/uL (ref 0.1–1.0)
Monocytes Relative: 1 %
Neutro Abs: 2.7 10*3/uL (ref 1.7–7.7)
Neutrophils Relative %: 6 %
Platelets: 153 10*3/uL (ref 150–400)
RBC: 4.66 MIL/uL (ref 4.22–5.81)
RDW: 14.6 % (ref 11.5–15.5)
WBC: 45.2 10*3/uL — ABNORMAL HIGH (ref 4.0–10.5)

## 2017-08-18 LAB — LACTATE DEHYDROGENASE: LDH: 147 U/L (ref 98–192)

## 2017-08-18 NOTE — Patient Instructions (Signed)
[place] at [facility] Discharge Instructions   You were seen today by Dr. Zoila Shutter. She went over your recent lab results and everything looked good. Return in months 4 for labs and follow up.   Thank you for choosing [place] at [facility] to provide your oncology and hematology care.  To afford each patient quality time with our provider, please arrive at least 15 minutes before your scheduled appointment time.    If you have a lab appointment with the [facility] please come in thru the  Main Entrance and check in at the main information desk  You need to re-schedule your appointment should you arrive 10 or more minutes late.  We strive to give you quality time with our providers, and arriving late affects you and other patients whose appointments are after yours.  Also, if you no show three or more times for appointments you may be dismissed from the clinic at the providers discretion.     Again, thank you for choosing [facility].  Our hope is that these requests will decrease the amount of time that you wait before being seen by our physicians.       _____________________________________________________________  Should you have questions after your visit to [facility], please contact our office at [phone] between the hours of 8:30 a.m. and 4:30 p.m.  Voicemails left after 4:30 p.m. will not be returned until the following business day.  For prescription refill requests, have your pharmacy contact our office.       Resources For Cancer Patients and their Caregivers ? American Cancer Society: Can assist with transportation, wigs, general needs, runs Look Good Feel Better.        (973)061-6419 ? Cancer Care: Provides financial assistance, online support groups, medication/co-pay assistance.  1-800-813-HOPE 505-505-4106) ? [facility] Assists [place] cancer patients and  their families through emotional , educational and financial support.  920-352-6383 ? [place] DSS Where to apply for food stamps, Medicaid and utility assistance. 913-418-6423 ? [facility]: Transportation to medical appointments. 716 779 0124 ? Social Security Administration: May apply for disability if have a Stage IV cancer. (970)055-4379 (586)796-4270 ? [employer], Disability and Transit Services: Assists with nutrition, care and transit needs. [place] Support Programs:   > Cancer Support Group  2nd Tuesday of the month 1pm-2pm, Journey Room   > Creative Journey  3rd Tuesday of the month 1130am-1pm, Journey Room

## 2017-09-03 ENCOUNTER — Other Ambulatory Visit: Payer: Self-pay | Admitting: Cardiology

## 2017-09-04 NOTE — Telephone Encounter (Signed)
REFILL 

## 2017-09-06 ENCOUNTER — Encounter: Payer: Self-pay | Admitting: Internal Medicine

## 2017-09-07 ENCOUNTER — Other Ambulatory Visit: Payer: Self-pay

## 2017-09-07 ENCOUNTER — Emergency Department (HOSPITAL_COMMUNITY)
Admission: EM | Admit: 2017-09-07 | Discharge: 2017-09-07 | Disposition: A | Discharge status: Home / Self Care | Payer: Medicare HMO | Attending: Emergency Medicine | Admitting: Emergency Medicine

## 2017-09-07 ENCOUNTER — Emergency Department (HOSPITAL_COMMUNITY): Payer: Medicare HMO

## 2017-09-07 ENCOUNTER — Encounter (HOSPITAL_COMMUNITY): Payer: Self-pay | Admitting: *Deleted

## 2017-09-07 DIAGNOSIS — Z7901 Long term (current) use of anticoagulants: Secondary | ICD-10-CM | POA: Diagnosis not present

## 2017-09-07 DIAGNOSIS — I252 Old myocardial infarction: Secondary | ICD-10-CM | POA: Insufficient documentation

## 2017-09-07 DIAGNOSIS — Z856 Personal history of leukemia: Secondary | ICD-10-CM | POA: Diagnosis not present

## 2017-09-07 DIAGNOSIS — Z87891 Personal history of nicotine dependence: Secondary | ICD-10-CM | POA: Insufficient documentation

## 2017-09-07 DIAGNOSIS — Z955 Presence of coronary angioplasty implant and graft: Secondary | ICD-10-CM | POA: Insufficient documentation

## 2017-09-07 DIAGNOSIS — I10 Essential (primary) hypertension: Secondary | ICD-10-CM | POA: Diagnosis not present

## 2017-09-07 DIAGNOSIS — R197 Diarrhea, unspecified: Secondary | ICD-10-CM

## 2017-09-07 DIAGNOSIS — R109 Unspecified abdominal pain: Secondary | ICD-10-CM | POA: Diagnosis not present

## 2017-09-07 DIAGNOSIS — Z79899 Other long term (current) drug therapy: Secondary | ICD-10-CM | POA: Insufficient documentation

## 2017-09-07 DIAGNOSIS — I2510 Atherosclerotic heart disease of native coronary artery without angina pectoris: Secondary | ICD-10-CM | POA: Diagnosis not present

## 2017-09-07 DIAGNOSIS — R112 Nausea with vomiting, unspecified: Secondary | ICD-10-CM

## 2017-09-07 LAB — DIFFERENTIAL
Basophils Absolute: 0.1 10*3/uL (ref 0.0–0.1)
Basophils Relative: 0 %
Eosinophils Absolute: 0.2 10*3/uL (ref 0.0–0.7)
Eosinophils Relative: 0 %
Lymphocytes Relative: 89 %
Lymphs Abs: 54.5 10*3/uL (ref 0.7–4.0)
Monocytes Absolute: 0.8 10*3/uL (ref 0.1–1.0)
Monocytes Relative: 1 %
Neutro Abs: 6.4 10*3/uL (ref 1.7–7.7)
Neutrophils Relative %: 10 %

## 2017-09-07 LAB — COMPREHENSIVE METABOLIC PANEL
ALT: 20 U/L (ref 17–63)
AST: 29 U/L (ref 15–41)
Albumin: 4.5 g/dL (ref 3.5–5.0)
Alkaline Phosphatase: 53 U/L (ref 38–126)
Anion gap: 10 (ref 5–15)
BUN: 21 mg/dL — ABNORMAL HIGH (ref 6–20)
CO2: 21 mmol/L — ABNORMAL LOW (ref 22–32)
Calcium: 10 mg/dL (ref 8.9–10.3)
Chloride: 107 mmol/L (ref 101–111)
Creatinine, Ser: 1.58 mg/dL — ABNORMAL HIGH (ref 0.61–1.24)
GFR calc Af Amer: 48 mL/min — ABNORMAL LOW (ref >60)
GFR calc non Af Amer: 42 mL/min — ABNORMAL LOW (ref >60)
Glucose, Bld: 115 mg/dL — ABNORMAL HIGH (ref 65–99)
Potassium: 4.8 mmol/L (ref 3.5–5.1)
Sodium: 138 mmol/L (ref 135–145)
Total Bilirubin: 1.5 mg/dL — ABNORMAL HIGH (ref 0.3–1.2)
Total Protein: 8 g/dL (ref 6.5–8.1)

## 2017-09-07 LAB — URINALYSIS, ROUTINE W REFLEX MICROSCOPIC
Bacteria, UA: NONE SEEN
Bilirubin Urine: NEGATIVE
Glucose, UA: NEGATIVE mg/dL
Hgb urine dipstick: NEGATIVE
Ketones, ur: 5 mg/dL — AB
Leukocytes, UA: NEGATIVE
Nitrite: NEGATIVE
Protein, ur: 30 mg/dL — AB
Specific Gravity, Urine: 1.026 (ref 1.005–1.030)
Squamous Epithelial / HPF: NONE SEEN
pH: 5 (ref 5.0–8.0)

## 2017-09-07 LAB — CBC
HCT: 50.3 % (ref 39.0–52.0)
Hemoglobin: 15.3 g/dL (ref 13.0–17.0)
MCH: 29.5 pg (ref 26.0–34.0)
MCHC: 30.4 g/dL (ref 30.0–36.0)
MCV: 97.1 fL (ref 78.0–100.0)
Platelets: 284 10*3/uL (ref 150–400)
RBC: 5.18 MIL/uL (ref 4.22–5.81)
RDW: 14.5 % (ref 11.5–15.5)
WBC: 62.6 10*3/uL (ref 4.0–10.5)

## 2017-09-07 LAB — LIPASE, BLOOD: Lipase: 38 U/L (ref 11–51)

## 2017-09-07 LAB — TROPONIN I: Troponin I: <0.03 ng/mL (ref <0.03)

## 2017-09-07 MED ORDER — SODIUM CHLORIDE 0.9 % IV SOLN
INTRAVENOUS | Status: DC
  Administered 2017-09-07: 20:00:00 via INTRAVENOUS

## 2017-09-07 MED ORDER — ONDANSETRON 4 MG PO TBDP: 4.0000 mg | ORAL_TABLET | Freq: Three times a day (TID) | ORAL | 0 refills | Status: DC | PRN

## 2017-09-07 NOTE — ED Provider Notes (Signed)
[facility] EMERGENCY DEPARTMENT Provider Note   CSN: [id number] Arrival date & time: 09/07/17  1722     History   Chief Complaint Chief Complaint  Patient presents with  . Emesis    HPI [name] is a 72 y.o. male.  HPI Pt was seen at [place].  Per pt, c/o gradual onset and persistence of multiple intermittent episodes of N/V/D that began this morning. Has been associated with right sided abd "pains." Describes the stools as "watery." Describes the abd pain as "heaviness." Denies CP/SOB, no back pain, no fevers, no black or blood in stools or emesis.     Past Medical History:  Diagnosis Date  . Acute MI, inferolateral wall, initial episode of care ([place]) 06/16/15   99% dCx   . BPH (benign prostatic hyperplasia) 04/08/2011  . CAD S/P percutaneous coronary angioplasty 11/23/12; 06/16/15   a. 2 overlapping mRCA lesions - Xience Xpedition DES 2.75 mm x 18 mm (3.0 mm);; b. Inf-Lat STEMI 06/16/2015 - dCx Asp Thrombectomy & PCI 3.0 x 15 Xience drDES (3.6 mm)  . Chronic headache   . CLL (chronic lymphocytic leukemia) ([place]) 04/08/2011  . Depression   . Diverticulitis   . Gallstone   . GERD (gastroesophageal reflux disease)   . Hypercholesteremia   . Ischemic cardiomyopathy    Echo 06/18/2015 EF 35-40% after lateral MI -> repeat echo 08/06/2015: EF 40-45% with basal-mid inferolateral akinesis and hypokinesis of the lateral wall.  . NSTEMI (non-ST elevated myocardial infarction) ([place]) 11/23/12   NSTEMI 11/23/2012 RCA lesion - PCI with DES; moderate LAD disease  . PTSD (post-traumatic stress disorder)    With Depression - since MI    Patient Active Problem List   Diagnosis Date Noted  . GERD (gastroesophageal reflux disease) 03/08/2016  . Esophageal dysphagia 03/08/2016  . Chronic cough 03/08/2016  . Ischemic cardiomyopathy   . STEMI - 06/16/15 06/16/2015  . Benign essential HTN 06/16/2015  . Myocardial infarction of inferolateral wall ([place]) 06/16/2015  . Cholelithiasis without  cholecystitis 12/18/2013  . Hematochezia 06/04/2013  . Lower leg edema 03/14/2013  . PTSD (post-traumatic stress disorder)   . CAD S/P PCI-- RCA DES 2014,  CFX DES Jan 2017 11/24/2012  . NSTEMI (non-ST elevated myocardial infarction) - history of 11/23/2012  . Hyperglycemia 11/23/2012  . History of smoking 11/23/2012  . Dyslipidemia, goal LDL below 70 - on simvastatin; monitored by PCP 11/23/2012  . Diverticulitis of colon without hemorrhage- on antibiotics for recent flair up 09/19/2012  . Hx of adenomatous colonic polyps 09/19/2012  . CLL (chronic lymphocytic leukemia) ([place]) 04/08/2011  . BPH (benign prostatic hyperplasia) 04/08/2011    Past Surgical History:  Procedure Laterality Date  . APPENDECTOMY  1968-70  . CARDIAC CATHETERIZATION N/A 06/16/2015   Procedure: Left Heart Cath and Coronary Angiography;  Surgeon: Jettie Booze, MD;  Location: [facility];  Service: Cardiovascular: 99% thrombotic dCx -> asp thrombectomy & PCI. RCA Stents Patent.   Marland Kitchen CARDIAC CATHETERIZATION N/A 06/16/2015   Procedure: Coronary Stent Intervention;  Surgeon: Jettie Booze, MD;  Location: [facility];  Service: Cardiovascular: PCI dCx = thrombectomy -> 3.0 x 15 Xience drug-eluting stent (3.6 mm)   . COLONOSCOPY  2008   PTW:[facility]-LEXNT diverticula, diminutive polyp in the cecum, s/p bx Normal rectum. Tubular adenoma  . COLONOSCOPY N/A 10/17/2012   Dr. Gala Romney: colonic diverticulosis, tubular adenoma, surveillance due May 2019  . CORONARY STENT PLACEMENT  11/23/12   Mid RCA -- Xience eXp -  2.75 mm x 18 mm DES (3.41mm) , Dr. Ellyn Hack  . LEFT HEART CATHETERIZATION WITH CORONARY ANGIOGRAM N/A 11/23/2012   Procedure: LEFT HEART CATHETERIZATION WITH CORONARY ANGIOGRAM;  Surgeon: Leonie Man, MD;  Location: [facility] CATH LAB;  Service: Cardiovascular: NSTEMI: Culprit = mid RCA 95% & 75%; LAD ~40-50%, Small RI ~60%; EF ~60%, mild inf-basal HK     . TRANSTHORACIC ECHOCARDIOGRAM  January 2017; March  2017   a. EF 35-40%. Entire inferior hypokinesis and akinesis of the basal and mid inferolateral and anterolateral /distal lateral walls;; b. EF 40 and 45%. Akinesis of basal-mid inferolateral wall. Hypokinesis of entire lateral wall.        Home Medications    Prior to Admission medications   Medication Sig Start Date End Date Taking? Authorizing Provider  Cholecalciferol (D3-1000) 1000 UNITS capsule Take 1,000 Units by mouth 2 (two) times a week.      [provider]  clopidogrel (PLAVIX) 75 MG tablet TAKE 1 TABLET BY MOUTH DAILY. 11/16/16   Leonie Man, MD  Cyanocobalamin (VITAMIN B-12) 2500 MCG SUBL Place 1 tablet under the tongue once a week. Reported on 06/25/2015    [provider]  ferrous sulfate 325 (65 FE) MG tablet Take 325 mg by mouth 3 (three) times a week.     [provider]  FOLIC ACID PO Take 1 tablet by mouth 3 (three) times a week.    [provider]  hydrochlorothiazide (HYDRODIURIL) 12.5 MG tablet Take 12.5 mg by mouth as needed. 03/26/16   [provider]  L-Arginine 500 MG CAPS Take 1 capsule by mouth every 30 (thirty) days. Reported on 06/25/2015    [provider]  lisinopril (PRINIVIL,ZESTRIL) 2.5 MG tablet Take 1 tablet (2.5 mg total) by mouth daily. 06/18/15   Almyra Deforest, PA  metoprolol succinate (TOPROL-XL) 25 MG 24 hr tablet TAKE 1/2 TABLET BY MOUTH DAILY. 06/26/17   Leonie Man, MD  Multiple Vitamins-Minerals (ZINC PO) Take 1 tablet by mouth every 30 (thirty) days.    [provider]  nitroGLYCERIN (NITROSTAT) 0.4 MG SL tablet Place 1 tablet (0.4 mg total) under the tongue every 5 (five) minutes as needed for chest pain. 09/30/15   Leonie Man, MD  OVER THE COUNTER MEDICATION Take 1 tablet by mouth 3 (three) times a week. Reported on 06/25/2015    [provider]  pantoprazole (PROTONIX) 40 MG tablet TAKE 1 TABLET BY MOUTH TWICE A DAY 04/25/17   Annitta Needs, NP  pyridoxine (B-6)  100 MG tablet Take 100 mg by mouth 3 (three) times a week. Reported on 06/25/2015    [provider]  simvastatin (ZOCOR) 40 MG tablet TAKE 1 TABLET (40 MG TOTAL) BY MOUTH EVERY OTHER DAY. 09/04/17   Leonie Man, MD  VITAMIN A PO Take 1 capsule by mouth 3 (three) times a week.    [provider]  vitamin C (ASCORBIC ACID) 500 MG tablet Take 500 mg by mouth 3 (three) times a week.     [provider]    Family History Family History  Problem Relation Age of Onset  . Diabetes Mother   . Cancer Father   . Colon cancer Neg Hx     Social History Social History   Tobacco Use  . Smoking status: Former Smoker    Packs/day: 0.25    Years: 2.00    Pack years: 0.50    Types: Cigarettes    Last attempt  to quit: 06/07/1963    Years since quitting: 54.2  . Smokeless tobacco: Never Used  Substance Use Topics  . Alcohol use: No    Alcohol/week: 0.0 oz    Comment: Occasionally drinks red wine  . Drug use: No     Allergies   Patient has no known allergies.   Review of Systems Review of Systems ROS: Statement: All systems negative except as marked or noted in the HPI; Constitutional: Negative for fever and chills. ; ; Eyes: Negative for eye pain, redness and discharge. ; ; ENMT: Negative for ear pain, hoarseness, nasal congestion, sinus pressure and sore throat. ; ; Cardiovascular: Negative for chest pain, palpitations, diaphoresis, dyspnea and peripheral edema. ; ; Respiratory: Negative for cough, wheezing and stridor. ; ; Gastrointestinal: +N/V/D, abd pain. Negative for blood in stool, hematemesis, jaundice and rectal bleeding. . ; ; Genitourinary: Negative for dysuria, flank pain and hematuria. ; ; Musculoskeletal: Negative for back pain and neck pain. Negative for swelling and trauma.; ; Skin: Negative for pruritus, rash, abrasions, blisters, bruising and skin lesion.; ; Neuro: Negative for headache, lightheadedness and neck stiffness. Negative for weakness,  altered level of consciousness, altered mental status, extremity weakness, paresthesias, involuntary movement, seizure and syncope.       Physical Exam Updated Vital Signs BP 127/77 (BP Location: Right Arm)   Pulse 96   Temp 97.7 F (36.5 C) (Oral)   Resp 16   Ht 5\' 9"  (1.753 m)   Wt 74.8 kg (165 lb)   SpO2 97%   BMI 24.37 kg/m     20:48:10 Orthostatic Vital Signs KB  Orthostatic Lying   BP- Lying: 127/76   Pulse- Lying: 73       Orthostatic Sitting  BP- Sitting: 126/78   Pulse- Sitting: 78       Orthostatic Standing at 0 minutes  BP- Standing at 0 minutes: 121/83   Pulse- Standing at 0 minutes: 92       Orthostatic Standing at 3 minutes  BP- Standing at 3 minutes: 125/88   Pulse- Standing at 3 minutes: 102      Physical Exam 1845: Physical examination:  Nursing notes reviewed; Vital signs and O2 SAT reviewed;  Constitutional: Well developed, Well nourished, In no acute distress; Head:  Normocephalic, atraumatic; Eyes: EOMI, PERRL, No scleral icterus; ENMT: Mouth and pharynx normal, Mucous membranes dry; Neck: Supple, Full range of motion, No lymphadenopathy; Cardiovascular: Regular rate and rhythm, No gallop; Respiratory: Breath sounds clear & equal bilaterally, No wheezes.  Speaking full sentences with ease, Normal respiratory effort/excursion; Chest: Nontender, Movement normal; Abdomen: Soft, +right sided abd tenderness to palp. No rebound or guarding. Nondistended, Normal bowel sounds; Genitourinary: No CVA tenderness; Extremities: Peripheral pulses normal, No tenderness, No edema, No calf edema or asymmetry.; Neuro: AA&Ox3, Major CN grossly intact.  Speech clear. No gross focal motor or sensory deficits in extremities.; Skin: Color normal, Warm, Dry.   ED Treatments / Results  Labs (all labs ordered are listed, but only abnormal results are displayed)   EKG EKG Interpretation  Date/Time:  Thursday September 07 2017 19:12:00 EDT Ventricular Rate:  79 PR  Interval:    QRS Duration: 83 QT Interval:  380 QTC Calculation: 436 R Axis:   -6 Text Interpretation:  Sinus rhythm Inferior infarct, old Baseline wander When compared with ECG of 10/24/2016 Rate faster Otherwise no significant change Reconfirmed by Francine Graven [phone]) on 09/07/2017 7:25:11 PM   Radiology   Procedures Procedures (including critical care time)  Medications Ordered in ED Medications  0.9 %  sodium chloride infusion (has no administration in time range)     Initial Impression / Assessment and Plan / ED Course  I have reviewed the triage vital signs and the nursing notes.  Pertinent labs & imaging results that were available during my care of the patient were reviewed by me and considered in my medical decision making (see chart for details).  MDM Reviewed: previous chart, nursing note and vitals Reviewed previous: labs and ECG Interpretation: labs, ECG, x-ray and CT scan   Results for orders placed or performed during the hospital encounter of 09/07/17  Lipase, blood  Result Value Ref Range   Lipase 38 11 - 51 U/L  Comprehensive metabolic panel  Result Value Ref Range   Sodium 138 135 - 145 mmol/L   Potassium 4.8 3.5 - 5.1 mmol/L   Chloride 107 101 - 111 mmol/L   CO2 21 (L) 22 - 32 mmol/L   Glucose, Bld 115 (H) 65 - 99 mg/dL   BUN 21 (H) 6 - 20 mg/dL   Creatinine, Ser 1.58 (H) 0.61 - 1.24 mg/dL   Calcium 10.0 8.9 - 10.3 mg/dL   Total Protein 8.0 6.5 - 8.1 g/dL   Albumin 4.5 3.5 - 5.0 g/dL   AST 29 15 - 41 U/L   ALT 20 17 - 63 U/L   Alkaline Phosphatase 53 38 - 126 U/L   Total Bilirubin 1.5 (H) 0.3 - 1.2 mg/dL   GFR calc non Af Amer 42 (L) >60 mL/min   GFR calc Af Amer 48 (L) >60 mL/min   Anion gap 10 5 - 15  CBC  Result Value Ref Range   WBC 62.6 (HH) 4.0 - 10.5 K/uL   RBC 5.18 4.22 - 5.81 MIL/uL   Hemoglobin 15.3 13.0 - 17.0 g/dL   HCT 50.3 39.0 - 52.0 %   MCV 97.1 78.0 - 100.0 fL   MCH 29.5 26.0 - 34.0 pg   MCHC 30.4 30.0 - 36.0 g/dL     RDW 14.5 11.5 - 15.5 %   Platelets 284 150 - 400 K/uL  Differential  Result Value Ref Range   Neutrophils Relative % 10 %   Neutro Abs 6.4 1.7 - 7.7 K/uL   Lymphocytes Relative 89 %   Lymphs Abs 54.5 0.7 - 4.0 K/uL   Monocytes Relative 1 %   Monocytes Absolute 0.8 0.1 - 1.0 K/uL   Eosinophils Relative 0 %   Eosinophils Absolute 0.2 0.0 - 0.7 K/uL   Basophils Relative 0 %   Basophils Absolute 0.1 0.0 - 0.1 K/uL   WBC Morphology WHITE COUNT CONFIRMED ON SMEAR   Troponin I  Result Value Ref Range   Troponin I <0.03 <0.03 ng/mL   Ct Abdomen Pelvis Wo Contrast Result Date: 09/07/2017 CLINICAL DATA:  Abdominal pain, diarrhea, vomiting EXAM: CT ABDOMEN AND PELVIS WITHOUT CONTRAST TECHNIQUE: Multidetector CT imaging of the abdomen and pelvis was performed following the standard protocol without IV contrast. COMPARISON:  03/12/2014 FINDINGS: Lower chest: No acute abnormality. Hepatobiliary: No focal liver abnormality is seen. Cholelithiasis without gallbladder wall thickening or surrounding inflammatory changes. No biliary dilatation. Pancreas: Unremarkable. No pancreatic ductal dilatation or surrounding inflammatory changes. Spleen: Normal in size without focal abnormality. Adrenals/Urinary Tract: Adrenal glands are unremarkable. Kidneys are normal, without renal calculi, focal lesion, or hydronephrosis. Bladder is unremarkable. Stomach/Bowel: Stomach is within normal limits. No evidence of bowel wall thickening, distention, or inflammatory changes. Fluid-filled colon as can  be seen with diarrhea. Diverticulosis of the sigmoid colon without evidence of diverticulitis. Vascular/Lymphatic: Normal caliber abdominal aorta with atherosclerosis. Multiple mildly enlarged para-aortic lymph nodes largest measuring 12 mm in short axis. Reproductive: Prostate gland is enlarged. Other: No fluid collection or hematoma.  No ascites. Musculoskeletal: No acute osseous abnormality. No aggressive osseous lesion.  Degenerative disc disease with disc height loss at L5-S1 with facet arthropathy. IMPRESSION: 1. Fluid-filled colon as can be seen with diarrhea. 2. Diverticulosis without evidence of diverticulitis. 3. Cholelithiasis. 4. Enlarged prostate gland. 5. Mild retroperitoneal para-aortic lymphadenopathy. Interval improvement compared with 03/12/2014. Electronically Signed   By: Kathreen Devoid   On: 09/07/2017 20:21   Dg Chest 2 View Result Date: 09/07/2017 CLINICAL DATA:  Nausea vomiting and diarrhea EXAM: CHEST - 2 VIEW COMPARISON:  03/08/2016 FINDINGS: The heart size and mediastinal contours are within normal limits. Both lungs are clear. The visualized skeletal structures are unremarkable. IMPRESSION: No active cardiopulmonary disease. Electronically Signed   By: Donavan Foil M.D.   On: 09/07/2017 19:54     2155:   Udip pending.  WBC count appears to be near baseline (hx CLL); will need Heme/Onc MD consult regarding same after Udip results.  Not orthostatic no VS. Pt has tol PO well while in the ED without N/V.  No stooling while in the ED.  Abd benign, VSS. Sign out to EDP Dr. Laverta Baltimore.     Final Clinical Impressions(s) / ED Diagnoses   Final diagnoses:  None    ED Discharge Orders    None       Francine Graven, DO 09/11/17 [id number]

## 2017-09-07 NOTE — ED Triage Notes (Signed)
Pt c/o diarrhea, vomiting, right sided abdominal pain that started today.

## 2017-09-07 NOTE — ED Notes (Signed)
EDP at bedside  

## 2017-09-07 NOTE — Discharge Instructions (Signed)
Take the prescriptions as directed.  Increase your fluid intake (ie:  Gatoraide) for the next few days.  Eat a bland diet and advance to your regular diet slowly as you can tolerate it.   Avoid full strength juices, as well as milk and milk products until your diarrhea has resolved.   Call your regular medical doctor tomorrow to schedule a follow up appointment in the next 2 days. Call your Oncologist tomorrow to schedule a follow up appointment on Monday to recheck your labs.  Return to the Emergency Department immediately if not improving (or even worsening) despite taking the medicines as prescribed, any black or bloody stool or vomit, if you develop a fever over "101," or for any other concerns.

## 2017-09-07 NOTE — ED Notes (Signed)
Date and time results received: 09/07/17 1854  Test: WBC Critical Value: 62.6  Name of Provider Notified: Dr. Thurnell Garbe

## 2017-09-07 NOTE — ED Provider Notes (Signed)
Blood pressure (!) 156/88, pulse (!) 104, temperature 97.7 F (36.5 C), temperature source Oral, resp. rate (!) 27, height 5\' 9"  (1.753 m), weight 74.8 kg (165 lb), SpO2 92 %.  Assuming care from Dr. Thurnell Garbe.  In short, [name] is a 72 y.o. male with a chief complaint of Emesis .  Refer to the original H&P for additional details.  The current plan of care is to follow up on UA and speaking with Oncology on call.  10:22 PM Patient is tolerating PO without difficulty. Spoke with Oncology Dr. Robin Searing who recommends calling the clinic for an appointment next week for repeat lab work.   At this time, I do not feel there is any life-threatening condition present. I have reviewed and discussed all results (EKG, imaging, lab, urine as appropriate), exam findings with patient. I have reviewed nursing notes and appropriate previous records.  I feel the patient is safe to be discharged home without further emergent workup. Discussed usual and customary return precautions. Patient and family (if present) verbalize understanding and are comfortable with this plan.  Patient will follow-up with their primary care provider. If they do not have a primary care provider, information for follow-up has been provided to them. All questions have been answered.  Nanda Quinton, MD    Margette Fast, MD 09/08/17 [phone]

## 2017-09-08 ENCOUNTER — Other Ambulatory Visit (HOSPITAL_COMMUNITY): Payer: Self-pay

## 2017-09-08 DIAGNOSIS — C9110 Chronic lymphocytic leukemia of B-cell type not having achieved remission: Secondary | ICD-10-CM

## 2017-09-08 NOTE — Progress Notes (Signed)
Patient called stating he was seen in the ER last evening for N/V. He states he was told to call the cancer center today. Reviewed chart and reviewed with provider. Dr. Raliegh Ip wants patient to have labs checked early next week. Labs entered, lab appt made for Monday, and orders linked.

## 2017-09-11 ENCOUNTER — Inpatient Hospital Stay (HOSPITAL_COMMUNITY): Payer: Medicare HMO | Attending: Internal Medicine

## 2017-09-11 DIAGNOSIS — C9110 Chronic lymphocytic leukemia of B-cell type not having achieved remission: Secondary | ICD-10-CM | POA: Diagnosis not present

## 2017-09-11 LAB — CBC WITH DIFFERENTIAL/PLATELET
Basophils Absolute: 0 10*3/uL (ref 0.0–0.1)
Basophils Relative: 0 %
Eosinophils Absolute: 0.1 10*3/uL (ref 0.0–0.7)
Eosinophils Relative: 0 %
HCT: 46 % (ref 39.0–52.0)
Hemoglobin: 14.3 g/dL (ref 13.0–17.0)
Lymphocytes Relative: 88 %
Lymphs Abs: 47.3 10*3/uL (ref 0.7–4.0)
MCH: 29.5 pg (ref 26.0–34.0)
MCHC: 31.1 g/dL (ref 30.0–36.0)
MCV: 94.8 fL (ref 78.0–100.0)
Monocytes Absolute: 0.6 10*3/uL (ref 0.1–1.0)
Monocytes Relative: 1 %
Neutro Abs: 5.8 10*3/uL (ref 1.7–7.7)
Neutrophils Relative %: 11 %
Platelets: 204 10*3/uL (ref 150–400)
RBC: 4.85 MIL/uL (ref 4.22–5.81)
RDW: 14.9 % (ref 11.5–15.5)
WBC: 53.8 10*3/uL (ref 4.0–10.5)

## 2017-09-11 LAB — COMPREHENSIVE METABOLIC PANEL
ALT: 17 U/L (ref 17–63)
AST: 34 U/L (ref 15–41)
Albumin: 3.9 g/dL (ref 3.5–5.0)
Alkaline Phosphatase: 46 U/L (ref 38–126)
Anion gap: 10 (ref 5–15)
BUN: 33 mg/dL — ABNORMAL HIGH (ref 6–20)
CO2: 23 mmol/L (ref 22–32)
Calcium: 9 mg/dL (ref 8.9–10.3)
Chloride: 103 mmol/L (ref 101–111)
Creatinine, Ser: 1.58 mg/dL — ABNORMAL HIGH (ref 0.61–1.24)
GFR calc Af Amer: 48 mL/min — ABNORMAL LOW (ref >60)
GFR calc non Af Amer: 42 mL/min — ABNORMAL LOW (ref >60)
Glucose, Bld: 110 mg/dL — ABNORMAL HIGH (ref 65–99)
Potassium: 3.7 mmol/L (ref 3.5–5.1)
Sodium: 136 mmol/L (ref 135–145)
Total Bilirubin: 1.4 mg/dL — ABNORMAL HIGH (ref 0.3–1.2)
Total Protein: 7.1 g/dL (ref 6.5–8.1)

## 2017-09-11 LAB — LACTATE DEHYDROGENASE: LDH: 151 U/L (ref 98–192)

## 2017-09-11 NOTE — Progress Notes (Unsigned)
CRITICAL VALUE ALERT Critical value received:  WBC 53.7 Date of notification:  09/11/17 Time of notification: 4920 Critical value read back:  Yes.   Nurse who received alert:  Isidoro Donning RN Dr. Raliegh Ip notified. WBC count is lower than last blood draw.

## 2017-09-18 NOTE — Progress Notes (Signed)
Diagnosis CLL (chronic lymphocytic leukemia) ([place]) - Plan: CBC with Differential/Platelet, Comprehensive metabolic panel, Lactate dehydrogenase  Staging Cancer Staging No matching staging information was found for the patient.  Assessment and Plan:  1.   CLL.  Pt was previously followed by Dr. Talbert Cage.  Labs done 08/18/2017 WBC 45.2 HB 13.4 plts 153,000.  Cr 1, LDH 147.  He will RTC 12/2017 for follow-up and repeat labs.  He is asymptomatic and will remain on observation.    2.  Hypertension.  BP 129/52.  Follow-up with PCP.    3.  Health maintenance.  GI follow-up as recommended.    Current Status:  72 y.o. Male with CLL.  Patient seen for follow-up and repeat labs.  He denies any B symptoms including drenching night sweats, unexplained weight loss, fevers or chills.  He has not had any recent infections.  He has not noted any new lymphadenopathy.    Problem List Patient Active Problem List   Diagnosis Date Noted  . GERD (gastroesophageal reflux disease) [K21.9] 03/08/2016  . Esophageal dysphagia [R13.10] 03/08/2016  . Chronic cough [R05] 03/08/2016  . Ischemic cardiomyopathy [I25.5]   . STEMI - 06/16/15 [I21.3] 06/16/2015  . Benign essential HTN [I10] 06/16/2015  . Myocardial infarction of inferolateral wall ([place]) [I21.19] 06/16/2015  . Cholelithiasis without cholecystitis [K80.20] 12/18/2013  . Hematochezia [K92.1] 06/04/2013  . Lower leg edema [R60.0] 03/14/2013  . PTSD (post-traumatic stress disorder) [F43.10]   . CAD S/P PCI-- RCA DES 2014,  CFX DES Jan 2017 [I25.10, Z98.61] 11/24/2012  . NSTEMI (non-ST elevated myocardial infarction) - history of [I21.4] 11/23/2012  . Hyperglycemia [R73.9] 11/23/2012  . History of smoking [Z87.891] 11/23/2012  . Dyslipidemia, goal LDL below 70 - on simvastatin; monitored by PCP [E78.5] 11/23/2012  . Diverticulitis of colon without hemorrhage- on antibiotics for recent flair up [K57.32] 09/19/2012  . Hx of adenomatous colonic polyps [Z86.010]  09/19/2012  . CLL (chronic lymphocytic leukemia) ([place]) [C91.90] 04/08/2011  . BPH (benign prostatic hyperplasia) [N40.0] 04/08/2011    Past Medical History Past Medical History:  Diagnosis Date  . Acute MI, inferolateral wall, initial episode of care ([place]) 06/16/15   99% dCx   . BPH (benign prostatic hyperplasia) 04/08/2011  . CAD S/P percutaneous coronary angioplasty 11/23/12; 06/16/15   a. 2 overlapping mRCA lesions - Xience Xpedition DES 2.75 mm x 18 mm (3.0 mm);; b. Inf-Lat STEMI 06/16/2015 - dCx Asp Thrombectomy & PCI 3.0 x 15 Xience drDES (3.6 mm)  . Chronic headache   . CLL (chronic lymphocytic leukemia) ([place]) 04/08/2011  . Depression   . Diverticulitis   . Gallstone   . GERD (gastroesophageal reflux disease)   . Hypercholesteremia   . Ischemic cardiomyopathy    Echo 06/18/2015 EF 35-40% after lateral MI -> repeat echo 08/06/2015: EF 40-45% with basal-mid inferolateral akinesis and hypokinesis of the lateral wall.  . NSTEMI (non-ST elevated myocardial infarction) ([place]) 11/23/12   NSTEMI 11/23/2012 RCA lesion - PCI with DES; moderate LAD disease  . PTSD (post-traumatic stress disorder)    With Depression - since MI    Past Surgical History Past Surgical History:  Procedure Laterality Date  . APPENDECTOMY  1968-70  . CARDIAC CATHETERIZATION N/A 06/16/2015   Procedure: Left Heart Cath and Coronary Angiography;  Surgeon: Jettie Booze, MD;  Location: [facility];  Service: Cardiovascular: 99% thrombotic dCx -> asp thrombectomy & PCI. RCA Stents Patent.   Marland Kitchen CARDIAC CATHETERIZATION N/A 06/16/2015   Procedure: Coronary Stent Intervention;  Surgeon:  Jettie Booze, MD;  Location: [place] [facility];  Service: Cardiovascular: PCI dCx = thrombectomy -> 3.0 x 15 Xience drug-eluting stent (3.6 mm)   . COLONOSCOPY  2008   PTW:[facility] diverticula, diminutive polyp in the cecum, s/p bx Normal rectum. Tubular adenoma  . COLONOSCOPY N/A 10/17/2012   Dr. Gala Romney: colonic  diverticulosis, tubular adenoma, surveillance due May 2019  . CORONARY STENT PLACEMENT  11/23/12   Mid RCA -- Xience eXp - 2.75 mm x 18 mm DES (3.74m) , Dr. HEllyn Hack . LEFT HEART CATHETERIZATION WITH CORONARY ANGIOGRAM N/A 11/23/2012   Procedure: LEFT HEART CATHETERIZATION WITH CORONARY ANGIOGRAM;  Surgeon: DLeonie Man MD;  [facility];  Service: Cardiovascular: NSTEMI: Culprit = mid RCA 95% & 75%; LAD ~40-50%, Small RI ~60%; EF ~60%, mild inf-basal HK     . TRANSTHORACIC ECHOCARDIOGRAM  January 2017; March 2017   a. EF 35-40%. Entire inferior hypokinesis and akinesis of the basal and mid inferolateral and anterolateral /distal lateral walls;; b. EF 40 and 45%. Akinesis of basal-mid inferolateral wall. Hypokinesis of entire lateral wall.    Family History Family History  Problem Relation Age of Onset  . Diabetes Mother   . Cancer Father   . Colon cancer Neg Hx      Social History  reports that he quit smoking about 54 years ago. His smoking use included cigarettes. He has a 0.50 pack-year smoking history. He has never used smokeless tobacco. He reports that he does not drink alcohol or use drugs.  Medications  Current Outpatient Medications:  .  Cholecalciferol (D3-1000) 1000 UNITS capsule, Take 1,000 Units by mouth 2 (two) times a week.  , Disp: , Rfl:  .  clopidogrel (PLAVIX) 75 MG tablet, TAKE 1 TABLET BY MOUTH DAILY., Disp: 90 tablet, Rfl: 3 .  Cyanocobalamin (VITAMIN B-12) 2500 MCG SUBL, Place 1 tablet under the tongue once a week. Reported on 06/25/2015, Disp: , Rfl:  .  ferrous sulfate 325 (65 FE) MG tablet, Take 325 mg by mouth 3 (three) times a week. , Disp: , Rfl:  .  FOLIC ACID PO, Take 1 tablet by mouth 3 (three) times a week., Disp: , Rfl:  .  hydrochlorothiazide (HYDRODIURIL) 12.5 MG tablet, Take 12.5 mg by mouth as needed., Disp: , Rfl:  .  L-Arginine 500 MG CAPS, Take 1 capsule by mouth every 30 (thirty) days. Reported on 06/25/2015, Disp: , Rfl:  .   lisinopril (PRINIVIL,ZESTRIL) 2.5 MG tablet, Take 1 tablet (2.5 mg total) by mouth daily., Disp: 90 tablet, Rfl: 3 .  metoprolol succinate (TOPROL-XL) 25 MG 24 hr tablet, TAKE 1/2 TABLET BY MOUTH DAILY., Disp: 45 tablet, Rfl: 0 .  Multiple Vitamins-Minerals (ZINC PO), Take 1 tablet by mouth every 30 (thirty) days., Disp: , Rfl:  .  nitroGLYCERIN (NITROSTAT) 0.4 MG SL tablet, Place 1 tablet (0.4 mg total) under the tongue every 5 (five) minutes as needed for chest pain., Disp: 25 tablet, Rfl: 3 .  OVER THE COUNTER MEDICATION, Take 1 tablet by mouth 3 (three) times a week. Reported on 06/25/2015, Disp: , Rfl:  .  pantoprazole (PROTONIX) 40 MG tablet, TAKE 1 TABLET BY MOUTH TWICE A DAY, Disp: 60 tablet, Rfl: 5 .  pyridoxine (B-6) 100 MG tablet, Take 100 mg by mouth 3 (three) times a week. Reported on 06/25/2015, Disp: , Rfl:  .  VITAMIN A PO, Take 1 capsule by mouth 3 (three) times a week., Disp: , Rfl:  .  vitamin C (ASCORBIC ACID) 500 MG tablet, Take 500 mg by mouth 3 (three) times a week. , Disp: , Rfl:  .  azithromycin (ZITHROMAX) 250 MG tablet, Take 1 tablet by mouth daily., Disp: , Rfl: 0 .  ondansetron (ZOFRAN ODT) 4 MG disintegrating tablet, Take 1 tablet (4 mg total) by mouth every 8 (eight) hours as needed for nausea or vomiting., Disp: 6 tablet, Rfl: 0 .  simvastatin (ZOCOR) 40 MG tablet, TAKE 1 TABLET (40 MG TOTAL) BY MOUTH EVERY OTHER DAY., Disp: 15 tablet, Rfl: 11  Allergies Patient has no known allergies.  Review of Systems Review of Systems - Oncology ROS as per HPI otherwise 12 point ROS is negative.   Physical Exam  Vitals Wt Readings from Last 3 Encounters:  09/07/17 165 lb (74.8 kg)  08/18/17 170 lb 9.6 oz (77.4 kg)  05/04/17 170 lb 4.8 oz (77.2 kg)   Temp Readings from Last 3 Encounters:  09/07/17 97.7 F (36.5 C) (Oral)  08/18/17 97.6 F (36.4 C) (Oral)  05/04/17 98.4 F (36.9 C) (Oral)   BP Readings from Last 3 Encounters:  09/07/17 (!) 156/88  08/18/17 (!)  129/52  05/04/17 138/62   Pulse Readings from Last 3 Encounters:  09/07/17 (!) 104  08/18/17 (!) 56  05/04/17 60   Constitutional: Well-developed, well-nourished, and in no distress.   HENT: Head: Normocephalic and atraumatic.  Mouth/Throat: No oropharyngeal exudate. Mucosa moist. Eyes: Pupils are equal, round, and reactive to light. Conjunctivae are normal. No scleral icterus.  Neck: Normal range of motion. Neck supple. No JVD present.  Cardiovascular: Normal rate, regular rhythm and normal heart sounds.  Exam reveals no gallop and no friction rub.   No murmur heard. Pulmonary/Chest: Effort normal and breath sounds normal. No respiratory distress. No wheezes.No rales.  Abdominal: Soft. Bowel sounds are normal. No distension. There is no tenderness. There is no guarding.  Musculoskeletal: No edema or tenderness.  Lymphadenopathy:No cervical, axillary or supraclavicular adenopathy.  Neurological: Alert and oriented to person, place, and time. No cranial nerve deficit.  Skin: Skin is warm and dry. No rash noted. No erythema. No pallor.  Psychiatric: Affect and judgment normal.   Labs Appointment on 08/18/2017  Component Date Value Ref Range Status  . WBC 08/18/2017 45.2* 4.0 - 10.5 K/uL Final  . RBC 08/18/2017 4.66  4.22 - 5.81 MIL/uL Final  . Hemoglobin 08/18/2017 13.4  13.0 - 17.0 g/dL Final  . HCT 08/18/2017 45.9  39.0 - 52.0 % Final  . MCV 08/18/2017 98.5  78.0 - 100.0 fL Final  . MCH 08/18/2017 28.8  26.0 - 34.0 pg Final  . MCHC 08/18/2017 29.2* 30.0 - 36.0 g/dL Final  . RDW 08/18/2017 14.6  11.5 - 15.5 % Final  . Platelets 08/18/2017 153  150 - 400 K/uL Final  . Neutrophils Relative % 08/18/2017 6  % Final  . Lymphocytes Relative 08/18/2017 93  % Final  . Monocytes Relative 08/18/2017 1  % Final  . Eosinophils Relative 08/18/2017 0  % Final  . Basophils Relative 08/18/2017 0  % Final  . Neutro Abs 08/18/2017 2.7  1.7 - 7.7 K/uL Final  . Lymphs Abs 08/18/2017 42.0* 0.7 -  4.0 K/uL Final  . Monocytes Absolute 08/18/2017 0.5  0.1 - 1.0 K/uL Final  . Eosinophils Absolute 08/18/2017 0.0  0.0 - 0.7 K/uL Final  . Basophils Absolute 08/18/2017 0.0  0.0 - 0.1 K/uL Final  . WBC Morphology 08/18/2017 SMUDGE CELLS   Final  Comment: ABSOLUTE LYMPHOCYTOSIS ATYPICAL LYMPHOCYTES Performed at [facility], [address] [place], [place] [address]   . Sodium 08/18/2017 141  135 - 145 mmol/L Final  . Potassium 08/18/2017 4.1  3.5 - 5.1 mmol/L Final  . Chloride 08/18/2017 106  101 - 111 mmol/L Final  . CO2 08/18/2017 26  22 - 32 mmol/L Final  . Glucose, Bld 08/18/2017 117* 65 - 99 mg/dL Final  . BUN 08/18/2017 18  6 - 20 mg/dL Final  . Creatinine, Ser 08/18/2017 1.43* 0.61 - 1.24 mg/dL Final  . Calcium 08/18/2017 9.3  8.9 - 10.3 mg/dL Final  . Total Protein 08/18/2017 6.8  6.5 - 8.1 g/dL Final  . Albumin 08/18/2017 4.0  3.5 - 5.0 g/dL Final  . AST 08/18/2017 25  15 - 41 U/L Final  . ALT 08/18/2017 13* 17 - 63 U/L Final  . Alkaline Phosphatase 08/18/2017 47  38 - 126 U/L Final  . Total Bilirubin 08/18/2017 1.2  0.3 - 1.2 mg/dL Final  . GFR calc non Af Amer 08/18/2017 47* >60 mL/min Final  . GFR calc Af Amer 08/18/2017 55* >60 mL/min Final   Comment: (NOTE) The eGFR has been calculated using the CKD EPI equation. This calculation has not been validated in all clinical situations. eGFR's persistently <60 mL/min signify possible Chronic Kidney Disease.   Georgiann Hahn gap 08/18/2017 9  5 - 15 Final   Performed at [facility], [address] [place], [place] [address]  . LDH 08/18/2017 147  98 - 192 U/L Final   Performed at [facility], [address] [place], [place] [address]     Pathology Orders Placed This Encounter  Procedures  . CBC with Differential/Platelet    Standing Status:   Future    Standing Expiration Date:   08/19/2018  . Comprehensive metabolic panel    Standing Status:   Future    Standing Expiration Date:   08/19/2018  . Lactate dehydrogenase     Standing Status:   Future    Standing Expiration Date:   08/19/2018       Zoila Shutter MD

## 2017-10-24 ENCOUNTER — Other Ambulatory Visit: Payer: Self-pay | Admitting: Gastroenterology

## 2017-11-02 ENCOUNTER — Other Ambulatory Visit: Payer: Self-pay | Admitting: Cardiology

## 2017-11-02 NOTE — Telephone Encounter (Signed)
Rx sent to pharmacy   

## 2017-12-21 ENCOUNTER — Encounter (HOSPITAL_COMMUNITY): Payer: Self-pay | Admitting: Internal Medicine

## 2017-12-21 ENCOUNTER — Inpatient Hospital Stay (HOSPITAL_COMMUNITY): Payer: Medicare HMO

## 2017-12-21 ENCOUNTER — Other Ambulatory Visit: Payer: Self-pay

## 2017-12-21 ENCOUNTER — Inpatient Hospital Stay (HOSPITAL_COMMUNITY): Payer: Medicare HMO | Attending: Internal Medicine | Admitting: Internal Medicine

## 2017-12-21 VITALS — BP 139/58 | HR 53 | Temp 97.7°F | Resp 18 | Wt 159.7 lb

## 2017-12-21 DIAGNOSIS — C9110 Chronic lymphocytic leukemia of B-cell type not having achieved remission: Secondary | ICD-10-CM | POA: Diagnosis not present

## 2017-12-21 DIAGNOSIS — K5900 Constipation, unspecified: Secondary | ICD-10-CM | POA: Diagnosis not present

## 2017-12-21 DIAGNOSIS — I10 Essential (primary) hypertension: Secondary | ICD-10-CM | POA: Diagnosis not present

## 2017-12-21 DIAGNOSIS — Z87891 Personal history of nicotine dependence: Secondary | ICD-10-CM

## 2017-12-21 LAB — CBC WITH DIFFERENTIAL/PLATELET
Basophils Absolute: 0.1 10*3/uL (ref 0.0–0.1)
Basophils Relative: 0 %
Eosinophils Absolute: 0.2 10*3/uL (ref 0.0–0.7)
Eosinophils Relative: 1 %
HCT: 43.9 % (ref 39.0–52.0)
Hemoglobin: 13.6 g/dL (ref 13.0–17.0)
Lymphocytes Relative: 91 %
Lymphs Abs: 45.1 10*3/uL (ref 0.7–4.0)
MCH: 30.5 pg (ref 26.0–34.0)
MCHC: 31 g/dL (ref 30.0–36.0)
MCV: 98.4 fL (ref 78.0–100.0)
Monocytes Absolute: 0.7 10*3/uL (ref 0.1–1.0)
Monocytes Relative: 1 %
Neutro Abs: 3.6 10*3/uL (ref 1.7–7.7)
Neutrophils Relative %: 7 %
Platelets: 182 10*3/uL (ref 150–400)
RBC: 4.46 MIL/uL (ref 4.22–5.81)
RDW: 14.7 % (ref 11.5–15.5)
WBC: 49.7 10*3/uL — ABNORMAL HIGH (ref 4.0–10.5)

## 2017-12-21 LAB — COMPREHENSIVE METABOLIC PANEL
ALT: 13 U/L (ref 0–44)
AST: 23 U/L (ref 15–41)
Albumin: 4.1 g/dL (ref 3.5–5.0)
Alkaline Phosphatase: 45 U/L (ref 38–126)
Anion gap: 6 (ref 5–15)
BUN: 19 mg/dL (ref 8–23)
CO2: 27 mmol/L (ref 22–32)
Calcium: 9.3 mg/dL (ref 8.9–10.3)
Chloride: 110 mmol/L (ref 98–111)
Creatinine, Ser: 1.4 mg/dL — ABNORMAL HIGH (ref 0.61–1.24)
GFR calc Af Amer: 56 mL/min — ABNORMAL LOW (ref >60)
GFR calc non Af Amer: 48 mL/min — ABNORMAL LOW (ref >60)
Glucose, Bld: 108 mg/dL — ABNORMAL HIGH (ref 70–99)
Potassium: 4.1 mmol/L (ref 3.5–5.1)
Sodium: 143 mmol/L (ref 135–145)
Total Bilirubin: 1.3 mg/dL — ABNORMAL HIGH (ref 0.3–1.2)
Total Protein: 7 g/dL (ref 6.5–8.1)

## 2017-12-21 LAB — LACTATE DEHYDROGENASE: LDH: 127 U/L (ref 98–192)

## 2017-12-21 NOTE — Progress Notes (Signed)
Diagnosis CLL (chronic lymphocytic leukemia) ([place]) - Plan: CBC with Differential/Platelet, Comprehensive metabolic panel, Lactate dehydrogenase, Beta 2 microglobulin, serum  Staging Cancer Staging No matching staging information was found for the patient.  Assessment and Plan:  1.   CLL.  Stage 0.  Pt was previously followed by Dr. Talbert Cage.  Pt had CT abdomen and pelvis done 09/07/2017 that showed  IMPRESSION: 1. Fluid-filled colon as can be seen with diarrhea. 2. Diverticulosis without evidence of diverticulitis. 3. Cholelithiasis. 4. Enlarged prostate gland. 5. Mild retroperitoneal para-aortic lymphadenopathy. Interval improvement compared with 03/12/2014.  Labs done 12/21/2017 reviewed with pt and shows WBC 49.7 previously 53.8, HB 13.6 plts 182,000.  Cr 1.4, LDH 127.  PT will remain on observation and will RTC 05/2018 for follow-up and repeat labs.  He is advised to notify the office if he has any problems prior to his next visit.    2.  Hypertension.  BP is 139/58.  Follow-up with PCP.     3.  Constipation.  Stool softeners as recommended.  Follow-up with GI if no improvement.    4.  Health maintenance.  GI follow-up as recommended.    Current Status:  Pt is seen today for follow-up.  He is here to go over labs.  He denies any B symptoms such as night sweats, weight loss, fevers or chills, or itching. He has noted no adenopathy.    Problem List Patient Active Problem List   Diagnosis Date Noted  . GERD (gastroesophageal reflux disease) [K21.9] 03/08/2016  . Esophageal dysphagia [R13.10] 03/08/2016  . Chronic cough [R05] 03/08/2016  . Ischemic cardiomyopathy [I25.5]   . STEMI - 06/16/15 [I21.3] 06/16/2015  . Benign essential HTN [I10] 06/16/2015  . Myocardial infarction of inferolateral wall ([place]) [I21.19] 06/16/2015  . Cholelithiasis without cholecystitis [K80.20] 12/18/2013  . Hematochezia [K92.1] 06/04/2013  . Lower leg edema [R60.0] 03/14/2013  . PTSD (post-traumatic stress  disorder) [F43.10]   . CAD S/P PCI-- RCA DES 2014,  CFX DES Jan 2017 [I25.10, Z98.61] 11/24/2012  . NSTEMI (non-ST elevated myocardial infarction) - history of [I21.4] 11/23/2012  . Hyperglycemia [R73.9] 11/23/2012  . History of smoking [Z87.891] 11/23/2012  . Dyslipidemia, goal LDL below 70 - on simvastatin; monitored by PCP [E78.5] 11/23/2012  . Diverticulitis of colon without hemorrhage- on antibiotics for recent flair up [K57.32] 09/19/2012  . Hx of adenomatous colonic polyps [Z86.010] 09/19/2012  . CLL (chronic lymphocytic leukemia) ([place]) [C91.90] 04/08/2011  . BPH (benign prostatic hyperplasia) [N40.0] 04/08/2011    Past Medical History Past Medical History:  Diagnosis Date  . Acute MI, inferolateral wall, initial episode of care ([place]) 06/16/15   99% dCx   . BPH (benign prostatic hyperplasia) 04/08/2011  . CAD S/P percutaneous coronary angioplasty 11/23/12; 06/16/15   a. 2 overlapping mRCA lesions - Xience Xpedition DES 2.75 mm x 18 mm (3.0 mm);; b. Inf-Lat STEMI 06/16/2015 - dCx Asp Thrombectomy & PCI 3.0 x 15 Xience drDES (3.6 mm)  . Chronic headache   . CLL (chronic lymphocytic leukemia) ([place]) 04/08/2011  . Depression   . Diverticulitis   . Gallstone   . GERD (gastroesophageal reflux disease)   . Hypercholesteremia   . Ischemic cardiomyopathy    Echo 06/18/2015 EF 35-40% after lateral MI -> repeat echo 08/06/2015: EF 40-45% with basal-mid inferolateral akinesis and hypokinesis of the lateral wall.  . NSTEMI (non-ST elevated myocardial infarction) ([place]) 11/23/12   NSTEMI 11/23/2012 RCA lesion - PCI with DES; moderate LAD disease  . PTSD (post-traumatic stress  disorder)    With Depression - since MI    Past Surgical History Past Surgical History:  Procedure Laterality Date  . APPENDECTOMY  1968-70  . CARDIAC CATHETERIZATION N/A 06/16/2015   Procedure: Left Heart Cath and Coronary Angiography;  Surgeon: Jettie Booze, MD;  Location: [facility];  Service:  Cardiovascular: 99% thrombotic dCx -> asp thrombectomy & PCI. RCA Stents Patent.   Marland Kitchen CARDIAC CATHETERIZATION N/A 06/16/2015   Procedure: Coronary Stent Intervention;  Surgeon: Jettie Booze, MD;  Location: [facility];  Service: Cardiovascular: PCI dCx = thrombectomy -> 3.0 x 15 Xience drug-eluting stent (3.6 mm)   . COLONOSCOPY  2008   IWO:[id number] diverticula, diminutive polyp in the cecum, s/p bx Normal rectum. Tubular adenoma  . COLONOSCOPY N/A 10/17/2012   Dr. Gala Romney: colonic diverticulosis, tubular adenoma, surveillance due May 2019  . CORONARY STENT PLACEMENT  11/23/12   Mid RCA -- Xience eXp - 2.75 mm x 18 mm DES (3.64m) , Dr. HEllyn Hack . LEFT HEART CATHETERIZATION WITH CORONARY ANGIOGRAM N/A 11/23/2012   Procedure: LEFT HEART CATHETERIZATION WITH CORONARY ANGIOGRAM;  Surgeon: DLeonie Man MD;  [facility];  Service: Cardiovascular: NSTEMI: Culprit = mid RCA 95% & 75%; LAD ~40-50%, Small RI ~60%; EF ~60%, mild inf-basal HK     . TRANSTHORACIC ECHOCARDIOGRAM  January 2017; March 2017   a. EF 35-40%. Entire inferior hypokinesis and akinesis of the basal and mid inferolateral and anterolateral /distal lateral walls;; b. EF 40 and 45%. Akinesis of basal-mid inferolateral wall. Hypokinesis of entire lateral wall.    Family History Family History  Problem Relation Age of Onset  . Diabetes Mother   . Cancer Father   . Colon cancer Neg Hx      Social History  reports that he quit smoking about 54 years ago. His smoking use included cigarettes. He has a 0.50 pack-year smoking history. He has never used smokeless tobacco. He reports that he does not drink alcohol or use drugs.  Medications  Current Outpatient Medications:  .  azithromycin (ZITHROMAX) 250 MG tablet, Take 1 tablet by mouth daily., Disp: , Rfl: 0 .  Cholecalciferol (D3-1000) 1000 UNITS capsule, Take 1,000 Units by mouth 2 (two) times a week.  , Disp: , Rfl:  .  clopidogrel (PLAVIX) 75 MG tablet, TAKE  1 TABLET BY MOUTH DAILY., Disp: 90 tablet, Rfl: 3 .  Cyanocobalamin (VITAMIN B-12) 2500 MCG SUBL, Place 1 tablet under the tongue once a week. Reported on 06/25/2015, Disp: , Rfl:  .  ferrous sulfate 325 (65 FE) MG tablet, Take 325 mg by mouth 3 (three) times a week. , Disp: , Rfl:  .  FOLIC ACID PO, Take 1 tablet by mouth 3 (three) times a week., Disp: , Rfl:  .  hydrochlorothiazide (HYDRODIURIL) 12.5 MG tablet, Take 12.5 mg by mouth as needed., Disp: , Rfl:  .  L-Arginine 500 MG CAPS, Take 1 capsule by mouth every 30 (thirty) days. Reported on 06/25/2015, Disp: , Rfl:  .  lisinopril (PRINIVIL,ZESTRIL) 2.5 MG tablet, Take 1 tablet (2.5 mg total) by mouth daily., Disp: 90 tablet, Rfl: 3 .  metoprolol succinate (TOPROL-XL) 25 MG 24 hr tablet, TAKE 1/2 TABLET BY MOUTH DAILY., Disp: 45 tablet, Rfl: 0 .  Multiple Vitamins-Minerals (ZINC PO), Take 1 tablet by mouth every 30 (thirty) days., Disp: , Rfl:  .  nitroGLYCERIN (NITROSTAT) 0.4 MG SL tablet, Place 1 tablet (0.4 mg total) under the tongue every 5 (  five) minutes as needed for chest pain., Disp: 25 tablet, Rfl: 3 .  ondansetron (ZOFRAN ODT) 4 MG disintegrating tablet, Take 1 tablet (4 mg total) by mouth every 8 (eight) hours as needed for nausea or vomiting., Disp: 6 tablet, Rfl: 0 .  OVER THE COUNTER MEDICATION, Take 1 tablet by mouth 3 (three) times a week. Reported on 06/25/2015, Disp: , Rfl:  .  pantoprazole (PROTONIX) 40 MG tablet, TAKE 1 TABLET BY MOUTH TWICE A DAY, Disp: 60 tablet, Rfl: 5 .  pyridoxine (B-6) 100 MG tablet, Take 100 mg by mouth 3 (three) times a week. Reported on 06/25/2015, Disp: , Rfl:  .  simvastatin (ZOCOR) 40 MG tablet, TAKE 1 TABLET (40 MG TOTAL) BY MOUTH EVERY OTHER DAY., Disp: 15 tablet, Rfl: 11 .  VITAMIN A PO, Take 1 capsule by mouth 3 (three) times a week., Disp: , Rfl:  .  vitamin C (ASCORBIC ACID) 500 MG tablet, Take 500 mg by mouth 3 (three) times a week. , Disp: , Rfl:   Allergies Patient has no known  allergies.  Review of Systems Review of Systems - Oncology ROS negative other than constipation   Physical Exam  Vitals Wt Readings from Last 3 Encounters:  12/21/17 159 lb 11.2 oz (72.4 kg)  09/07/17 165 lb (74.8 kg)  08/18/17 170 lb 9.6 oz (77.4 kg)   Temp Readings from Last 3 Encounters:  12/21/17 97.7 F (36.5 C) (Oral)  09/07/17 97.7 F (36.5 C) (Oral)  08/18/17 97.6 F (36.4 C) (Oral)   BP Readings from Last 3 Encounters:  12/21/17 (!) 139/58  09/07/17 (!) 156/88  08/18/17 (!) 129/52   Pulse Readings from Last 3 Encounters:  12/21/17 (!) 53  09/07/17 (!) 104  08/18/17 (!) 56    Constitutional: Well-developed, well-nourished, and in no distress.   HENT: Head: Normocephalic and atraumatic.  Mouth/Throat: No oropharyngeal exudate. Mucosa moist. Eyes: Pupils are equal, round, and reactive to light. Conjunctivae are normal. No scleral icterus.  Neck: Normal range of motion. Neck supple. No JVD present.  Cardiovascular: Normal rate, regular rhythm and normal heart sounds.  Exam reveals no gallop and no friction rub.   No murmur heard. Pulmonary/Chest: Effort normal and breath sounds normal. No respiratory distress. No wheezes.No rales.  Abdominal: Soft. Bowel sounds are normal. No distension. There is no tenderness. There is no guarding.  Musculoskeletal: No edema or tenderness.  Lymphadenopathy: No cervical, axillary or supraclavicular adenopathy.  Neurological: Alert and oriented to person, place, and time. No cranial nerve deficit.  Skin: Skin is warm and dry. No rash noted. No erythema. No pallor.  Psychiatric: Affect and judgment normal.   Labs Appointment on 12/21/2017  Component Date Value Ref Range Status  . WBC 12/21/2017 49.7* 4.0 - 10.5 K/uL Final   WHITE COUNT CONFIRMED ON SMEAR  . RBC 12/21/2017 4.46  4.22 - 5.81 MIL/uL Final  . Hemoglobin 12/21/2017 13.6  13.0 - 17.0 g/dL Final  . HCT 12/21/2017 43.9  39.0 - 52.0 % Final  . MCV 12/21/2017 98.4   78.0 - 100.0 fL Final  . MCH 12/21/2017 30.5  26.0 - 34.0 pg Final  . MCHC 12/21/2017 31.0  30.0 - 36.0 g/dL Final  . RDW 12/21/2017 14.7  11.5 - 15.5 % Final  . Platelets 12/21/2017 182  150 - 400 K/uL Final  . Neutrophils Relative % 12/21/2017 7  % Final  . Neutro Abs 12/21/2017 3.6  1.7 - 7.7 K/uL Final  . Lymphocytes Relative 12/21/2017  91  % Final  . Lymphs Abs 12/21/2017 45.1  0.7 - 4.0 K/uL Final  . Monocytes Relative 12/21/2017 1  % Final  . Monocytes Absolute 12/21/2017 0.7  0.1 - 1.0 K/uL Final  . Eosinophils Relative 12/21/2017 1  % Final  . Eosinophils Absolute 12/21/2017 0.2  0.0 - 0.7 K/uL Final  . Basophils Relative 12/21/2017 0  % Final  . Basophils Absolute 12/21/2017 0.1  0.0 - 0.1 K/uL Final  . WBC Morphology 12/21/2017 ABSOLUTE LYMPHOCYTOSIS   Final   Comment: ATYPICAL LYMPHOCYTES SMUDGE CELLS Performed at [facility], [address] [place], [place] [address]   . Sodium 12/21/2017 143  135 - 145 mmol/L Final  . Potassium 12/21/2017 4.1  3.5 - 5.1 mmol/L Final  . Chloride 12/21/2017 110  98 - 111 mmol/L Final   Please note change in reference range.  . CO2 12/21/2017 27  22 - 32 mmol/L Final  . Glucose, Bld 12/21/2017 108* 70 - 99 mg/dL Final   Please note change in reference range.  . BUN 12/21/2017 19  8 - 23 mg/dL Final   Please note change in reference range.  . Creatinine, Ser 12/21/2017 1.40* 0.61 - 1.24 mg/dL Final  . Calcium 12/21/2017 9.3  8.9 - 10.3 mg/dL Final  . Total Protein 12/21/2017 7.0  6.5 - 8.1 g/dL Final  . Albumin 12/21/2017 4.1  3.5 - 5.0 g/dL Final  . AST 12/21/2017 23  15 - 41 U/L Final  . ALT 12/21/2017 13  0 - 44 U/L Final   Please note change in reference range.  . Alkaline Phosphatase 12/21/2017 45  38 - 126 U/L Final  . Total Bilirubin 12/21/2017 1.3* 0.3 - 1.2 mg/dL Final  . GFR calc non Af Amer 12/21/2017 48* >60 mL/min Final  . GFR calc Af Amer 12/21/2017 56* >60 mL/min Final   Comment: (NOTE) The eGFR has been calculated  using the CKD EPI equation. This calculation has not been validated in all clinical situations. eGFR's persistently <60 mL/min signify possible Chronic Kidney Disease.   Georgiann Hahn gap 12/21/2017 6  5 - 15 Final   Performed at [facility], [address], [place], [place] [address]  . LDH 12/21/2017 127  98 - 192 U/L Final   Performed at [facility], [address], [place], [place] [address]     Pathology Orders Placed This Encounter  Procedures  . CBC with Differential/Platelet    Standing Status:   Future    Standing Expiration Date:   12/22/2018  . Comprehensive metabolic panel    Standing Status:   Future    Standing Expiration Date:   12/22/2018  . Lactate dehydrogenase    Standing Status:   Future    Standing Expiration Date:   12/22/2018  . Beta 2 microglobulin, serum    Standing Status:   Future    Standing Expiration Date:   12/22/2018       Zoila Shutter MD

## 2017-12-21 NOTE — Patient Instructions (Signed)
[facility] Discharge Instructions  Today you saw Dr. Higgs.    Thank you for choosing [facility] at [facility] to provide your oncology and hematology care.  To afford each patient quality time with our provider, please arrive at least 15 minutes before your scheduled appointment time.   If you have a lab appointment with the Cancer Center please come in thru the  Main Entrance and check in at the main information desk  You need to re-schedule your appointment should you arrive 10 or more minutes late.  We strive to give you quality time with our providers, and arriving late affects you and other patients whose appointments are after yours.  Also, if you no show three or more times for appointments you may be dismissed from the clinic at the providers discretion.     Again, thank you for choosing [facility].  Our hope is that these requests will decrease the amount of time that you wait before being seen by our physicians.       _____________________________________________________________  Should you have questions after your visit to [facility], please contact our office at [phone] between the hours of 8:30 a.m. and 4:30 p.m.  Voicemails left after 4:30 p.m. will not be returned until the following business day.  For prescription refill requests, have your pharmacy contact our office.       Resources For Cancer Patients and their Caregivers ? American Cancer Society: Can assist with transportation, wigs, general needs, runs Look Good Feel Better.        1-888-227-6333 ? Cancer Care: Provides financial assistance, online support groups, medication/co-pay assistance.  1-800-813-HOPE (4673) ? [facility] Assists [place] cancer patients and their families through emotional , educational and financial support.  336-427-4357 ? [place] DSS Where to apply for  food stamps, Medicaid and utility assistance. 336-342-1394 ? [facility]: Transportation to medical appointments. 336-347-2287 ? Social Security Administration: May apply for disability if have a Stage IV cancer. 336-342-7796 1-800-772-1213 ? [place] Aging, Disability and Transit Services: Assists with nutrition, care and transit needs. 336-349-2343  Cancer Center Support Programs:   > Cancer Support Group  2nd Tuesday of the month 1pm-2pm, Journey Room   > Creative Journey  3rd Tuesday of the month 1130am-1pm, Journey Room    

## 2018-01-17 ENCOUNTER — Ambulatory Visit (INDEPENDENT_AMBULATORY_CARE_PROVIDER_SITE_OTHER): Payer: Self-pay

## 2018-01-17 DIAGNOSIS — Z86010 Personal history of colonic polyps: Secondary | ICD-10-CM

## 2018-01-17 MED ORDER — PEG 3350-KCL-NA BICARB-NACL 420 G PO SOLR: 4000.0000 mL | ORAL | 0 refills | Status: DC

## 2018-01-17 NOTE — Progress Notes (Signed)
Ok to schedule. Hold iron 7 days before TCS.

## 2018-01-17 NOTE — Patient Instructions (Signed)
[name]   [birth year] MRN: [id number]    Procedure Date: 02/21/18 Time to register: 11:00 Place to register: [facility] Stay Procedure Time: 12:00 Scheduled provider: R. Garfield Cornea, MD  PREPARATION FOR COLONOSCOPY WITH TRI-LYTE SPLIT PREP  Please notify us immediately if you are diabetic, take iron supplements, or if you are on Coumadin or any other blood thinners.     You will need to purchase 1 fleet enema and 1 box of Bisacodyl '5mg'$  tablets.   2 DAYS BEFORE PROCEDURE:  DATE: 02/19/18   DAY: Monday Begin clear liquid diet AFTER your lunch meal. NO SOLID FOODS after this point.  1 DAY BEFORE PROCEDURE:  DATE: 02/20/18  DAY: Tuesday Continue clear liquids the entire day - NO SOLID FOOD.     At 2:00 pm:  Take 2 Bisacodyl tablets.   At 4:00pm:  Start drinking your solution. Make sure you mix well per instructions on the bottle. Try to drink 1 (one) 8 ounce glass every 10-15 minutes until you have consumed HALF the jug. You should complete by 6:00pm.You must keep the left over solution refrigerated until completed next day.  Continue clear liquids. You must drink plenty of clear liquids to prevent dehyration and kidney failure.     DAY OF PROCEDURE:   DATE: 02/21/18   DAY: Wednesday If you take medications for your heart, blood pressure or breathing, you may take these medications.    Five hours before your procedure time @ 7:00am:  Finish remaining amout of bowel prep, drinking 1 (one) 8 ounce glass every 10-15 minutes until complete. You have two hours to consume remaining prep.   Three hours before your procedure time '@9'$ :00am:  Nothing by mouth.   At least one hour before going to the hospital:  Give yourself one Fleet enema. You may take your morning medications with sip of water unless we have instructed otherwise.      Please see below for Dietary Information.  CLEAR LIQUIDS INCLUDE:  Water Jello (NOT red in color)   Ice Popsicles (NOT red in color)    Tea (sugar ok, no milk/cream) Powdered fruit flavored drinks  Coffee (sugar ok, no milk/cream) Gatorade/ Lemonade/ Kool-Aid  (NOT red in color)   Juice: apple, white grape, white cranberry Soft drinks  Clear bullion, consomme, broth (fat free beef/chicken/vegetable)  Carbonated beverages (any kind)  Strained chicken noodle soup Hard Candy   Remember: Clear liquids are liquids that will allow you to see your fingers on the other side of a clear glass. Be sure liquids are NOT red in color, and not cloudy, but CLEAR.  DO NOT EAT OR DRINK ANY OF THE FOLLOWING:  Dairy products of any kind   Cranberry juice Tomato juice / V8 juice   Grapefruit juice Orange juice     Red grape juice  Do not eat any solid foods, including such foods as: cereal, oatmeal, yogurt, fruits, vegetables, creamed soups, eggs, bread, crackers, pureed foods in a blender, etc.   HELPFUL HINTS FOR DRINKING PREP SOLUTION:   Make sure prep is extremely cold. Mix and refrigerate the the morning of the prep. You may also put in the freezer.   You may try mixing some Crystal Light or Country Time Lemonade if you prefer. Mix in small amounts; add more if necessary.  Try drinking through a straw  Rinse mouth with water or a mouthwash between glasses, to remove after-taste.  Try sipping on a cold beverage /ice/ popsicles between glasses  of prep.  Place a piece of sugar-free hard candy in mouth between glasses.  If you become nauseated, try consuming smaller amounts, or stretch out the time between glasses. Stop for 30-60 minutes, then slowly start back drinking.        OTHER INSTRUCTIONS  You will need a responsible adult at least 74 years of age to accompany you and drive you home. This person must remain in the waiting room during your procedure. The hospital will cancel your procedure if you do not have a responsible adult with you.   1. Wear loose fitting clothing that is easily removed. 2. Leave jewelry and  other valuables at home.  3. Remove all body piercing jewelry and leave at home. 4. Total time from sign-in until discharge is approximately 2-3 hours. 5. You should go home directly after your procedure and rest. You can resume normal activities the day after your procedure. 6. The day of your procedure you should not:  Drive  Make legal decisions  Operate machinery  Drink alcohol  Return to work   You may call the office (Dept: [phone]) before 5:00pm, or page the doctor on call [phone]) after 5:00pm, for further instructions, if necessary.   Insurance Information YOU WILL NEED TO CHECK WITH YOUR INSURANCE COMPANY FOR THE BENEFITS OF COVERAGE YOU HAVE FOR THIS PROCEDURE.  UNFORTUNATELY, NOT ALL INSURANCE COMPANIES HAVE BENEFITS TO COVER ALL OR PART OF THESE TYPES OF PROCEDURES.  IT IS YOUR RESPONSIBILITY TO CHECK YOUR BENEFITS, HOWEVER, WE WILL BE GLAD TO ASSIST YOU WITH ANY CODES YOUR INSURANCE COMPANY MAY NEED.    PLEASE NOTE THAT MOST INSURANCE COMPANIES WILL NOT COVER A SCREENING COLONOSCOPY FOR PEOPLE UNDER THE AGE OF 50  IF YOU HAVE BCBS INSURANCE, YOU MAY HAVE BENEFITS FOR A SCREENING COLONOSCOPY BUT IF POLYPS ARE FOUND THE DIAGNOSIS WILL CHANGE AND THEN YOU MAY HAVE A DEDUCTIBLE THAT WILL NEED TO BE MET. SO PLEASE MAKE SURE YOU CHECK YOUR BENEFITS FOR A SCREENING COLONOSCOPY AS WELL AS A DIAGNOSTIC COLONOSCOPY.

## 2018-01-17 NOTE — Progress Notes (Signed)
Gastroenterology Pre-Procedure Review  Request Date:01/17/18 Requesting Physician: Dr.Higgs (last tcs 10/17/12 RMR- tubular adenoma)  PATIENT REVIEW QUESTIONS: The patient responded to the following health history questions as indicated:    1. Diabetes Melitis: no 2. Joint replacements in the past 12 months: no 3. Major health problems in the past 3 months: no 4. Has an artificial valve or MVP: no 5. Has a defibrillator: no 6. Has been advised in past to take antibiotics in advance of a procedure like teeth cleaning: no 7. Family history of colon cancer: no  8. Alcohol Use: no 9. History of sleep apnea: no  10. History of coronary artery or other vascular stents placed within the last 12 months: no 11. History of any prior anesthesia complications: no    MEDICATIONS & ALLERGIES:    Patient reports the following regarding taking any blood thinners:   Plavix? yes (75mg ) Aspirin? yes (81mg ) Coumadin? no Brilinta? no Xarelto? no Eliquis? no Pradaxa? no Savaysa? no Effient? no  Patient confirms/reports the following medications:  Current Outpatient Medications  Medication Sig Dispense Refill  . aspirin EC 81 MG tablet Take 81 mg by mouth daily.    . clopidogrel (PLAVIX) 75 MG tablet TAKE 1 TABLET BY MOUTH DAILY. 90 tablet 3  . ferrous sulfate 325 (65 FE) MG tablet Take 325 mg by mouth 3 (three) times a week.     Marland Kitchen FOLIC ACID PO Take 1 tablet by mouth 3 (three) times a week.    Marland Kitchen lisinopril (PRINIVIL,ZESTRIL) 2.5 MG tablet Take 1 tablet (2.5 mg total) by mouth daily. 90 tablet 3  . nitroGLYCERIN (NITROSTAT) 0.4 MG SL tablet Place 1 tablet (0.4 mg total) under the tongue every 5 (five) minutes as needed for chest pain. 25 tablet 3  . pantoprazole (PROTONIX) 40 MG tablet TAKE 1 TABLET BY MOUTH TWICE A DAY 60 tablet 5  . simvastatin (ZOCOR) 40 MG tablet TAKE 1 TABLET (40 MG TOTAL) BY MOUTH EVERY OTHER DAY. 15 tablet 11  . UNABLE TO FIND daily. Med Name: super beta prostate     No  current facility-administered medications for this visit.     Patient confirms/reports the following allergies:  No Known Allergies  No orders of the defined types were placed in this encounter.   AUTHORIZATION INFORMATION Primary Insurance: Bernadene Person,  ID #: [id number] Pre-Cert / Auth required: no  SCHEDULE INFORMATION: Procedure has been scheduled as follows:  Date: 02/21/18, Time: 12:00 Location: [facility] Dr.Rourk  This Gastroenterology Pre-Precedure Review Form is being routed to the following provider(s): Neil Crouch, PA

## 2018-01-18 NOTE — Progress Notes (Signed)
Mailed letter to the pt with iron instructions.

## 2018-02-21 ENCOUNTER — Ambulatory Visit (HOSPITAL_COMMUNITY)
Admission: RE | Admit: 2018-02-21 | Discharge: 2018-02-21 | Disposition: A | Discharge status: Home / Self Care | Payer: Medicare HMO | Source: Ambulatory Visit | Attending: Internal Medicine | Admitting: Internal Medicine

## 2018-02-21 ENCOUNTER — Encounter (HOSPITAL_COMMUNITY): Payer: Self-pay | Admitting: *Deleted

## 2018-02-21 ENCOUNTER — Other Ambulatory Visit: Payer: Self-pay

## 2018-02-21 ENCOUNTER — Encounter (HOSPITAL_COMMUNITY)
Admission: RE | Disposition: A | Discharge status: Home / Self Care | Payer: Self-pay | Source: Ambulatory Visit | Attending: Internal Medicine

## 2018-02-21 DIAGNOSIS — E7800 Pure hypercholesterolemia, unspecified: Secondary | ICD-10-CM | POA: Insufficient documentation

## 2018-02-21 DIAGNOSIS — Z1211 Encounter for screening for malignant neoplasm of colon: Secondary | ICD-10-CM | POA: Diagnosis not present

## 2018-02-21 DIAGNOSIS — N400 Enlarged prostate without lower urinary tract symptoms: Secondary | ICD-10-CM | POA: Diagnosis not present

## 2018-02-21 DIAGNOSIS — K219 Gastro-esophageal reflux disease without esophagitis: Secondary | ICD-10-CM | POA: Insufficient documentation

## 2018-02-21 DIAGNOSIS — I255 Ischemic cardiomyopathy: Secondary | ICD-10-CM | POA: Insufficient documentation

## 2018-02-21 DIAGNOSIS — I252 Old myocardial infarction: Secondary | ICD-10-CM | POA: Diagnosis not present

## 2018-02-21 DIAGNOSIS — F329 Major depressive disorder, single episode, unspecified: Secondary | ICD-10-CM | POA: Insufficient documentation

## 2018-02-21 DIAGNOSIS — Z79899 Other long term (current) drug therapy: Secondary | ICD-10-CM | POA: Diagnosis not present

## 2018-02-21 DIAGNOSIS — Z809 Family history of malignant neoplasm, unspecified: Secondary | ICD-10-CM | POA: Diagnosis not present

## 2018-02-21 DIAGNOSIS — I2510 Atherosclerotic heart disease of native coronary artery without angina pectoris: Secondary | ICD-10-CM | POA: Insufficient documentation

## 2018-02-21 DIAGNOSIS — Z87891 Personal history of nicotine dependence: Secondary | ICD-10-CM | POA: Insufficient documentation

## 2018-02-21 DIAGNOSIS — Z955 Presence of coronary angioplasty implant and graft: Secondary | ICD-10-CM | POA: Insufficient documentation

## 2018-02-21 DIAGNOSIS — R51 Headache: Secondary | ICD-10-CM | POA: Insufficient documentation

## 2018-02-21 DIAGNOSIS — Z7982 Long term (current) use of aspirin: Secondary | ICD-10-CM | POA: Diagnosis not present

## 2018-02-21 DIAGNOSIS — K5730 Diverticulosis of large intestine without perforation or abscess without bleeding: Secondary | ICD-10-CM | POA: Insufficient documentation

## 2018-02-21 DIAGNOSIS — C9110 Chronic lymphocytic leukemia of B-cell type not having achieved remission: Secondary | ICD-10-CM | POA: Insufficient documentation

## 2018-02-21 DIAGNOSIS — F4310 Post-traumatic stress disorder, unspecified: Secondary | ICD-10-CM | POA: Diagnosis not present

## 2018-02-21 DIAGNOSIS — Z860100 Personal history of colon polyps, unspecified: Secondary | ICD-10-CM

## 2018-02-21 DIAGNOSIS — Z833 Family history of diabetes mellitus: Secondary | ICD-10-CM | POA: Diagnosis not present

## 2018-02-21 HISTORY — PX: COLONOSCOPY: SHX5424

## 2018-02-21 SURGERY — COLONOSCOPY
Anesthesia: Moderate Sedation

## 2018-02-21 MED ORDER — MIDAZOLAM HCL 5 MG/5ML IJ SOLN
INTRAMUSCULAR | Status: AC
  Filled 2018-02-21: qty 5

## 2018-02-21 MED ORDER — MIDAZOLAM HCL 5 MG/5ML IJ SOLN
INTRAMUSCULAR | Status: DC | PRN
  Administered 2018-02-21 (×2): 1 mg via INTRAVENOUS

## 2018-02-21 MED ORDER — MEPERIDINE HCL 100 MG/ML IJ SOLN
INTRAMUSCULAR | Status: DC | PRN
  Administered 2018-02-21: 25 mg

## 2018-02-21 MED ORDER — SODIUM CHLORIDE 0.9 % IV SOLN
INTRAVENOUS | Status: DC
  Administered 2018-02-21: 11:00:00 via INTRAVENOUS

## 2018-02-21 MED ORDER — ONDANSETRON HCL 4 MG/2ML IJ SOLN
INTRAMUSCULAR | Status: AC
  Filled 2018-02-21: qty 2

## 2018-02-21 MED ORDER — MEPERIDINE HCL 50 MG/ML IJ SOLN
INTRAMUSCULAR | Status: AC
  Filled 2018-02-21: qty 1

## 2018-02-21 MED ORDER — ONDANSETRON HCL 4 MG/2ML IJ SOLN
INTRAMUSCULAR | Status: DC | PRN
  Administered 2018-02-21: 4 mg via INTRAVENOUS

## 2018-02-21 NOTE — H&P (Signed)
@LOGO @   Primary Care Physician:  [name], MD Primary Gastroenterologist:  Dr. Gala Romney  Pre-Procedure History & Physical: HPI:  [name] is a 72 y.o. male here for surveillance colonoscopy. History of colonic adenoma.  Past Medical History:  Diagnosis Date  . Acute MI, inferolateral wall, initial episode of care ([place]) 06/16/15   99% dCx   . BPH (benign prostatic hyperplasia) 04/08/2011  . CAD S/P percutaneous coronary angioplasty 11/23/12; 06/16/15   a. 2 overlapping mRCA lesions - Xience Xpedition DES 2.75 mm x 18 mm (3.0 mm);; b. Inf-Lat STEMI 06/16/2015 - dCx Asp Thrombectomy & PCI 3.0 x 15 Xience drDES (3.6 mm)  . Chronic headache   . CLL (chronic lymphocytic leukemia) ([place]) 04/08/2011  . Depression   . Diverticulitis   . Gallstone   . GERD (gastroesophageal reflux disease)   . Hypercholesteremia   . Ischemic cardiomyopathy    Echo 06/18/2015 EF 35-40% after lateral MI -> repeat echo 08/06/2015: EF 40-45% with basal-mid inferolateral akinesis and hypokinesis of the lateral wall.  . NSTEMI (non-ST elevated myocardial infarction) ([place]) 11/23/12   NSTEMI 11/23/2012 RCA lesion - PCI with DES; moderate LAD disease  . PTSD (post-traumatic stress disorder)    With Depression - since MI    Past Surgical History:  Procedure Laterality Date  . APPENDECTOMY  1968-70  . CARDIAC CATHETERIZATION N/A 06/16/2015   Procedure: Left Heart Cath and Coronary Angiography;  Surgeon: Jettie Booze, MD;  Location: [facility];  Service: Cardiovascular: 99% thrombotic dCx -> asp thrombectomy & PCI. RCA Stents Patent.   Marland Kitchen CARDIAC CATHETERIZATION N/A 06/16/2015   Procedure: Coronary Stent Intervention;  Surgeon: Jettie Booze, MD;  Location: [facility];  Service: Cardiovascular: PCI dCx = thrombectomy -> 3.0 x 15 Xience drug-eluting stent (3.6 mm)   . COLONOSCOPY  2008   ONG:[id number] diverticula, diminutive polyp in the cecum, s/p bx Normal rectum. Tubular adenoma  .  COLONOSCOPY N/A 10/17/2012   Dr. Gala Romney: colonic diverticulosis, tubular adenoma, surveillance due May 2019  . CORONARY STENT PLACEMENT  11/23/12   Mid RCA -- Xience eXp - 2.75 mm x 18 mm DES (3.83mm) , Dr. Ellyn Hack  . LEFT HEART CATHETERIZATION WITH CORONARY ANGIOGRAM N/A 11/23/2012   Procedure: LEFT HEART CATHETERIZATION WITH CORONARY ANGIOGRAM;  Surgeon: Leonie Man, MD;  Location: [facility];  Service: Cardiovascular: NSTEMI: Culprit = mid RCA 95% & 75%; LAD ~40-50%, Small RI ~60%; EF ~60%, mild inf-basal HK     . TRANSTHORACIC ECHOCARDIOGRAM  January 2017; March 2017   a. EF 35-40%. Entire inferior hypokinesis and akinesis of the basal and mid inferolateral and anterolateral /distal lateral walls;; b. EF 40 and 45%. Akinesis of basal-mid inferolateral wall. Hypokinesis of entire lateral wall.    Prior to Admission medications   Medication Sig Start Date End Date Taking? Authorizing Provider  aspirin EC 81 MG tablet Take 81 mg by mouth every 30 (thirty) days.    Yes [provider]  cholecalciferol (VITAMIN D) 1000 units tablet Take 1,000 Units by mouth once a week.   Yes [provider]  clopidogrel (PLAVIX) 75 MG tablet TAKE 1 TABLET BY MOUTH DAILY. 11/02/17  Yes Leonie Man, MD  lisinopril (PRINIVIL,ZESTRIL) 2.5 MG tablet Take 1 tablet (2.5 mg total) by mouth daily. 06/18/15  Yes Almyra Deforest, PA  pantoprazole (PROTONIX) 40 MG tablet TAKE 1 TABLET BY MOUTH TWICE A DAY Patient taking differently: Take 40 mg by mouth daily.  10/26/17  Yes Carlis Stable, NP  simvastatin (ZOCOR) 40 MG tablet TAKE 1 TABLET (40 MG TOTAL) BY MOUTH EVERY OTHER DAY. 09/04/17  Yes Leonie Man, MD  nitroGLYCERIN (NITROSTAT) 0.4 MG SL tablet Place 1 tablet (0.4 mg total) under the tongue every 5 (five) minutes as needed for chest pain. 09/30/15   Leonie Man, MD    Allergies as of 01/17/2018  . (No Known Allergies)    Family History  Problem Relation Age of Onset  . Diabetes Mother   .  Cancer Father   . Colon cancer Neg Hx     Social History   Socioeconomic History  . Marital status: Married    Spouse name: Not on file  . Number of children: Not on file  . Years of education: Not on file  . Highest education level: Not on file  Occupational History  . Not on file  Social Needs  . Financial resource strain: Not on file  . Food insecurity:    Worry: Not on file    Inability: Not on file  . Transportation needs:    Medical: Not on file    Non-medical: Not on file  Tobacco Use  . Smoking status: Former Smoker    Packs/day: 0.25    Years: 2.00    Pack years: 0.50    Types: Cigarettes    Last attempt to quit: 06/07/1963    Years since quitting: 54.7  . Smokeless tobacco: Never Used  Substance and Sexual Activity  . Alcohol use: No    Alcohol/week: 0.0 standard drinks    Comment: Occasionally drinks red wine  . Drug use: No  . Sexual activity: Not on file  Lifestyle  . Physical activity:    Days per week: Not on file    Minutes per session: Not on file  . Stress: Not on file  Relationships  . Social connections:    Talks on phone: Not on file    Gets together: Not on file    Attends religious service: Not on file    Active member of club or organization: Not on file    Attends meetings of clubs or organizations: Not on file    Relationship status: Not on file  . Intimate partner violence:    Fear of current or ex partner: Not on file    Emotionally abused: Not on file    Physically abused: Not on file    Forced sexual activity: Not on file  Other Topics Concern  . Not on file  Social History Narrative   Currently in Cardiac Rehabilitation   "former smoker". Does not drink EtOH   Married.    Review of Systems: See HPI, otherwise negative ROS  Physical Exam: BP 137/67   Pulse (!) 57   Temp 97.6 F (36.4 C) (Oral)   Resp (!) 24   SpO2 98%  General:   Alert,  Well-developed, well-nourished, pleasant and cooperative in NAD Lungs:  Clear  throughout to auscultation.   No wheezes, crackles, or rhonchi. No acute distress. Heart:  Regular rate and rhythm; no murmurs, clicks, rubs,  or gallops. Abdomen: Non-distended, normal bowel sounds.  Soft and nontender without appreciable mass or hepatosplenomegaly.  Pulses:  Normal pulses noted. Extremities:  Without clubbing or edema.  Impression/Plan:  72 year old gentleman with history of multiple colonic adenomas removed during multiple colonoscopies over time.   Due for surveillance colonoscopy today. The risks, benefits, limitations, alternatives and imponderables have been reviewed  with the patient. Questions have been answered. All parties are agreeable.      Notice: This dictation was prepared with Dragon dictation along with smaller phrase technology. Any transcriptional errors that result from this process are unintentional and may not be corrected upon review.

## 2018-02-21 NOTE — Discharge Instructions (Signed)
Colonoscopy Discharge Instructions  Read the instructions outlined below and refer to this sheet in the next few weeks. These discharge instructions provide you with general information on caring for yourself after you leave the hospital. Your doctor may also give you specific instructions. While your treatment has been planned according to the most current medical practices available, unavoidable complications occasionally occur. If you have any problems or questions after discharge, call Dr. Gala Romney at [phone]. ACTIVITY  You may resume your regular activity, but move at a slower pace for the next 24 hours.   Take frequent rest periods for the next 24 hours.   Walking will help get rid of the air and reduce the bloated feeling in your belly (abdomen).   No driving for 24 hours (because of the medicine (anesthesia) used during the test).    Do not sign any important legal documents or operate any machinery for 24 hours (because of the anesthesia used during the test).  NUTRITION  Drink plenty of fluids.   You may resume your normal diet as instructed by your doctor.   Begin with a light meal and progress to your normal diet. Heavy or fried foods are harder to digest and may make you feel sick to your stomach (nauseated).   Avoid alcoholic beverages for 24 hours or as instructed.  MEDICATIONS  You may resume your normal medications unless your doctor tells you otherwise.  WHAT YOU CAN EXPECT TODAY  Some feelings of bloating in the abdomen.   Passage of more gas than usual.   Spotting of blood in your stool or on the toilet paper.  IF YOU HAD POLYPS REMOVED DURING THE COLONOSCOPY:  No aspirin products for 7 days or as instructed.   No alcohol for 7 days or as instructed.   Eat a soft diet for the next 24 hours.  FINDING OUT THE RESULTS OF YOUR TEST Not all test results are available during your visit. If your test results are not back during the visit, make an appointment  with your caregiver to find out the results. Do not assume everything is normal if you have not heard from your caregiver or the medical facility. It is important for you to follow up on all of your test results.  SEEK IMMEDIATE MEDICAL ATTENTION IF:  You have more than a spotting of blood in your stool.   Your belly is swollen (abdominal distention).   You are nauseated or vomiting.   You have a temperature over 101.   You have abdominal pain or discomfort that is severe or gets worse throughout the day.    Colon diverticulosis information provided  I do not recommend a future colonoscopy unless new symptoms develop.   Diverticulosis Diverticulosis is a condition that develops when small pouches (diverticula) form in the wall of the large intestine (colon). The colon is where water is absorbed and stool is formed. The pouches form when the inside layer of the colon pushes through weak spots in the outer layers of the colon. You may have a few pouches or many of them. What are the causes? The cause of this condition is not known. What increases the risk? The following factors may make you more likely to develop this condition:  Being older than age 57. Your risk for this condition increases with age. Diverticulosis is rare among people younger than age 53. By age 46, many people have it.  Eating a low-fiber diet.  Having frequent constipation.  Being overweight.  Not getting enough exercise.  Smoking.  Taking over-the-counter pain medicines, like aspirin and ibuprofen.  Having a family history of diverticulosis.  What are the signs or symptoms? In most people, there are no symptoms of this condition. If you do have symptoms, they may include:  Bloating.  Cramps in the abdomen.  Constipation or diarrhea.  Pain in the lower left side of the abdomen.  How is this diagnosed? This condition is most often diagnosed during an exam for other colon problems. Because  diverticulosis usually has no symptoms, it often cannot be diagnosed independently. This condition may be diagnosed by:  Using a flexible scope to examine the colon (colonoscopy).  Taking an X-ray of the colon after dye has been put into the colon (barium enema).  Doing a CT scan.  How is this treated? You may not need treatment for this condition if you have never developed an infection related to diverticulosis. If you have had an infection before, treatment may include:  Eating a high-fiber diet. This may include eating more fruits, vegetables, and grains.  Taking a fiber supplement.  Taking a live bacteria supplement (probiotic).  Taking medicine to relax your colon.  Taking antibiotic medicines.  Follow these instructions at home:  Drink 6-8 glasses of water or more each day to prevent constipation.  Try not to strain when you have a bowel movement.  If you have had an infection before: ? Eat more fiber as directed by your health care provider or your diet and nutrition specialist (dietitian). ? Take a fiber supplement or probiotic, if your health care provider approves.  Take over-the-counter and prescription medicines only as told by your health care provider.  If you were prescribed an antibiotic, take it as told by your health care provider. Do not stop taking the antibiotic even if you start to feel better.  Keep all follow-up visits as told by your health care provider. This is important. Contact a health care provider if:  You have pain in your abdomen.  You have bloating.  You have cramps.  You have not had a bowel movement in 3 days. Get help right away if:  Your pain gets worse.  Your bloating becomes very bad.  You have a fever or chills, and your symptoms suddenly get worse.  You vomit.  You have bowel movements that are bloody or black.  You have bleeding from your rectum. Summary  Diverticulosis is a condition that develops when small  pouches (diverticula) form in the wall of the large intestine (colon).  You may have a few pouches or many of them.  This condition is most often diagnosed during an exam for other colon problems.  If you have had an infection related to diverticulosis, treatment may include increasing the fiber in your diet, taking supplements, or taking medicines. This information is not intended to replace advice given to you by your health care provider. Make sure you discuss any questions you have with your health care provider.

## 2018-02-21 NOTE — Op Note (Signed)
[facility] Patient Name: [name] Procedure Date: 02/21/2018 11:22 AM MRN: [id number] Date of Birth: May 09, 1946 Attending MD: Norvel Richards , MD CSN: [id number] Age: 72 Admit Type: Outpatient Procedure:                Colonoscopy Indications:              High risk colon cancer surveillance: Personal                            history of colonic polyps Providers:                Norvel Richards, MD, Janeece Riggers, RN, Nelma Rothman, Technician Referring MD:              Medicines:                Midazolam 2 mg IV, Meperidine 25 mg IV Complications:            No immediate complications. Estimated Blood Loss:     Estimated blood loss: none. Procedure:                Pre-Anesthesia Assessment:                           - Prior to the procedure, a History and Physical                            was performed, and patient medications and                            allergies were reviewed. The patient's tolerance of                            previous anesthesia was also reviewed. The risks                            and benefits of the procedure and the sedation                            options and risks were discussed with the patient.                            All questions were answered, and informed consent                            was obtained. Prior Anticoagulants: The patient has                            taken no previous anticoagulant or antiplatelet                            agents. ASA Grade Assessment: II - A patient with  mild systemic disease. After reviewing the risks                            and benefits, the patient was deemed in                            satisfactory condition to undergo the procedure.                           After obtaining informed consent, the colonoscope                            was passed under direct vision. Throughout the                            procedure, the  patient's blood pressure, pulse, and                            oxygen saturations were monitored continuously. The                            CF-HQ190L ([id number]) scope was introduced through                            the anus and advanced to the the cecum, identified                            by appendiceal orifice and ileocecal valve. The                            colonoscopy was performed without difficulty. The                            patient tolerated the procedure well. The quality                            of the bowel preparation was adequate. The                            ileocecal valve, appendiceal orifice, and rectum                            were photographed. The quality of the bowel                            preparation was adequate. Scope In: 11:37:23 AM Scope Out: 11:48:15 AM Scope Withdrawal Time: 0 hours 6 minutes 48 seconds  Total Procedure Duration: 0 hours 10 minutes 52 seconds  Findings:      The perianal and digital rectal examinations were normal.      Many small and large-mouthed diverticula were found in the sigmoid colon       and descending colon.      The exam was otherwise without abnormality on direct and retroflexion       views. Impression:               -  Diverticulosis in the sigmoid colon and in the                            descending colon.                           - The examination was otherwise normal on direct                            and retroflexion views.                           - No specimens collected. Moderate Sedation:      Moderate (conscious) sedation was administered by the endoscopy nurse       and supervised by the endoscopist. The following parameters were       monitored: oxygen saturation, heart rate, blood pressure, respiratory       rate, EKG, adequacy of pulmonary ventilation, and response to care.       Total physician intraservice time was 14 minutes. Recommendation:           - Patient has a contact number  available for                            emergencies. The signs and symptoms of potential                            delayed complications were discussed with the                            patient. Return to normal activities tomorrow.                            Written discharge instructions were provided to the                            patient.                           - Resume previous diet.                           - I do not recommend a future colonoscopy unless                            new symptoms develop. Procedure Code(s):        --- Professional ---                           [phone], Colonoscopy, flexible; diagnostic, including                            collection of specimen(s) by brushing or washing,                            when performed (separate procedure)  G0500, Moderate sedation services provided by the                            same physician or other qualified health care                            professional performing a gastrointestinal                            endoscopic service that sedation supports,                            requiring the presence of an independent trained                            observer to assist in the monitoring of the                            patient's level of consciousness and physiological                            status; initial 15 minutes of intra-service time;                            patient age 64 years or older (additional time may                            be reported with [phone], as appropriate) Diagnosis Code(s):        --- Professional ---                           Z86.010, Personal history of colonic polyps                           K57.30, Diverticulosis of large intestine without                            perforation or abscess without bleeding CPT copyright 2017 [employer]. All rights reserved. The codes documented in this report are preliminary and upon  coder review may  be revised to meet current compliance requirements. Cristopher Estimable. Kona Lover, MD Norvel Richards, MD 02/21/2018 11:54:51 AM This report has been signed electronically. Number of Addenda: 0

## 2018-02-26 ENCOUNTER — Encounter (HOSPITAL_COMMUNITY): Payer: Self-pay | Admitting: Internal Medicine

## 2018-03-06 ENCOUNTER — Encounter: Payer: Self-pay | Admitting: Nurse Practitioner

## 2018-03-06 ENCOUNTER — Ambulatory Visit: Payer: Medicare HMO | Admitting: Nurse Practitioner

## 2018-03-06 VITALS — BP 118/62 | HR 67 | Temp 97.1°F | Ht 69.0 in | Wt 154.6 lb

## 2018-03-06 DIAGNOSIS — R1030 Lower abdominal pain, unspecified: Secondary | ICD-10-CM

## 2018-03-06 DIAGNOSIS — K219 Gastro-esophageal reflux disease without esophagitis: Secondary | ICD-10-CM

## 2018-03-06 DIAGNOSIS — K5900 Constipation, unspecified: Secondary | ICD-10-CM | POA: Diagnosis not present

## 2018-03-06 DIAGNOSIS — R109 Unspecified abdominal pain: Secondary | ICD-10-CM | POA: Insufficient documentation

## 2018-03-06 NOTE — Assessment & Plan Note (Signed)
The patient describes constipation with hard stools, straining, occasionally incomplete emptying.  He is tried magnesium citrate which is helped some.  He did not take the entire bottle, just a little bit.  Previously has had mild constipation but seems to be worse.  Recent colonoscopy reassuring.  I will start him on Amitiza my 8 mcg twice daily with samples and request a progress report 1 to 2 weeks.  I suspect his constipation is the primary etiology behind his abdominal pain, as per below.  Follow-up in 3 months.

## 2018-03-06 NOTE — Progress Notes (Signed)
Referring Provider: [name], MD Primary Care Physician:  [name], MD Primary GI:  Dr. Gala Romney  Chief Complaint  Patient presents with  . Abdominal Pain    comes/goes  . Constipation    straining with BM's    HPI:   [name] is a 72 y.o. male who presents for abdominal pain since procedure.  Patient has not been seen in our office since 04/12/2016 when he presented for GERD and dysphasia.  At that time it was noted he has typical reflux symptoms well controlled on Protonix 40 mg twice daily.  He is on Plavix at that time.  Overall GERD felt to be well controlled, cough improved, dysphasia persists.  History of colon adenoma due for surveillance in 2019.  Recommended continue Protonix, barium pill esophagram, follow-up in 3 months.  BPE completed 04/19/2016 which found small sliding hiatal hernia with stricture at the GE junction query Schatzki's ring which obstructed a 12.5 mm barium tablet.  Deemed to likely benefit from EGD with dilation, best if off Plavix.  It appears the patient did not follow through with recommended EGD.  More recently the patient underwent a colonoscopy 02/21/2018 for high risk colon cancer surveillance with a personal history of colon polyps.  Findings include diverticulosis of the sigmoid and descending colon, otherwise normal and no specimens collected.  Recommended no future colonoscopy unless new symptoms develop.  Today he states he's doing ok overall. He has been having abdominal pain mid-lower and RLQ, described as crampy, occurs about daily. He has also been constipated, took a little bit of magnesium citrate which he feels helped him have a bowel movement. Stools have been hard, straining; occasional incomplete emptying. Last good, full BM was about 2 weeks ago and abdominal pain improved at that time. Hasn't been eating as much due to symptoms. Denies hematochezia, melena, fever, chills, unintentional weight loss. GERD doing well on  PPI. Denies chest pain, dyspnea, dizziness, lightheadedness, syncope, near syncope. Denies any other upper or lower GI symptoms.  Past Medical History:  Diagnosis Date  . Acute MI, inferolateral wall, initial episode of care ([place]) 06/16/15   99% dCx   . BPH (benign prostatic hyperplasia) 04/08/2011  . CAD S/P percutaneous coronary angioplasty 11/23/12; 06/16/15   a. 2 overlapping mRCA lesions - Xience Xpedition DES 2.75 mm x 18 mm (3.0 mm);; b. Inf-Lat STEMI 06/16/2015 - dCx Asp Thrombectomy & PCI 3.0 x 15 Xience drDES (3.6 mm)  . Chronic headache   . CLL (chronic lymphocytic leukemia) ([place]) 04/08/2011  . Depression   . Diverticulitis   . Gallstone   . GERD (gastroesophageal reflux disease)   . Hypercholesteremia   . Ischemic cardiomyopathy    Echo 06/18/2015 EF 35-40% after lateral MI -> repeat echo 08/06/2015: EF 40-45% with basal-mid inferolateral akinesis and hypokinesis of the lateral wall.  . NSTEMI (non-ST elevated myocardial infarction) ([place]) 11/23/12   NSTEMI 11/23/2012 RCA lesion - PCI with DES; moderate LAD disease  . PTSD (post-traumatic stress disorder)    With Depression - since MI    Past Surgical History:  Procedure Laterality Date  . APPENDECTOMY  1968-70  . CARDIAC CATHETERIZATION N/A 06/16/2015   Procedure: Left Heart Cath and Coronary Angiography;  Surgeon: Jettie Booze, MD;  Location: [place] [facility];  Service: Cardiovascular: 99% thrombotic dCx -> asp thrombectomy & PCI. RCA Stents Patent.   Marland Kitchen CARDIAC CATHETERIZATION N/A 06/16/2015   Procedure: Coronary Stent Intervention;  Surgeon: Charlann Lange  Irish Lack, MD;  Location: [place];  Service: Cardiovascular: PCI dCx = thrombectomy -> 3.0 x 15 Xience drug-eluting stent (3.6 mm)   . COLONOSCOPY  2008   [id number] diverticula, diminutive polyp in the cecum, s/p bx Normal rectum. Tubular adenoma  . COLONOSCOPY N/A 10/17/2012   Dr. Gala Romney: colonic diverticulosis, tubular adenoma, surveillance due May 2019  .  COLONOSCOPY N/A 02/21/2018   Procedure: COLONOSCOPY;  Surgeon: Daneil Dolin, MD;  [facility];  Service: Endoscopy;  Laterality: N/A;  12:00  . CORONARY STENT PLACEMENT  11/23/12   Mid RCA -- Xience eXp - 2.75 mm x 18 mm DES (3.75mm) , Dr. Ellyn Hack  . LEFT HEART CATHETERIZATION WITH CORONARY ANGIOGRAM N/A 11/23/2012   Procedure: LEFT HEART CATHETERIZATION WITH CORONARY ANGIOGRAM;  Surgeon: Leonie Man, MD;  Location: [facility];  Service: Cardiovascular: NSTEMI: Culprit = mid RCA 95% & 75%; LAD ~40-50%, Small RI ~60%; EF ~60%, mild inf-basal HK     . TRANSTHORACIC ECHOCARDIOGRAM  January 2017; March 2017   a. EF 35-40%. Entire inferior hypokinesis and akinesis of the basal and mid inferolateral and anterolateral /distal lateral walls;; b. EF 40 and 45%. Akinesis of basal-mid inferolateral wall. Hypokinesis of entire lateral wall.    Current Outpatient Medications  Medication Sig Dispense Refill  . cholecalciferol (VITAMIN D) 1000 units tablet Take 1,000 Units by mouth once a week.    . clopidogrel (PLAVIX) 75 MG tablet TAKE 1 TABLET BY MOUTH DAILY. 90 tablet 3  . lisinopril (PRINIVIL,ZESTRIL) 2.5 MG tablet Take 1 tablet (2.5 mg total) by mouth daily. 90 tablet 3  . nitroGLYCERIN (NITROSTAT) 0.4 MG SL tablet Place 1 tablet (0.4 mg total) under the tongue every 5 (five) minutes as needed for chest pain. 25 tablet 3  . pantoprazole (PROTONIX) 40 MG tablet TAKE 1 TABLET BY MOUTH TWICE A DAY (Patient taking differently: Take 40 mg by mouth daily. ) 60 tablet 5  . simvastatin (ZOCOR) 40 MG tablet TAKE 1 TABLET (40 MG TOTAL) BY MOUTH EVERY OTHER DAY. 15 tablet 11   No current facility-administered medications for this visit.     Allergies as of 03/06/2018  . (No Known Allergies)    Family History  Problem Relation Age of Onset  . Diabetes Mother   . Cancer Father   . Colon cancer Neg Hx     Social History   Socioeconomic History  . Marital status: Married    Spouse name:  Not on file  . Number of children: Not on file  . Years of education: Not on file  . Highest education level: Not on file  Occupational History  . Not on file  Social Needs  . Financial resource strain: Not on file  . Food insecurity:    Worry: Not on file    Inability: Not on file  . Transportation needs:    Medical: Not on file    Non-medical: Not on file  Tobacco Use  . Smoking status: Former Smoker    Packs/day: 0.25    Years: 2.00    Pack years: 0.50    Types: Cigarettes    Last attempt to quit: 06/07/1963    Years since quitting: 54.7  . Smokeless tobacco: Never Used  Substance and Sexual Activity  . Alcohol use: No    Alcohol/week: 0.0 standard drinks    Comment: Occasionally drinks red wine  . Drug use: No  . Sexual activity: Not on file  Lifestyle  .  Physical activity:    Days per week: Not on file    Minutes per session: Not on file  . Stress: Not on file  Relationships  . Social connections:    Talks on phone: Not on file    Gets together: Not on file    Attends religious service: Not on file    Active member of club or organization: Not on file    Attends meetings of clubs or organizations: Not on file    Relationship status: Not on file  Other Topics Concern  . Not on file  Social History Narrative   Currently in Cardiac Rehabilitation   "former smoker". Does not drink EtOH   Married.    Review of Systems: General: Negative for anorexia, weight loss, fever, chills, fatigue, weakness. ENT: Negative for hoarseness, difficulty swallowing. CV: Negative for chest pain, angina, palpitations, peripheral edema.  Respiratory: Negative for dyspnea at rest, cough, sputum, wheezing.  GI: See history of present illness. Endo: Negative for unusual weight change.  Heme: Negative for bruising or bleeding. Allergy: Negative for rash or hives.   Physical Exam: BP 118/62   Pulse 67   Temp (!) 97.1 F (36.2 C) (Oral)   Ht 5\' 9"  (1.753 m)   Wt 154 lb 9.6 oz  (70.1 kg)   BMI 22.83 kg/m  General:   Alert and oriented. Pleasant and cooperative. Well-nourished and well-developed.  Eyes:  Without icterus, sclera clear and conjunctiva pink.  Ears:  Normal auditory acuity. Cardiovascular:  S1, S2 present without murmurs appreciated. Extremities without clubbing or edema. Respiratory:  Clear to auscultation bilaterally. No wheezes, rales, or rhonchi. No distress.  Gastrointestinal:  +BS, soft, and non-distended. Mild TTP lower abdomen. No HSM noted. No guarding or rebound. No masses appreciated.  Rectal:  Deferred  Musculoskalatal:  Symmetrical without gross deformities. Neurologic:  Alert and oriented x4;  grossly normal neurologically. Psych:  Alert and cooperative. Normal mood and affect. Heme/Lymph/Immune: No excessive bruising noted.    03/06/2018 9:18 AM   Disclaimer: This note was dictated with voice recognition software. Similar sounding words can inadvertently be transcribed and may not be corrected upon review.

## 2018-03-06 NOTE — Assessment & Plan Note (Signed)
The patient presents with lower abdominal pain associated with constipation, hard stools, straining.  He had constipation previously but never like this.  He just had a colonoscopy within the past month which was essentially normal and reassuring.  At this point his abdominal pain is likely due to constipation.  GERD symptoms are well controlled.  Mild abdominal tenderness to the lower abdomen.  We will start him on Amitiza 8 mcg twice daily with samples and requested progress report in 1 to 2 weeks.  Follow-up in 3 months.

## 2018-03-06 NOTE — Patient Instructions (Signed)
1. I am giving you samples of Amitiza 8 mcg.  Take this twice a day, on a full stomach. 2. Call us in 1 to 2 weeks and let us know if it is helping her constipation and abdominal pain. 3. Return for follow-up in 3 months. 4. Call us if you have any questions or concerns.  At [facility] we value your feedback. You may receive a survey about your visit today. Please share your experience as we strive to create trusting relationships with our patients to provide genuine, compassionate, quality care.  We appreciate your understanding and patience as we review any laboratory studies, imaging, and other diagnostic tests that are ordered as we care for you. Our office policy is 5 business days for review of these results, and any emergent or urgent results are addressed in a timely manner for your best interest. If you do not hear from our office in 1 week, please contact us.   We also encourage the use of MyChart, which contains your medical information for your review as well. If you are not enrolled in this feature, an access code is on this after visit summary for your convenience. Thank you for allowing [place] to be involved in your care.  It was great to meet you today!  I hope you have a great Fall!!

## 2018-03-06 NOTE — Progress Notes (Signed)
CC'D TO PCP °

## 2018-03-06 NOTE — Assessment & Plan Note (Signed)
Symptoms well controlled on PPI.  Recommend he continue his current medications and follow-up in 3 months.

## 2018-04-05 ENCOUNTER — Ambulatory Visit (HOSPITAL_COMMUNITY)
Admission: RE | Admit: 2018-04-05 | Discharge: 2018-04-05 | Disposition: A | Discharge status: Home / Self Care | Payer: Medicare HMO | Source: Ambulatory Visit | Attending: Internal Medicine | Admitting: Internal Medicine

## 2018-04-05 ENCOUNTER — Other Ambulatory Visit (HOSPITAL_COMMUNITY): Payer: Self-pay | Admitting: Internal Medicine

## 2018-04-05 DIAGNOSIS — J188 Other pneumonia, unspecified organism: Secondary | ICD-10-CM

## 2018-04-18 ENCOUNTER — Other Ambulatory Visit: Payer: Self-pay | Admitting: Nurse Practitioner

## 2018-05-07 ENCOUNTER — Inpatient Hospital Stay (HOSPITAL_COMMUNITY): Payer: Medicare HMO

## 2018-05-11 ENCOUNTER — Inpatient Hospital Stay (HOSPITAL_COMMUNITY): Payer: Medicare HMO | Attending: Hematology

## 2018-05-11 DIAGNOSIS — Z87891 Personal history of nicotine dependence: Secondary | ICD-10-CM | POA: Diagnosis not present

## 2018-05-11 DIAGNOSIS — N189 Chronic kidney disease, unspecified: Secondary | ICD-10-CM | POA: Insufficient documentation

## 2018-05-11 DIAGNOSIS — C9110 Chronic lymphocytic leukemia of B-cell type not having achieved remission: Secondary | ICD-10-CM | POA: Diagnosis not present

## 2018-05-11 LAB — COMPREHENSIVE METABOLIC PANEL
ALT: 11 U/L (ref 0–44)
AST: 23 U/L (ref 15–41)
Albumin: 4.1 g/dL (ref 3.5–5.0)
Alkaline Phosphatase: 47 U/L (ref 38–126)
Anion gap: 5 (ref 5–15)
BUN: 14 mg/dL (ref 8–23)
CO2: 26 mmol/L (ref 22–32)
Calcium: 9.1 mg/dL (ref 8.9–10.3)
Chloride: 110 mmol/L (ref 98–111)
Creatinine, Ser: 1.35 mg/dL — ABNORMAL HIGH (ref 0.61–1.24)
GFR calc Af Amer: 60 mL/min — ABNORMAL LOW (ref >60)
GFR calc non Af Amer: 51 mL/min — ABNORMAL LOW (ref >60)
Glucose, Bld: 102 mg/dL — ABNORMAL HIGH (ref 70–99)
Potassium: 4.5 mmol/L (ref 3.5–5.1)
Sodium: 141 mmol/L (ref 135–145)
Total Bilirubin: 0.7 mg/dL (ref 0.3–1.2)
Total Protein: 6.9 g/dL (ref 6.5–8.1)

## 2018-05-11 LAB — CBC WITH DIFFERENTIAL/PLATELET
Abs Immature Granulocytes: 0.05 10*3/uL (ref 0.00–0.07)
Basophils Absolute: 0 10*3/uL (ref 0.0–0.1)
Basophils Relative: 0 %
Eosinophils Absolute: 0.1 10*3/uL (ref 0.0–0.5)
Eosinophils Relative: 0 %
HCT: 44.3 % (ref 39.0–52.0)
Hemoglobin: 12.9 g/dL — ABNORMAL LOW (ref 13.0–17.0)
Immature Granulocytes: 0 %
Lymphocytes Relative: 92 %
Lymphs Abs: 40.9 10*3/uL — ABNORMAL HIGH (ref 0.7–4.0)
MCH: 29.1 pg (ref 26.0–34.0)
MCHC: 29.1 g/dL — ABNORMAL LOW (ref 30.0–36.0)
MCV: 99.8 fL (ref 80.0–100.0)
Monocytes Absolute: 0.4 10*3/uL (ref 0.1–1.0)
Monocytes Relative: 1 %
Neutro Abs: 3.3 10*3/uL (ref 1.7–7.7)
Neutrophils Relative %: 7 %
Platelets: 168 10*3/uL (ref 150–400)
RBC: 4.44 MIL/uL (ref 4.22–5.81)
RDW: 14.6 % (ref 11.5–15.5)
WBC: 44.8 10*3/uL — ABNORMAL HIGH (ref 4.0–10.5)
nRBC: 0 % (ref 0.0–0.2)

## 2018-05-11 LAB — LACTATE DEHYDROGENASE: LDH: 119 U/L (ref 98–192)

## 2018-05-12 LAB — BETA 2 MICROGLOBULIN, SERUM: Beta-2 Microglobulin: 2.6 mg/L — ABNORMAL HIGH (ref 0.6–2.4)

## 2018-05-14 ENCOUNTER — Other Ambulatory Visit: Payer: Self-pay

## 2018-05-14 ENCOUNTER — Inpatient Hospital Stay (HOSPITAL_BASED_OUTPATIENT_CLINIC_OR_DEPARTMENT_OTHER): Payer: Medicare HMO | Admitting: Hematology

## 2018-05-14 ENCOUNTER — Encounter (HOSPITAL_COMMUNITY): Payer: Self-pay | Admitting: Hematology

## 2018-05-14 VITALS — BP 136/59 | HR 59 | Temp 97.6°F | Resp 18 | Wt 159.0 lb

## 2018-05-14 DIAGNOSIS — Z87891 Personal history of nicotine dependence: Secondary | ICD-10-CM | POA: Diagnosis not present

## 2018-05-14 DIAGNOSIS — N189 Chronic kidney disease, unspecified: Secondary | ICD-10-CM

## 2018-05-14 DIAGNOSIS — C9110 Chronic lymphocytic leukemia of B-cell type not having achieved remission: Secondary | ICD-10-CM

## 2018-05-14 NOTE — Patient Instructions (Signed)
[facility] at [facility] Discharge Instructions     Thank you for choosing [facility] at [facility] to provide your oncology and hematology care.  To afford each patient quality time with our provider, please arrive at least 15 minutes before your scheduled appointment time.   If you have a lab appointment with the Cancer Center please come in thru the  Main Entrance and check in at the main information desk  You need to re-schedule your appointment should you arrive 10 or more minutes late.  We strive to give you quality time with our providers, and arriving late affects you and other patients whose appointments are after yours.  Also, if you no show three or more times for appointments you may be dismissed from the clinic at the providers discretion.     Again, thank you for choosing [facility].  Our hope is that these requests will decrease the amount of time that you wait before being seen by our physicians.       _____________________________________________________________  Should you have questions after your visit to [facility], please contact our office at [phone] between the hours of 8:00 a.m. and 4:30 p.m.  Voicemails left after 4:00 p.m. will not be returned until the following business day.  For prescription refill requests, have your pharmacy contact our office and allow 72 hours.    Cancer Center Support Programs:   > Cancer Support Group  2nd Tuesday of the month 1pm-2pm, Journey Room    

## 2018-05-14 NOTE — Progress Notes (Signed)
[facility] [place], [place] [address]   CLINIC:  Medical Oncology/Hematology  PCP:  [name], MD [place] [address] [phone]   REASON FOR VISIT: Follow-up for Chronic lymphocytic leukemia CLL  CURRENT THERAPY: Observation    INTERVAL HISTORY:  [name] 72 y.o. male returns for routine follow-up for Chronic lymphocytic leukemia. He is here today and doing well. He has no complaints at this time. He just got over a cold but denies any fevers or other recent infections. Denies any bleeding or easy bruising. Denies any nausea, vomiting, or diarrhea. Denies any chills, night sweats or weight loss. He reports his appetite at 100% and he has no problem maintaining hie weight. His energy level is 75%. He lives at home and performs all his own ADLs and activities.     REVIEW OF SYSTEMS:  Review of Systems  All other systems reviewed and are negative.    PAST MEDICAL/SURGICAL HISTORY:  Past Medical History:  Diagnosis Date  . Acute MI, inferolateral wall, initial episode of care ([place]) 06/16/15   99% dCx   . BPH (benign prostatic hyperplasia) 04/08/2011  . CAD S/P percutaneous coronary angioplasty 11/23/12; 06/16/15   a. 2 overlapping mRCA lesions - Xience Xpedition DES 2.75 mm x 18 mm (3.0 mm);; b. Inf-Lat STEMI 06/16/2015 - dCx Asp Thrombectomy & PCI 3.0 x 15 Xience drDES (3.6 mm)  . Chronic headache   . CLL (chronic lymphocytic leukemia) ([place]) 04/08/2011  . Depression   . Diverticulitis   . Gallstone   . GERD (gastroesophageal reflux disease)   . Hypercholesteremia   . Ischemic cardiomyopathy    Echo 06/18/2015 EF 35-40% after lateral MI -> repeat echo 08/06/2015: EF 40-45% with basal-mid inferolateral akinesis and hypokinesis of the lateral wall.  . NSTEMI (non-ST elevated myocardial infarction) ([place]) 11/23/12   NSTEMI 11/23/2012 RCA lesion - PCI with DES; moderate LAD disease  . PTSD (post-traumatic stress disorder)    With Depression - since MI   Past Surgical History:  Procedure Laterality Date  . APPENDECTOMY  1968-70  . CARDIAC CATHETERIZATION N/A 06/16/2015   Procedure: Left Heart Cath and Coronary Angiography;  Surgeon: Jettie Booze, MD;  Location: [place] [facility];  Service: Cardiovascular: 99% thrombotic dCx -> asp thrombectomy & PCI. RCA Stents Patent.   Marland Kitchen CARDIAC CATHETERIZATION N/A 06/16/2015   Procedure: Coronary Stent Intervention;  Surgeon: Jettie Booze, MD;  Location: [place] [facility];  Service: Cardiovascular: PCI dCx = thrombectomy -> 3.0 x 15 Xience drug-eluting stent (3.6 mm)   . COLONOSCOPY  2008   [id number] diverticula, diminutive polyp in the cecum, s/p bx Normal rectum. Tubular adenoma  . COLONOSCOPY N/A 10/17/2012   Dr. Gala Romney: colonic diverticulosis, tubular adenoma, surveillance due May 2019  . COLONOSCOPY N/A 02/21/2018   Procedure: COLONOSCOPY;  Surgeon: Daneil Dolin, MD;  [facility];  Service: Endoscopy;  Laterality: N/A;  12:00  . CORONARY STENT PLACEMENT  11/23/12   Mid RCA -- Xience eXp - 2.75 mm x 18 mm DES (3.65mm) , Dr. Ellyn Hack  . LEFT HEART CATHETERIZATION WITH CORONARY ANGIOGRAM N/A 11/23/2012   Procedure: LEFT HEART CATHETERIZATION WITH CORONARY ANGIOGRAM;  Surgeon: Leonie Man, MD;  Location: [facility];  Service: Cardiovascular: NSTEMI: Culprit = mid RCA 95% & 75%; LAD ~40-50%, Small RI ~60%; EF ~60%, mild inf-basal HK     . TRANSTHORACIC ECHOCARDIOGRAM  January 2017; March 2017  a. EF 35-40%. Entire inferior hypokinesis and akinesis of the basal and mid inferolateral and anterolateral /distal lateral walls;; b. EF 40 and 45%. Akinesis of basal-mid inferolateral wall. Hypokinesis of entire lateral wall.     SOCIAL HISTORY:  Social History   Socioeconomic History  . Marital status: Married    Spouse name: Not on file  . Number of children: Not on file  . Years of education: Not on file  . Highest education level: Not on file   Occupational History  . Not on file  Social Needs  . Financial resource strain: Not on file  . Food insecurity:    Worry: Not on file    Inability: Not on file  . Transportation needs:    Medical: Not on file    Non-medical: Not on file  Tobacco Use  . Smoking status: Former Smoker    Packs/day: 0.25    Years: 2.00    Pack years: 0.50    Types: Cigarettes    Last attempt to quit: 06/07/1963    Years since quitting: 54.9  . Smokeless tobacco: Never Used  Substance and Sexual Activity  . Alcohol use: No    Alcohol/week: 0.0 standard drinks    Comment: Occasionally drinks red wine  . Drug use: No  . Sexual activity: Not on file  Lifestyle  . Physical activity:    Days per week: Not on file    Minutes per session: Not on file  . Stress: Not on file  Relationships  . Social connections:    Talks on phone: Not on file    Gets together: Not on file    Attends religious service: Not on file    Active member of club or organization: Not on file    Attends meetings of clubs or organizations: Not on file    Relationship status: Not on file  . Intimate partner violence:    Fear of current or ex partner: Not on file    Emotionally abused: Not on file    Physically abused: Not on file    Forced sexual activity: Not on file  Other Topics Concern  . Not on file  Social History Narrative   Currently in Cardiac Rehabilitation   "former smoker". Does not drink EtOH   Married.    FAMILY HISTORY:  Family History  Problem Relation Age of Onset  . Diabetes Mother   . Cancer Father   . Colon cancer Neg Hx     CURRENT MEDICATIONS:  Outpatient Encounter Medications as of 05/14/2018  Medication Sig  . cholecalciferol (VITAMIN D) 1000 units tablet Take 1,000 Units by mouth once a week.  . clopidogrel (PLAVIX) 75 MG tablet TAKE 1 TABLET BY MOUTH DAILY.  Marland Kitchen lisinopril (PRINIVIL,ZESTRIL) 2.5 MG tablet Take 1 tablet (2.5 mg total) by mouth daily.  . pantoprazole (PROTONIX) 40 MG tablet  Take one tab 30 minutes before a mea once to twice daily  . simvastatin (ZOCOR) 40 MG tablet TAKE 1 TABLET (40 MG TOTAL) BY MOUTH EVERY OTHER DAY.  . nitroGLYCERIN (NITROSTAT) 0.4 MG SL tablet Place 1 tablet (0.4 mg total) under the tongue every 5 (five) minutes as needed for chest pain. (Patient not taking: Reported on 05/14/2018)   No facility-administered encounter medications on file as of 05/14/2018.     ALLERGIES:  No Known Allergies   PHYSICAL EXAM:  ECOG Performance status: 1  Vitals:   05/14/18 1139  BP: (!) 136/59  Pulse: (!) 59  Resp:  18  Temp: 97.6 F (36.4 C)  SpO2: 99%   Filed Weights   05/14/18 1139  Weight: 159 lb (72.1 kg)    Physical Exam  Constitutional: He is oriented to person, place, and time. He appears well-developed and well-nourished.  Musculoskeletal: Normal range of motion.  Neurological: He is alert and oriented to person, place, and time.  Skin: Skin is warm and dry.  Psychiatric: He has a normal mood and affect. His behavior is normal. Judgment and thought content normal.  Lymphadenopathy: There is a lymphadenopathy measuring 1 cm in the posterior triangles of the neck.  Very mild adenopathy in the axillary regions.  No splenomegaly palpable.  No hepatomegaly.   LABORATORY DATA:  I have reviewed the labs as listed.  CBC    Component Value Date/Time   WBC 44.8 (H) 05/11/2018 1111   RBC 4.44 05/11/2018 1111   HGB 12.9 (L) 05/11/2018 1111   HCT 44.3 05/11/2018 1111   PLT 168 05/11/2018 1111   MCV 99.8 05/11/2018 1111   MCH 29.1 05/11/2018 1111   MCHC 29.1 (L) 05/11/2018 1111   RDW 14.6 05/11/2018 1111   LYMPHSABS 40.9 (H) 05/11/2018 1111   MONOABS 0.4 05/11/2018 1111   EOSABS 0.1 05/11/2018 1111   BASOSABS 0.0 05/11/2018 1111   CMP Latest Ref Rng & Units 05/11/2018 12/21/2017 09/11/2017  Glucose 70 - 99 mg/dL 102(H) 108(H) 110(H)  BUN 8 - 23 mg/dL 14 19 33(H)  Creatinine 0.61 - 1.24 mg/dL 1.35(H) 1.40(H) 1.58(H)  Sodium 135 - 145  mmol/L 141 143 136  Potassium 3.5 - 5.1 mmol/L 4.5 4.1 3.7  Chloride 98 - 111 mmol/L 110 110 103  CO2 22 - 32 mmol/L 26 27 23   Calcium 8.9 - 10.3 mg/dL 9.1 9.3 9.0  Total Protein 6.5 - 8.1 g/dL 6.9 7.0 7.1  Total Bilirubin 0.3 - 1.2 mg/dL 0.7 1.3(H) 1.4(H)  Alkaline Phos 38 - 126 U/L 47 45 46  AST 15 - 41 U/L 23 23 34  ALT 0 - 44 U/L 11 13 17      I have reviewed Francene Finders, NP's note and agree with the documentation.  I personally performed a face-to-face visit, made revisions and my assessment and plan is as follows.      ASSESSMENT & PLAN:   CLL (chronic lymphocytic leukemia) ([place]) 1.  Stage I CLL: -Originally diagnosed in 2008, on watchful waiting since then. - Denies any fevers, night sweats or weight loss.  Denies any recurrent infections or hospitalizations. -Today's examination reveals lymphadenopathy in the posterior triangle of the neck, axillary region.  No palpable splenomegaly. -We have discussed the results of his blood work with stable white count and lymphocyte count.  No cytopenias were noted. -he will come back in 6 months for follow-up with repeat blood work and physical exam.  2.  CKD: -He has mild CKD with creatinine around 1.5.  This is stable.      Orders placed this encounter:  Orders Placed This Encounter  Procedures  . Lactate dehydrogenase  . CBC with Differential/Platelet  . Comprehensive metabolic panel      Derek Jack, MD [place] [phone]

## 2018-05-14 NOTE — Assessment & Plan Note (Signed)
1.  Stage I CLL: -Originally diagnosed in 2008, on watchful waiting since then. - Denies any fevers, night sweats or weight loss.  Denies any recurrent infections or hospitalizations. -Today's examination reveals lymphadenopathy in the posterior triangle of the neck, axillary region.  No palpable splenomegaly. -We have discussed the results of his blood work with stable white count and lymphocyte count.  No cytopenias were noted. -he will come back in 6 months for follow-up with repeat blood work and physical exam.  2.  CKD: -He has mild CKD with creatinine around 1.5.  This is stable.

## 2018-06-20 ENCOUNTER — Ambulatory Visit: Payer: Medicare HMO | Admitting: Nurse Practitioner

## 2018-08-15 ENCOUNTER — Other Ambulatory Visit: Payer: Self-pay | Admitting: Cardiology

## 2018-10-02 ENCOUNTER — Ambulatory Visit (INDEPENDENT_AMBULATORY_CARE_PROVIDER_SITE_OTHER): Payer: Medicare HMO | Admitting: Nurse Practitioner

## 2018-10-14 ENCOUNTER — Other Ambulatory Visit: Payer: Self-pay | Admitting: Gastroenterology

## 2018-11-13 ENCOUNTER — Other Ambulatory Visit (HOSPITAL_COMMUNITY): Payer: Medicare HMO

## 2018-11-14 ENCOUNTER — Inpatient Hospital Stay (HOSPITAL_COMMUNITY): Payer: Medicare HMO | Attending: Nurse Practitioner

## 2018-11-14 ENCOUNTER — Other Ambulatory Visit: Payer: Self-pay

## 2018-11-14 DIAGNOSIS — M7989 Other specified soft tissue disorders: Secondary | ICD-10-CM | POA: Insufficient documentation

## 2018-11-14 DIAGNOSIS — K5900 Constipation, unspecified: Secondary | ICD-10-CM | POA: Insufficient documentation

## 2018-11-14 DIAGNOSIS — C9110 Chronic lymphocytic leukemia of B-cell type not having achieved remission: Secondary | ICD-10-CM | POA: Insufficient documentation

## 2018-11-14 DIAGNOSIS — Z87891 Personal history of nicotine dependence: Secondary | ICD-10-CM | POA: Insufficient documentation

## 2018-11-14 DIAGNOSIS — N189 Chronic kidney disease, unspecified: Secondary | ICD-10-CM | POA: Insufficient documentation

## 2018-11-14 LAB — CBC WITH DIFFERENTIAL/PLATELET
Abs Immature Granulocytes: 0.05 10*3/uL (ref 0.00–0.07)
Basophils Absolute: 0.1 10*3/uL (ref 0.0–0.1)
Basophils Relative: 0 %
Eosinophils Absolute: 0.1 10*3/uL (ref 0.0–0.5)
Eosinophils Relative: 0 %
HCT: 42.3 % (ref 39.0–52.0)
Hemoglobin: 12.6 g/dL — ABNORMAL LOW (ref 13.0–17.0)
Immature Granulocytes: 0 %
Lymphocytes Relative: 92 %
Lymphs Abs: 40.7 10*3/uL — ABNORMAL HIGH (ref 0.7–4.0)
MCH: 29.8 pg (ref 26.0–34.0)
MCHC: 29.8 g/dL — ABNORMAL LOW (ref 30.0–36.0)
MCV: 100 fL (ref 80.0–100.0)
Monocytes Absolute: 0.4 10*3/uL (ref 0.1–1.0)
Monocytes Relative: 1 %
Neutro Abs: 3.1 10*3/uL (ref 1.7–7.7)
Neutrophils Relative %: 7 %
Platelets: 173 10*3/uL (ref 150–400)
RBC: 4.23 MIL/uL (ref 4.22–5.81)
RDW: 14.2 % (ref 11.5–15.5)
WBC: 44.4 10*3/uL — ABNORMAL HIGH (ref 4.0–10.5)
nRBC: 0 % (ref 0.0–0.2)

## 2018-11-14 LAB — LACTATE DEHYDROGENASE: LDH: 130 U/L (ref 98–192)

## 2018-11-14 LAB — COMPREHENSIVE METABOLIC PANEL
ALT: 13 U/L (ref 0–44)
AST: 24 U/L (ref 15–41)
Albumin: 4.1 g/dL (ref 3.5–5.0)
Alkaline Phosphatase: 40 U/L (ref 38–126)
Anion gap: 7 (ref 5–15)
BUN: 17 mg/dL (ref 8–23)
CO2: 25 mmol/L (ref 22–32)
Calcium: 9.1 mg/dL (ref 8.9–10.3)
Chloride: 108 mmol/L (ref 98–111)
Creatinine, Ser: 1.39 mg/dL — ABNORMAL HIGH (ref 0.61–1.24)
GFR calc Af Amer: 57 mL/min — ABNORMAL LOW (ref >60)
GFR calc non Af Amer: 50 mL/min — ABNORMAL LOW (ref >60)
Glucose, Bld: 120 mg/dL — ABNORMAL HIGH (ref 70–99)
Potassium: 4.1 mmol/L (ref 3.5–5.1)
Sodium: 140 mmol/L (ref 135–145)
Total Bilirubin: 1 mg/dL (ref 0.3–1.2)
Total Protein: 6.9 g/dL (ref 6.5–8.1)

## 2018-11-19 ENCOUNTER — Other Ambulatory Visit: Payer: Self-pay

## 2018-11-20 ENCOUNTER — Encounter (HOSPITAL_COMMUNITY): Payer: Self-pay | Admitting: Hematology

## 2018-11-20 ENCOUNTER — Inpatient Hospital Stay (HOSPITAL_COMMUNITY): Payer: Medicare HMO | Admitting: Hematology

## 2018-11-20 DIAGNOSIS — K5900 Constipation, unspecified: Secondary | ICD-10-CM

## 2018-11-20 DIAGNOSIS — N189 Chronic kidney disease, unspecified: Secondary | ICD-10-CM | POA: Diagnosis not present

## 2018-11-20 DIAGNOSIS — M7989 Other specified soft tissue disorders: Secondary | ICD-10-CM | POA: Diagnosis not present

## 2018-11-20 DIAGNOSIS — C9110 Chronic lymphocytic leukemia of B-cell type not having achieved remission: Secondary | ICD-10-CM | POA: Diagnosis not present

## 2018-11-20 DIAGNOSIS — Z87891 Personal history of nicotine dependence: Secondary | ICD-10-CM

## 2018-11-20 NOTE — Progress Notes (Signed)
[address] [place], [place] [address]   CLINIC:  Medical Oncology/Hematology  PCP:  [name], MD [place] [address] [phone]   REASON FOR VISIT: Follow-up for Chronic lymphocytic leukemia CLL  CURRENT THERAPY: Observation    INTERVAL HISTORY:  [name] 73 y.o. male comes back for follow-up of CLL.  His appetite is 100%.  Energy levels are 75%.  No pain reported.  He continues to have leg swellings.  He has constipation which is well controlled.  Denies any recent infections or hospitalizations.  Denies any fevers, night sweats or weight loss.  Denies any painful lymphadenopathy.  No nausea, vomiting or diarrhea was reported.  No bleeding episodes.  REVIEW OF SYSTEMS:  Review of Systems  Cardiovascular: Positive for leg swelling.  Gastrointestinal: Positive for constipation.  All other systems reviewed and are negative.    PAST MEDICAL/SURGICAL HISTORY:  Past Medical History:  Diagnosis Date  . Acute MI, inferolateral wall, initial episode of care ([place]) 06/16/15   99% dCx   . BPH (benign prostatic hyperplasia) 04/08/2011  . CAD S/P percutaneous coronary angioplasty 11/23/12; 06/16/15   a. 2 overlapping mRCA lesions - Xience Xpedition DES 2.75 mm x 18 mm (3.0 mm);; b. Inf-Lat STEMI 06/16/2015 - dCx Asp Thrombectomy & PCI 3.0 x 15 Xience drDES (3.6 mm)  . Chronic headache   . CLL (chronic lymphocytic leukemia) ([place]) 04/08/2011  . Depression   . Diverticulitis   . Gallstone   . GERD (gastroesophageal reflux disease)   . Hypercholesteremia   . Ischemic cardiomyopathy    Echo 06/18/2015 EF 35-40% after lateral MI -> repeat echo 08/06/2015: EF 40-45% with basal-mid inferolateral akinesis and hypokinesis of the lateral wall.  . NSTEMI (non-ST elevated myocardial infarction) ([place]) 11/23/12   NSTEMI 11/23/2012 RCA lesion - PCI with DES; moderate LAD disease  . PTSD (post-traumatic stress disorder)    With Depression - since  MI   Past Surgical History:  Procedure Laterality Date  . APPENDECTOMY  1968-70  . CARDIAC CATHETERIZATION N/A 06/16/2015   Procedure: Left Heart Cath and Coronary Angiography;  Surgeon: Jettie Booze, MD;  Location: [facility];  Service: Cardiovascular: 99% thrombotic dCx -> asp thrombectomy & PCI. RCA Stents Patent.   Marland Kitchen CARDIAC CATHETERIZATION N/A 06/16/2015   Procedure: Coronary Stent Intervention;  Surgeon: Jettie Booze, MD;  Location: [facility];  Service: Cardiovascular: PCI dCx = thrombectomy -> 3.0 x 15 Xience drug-eluting stent (3.6 mm)   . COLONOSCOPY  2008   URK:YHCW-CBJSE diverticula, diminutive polyp in the cecum, s/p bx Normal rectum. Tubular adenoma  . COLONOSCOPY N/A 10/17/2012   Dr. Gala Romney: colonic diverticulosis, tubular adenoma, surveillance due May 2019  . COLONOSCOPY N/A 02/21/2018   Procedure: COLONOSCOPY;  Surgeon: Daneil Dolin, MD;  [facility];  Service: Endoscopy;  Laterality: N/A;  12:00  . CORONARY STENT PLACEMENT  11/23/12   Mid RCA -- Xience eXp - 2.75 mm x 18 mm DES (3.63mm) , Dr. Ellyn Hack  . LEFT HEART CATHETERIZATION WITH CORONARY ANGIOGRAM N/A 11/23/2012   Procedure: LEFT HEART CATHETERIZATION WITH CORONARY ANGIOGRAM;  Surgeon: Leonie Man, MD;  Location: [facility];  Service: Cardiovascular: NSTEMI: Culprit = mid RCA 95% & 75%; LAD ~40-50%, Small RI ~60%; EF ~60%, mild inf-basal HK     . TRANSTHORACIC ECHOCARDIOGRAM  January 2017; March 2017   a. EF 35-40%. Entire inferior hypokinesis and akinesis of the  basal and mid inferolateral and anterolateral /distal lateral walls;; b. EF 40 and 45%. Akinesis of basal-mid inferolateral wall. Hypokinesis of entire lateral wall.     SOCIAL HISTORY:  Social History   Socioeconomic History  . Marital status: Married    Spouse name: Not on file  . Number of children: Not on file  . Years of education: Not on file  . Highest education level: Not on file  Occupational History    . Not on file  Social Needs  . Financial resource strain: Not on file  . Food insecurity    Worry: Not on file    Inability: Not on file  . Transportation needs    Medical: Not on file    Non-medical: Not on file  Tobacco Use  . Smoking status: Former Smoker    Packs/day: 0.25    Years: 2.00    Pack years: 0.50    Types: Cigarettes    Quit date: 06/07/1963    Years since quitting: 55.4  . Smokeless tobacco: Never Used  Substance and Sexual Activity  . Alcohol use: No    Alcohol/week: 0.0 standard drinks    Comment: Occasionally drinks red wine  . Drug use: No  . Sexual activity: Not on file  Lifestyle  . Physical activity    Days per week: Not on file    Minutes per session: Not on file  . Stress: Not on file  Relationships  . Social Herbalist on phone: Not on file    Gets together: Not on file    Attends religious service: Not on file    Active member of club or organization: Not on file    Attends meetings of clubs or organizations: Not on file    Relationship status: Not on file  . Intimate partner violence    Fear of current or ex partner: Not on file    Emotionally abused: Not on file    Physically abused: Not on file    Forced sexual activity: Not on file  Other Topics Concern  . Not on file  Social History Narrative   Currently in Cardiac Rehabilitation   "former smoker". Does not drink EtOH   Married.    FAMILY HISTORY:  Family History  Problem Relation Age of Onset  . Diabetes Mother   . Cancer Father   . Colon cancer Neg Hx     CURRENT MEDICATIONS:  Outpatient Encounter Medications as of 11/20/2018  Medication Sig  . cholecalciferol (VITAMIN D) 1000 units tablet Take 1,000 Units by mouth once a week.  . clopidogrel (PLAVIX) 75 MG tablet TAKE 1 TABLET BY MOUTH DAILY.  Marland Kitchen lisinopril (PRINIVIL,ZESTRIL) 2.5 MG tablet Take 1 tablet (2.5 mg total) by mouth daily.  . pantoprazole (PROTONIX) 40 MG tablet TAKE ONE TABLET BY MOUTH 30 MINUTES  BEFORE A MEAL ONCE OR TWICE DAILY  . simvastatin (ZOCOR) 40 MG tablet Take 1 tablet (40 mg total) by mouth every other day. NEED OV.  . nitroGLYCERIN (NITROSTAT) 0.4 MG SL tablet Place 1 tablet (0.4 mg total) under the tongue every 5 (five) minutes as needed for chest pain. (Patient not taking: Reported on 05/14/2018)   No facility-administered encounter medications on file as of 11/20/2018.     ALLERGIES:  No Known Allergies   PHYSICAL EXAM:  ECOG Performance status: 1  Vitals:   11/20/18 1134  BP: 131/64  Pulse: (!) 57  Resp: 16  Temp: 97.6 F (36.4 C)  SpO2: 100%   Filed Weights   11/20/18 1134  Weight: 152 lb 3.4 oz (69 kg)    Physical Exam Constitutional:      Appearance: Normal appearance. He is well-developed.  Cardiovascular:     Rate and Rhythm: Normal rate and regular rhythm.     Heart sounds: Normal heart sounds.  Pulmonary:     Effort: Pulmonary effort is normal.     Breath sounds: Normal breath sounds.  Abdominal:     General: There is no distension.     Palpations: Abdomen is soft. There is no mass.  Musculoskeletal: Normal range of motion.  Lymphadenopathy:     Cervical: Cervical adenopathy present.  Skin:    General: Skin is warm.  Neurological:     General: No focal deficit present.     Mental Status: He is alert and oriented to person, place, and time.  Psychiatric:        Mood and Affect: Mood normal.        Behavior: Behavior normal.   Lymphadenopathy: There is lymphadenopathy measuring around 1 cm in the posterior triangle of the neck.  Adenopathy in the axillary regions.  No splenomegaly.   LABORATORY DATA:  I have reviewed the labs as listed.  CBC    Component Value Date/Time   WBC 44.4 (H) 11/14/2018 1233   RBC 4.23 11/14/2018 1233   HGB 12.6 (L) 11/14/2018 1233   HCT 42.3 11/14/2018 1233   PLT 173 11/14/2018 1233   MCV 100.0 11/14/2018 1233   MCH 29.8 11/14/2018 1233   MCHC 29.8 (L) 11/14/2018 1233   RDW 14.2 11/14/2018 1233     LYMPHSABS 40.7 (H) 11/14/2018 1233   MONOABS 0.4 11/14/2018 1233   EOSABS 0.1 11/14/2018 1233   BASOSABS 0.1 11/14/2018 1233   CMP Latest Ref Rng & Units 11/14/2018 05/11/2018 12/21/2017  Glucose 70 - 99 mg/dL 120(H) 102(H) 108(H)  BUN 8 - 23 mg/dL 17 14 19   Creatinine 0.61 - 1.24 mg/dL 1.39(H) 1.35(H) 1.40(H)  Sodium 135 - 145 mmol/L 140 141 143  Potassium 3.5 - 5.1 mmol/L 4.1 4.5 4.1  Chloride 98 - 111 mmol/L 108 110 110  CO2 22 - 32 mmol/L 25 26 27   Calcium 8.9 - 10.3 mg/dL 9.1 9.1 9.3  Total Protein 6.5 - 8.1 g/dL 6.9 6.9 7.0  Total Bilirubin 0.3 - 1.2 mg/dL 1.0 0.7 1.3(H)  Alkaline Phos 38 - 126 U/L 40 47 45  AST 15 - 41 U/L 24 23 23   ALT 0 - 44 U/L 13 11 13          ASSESSMENT & PLAN:   CLL (chronic lymphocytic leukemia) (HCC) 1.  Stage I CLL: -Originally diagnosed in 2008, on watchful waiting since then. - Denies any fevers, night sweats or weight loss in the last 6 months.  Denies any recurrent infections or hospitalizations. -Physical exam today reveals stable lymphadenopathy in the posterior triangle of the neck and axillary region.  No palpable splenomegaly. -We have reviewed his blood work.  His hemoglobin is steadily coming down although not significant.  Hemoglobin today is 12.6.  White count is stable around 44.  Platelet count is normal. - I will see him back in 6 months for follow-up.  We will plan to repeat blood work at that time.  He was told to come back sooner if he develops any fevers, night sweats or unexplained weight loss.  Indications for treatment include B symptoms, recurrent infections, painful lymphadenopathy, cytopenias.  2.  CKD: -He has mild CKD with creatinine between 1.3-1.5.  He is also on lisinopril. -Have counseled him to avoid NSAIDs and drink plenty of fluids.  Total time spent is 25 minutes with more than 50% of the time spent face-to-face discussing his disease prognosis, treatment indications and coordination of care.    Orders  placed this encounter:  No orders of the defined types were placed in this encounter.     Derek Jack, MD [place] [phone]

## 2018-11-20 NOTE — Patient Instructions (Addendum)
[facility] at [facility] Discharge Instructions  You were seen today by Dr. Katragadda. He went over your recent lab results. He will see you back in 6 months for labs and follow up.   Thank you for choosing [facility] at [facility] to provide your oncology and hematology care.  To afford each patient quality time with our provider, please arrive at least 15 minutes before your scheduled appointment time.   If you have a lab appointment with the Cancer Center please come in thru the  Main Entrance and check in at the main information desk  You need to re-schedule your appointment should you arrive 10 or more minutes late.  We strive to give you quality time with our providers, and arriving late affects you and other patients whose appointments are after yours.  Also, if you no show three or more times for appointments you may be dismissed from the clinic at the providers discretion.     Again, thank you for choosing [facility].  Our hope is that these requests will decrease the amount of time that you wait before being seen by our physicians.       _____________________________________________________________  Should you have questions after your visit to [facility], please contact our office at [phone] between the hours of 8:00 a.m. and 4:30 p.m.  Voicemails left after 4:00 p.m. will not be returned until the following business day.  For prescription refill requests, have your pharmacy contact our office and allow 72 hours.    Cancer Center Support Programs:   > Cancer Support Group  2nd Tuesday of the month 1pm-2pm, Journey Room    

## 2018-11-20 NOTE — Assessment & Plan Note (Addendum)
1.  Stage I CLL: -Originally diagnosed in 2008, on watchful waiting since then. - Denies any fevers, night sweats or weight loss in the last 6 months.  Denies any recurrent infections or hospitalizations. -Physical exam today reveals stable lymphadenopathy in the posterior triangle of the neck and axillary region.  No palpable splenomegaly. -We have reviewed his blood work.  His hemoglobin is steadily coming down although not significant.  Hemoglobin today is 12.6.  White count is stable around 44.  Platelet count is normal. - I will see him back in 6 months for follow-up.  We will plan to repeat blood work at that time.  He was told to come back sooner if he develops any fevers, night sweats or unexplained weight loss.  Indications for treatment include B symptoms, recurrent infections, painful lymphadenopathy, cytopenias.  2.  CKD: -He has mild CKD with creatinine between 1.3-1.5.  He is also on lisinopril. -Have counseled him to avoid NSAIDs and drink plenty of fluids.

## 2018-11-29 ENCOUNTER — Ambulatory Visit
Admission: EM | Admit: 2018-11-29 | Discharge: 2018-11-29 | Disposition: A | Discharge status: Home / Self Care | Payer: Medicare HMO | Attending: Emergency Medicine | Admitting: Emergency Medicine

## 2018-11-29 ENCOUNTER — Other Ambulatory Visit: Payer: Self-pay

## 2018-11-29 DIAGNOSIS — M25571 Pain in right ankle and joints of right foot: Secondary | ICD-10-CM

## 2018-11-29 MED ORDER — PREDNISONE 20 MG PO TABS: 20.0000 mg | ORAL_TABLET | Freq: Two times a day (BID) | ORAL | 0 refills | Status: AC

## 2018-11-29 NOTE — Discharge Instructions (Signed)
Offered ankle x-ray.  Would like to hold off today.  Will try outpatient therapy first and return if symptoms do not improve.   Continue conservative management of rest, ice, and elevation Ace bandage applied Prednisone prescribed.  Take as directed and to completion.  Return or follow up with PCP if symptoms persist Return or go to the ER if you have any new or worsening symptoms (fever, chills, skin changes, increased swelling, worsening pain despite treatments, etc...)

## 2018-11-29 NOTE — ED Triage Notes (Signed)
Pt states right foot become painful about 3 hours ago, denies injury

## 2018-11-29 NOTE — ED Provider Notes (Signed)
[place]   [id number] 11/29/18 Arrival Time: 2585  CC: Right ankle pain  SUBJECTIVE: History from: patient. [name] is a 73 y.o. male complains of right ankle pain that began 3 hours ago.  Denies a precipitating event or specific injury.  Localizes the pain to the outside of right ankle.  Describes the pain as intermittent and achy in character.  Has tried OTC icy/hot with relief.  Symptoms are made worse with walking for a long period of time.  Denies similar symptoms in the past.  Complains of chronic lower extremity swelling and spider veins.  Denies fever, chills, erythema, ecchymosis, effusion, weakness, numbness and tingling.  ROS: As per HPI.  Past Medical History:  Diagnosis Date  . Acute MI, inferolateral wall, initial episode of care ([place]) 06/16/15   99% dCx   . BPH (benign prostatic hyperplasia) 04/08/2011  . CAD S/P percutaneous coronary angioplasty 11/23/12; 06/16/15   a. 2 overlapping mRCA lesions - Xience Xpedition DES 2.75 mm x 18 mm (3.0 mm);; b. Inf-Lat STEMI 06/16/2015 - dCx Asp Thrombectomy & PCI 3.0 x 15 Xience drDES (3.6 mm)  . Chronic headache   . CLL (chronic lymphocytic leukemia) ([place]) 04/08/2011  . Depression   . Diverticulitis   . Gallstone   . GERD (gastroesophageal reflux disease)   . Hypercholesteremia   . Ischemic cardiomyopathy    Echo 06/18/2015 EF 35-40% after lateral MI -> repeat echo 08/06/2015: EF 40-45% with basal-mid inferolateral akinesis and hypokinesis of the lateral wall.  . NSTEMI (non-ST elevated myocardial infarction) ([place]) 11/23/12   NSTEMI 11/23/2012 RCA lesion - PCI with DES; moderate LAD disease  . PTSD (post-traumatic stress disorder)    With Depression - since MI   Past Surgical History:  Procedure Laterality Date  . APPENDECTOMY  1968-70  . CARDIAC CATHETERIZATION N/A 06/16/2015   Procedure: Left Heart Cath and Coronary Angiography;  Surgeon: Jettie Booze, MD;  Location: [facility];  Service:  Cardiovascular: 99% thrombotic dCx -> asp thrombectomy & PCI. RCA Stents Patent.   Marland Kitchen CARDIAC CATHETERIZATION N/A 06/16/2015   Procedure: Coronary Stent Intervention;  Surgeon: Jettie Booze, MD;  Location: [facility];  Service: Cardiovascular: PCI dCx = thrombectomy -> 3.0 x 15 Xience drug-eluting stent (3.6 mm)   . COLONOSCOPY  2008   IDP:OEUM-PNTIR diverticula, diminutive polyp in the cecum, s/p bx Normal rectum. Tubular adenoma  . COLONOSCOPY N/A 10/17/2012   Dr. Gala Romney: colonic diverticulosis, tubular adenoma, surveillance due May 2019  . COLONOSCOPY N/A 02/21/2018   Procedure: COLONOSCOPY;  Surgeon: Daneil Dolin, MD;  [facility];  Service: Endoscopy;  Laterality: N/A;  12:00  . CORONARY STENT PLACEMENT  11/23/12   Mid RCA -- Xience eXp - 2.75 mm x 18 mm DES (3.61mm) , Dr. Ellyn Hack  . LEFT HEART CATHETERIZATION WITH CORONARY ANGIOGRAM N/A 11/23/2012   Procedure: LEFT HEART CATHETERIZATION WITH CORONARY ANGIOGRAM;  Surgeon: Leonie Man, MD;  Location: [facility] CATH LAB;  Service: Cardiovascular: NSTEMI: Culprit = mid RCA 95% & 75%; LAD ~40-50%, Small RI ~60%; EF ~60%, mild inf-basal HK     . TRANSTHORACIC ECHOCARDIOGRAM  January 2017; March 2017   a. EF 35-40%. Entire inferior hypokinesis and akinesis of the basal and mid inferolateral and anterolateral /distal lateral walls;; b. EF 40 and 45%. Akinesis of basal-mid inferolateral wall. Hypokinesis of entire lateral wall.   No Known Allergies No current facility-administered medications on file prior to encounter.  Current Outpatient Medications on File Prior to Encounter  Medication Sig Dispense Refill  . cholecalciferol (VITAMIN D) 1000 units tablet Take 1,000 Units by mouth once a week.    . clopidogrel (PLAVIX) 75 MG tablet TAKE 1 TABLET BY MOUTH DAILY. 90 tablet 3  . lisinopril (PRINIVIL,ZESTRIL) 2.5 MG tablet Take 1 tablet (2.5 mg total) by mouth daily. 90 tablet 3  . pantoprazole (PROTONIX) 40 MG tablet TAKE ONE  TABLET BY MOUTH 30 MINUTES BEFORE A MEAL ONCE OR TWICE DAILY 60 tablet 5  . simvastatin (ZOCOR) 40 MG tablet Take 1 tablet (40 mg total) by mouth every other day. NEED OV. 15 tablet 0  . [DISCONTINUED] nitroGLYCERIN (NITROSTAT) 0.4 MG SL tablet Place 1 tablet (0.4 mg total) under the tongue every 5 (five) minutes as needed for chest pain. (Patient not taking: Reported on 05/14/2018) 25 tablet 3   Social History   Socioeconomic History  . Marital status: Married    Spouse name: Not on file  . Number of children: Not on file  . Years of education: Not on file  . Highest education level: Not on file  Occupational History  . Not on file  Social Needs  . Financial resource strain: Not on file  . Food insecurity    Worry: Not on file    Inability: Not on file  . Transportation needs    Medical: Not on file    Non-medical: Not on file  Tobacco Use  . Smoking status: Former Smoker    Packs/day: 0.25    Years: 2.00    Pack years: 0.50    Types: Cigarettes    Quit date: 06/07/1963    Years since quitting: 55.5  . Smokeless tobacco: Never Used  Substance and Sexual Activity  . Alcohol use: No    Alcohol/week: 0.0 standard drinks    Comment: Occasionally drinks red wine  . Drug use: No  . Sexual activity: Not on file  Lifestyle  . Physical activity    Days per week: Not on file    Minutes per session: Not on file  . Stress: Not on file  Relationships  . Social Herbalist on phone: Not on file    Gets together: Not on file    Attends religious service: Not on file    Active member of club or organization: Not on file    Attends meetings of clubs or organizations: Not on file    Relationship status: Not on file  . Intimate partner violence    Fear of current or ex partner: Not on file    Emotionally abused: Not on file    Physically abused: Not on file    Forced sexual activity: Not on file  Other Topics Concern  . Not on file  Social History Narrative   Currently  in Cardiac Rehabilitation   "former smoker". Does not drink EtOH   Married.   Family History  Problem Relation Age of Onset  . Diabetes Mother   . Cancer Father   . Colon cancer Neg Hx     OBJECTIVE:  Vitals:   11/29/18 1653  BP: 127/70  Pulse: (!) 54  Resp: 18  Temp: 98.2 F (36.8 C)  SpO2: 96%    General appearance: ALERT; in no acute distress.  Head: NCAT Lungs: Normal respiratory effort CV: Dorsalis pedis pulses 2+ bilaterally. Cap refill < 2 seconds Musculoskeletal: Right ankle Inspection: Spider veins and lower extremity edema 1-2+ bilaterally Palpation:  Mildly TTP over lateral ankle ROM: FROM active and passive Strength: 5/5 dorsiflexion, 5/5 plantar flexion Skin: warm and dry Neurologic: Ambulates without difficulty; Sensation intact about the lower extremities Psychological: alert and cooperative; normal mood and affect   ASSESSMENT & PLAN:  1. Acute right ankle pain     Meds ordered this encounter  Medications  . predniSONE (DELTASONE) 20 MG tablet    Sig: Take 1 tablet (20 mg total) by mouth 2 (two) times daily with a meal for 5 days.    Dispense:  10 tablet    Refill:  0    Order Specific Question:   Supervising Provider    Answer:   Raylene Everts [[id number]]   Offered ankle x-ray.  Would like to hold off today.  Will try outpatient therapy first and return if symptoms do not improve.   Continue conservative management of rest, ice, and elevation Ace bandage applied Prednisone prescribed.  Take as directed and to completion.  Return or follow up with PCP if symptoms persist Return or go to the ER if you have any new or worsening symptoms (fever, chills, skin changes, increased swelling, worsening pain despite treatments, etc...)   Reviewed expectations re: course of current medical issues. Questions answered. Outlined signs and symptoms indicating need for more acute intervention. Patient verbalized understanding. After Visit Summary given.     Lestine Box, PA-C 11/29/18 1821

## 2019-01-07 ENCOUNTER — Other Ambulatory Visit: Payer: Self-pay | Admitting: Cardiology

## 2019-01-27 ENCOUNTER — Other Ambulatory Visit: Payer: Self-pay | Admitting: Cardiology

## 2019-02-05 ENCOUNTER — Ambulatory Visit (INDEPENDENT_AMBULATORY_CARE_PROVIDER_SITE_OTHER): Payer: Medicare HMO | Admitting: Internal Medicine

## 2019-02-09 ENCOUNTER — Other Ambulatory Visit (INDEPENDENT_AMBULATORY_CARE_PROVIDER_SITE_OTHER): Payer: Self-pay | Admitting: Internal Medicine

## 2019-02-21 ENCOUNTER — Encounter (INDEPENDENT_AMBULATORY_CARE_PROVIDER_SITE_OTHER): Payer: Self-pay | Admitting: Internal Medicine

## 2019-02-21 ENCOUNTER — Ambulatory Visit (INDEPENDENT_AMBULATORY_CARE_PROVIDER_SITE_OTHER): Payer: Medicare HMO | Admitting: Internal Medicine

## 2019-02-21 ENCOUNTER — Other Ambulatory Visit: Payer: Self-pay

## 2019-02-21 VITALS — BP 130/60 | HR 60 | Ht 69.0 in | Wt 155.8 lb

## 2019-02-21 DIAGNOSIS — R600 Localized edema: Secondary | ICD-10-CM

## 2019-02-21 DIAGNOSIS — I10 Essential (primary) hypertension: Secondary | ICD-10-CM

## 2019-02-21 NOTE — Progress Notes (Signed)
Wellness Office Visit  Subjective:  Patient ID: BIRCH SCHETTER, male    DOB: [birth year]  Age: 73 y.o. MRN: [id number]  CC: This man comes in for follow-up of leg edema and hypertension. HPI  He was seen by Judson Roch approximately 6 weeks ago and she had started him on Lasix 20 mg daily.  He tells me that the leg edema has improved but tends to be worse during the latter part of the day.  He denies any dyspnea, PND or orthopnea. Past Medical History:  Diagnosis Date  . Acute MI, inferolateral wall, initial episode of care ([place]) 06/16/15   99% dCx   . BPH (benign prostatic hyperplasia) 04/08/2011  . CAD S/P percutaneous coronary angioplasty 11/23/12; 06/16/15   a. 2 overlapping mRCA lesions - Xience Xpedition DES 2.75 mm x 18 mm (3.0 mm);; b. Inf-Lat STEMI 06/16/2015 - dCx Asp Thrombectomy & PCI 3.0 x 15 Xience drDES (3.6 mm)  . Chronic headache   . CLL (chronic lymphocytic leukemia) ([place]) 04/08/2011  . Depression   . Diverticulitis   . Gallstone   . GERD (gastroesophageal reflux disease)   . Hypercholesteremia   . Ischemic cardiomyopathy    Echo 06/18/2015 EF 35-40% after lateral MI -> repeat echo 08/06/2015: EF 40-45% with basal-mid inferolateral akinesis and hypokinesis of the lateral wall.  . NSTEMI (non-ST elevated myocardial infarction) ([place]) 11/23/12   NSTEMI 11/23/2012 RCA lesion - PCI with DES; moderate LAD disease  . PTSD (post-traumatic stress disorder)    With Depression - since MI      Family History  Problem Relation Age of Onset  . Diabetes Mother   . Cancer Father   . Colon cancer Neg Hx     Social History   Social History Narrative   Currently in Cardiac Rehabilitation   "former smoker". Does not drink EtOH   Married.     Current Meds  Medication Sig  . aspirin EC 81 MG tablet Take 81 mg by mouth daily.  . cholecalciferol (VITAMIN D) 1000 units tablet Take 1,000 Units by mouth once a week.  . clopidogrel (PLAVIX) 75 MG tablet Take 1 tablet (75 mg total) by  mouth daily. OV NEEDED  . furosemide (LASIX) 20 MG tablet Take 20 mg by mouth daily.  Marland Kitchen lisinopril (ZESTRIL) 2.5 MG tablet TAKE 1 TABLET BY MOUTH EVERY DAY  . pantoprazole (PROTONIX) 40 MG tablet TAKE ONE TABLET BY MOUTH 30 MINUTES BEFORE A MEAL ONCE OR TWICE DAILY  . simvastatin (ZOCOR) 40 MG tablet Take 1 tablet (40 mg total) by mouth every other day. NEED OV.      Objective:   Today's Vitals: BP 130/60   Pulse 60   Ht 5\' 9"  (1.753 m)   Wt 155 lb 12.8 oz (70.7 kg)   BMI 23.01 kg/m  Vitals with BMI 02/21/2019 11/29/2018 11/20/2018  Height 5\' 9"  - -  Weight 155 lbs 13 oz - 152 lbs 3 oz  BMI 23 - -  Systolic [id number]  Diastolic 60 70 64  Pulse 60 54 57     Physical Exam  He looks systemically well.  Blood pressure is reasonable.  There is peripheral pitting edema in his lower legs.     Assessment   1. Benign essential HTN   2. Lower leg edema      Plan: 1. He will continue with all medications. 2. Blood work is ordered as below. 3. I recommended leg elevation 3 times a day, each  time for 20 to 30 minutes and I think this will improve his lower leg edema. 4. He will follow-up in a couple of months with Judson Roch for his chronic conditions.  Tests ordered Orders Placed This Encounter  Procedures  . COMPLETE METABOLIC PANEL WITH GFR     Nimish Luther Parody, MD

## 2019-02-22 ENCOUNTER — Encounter (INDEPENDENT_AMBULATORY_CARE_PROVIDER_SITE_OTHER): Payer: Self-pay | Admitting: Internal Medicine

## 2019-02-22 LAB — COMPLETE METABOLIC PANEL WITH GFR
AG Ratio: 1.8 (calc) (ref 1.0–2.5)
ALT: 8 U/L — ABNORMAL LOW (ref 9–46)
AST: 20 U/L (ref 10–35)
Albumin: 4.1 g/dL (ref 3.6–5.1)
Alkaline phosphatase (APISO): 43 U/L (ref 35–144)
BUN/Creatinine Ratio: 10 (calc) (ref 6–22)
BUN: 13 mg/dL (ref 7–25)
CO2: 29 mmol/L (ref 20–32)
Calcium: 9.5 mg/dL (ref 8.6–10.3)
Chloride: 107 mmol/L (ref 98–110)
Creat: 1.28 mg/dL — ABNORMAL HIGH (ref 0.70–1.18)
GFR, Est African American: 63 mL/min/{1.73_m2} (ref >=60)
GFR, Est Non African American: 55 mL/min/{1.73_m2} — ABNORMAL LOW (ref >=60)
Globulin: 2.3 g/dL (calc) (ref 1.9–3.7)
Glucose, Bld: 130 mg/dL — ABNORMAL HIGH (ref 65–99)
Potassium: 4.2 mmol/L (ref 3.5–5.3)
Sodium: 142 mmol/L (ref 135–146)
Total Bilirubin: 1 mg/dL (ref 0.2–1.2)
Total Protein: 6.4 g/dL (ref 6.1–8.1)

## 2019-03-18 ENCOUNTER — Other Ambulatory Visit: Payer: Self-pay | Admitting: Cardiology

## 2019-03-19 ENCOUNTER — Other Ambulatory Visit: Payer: Self-pay | Admitting: Nurse Practitioner

## 2019-03-26 ENCOUNTER — Other Ambulatory Visit: Payer: Self-pay | Admitting: Cardiology

## 2019-03-26 ENCOUNTER — Telehealth: Payer: Self-pay | Admitting: Cardiology

## 2019-03-26 MED ORDER — CLOPIDOGREL BISULFATE 75 MG PO TABS: 75.0000 mg | ORAL_TABLET | Freq: Once | ORAL | 0 refills | Status: DC

## 2019-03-26 NOTE — Telephone Encounter (Signed)
Yes.. As long as he is coming to see me we can you can refill it  Glenetta Hew, MD

## 2019-03-26 NOTE — Telephone Encounter (Signed)
Left a message with the patient that the prescription has been sent in but he will need to keep his appointment for further refills.

## 2019-03-26 NOTE — Telephone Encounter (Signed)
New message   Patient would like an increase in the amount of his medication to 90 pills and needs a new prescription for clopidogrel (PLAVIX) 75 MG tablet please send to [id number] - [place], [place].

## 2019-04-15 ENCOUNTER — Encounter (INDEPENDENT_AMBULATORY_CARE_PROVIDER_SITE_OTHER): Payer: Self-pay | Admitting: Internal Medicine

## 2019-04-15 ENCOUNTER — Ambulatory Visit (INDEPENDENT_AMBULATORY_CARE_PROVIDER_SITE_OTHER): Payer: Medicare HMO | Admitting: Internal Medicine

## 2019-04-15 ENCOUNTER — Other Ambulatory Visit: Payer: Self-pay

## 2019-04-15 VITALS — BP 160/78 | HR 64 | Ht 69.0 in | Wt 151.6 lb

## 2019-04-15 DIAGNOSIS — I10 Essential (primary) hypertension: Secondary | ICD-10-CM | POA: Diagnosis not present

## 2019-04-15 MED ORDER — FUROSEMIDE 20 MG PO TABS: 20.0000 mg | ORAL_TABLET | Freq: Every day | ORAL | 0 refills | Status: DC

## 2019-04-15 MED ORDER — LISINOPRIL 5 MG PO TABS: 5.0000 mg | ORAL_TABLET | Freq: Every day | ORAL | 0 refills | Status: DC

## 2019-04-15 NOTE — Progress Notes (Signed)
Metrics: Intervention Frequency ACO  Documented Smoking Status Yearly  Screened one or more times in 24 months  Cessation Counseling or  Active cessation medication Past 24 months  Past 24 months   Guideline developer: UpToDate (See UpToDate for funding source) Date Released: 2014       Wellness Office Visit  Subjective:  Patient ID: [name], male    DOB: 05-Nov-1945  Age: 73 y.o. MRN: [id number]  CC: This man comes in for follow-up of hypertension, chronic lymphocytic leukemia, coronary artery disease. HPI  He continues with antihypertensive therapy and is tolerating it well.  He denies any chest pain, dyspnea, palpitations or limb weakness.  He takes furosemide for lower leg edema and takes this every day.  He does have a degree of renal dysfunction but this is stable.  Past Medical History:  Diagnosis Date  . Acute MI, inferolateral wall, initial episode of care ([facility]) 06/16/15   99% dCx   . BPH (benign prostatic hyperplasia) 04/08/2011  . CAD S/P percutaneous coronary angioplasty 11/23/12; 06/16/15   a. 2 overlapping mRCA lesions - Xience Xpedition DES 2.75 mm x 18 mm (3.0 mm);; b. Inf-Lat STEMI 06/16/2015 - dCx Asp Thrombectomy & PCI 3.0 x 15 Xience drDES (3.6 mm)  . Chronic headache   . CLL (chronic lymphocytic leukemia) ([place]) 04/08/2011  . Depression   . Diverticulitis   . Gallstone   . GERD (gastroesophageal reflux disease)   . Hypercholesteremia   . Ischemic cardiomyopathy    Echo 06/18/2015 EF 35-40% after lateral MI -> repeat echo 08/06/2015: EF 40-45% with basal-mid inferolateral akinesis and hypokinesis of the lateral wall.  . NSTEMI (non-ST elevated myocardial infarction) ([facility]) 11/23/12   NSTEMI 11/23/2012 RCA lesion - PCI with DES; moderate LAD disease  . PTSD (post-traumatic stress disorder)    With Depression - since MI      Family History  Problem Relation Age of Onset  . Diabetes Mother   . Cancer Father   . Colon cancer Neg Hx     Social History    Social History Narrative   Currently in Cardiac Rehabilitation   "former smoker". Does not drink EtOH   Married.   Social History   Tobacco Use  . Smoking status: Former Smoker    Packs/day: 0.25    Years: 2.00    Pack years: 0.50    Types: Cigarettes    Quit date: 06/07/1963    Years since quitting: 55.8  . Smokeless tobacco: Never Used  Substance Use Topics  . Alcohol use: No    Alcohol/week: 0.0 standard drinks    Comment: Occasionally drinks red wine    Current Meds  Medication Sig  . aspirin EC 81 MG tablet Take 81 mg by mouth daily.  . cholecalciferol (VITAMIN D) 1000 units tablet Take 1,000 Units by mouth once a week.  . furosemide (LASIX) 20 MG tablet Take 1 tablet (20 mg total) by mouth daily.  Marland Kitchen lisinopril (ZESTRIL) 2.5 MG tablet TAKE 1 TABLET BY MOUTH EVERY DAY  . pantoprazole (PROTONIX) 40 MG tablet TAKE ONE TABLET BY MOUTH 30 MINUTES BEFORE A MEAL ONCE OR TWICE DAILY  . simvastatin (ZOCOR) 40 MG tablet Take 1 tablet (40 mg total) by mouth every other day. NEED OV.  . [DISCONTINUED] furosemide (LASIX) 20 MG tablet Take 20 mg by mouth daily.      Objective:   Today's Vitals: BP (!) 160/78   Pulse 64   Ht 5\' 9"  (1.753 m)  Wt 151 lb 9.6 oz (68.8 kg)   BMI 22.39 kg/m  Vitals with BMI 04/15/2019 02/21/2019 11/29/2018  Height 5\' 9"  5\' 9"  -  Weight 151 lbs 10 oz 155 lbs 13 oz -  BMI [id number] 23 -  Systolic 0000000 [id number]  Diastolic 78 60 70  Pulse 64 60 54     Physical Exam    He looks systemically well but is systolic blood pressure is not controlled.  I am somewhat concerned about this.  He was in a good range on the last visit however.   Assessment   1. Benign essential HTN       Tests ordered No orders of the defined types were placed in this encounter.    Plan: 1. I think we will increase the lisinopril to 5 mg daily and he will double up on the current prescription he has of lisinopril 2.5 mg tablets.  I have sent a new prescription to reflect  the change. 2. I will see him in about a month's time for close monitoring and we will hopefully see his blood pressure improved and we will check blood work at that time also.   Meds ordered this encounter  Medications  . lisinopril (ZESTRIL) 5 MG tablet    Sig: Take 1 tablet (5 mg total) by mouth daily.    Dispense:  90 tablet    Refill:  0  . furosemide (LASIX) 20 MG tablet    Sig: Take 1 tablet (20 mg total) by mouth daily.    Dispense:  90 tablet    Refill:  0    Nimish Luther Parody, MD

## 2019-04-18 ENCOUNTER — Ambulatory Visit (INDEPENDENT_AMBULATORY_CARE_PROVIDER_SITE_OTHER): Payer: Medicare HMO | Admitting: Internal Medicine

## 2019-04-22 ENCOUNTER — Encounter: Payer: Self-pay | Admitting: Cardiology

## 2019-04-22 ENCOUNTER — Other Ambulatory Visit: Payer: Self-pay

## 2019-04-22 ENCOUNTER — Ambulatory Visit: Payer: Medicare HMO | Admitting: Cardiology

## 2019-04-22 VITALS — BP 140/78 | HR 55 | Temp 97.2°F | Ht 69.0 in | Wt 151.0 lb

## 2019-04-22 DIAGNOSIS — E785 Hyperlipidemia, unspecified: Secondary | ICD-10-CM

## 2019-04-22 DIAGNOSIS — R600 Localized edema: Secondary | ICD-10-CM

## 2019-04-22 DIAGNOSIS — I2119 ST elevation (STEMI) myocardial infarction involving other coronary artery of inferior wall: Secondary | ICD-10-CM | POA: Diagnosis not present

## 2019-04-22 DIAGNOSIS — I255 Ischemic cardiomyopathy: Secondary | ICD-10-CM | POA: Diagnosis not present

## 2019-04-22 DIAGNOSIS — Z9861 Coronary angioplasty status: Secondary | ICD-10-CM | POA: Diagnosis not present

## 2019-04-22 DIAGNOSIS — I2510 Atherosclerotic heart disease of native coronary artery without angina pectoris: Secondary | ICD-10-CM

## 2019-04-22 DIAGNOSIS — I10 Essential (primary) hypertension: Secondary | ICD-10-CM | POA: Diagnosis not present

## 2019-04-22 NOTE — Progress Notes (Signed)
Primary Care Provider: [name], MD Cardiologist: No primary care provider on file. Electrophysiologist:   Clinic Note: Chief Complaint  Patient presents with  . Follow-up    2 yr  . Coronary Artery Disease  . Edema    BLE - usually mid-day.     HPI:    [name] is a 72 y.o. male with a PMH notable for CAD, HTN, HLD & CLL who presents today for 2 yr follow-up.   June 2014: Progressive angina symptoms with PCI to the RCA     January 2017 - Inferolateral STEMI in  with significant circumflex disease - PCI LCx.   mildly reduced ejection fraction of 40-45% on follow-up echocardiogram.  [name] was last seen on in November 2018 as a delayed hospital follow-up.  No major complaints.  Had gone back to relatively full level of activity walking 3 to 4 days a week release couple miles at a time.  He also is going to the gym at the fitness center 3 days a week.  No resting or exertional chest pain or pressure.  Recent Hospitalizations:   Most current urgent care November 29, 2018: Right ankle pain.  Patient declined x-ray.  Ace bandage applied, prescribed Prednisone Dosepak.  Told to follow-up PCP  Reviewed  CV studies:    The following studies were reviewed today: (if available, images/films reviewed: From Epic Chart or Care Everywhere) . None:   Interval History:   [name] is a very pleasant elderly gentleman who is quite hard of hearing making it very difficult to visit.  [name] tells me that he is feeling relatively well.  He still does his exercising at the fitness center.  The gym is opened back up again and he is able to go there 3 to 4 days a week now.  He did not go there for little bit in July because of his right ankle that was hurting him.  He was happy to see that the Covid restrictions were loosened so that he could go back to the gym and he is doing his routine activities without any major chest tightness or pressure.  What he does note is that  his heart rate will go up appropriately when he is exerting himself and has no sign of fatigue or exercise intolerance.  He just has to take things, slowly.  He does have some orthostatic dizziness symptoms usually in the morning when he first is out of bed.  No bleeding issues on Plavix.  He has a new PCP - Gosrani, Nimish C, MD, and is due to see him soon.  Hopefully we can get labs checked at that point or if not earlier.  CV Review of Symptoms (Summary) positive for - edema and Mild exertional dyspnea if he overdoes it, some orthostatic dizziness but no passout spells.  Bilateral lower extremity swelling with varicose and spider veins. negative for - chest pain, irregular heartbeat, orthopnea, palpitations, paroxysmal nocturnal dyspnea, rapid heart rate, shortness of breath or Syncope/near syncope, TIA/amaurosis fugax, claudication.  The patient does not have symptoms concerning for COVID-19 infection (fever, chills, cough, or new shortness of breath).  The patient is practicing social distancing. ++ Masking.  Rarely goes Groceries/shopping.    REVIEWED OF SYSTEMS   A comprehensive ROS was performed. Review of Systems  Constitutional: Negative for chills, fever and malaise/fatigue (Does not have great energy, but would not consider himself fatigue.).  HENT: Positive for hearing loss. Negative for  congestion and nosebleeds.   Respiratory: Negative for cough and shortness of breath.   Cardiovascular: Positive for leg swelling (Bilateral lower leg swelling).  Gastrointestinal: Negative for abdominal pain, blood in stool, heartburn and melena.  Genitourinary: Negative for hematuria.  Musculoskeletal: Positive for joint pain (Off-and-on ankle and knee pain). Negative for falls.  Neurological: Positive for dizziness (Mild orthostatic). Negative for focal weakness and weakness.  Psychiatric/Behavioral: Positive for memory loss. Negative for depression. The patient is not nervous/anxious and does  not have insomnia.        Difficulty with comprehension   I have reviewed and (if needed) personally updated the patient's problem list, medications, allergies, past medical and surgical history, social and family history.   PAST MEDICAL HISTORY   Past Medical History:  Diagnosis Date  . Acute MI, inferolateral wall, initial episode of care ([place]) 06/16/15   99% dCx   . BPH (benign prostatic hyperplasia) 04/08/2011  . CAD S/P percutaneous coronary angioplasty 11/23/12; 06/16/15   a. 2 overlapping mRCA lesions - Xience Xpedition DES 2.75 mm x 18 mm (3.0 mm);; b. Inf-Lat STEMI 06/16/2015 - dCx Asp Thrombectomy & PCI 3.0 x 15 Xience drDES (3.6 mm)  . Chronic headache   . CLL (chronic lymphocytic leukemia) ([place]) 04/08/2011  . Depression   . Diverticulitis   . Gallstone   . GERD (gastroesophageal reflux disease)   . Hypercholesteremia   . Ischemic cardiomyopathy    Echo 06/18/2015 EF 35-40% after lateral MI -> repeat echo 08/06/2015: EF 40-45% with basal-mid inferolateral akinesis and hypokinesis of the lateral wall.  . NSTEMI (non-ST elevated myocardial infarction) ([place]) 11/23/12   NSTEMI 11/23/2012 RCA lesion - PCI with DES; moderate LAD disease  . PTSD (post-traumatic stress disorder)    With Depression - since MI     PAST SURGICAL HISTORY   Past Surgical History:  Procedure Laterality Date  . APPENDECTOMY  1968-70  . CARDIAC CATHETERIZATION N/A 06/16/2015   Procedure: Left Heart Cath and Coronary Angiography;  Surgeon: Jettie Booze, MD;  Location: [facility];  Service: Cardiovascular: 99% thrombotic dCx -> asp thrombectomy & PCI. RCA Stents Patent.   Marland Kitchen CARDIAC CATHETERIZATION N/A 06/16/2015   Procedure: Coronary Stent Intervention;  Surgeon: Jettie Booze, MD;  Location: [facility];  Service: Cardiovascular: PCI dCx = thrombectomy -> 3.0 x 15 Xience drug-eluting stent (3.6 mm)   . COLONOSCOPY  2008   [id number] diverticula, diminutive polyp in the cecum,  s/p bx Normal rectum. Tubular adenoma  . COLONOSCOPY N/A 10/17/2012   Dr. Gala Romney: colonic diverticulosis, tubular adenoma, surveillance due May 2019  . COLONOSCOPY N/A 02/21/2018   Procedure: COLONOSCOPY;  Surgeon: Daneil Dolin, MD;  [facility];  Service: Endoscopy;  Laterality: N/A;  12:00  . CORONARY STENT PLACEMENT  11/23/12   Mid RCA -- Xience eXp - 2.75 mm x 18 mm DES (3.58mm) , Dr. Ellyn Hack  . LEFT HEART CATHETERIZATION WITH CORONARY ANGIOGRAM N/A 11/23/2012   Procedure: LEFT HEART CATHETERIZATION WITH CORONARY ANGIOGRAM;  Surgeon: Leonie Man, MD;  Location: [facility];  Service: Cardiovascular: NSTEMI: Culprit = mid RCA 95% & 75%; LAD ~40-50%, Small RI ~60%; EF ~60%, mild inf-basal HK     . TRANSTHORACIC ECHOCARDIOGRAM  January 2017; March 2017   a. EF 35-40%. Entire inferior hypokinesis and akinesis of the basal and mid inferolateral and anterolateral /distal lateral walls;; b. EF 40 and 45%. Akinesis of basal-mid inferolateral wall. Hypokinesis of entire lateral wall.  Cath - RCA PCI to Cx Jan 2017: 3.0 x 15 Xience drug-eluting stent (3.6 mm); patent RCA stent    MEDICATIONS/ALLERGIES   Current Meds  Medication Sig  . aspirin EC 81 MG tablet Take 81 mg by mouth daily.  . cholecalciferol (VITAMIN D) 1000 units tablet Take 1,000 Units by mouth once a week.  . clopidogrel (PLAVIX) 75 MG tablet Take 75 mg by mouth daily.  . furosemide (LASIX) 20 MG tablet Take 1 tablet (20 mg total) by mouth daily.  Marland Kitchen lisinopril (ZESTRIL) 5 MG tablet Take 1 tablet (5 mg total) by mouth daily.  . pantoprazole (PROTONIX) 40 MG tablet TAKE ONE TABLET BY MOUTH 30 MINUTES BEFORE A MEAL ONCE OR TWICE DAILY  . simvastatin (ZOCOR) 40 MG tablet Take 1 tablet (40 mg total) by mouth every other day. NEED OV.  . [DISCONTINUED] lisinopril (ZESTRIL) 2.5 MG tablet TAKE 1 TABLET BY MOUTH EVERY DAY    No Known Allergies   SOCIAL HISTORY/FAMILY HISTORY   Social History   Tobacco  Use  . Smoking status: Former Smoker    Packs/day: 0.25    Years: 2.00    Pack years: 0.50    Types: Cigarettes    Quit date: 06/07/1963    Years since quitting: 55.9  . Smokeless tobacco: Never Used  Substance Use Topics  . Alcohol use: No    Alcohol/week: 0.0 standard drinks    Comment: Occasionally drinks red wine  . Drug use: No   Social History   Social History Narrative   Exercises 3 to 4 days a week at the fitness center for about a half an hour at a time.   "former smoker". Does not drink EtOH   Married.    Family History family history includes Cancer in his father; Diabetes in his mother.   OBJCTIVE -PE, EKG, labs   Wt Readings from Last 3 Encounters:  04/22/19 151 lb (68.5 kg)  04/15/19 151 lb 9.6 oz (68.8 kg)  02/21/19 155 lb 12.8 oz (70.7 kg)    Physical Exam: BP 140/78   Pulse (!) 55   Temp (!) 97.2 F (36.2 Bryant)   Ht 5\' 9"  (1.753 m)   Wt 151 lb (68.5 kg)   SpO2 98%   BMI 22.30 kg/m  Physical Exam  Constitutional: He is oriented to person, place, and time. He appears well-developed and well-nourished. No distress.  Well-groomed.  Healthy-appearing.  HENT:  Head: Normocephalic and atraumatic.  Significant hearing loss  Neck: Normal range of motion. Neck supple. No hepatojugular reflux and no JVD present. Carotid bruit is not present. No thyromegaly present.  Cardiovascular: Regular rhythm, S1 normal, S2 normal and intact distal pulses.  Occasional extrasystoles are present. Bradycardia present. PMI is not displaced. Exam reveals distant heart sounds. Exam reveals no gallop and no friction rub.  No murmur heard. Pulmonary/Chest: Effort normal and breath sounds normal. No respiratory distress. He has no wheezes. He has no rales.  Abdominal: Soft. Bowel sounds are normal. He exhibits no distension. There is no abdominal tenderness. There is no rebound.  Musculoskeletal:        General: Edema (1-2+ lower extremity swelling with varicose veins and spider  veins.  ) present.  Neurological: He is alert and oriented to person, place, and time.  Skin: Skin is warm and dry.  No significant venous stasis changes.  Psychiatric: He has a normal mood and affect. His behavior is normal. Thought content normal.  Has a hard time comprehending  instructions.  Despite things been explained multiple times.  Vitals reviewed.    Adult ECG Report  Rate: 55 ;  Rhythm: sinus bradycardia and Normal axis, intervals and durations;   Narrative Interpretation: Stable EKG  Recent Labs: Was supposed of had labs checked last year, but did not get checked Lab Results  Component Value Date   CHOL 129 12/14/2016   HDL 32 (L) 12/14/2016   LDLCALC 65 12/14/2016   TRIG 159 (H) 12/14/2016   CHOLHDL 4.0 12/14/2016   Lab Results  Component Value Date   CREATININE 1.28 (H) 02/21/2019   BUN 13 02/21/2019   NA 142 02/21/2019   K 4.2 02/21/2019   CL 107 02/21/2019   CO2 29 02/21/2019    ASSESSMENT/PLAN    Problem List Items Addressed This Visit    CAD S/P PCI-- RCA DES 2014,  CFX DES Jan 2017 - Primary (Chronic)    Now has DES stents in both the RCA and circumflex.  No recurrent anginal symptoms. Was not able to tolerate low-dose beta-blocker because of bradycardia.  This has been stopped since I last saw him. He is on an ACE inhibitor which we are increasing the dose today. On standing dose of simvastatin.  Due for labs to be checked.  He is on aspirin plus Plavix.  Would be okay to hold aspirin if there is any concern of bleeding.  Okay to hold Plavix for procedures 5 to 7 days preop..      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  Follow-up echo showed EF 40 to 45% with basal to mid inferolateral wall akinesis and lateral wall hypokinesis.  Difficult to tell if this is related to  the previous RCA MI versus the circumflex MI.     He does exercise routinely, but not strenuous exertion.  At this level of activity no angina or heart failure.      Relevant Orders   EKG 12-Lead (Completed)   Dyslipidemia, goal LDL below 70 - on simvastatin; monitored by PCP (Chronic)    My understanding was that lipids are being followed by his PCP, he had labs checked but not a fasting lipid panel recently. Plan continue simvastatin for now and will check lipid panel.  Last documented LDL was 65.  We can try to have him get these labs checked up in [place]      Relevant Orders   Lipid panel   Lower leg edema (Chronic)    With no PND or orthopnea or increased abdominal girth, I suspect his edema is probably more related venous stasis than CHF.  He had been on low-dose HCTZ, now on Lasix which I told him that he can take an additional dose as needed. Again recommending support stockings.  We will give prescription for compression stockings for him to get filled at the [facility]. Also recommended foot elevation when possible along with lower extremity exercises.      Relevant Orders   Compression stockings   Benign essential HTN (Chronic)    Blood pressure is not well would like to be today.  Since his last visit with me 2 years ago, beta-blocker was held, however the plan was for the ACE inhibitor to be increased to 5 mg. The similar recommendation was made by his PCP indicated that he  wanted him to increase to 5 mg daily.  Despite this, the patient is still taking 2.5 mg tablets.  He has prescriptions for both written.  We will cancel the 2.5 mg prescription.      Ischemic cardiomyopathy (Chronic)    Post MI EF of 40 to 45% with likely combined effect of non-STEMI from 2014 and STEMI in 2017.  No active heart failure symptoms.  Euvolemic on exam.  He takes Lasix for standing edema which usually works.  I told him that he also should wear support stockings and have  given of his prescription for this.  Blood pressure is little high we will increase lisinopril to a full 5 mg daily as recommended by his PCP. Not on beta-blocker because of resting heart rate 55 bpm.      Relevant Orders   EKG 12-Lead (Completed)     Since he lives in the [place] area, the plan all along was for him to be followed up in our [place] office, however this never happened.  He now would like to stick with me as his primary cardiologist, but is agreeable to establish a relationship with the [facility] office staff for cases of urgent evaluation.  I will have him follow-up in 6 months with Bernerd Pho, PA up in [place] and alternate seeing him every other 6 months.   COVID-19 Education: The signs and symptoms of COVID-19 were discussed with the patient and how to seek care for testing (follow up with PCP or arrange E-visit).   The importance of social distancing was discussed today.  I spent a total of 32 minutes with the patient and chart review. >  50% of the time was spent in direct patient consultation.  Mr. Osmond is somewhat hard of hearing, and has a hard time understanding instructions.  I spent a lot of time going through medication adjustments with him.  Despite fully explaining these recommendations and then having my nurse explained them, had to return to clarify. Additional time spent with chart review (studies, outside notes, etc): 10 Total Time: 42 min  Current medicines are reviewed at length with the patient today.  (+/- concerns) n/a    Patient Instructions / Medication Changes & Studies & Tests Ordered   Patient Instructions  .Medication Instructions:   INCREASE AN TAKE LISINOPRIL  5 MG  DAILY- ONLY  CONTINUE WITH ALL OTHER MEDICATIONS   *If you need a refill on your cardiac medications before your next appointment, please call your pharmacy*  Lab Work: LIPID- FASTING  If you have labs (blood work) drawn today and your tests are  completely normal, you will receive your results only by: Marland Kitchen MyChart Message (if you have MyChart) OR . A paper copy in the mail If you have any lab test that is abnormal or we need to change your treatment, we will call you to review the results.  Testing/Procedures: NOT NEEDED  Follow-Up: At [facility], you and your health needs are our priority.  As part of our continuing mission to provide you with exceptional heart care, we have created designated Provider Care Teams.  These Care Teams include your primary Cardiologist (physician) and Advanced Practice Providers (APPs -  Physician Assistants and Nurse Practitioners) who all work together to provide you with the care you need, when you need it.  Your next appointment:   6 months- MAY 2021  The format for your next appointment:   In Person  Provider:   [place]- Bernerd Pho PA  MAY 2021  12 month with DR Ellyn Hack -NOV 2021  Other Instructions   Studies Ordered:   Orders Placed This Encounter  Procedures  . Compression stockings  . Lipid panel  . EKG 12-Lead     Glenetta Hew, M.D., M.S. Interventional Cardiologist   Pager # [phone] Phone # [phone] [address] [place],  [address]   Thank you for choosing Heartcare at [facility]!!

## 2019-04-22 NOTE — Patient Instructions (Addendum)
.  Medication Instructions:   INCREASE AN TAKE LISINOPRIL  5 MG  DAILY- ONLY  CONTINUE WITH ALL OTHER MEDICATIONS   *If you need a refill on your cardiac medications before your next appointment, please call your pharmacy*  Lab Work: LIPID- FASTING  If you have labs (blood work) drawn today and your tests are completely normal, you will receive your results only by: Marland Kitchen MyChart Message (if you have MyChart) OR . A paper copy in the mail If you have any lab test that is abnormal or we need to change your treatment, we will call you to review the results.  Testing/Procedures: NOT NEEDED  Follow-Up: At [facility], you and your health needs are our priority.  As part of our continuing mission to provide you with exceptional heart care, we have created designated Provider Care Teams.  These Care Teams include your primary Cardiologist (physician) and Advanced Practice Providers (APPs -  Physician Assistants and Nurse Practitioners) who all work together to provide you with the care you need, when you need it.  Your next appointment:   6 months- MAY 2021  The format for your next appointment:   In Person  Provider:   [place] 2021  12 month with DR Ellyn Hack -NOV 2021  Other Instructions

## 2019-04-24 ENCOUNTER — Encounter: Payer: Self-pay | Admitting: Cardiology

## 2019-04-24 NOTE — Assessment & Plan Note (Signed)
History of non-STEMI with PCI of the RCA in 2014 followed by inferolateral STEMI in 2017 with PCI to circumflex.  Follow-up echo showed EF 40 to 45% with basal to mid inferolateral wall akinesis and lateral wall hypokinesis.  Difficult to tell if this is related to the previous RCA MI versus the circumflex MI.     He does exercise routinely, but not strenuous exertion.  At this level of activity no angina or heart failure.

## 2019-04-24 NOTE — Assessment & Plan Note (Addendum)
With no PND or orthopnea or increased abdominal girth, I suspect his edema is probably more related venous stasis than CHF.  He had been on low-dose HCTZ, now on Lasix which I told him that he can take an additional dose as needed. Again recommending support stockings.  We will give prescription for compression stockings for him to get filled at the [facility]. Also recommended foot elevation when possible along with lower extremity exercises.

## 2019-04-24 NOTE — Assessment & Plan Note (Signed)
Blood pressure is not well would like to be today.  Since his last visit with me 2 years ago, beta-blocker was held, however the plan was for the ACE inhibitor to be increased to 5 mg. The similar recommendation was made by his PCP indicated that he wanted him to increase to 5 mg daily.  Despite this, the patient is still taking 2.5 mg tablets.  He has prescriptions for both written.  We will cancel the 2.5 mg prescription.

## 2019-04-24 NOTE — Assessment & Plan Note (Addendum)
Now has DES stents in both the RCA and circumflex.  No recurrent anginal symptoms. Was not able to tolerate low-dose beta-blocker because of bradycardia.  This has been stopped since I last saw him. He is on an ACE inhibitor which we are increasing the dose today. On standing dose of simvastatin.  Due for labs to be checked.  He is on aspirin plus Plavix.  Would be okay to hold aspirin if there is any concern of bleeding.  Okay to hold Plavix for procedures 5 to 7 days preop.[name]

## 2019-04-24 NOTE — Assessment & Plan Note (Addendum)
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  No active heart failure symptoms.  Euvolemic on exam.  He takes Lasix for standing edema which usually works.  I told him that he also should wear support stockings and have given of his prescription for this.  Blood pressure is little high we will increase lisinopril to a full 5 mg daily as recommended by his PCP. Not on beta-blocker because of resting heart rate 55 bpm.

## 2019-04-24 NOTE — Assessment & Plan Note (Addendum)
My understanding was that lipids are being followed by his PCP, he had labs checked but not a fasting lipid panel recently. Plan continue simvastatin for now and will check lipid panel.  Last documented LDL was 65.  We can try to have him get these labs checked up in [place]

## 2019-04-28 ENCOUNTER — Other Ambulatory Visit (INDEPENDENT_AMBULATORY_CARE_PROVIDER_SITE_OTHER): Payer: Self-pay | Admitting: Internal Medicine

## 2019-04-30 ENCOUNTER — Other Ambulatory Visit: Payer: Self-pay

## 2019-04-30 DIAGNOSIS — Z20822 Contact with and (suspected) exposure to covid-19: Secondary | ICD-10-CM

## 2019-05-01 LAB — NOVEL CORONAVIRUS, NAA: SARS-CoV-2, NAA: NOT DETECTED

## 2019-05-10 ENCOUNTER — Other Ambulatory Visit: Payer: Self-pay

## 2019-05-10 ENCOUNTER — Ambulatory Visit
Admission: EM | Admit: 2019-05-10 | Discharge: 2019-05-10 | Disposition: A | Discharge status: Home / Self Care | Payer: Medicare HMO | Attending: Emergency Medicine | Admitting: Emergency Medicine

## 2019-05-10 DIAGNOSIS — N401 Enlarged prostate with lower urinary tract symptoms: Secondary | ICD-10-CM | POA: Insufficient documentation

## 2019-05-10 DIAGNOSIS — R300 Dysuria: Secondary | ICD-10-CM | POA: Insufficient documentation

## 2019-05-10 DIAGNOSIS — R3914 Feeling of incomplete bladder emptying: Secondary | ICD-10-CM | POA: Insufficient documentation

## 2019-05-10 LAB — POCT URINALYSIS DIP (MANUAL ENTRY)
Bilirubin, UA: NEGATIVE
Glucose, UA: NEGATIVE mg/dL
Ketones, POC UA: NEGATIVE mg/dL
Leukocytes, UA: NEGATIVE
Nitrite, UA: NEGATIVE
Protein Ur, POC: NEGATIVE mg/dL
Spec Grav, UA: 1.015 (ref 1.010–1.025)
Urobilinogen, UA: 0.2 E.U./dL
pH, UA: 5 (ref 5.0–8.0)

## 2019-05-10 MED ORDER — PHENAZOPYRIDINE HCL 200 MG PO TABS: 200.0000 mg | ORAL_TABLET | Freq: Three times a day (TID) | ORAL | 0 refills | Status: DC | PRN

## 2019-05-10 MED ORDER — TAMSULOSIN HCL 0.4 MG PO CAPS: 0.4000 mg | ORAL_CAPSULE | Freq: Every day | ORAL | 0 refills | Status: DC

## 2019-05-10 NOTE — ED Triage Notes (Signed)
Pt reports dysuria that started last night

## 2019-05-10 NOTE — ED Provider Notes (Signed)
[facility]   CC: Burning with urination  SUBJECTIVE:  [name] is a 73 y.o. male hx significant with BPH, MI, CAD, CLL, diverticulitis, depression, GERD, and hypercholesteremia, who complains of dysuria and decreased urine output that began last night.  Patient denies a precipitating event.  Denies abdominal or flank pain.  Has NOT tried OTC medications.  Symptoms are made worse with urination.  Admits to similar symptoms in the past.  Denies fever, chills, nausea, vomiting, abdominal pain, flank pain, urinary hesitancy, decrease urine output.    LMP: No LMP for male patient.  ROS: As in HPI.  All other pertinent ROS negative.     Past Medical History:  Diagnosis Date  . Acute MI, inferolateral wall, initial episode of care ([place]) 06/16/15   99% dCx   . BPH (benign prostatic hyperplasia) 04/08/2011  . CAD S/P percutaneous coronary angioplasty 11/23/12; 06/16/15   a. 2 overlapping mRCA lesions - Xience Xpedition DES 2.75 mm x 18 mm (3.0 mm);; b. Inf-Lat STEMI 06/16/2015 - dCx Asp Thrombectomy & PCI 3.0 x 15 Xience drDES (3.6 mm)  . Chronic headache   . CLL (chronic lymphocytic leukemia) ([place]) 04/08/2011  . Depression   . Diverticulitis   . Gallstone   . GERD (gastroesophageal reflux disease)   . Hypercholesteremia   . Ischemic cardiomyopathy    Echo 06/18/2015 EF 35-40% after lateral MI -> repeat echo 08/06/2015: EF 40-45% with basal-mid inferolateral akinesis and hypokinesis of the lateral wall.  . NSTEMI (non-ST elevated myocardial infarction) ([place]) 11/23/12   NSTEMI 11/23/2012 RCA lesion - PCI with DES; moderate LAD disease  . PTSD (post-traumatic stress disorder)    With Depression - since MI   Past Surgical History:  Procedure Laterality Date  . APPENDECTOMY  1968-70  . CARDIAC CATHETERIZATION N/A 06/16/2015   Procedure: Left Heart Cath and Coronary Angiography;  Surgeon: Jettie Booze, MD;  Location: [facility];  Service: Cardiovascular: 99% thrombotic  dCx -> asp thrombectomy & PCI. RCA Stents Patent.   Marland Kitchen CARDIAC CATHETERIZATION N/A 06/16/2015   Procedure: Coronary Stent Intervention;  Surgeon: Jettie Booze, MD;  Location: [facility];  Service: Cardiovascular: PCI dCx = thrombectomy -> 3.0 x 15 Xience drug-eluting stent (3.6 mm)   . COLONOSCOPY  2008   [id number] diverticula, diminutive polyp in the cecum, s/p bx Normal rectum. Tubular adenoma  . COLONOSCOPY N/A 10/17/2012   Dr. Gala Romney: colonic diverticulosis, tubular adenoma, surveillance due May 2019  . COLONOSCOPY N/A 02/21/2018   Procedure: COLONOSCOPY;  Surgeon: Daneil Dolin, MD;  Location: [facility];  Service: Endoscopy;  Laterality: N/A;  12:00  . CORONARY STENT PLACEMENT  11/23/12   Mid RCA -- Xience eXp - 2.75 mm x 18 mm DES (3.14mm) , Dr. Ellyn Hack  . LEFT HEART CATHETERIZATION WITH CORONARY ANGIOGRAM N/A 11/23/2012   Procedure: LEFT HEART CATHETERIZATION WITH CORONARY ANGIOGRAM;  Surgeon: Leonie Man, MD;  Location: [facility] CATH [facility];  Service: Cardiovascular: NSTEMI: Culprit = mid RCA 95% & 75%; LAD ~40-50%, Small RI ~60%; EF ~60%, mild inf-basal HK     . TRANSTHORACIC ECHOCARDIOGRAM  January 2017; March 2017   a. EF 35-40%. Entire inferior hypokinesis and akinesis of the basal and mid inferolateral and anterolateral /distal lateral walls;; b. EF 40 and 45%. Akinesis of basal-mid inferolateral wall. Hypokinesis of entire lateral wall.   No Known Allergies No current facility-administered medications on file prior to encounter.    Current Outpatient Medications on  File Prior to Encounter  Medication Sig Dispense Refill  . aspirin EC 81 MG tablet Take 81 mg by mouth daily.    . cholecalciferol (VITAMIN D) 1000 units tablet Take 1,000 Units by mouth once a week.    . clopidogrel (PLAVIX) 75 MG tablet Take 75 mg by mouth daily.    . furosemide (LASIX) 20 MG tablet Take 1 tablet (20 mg total) by mouth daily. 90 tablet 0  . lisinopril (ZESTRIL) 5 MG tablet Take 1  tablet (5 mg total) by mouth daily. 90 tablet 0  . pantoprazole (PROTONIX) 40 MG tablet TAKE ONE TABLET BY MOUTH 30 MINUTES BEFORE A MEAL ONCE OR TWICE DAILY 60 tablet 3  . simvastatin (ZOCOR) 40 MG tablet TAKE 1 TABLET BY MOUTH EVERY DAY 90 tablet 0  . [DISCONTINUED] lisinopril (PRINIVIL,ZESTRIL) 2.5 MG tablet Take 1 tablet (2.5 mg total) by mouth daily. 90 tablet 3  . [DISCONTINUED] nitroGLYCERIN (NITROSTAT) 0.4 MG SL tablet Place 1 tablet (0.4 mg total) under the tongue every 5 (five) minutes as needed for chest pain. (Patient not taking: Reported on 05/14/2018) 25 tablet 3  . [DISCONTINUED] simvastatin (ZOCOR) 40 MG tablet Take 1 tablet (40 mg total) by mouth every other day. NEED OV. 15 tablet 0   Social History   Socioeconomic History  . Marital status: Married    Spouse name: Not on file  . Number of children: Not on file  . Years of education: Not on file  . Highest education level: Not on file  Occupational History  . Not on file  Social Needs  . Financial resource strain: Not on file  . Food insecurity    Worry: Not on file    Inability: Not on file  . Transportation needs    Medical: Not on file    Non-medical: Not on file  Tobacco Use  . Smoking status: Former Smoker    Packs/day: 0.25    Years: 2.00    Pack years: 0.50    Types: Cigarettes    Quit date: 06/07/1963    Years since quitting: 55.9  . Smokeless tobacco: Never Used  Substance and Sexual Activity  . Alcohol use: No    Alcohol/week: 0.0 standard drinks    Comment: Occasionally drinks red wine  . Drug use: No  . Sexual activity: Not on file  Lifestyle  . Physical activity    Days per week: Not on file    Minutes per session: Not on file  . Stress: Not on file  Relationships  . Social Herbalist on phone: Not on file    Gets together: Not on file    Attends religious service: Not on file    Active member of club or organization: Not on file    Attends meetings of clubs or organizations:  Not on file    Relationship status: Not on file  . Intimate partner violence    Fear of current or ex partner: Not on file    Emotionally abused: Not on file    Physically abused: Not on file    Forced sexual activity: Not on file  Other Topics Concern  . Not on file  Social History Narrative   Exercises 3 to 4 days a week at the fitness center for about a half an hour at a time.   "former smoker". Does not drink EtOH   Married.   Family History  Problem Relation Age of Onset  . Diabetes Mother   .  Cancer Father   . Colon cancer Neg Hx     OBJECTIVE:  Vitals:   05/10/19 1415  BP: 123/67  Pulse: 72  Resp: 16  Temp: 97.7 F (36.5 C)  TempSrc: Oral  SpO2: 98%   General appearance: Alert in no acute distress HEENT: NCAT.  Oropharynx clear.  Lungs: clear to auscultation bilaterally without adventitious breath sounds Heart: regular rate and rhythm.   Abdomen: soft; non-distended; no tenderness; bowel sounds present; no guarding  Back: no CVA tenderness Extremities: no edema; symmetrical with no gross deformities Skin: warm and dry Neurologic: Ambulates from chair to exam table without difficulty Psychological: alert and cooperative; normal mood and affect  Labs Reviewed  POCT URINALYSIS DIP (MANUAL ENTRY) - Abnormal; Notable for the following components:      Result Value   Blood, UA trace-intact (*)    All other components within normal limits  URINE CULTURE    ASSESSMENT & PLAN:  1. Dysuria   2. Benign prostatic hyperplasia with incomplete bladder emptying     Meds ordered this encounter  Medications  . phenazopyridine (PYRIDIUM) 200 MG tablet    Sig: Take 1 tablet (200 mg total) by mouth 3 (three) times daily as needed for pain.    Dispense:  15 tablet    Refill:  0    Order Specific Question:   Supervising Provider    Answer:   Raylene Everts [id number]  . tamsulosin (FLOMAX) 0.4 MG CAPS capsule    Sig: Take 1 capsule (0.4 mg total) by mouth daily  after breakfast.    Dispense:  30 capsule    Refill:  0    Order Specific Question:   Supervising Provider    Answer:   Raylene Everts [id number]   Urine did not show signs of infection, but trace blood Symptoms most likely secondary to enlarged prostate Urine culture sent.  We will call you with the results.   Push fluids and get plenty of rest.   Take pyridium as prescribed and as needed for symptomatic relief Follow up with urologist Dr. Alyson Ingles ASAP for further evaluation and management of symptoms Return here or go to ER if you have any new or worsening symptoms such as fever, abdominal pain, nausea/vomiting, flank pain, inability to urinate, etc...   Outlined signs and symptoms indicating need for more acute intervention. Patient verbalized understanding. After Visit Summary given.     Lestine Box, PA-C 05/10/19 1531

## 2019-05-10 NOTE — Discharge Instructions (Addendum)
Urine did not show signs of infection, but trace blood Symptoms most likely secondary to enlarged prostate Urine culture sent.  We will call you with the results.   Push fluids and get plenty of rest.   Take pyridium as prescribed and as needed for symptomatic relief Follow up with urologist Dr. Alyson Ingles ASAP for further evaluation and management of symptoms Return here or go to ER if you have any new or worsening symptoms such as fever, abdominal pain, nausea/vomiting, flank pain, inability to urinate, etc..Marland Kitchen

## 2019-05-12 LAB — URINE CULTURE: Culture: NO GROWTH

## 2019-05-14 ENCOUNTER — Telehealth (INDEPENDENT_AMBULATORY_CARE_PROVIDER_SITE_OTHER): Payer: Self-pay

## 2019-05-15 ENCOUNTER — Other Ambulatory Visit: Payer: Self-pay

## 2019-05-15 ENCOUNTER — Emergency Department (HOSPITAL_COMMUNITY): Payer: Medicare HMO

## 2019-05-15 ENCOUNTER — Inpatient Hospital Stay (HOSPITAL_COMMUNITY): Payer: Medicare HMO

## 2019-05-15 ENCOUNTER — Encounter (HOSPITAL_COMMUNITY): Payer: Self-pay | Admitting: Emergency Medicine

## 2019-05-15 ENCOUNTER — Telehealth (INDEPENDENT_AMBULATORY_CARE_PROVIDER_SITE_OTHER): Payer: Medicare HMO | Admitting: Nurse Practitioner

## 2019-05-15 ENCOUNTER — Inpatient Hospital Stay (HOSPITAL_COMMUNITY)
Admission: EM | Admit: 2019-05-15 | Discharge: 2019-05-29 | DRG: 871 | Disposition: A | Discharge status: Skilled Nursing Facility | Payer: Medicare HMO | Attending: Family Medicine | Admitting: Family Medicine

## 2019-05-15 ENCOUNTER — Other Ambulatory Visit (INDEPENDENT_AMBULATORY_CARE_PROVIDER_SITE_OTHER): Payer: Self-pay

## 2019-05-15 DIAGNOSIS — J9601 Acute respiratory failure with hypoxia: Secondary | ICD-10-CM | POA: Diagnosis present

## 2019-05-15 DIAGNOSIS — I5022 Chronic systolic (congestive) heart failure: Secondary | ICD-10-CM | POA: Diagnosis present

## 2019-05-15 DIAGNOSIS — I10 Essential (primary) hypertension: Secondary | ICD-10-CM | POA: Diagnosis present

## 2019-05-15 DIAGNOSIS — K5791 Diverticulosis of intestine, part unspecified, without perforation or abscess with bleeding: Secondary | ICD-10-CM

## 2019-05-15 DIAGNOSIS — F329 Major depressive disorder, single episode, unspecified: Secondary | ICD-10-CM | POA: Diagnosis present

## 2019-05-15 DIAGNOSIS — A4189 Other specified sepsis: Principal | ICD-10-CM | POA: Diagnosis present

## 2019-05-15 DIAGNOSIS — D849 Immunodeficiency, unspecified: Secondary | ICD-10-CM | POA: Diagnosis present

## 2019-05-15 DIAGNOSIS — C9110 Chronic lymphocytic leukemia of B-cell type not having achieved remission: Secondary | ICD-10-CM | POA: Diagnosis present

## 2019-05-15 DIAGNOSIS — N401 Enlarged prostate with lower urinary tract symptoms: Secondary | ICD-10-CM | POA: Diagnosis not present

## 2019-05-15 DIAGNOSIS — E876 Hypokalemia: Secondary | ICD-10-CM | POA: Diagnosis present

## 2019-05-15 DIAGNOSIS — T380X5A Adverse effect of glucocorticoids and synthetic analogues, initial encounter: Secondary | ICD-10-CM | POA: Diagnosis present

## 2019-05-15 DIAGNOSIS — N179 Acute kidney failure, unspecified: Secondary | ICD-10-CM | POA: Diagnosis present

## 2019-05-15 DIAGNOSIS — Z452 Encounter for adjustment and management of vascular access device: Secondary | ICD-10-CM

## 2019-05-15 DIAGNOSIS — N400 Enlarged prostate without lower urinary tract symptoms: Secondary | ICD-10-CM | POA: Diagnosis present

## 2019-05-15 DIAGNOSIS — I2510 Atherosclerotic heart disease of native coronary artery without angina pectoris: Secondary | ICD-10-CM | POA: Diagnosis present

## 2019-05-15 DIAGNOSIS — N1831 Chronic kidney disease, stage 3a: Secondary | ICD-10-CM | POA: Diagnosis present

## 2019-05-15 DIAGNOSIS — J690 Pneumonitis due to inhalation of food and vomit: Secondary | ICD-10-CM | POA: Diagnosis present

## 2019-05-15 DIAGNOSIS — J189 Pneumonia, unspecified organism: Secondary | ICD-10-CM

## 2019-05-15 DIAGNOSIS — R410 Disorientation, unspecified: Secondary | ICD-10-CM | POA: Diagnosis present

## 2019-05-15 DIAGNOSIS — J1289 Other viral pneumonia: Secondary | ICD-10-CM | POA: Diagnosis present

## 2019-05-15 DIAGNOSIS — Z955 Presence of coronary angioplasty implant and graft: Secondary | ICD-10-CM

## 2019-05-15 DIAGNOSIS — R791 Abnormal coagulation profile: Secondary | ICD-10-CM | POA: Diagnosis present

## 2019-05-15 DIAGNOSIS — K5733 Diverticulitis of large intestine without perforation or abscess with bleeding: Secondary | ICD-10-CM | POA: Diagnosis present

## 2019-05-15 DIAGNOSIS — F4310 Post-traumatic stress disorder, unspecified: Secondary | ICD-10-CM | POA: Diagnosis present

## 2019-05-15 DIAGNOSIS — I82433 Acute embolism and thrombosis of popliteal vein, bilateral: Secondary | ICD-10-CM | POA: Diagnosis present

## 2019-05-15 DIAGNOSIS — I130 Hypertensive heart and chronic kidney disease with heart failure and stage 1 through stage 4 chronic kidney disease, or unspecified chronic kidney disease: Secondary | ICD-10-CM | POA: Diagnosis present

## 2019-05-15 DIAGNOSIS — I255 Ischemic cardiomyopathy: Secondary | ICD-10-CM | POA: Diagnosis not present

## 2019-05-15 DIAGNOSIS — E860 Dehydration: Secondary | ICD-10-CM | POA: Diagnosis present

## 2019-05-15 DIAGNOSIS — D62 Acute posthemorrhagic anemia: Secondary | ICD-10-CM | POA: Diagnosis not present

## 2019-05-15 DIAGNOSIS — E872 Acidosis: Secondary | ICD-10-CM | POA: Diagnosis present

## 2019-05-15 DIAGNOSIS — A419 Sepsis, unspecified organism: Secondary | ICD-10-CM

## 2019-05-15 DIAGNOSIS — E7800 Pure hypercholesterolemia, unspecified: Secondary | ICD-10-CM | POA: Diagnosis present

## 2019-05-15 DIAGNOSIS — J1282 Pneumonia due to coronavirus disease 2019: Secondary | ICD-10-CM | POA: Diagnosis present

## 2019-05-15 DIAGNOSIS — Z87891 Personal history of nicotine dependence: Secondary | ICD-10-CM

## 2019-05-15 DIAGNOSIS — R519 Headache, unspecified: Secondary | ICD-10-CM | POA: Diagnosis present

## 2019-05-15 DIAGNOSIS — I82441 Acute embolism and thrombosis of right tibial vein: Secondary | ICD-10-CM | POA: Diagnosis present

## 2019-05-15 DIAGNOSIS — Z87440 Personal history of urinary (tract) infections: Secondary | ICD-10-CM

## 2019-05-15 DIAGNOSIS — I82409 Acute embolism and thrombosis of unspecified deep veins of unspecified lower extremity: Secondary | ICD-10-CM

## 2019-05-15 DIAGNOSIS — R1310 Dysphagia, unspecified: Secondary | ICD-10-CM

## 2019-05-15 DIAGNOSIS — R6521 Severe sepsis with septic shock: Secondary | ICD-10-CM | POA: Diagnosis not present

## 2019-05-15 DIAGNOSIS — E1122 Type 2 diabetes mellitus with diabetic chronic kidney disease: Secondary | ICD-10-CM | POA: Diagnosis present

## 2019-05-15 DIAGNOSIS — U071 COVID-19: Secondary | ICD-10-CM | POA: Diagnosis present

## 2019-05-15 DIAGNOSIS — Z7902 Long term (current) use of antithrombotics/antiplatelets: Secondary | ICD-10-CM

## 2019-05-15 DIAGNOSIS — Z833 Family history of diabetes mellitus: Secondary | ICD-10-CM

## 2019-05-15 DIAGNOSIS — K921 Melena: Secondary | ICD-10-CM | POA: Diagnosis not present

## 2019-05-15 DIAGNOSIS — Z9861 Coronary angioplasty status: Secondary | ICD-10-CM

## 2019-05-15 DIAGNOSIS — Z86010 Personal history of colonic polyps: Secondary | ICD-10-CM

## 2019-05-15 DIAGNOSIS — I252 Old myocardial infarction: Secondary | ICD-10-CM

## 2019-05-15 DIAGNOSIS — R578 Other shock: Secondary | ICD-10-CM | POA: Diagnosis not present

## 2019-05-15 DIAGNOSIS — R310 Gross hematuria: Secondary | ICD-10-CM | POA: Diagnosis present

## 2019-05-15 DIAGNOSIS — Z79899 Other long term (current) drug therapy: Secondary | ICD-10-CM

## 2019-05-15 DIAGNOSIS — R0902 Hypoxemia: Secondary | ICD-10-CM

## 2019-05-15 DIAGNOSIS — R1314 Dysphagia, pharyngoesophageal phase: Secondary | ICD-10-CM | POA: Diagnosis present

## 2019-05-15 DIAGNOSIS — K219 Gastro-esophageal reflux disease without esophagitis: Secondary | ICD-10-CM | POA: Diagnosis present

## 2019-05-15 LAB — COMPREHENSIVE METABOLIC PANEL
ALT: 13 U/L (ref 0–44)
AST: 36 U/L (ref 15–41)
Albumin: 2.7 g/dL — ABNORMAL LOW (ref 3.5–5.0)
Alkaline Phosphatase: 51 U/L (ref 38–126)
Anion gap: 13 (ref 5–15)
BUN: 28 mg/dL — ABNORMAL HIGH (ref 8–23)
CO2: 20 mmol/L — ABNORMAL LOW (ref 22–32)
Calcium: 7.8 mg/dL — ABNORMAL LOW (ref 8.9–10.3)
Chloride: 100 mmol/L (ref 98–111)
Creatinine, Ser: 1.7 mg/dL — ABNORMAL HIGH (ref 0.61–1.24)
GFR calc Af Amer: 45 mL/min — ABNORMAL LOW (ref >60)
GFR calc non Af Amer: 39 mL/min — ABNORMAL LOW (ref >60)
Glucose, Bld: 130 mg/dL — ABNORMAL HIGH (ref 70–99)
Potassium: 3.4 mmol/L — ABNORMAL LOW (ref 3.5–5.1)
Sodium: 133 mmol/L — ABNORMAL LOW (ref 135–145)
Total Bilirubin: 1.1 mg/dL (ref 0.3–1.2)
Total Protein: 6 g/dL — ABNORMAL LOW (ref 6.5–8.1)

## 2019-05-15 LAB — CBC WITH DIFFERENTIAL/PLATELET
Abs Immature Granulocytes: 0.3 10*3/uL — ABNORMAL HIGH (ref 0.00–0.07)
Basophils Absolute: 0.1 10*3/uL (ref 0.0–0.1)
Basophils Relative: 0 %
Eosinophils Absolute: 0 10*3/uL (ref 0.0–0.5)
Eosinophils Relative: 0 %
HCT: 38.6 % — ABNORMAL LOW (ref 39.0–52.0)
Hemoglobin: 12 g/dL — ABNORMAL LOW (ref 13.0–17.0)
Immature Granulocytes: 1 %
Lymphocytes Relative: 83 %
Lymphs Abs: 32.5 10*3/uL — ABNORMAL HIGH (ref 0.7–4.0)
MCH: 29.8 pg (ref 26.0–34.0)
MCHC: 31.1 g/dL (ref 30.0–36.0)
MCV: 95.8 fL (ref 80.0–100.0)
Monocytes Absolute: 0.1 10*3/uL (ref 0.1–1.0)
Monocytes Relative: 0 %
Neutro Abs: 6.2 10*3/uL (ref 1.7–7.7)
Neutrophils Relative %: 16 %
Platelets: 139 10*3/uL — ABNORMAL LOW (ref 150–400)
RBC: 4.03 MIL/uL — ABNORMAL LOW (ref 4.22–5.81)
RDW: 13.8 % (ref 11.5–15.5)
WBC: 39.2 10*3/uL — ABNORMAL HIGH (ref 4.0–10.5)
nRBC: 0.1 % (ref 0.0–0.2)

## 2019-05-15 LAB — APTT: aPTT: 36 seconds (ref 24–36)

## 2019-05-15 LAB — URINALYSIS, ROUTINE W REFLEX MICROSCOPIC
Bacteria, UA: NONE SEEN
Bilirubin Urine: NEGATIVE
Glucose, UA: NEGATIVE mg/dL
Ketones, ur: NEGATIVE mg/dL
Leukocytes,Ua: NEGATIVE
Nitrite: POSITIVE — AB
Protein, ur: 100 mg/dL — AB
RBC / HPF: >50 RBC/hpf — ABNORMAL HIGH (ref 0–5)
Specific Gravity, Urine: 1.019 (ref 1.005–1.030)
pH: 5 (ref 5.0–8.0)

## 2019-05-15 LAB — LACTIC ACID, PLASMA: Lactic Acid, Venous: 1.1 mmol/L (ref 0.5–1.9)

## 2019-05-15 LAB — PROTIME-INR
INR: 1.2 (ref 0.8–1.2)
Prothrombin Time: 14.9 seconds (ref 11.4–15.2)

## 2019-05-15 LAB — POC SARS CORONAVIRUS 2 AG -  ED: SARS Coronavirus 2 Ag: POSITIVE — AB

## 2019-05-15 LAB — C-REACTIVE PROTEIN: CRP: 18.5 mg/dL — ABNORMAL HIGH (ref <1.0)

## 2019-05-15 LAB — LACTATE DEHYDROGENASE: LDH: 184 U/L (ref 98–192)

## 2019-05-15 LAB — PROCALCITONIN: Procalcitonin: 1.12 ng/mL

## 2019-05-15 LAB — TRIGLYCERIDES: Triglycerides: 52 mg/dL (ref <150)

## 2019-05-15 LAB — FERRITIN: Ferritin: 843 ng/mL — ABNORMAL HIGH (ref 24–336)

## 2019-05-15 LAB — D-DIMER, QUANTITATIVE: D-Dimer, Quant: 4.33 ug/mL-FEU — ABNORMAL HIGH (ref 0.00–0.50)

## 2019-05-15 LAB — FIBRINOGEN: Fibrinogen: 605 mg/dL — ABNORMAL HIGH (ref 210–475)

## 2019-05-15 MED ORDER — SODIUM CHLORIDE 0.9 % IV SOLN: 200.0000 mg | Freq: Once | INTRAVENOUS | Status: DC

## 2019-05-15 MED ORDER — SODIUM CHLORIDE 0.9 % IV BOLUS
500.0000 mL | Freq: Once | INTRAVENOUS | Status: AC
  Administered 2019-05-15: 500 mL via INTRAVENOUS

## 2019-05-15 MED ORDER — DEXAMETHASONE SODIUM PHOSPHATE 10 MG/ML IJ SOLN
6.0000 mg | Freq: Once | INTRAMUSCULAR | Status: AC
  Administered 2019-05-15: 6 mg via INTRAVENOUS
  Filled 2019-05-15: qty 1

## 2019-05-15 MED ORDER — SODIUM CHLORIDE 0.9 % IV SOLN
500.0000 mg | INTRAVENOUS | Status: AC
  Administered 2019-05-15 – 2019-05-19 (×5): 500 mg via INTRAVENOUS
  Filled 2019-05-15 (×5): qty 500

## 2019-05-15 MED ORDER — ASPIRIN EC 81 MG PO TBEC
81.0000 mg | DELAYED_RELEASE_TABLET | Freq: Every day | ORAL | Status: DC
  Administered 2019-05-16 – 2019-05-22 (×7): 81 mg via ORAL
  Filled 2019-05-15 (×7): qty 1

## 2019-05-15 MED ORDER — VITAMIN D3 25 MCG PO TABS: 1000.0000 [IU] | ORAL_TABLET | ORAL | Status: DC

## 2019-05-15 MED ORDER — SODIUM CHLORIDE 0.9 % IV SOLN
100.0000 mg | Freq: Every day | INTRAVENOUS | Status: AC
  Administered 2019-05-16 – 2019-05-19 (×4): 100 mg via INTRAVENOUS
  Filled 2019-05-15 (×5): qty 20

## 2019-05-15 MED ORDER — DEXAMETHASONE 6 MG PO TABS
6.0000 mg | ORAL_TABLET | ORAL | Status: DC
  Administered 2019-05-15 – 2019-05-20 (×6): 6 mg via ORAL
  Filled 2019-05-15 (×5): qty 1
  Filled 2019-05-15: qty 2
  Filled 2019-05-15: qty 1

## 2019-05-15 MED ORDER — SODIUM CHLORIDE 0.9 % IV SOLN
INTRAVENOUS | Status: DC
  Administered 2019-05-15: 125 mL/h via INTRAVENOUS

## 2019-05-15 MED ORDER — ENOXAPARIN SODIUM 40 MG/0.4ML ~~LOC~~ SOLN
40.0000 mg | Freq: Every day | SUBCUTANEOUS | Status: DC
  Administered 2019-05-15 – 2019-05-21 (×7): 40 mg via SUBCUTANEOUS
  Filled 2019-05-15 (×8): qty 0.4

## 2019-05-15 MED ORDER — SODIUM CHLORIDE 0.9 % IV SOLN
200.0000 mg | Freq: Once | INTRAVENOUS | Status: AC
  Administered 2019-05-15: 200 mg via INTRAVENOUS
  Filled 2019-05-15: qty 40

## 2019-05-15 MED ORDER — SODIUM CHLORIDE 0.9 % IV SOLN
500.0000 mg | INTRAVENOUS | Status: DC
  Filled 2019-05-15: qty 500

## 2019-05-15 MED ORDER — POTASSIUM CHLORIDE CRYS ER 20 MEQ PO TBCR
40.0000 meq | EXTENDED_RELEASE_TABLET | Freq: Once | ORAL | Status: AC
  Administered 2019-05-15: 40 meq via ORAL
  Filled 2019-05-15: qty 2

## 2019-05-15 MED ORDER — DEXAMETHASONE SODIUM PHOSPHATE 4 MG/ML IJ SOLN
4.0000 mg | Freq: Once | INTRAMUSCULAR | Status: AC
  Administered 2019-05-15: 4 mg via INTRAVENOUS
  Filled 2019-05-15: qty 1

## 2019-05-15 MED ORDER — ONDANSETRON HCL 4 MG/2ML IJ SOLN: 4.0000 mg | Freq: Four times a day (QID) | INTRAMUSCULAR | Status: DC | PRN

## 2019-05-15 MED ORDER — SODIUM CHLORIDE 0.9 % IV SOLN
1.0000 g | INTRAVENOUS | Status: AC
  Administered 2019-05-15 – 2019-05-19 (×5): 1 g via INTRAVENOUS
  Filled 2019-05-15 (×4): qty 10

## 2019-05-15 MED ORDER — CLOPIDOGREL BISULFATE 75 MG PO TABS
75.0000 mg | ORAL_TABLET | Freq: Every day | ORAL | Status: DC
  Administered 2019-05-16 – 2019-05-22 (×7): 75 mg via ORAL
  Filled 2019-05-15 (×7): qty 1

## 2019-05-15 MED ORDER — ONDANSETRON HCL 4 MG PO TABS: 4.0000 mg | ORAL_TABLET | Freq: Four times a day (QID) | ORAL | Status: DC | PRN

## 2019-05-15 MED ORDER — SIMVASTATIN 20 MG PO TABS
40.0000 mg | ORAL_TABLET | Freq: Every day | ORAL | Status: DC
  Administered 2019-05-16 – 2019-05-29 (×13): 40 mg via ORAL
  Filled 2019-05-15 (×15): qty 2

## 2019-05-15 MED ORDER — SODIUM CHLORIDE 0.9 % IV BOLUS
1000.0000 mL | Freq: Once | INTRAVENOUS | Status: AC
  Administered 2019-05-15: 1000 mL via INTRAVENOUS

## 2019-05-15 MED ORDER — ACETAMINOPHEN 325 MG PO TABS
650.0000 mg | ORAL_TABLET | Freq: Four times a day (QID) | ORAL | Status: DC | PRN
  Administered 2019-05-16 – 2019-05-26 (×2): 650 mg via ORAL
  Filled 2019-05-15 (×2): qty 2

## 2019-05-15 MED ORDER — SODIUM CHLORIDE 0.9 % IV SOLN: 100.0000 mg | Freq: Every day | INTRAVENOUS | Status: DC

## 2019-05-15 MED ORDER — PANTOPRAZOLE SODIUM 40 MG PO TBEC
40.0000 mg | DELAYED_RELEASE_TABLET | Freq: Two times a day (BID) | ORAL | Status: DC
  Administered 2019-05-16 – 2019-05-22 (×14): 40 mg via ORAL
  Filled 2019-05-15 (×14): qty 1

## 2019-05-15 NOTE — Telephone Encounter (Signed)
Sent over referral for Dr Alyson Ingles.

## 2019-05-15 NOTE — Progress Notes (Signed)
Referral in process for DR McKenzie- [facility].

## 2019-05-15 NOTE — ED Notes (Signed)
First azithromycin bag defective. Have discarded and going to start another

## 2019-05-15 NOTE — Telephone Encounter (Signed)
Please refer to Urology Dr Alyson Ingles for BPH.Thanks.

## 2019-05-15 NOTE — ED Provider Notes (Signed)
[facility] EMERGENCY DEPARTMENT Provider Note   CSN: [id number] Arrival date & time: 05/15/19  1313     History   Chief Complaint Chief Complaint  Patient presents with  . Recurrent UTI    HPI [name] is a 73 y.o. male history CAD/MI, BPH, CLL, diverticulosis, GERD, hypercholesterolemia, ischemic cardiomyopathy, hypertension.  Patient presents to the ER via EMS for altered mental status, dysuria, fever and cough.  Patient noted to be febrile with temp 102.8 F temporal thermometer and hypoxic at 75% on room air.  Patient was placed on nasal cannula at 4 L with improvement to the 90s given 1 g Rocephin, 4 mg Zofran, 1 g Tylenol prior to arrival by EMS.  Patient seen at urgent care on 05/10/2019 for dysuria, at that time UA not felt to be infectious did show trace blood, symptoms thought to be secondary to enlarged prostate he was treated with Pyridium and Flomax and encouraged to follow-up with urology.  Patient reports that over the past several days his symptoms have worsened he reports dysuria has continued but not worsened he reports he has developed a cough productive with yellow/green sputum, he endorses generalized body aches and fatigue as well.  He reports that he has fallen several times over the past few days and has hit his head on several occasions he denies any loss of consciousness.  Patient is alert to self and place but not to time, level 5 caveat for altered mental status.  No family at bedside to provide supplemental history.    HPI  Past Medical History:  Diagnosis Date  . Acute MI, inferolateral wall, initial episode of care ([place]) 06/16/15   99% dCx   . BPH (benign prostatic hyperplasia) 04/08/2011  . CAD S/P percutaneous coronary angioplasty 11/23/12; 06/16/15   a. 2 overlapping mRCA lesions - Xience Xpedition DES 2.75 mm x 18 mm (3.0 mm);; b. Inf-Lat STEMI 06/16/2015 - dCx Asp Thrombectomy & PCI 3.0 x 15 Xience drDES (3.6 mm)  . Chronic headache   . CLL  (chronic lymphocytic leukemia) ([place]) 04/08/2011  . Depression   . Diverticulitis   . Gallstone   . GERD (gastroesophageal reflux disease)   . Hypercholesteremia   . Ischemic cardiomyopathy    Echo 06/18/2015 EF 35-40% after lateral MI -> repeat echo 08/06/2015: EF 40-45% with basal-mid inferolateral akinesis and hypokinesis of the lateral wall.  . NSTEMI (non-ST elevated myocardial infarction) ([place]) 11/23/12   NSTEMI 11/23/2012 RCA lesion - PCI with DES; moderate LAD disease  . PTSD (post-traumatic stress disorder)    With Depression - since MI    Patient Active Problem List   Diagnosis Date Noted  . Sepsis ([place]) 05/15/2019  . Abdominal pain 03/06/2018  . Constipation 03/06/2018  . GERD (gastroesophageal reflux disease) 03/08/2016  . Esophageal dysphagia 03/08/2016  . Chronic cough 03/08/2016  . Ischemic cardiomyopathy   . STEMI - 06/16/15 06/16/2015  . Benign essential HTN 06/16/2015  . Myocardial infarction of inferolateral wall ([place]) 06/16/2015  . Cholelithiasis without cholecystitis 12/18/2013  . Hematochezia 06/04/2013  . Lower leg edema 03/14/2013  . PTSD (post-traumatic stress disorder)   . CAD S/P PCI-- RCA DES 2014,  CFX DES Jan 2017 11/24/2012  . NSTEMI (non-ST elevated myocardial infarction) - history of 11/23/2012  . Hyperglycemia 11/23/2012  . History of smoking 11/23/2012  . Dyslipidemia, goal LDL below 70 - on simvastatin; monitored by PCP 11/23/2012  . Diverticulitis of colon without hemorrhage- on antibiotics for recent flair up  09/19/2012  . Hx of adenomatous colonic polyps 09/19/2012  . CLL (chronic lymphocytic leukemia) ([place]) 04/08/2011  . BPH (benign prostatic hyperplasia) 04/08/2011    Past Surgical History:  Procedure Laterality Date  . APPENDECTOMY  1968-70  . CARDIAC CATHETERIZATION N/A 06/16/2015   Procedure: Left Heart Cath and Coronary Angiography;  Surgeon: Jettie Booze, MD;  Location: [place] [facility] LAB;  Service: Cardiovascular: 99%  thrombotic dCx -> asp thrombectomy & PCI. RCA Stents Patent.   Marland Kitchen CARDIAC CATHETERIZATION N/A 06/16/2015   Procedure: Coronary Stent Intervention;  Surgeon: Jettie Booze, MD;  Location: [place] [facility];  Service: Cardiovascular: PCI dCx = thrombectomy -> 3.0 x 15 Xience drug-eluting stent (3.6 mm)   . COLONOSCOPY  2008   [id number] diverticula, diminutive polyp in the cecum, s/p bx Normal rectum. Tubular adenoma  . COLONOSCOPY N/A 10/17/2012   Dr. Gala Romney: colonic diverticulosis, tubular adenoma, surveillance due May 2019  . COLONOSCOPY N/A 02/21/2018   Procedure: COLONOSCOPY;  Surgeon: Daneil Dolin, MD;  [facility];  Service: Endoscopy;  Laterality: N/A;  12:00  . CORONARY STENT PLACEMENT  11/23/12   Mid RCA -- Xience eXp - 2.75 mm x 18 mm DES (3.81mm) , Dr. Ellyn Hack  . LEFT HEART CATHETERIZATION WITH CORONARY ANGIOGRAM N/A 11/23/2012   Procedure: LEFT HEART CATHETERIZATION WITH CORONARY ANGIOGRAM;  Surgeon: Leonie Man, MD;  Location: [facility] C[facility] LAB;  Service: Cardiovascular: NSTEMI: Culprit = mid RCA 95% & 75%; LAD ~40-50%, Small RI ~60%; EF ~60%, mild inf-basal HK     . TRANSTHORACIC ECHOCARDIOGRAM  January 2017; March 2017   a. EF 35-40%. Entire inferior hypokinesis and akinesis of the basal and mid inferolateral and anterolateral /distal lateral walls;; b. EF 40 and 45%. Akinesis of basal-mid inferolateral wall. Hypokinesis of entire lateral wall.        Home Medications    Prior to Admission medications   Medication Sig Start Date End Date Taking? Authorizing Provider  aspirin EC 81 MG tablet Take 81 mg by mouth daily.   Yes [provider]  cholecalciferol (VITAMIN D) 1000 units tablet Take 1,000 Units by mouth once a week.   Yes [provider]  clopidogrel (PLAVIX) 75 MG tablet Take 75 mg by mouth daily. 03/30/19  Yes [provider]  furosemide (LASIX) 20 MG tablet Take 1 tablet (20 mg total) by mouth daily. 04/15/19  Yes Gosrani,  Nimish C, MD  lisinopril (ZESTRIL) 5 MG tablet Take 1 tablet (5 mg total) by mouth daily. 04/15/19  Yes Gosrani, Nimish C, MD  pantoprazole (PROTONIX) 40 MG tablet TAKE ONE TABLET BY MOUTH 30 MINUTES BEFORE A MEAL ONCE OR TWICE DAILY Patient taking differently: Take 40 mg by mouth 2 (two) times daily before a meal. 30 MINUTES BEFORE A MEAL ONCE OR TWICE DAILY 03/20/19  Yes Erenest Rasher, PA-C  phenazopyridine (PYRIDIUM) 200 MG tablet Take 1 tablet (200 mg total) by mouth 3 (three) times daily as needed for pain. 05/10/19  Yes Wurst, [place], PA-C  simvastatin (ZOCOR) 40 MG tablet TAKE 1 TABLET BY MOUTH EVERY DAY Patient taking differently: Take 40 mg by mouth daily at 6 PM.  04/28/19  Yes Gosrani, Nimish C, MD  tamsulosin (FLOMAX) 0.4 MG CAPS capsule Take 1 capsule (0.4 mg total) by mouth daily after breakfast. 05/10/19  Yes Wurst, [place], PA-C  lisinopril (PRINIVIL,ZESTRIL) 2.5 MG tablet Take 1 tablet (2.5 mg total) by mouth daily. 06/18/15   Almyra Deforest, PA  nitroGLYCERIN (  NITROSTAT) 0.4 MG SL tablet Place 1 tablet (0.4 mg total) under the tongue every 5 (five) minutes as needed for chest pain. Patient not taking: Reported on 05/14/2018 09/30/15 11/29/18  Leonie Man, MD  simvastatin (ZOCOR) 40 MG tablet Take 1 tablet (40 mg total) by mouth every other day. NEED OV. 08/15/18   Leonie Man, MD    Family History Family History  Problem Relation Age of Onset  . Diabetes Mother   . Cancer Father   . Colon cancer Neg Hx     Social History Social History   Tobacco Use  . Smoking status: Former Smoker    Packs/day: 0.25    Years: 2.00    Pack years: 0.50    Types: Cigarettes    Quit date: 06/07/1963    Years since quitting: 55.9  . Smokeless tobacco: Never Used  Substance Use Topics  . Alcohol use: No    Alcohol/week: 0.0 standard drinks    Comment: Occasionally drinks red wine  . Drug use: No     Allergies   Patient has no known allergies.   Review of Systems Review of  Systems  Unable to perform ROS: Mental status change    Physical Exam Updated Vital Signs BP (!) 102/50   Pulse 65   Temp (!) 101.6 F (38.7 C) (Rectal)   Resp (!) 22   Wt 68.5 kg   SpO2 98%   BMI 22.30 kg/m   Physical Exam Constitutional:      General: He is not in acute distress.    Appearance: Normal appearance. He is well-developed. He is not ill-appearing or diaphoretic.  HENT:     Head: Normocephalic and atraumatic. No raccoon eyes or Battle's sign.     Jaw: There is normal jaw occlusion. No trismus.     Right Ear: External ear normal.     Left Ear: External ear normal.     Nose: Nose normal.     Mouth/Throat:     Mouth: Mucous membranes are moist.     Pharynx: Oropharynx is clear.  Eyes:     General: Vision grossly intact. Gaze aligned appropriately.     Extraocular Movements: Extraocular movements intact.     Conjunctiva/sclera: Conjunctivae normal.     Pupils: Pupils are equal, round, and reactive to light.  Neck:     Musculoskeletal: Full passive range of motion without pain, normal range of motion and neck supple.     Trachea: Trachea and phonation normal. No tracheal tenderness or tracheal deviation.  Cardiovascular:     Rate and Rhythm: Normal rate and regular rhythm.     Pulses:          Dorsalis pedis pulses are 2+ on the right side and 2+ on the left side.     Heart sounds: Normal heart sounds.  Pulmonary:     Effort: Pulmonary effort is normal. No respiratory distress.     Breath sounds: Normal air entry.     Comments: Coarse breath sounds bilaterally Chest:     Chest wall: No deformity or tenderness.  Abdominal:     General: There is no distension.     Palpations: Abdomen is soft.     Tenderness: There is no abdominal tenderness. There is no guarding or rebound.  Musculoskeletal: Normal range of motion.     Comments: No midline C/T/L spinal tenderness to palpation, no paraspinal muscle tenderness, no deformity, crepitus, or step-off noted. No  sign of injury to the  neck or back. - Hips stable to compression bilaterally without pain.  Patient is able to bring bilateral knees toward chest pain.  Feet:     Right foot:     Protective Sensation: 3 sites tested. 3 sites sensed.     Left foot:     Protective Sensation: 3 sites tested. 3 sites sensed.  Skin:    General: Skin is warm and dry.  Neurological:     Mental Status: He is alert.     GCS: GCS eye subscore is 4. GCS verbal subscore is 5. GCS motor subscore is 6.     Comments: Speech is clear, follows commands Major Cranial nerves without deficit, no facial droop Moves extremities without ataxia, coordination intact  Psychiatric:        Behavior: Behavior normal.    ED Treatments / Results  Labs (all labs ordered are listed, but only abnormal results are displayed) Labs Reviewed  COMPREHENSIVE METABOLIC PANEL - Abnormal; Notable for the following components:      Result Value   Sodium 133 (*)    Potassium 3.4 (*)    CO2 20 (*)    Glucose, Bld 130 (*)    BUN 28 (*)    Creatinine, Ser 1.70 (*)    Calcium 7.8 (*)    Total Protein 6.0 (*)    Albumin 2.7 (*)    GFR calc non Af Amer 39 (*)    GFR calc Af Amer 45 (*)    All other components within normal limits  CBC WITH DIFFERENTIAL/PLATELET - Abnormal; Notable for the following components:   WBC 39.2 (*)    RBC 4.03 (*)    Hemoglobin 12.0 (*)    HCT 38.6 (*)    Platelets 139 (*)    Lymphs Abs 32.5 (*)    Abs Immature Granulocytes 0.30 (*)    All other components within normal limits  URINALYSIS, ROUTINE W REFLEX MICROSCOPIC - Abnormal; Notable for the following components:   Color, Urine AMBER (*)    APPearance HAZY (*)    Hgb urine dipstick LARGE (*)    Protein, ur 100 (*)    Nitrite POSITIVE (*)    RBC / HPF >50 (*)    All other components within normal limits  D-DIMER, QUANTITATIVE (NOT AT [facility]) - Abnormal; Notable for the following components:   D-Dimer, Quant 4.33 (*)    All other components within  normal limits  FERRITIN - Abnormal; Notable for the following components:   Ferritin 843 (*)    All other components within normal limits  FIBRINOGEN - Abnormal; Notable for the following components:   Fibrinogen 605 (*)    All other components within normal limits  C-REACTIVE PROTEIN - Abnormal; Notable for the following components:   CRP 18.5 (*)    All other components within normal limits  POC SARS CORONAVIRUS 2 AG -  ED - Abnormal; Notable for the following components:   SARS Coronavirus 2 Ag POSITIVE (*)    All other components within normal limits  CULTURE, BLOOD (ROUTINE X 2)  CULTURE, BLOOD (ROUTINE X 2)  URINE CULTURE  LACTIC ACID, PLASMA  APTT  PROTIME-INR  PROCALCITONIN  LACTATE DEHYDROGENASE  TRIGLYCERIDES    EKG None  Radiology Ct Head Wo Contrast  Result Date: 05/15/2019 CLINICAL DATA:  Trauma EXAM: CT HEAD WITHOUT CONTRAST CT CERVICAL SPINE WITHOUT CONTRAST TECHNIQUE: Multidetector CT imaging of the head and cervical spine was performed following the standard protocol without intravenous contrast. Multiplanar  CT image reconstructions of the cervical spine were also generated. COMPARISON:  None. FINDINGS: CT HEAD FINDINGS Brain: There is no acute intracranial hemorrhage, mass effect or edema. Gray-white differentiation is preserved. Ventricles and sulci are within limits in size and configuration. Patchy and confluent hypoattenuation in the supratentorial white matter is nonspecific but probably reflects moderate chronic microvascular ischemic changes. There is no extra-axial fluid collection. Vascular: There is intracranial atherosclerotic calcification at the skull base. Skull: Unremarkable. Sinuses/Orbits: Moderate ethmoid mucosal thickening. Orbits are unremarkable. Other: Mild patchy mastoid opacification CT CERVICAL SPINE FINDINGS Alignment: Anteroposterior alignment is maintained. Skull base and vertebrae: No acute fracture. Vertebral body heights are preserved.  Soft tissues and spinal canal: No prevertebral fluid or swelling. No visible canal hematoma. Disc levels: Mild degenerative changes are present without high-grade osseous encroachment on the spinal canal. Upper chest: Partially imaged right upper lobe opacity. Other: None IMPRESSION: No evidence of acute intracranial injury or acute cervical spine fracture. Moderate chronic microvascular ischemic changes. Partially imaged right upper lobe opacity. Electronically Signed   By: Macy Mis M.D.   On: 05/15/2019 16:45   Ct Cervical Spine Wo Contrast  Result Date: 05/15/2019 CLINICAL DATA:  Trauma EXAM: CT HEAD WITHOUT CONTRAST CT CERVICAL SPINE WITHOUT CONTRAST TECHNIQUE: Multidetector CT imaging of the head and cervical spine was performed following the standard protocol without intravenous contrast. Multiplanar CT image reconstructions of the cervical spine were also generated. COMPARISON:  None. FINDINGS: CT HEAD FINDINGS Brain: There is no acute intracranial hemorrhage, mass effect or edema. Gray-white differentiation is preserved. Ventricles and sulci are within limits in size and configuration. Patchy and confluent hypoattenuation in the supratentorial white matter is nonspecific but probably reflects moderate chronic microvascular ischemic changes. There is no extra-axial fluid collection. Vascular: There is intracranial atherosclerotic calcification at the skull base. Skull: Unremarkable. Sinuses/Orbits: Moderate ethmoid mucosal thickening. Orbits are unremarkable. Other: Mild patchy mastoid opacification CT CERVICAL SPINE FINDINGS Alignment: Anteroposterior alignment is maintained. Skull base and vertebrae: No acute fracture. Vertebral body heights are preserved. Soft tissues and spinal canal: No prevertebral fluid or swelling. No visible canal hematoma. Disc levels: Mild degenerative changes are present without high-grade osseous encroachment on the spinal canal. Upper chest: Partially imaged right  upper lobe opacity. Other: None IMPRESSION: No evidence of acute intracranial injury or acute cervical spine fracture. Moderate chronic microvascular ischemic changes. Partially imaged right upper lobe opacity. Electronically Signed   By: Macy Mis M.D.   On: 05/15/2019 16:45   Dg Pelvis Portable  Result Date: 05/15/2019 CLINICAL DATA:  Cough, sepsis, UTI EXAM: PORTABLE PELVIS 1-2 VIEWS COMPARISON:  Portable exam 1408 hours without priors for comparison FINDINGS: Osseous demineralization. Hip and SI joint spaces preserved. No acute fracture, dislocation, or bone destruction. Scattered atherosclerotic calcifications at the femoral arteries. IMPRESSION: No acute abnormalities. Electronically Signed   By: Lavonia Dana M.D.   On: 05/15/2019 14:27   Dg Chest Port 1 View  Result Date: 05/15/2019 CLINICAL DATA:  Cough, sepsis, UTI, coronary disease post MI, ischemic cardiomyopathy, CLL, hypertension EXAM: PORTABLE CHEST 1 VIEW COMPARISON:  Portable exam 1410 hours compared to 04/05/2018 FINDINGS: Normal heart size and mediastinal contours. Minimal patchy opacities in the mid to lower lungs question subtle infiltrates. No pleural effusion or pneumothorax. Bones demineralized. IMPRESSION: Patchy opacities in the mid to lower lungs bilaterally suspicious for subtle pulmonary infiltrates. Electronically Signed   By: Lavonia Dana M.D.   On: 05/15/2019 14:26    Procedures .Critical Care Performed by: Nuala Alpha  A, PA-C Authorized by: Deliah Boston, PA-C   Critical care provider statement:    Critical care time (minutes):  48   Critical care was necessary to treat or prevent imminent or life-threatening deterioration of the following conditions:  Respiratory failure and sepsis (Sepsis, COVID-19, hypoxia requiring supplemental oxygen)   Critical care was time spent personally by me on the following activities:  Discussions with consultants, evaluation of patient's response to treatment, examination  of patient, ordering and performing treatments and interventions, ordering and review of laboratory studies, ordering and review of radiographic studies, pulse oximetry, re-evaluation of patient's condition, obtaining history from patient or surrogate, review of old charts and development of treatment plan with patient or surrogate   (including critical care time)  Medications Ordered in ED Medications  cefTRIAXone (ROCEPHIN) 1 g in sodium chloride 0.9 % 100 mL IVPB (0 g Intravenous Stopped 05/15/19 1440)  azithromycin (ZITHROMAX) 500 mg in sodium chloride 0.9 % 250 mL IVPB (500 mg Intravenous Not Given 05/15/19 1447)  azithromycin (ZITHROMAX) 500 mg in sodium chloride 0.9 % 250 mL IVPB (0 mg Intravenous Stopped 05/15/19 1619)  dexamethasone (DECADRON) injection 4 mg (has no administration in time range)  remdesivir 200 mg in sodium chloride 0.9% 250 mL IVPB (has no administration in time range)    Followed by  remdesivir 100 mg in sodium chloride 0.9 % 100 mL IVPB (has no administration in time range)  sodium chloride 0.9 % bolus 1,000 mL (0 mLs Intravenous Stopped 05/15/19 1454)  dexamethasone (DECADRON) injection 6 mg (6 mg Intravenous Given 05/15/19 1454)  sodium chloride 0.9 % bolus 500 mL (500 mLs Intravenous New Bag/Given 05/15/19 1638)     Initial Impression / Assessment and Plan / ED Course  I have reviewed the triage vital signs and the nursing notes.  Pertinent labs & imaging results that were available during my care of the patient were reviewed by me and considered in my medical decision making (see chart for details).  Clinical Course as of May 14 1724  Wed May 15, 2019  1700 Dr. Jamse Arn   [BM]    Clinical Course User Index [BM] Deliah Boston, PA-C   73 year old male arrives to the ED febrile, hypotensive, tachypneic, not tachycardic.  Hypoxia improved with submental oxygen.  Code sepsis was initiated with empiric coverage for suspected UTI as patient with several days  of dysuria.  However patient does have new productive cough, hypoxia and coarse lung sounds will add rapid Covid test and obtain chest x-ray.  He reports falling over the past several days he has no outward signs of injury on exam but will obtain CT head and C-spine as he is altered and on Plavix.  Chart review shows most recent echocardiogram on 08/06/2015 with EF of 40-45%.  We will need to carefully fluid bolus to avoid overload, initial 1 L fluid bolus ordered will monitor.  Patient is pleasantly altered today moving bilateral extremities with equal strength, no cranial neuro deficit, low suspicion for CVA at this time. - Covid positive CBC with extensiveness of 39.2 with left shift CMP with mild elevation of creatinine at 1.7 above baseline, nonacute PT/INR within normal limits APTT within normal limits Urinalysis with blood, nitrates, protein Lactic 1.1 Chest x-ray:  IMPRESSION:  Patchy opacities in the mid to lower lungs bilaterally suspicious  for subtle pulmonary infiltrates.   DG Pelvis:    IMPRESSION:  No acute abnormalities.   CT Head/C-spine:  IMPRESSION:  No evidence of acute  intracranial injury or acute cervical spine  fracture.    Moderate chronic microvascular ischemic changes.    Partially imaged right upper lobe opacity.  - Blood pressure unchanged after first liter of fluid, on reassessment he is resting comfortably and in no acute distress.  Additional 500 mL fluid ordered with improvement of blood pressure.  Covid order set utilized, 6 mg Decadron given, antibiotics broadened for coverage of community-acquired pneumonia with addition of azithromycin.  O2 saturation greater than 95% on 4 L nasal cannula.  Suspect patient's altered mental status today secondary to infection, will admit to hospitalist service. - Discussed case with Dr. Jamse Arn from hospitalist service, has asked for an additional 4 mg Decadron, coming to see patient for admission.  RONNI DERKSEN  was evaluated in Emergency Department on 05/15/2019 for the symptoms described in the history of present illness. He was evaluated in the context of the global COVID-19 pandemic, which necessitated consideration that the patient might be at risk for infection with the SARS-CoV-2 virus that causes COVID-19. Institutional protocols and algorithms that pertain to the evaluation of patients at risk for COVID-19 are in a state of rapid change based on information released by regulatory bodies including the CDC and federal and state organizations. These policies and algorithms were followed during the patient's care in the ED.  Patient was seen and evaluated by Dr. Reather Converse during this visit.  Note: Portions of this report may have been transcribed using voice recognition software. Every effort was made to ensure accuracy; however, inadvertent computerized transcription errors may still be present. Final Clinical Impressions(s) / ED Diagnoses   Final diagnoses:  Sepsis, due to unspecified organism, unspecified whether acute organ dysfunction present [facility])  Disorientation  COVID-19 virus detected  Community acquired pneumonia, unspecified laterality    ED Discharge Orders    None       Gari Crown 05/15/19 1725    Elnora Morrison, MD 05/19/19 [id number]

## 2019-05-15 NOTE — ED Notes (Signed)
Hospitalist at bedside 

## 2019-05-15 NOTE — ED Notes (Signed)
Pt to CT

## 2019-05-15 NOTE — ED Triage Notes (Signed)
Pt recently treated for UTI, given ABX. Still c/o of dysuria, dark colored and cloudy urine.  1000 of Tylenol, zofran, 1 g rocephin given to pt via EMS

## 2019-05-15 NOTE — H&P (Signed)
History and Physical:    [name]   [id number] DOB: 08-02-1945 DOA: 05/15/2019  Referring MD/provider: PA Herb Grays PCP: [name], MD   Patient coming from: Home  Chief Complaint: "Not feeling well".  Is a poor historian however history is entirely per patient and ED documentation.  History of Present Illness:   [name] is an 73 y.o. male with past medical history significant for CLL, CAD, BPH and HFrEF who was apparently not feeling very well for the past 4 to 5 days.  Patient is unable to specify exactly how he was not well but stated he was tired.  Patient also admits that he has been seen for difficulty urinating last week but those symptoms have not improved despite taking the medications which she was prescribed (Pyridium and Flomax).  Patient is not sure why he ended up in the hospital but he states that "my wife must of been worried about me I guess".  Patient does admit to feeling tired, he is a little confused, he thinks he may be a little short of breath.  Patient denies chest pain or palpitations.  Patient denies any falls.  Patient thinks he might be dizzy but is not sure.  Of note patient was seen last week for dysuria and UA was negative at that time.  He was treated with Pyridium and Flomax with instructions to follow-up with urology.  ED Course:  The patient was noted to meet criteria for sepsis with fever, tachypnea, tachycardia and leukocytosis.  Patient was noted to have bilateral lower lobe pneumonias and significant hypoxia with 75% O2 sats on room air.  Patient was given nasal cannula 4 L oxygen with improvement to O2 sats in the 90s with decreased work of breathing.  He was also treated for community-acquired pneumonia with ceftriaxone and azithromycin.  Covid test subsequently came back positive.  Inflammatory markers are quite elevated with CRP of 18.5 although procalcitonin is also elevated at 1.12. Patient is now admitted for Covid  versus community-acquired pneumonia.   ROS:   ROS   Review of Systems: States that he is too tired to give me a complete review of systems.  He admits to some diarrhea as not to have any times a day he is moving his bowels states that he had loose stools for 2 to 3 days.  Does have dysuria as noted above for 2 to 3 weeks.  He has fatigue and shortness of breath for the past 4 to 5 days he believes.  Patient denies any loss of appetite.  Denies any alteration in taste or smell.  Patient  Past Medical History:   Past Medical History:  Diagnosis Date  . Acute MI, inferolateral wall, initial episode of care ([place]) 06/16/15   99% dCx   . BPH (benign prostatic hyperplasia) 04/08/2011  . CAD S/P percutaneous coronary angioplasty 11/23/12; 06/16/15   a. 2 overlapping mRCA lesions - Xience Xpedition DES 2.75 mm x 18 mm (3.0 mm);; b. Inf-Lat STEMI 06/16/2015 - dCx Asp Thrombectomy & PCI 3.0 x 15 Xience drDES (3.6 mm)  . Chronic headache   . CLL (chronic lymphocytic leukemia) ([place]) 04/08/2011  . Depression   . Diverticulitis   . Gallstone   . GERD (gastroesophageal reflux disease)   . Hypercholesteremia   . Ischemic cardiomyopathy    Echo 06/18/2015 EF 35-40% after lateral MI -> repeat echo 08/06/2015: EF 40-45% with basal-mid inferolateral akinesis and hypokinesis of the lateral wall.  Marland Kitchen  NSTEMI (non-ST elevated myocardial infarction) ([place]) 11/23/12   NSTEMI 11/23/2012 RCA lesion - PCI with DES; moderate LAD disease  . PTSD (post-traumatic stress disorder)    With Depression - since MI    Past Surgical History:   Past Surgical History:  Procedure Laterality Date  . APPENDECTOMY  1968-70  . CARDIAC CATHETERIZATION N/A 06/16/2015   Procedure: Left Heart Cath and Coronary Angiography;  Surgeon: Jettie Booze, MD;  Location: [place] [facility];  Service: Cardiovascular: 99% thrombotic dCx -> asp thrombectomy & PCI. RCA Stents Patent.   Marland Kitchen CARDIAC CATHETERIZATION N/A 06/16/2015   Procedure:  Coronary Stent Intervention;  Surgeon: Jettie Booze, MD;  Location: [place] [facility];  Service: Cardiovascular: PCI dCx = thrombectomy -> 3.0 x 15 Xience drug-eluting stent (3.6 mm)   . COLONOSCOPY  2008   [id number] diverticula, diminutive polyp in the cecum, s/p bx Normal rectum. Tubular adenoma  . COLONOSCOPY N/A 10/17/2012   Dr. Gala Romney: colonic diverticulosis, tubular adenoma, surveillance due May 2019  . COLONOSCOPY N/A 02/21/2018   Procedure: COLONOSCOPY;  Surgeon: Daneil Dolin, MD;  [facility]: [facility];  Service: Endoscopy;  Laterality: N/A;  12:00  . CORONARY STENT PLACEMENT  11/23/12   Mid RCA -- Xience eXp - 2.75 mm x 18 mm DES (3.78mm) , Dr. Ellyn Hack  . LEFT HEART CATHETERIZATION WITH CORONARY ANGIOGRAM N/A 11/23/2012   Procedure: LEFT HEART CATHETERIZATION WITH CORONARY ANGIOGRAM;  Surgeon: Leonie Man, MD;  Location: [facility];  Service: Cardiovascular: NSTEMI: Culprit = mid RCA 95% & 75%; LAD ~40-50%, Small RI ~60%; EF ~60%, mild inf-basal HK     . TRANSTHORACIC ECHOCARDIOGRAM  January 2017; March 2017   a. EF 35-40%. Entire inferior hypokinesis and akinesis of the basal and mid inferolateral and anterolateral /distal lateral walls;; b. EF 40 and 45%. Akinesis of basal-mid inferolateral wall. Hypokinesis of entire lateral wall.    Social History:   Social History   Socioeconomic History  . Marital status: Married    Spouse name: Not on file  . Number of children: Not on file  . Years of education: Not on file  . Highest education level: Not on file  Occupational History  . Not on file  Social Needs  . Financial resource strain: Not on file  . Food insecurity    Worry: Not on file    Inability: Not on file  . Transportation needs    Medical: Not on file    Non-medical: Not on file  Tobacco Use  . Smoking status: Former Smoker    Packs/day: 0.25    Years: 2.00    Pack years: 0.50    Types: Cigarettes    Quit date: 06/07/1963    Years since  quitting: 55.9  . Smokeless tobacco: Never Used  Substance and Sexual Activity  . Alcohol use: No    Alcohol/week: 0.0 standard drinks    Comment: Occasionally drinks red wine  . Drug use: No  . Sexual activity: Not on file  Lifestyle  . Physical activity    Days per week: Not on file    Minutes per session: Not on file  . Stress: Not on file  Relationships  . Social Herbalist on phone: Not on file    Gets together: Not on file    Attends religious service: Not on file    Active member of club or organization: Not on file    Attends meetings of clubs  or organizations: Not on file    Relationship status: Not on file  . Intimate partner violence    Fear of current or ex partner: Not on file    Emotionally abused: Not on file    Physically abused: Not on file    Forced sexual activity: Not on file  Other Topics Concern  . Not on file  Social History Narrative   Exercises 3 to 4 days a week at the fitness center for about a half an hour at a time.   "former smoker". Does not drink EtOH   Married.    Allergies   Patient has no known allergies.  Family history:   Family History  Problem Relation Age of Onset  . Diabetes Mother   . Cancer Father   . Colon cancer Neg Hx     Current Medications:   Prior to Admission medications   Medication Sig Start Date End Date Taking? Authorizing Provider  aspirin EC 81 MG tablet Take 81 mg by mouth daily.   Yes [provider]  cholecalciferol (VITAMIN D) 1000 units tablet Take 1,000 Units by mouth once a week.   Yes [provider]  clopidogrel (PLAVIX) 75 MG tablet Take 75 mg by mouth daily. 03/30/19  Yes [provider]  furosemide (LASIX) 20 MG tablet Take 1 tablet (20 mg total) by mouth daily. 04/15/19  Yes Gosrani, Nimish C, MD  lisinopril (ZESTRIL) 5 MG tablet Take 1 tablet (5 mg total) by mouth daily. 04/15/19  Yes Gosrani, Nimish C, MD  pantoprazole (PROTONIX) 40 MG tablet TAKE ONE  TABLET BY MOUTH 30 MINUTES BEFORE A MEAL ONCE OR TWICE DAILY Patient taking differently: Take 40 mg by mouth 2 (two) times daily before a meal. 30 MINUTES BEFORE A MEAL ONCE OR TWICE DAILY 03/20/19  Yes Erenest Rasher, PA-C  phenazopyridine (PYRIDIUM) 200 MG tablet Take 1 tablet (200 mg total) by mouth 3 (three) times daily as needed for pain. 05/10/19  Yes Wurst, [place], PA-C  simvastatin (ZOCOR) 40 MG tablet TAKE 1 TABLET BY MOUTH EVERY DAY Patient taking differently: Take 40 mg by mouth daily at 6 PM.  04/28/19  Yes Gosrani, Nimish C, MD  tamsulosin (FLOMAX) 0.4 MG CAPS capsule Take 1 capsule (0.4 mg total) by mouth daily after breakfast. 05/10/19  Yes Wurst, [place], PA-C  lisinopril (PRINIVIL,ZESTRIL) 2.5 MG tablet Take 1 tablet (2.5 mg total) by mouth daily. 06/18/15   Almyra Deforest, PA  nitroGLYCERIN (NITROSTAT) 0.4 MG SL tablet Place 1 tablet (0.4 mg total) under the tongue every 5 (five) minutes as needed for chest pain. Patient not taking: Reported on 05/14/2018 09/30/15 11/29/18  Leonie Man, MD  simvastatin (ZOCOR) 40 MG tablet Take 1 tablet (40 mg total) by mouth every other day. NEED OV. 08/15/18   Leonie Man, MD    Physical Exam:   Vitals:   05/15/19 1400 05/15/19 1430 05/15/19 1530 05/15/19 1630  BP: (!) 98/52 (!) 96/49 (!) 90/55 (!) 102/50  Pulse: 83 72 68 65  Resp: (!) 21 (!) 24 (!) 24 (!) 22  Temp:      TempSrc:      SpO2: 93% 94% 95% 98%  Weight:         Physical Exam: Blood pressure (!) 102/50, pulse 65, temperature (!) 101.6 F (38.7 C), temperature source Rectal, resp. rate (!) 22, weight 68.5 kg, SpO2 98 %. Gen: Weak and tired appearing man lying in stretcher sleeping with his mouth open  and no mask on with O2 saturations 97% with nasal cannula. Eyes: Sclerae anicteric. Conjunctiva mildly injected. Chest: Decreased air entry bilaterally most likely secondary to decreased inspiratory effort.  He does have rales at bases bilaterally.  Coarse breath sounds.     CV: Distant, regular, no audible murmurs. Abdomen: NABS, soft, nondistended, nontender. No tenderness to light or deep palpation. No rebound, no guarding. Extremities: No edema.  Skin: Warm and dry. No rashes, lesions or wounds. Neuro: Patient is sleepy but easily arousable by voice alone.  He is oriented to person and place not entirely clear of the date.  He attempts to answer questions appropriately but is vague and not clear about dates and details.    Data Review:    Labs: Basic Metabolic Panel: Recent Labs  Lab 05/15/19 1359  NA 133*  K 3.4*  CL 100  CO2 20*  GLUCOSE 130*  BUN 28*  CREATININE 1.70*  CALCIUM 7.8*   Liver Function Tests: Recent Labs  Lab 05/15/19 1359  AST 36  ALT 13  ALKPHOS 51  BILITOT 1.1  PROT 6.0*  ALBUMIN 2.7*   No results for input(s): LIPASE, AMYLASE in the last 168 hours. No results for input(s): AMMONIA in the last 168 hours. CBC: Recent Labs  Lab 05/15/19 1359  WBC 39.2*  NEUTROABS 6.2  HGB 12.0*  HCT 38.6*  MCV 95.8  PLT 139*   Cardiac Enzymes: No results for input(s): CKTOTAL, CKMB, CKMBINDEX, TROPONINI in the last 168 hours.  BNP (last 3 results) No results for input(s): PROBNP in the last 8760 hours. CBG: No results for input(s): GLUCAP in the last 168 hours.  Urinalysis    Component Value Date/Time   COLORURINE AMBER (A) 05/15/2019 1352   APPEARANCEUR HAZY (A) 05/15/2019 1352   LABSPEC 1.019 05/15/2019 1352   PHURINE 5.0 05/15/2019 1352   GLUCOSEU NEGATIVE 05/15/2019 1352   HGBUR LARGE (A) 05/15/2019 1352   BILIRUBINUR NEGATIVE 05/15/2019 1352   BILIRUBINUR negative 05/10/2019 1428   KETONESUR NEGATIVE 05/15/2019 1352   PROTEINUR 100 (A) 05/15/2019 1352   UROBILINOGEN 0.2 05/10/2019 1428   NITRITE POSITIVE (A) 05/15/2019 1352   LEUKOCYTESUR NEGATIVE 05/15/2019 1352      Radiographic Studies: Ct Head Wo Contrast  Result Date: 05/15/2019 CLINICAL DATA:  Trauma EXAM: CT HEAD WITHOUT CONTRAST CT CERVICAL  SPINE WITHOUT CONTRAST TECHNIQUE: Multidetector CT imaging of the head and cervical spine was performed following the standard protocol without intravenous contrast. Multiplanar CT image reconstructions of the cervical spine were also generated. COMPARISON:  None. FINDINGS: CT HEAD FINDINGS Brain: There is no acute intracranial hemorrhage, mass effect or edema. Gray-white differentiation is preserved. Ventricles and sulci are within limits in size and configuration. Patchy and confluent hypoattenuation in the supratentorial white matter is nonspecific but probably reflects moderate chronic microvascular ischemic changes. There is no extra-axial fluid collection. Vascular: There is intracranial atherosclerotic calcification at the skull base. Skull: Unremarkable. Sinuses/Orbits: Moderate ethmoid mucosal thickening. Orbits are unremarkable. Other: Mild patchy mastoid opacification CT CERVICAL SPINE FINDINGS Alignment: Anteroposterior alignment is maintained. Skull base and vertebrae: No acute fracture. Vertebral body heights are preserved. Soft tissues and spinal canal: No prevertebral fluid or swelling. No visible canal hematoma. Disc levels: Mild degenerative changes are present without high-grade osseous encroachment on the spinal canal. Upper chest: Partially imaged right upper lobe opacity. Other: None IMPRESSION: No evidence of acute intracranial injury or acute cervical spine fracture. Moderate chronic microvascular ischemic changes. Partially imaged right upper lobe opacity. Electronically  Signed   By: Macy Mis M.D.   On: 05/15/2019 16:45   Ct Cervical Spine Wo Contrast  Result Date: 05/15/2019 CLINICAL DATA:  Trauma EXAM: CT HEAD WITHOUT CONTRAST CT CERVICAL SPINE WITHOUT CONTRAST TECHNIQUE: Multidetector CT imaging of the head and cervical spine was performed following the standard protocol without intravenous contrast. Multiplanar CT image reconstructions of the cervical spine were also generated.  COMPARISON:  None. FINDINGS: CT HEAD FINDINGS Brain: There is no acute intracranial hemorrhage, mass effect or edema. Gray-white differentiation is preserved. Ventricles and sulci are within limits in size and configuration. Patchy and confluent hypoattenuation in the supratentorial white matter is nonspecific but probably reflects moderate chronic microvascular ischemic changes. There is no extra-axial fluid collection. Vascular: There is intracranial atherosclerotic calcification at the skull base. Skull: Unremarkable. Sinuses/Orbits: Moderate ethmoid mucosal thickening. Orbits are unremarkable. Other: Mild patchy mastoid opacification CT CERVICAL SPINE FINDINGS Alignment: Anteroposterior alignment is maintained. Skull base and vertebrae: No acute fracture. Vertebral body heights are preserved. Soft tissues and spinal canal: No prevertebral fluid or swelling. No visible canal hematoma. Disc levels: Mild degenerative changes are present without high-grade osseous encroachment on the spinal canal. Upper chest: Partially imaged right upper lobe opacity. Other: None IMPRESSION: No evidence of acute intracranial injury or acute cervical spine fracture. Moderate chronic microvascular ischemic changes. Partially imaged right upper lobe opacity. Electronically Signed   By: Macy Mis M.D.   On: 05/15/2019 16:45   Dg Pelvis Portable  Result Date: 05/15/2019 CLINICAL DATA:  Cough, sepsis, UTI EXAM: PORTABLE PELVIS 1-2 VIEWS COMPARISON:  Portable exam 1408 hours without priors for comparison FINDINGS: Osseous demineralization. Hip and SI joint spaces preserved. No acute fracture, dislocation, or bone destruction. Scattered atherosclerotic calcifications at the femoral arteries. IMPRESSION: No acute abnormalities. Electronically Signed   By: Lavonia Dana M.D.   On: 05/15/2019 14:27   Dg Chest Port 1 View  Result Date: 05/15/2019 CLINICAL DATA:  Cough, sepsis, UTI, coronary disease post MI, ischemic cardiomyopathy,  CLL, hypertension EXAM: PORTABLE CHEST 1 VIEW COMPARISON:  Portable exam 1410 hours compared to 04/05/2018 FINDINGS: Normal heart size and mediastinal contours. Minimal patchy opacities in the mid to lower lungs question subtle infiltrates. No pleural effusion or pneumothorax. Bones demineralized. IMPRESSION: Patchy opacities in the mid to lower lungs bilaterally suspicious for subtle pulmonary infiltrates. Electronically Signed   By: Lavonia Dana M.D.   On: 05/15/2019 14:26    EKG: Independently reviewed.  NSR 80.  Left axis deviation at -20.  Q waves in 3 and F, V1, V5 and V6.  T wave inversions in 3.  No acute ST-T wave changes.   Assessment/Plan:   Principal Problem:   Sepsis ([place]) Active Problems:   CLL (chronic lymphocytic leukemia) ([facility])   BPH (benign prostatic hyperplasia)   CAD S/P PCI-- RCA DES 2014,  CFX DES Jan 2017   PTSD (post-traumatic stress disorder)   Benign essential HTN   Ischemic cardiomyopathy   GERD (gastroesophageal reflux disease)   Esophageal dysphagia   Community acquired pneumonia  Immunocompromised 73 year old man with CLL presents with malaise fevers shortness of breath noted to have bilateral lower lobe pneumonia, Covid positive, elevated inflammatory markers and an elevated procalcitonin.    SEPSIS Immunocompromised patient with sepsis.  He has hypotension, tachypnea, tachycardia and leukocytosis with known source of infection with bilateral pneumonia.   Cause of pneumonia may be Covid versus bacterial versus both.  She has markedly elevated CRP at 18.5 as well as an elevated procalcitonin  at 1.12 Will treat with judicious IV fluid resuscitation given known ischemic cardiomyopathy with a EF of 40 to 45%. Patient received 1.5 L fluid bolus in ED, I am continuing with normal saline 125 cc an hour. Will attempt treatment for Covid with remdesivir. Will cover potential bacterial immunity acquired pneumonia with ceftriaxone and azithromycin.  COVID  INFECTION Remdesivir requested, LFTs within normal limits. Given markedly elevated inflammatory markers, sepsis and hypoxia will request Actemra however discussion with pharmacist reveals that it is relatively contraindicated given severe sepsis. Protocol is to follow CRP and if it continues to rise they will start Actemra tomorrow. Actemra pharmacy consultation requested.  CAP Even elevated procalcitonin, will continue treatment with ceftriaxone and azithromycin as initiated in the ED.  HYPONATREMIA Patient's baseline sodium is normal so I suspect this is likely secondary to recent dehydration. Repeat sodium in the morning after normal saline fluid resuscitation.  HYPOKALEMIA Potassium is 3.4 will give K. Dur 40 mEq p.o. x1 and recheck in the morning.  ABNORMAL UA Patient has been complaining of dysuria for the past couple of weeks.  He was seen in the ED last week and treated symptomatically with Pyridium and Flomax.Marland Kitchen At present patient has positive nitrites but leukoesterase and WBC negative, bacteria negative Positive nitrites is likely a false positive due to gross hematuria. Patient has follow-up with Dr. Noah Delaine, urologist for work-up of hematuria.  This can be done as an outpatient.  HFrEF Patient does have rales at bases however also does have infiltrates in lower lobes bilaterally. Given hypotension and sepsis, will hold Lasix 20 mg orally daily until patient is adequately fluid repleted and infection is under better control.   Will need to follow lung exam and oxygenation closely to see if we can differentiate between Covid pneumonia, bacterial pneumonia and possibly decompensated heart failure.  BPH Patient has a Foley in place, will hold tamsulosin given relatively lower blood pressures. Tamsulosin can be restarted once his Foley has been discontinued.  CKD Patient with known CKD 2, creatinine slightly worsened at 1.7 up from 1.4. Should improve with fluid  resuscitation and treatment of infections. Follow urine output and creatinine closely.  CAD Patient denies chest pain or any signs or symptoms referable to angina. Continue DAPT with aspirin and Plavix. He does not appear to be on a beta-blocker at baseline possibly secondary to baseline bradycardia. Will hold lisinopril given hypotension at present.  CLL Followed closely by Dr. Delton Coombes, WBC stable around 40  HYPOKALEMIA Mild hypokalemia with K of 3.4, will replete and recheck.  GERD Continue pantoprazole.    Other information:   DVT prophylaxis: Lovenox ordered. Code Status: Full code. Family Communication: Wife is aware patient is in house Disposition Plan: Home Consults called: None Admission status: Inpatient  The medical decision making on this patient was of high complexity and the patient is at high risk for clinical deterioration, therefore this is a level 3 visit.    Dewaine Oats Tublu Errick Salts [facility]  If 7PM-7AM, please contact night-coverage [email or web address] Password [email or web address] 05/15/2019, 5:25 PM

## 2019-05-15 NOTE — Sepsis Progress Note (Signed)
Notified provider of need to order fluid bolus.  ?

## 2019-05-16 DIAGNOSIS — A419 Sepsis, unspecified organism: Secondary | ICD-10-CM

## 2019-05-16 DIAGNOSIS — R6521 Severe sepsis with septic shock: Secondary | ICD-10-CM

## 2019-05-16 LAB — COMPREHENSIVE METABOLIC PANEL
ALT: 14 U/L (ref 0–44)
AST: 34 U/L (ref 15–41)
Albumin: 2.6 g/dL — ABNORMAL LOW (ref 3.5–5.0)
Alkaline Phosphatase: 52 U/L (ref 38–126)
Anion gap: 12 (ref 5–15)
BUN: 29 mg/dL — ABNORMAL HIGH (ref 8–23)
CO2: 20 mmol/L — ABNORMAL LOW (ref 22–32)
Calcium: 7.7 mg/dL — ABNORMAL LOW (ref 8.9–10.3)
Chloride: 104 mmol/L (ref 98–111)
Creatinine, Ser: 1.53 mg/dL — ABNORMAL HIGH (ref 0.61–1.24)
GFR calc Af Amer: 51 mL/min — ABNORMAL LOW (ref >60)
GFR calc non Af Amer: 44 mL/min — ABNORMAL LOW (ref >60)
Glucose, Bld: 176 mg/dL — ABNORMAL HIGH (ref 70–99)
Potassium: 3.8 mmol/L (ref 3.5–5.1)
Sodium: 136 mmol/L (ref 135–145)
Total Bilirubin: 0.7 mg/dL (ref 0.3–1.2)
Total Protein: 6 g/dL — ABNORMAL LOW (ref 6.5–8.1)

## 2019-05-16 LAB — CBC WITH DIFFERENTIAL/PLATELET
Abs Immature Granulocytes: 0.53 10*3/uL — ABNORMAL HIGH (ref 0.00–0.07)
Basophils Absolute: 0.1 10*3/uL (ref 0.0–0.1)
Basophils Relative: 0 %
Eosinophils Absolute: 0 10*3/uL (ref 0.0–0.5)
Eosinophils Relative: 0 %
HCT: 40 % (ref 39.0–52.0)
Hemoglobin: 12.3 g/dL — ABNORMAL LOW (ref 13.0–17.0)
Immature Granulocytes: 2 %
Lymphocytes Relative: 85 %
Lymphs Abs: 30.6 10*3/uL — ABNORMAL HIGH (ref 0.7–4.0)
MCH: 30.1 pg (ref 26.0–34.0)
MCHC: 30.8 g/dL (ref 30.0–36.0)
MCV: 98 fL (ref 80.0–100.0)
Monocytes Absolute: 0.1 10*3/uL (ref 0.1–1.0)
Monocytes Relative: 0 %
Neutro Abs: 4.9 10*3/uL (ref 1.7–7.7)
Neutrophils Relative %: 13 %
Platelets: 141 10*3/uL — ABNORMAL LOW (ref 150–400)
RBC: 4.08 MIL/uL — ABNORMAL LOW (ref 4.22–5.81)
RDW: 14.3 % (ref 11.5–15.5)
WBC: 36.1 10*3/uL — ABNORMAL HIGH (ref 4.0–10.5)
nRBC: 0 % (ref 0.0–0.2)

## 2019-05-16 LAB — C-REACTIVE PROTEIN: CRP: 19.4 mg/dL — ABNORMAL HIGH (ref <1.0)

## 2019-05-16 LAB — BRAIN NATRIURETIC PEPTIDE: B Natriuretic Peptide: 191.5 pg/mL — ABNORMAL HIGH (ref 0.0–100.0)

## 2019-05-16 LAB — FERRITIN: Ferritin: 818 ng/mL — ABNORMAL HIGH (ref 24–336)

## 2019-05-16 LAB — URINE CULTURE: Culture: NO GROWTH

## 2019-05-16 LAB — MAGNESIUM: Magnesium: 2 mg/dL (ref 1.7–2.4)

## 2019-05-16 LAB — PHOSPHORUS: Phosphorus: 3.4 mg/dL (ref 2.5–4.6)

## 2019-05-16 LAB — LACTATE DEHYDROGENASE: LDH: 193 U/L — ABNORMAL HIGH (ref 98–192)

## 2019-05-16 LAB — ABO/RH: ABO/RH(D): O POS

## 2019-05-16 MED ORDER — VITAMIN D 25 MCG (1000 UNIT) PO TABS
1000.0000 [IU] | ORAL_TABLET | Freq: Every day | ORAL | Status: DC
  Administered 2019-05-16 – 2019-05-29 (×13): 1000 [IU] via ORAL
  Filled 2019-05-16 (×13): qty 1

## 2019-05-16 MED ORDER — ZINC SULFATE 220 (50 ZN) MG PO CAPS
220.0000 mg | ORAL_CAPSULE | Freq: Every day | ORAL | Status: DC
  Administered 2019-05-16 – 2019-05-29 (×13): 220 mg via ORAL
  Filled 2019-05-16 (×14): qty 1

## 2019-05-16 MED ORDER — IPRATROPIUM BROMIDE HFA 17 MCG/ACT IN AERS
2.0000 | INHALATION_SPRAY | Freq: Three times a day (TID) | RESPIRATORY_TRACT | Status: DC
  Administered 2019-05-16 – 2019-05-19 (×11): 2 via RESPIRATORY_TRACT
  Filled 2019-05-16: qty 12.9

## 2019-05-16 MED ORDER — ASCORBIC ACID 500 MG PO TABS
500.0000 mg | ORAL_TABLET | Freq: Every day | ORAL | Status: DC
  Administered 2019-05-16 – 2019-05-29 (×13): 500 mg via ORAL
  Filled 2019-05-16 (×13): qty 1

## 2019-05-16 MED ORDER — ALBUTEROL SULFATE HFA 108 (90 BASE) MCG/ACT IN AERS
2.0000 | INHALATION_SPRAY | Freq: Three times a day (TID) | RESPIRATORY_TRACT | Status: DC
  Administered 2019-05-16 – 2019-05-18 (×9): 2 via RESPIRATORY_TRACT
  Filled 2019-05-16: qty 6.7

## 2019-05-16 NOTE — Progress Notes (Signed)
PROGRESS NOTE  [name] [id number] DOB: 1945/10/08 DOA: 05/15/2019 PCP: [name], MD  HPI/Recap of past 24 hours: [name] is an 73 y.o. male with past medical history significant for CLL, CAD, BPH and HFrEF 40 to 45% (2017) who was apparently not feeling well for the past 4 to 5 days.  Poor historian.  Denies chest pain or palpitations.  Of note patient was seen last week for dysuria and UA was negative at that time.  He was treated with Pyridium and Flomax with instructions to follow-up with urology.  ED Course:  Noted to meet criteria for sepsis with fever, tachypnea, tachycardia and leukocytosis.  Bilateral lower lobe pneumonias and significant hypoxia with 75% O2 sats on room air.  COVID-19 positive on 05/15/2019. Inflammatory markers are quite elevated with CRP of 18.5 although procalcitonin is also elevated at 1.12.  Admitted for acute COVID-19 viral pneumonia superimposed by community-acquired pneumonia.  Started on remdesivir, Decadron and IV antibiotics empirically.  05/16/19: Seen and examined.  Poor historian.  Admits to diarrhea earlier with diffused abdominal pain.  Denies nausea and vomiting.  No chest pain.  Currently on 2.5 L nasal cannula with O2 saturation 93%.   Assessment/Plan: Principal Problem:   Sepsis ([place]) Active Problems:   CLL (chronic lymphocytic leukemia) (HCC)   BPH (benign prostatic hyperplasia)   CAD S/P PCI-- RCA DES 2014,  CFX DES Jan 2017   PTSD (post-traumatic stress disorder)   Benign essential HTN   Ischemic cardiomyopathy   GERD (gastroesophageal reflux disease)   Esophageal dysphagia   Community acquired pneumonia   Pneumonia due to COVID-19 virus   COVID-19  Acute COVID-19 viral pneumonia superimposed by community-acquired pneumonia Presented with significant hypoxia  COVID-19 positive on 05/15/2019.   Procalcitonin greater than 1.1 Independently reviewed chest x-ray done on admission which showed bilateral patchy  infiltrates Inflammatory markers significantly elevated CRP greater than 19 and D-dimer greater than 4 Started on remdesivir and Decadron Started on IV antibiotics empirically Rocephin and IV azithromycin day number 2 out of 5 Day number 2 out of 5 of remdesivir Day number 2 out of 10 of Decadron IV 6 mg daily Continue to trend inflammatory markers Currently requiring 2.5 L to maintain O2 saturation greater than 90% Maintain O2 saturation greater than 92% Continue inhalers 3 times daily and incentive spirometer use Continue vitamin C, D3 and zinc  Acute hypoxic respiratory failure secondary to acute COVID-19 viral pneumonia Not on oxygen supplementation at baseline Management as stated above  AKI on CKD 3 improving, likely prerenal in the setting of diarrhea and poor oral intake Baseline creatinine appears to be 1.2 with GFR 55 Presented with creatinine of 1.7 with GFR 39 Creatinine is trending down Continue to avoid nephrotoxins Continue daily renal panel  Diarrhea likely related to acute COVID-19 infection Treat symptomatically  Resolved hypokalemia Magnesium 2.0 and potassium 3.8  Resolved hypotension Blood pressures currently at goal DC IV fluid Avoid IV fluid due to COVID-19 viral pneumonia Continue to hold off home antihypertensives Maintain MAP greater than 65  Chronic systolic CHF Last 2D echo done on 2017 showed LVEF 40 to 45% Start strict I's and O's and daily weight Hold off Lasix and lisinopril for now due to soft blood pressures  LUTS Flomax on hold to avoid hypotension Resume Pyridium if becomes symptomatic  GERD Continue Protonix  Coronary artery disease Continue aspirin, Plavix and Zocor Denies any anginal symptoms  BPH Resume tamsulosin when appropriate Holding to  avoid hypotension  CLL  Immunocompromised Closely monitor Will need to follow-up with oncology at discharge  DVT prophylaxis: Lovenox subcu daily Code Status: Full  code. Family Communication:  None at bedside.   Disposition Plan:  Patient is currently not appropriate for discharge at this time due to ongoing treatment for acute COVID-19 viral pneumonia.  Patient requires at least 2 midnights for further evaluation and treatment of present condition.   Consults called: None   Objective: Vitals:   05/16/19 0430 05/16/19 0445 05/16/19 0548 05/16/19 1210  BP: (!) 104/59  116/63 128/71  Pulse: 62  69 72  Resp: (!) 22  18   Temp:  97.8 F (36.6 C) 97.6 F (36.4 C) 98 F (36.7 C)  TempSrc:  Oral Oral Oral  SpO2: 93%  97%   Weight:   68.1 kg   Height:   5\' 7"  (1.702 m)     Intake/Output Summary (Last 24 hours) at 05/16/2019 1424 Last data filed at 05/16/2019 1240 Gross per 24 hour  Intake 735.23 ml  Output 200 ml  Net 535.23 ml   Filed Weights   05/15/19 1322 05/16/19 0548  Weight: 68.5 kg 68.1 kg    Exam:  . General: 73 y.o. year-old male well developed well nourished in no acute distress.  Alert and oriented x3. . Cardiovascular: Regular rate and rhythm with no rubs or gallops.  No thyromegaly or JVD noted.   Marland Kitchen Respiratory: Mild rales at bases with no wheezing noted.  Poor inspiratory effort.   . Abdomen: Soft diffuse tenderness on palpation.  Nondistended with normal bowel sounds x4 quadrants. . Musculoskeletal: No lower extremity edema. 2/4 pulses in all 4 extremities. Marland Kitchen Psychiatry: Mood is appropriate for condition and setting   Data Reviewed: CBC: Recent Labs  Lab 05/15/19 1359 05/15/19 2332  WBC 39.2* 36.1*  NEUTROABS 6.2 4.9  HGB 12.0* 12.3*  HCT 38.6* 40.0  MCV 95.8 98.0  PLT 139* [id number]*   Basic Metabolic Panel: Recent Labs  Lab 05/15/19 1359 05/15/19 2332  NA 133* 136  K 3.4* 3.8  CL 100 104  CO2 20* 20*  GLUCOSE 130* 176*  BUN 28* 29*  CREATININE 1.70* 1.53*  CALCIUM 7.8* 7.7*  MG  --  2.0  PHOS  --  3.4   GFR: Estimated Creatinine Clearance: 39 mL/min (A) (by C-G formula based on SCr of 1.53 mg/dL  (H)). Liver Function Tests: Recent Labs  Lab 05/15/19 1359 05/15/19 2332  AST 36 34  ALT 13 14  ALKPHOS 51 52  BILITOT 1.1 0.7  PROT 6.0* 6.0*  ALBUMIN 2.7* 2.6*   No results for input(s): LIPASE, AMYLASE in the last 168 hours. No results for input(s): AMMONIA in the last 168 hours. Coagulation Profile: Recent Labs  Lab 05/15/19 1359  INR 1.2   Cardiac Enzymes: No results for input(s): CKTOTAL, CKMB, CKMBINDEX, TROPONINI in the last 168 hours. BNP (last 3 results) No results for input(s): PROBNP in the last 8760 hours. HbA1C: No results for input(s): HGBA1C in the last 72 hours. CBG: No results for input(s): GLUCAP in the last 168 hours. Lipid Profile: Recent Labs    05/15/19 1359  TRIG 52   Thyroid Function Tests: No results for input(s): TSH, T4TOTAL, FREET4, T3FREE, THYROIDAB in the last 72 hours. Anemia Panel: Recent Labs    05/15/19 1359 05/15/19 2332  FERRITIN 843* 818*   Urine analysis:    Component Value Date/Time   COLORURINE AMBER (A) 05/15/2019 1352   APPEARANCEUR  HAZY (A) 05/15/2019 1352   LABSPEC 1.019 05/15/2019 1352   PHURINE 5.0 05/15/2019 1352   GLUCOSEU NEGATIVE 05/15/2019 1352   HGBUR LARGE (A) 05/15/2019 1352   BILIRUBINUR NEGATIVE 05/15/2019 1352   BILIRUBINUR negative 05/10/2019 1428   KETONESUR NEGATIVE 05/15/2019 1352   PROTEINUR 100 (A) 05/15/2019 1352   UROBILINOGEN 0.2 05/10/2019 1428   NITRITE POSITIVE (A) 05/15/2019 1352   LEUKOCYTESUR NEGATIVE 05/15/2019 1352   Sepsis Labs: @LABRCNTIP (procalcitonin:4,lacticidven:4)  ) Recent Results (from the past 240 hour(s))  Urine Culture     Status: None   Collection Time: 05/10/19  2:43 PM   Specimen: Urine, Random  Result Value Ref Range Status   Specimen Description   Final    URINE, RANDOM Performed at [facility], [place] [address] [place], [place] [address]    Special Requests   Final    NONE Performed at [facility], [address], [place], [place] [address]     Culture   Final    NO GROWTH Performed at [facility], [place] [address] [place], [place] [address]    Report Status 05/12/2019 FINAL  Final  Urine culture     Status: None   Collection Time: 05/15/19  1:52 PM   Specimen: In/Out Cath Urine  Result Value Ref Range Status   Specimen Description   Final    IN/OUT CATH URINE Performed at [facility], [address], [place], [place] [address]    Special Requests   Final    NONE Performed at [facility], [address] [place], [place] [address]    Culture   Final    NO GROWTH Performed at [facility], [place] [address], [place], [place] [address]    Report Status 05/16/2019 FINAL  Final  Blood Culture (routine x 2)     Status: None (Preliminary result)   Collection Time: 05/15/19  1:57 PM   Specimen: BLOOD RIGHT HAND  Result Value Ref Range Status   Specimen Description BLOOD RIGHT HAND  Final   Special Requests   Final    BOTTLES DRAWN AEROBIC ONLY Blood Culture adequate volume   Culture   Final    NO GROWTH < 24 HOURS Performed at [facility], [address], [place], [place] [address]    Report Status PENDING  Incomplete  Blood Culture (routine x 2)     Status: None (Preliminary result)   Collection Time: 05/15/19  2:03 PM   Specimen: Right Antecubital; Blood  Result Value Ref Range Status   Specimen Description RIGHT ANTECUBITAL  Final   Special Requests   Final    BOTTLES DRAWN AEROBIC AND ANAEROBIC Blood Culture adequate volume   Culture   Final    NO GROWTH < 24 HOURS Performed at [facility], [address] [place], [place] [address]    Report Status PENDING  Incomplete      Studies: CT Head Wo Contrast  Result Date: 05/15/2019 CLINICAL DATA:  Trauma EXAM: CT HEAD WITHOUT CONTRAST CT CERVICAL SPINE WITHOUT CONTRAST TECHNIQUE: Multidetector CT imaging of the head and cervical spine was performed following the standard protocol without intravenous contrast. Multiplanar CT image reconstructions of  the cervical spine were also generated. COMPARISON:  None. FINDINGS: CT HEAD FINDINGS Brain: There is no acute intracranial hemorrhage, mass effect or edema. Gray-white differentiation is preserved. Ventricles and sulci are within limits in size and configuration. Patchy and confluent hypoattenuation in the supratentorial white matter is nonspecific but probably  reflects moderate chronic microvascular ischemic changes. There is no extra-axial fluid collection. Vascular: There is intracranial atherosclerotic calcification at the skull base. Skull: Unremarkable. Sinuses/Orbits: Moderate ethmoid mucosal thickening. Orbits are unremarkable. Other: Mild patchy mastoid opacification CT CERVICAL SPINE FINDINGS Alignment: Anteroposterior alignment is maintained. Skull base and vertebrae: No acute fracture. Vertebral body heights are preserved. Soft tissues and spinal canal: No prevertebral fluid or swelling. No visible canal hematoma. Disc levels: Mild degenerative changes are present without high-grade osseous encroachment on the spinal canal. Upper chest: Partially imaged right upper lobe opacity. Other: None IMPRESSION: No evidence of acute intracranial injury or acute cervical spine fracture. Moderate chronic microvascular ischemic changes. Partially imaged right upper lobe opacity. Electronically Signed   By: Macy Mis M.D.   On: 05/15/2019 16:45   CT Cervical Spine Wo Contrast  Result Date: 05/15/2019 CLINICAL DATA:  Trauma EXAM: CT HEAD WITHOUT CONTRAST CT CERVICAL SPINE WITHOUT CONTRAST TECHNIQUE: Multidetector CT imaging of the head and cervical spine was performed following the standard protocol without intravenous contrast. Multiplanar CT image reconstructions of the cervical spine were also generated. COMPARISON:  None. FINDINGS: CT HEAD FINDINGS Brain: There is no acute intracranial hemorrhage, mass effect or edema. Gray-white differentiation is preserved. Ventricles and sulci are within limits in size  and configuration. Patchy and confluent hypoattenuation in the supratentorial white matter is nonspecific but probably reflects moderate chronic microvascular ischemic changes. There is no extra-axial fluid collection. Vascular: There is intracranial atherosclerotic calcification at the skull base. Skull: Unremarkable. Sinuses/Orbits: Moderate ethmoid mucosal thickening. Orbits are unremarkable. Other: Mild patchy mastoid opacification CT CERVICAL SPINE FINDINGS Alignment: Anteroposterior alignment is maintained. Skull base and vertebrae: No acute fracture. Vertebral body heights are preserved. Soft tissues and spinal canal: No prevertebral fluid or swelling. No visible canal hematoma. Disc levels: Mild degenerative changes are present without high-grade osseous encroachment on the spinal canal. Upper chest: Partially imaged right upper lobe opacity. Other: None IMPRESSION: No evidence of acute intracranial injury or acute cervical spine fracture. Moderate chronic microvascular ischemic changes. Partially imaged right upper lobe opacity. Electronically Signed   By: Macy Mis M.D.   On: 05/15/2019 16:45    Scheduled Meds: . albuterol  2 puff Inhalation Q8H  . aspirin EC  81 mg Oral Daily  . cholecalciferol  1,000 Units Oral Daily  . clopidogrel  75 mg Oral Daily  . dexamethasone  6 mg Oral Q24H  . enoxaparin (LOVENOX) injection  40 mg Subcutaneous QHS  . ipratropium  2 puff Inhalation Q8H  . pantoprazole  40 mg Oral BID AC  . simvastatin  40 mg Oral Daily  . vitamin C  500 mg Oral Daily  . zinc sulfate  220 mg Oral Daily    Continuous Infusions: . azithromycin Stopped (05/15/19 1619)  . cefTRIAXone (ROCEPHIN)  IV 1 g (05/16/19 1237)  . remdesivir 100 mg in NS 100 mL 100 mg (05/16/19 1240)     LOS: 1 day     Kayleen Memos, MD [facility] Pager [phone]  If 7PM-7AM, please contact night-coverage [email or web address] Password [email or web address] 05/16/2019, 2:24 PM

## 2019-05-17 LAB — C-REACTIVE PROTEIN: CRP: 11.1 mg/dL — ABNORMAL HIGH (ref <1.0)

## 2019-05-17 LAB — PATHOLOGIST SMEAR REVIEW

## 2019-05-17 LAB — CBC WITH DIFFERENTIAL/PLATELET
Abs Immature Granulocytes: 0.14 10*3/uL — ABNORMAL HIGH (ref 0.00–0.07)
Basophils Absolute: 0 10*3/uL (ref 0.0–0.1)
Basophils Relative: 0 %
Eosinophils Absolute: 0 10*3/uL (ref 0.0–0.5)
Eosinophils Relative: 0 %
HCT: 38.5 % — ABNORMAL LOW (ref 39.0–52.0)
Hemoglobin: 12.1 g/dL — ABNORMAL LOW (ref 13.0–17.0)
Immature Granulocytes: 0 %
Lymphocytes Relative: 81 %
Lymphs Abs: 36.8 10*3/uL — ABNORMAL HIGH (ref 0.7–4.0)
MCH: 29.9 pg (ref 26.0–34.0)
MCHC: 31.4 g/dL (ref 30.0–36.0)
MCV: 95.1 fL (ref 80.0–100.0)
Monocytes Absolute: 0.3 10*3/uL (ref 0.1–1.0)
Monocytes Relative: 1 %
Neutro Abs: 8.4 10*3/uL — ABNORMAL HIGH (ref 1.7–7.7)
Neutrophils Relative %: 18 %
Platelets: 170 10*3/uL (ref 150–400)
RBC: 4.05 MIL/uL — ABNORMAL LOW (ref 4.22–5.81)
RDW: 14.3 % (ref 11.5–15.5)
WBC: 45.7 10*3/uL — ABNORMAL HIGH (ref 4.0–10.5)
nRBC: 0 % (ref 0.0–0.2)

## 2019-05-17 LAB — COMPREHENSIVE METABOLIC PANEL
ALT: 16 U/L (ref 0–44)
AST: 37 U/L (ref 15–41)
Albumin: 2.3 g/dL — ABNORMAL LOW (ref 3.5–5.0)
Alkaline Phosphatase: 46 U/L (ref 38–126)
Anion gap: 9 (ref 5–15)
BUN: 34 mg/dL — ABNORMAL HIGH (ref 8–23)
CO2: 19 mmol/L — ABNORMAL LOW (ref 22–32)
Calcium: 8.1 mg/dL — ABNORMAL LOW (ref 8.9–10.3)
Chloride: 109 mmol/L (ref 98–111)
Creatinine, Ser: 1.41 mg/dL — ABNORMAL HIGH (ref 0.61–1.24)
GFR calc Af Amer: 56 mL/min — ABNORMAL LOW (ref >60)
GFR calc non Af Amer: 48 mL/min — ABNORMAL LOW (ref >60)
Glucose, Bld: 176 mg/dL — ABNORMAL HIGH (ref 70–99)
Potassium: 4.4 mmol/L (ref 3.5–5.1)
Sodium: 137 mmol/L (ref 135–145)
Total Bilirubin: 0.8 mg/dL (ref 0.3–1.2)
Total Protein: 5.3 g/dL — ABNORMAL LOW (ref 6.5–8.1)

## 2019-05-17 LAB — FERRITIN: Ferritin: 979 ng/mL — ABNORMAL HIGH (ref 24–336)

## 2019-05-17 LAB — D-DIMER, QUANTITATIVE: D-Dimer, Quant: 2.82 ug/mL-FEU — ABNORMAL HIGH (ref 0.00–0.50)

## 2019-05-17 LAB — MAGNESIUM: Magnesium: 2 mg/dL (ref 1.7–2.4)

## 2019-05-17 LAB — PHOSPHORUS: Phosphorus: 3.4 mg/dL (ref 2.5–4.6)

## 2019-05-17 LAB — LACTATE DEHYDROGENASE: LDH: 199 U/L — ABNORMAL HIGH (ref 98–192)

## 2019-05-17 NOTE — Progress Notes (Addendum)
PROGRESS NOTE  [name] [id number] DOB: [birth year] DOA: 05/15/2019 PCP: [name], MD  HPI/Recap of past 24 hours: [name] is an 73 y.o. male with past medical history significant for CLL, CAD, BPH and HFrEF 40 to 45% (2017) who was apparently not feeling well for the past 4 to 5 days.  Poor historian.  Denies chest pain or palpitations.  Of note patient was seen last week for dysuria and UA was negative at that time.  He was treated with Pyridium and Flomax with instructions to follow-up with urology.  ED Course:  Noted to meet criteria for sepsis with fever, tachypnea, tachycardia and leukocytosis.  Bilateral lower lobe pneumonias and significant hypoxia with 75% O2 sats on room air.  COVID-19 positive on 05/15/2019. Inflammatory markers are quite elevated with CRP of 18.5 although procalcitonin is also elevated at 1.12.  Admitted for acute COVID-19 viral pneumonia superimposed by community-acquired pneumonia.  Started on remdesivir, Decadron and IV antibiotics empirically.  05/17/19: Seen and examined.  Inflammatory markers are trending down.  He has no new complaints.  Will obtain home O2 evaluation for DC planning.  PT OT to assess with fall precautions.    Assessment/Plan: Principal Problem:   Sepsis ([place]) Active Problems:   CLL (chronic lymphocytic leukemia) (HCC)   BPH (benign prostatic hyperplasia)   CAD S/P PCI-- RCA DES 2014,  CFX DES Jan 2017   PTSD (post-traumatic stress disorder)   Benign essential HTN   Ischemic cardiomyopathy   GERD (gastroesophageal reflux disease)   Esophageal dysphagia   Community acquired pneumonia   Pneumonia due to COVID-19 virus   COVID-19  Sepsis 2/2 Acute COVID-19 viral pneumonia superimposed by community-acquired pneumonia Presented with significant hypoxia, fever and leukocytosis  COVID-19 positive on 05/15/2019.   Procalcitonin greater than 1.1 Independently reviewed chest x-ray done on admission which showed  bilateral patchy infiltrates Inflammatory markers significantly elevated CRP greater than 19 and D-dimer greater than 4 Continue remdesivir and Decadron Continue IV antibiotics empirically Rocephin and IV azithromycin day number 3 out of 5 Day number 3 out of 5 of remdesivir Day number 3 out of 10 of Decadron IV 6 mg daily Continue to trend inflammatory markers, trending down. Maintain O2 saturation greater than 92% Continue bronchodilators Continue inhalers 3 times daily and incentive spirometer use Continue vitamin C, D3 and zinc Continue incentive spirometer  Acute hypoxic respiratory failure secondary to acute COVID-19 viral pneumonia Not on oxygen supplementation at baseline Management as stated above  AKI on CKD 3A improving, likely prerenal in the setting of diarrhea and poor oral intake Baseline creatinine appears to be 1.2 with GFR 55 Presented with creatinine of 1.7 with GFR 39 Creatinine continues to trend down Continue to avoid nephrotoxins Continue daily renal panel  Diarrhea likely related to acute COVID-19 infection Treat symptomatically  Resolved hypokalemia Magnesium 2.0 and potassium 3.8  Resolved hypotension Blood pressures currently at goal DC IV fluid Avoid IV fluid due to COVID-19 viral pneumonia Continue to hold off home antihypertensives Maintain MAP greater than 65  Chronic systolic CHF Last 2D echo done on 2017 showed LVEF 40 to 45% Start strict I's and O's and daily weight Hold off Lasix and lisinopril for now due to soft blood pressures  LUTS Flomax on hold to avoid hypotension Resume Pyridium if becomes symptomatic  GERD Continue Protonix  Coronary artery disease Continue aspirin, Plavix and Zocor Denies any anginal symptoms  BPH Resume tamsulosin when appropriate Holding to avoid hypotension  CLL  Immunocompromised Closely monitor Will need to follow-up with oncology at discharge  DVT prophylaxis: Lovenox subcu daily Code  Status: Full code. Family Communication:  None at bedside.   Disposition Plan:  Patient is currently not appropriate for discharge at this time due to ongoing treatment for acute COVID-19 viral pneumonia.  Patient requires at least 2 midnights for further evaluation and treatment of present condition.   Consults called: None   Objective: Vitals:   05/16/19 2253 05/17/19 0000 05/17/19 0400 05/17/19 0800  BP:  107/65 119/64 (!) 117/57  Pulse: (!) 57 (!) 57 60 62  Resp: 20 (!) 23 (!) 21   Temp:  98.5 F (36.9 C) (!) 97.3 F (36.3 C)   TempSrc:  Oral Axillary   SpO2: 91% 92% 93% 90%  Weight:   67.6 kg   Height:        Intake/Output Summary (Last 24 hours) at 05/17/2019 1527 Last data filed at 05/17/2019 0900 Gross per 24 hour  Intake 200 ml  Output 575 ml  Net -375 ml   Filed Weights   05/15/19 1322 05/16/19 0548 05/17/19 0400  Weight: 68.5 kg 68.1 kg 67.6 kg    Exam:  . General: 73 y.o. year-old male 1 drop no nausea no acute distress.  Alert and interactive.  Hard of hearing.   . Cardiovascular: Regular rate and rhythm no rubs or gallops.   Marland Kitchen Respiratory: No rales at bases.  No wheezing noted.   . Abdomen: Soft mild diffuse tenderness on palpation.  Bowel sounds present. . Musculoskeletal: No lower extremity edema bilaterally. Marland Kitchen Psychiatry: Mood is appropriate for condition and setting.   Data Reviewed: CBC: Recent Labs  Lab 05/15/19 1359 05/15/19 2332 05/17/19 0429  WBC 39.2* 36.1* 45.7*  NEUTROABS 6.2 4.9 8.4*  HGB 12.0* 12.3* 12.1*  HCT 38.6* 40.0 38.5*  MCV 95.8 98.0 95.1  PLT 139* 141* [id number]   Basic Metabolic Panel: Recent Labs  Lab 05/15/19 1359 05/15/19 2332 05/17/19 0429  NA 133* 136 137  K 3.4* 3.8 4.4  CL 100 104 109  CO2 20* 20* 19*  GLUCOSE 130* 176* 176*  BUN 28* 29* 34*  CREATININE 1.70* 1.53* 1.41*  CALCIUM 7.8* 7.7* 8.1*  MG  --  2.0 2.0  PHOS  --  3.4 3.4   GFR: Estimated Creatinine Clearance: 42.3 mL/min (A) (by C-G formula  based on SCr of 1.41 mg/dL (H)). Liver Function Tests: Recent Labs  Lab 05/15/19 1359 05/15/19 2332 05/17/19 0429  AST 36 34 37  ALT 13 14 16   ALKPHOS 51 52 46  BILITOT 1.1 0.7 0.8  PROT 6.0* 6.0* 5.3*  ALBUMIN 2.7* 2.6* 2.3*   No results for input(s): LIPASE, AMYLASE in the last 168 hours. No results for input(s): AMMONIA in the last 168 hours. Coagulation Profile: Recent Labs  Lab 05/15/19 1359  INR 1.2   Cardiac Enzymes: No results for input(s): CKTOTAL, CKMB, CKMBINDEX, TROPONINI in the last 168 hours. BNP (last 3 results) No results for input(s): PROBNP in the last 8760 hours. HbA1C: No results for input(s): HGBA1C in the last 72 hours. CBG: No results for input(s): GLUCAP in the last 168 hours. Lipid Profile: Recent Labs    05/15/19 1359  TRIG 52   Thyroid Function Tests: No results for input(s): TSH, T4TOTAL, FREET4, T3FREE, THYROIDAB in the last 72 hours. Anemia Panel: Recent Labs    05/15/19 2332 05/17/19 0429  FERRITIN 818* 979*   Urine analysis:    Component  Value Date/Time   COLORURINE AMBER (A) 05/15/2019 1352   APPEARANCEUR HAZY (A) 05/15/2019 1352   LABSPEC 1.019 05/15/2019 1352   PHURINE 5.0 05/15/2019 1352   GLUCOSEU NEGATIVE 05/15/2019 1352   HGBUR LARGE (A) 05/15/2019 1352   BILIRUBINUR NEGATIVE 05/15/2019 1352   BILIRUBINUR negative 05/10/2019 1428   KETONESUR NEGATIVE 05/15/2019 1352   PROTEINUR 100 (A) 05/15/2019 1352   UROBILINOGEN 0.2 05/10/2019 1428   NITRITE POSITIVE (A) 05/15/2019 1352   LEUKOCYTESUR NEGATIVE 05/15/2019 1352   Sepsis Labs: @LABRCNTIP (procalcitonin:4,lacticidven:4)  ) Recent Results (from the past 240 hour(s))  Urine Culture     Status: None   Collection Time: 05/10/19  2:43 PM   Specimen: Urine, Random  Result Value Ref Range Status   Specimen Description   Final    URINE, RANDOM Performed at [facility], [place] [address] [place], [place] [address]    Special Requests   Final     NONE Performed at [facility], [address] [place], [place] [address]    Culture   Final    NO GROWTH Performed at [facility], [place] [address] [place], [place] [address]    Report Status 05/12/2019 FINAL  Final  Urine culture     Status: None   Collection Time: 05/15/19  1:52 PM   Specimen: In/Out Cath Urine  Result Value Ref Range Status   Specimen Description   Final    IN/OUT CATH URINE Performed at [facility], [address] [place], [place] [address]    Special Requests   Final    NONE Performed at [facility], [address] [place], [place] [address]    Culture   Final    NO GROWTH Performed at [facility], [place] [address] [place], [place]  [address]    Report Status 05/16/2019 FINAL  Final  Blood Culture (routine x 2)     Status: None (Preliminary result)   Collection Time: 05/15/19  1:57 PM   Specimen: BLOOD RIGHT HAND  Result Value Ref Range Status   Specimen Description BLOOD RIGHT HAND  Final   Special Requests   Final    BOTTLES DRAWN AEROBIC ONLY Blood Culture adequate volume   Culture   Final    NO GROWTH 2 DAYS Performed at [facility], [address] [place], [place] [address]    Report Status PENDING  Incomplete  Blood Culture (routine x 2)     Status: None (Preliminary result)   Collection Time: 05/15/19  2:03 PM   Specimen: Right Antecubital; Blood  Result Value Ref Range Status   Specimen Description RIGHT ANTECUBITAL  Final   Special Requests   Final    BOTTLES DRAWN AEROBIC AND ANAEROBIC Blood Culture adequate volume   Culture   Final    NO GROWTH 2 DAYS Performed at [facility], [address], [place], [place] [address]    Report Status PENDING  Incomplete      Studies: No results found.  Scheduled Meds: . albuterol  2 puff Inhalation Q8H  . aspirin EC  81 mg Oral Daily  . cholecalciferol  1,000 Units Oral Daily  . clopidogrel  75 mg Oral Daily  . dexamethasone  6 mg Oral Q24H  . enoxaparin (LOVENOX) injection   40 mg Subcutaneous QHS  . ipratropium  2 puff Inhalation Q8H  . pantoprazole  40 mg Oral BID AC  . simvastatin  40 mg Oral Daily  . vitamin C  500  mg Oral Daily  . zinc sulfate  220 mg Oral Daily    Continuous Infusions: . azithromycin 500 mg (05/17/19 1426)  . cefTRIAXone (ROCEPHIN)  IV 1 g (05/17/19 1351)  . remdesivir 100 mg in NS 100 mL 100 mg (05/17/19 0845)     LOS: 2 days     Kayleen Memos, MD [facility] Pager [phone]  If 7PM-7AM, please contact night-coverage [email or web address] Password [facility] 05/17/2019, 3:27 PM

## 2019-05-17 NOTE — TOC Initial Note (Addendum)
Transition of Care [facility]) - Initial/Assessment Note    Patient Details  Name: [name] MRN: [id number] Date of Birth: 1946/05/09  Transition of [facility]) CM/SW Contact:    Maryclare Labrador, RN Phone Number: 05/17/2019, 12:21 PM  Clinical Narrative:   CM spoke with pt via phone.  PTA Independent from home with wife - pt informed CM that he still drives.  Pt also informed CM that if he needs help with anything he is family that can assist if needed.  Pt informed CM that he has  PCP and denied barriers with paying for medications.  TOC will continue to follow                 Expected Discharge Plan: Home/Self Care     Patient Goals and CMS Choice        Expected Discharge Plan and Services Expected Discharge Plan: Home/Self Care       Living arrangements for the past 2 months: Single Family Home                                      Prior Living Arrangements/Services Living arrangements for the past 2 months: Single Family Home Lives with:: Spouse Patient language and need for interpreter reviewed:: No        Need for Family Participation in Patient Care: Yes (Comment) Care giver support system in place?: Yes (comment)(wife)   Criminal Activity/Legal Involvement Pertinent to Current Situation/Hospitalization: No - Comment as needed  Activities of Daily Living      Permission Sought/Granted                  Emotional Assessment   Attitude/Demeanor/Rapport: Gracious, Charismatic Affect (typically observed): Accepting Orientation: : Oriented to Self, Oriented to Place, Oriented to Situation      Admission diagnosis:  Disorientation [R41.0] Community acquired pneumonia, unspecified laterality [J18.9] Sepsis, due to unspecified organism, unspecified whether acute organ dysfunction present ([place]) [A41.9] COVID-19 virus detected [U07.1] COVID-19 [U07.1] Patient Active Problem List   Diagnosis Date Noted  . Sepsis ([place]) 05/15/2019  .  Community acquired pneumonia 05/15/2019  . Pneumonia due to COVID-19 virus 05/15/2019  . COVID-19 05/15/2019  . Abdominal pain 03/06/2018  . Constipation 03/06/2018  . GERD (gastroesophageal reflux disease) 03/08/2016  . Esophageal dysphagia 03/08/2016  . Chronic cough 03/08/2016  . Ischemic cardiomyopathy   . STEMI - 06/16/15 06/16/2015  . Benign essential HTN 06/16/2015  . Myocardial infarction of inferolateral wall ([place]) 06/16/2015  . Cholelithiasis without cholecystitis 12/18/2013  . Hematochezia 06/04/2013  . Lower leg edema 03/14/2013  . PTSD (post-traumatic stress disorder)   . CAD S/P PCI-- RCA DES 2014,  CFX DES Jan 2017 11/24/2012  . NSTEMI (non-ST elevated myocardial infarction) - history of 11/23/2012  . Hyperglycemia 11/23/2012  . History of smoking 11/23/2012  . Dyslipidemia, goal LDL below 70 - on simvastatin; monitored by PCP 11/23/2012  . Diverticulitis of colon without hemorrhage- on antibiotics for recent flair up 09/19/2012  . Hx of adenomatous colonic polyps 09/19/2012  . CLL (chronic lymphocytic leukemia) ([place]) 04/08/2011  . BPH (benign prostatic hyperplasia) 04/08/2011   PCP:  [name], MD Pharmacy:   [id number] - [place], [address] [place] [address] Phone: [phone] Fax: [phone]     Social Determinants of Health (SDOH) Interventions    Readmission  Risk Interventions Readmission Risk Prevention Plan 05/17/2019  Transportation Screening Complete  HRI or Home Care Consult (No Data)  Some recent data might be hidden

## 2019-05-18 LAB — CBC WITH DIFFERENTIAL/PLATELET
Abs Immature Granulocytes: 0.17 10*3/uL — ABNORMAL HIGH (ref 0.00–0.07)
Basophils Absolute: 0 10*3/uL (ref 0.0–0.1)
Basophils Relative: 0 %
Eosinophils Absolute: 0 10*3/uL (ref 0.0–0.5)
Eosinophils Relative: 0 %
HCT: 39 % (ref 39.0–52.0)
Hemoglobin: 12.2 g/dL — ABNORMAL LOW (ref 13.0–17.0)
Immature Granulocytes: 0 %
Lymphocytes Relative: 83 %
Lymphs Abs: 37.7 10*3/uL — ABNORMAL HIGH (ref 0.7–4.0)
MCH: 29.4 pg (ref 26.0–34.0)
MCHC: 31.3 g/dL (ref 30.0–36.0)
MCV: 94 fL (ref 80.0–100.0)
Monocytes Absolute: 0.1 10*3/uL (ref 0.1–1.0)
Monocytes Relative: 0 %
Neutro Abs: 8 10*3/uL — ABNORMAL HIGH (ref 1.7–7.7)
Neutrophils Relative %: 17 %
Platelets: 186 10*3/uL (ref 150–400)
RBC: 4.15 MIL/uL — ABNORMAL LOW (ref 4.22–5.81)
RDW: 14.4 % (ref 11.5–15.5)
WBC: 45.2 10*3/uL — ABNORMAL HIGH (ref 4.0–10.5)
nRBC: 0 % (ref 0.0–0.2)

## 2019-05-18 LAB — FERRITIN: Ferritin: 874 ng/mL — ABNORMAL HIGH (ref 24–336)

## 2019-05-18 LAB — D-DIMER, QUANTITATIVE: D-Dimer, Quant: 2.05 ug/mL-FEU — ABNORMAL HIGH (ref 0.00–0.50)

## 2019-05-18 LAB — COMPREHENSIVE METABOLIC PANEL
ALT: 17 U/L (ref 0–44)
AST: 37 U/L (ref 15–41)
Albumin: 2.3 g/dL — ABNORMAL LOW (ref 3.5–5.0)
Alkaline Phosphatase: 47 U/L (ref 38–126)
Anion gap: 9 (ref 5–15)
BUN: 32 mg/dL — ABNORMAL HIGH (ref 8–23)
CO2: 20 mmol/L — ABNORMAL LOW (ref 22–32)
Calcium: 8.1 mg/dL — ABNORMAL LOW (ref 8.9–10.3)
Chloride: 110 mmol/L (ref 98–111)
Creatinine, Ser: 1.23 mg/dL (ref 0.61–1.24)
GFR calc Af Amer: >60 mL/min (ref >60)
GFR calc non Af Amer: 57 mL/min — ABNORMAL LOW (ref >60)
Glucose, Bld: 170 mg/dL — ABNORMAL HIGH (ref 70–99)
Potassium: 4.6 mmol/L (ref 3.5–5.1)
Sodium: 139 mmol/L (ref 135–145)
Total Bilirubin: 0.8 mg/dL (ref 0.3–1.2)
Total Protein: 5.2 g/dL — ABNORMAL LOW (ref 6.5–8.1)

## 2019-05-18 LAB — LACTATE DEHYDROGENASE: LDH: 224 U/L — ABNORMAL HIGH (ref 98–192)

## 2019-05-18 LAB — C-REACTIVE PROTEIN: CRP: 7.6 mg/dL — ABNORMAL HIGH (ref <1.0)

## 2019-05-18 LAB — PHOSPHORUS: Phosphorus: 3.4 mg/dL (ref 2.5–4.6)

## 2019-05-18 LAB — MAGNESIUM: Magnesium: 2 mg/dL (ref 1.7–2.4)

## 2019-05-18 NOTE — Progress Notes (Signed)
Updated the patient's spouse via phone. All questions answered to her satisfaction.   She would prefer that her husband goes to rehab in [place], closer to home. States she does not drive.

## 2019-05-18 NOTE — Evaluation (Signed)
Physical Therapy Evaluation Patient Details Name: [name] MRN: [id number] DOB: Apr 29, 1946 Today's Date: 05/18/2019   History of Present Illness  73 y.o. male with past medical history significant for CLL, CAD, BPH and HFrEF who was apparently not feeling very well for the past 4 to 5 days.   Patient was noted to have bilateral lower lobe pneumonias and significant hypoxia with 75% O2 sats on room air.  Patient was given nasal cannula 4 L oxygen with improvement to O2 sats in the 90s with decreased work of breathing.   Covid test subsequently came back positive. admitted for Covid versus community-acquired pneumonia.   Clinical Impression  Pt poor historian however reports PTA pt living with wife in 2 story home with steps to enter. Pt states he has been using RW for "a little while" after experiencing a fall with apparent L ankle injury. Pt indicates that sometimes his wife helps him with getting dressed. Pt is limited in his safe mobility by decreased cognition, especially safety awareness in presence of decreased strength and balance. Pt requires min A for bed mobility, and min Ax2 for transfers and ambulation of 10 feet with RW. PT recommending SNF level rehab at discharge to improve safe mobility prior to going home. PT will continue to follow acutely.     Follow Up Recommendations SNF    Equipment Recommendations  Other (comment)(TBD at next venue)       Precautions / Restrictions Precautions Precautions: Fall Precaution Comments: reports falling recently Restrictions Weight Bearing Restrictions: No      Mobility  Bed Mobility Overal bed mobility: Needs Assistance Bed Mobility: Supine to Sit     Supine to sit: Min assist     General bed mobility comments: cues needed for sequencing transfer, min A at trunk to pull up into sitting  Transfers Overall transfer level: Needs assistance Equipment used: Rolling walker (2 wheeled) Transfers: Sit to/from Stand Sit to  Stand: Min assist;+2 physical assistance;+2 safety/equipment         General transfer comment: min A +2 to stand and steady, pt with posterior LOB and needing close guard and consistent correction due to decrased awareness  Ambulation/Gait Ambulation/Gait assistance: Min assist;+2 physical assistance Gait Distance (Feet): 10 Feet Assistive device: Rolling walker (2 wheeled) Gait Pattern/deviations: Step-through pattern;Decreased step length - right;Decreased step length - left;Decreased weight shift to left;Shuffle;Trunk flexed Gait velocity: slowed Gait velocity interpretation: <1.8 ft/sec, indicate of risk for recurrent falls General Gait Details: minAx2 for steadying, vc for proximity to RW and upright posture, mild knee buckling at sink performing oral hygiene so opted to sit in recliner from there rather than walk back to it beside the bed        Balance Overall balance assessment: Needs assistance Sitting-balance support: Feet supported Sitting balance-Leahy Scale: Fair     Standing balance support: Bilateral upper extremity supported;During functional activity Standing balance-Leahy Scale: Poor Standing balance comment: reliant on external support and therapist assist                             Pertinent Vitals/Pain Pain Assessment: Faces Faces Pain Scale: Hurts a little bit Pain Location: L ankle  Pain Descriptors / Indicators: Aching;Sore Pain Intervention(s): Limited activity within patient's tolerance;Monitored during session;Repositioned    Home Living Family/patient expects to be discharged to:: Private residence Living Arrangements: Spouse/significant other Available Help at Discharge: Family;Other (Comment)(stated wife is sick) Type of Home: House Home Access:  Stairs to enter Entrance Stairs-Rails: Psychiatric nurse of Steps: 2 Home Layout: Two level;Able to live on main level with bedroom/bathroom Home Equipment: Gilford Rile - 2  wheels      Prior Function Level of Independence: Needs assistance   Gait / Transfers Assistance Needed: walks with RW due to foot injury  ADL's / Homemaking Assistance Needed: reports wife assists with dressing  Comments: pt remains confused, unsure of entire accuracy of home set up        Extremity/Trunk Assessment   Upper Extremity Assessment Upper Extremity Assessment: Defer to OT evaluation    Lower Extremity Assessment Lower Extremity Assessment: LLE deficits/detail;RLE deficits/detail RLE Deficits / Details: ROM WFL, strength grossly assessed 4/5 LLE Deficits / Details: ROM WFL, strength grossly 3+/5 LLE Coordination: decreased fine motor       Communication   Communication: Expressive difficulties;Other (comment)(difficulty with word finding)  Cognition Arousal/Alertness: Awake/alert Behavior During Therapy: WFL for tasks assessed/performed Overall Cognitive Status: Impaired/Different from baseline Area of Impairment: Orientation;Memory;Following commands;Safety/judgement;Problem solving                 Orientation Level: Disoriented to;Place;Time;Situation   Memory: Decreased short-term memory(cannot recall place few minutes after being reoriented) Following Commands: Follows one step commands with increased time Safety/Judgement: Decreased awareness of safety;Decreased awareness of deficits   Problem Solving: Slow processing;Decreased initiation;Difficulty sequencing;Requires verbal cues;Requires tactile cues General Comments: pt needing multimodal cueing to safely complete tasks. While brushing teeth, pt having difficulty with initiation and termination of activity. He remains largely disoriented of current situation, time, and place      General Comments General comments (skin integrity, edema, etc.): LE edema, L>R, Pt on 6L HFNC on entry with SaO2 in low 90s% with ambulation on 6L from protable tank SaO2 around 88%O2        Assessment/Plan    PT  Assessment Patient needs continued PT services  PT Problem List Decreased strength;Decreased activity tolerance;Decreased balance;Decreased cognition;Decreased mobility;Decreased coordination;Decreased safety awareness;Cardiopulmonary status limiting activity       PT Treatment Interventions DME instruction;Gait training;Functional mobility training;Therapeutic activities;Therapeutic exercise;Balance training;Cognitive remediation;Patient/family education    PT Goals (Current goals can be found in the Care Plan section)  Acute Rehab PT Goals Patient Stated Goal: to brush my teeth PT Goal Formulation: Patient unable to participate in goal setting Time For Goal Achievement: 06/01/19 Potential to Achieve Goals: Good    Frequency Min 2X/week        Co-evaluation PT/OT/SLP Co-Evaluation/Treatment: Yes Reason for Co-Treatment: For patient/therapist safety PT goals addressed during session: Mobility/safety with mobility OT goals addressed during session: ADL's and self-care;Proper use of Adaptive equipment and DME       AM-PAC PT "6 Clicks" Mobility  Outcome Measure Help needed turning from your back to your side while in a flat bed without using bedrails?: None Help needed moving from lying on your back to sitting on the side of a flat bed without using bedrails?: A Little Help needed moving to and from a bed to a chair (including a wheelchair)?: A Little Help needed standing up from a chair using your arms (e.g., wheelchair or bedside chair)?: A Lot Help needed to walk in hospital room?: A Lot Help needed climbing 3-5 steps with a railing? : Total 6 Click Score: 15    End of Session Equipment Utilized During Treatment: Gait belt Activity Tolerance: Patient tolerated treatment well Patient left: in chair;with call bell/phone within reach;with chair alarm set;Other (comment)(mitten on R hand replaced, L mitten left  off to use call bel) Nurse Communication: Mobility status PT Visit  Diagnosis: Unsteadiness on feet (R26.81);Other abnormalities of gait and mobility (R26.89);Muscle weakness (generalized) (M62.81);Difficulty in walking, not elsewhere classified (R26.2)    Time: 1105-1140 PT Time Calculation (min) (ACUTE ONLY): 35 min   Charges:   PT Evaluation $PT Eval Moderate Complexity: 1 Mod          Adyn Serna B. Migdalia Dk PT, DPT Acute Rehabilitation Services Pager [phone] Office [phone]   [place] 05/18/2019, 4:21 PM

## 2019-05-18 NOTE — Progress Notes (Addendum)
PROGRESS NOTE  [name] DOB: Jan 17, 1946 DOA: 05/15/2019 PCP: [name], MD  HPI/Recap of past 24 hours: [name] is an 73 y.o. male with past medical history significant for CLL, CAD, BPH and HFrEF 40 to 45% (2017) who was apparently not feeling well for the past 4 to 5 days.  Poor historian.  Denies chest pain or palpitations.  Of note patient was seen last week for dysuria and UA was negative at that time.  He was treated with Pyridium and Flomax with instructions to follow-up with urology.  ED Course:  Noted to meet criteria for sepsis with fever, tachypnea, tachycardia and leukocytosis.  Bilateral lower lobe pneumonias and significant hypoxia with 75% O2 sats on room air.  COVID-19 positive on 05/15/2019. Inflammatory markers are quite elevated with CRP of 18.5 although procalcitonin is also elevated at 1.12.  Admitted for acute COVID-19 viral pneumonia superimposed by community-acquired pneumonia.  Started on remdesivir, Decadron and IV antibiotics empirically.  05/18/19: Seen and examined.  Denies dyspnea at rest.  O2 saturation low 90s on 4 L nasal cannula.  Day #4/5 on remdesivir.  Also on IV antibiotics empirically due to elevated procalcitonin with concern for CAP.  PT assessed and recommended SNF.  CSW consulted to assist with placement.    Assessment/Plan: Principal Problem:   Sepsis ([place]) Active Problems:   CLL (chronic lymphocytic leukemia) ([facility])   BPH (benign prostatic hyperplasia)   CAD S/P PCI-- RCA DES 2014,  CFX DES Jan 2017   PTSD (post-traumatic stress disorder)   Benign essential HTN   Ischemic cardiomyopathy   GERD (gastroesophageal reflux disease)   Esophageal dysphagia   Community acquired pneumonia   Pneumonia due to COVID-19 virus   COVID-19  Sepsis 2/2 Acute COVID-19 viral pneumonia superimposed by community-acquired pneumonia Presented with significant hypoxia, fever and leukocytosis  COVID-19 positive on 05/15/2019.     Procalcitonin greater than 1.1 Repeat procalcitonin level in the morning.   Independently reviewed chest x-ray done on admission which showed bilateral patchy infiltrates Inflammatory markers significantly elevated on presentation, trending down Continue to trend inflammatory markers Continue remdesivir and Decadron Continue IV antibiotics empirically Rocephin and IV azithromycin day number 3 out of 5 Day number 4 out of 5 of remdesivir Day number 4 out of 10 of Decadron IV 6 mg daily Maintain O2 saturation greater than 92% Continue bronchodilators Continue inhalers 3 times daily and incentive spirometer use Continue vitamin C, D3 and zinc Continue incentive spirometer  Acute hypoxic respiratory failure secondary to acute COVID-19 viral pneumonia Not on oxygen supplementation at baseline Management as stated above  AKI on CKD 3A improving, likely prerenal in the setting of diarrhea and poor oral intake Baseline creatinine appears to be 1.2 with GFR 55 Presented with creatinine of 1.7 with GFR 39 Creatinine appears to be back to baseline 1.2 with GFR 57 Continue to avoid nephrotoxins Continue daily renal panel.  Diarrhea likely related to acute COVID-19 infection Treat symptomatically, improved.  Resolved hypokalemia Magnesium 2.0 and potassium 3.8  Resolved hypotension Blood pressure is at goal. Avoid IV fluid due to COVID-19 viral pneumonia Continue to hold off home antihypertensives Maintain MAP greater than 65  Chronic systolic CHF Last 2D echo done on 2017 showed LVEF 40 to 45% Continue strict I's and O's and daily weight Continue to hold off Lasix and lisinopril for now due to soft blood pressures  LUTS Flomax on hold to avoid hypotension Resume Pyridium if becomes symptomatic  GERD Continue Protonix  Coronary artery disease Continue aspirin, Plavix and Zocor Denies any anginal symptoms at the time of this visit.  BPH Resume tamsulosin when  appropriate Holding to avoid hypotension  CLL  Immunocompromised Closely monitor Will need to follow-up with oncology at discharge  Physical debility/ambulatory dysfunction PT assessment recommended SNF CSW consulted to assist with placement. Continue PT OT with assistance and fall precautions.  DVT prophylaxis: Lovenox subcu daily Code Status: Full code. Family Communication:  None at bedside.   Disposition Plan:  Possible discharge to SNF when bed is available and if the patient/family are agreeable.  Consults called: None   Objective: Vitals:   05/18/19 0019 05/18/19 0445 05/18/19 0800 05/18/19 1200  BP: (!) 106/51 125/72    Pulse: (!) 53 (!) 57    Resp: (!) 21 17    Temp: 98 F (36.7 C) 98.1 F (36.7 C) 98.3 F (36.8 C) (!) 97.5 F (36.4 C)  TempSrc: Oral Oral Oral Oral  SpO2: 94% 94%    Weight:  68.6 kg    Height:        Intake/Output Summary (Last 24 hours) at 05/18/2019 1514 Last data filed at 05/18/2019 1400 Gross per 24 hour  Intake 240 ml  Output 575 ml  Net -335 ml   Filed Weights   05/16/19 0548 05/17/19 0400 05/18/19 0445  Weight: 68.1 kg 67.6 kg 68.6 kg    Exam:  . General: 73 y.o. year-old male well-developed well-nourished no acute distress.  Alert and interactive.  Very hard of hearing.   . Cardiovascular: Regular rate and rhythm no rubs or gallops.   Marland Kitchen Respiratory: Mild rales at bases.  No wheezing noted.  Poor inspiratory effort. . Abdomen: Soft nondistended normal bowel sounds present. . Musculoskeletal: Trace lower extremity edema bilaterally.   Marland Kitchen   Psychiatry: Mood is appropriate for condition and setting.   Data Reviewed: CBC: Recent Labs  Lab 05/15/19 1359 05/15/19 2332 05/17/19 0429 05/18/19 0417  WBC 39.2* 36.1* 45.7* 45.2*  NEUTROABS 6.2 4.9 8.4* 8.0*  HGB 12.0* 12.3* 12.1* 12.2*  HCT 38.6* 40.0 38.5* 39.0  MCV 95.8 98.0 95.1 94.0  PLT 139* 141* 170 [id number]   Basic Metabolic Panel: Recent Labs  Lab 05/15/19 1359  05/15/19 2332 05/17/19 0429 05/18/19 0417  NA 133* 136 137 139  K 3.4* 3.8 4.4 4.6  CL 100 104 109 110  CO2 20* 20* 19* 20*  GLUCOSE 130* 176* 176* 170*  BUN 28* 29* 34* 32*  CREATININE 1.70* 1.53* 1.41* 1.23  CALCIUM 7.8* 7.7* 8.1* 8.1*  MG  --  2.0 2.0 2.0  PHOS  --  3.4 3.4 3.4   GFR: Estimated Creatinine Clearance: 48.5 mL/min (by C-G formula based on SCr of 1.23 mg/dL). Liver Function Tests: Recent Labs  Lab 05/15/19 1359 05/15/19 2332 05/17/19 0429 05/18/19 0417  AST 36 34 37 37  ALT 13 14 16 17   ALKPHOS 51 52 46 47  BILITOT 1.1 0.7 0.8 0.8  PROT 6.0* 6.0* 5.3* 5.2*  ALBUMIN 2.7* 2.6* 2.3* 2.3*   No results for input(s): LIPASE, AMYLASE in the last 168 hours. No results for input(s): AMMONIA in the last 168 hours. Coagulation Profile: Recent Labs  Lab 05/15/19 1359  INR 1.2   Cardiac Enzymes: No results for input(s): CKTOTAL, CKMB, CKMBINDEX, TROPONINI in the last 168 hours. BNP (last 3 results) No results for input(s): PROBNP in the last 8760 hours. HbA1C: No results for input(s): HGBA1C in the last 72  hours. CBG: No results for input(s): GLUCAP in the last 168 hours. Lipid Profile: No results for input(s): CHOL, HDL, LDLCALC, TRIG, CHOLHDL, LDLDIRECT in the last 72 hours. Thyroid Function Tests: No results for input(s): TSH, T4TOTAL, FREET4, T3FREE, THYROIDAB in the last 72 hours. Anemia Panel: Recent Labs    05/17/19 0429 05/18/19 0417  FERRITIN 979* 874*   Urine analysis:    Component Value Date/Time   COLORURINE AMBER (A) 05/15/2019 1352   APPEARANCEUR HAZY (A) 05/15/2019 1352   LABSPEC 1.019 05/15/2019 1352   PHURINE 5.0 05/15/2019 1352   GLUCOSEU NEGATIVE 05/15/2019 1352   HGBUR LARGE (A) 05/15/2019 1352   BILIRUBINUR NEGATIVE 05/15/2019 1352   BILIRUBINUR negative 05/10/2019 1428   KETONESUR NEGATIVE 05/15/2019 1352   PROTEINUR 100 (A) 05/15/2019 1352   UROBILINOGEN 0.2 05/10/2019 1428   NITRITE POSITIVE (A) 05/15/2019 1352    LEUKOCYTESUR NEGATIVE 05/15/2019 1352   Sepsis Labs: @LABRCNTIP (procalcitonin:4,lacticidven:4)  ) Recent Results (from the past 240 hour(s))  Urine Culture     Status: None   Collection Time: 05/10/19  2:43 PM   Specimen: Urine, Random  Result Value Ref Range Status   Specimen Description   Final    URINE, RANDOM Performed at [facility], [place] [address] [place], [place] [address]    Special Requests   Final    NONE Performed at [facility], [address] [place], [place] [address]    Culture   Final    NO GROWTH Performed at [facility], [place] [address], [place], [place] [address]    Report Status 05/12/2019 FINAL  Final  Urine culture     Status: None   Collection Time: 05/15/19  1:52 PM   Specimen: In/Out Cath Urine  Result Value Ref Range Status   Specimen Description   Final    IN/OUT CATH URINE Performed at [facility], [address] [place], [place] [address]    Special Requests   Final    NONE Performed at [facility], [address], [place], [place] [address]    Culture   Final    NO GROWTH Performed at [facility], [place] [address] [place], [place] [address]    Report Status 05/16/2019 FINAL  Final  Blood Culture (routine x 2)     Status: None (Preliminary result)   Collection Time: 05/15/19  1:57 PM   Specimen: BLOOD RIGHT HAND  Result Value Ref Range Status   Specimen Description BLOOD RIGHT HAND  Final   Special Requests   Final    BOTTLES DRAWN AEROBIC ONLY Blood Culture adequate volume   Culture   Final    NO GROWTH 3 DAYS Performed at [facility], [address] [place],  [address]    Report Status PENDING  Incomplete  Blood Culture (routine x 2)     Status: None (Preliminary result)   Collection Time: 05/15/19  2:03 PM   Specimen: Right Antecubital; Blood  Result Value Ref Range Status   Specimen Description RIGHT ANTECUBITAL  Final   Special Requests   Final    BOTTLES DRAWN AEROBIC AND ANAEROBIC Blood  Culture adequate volume   Culture   Final    NO GROWTH 3 DAYS Performed at [facility], [address], [place],  [address]    Report Status PENDING  Incomplete      Studies: No results found.  Scheduled Meds: . albuterol  2 puff Inhalation Q8H  . aspirin EC  81 mg Oral Daily  . cholecalciferol  1,000 Units Oral Daily  . clopidogrel  75 mg Oral Daily  . dexamethasone  6 mg Oral Q24H  . enoxaparin (LOVENOX) injection  40 mg Subcutaneous QHS  . ipratropium  2 puff Inhalation Q8H  . pantoprazole  40 mg Oral BID AC  . simvastatin  40 mg Oral Daily  . vitamin C  500 mg Oral Daily  . zinc sulfate  220 mg Oral Daily    Continuous Infusions: . azithromycin 500 mg (05/18/19 1453)  . cefTRIAXone (ROCEPHIN)  IV 1 g (05/18/19 1351)  . remdesivir 100 mg in NS 100 mL 100 mg (05/18/19 1041)     LOS: 3 days     Kayleen Memos, MD [facility] Pager [phone]  If 7PM-7AM, please contact night-coverage [email or web address] Password [facility] 05/18/2019, 3:14 PM

## 2019-05-18 NOTE — Evaluation (Signed)
Occupational Therapy Evaluation Patient Details Name: [name] MRN: [id number] DOB: 30-Dec-1945 Today's Date: 05/18/2019    History of Present Illness 73 y.o. male with past medical history significant for CLL, CAD, BPH and HFrEF who was apparently not feeling very well for the past 4 to 5 days.   Patient was noted to have bilateral lower lobe pneumonias and significant hypoxia with 75% O2 sats on room air.  Patient was given nasal cannula 4 L oxygen with improvement to O2 sats in the 90s with decreased work of breathing.   Covid test subsequently came back positive. admitted for Covid versus community-acquired pneumonia.    Clinical Impression   Pt admitted with above diagnoses, presenting with decreased activity tolerance, increased O2 requirement, and cognitive deficits. Pt provided majority of hx, unsure of accuracy given current cognitive state. He reports living with wife PTA and mostly independent, but using RW since hurting his foot and falling. At time of eval, pt is on 6L HFNC vs portable tank with sats around 88%. He is able to complete bed mobility at min A and transfers with min A +2 with a RW. He has a posterior lean in functional mobility. He was able to complete functional mobility to the sink and complete standing grooming tasks. Sequencing cues needed for safety and efficiency of BADL. Pt also needing to sit in recliner and remain there, too fatigue to complete further mobility. Pt not oriented at this time, and sometimes stating nonsensical stories and having difficulty with word finding. Given his current status, recommend SNF at d/c for continued safe progression with BADL. Will continue to follow per POC listed below.    Follow Up Recommendations  SNF;Supervision/Assistance - 24 hour    Equipment Recommendations  Other (comment)(TBD)    Recommendations for Other Services       Precautions / Restrictions Precautions Precautions: Fall Precaution Comments: reports  falling recently Restrictions Weight Bearing Restrictions: No      Mobility Bed Mobility Overal bed mobility: Needs Assistance Bed Mobility: Supine to Sit     Supine to sit: Min assist     General bed mobility comments: cues needed for sequencing transfer, min A at trunk to pull up into sitting  Transfers Overall transfer level: Needs assistance Equipment used: Rolling walker (2 wheeled) Transfers: Sit to/from Stand Sit to Stand: Min assist;+2 physical assistance;+2 safety/equipment         General transfer comment: min A +2 to stand and steady, pt with posterior LOB and needing close guard and consistent correction due to decrased awareness    Balance Overall balance assessment: Needs assistance Sitting-balance support: Feet supported Sitting balance-Leahy Scale: Fair     Standing balance support: Bilateral upper extremity supported;During functional activity Standing balance-Leahy Scale: Poor Standing balance comment: reliant on external support and therapist assist                           ADL either performed or assessed with clinical judgement   ADL Overall ADL's : Needs assistance/impaired Eating/Feeding: Set up;Sitting   Grooming: Minimal assistance;Standing;Oral care;Cueing for safety;Cueing for sequencing Grooming Details (indicate cue type and reason): min A for posterior LOB while standing at sink. Cues needed for sequencing, specifically with termination and initiation of task Upper Body Bathing: Minimal assistance;Sitting;Cueing for sequencing;Cueing for safety   Lower Body Bathing: Moderate assistance;Sitting/lateral leans;Sit to/from stand;Cueing for sequencing;Cueing for safety   Upper Body Dressing : Cueing for sequencing;Cueing for safety;Minimal assistance  Lower Body Dressing: Moderate assistance;Sitting/lateral leans;Sit to/from stand;Cueing for safety;Cueing for sequencing Lower Body Dressing Details (indicate cue type and  reason): attempting to lean down from standing position to pull up sock without safety awarenes Toilet Transfer: Moderate assistance;Regular Toilet;Ambulation;RW;Grab bars   Toileting- Clothing Manipulation and Hygiene: Maximal assistance;Sitting/lateral lean;Sit to/from stand       Functional mobility during ADLs: Minimal assistance;Rolling walker;Cueing for safety;Cueing for sequencing General ADL Comments: pt limited by cardiopulmonary status, decreased cognition, and poor activity tolerance     Vision Patient Visual Report: No change from baseline       Perception     Praxis      Pertinent Vitals/Pain Pain Assessment: Faces Faces Pain Scale: Hurts a little bit Pain Location: L ankle  Pain Descriptors / Indicators: Aching;Sore Pain Intervention(s): Monitored during session     Hand Dominance     Extremity/Trunk Assessment Upper Extremity Assessment Upper Extremity Assessment: Generalized weakness   Lower Extremity Assessment Lower Extremity Assessment: Generalized weakness       Communication Communication Communication: Expressive difficulties;Other (comment)(difficulty with word finding)   Cognition Arousal/Alertness: Awake/alert Behavior During Therapy: WFL for tasks assessed/performed Overall Cognitive Status: Impaired/Different from baseline Area of Impairment: Orientation;Memory;Following commands;Safety/judgement;Problem solving                 Orientation Level: Disoriented to;Place;Time;Situation   Memory: Decreased short-term memory(cannot recall place few minutes after being reoriented) Following Commands: Follows one step commands with increased time Safety/Judgement: Decreased awareness of safety;Decreased awareness of deficits   Problem Solving: Slow processing;Decreased initiation;Difficulty sequencing;Requires verbal cues;Requires tactile cues General Comments: pt needing multimodal cueing to safely complete tasks. While brushing teeth,  pt having difficulty with initiation and termination of activity. He remains largely disoriented of current situation, time, and place   General Comments       Exercises     Shoulder Instructions      Home Living Family/patient expects to be discharged to:: Private residence Living Arrangements: Spouse/significant other Available Help at Discharge: Family;Other (Comment)(stated wife is sick) Type of Home: House Home Access: Stairs to enter [employer] of Steps: 2 Entrance Stairs-Rails: Right;Left Home Layout: Two level;Able to live on main level with bedroom/bathroom     Bathroom Shower/Tub: Tub/shower unit         Home Equipment: Environmental consultant - 2 wheels          Prior Functioning/Environment Level of Independence: Needs assistance  Gait / Transfers Assistance Needed: walks with RW due to foot injury ADL's / Homemaking Assistance Needed: reports wife assists with dressing   Comments: pt remains confused, unsure of entire accuracy of home set up        OT Problem List: Decreased strength;Decreased knowledge of use of DME or AE;Decreased activity tolerance;Decreased cognition;Cardiopulmonary status limiting activity;Impaired balance (sitting and/or standing);Decreased safety awareness      OT Treatment/Interventions: Self-care/ADL training;Therapeutic exercise;Patient/family education;Balance training;Energy conservation;Therapeutic activities;DME and/or AE instruction;Cognitive remediation/compensation    OT Goals(Current goals can be found in the care plan section) Acute Rehab OT Goals Patient Stated Goal: to brush my teeth OT Goal Formulation: With patient Time For Goal Achievement: 06/01/19 Potential to Achieve Goals: Good  OT Frequency: Min 2X/week   Barriers to D/C:            Co-evaluation PT/OT/SLP Co-Evaluation/Treatment: Yes Reason for Co-Treatment: Necessary to address cognition/behavior during functional activity;For patient/therapist  safety;To address functional/ADL transfers PT goals addressed during session: Mobility/safety with mobility;Proper use of DME OT goals addressed during session: ADL's  and self-care;Proper use of Adaptive equipment and DME      AM-PAC OT "6 Clicks" Daily Activity     Outcome Measure Help from another person eating meals?: A Little Help from another person taking care of personal grooming?: A Little Help from another person toileting, which includes using toliet, bedpan, or urinal?: A Lot Help from another person bathing (including washing, rinsing, drying)?: A Lot Help from another person to put on and taking off regular upper body clothing?: A Little Help from another person to put on and taking off regular lower body clothing?: A Lot 6 Click Score: 15   End of Session Equipment Utilized During Treatment: Gait belt;Rolling walker;Oxygen Nurse Communication: Mobility status  Activity Tolerance: Patient tolerated treatment well Patient left: in chair;with call bell/phone within reach;with chair alarm set;with restraints reapplied;Other (comment)(took off one mitt so pt could use all bell, informed RN)  OT Visit Diagnosis: Unsteadiness on feet (R26.81);Other abnormalities of gait and mobility (R26.89);History of falling (Z91.81);Muscle weakness (generalized) (M62.81);Other symptoms and signs involving cognitive function                Time: 1105-1140 OT Time Calculation (min): 35 min Charges:  OT General Charges $OT Visit: 1 Visit OT Evaluation $OT Eval Moderate Complexity: 1 [place], MSOT, OTR/L [employer] OT/ Acute Relief OT [facility] Office: [phone]  Zenovia Jarred 05/18/2019, 1:28 PM

## 2019-05-19 ENCOUNTER — Inpatient Hospital Stay (HOSPITAL_COMMUNITY): Payer: Medicare HMO

## 2019-05-19 LAB — D-DIMER, QUANTITATIVE: D-Dimer, Quant: 2.15 ug/mL-FEU — ABNORMAL HIGH (ref 0.00–0.50)

## 2019-05-19 LAB — PROCALCITONIN: Procalcitonin: 0.27 ng/mL

## 2019-05-19 LAB — COMPREHENSIVE METABOLIC PANEL
ALT: 20 U/L (ref 0–44)
AST: 40 U/L (ref 15–41)
Albumin: 2.1 g/dL — ABNORMAL LOW (ref 3.5–5.0)
Alkaline Phosphatase: 50 U/L (ref 38–126)
Anion gap: 9 (ref 5–15)
BUN: 32 mg/dL — ABNORMAL HIGH (ref 8–23)
CO2: 20 mmol/L — ABNORMAL LOW (ref 22–32)
Calcium: 8 mg/dL — ABNORMAL LOW (ref 8.9–10.3)
Chloride: 110 mmol/L (ref 98–111)
Creatinine, Ser: 1.19 mg/dL (ref 0.61–1.24)
GFR calc Af Amer: >60 mL/min (ref >60)
GFR calc non Af Amer: 59 mL/min — ABNORMAL LOW (ref >60)
Glucose, Bld: 171 mg/dL — ABNORMAL HIGH (ref 70–99)
Potassium: 4.7 mmol/L (ref 3.5–5.1)
Sodium: 139 mmol/L (ref 135–145)
Total Bilirubin: 0.9 mg/dL (ref 0.3–1.2)
Total Protein: 4.9 g/dL — ABNORMAL LOW (ref 6.5–8.1)

## 2019-05-19 LAB — CBC WITH DIFFERENTIAL/PLATELET
Abs Immature Granulocytes: 0.08 10*3/uL — ABNORMAL HIGH (ref 0.00–0.07)
Basophils Absolute: 0.1 10*3/uL (ref 0.0–0.1)
Basophils Relative: 0 %
Eosinophils Absolute: 0 10*3/uL (ref 0.0–0.5)
Eosinophils Relative: 0 %
HCT: 36.7 % — ABNORMAL LOW (ref 39.0–52.0)
Hemoglobin: 11.7 g/dL — ABNORMAL LOW (ref 13.0–17.0)
Immature Granulocytes: 0 %
Lymphocytes Relative: 85 %
Lymphs Abs: 37 10*3/uL — ABNORMAL HIGH (ref 0.7–4.0)
MCH: 30 pg (ref 26.0–34.0)
MCHC: 31.9 g/dL (ref 30.0–36.0)
MCV: 94.1 fL (ref 80.0–100.0)
Monocytes Absolute: 0.1 10*3/uL (ref 0.1–1.0)
Monocytes Relative: 0 %
Neutro Abs: 6.5 10*3/uL (ref 1.7–7.7)
Neutrophils Relative %: 15 %
Platelets: 205 10*3/uL (ref 150–400)
RBC: 3.9 MIL/uL — ABNORMAL LOW (ref 4.22–5.81)
RDW: 14.3 % (ref 11.5–15.5)
WBC: 43.8 10*3/uL — ABNORMAL HIGH (ref 4.0–10.5)
nRBC: 0 % (ref 0.0–0.2)

## 2019-05-19 LAB — GLUCOSE, CAPILLARY: Glucose-Capillary: 128 mg/dL — ABNORMAL HIGH (ref 70–99)

## 2019-05-19 LAB — MAGNESIUM: Magnesium: 2.1 mg/dL (ref 1.7–2.4)

## 2019-05-19 LAB — BLOOD GAS, ARTERIAL
Acid-base deficit: 2.7 mmol/L — ABNORMAL HIGH (ref 0.0–2.0)
Bicarbonate: 20.4 mmol/L (ref 20.0–28.0)
FIO2: 44
O2 Saturation: 95.9 %
Patient temperature: 36.7
pCO2 arterial: 28 mmHg — ABNORMAL LOW (ref 32.0–48.0)
pH, Arterial: 7.475 — ABNORMAL HIGH (ref 7.350–7.450)
pO2, Arterial: 79.6 mmHg — ABNORMAL LOW (ref 83.0–108.0)

## 2019-05-19 LAB — FERRITIN: Ferritin: 711 ng/mL — ABNORMAL HIGH (ref 24–336)

## 2019-05-19 LAB — C-REACTIVE PROTEIN: CRP: 5.2 mg/dL — ABNORMAL HIGH (ref <1.0)

## 2019-05-19 LAB — PHOSPHORUS: Phosphorus: 3.3 mg/dL (ref 2.5–4.6)

## 2019-05-19 MED ORDER — ALBUTEROL SULFATE HFA 108 (90 BASE) MCG/ACT IN AERS: 2.0000 | INHALATION_SPRAY | Freq: Four times a day (QID) | RESPIRATORY_TRACT | Status: DC | PRN

## 2019-05-19 MED ORDER — FUROSEMIDE 10 MG/ML IJ SOLN
20.0000 mg | Freq: Once | INTRAMUSCULAR | Status: DC
  Filled 2019-05-19: qty 2

## 2019-05-19 MED ORDER — ALBUTEROL SULFATE HFA 108 (90 BASE) MCG/ACT IN AERS
2.0000 | INHALATION_SPRAY | Freq: Three times a day (TID) | RESPIRATORY_TRACT | Status: DC
  Administered 2019-05-19 – 2019-05-29 (×31): 2 via RESPIRATORY_TRACT
  Filled 2019-05-19: qty 6.7

## 2019-05-19 MED ORDER — IOHEXOL 350 MG/ML SOLN
75.0000 mL | Freq: Once | INTRAVENOUS | Status: AC | PRN
  Administered 2019-05-19: 75 mL via INTRAVENOUS

## 2019-05-19 MED ORDER — FUROSEMIDE 10 MG/ML IJ SOLN
20.0000 mg | Freq: Once | INTRAMUSCULAR | Status: AC
  Administered 2019-05-19: 20 mg via INTRAVENOUS
  Filled 2019-05-19: qty 2

## 2019-05-19 MED ORDER — IPRATROPIUM BROMIDE HFA 17 MCG/ACT IN AERS
2.0000 | INHALATION_SPRAY | Freq: Three times a day (TID) | RESPIRATORY_TRACT | Status: DC
  Administered 2019-05-19 – 2019-05-29 (×31): 2 via RESPIRATORY_TRACT
  Filled 2019-05-19 (×2): qty 12.9

## 2019-05-19 NOTE — Progress Notes (Addendum)
PROGRESS NOTE  [name] [id number] DOB: [birth year] DOA: 05/15/2019 PCP: [name], MD  HPI/Recap of past 24 hours: [name] is an 73 y.o. male with past medical history significant for CLL, CAD, BPH and HFrEF 40 to 45% (2017) who was apparently not feeling well for the past 4 to 5 days.  Poor historian.  Denies chest pain or palpitations.  Of note patient was seen last week for dysuria and UA was negative at that time.  He was treated with Pyridium and Flomax with instructions to follow-up with urology.  ED Course:  Noted to meet criteria for sepsis with fever, tachypnea, tachycardia and leukocytosis.  Bilateral lower lobe pneumonias and significant hypoxia with 75% O2 sats on room air.  COVID-19 positive on 05/15/2019. Inflammatory markers are quite elevated with CRP of 18.5 although procalcitonin is also elevated at 1.12.  Admitted for acute COVID-19 viral pneumonia superimposed by community-acquired pneumonia.  Started on remdesivir, Decadron and IV antibiotics empirically.  05/19/19: Seen and examined.  O2 requirement increased overnight.  On 10 L high flow nasal cannula with O2 saturation in the low 90s.  He is alert but confused in a state of suspected dementia.  Per his wife patient has pain forgetful for a while.    Assessment/Plan: Principal Problem:   Sepsis ([place]) Active Problems:   CLL (chronic lymphocytic leukemia) (HCC)   BPH (benign prostatic hyperplasia)   CAD S/P PCI-- RCA DES 2014,  CFX DES Jan 2017   PTSD (post-traumatic stress disorder)   Benign essential HTN   Ischemic cardiomyopathy   GERD (gastroesophageal reflux disease)   Esophageal dysphagia   Community acquired pneumonia   Pneumonia due to COVID-19 virus   COVID-19  Sepsis 2/2 Acute COVID-19 viral pneumonia superimposed by community-acquired pneumonia Presented with significant hypoxia, fever and leukocytosis  COVID-19 positive on 05/15/2019.   Procalcitonin greater than 1.1 Sepsis  criteria is resolving Inflammatory markers are trending down but D-dimer is trending up. Continue to trend inflammatory markers. Independently viewed chest x-ray done on 05/18/2020 showed worsening pulmonary infiltrates on the left lung. Completed 5 days of remdesivir Currently on Decadron O2 requirement increased overnight. Continue to maintain O2 saturation greater than 92% Continue bronchodilators Continue inhalers 3 times daily and incentive spirometer Continue vitamin C, D3 and zinc  Acute hypoxic respiratory failure secondary to acute COVID-19 viral pneumonia Not on oxygen supplementation at baseline Management as stated above Rule out pulmonary embolism in the setting of COVID-19 infection and CLL. Elevated D-dimer trending up, elevated fibrinogen>> Obtain CTA chest to rule out pulmonary embolism  AKI on CKD 3A improving, likely prerenal in the setting of diarrhea and poor oral intake Appears to be at his baseline with creatinine of 1.1 with GFR 59 Presented with creatinine of 1.7 with GFR 39 Continue to avoid nephrotoxins-continue to monitor daily renal panel.  Diarrhea likely related to acute COVID-19 infection Treat symptomatically, improved.  Mild non-anion gap metabolic acidosis Chemistry bicarb 20 Anion gap 9 Give sodium bicarb 1300 mg daily x2 days. Repeat renal panel in the morning  Resolved post repletion: Hypokalemia Potassium 4.7 on 05/19/2019.  Resolved hypotension Blood pressure is at goal. Maintain MAP greater than 65.  Chronic systolic CHF  euvolemic on exam Last 2D echo done on 2017 showed LVEF 40 to 45% Continue strict I's and O's and daily weight Continue to hold off Lasix and lisinopril for now due to soft blood pressures and recent AKI  LUTS Flomax on hold to  avoid hypotension Resume Pyridium if becomes symptomatic  GERD Continue Protonix  Coronary artery disease Continue aspirin, Plavix and Zocor Denies any anginal symptoms at the time  of this visit.  BPH Resume tamsulosin when appropriate Holding to avoid hypotension  CLL  Immunocompromised Closely monitor Will need to follow-up with oncology at discharge  Physical debility/ambulatory dysfunction PT assessment recommended SNF CSW consulted to assist with placement. Spoke with his wife on 05/18/2019 via phone she would prefer SNF in [place] due to closeness to home. Continue PT OT with assistance and fall precautions.  DVT prophylaxis: Lovenox subcu daily Code Status: Full code. Family Communication:  None at bedside.   Disposition Plan:  Possible discharge to SNF when bed is available   Consults called: None   Objective: Vitals:   05/19/19 0447 05/19/19 0500 05/19/19 0800 05/19/19 1200  BP: 140/73     Pulse: (!) 58     Resp: (!) 34     Temp:  97.8 F (36.6 C) 97.8 F (36.6 C) (!) 97.5 F (36.4 C)  TempSrc:  Oral Oral Axillary  SpO2: 93%     Weight:      Height:        Intake/Output Summary (Last 24 hours) at 05/19/2019 1247 Last data filed at 05/19/2019 1202 Gross per 24 hour  Intake 480 ml  Output 1400 ml  Net -920 ml   Filed Weights   05/16/19 0548 05/17/19 0400 05/18/19 0445  Weight: 68.1 kg 67.6 kg 68.6 kg    Exam:  . General: 73 y.o. year-old male further evaluation no acute distress.  Alert and confused in a state of suspected dementia. . Cardiovascular: Regular rate and rhythm no rubs or gallops.  No JVD or thyromegaly noted. Marland Kitchen Respiratory: Mild rales at bases. No wheezing noted.  Poor inspiratory effort. . Abdomen: Soft nontender nondistended no bowel sounds present.   . Musculoskeletal: Trace lower extremity edema bilaterally. Psychiatry: Mood is appropriate for condition and setting.  Data Reviewed: CBC: Recent Labs  Lab 05/15/19 1359 05/15/19 2332 05/17/19 0429 05/18/19 0417 05/19/19 0352  WBC 39.2* 36.1* 45.7* 45.2* 43.8*  NEUTROABS 6.2 4.9 8.4* 8.0* 6.5  HGB 12.0* 12.3* 12.1* 12.2* 11.7*  HCT 38.6* 40.0  38.5* 39.0 36.7*  MCV 95.8 98.0 95.1 94.0 94.1  PLT 139* 141* 170 186 [id number]   Basic Metabolic Panel: Recent Labs  Lab 05/15/19 1359 05/15/19 2332 05/17/19 0429 05/18/19 0417 05/19/19 0352  NA 133* 136 137 139 139  K 3.4* 3.8 4.4 4.6 4.7  CL 100 104 109 110 110  CO2 20* 20* 19* 20* 20*  GLUCOSE 130* 176* 176* 170* 171*  BUN 28* 29* 34* 32* 32*  CREATININE 1.70* 1.53* 1.41* 1.23 1.19  CALCIUM 7.8* 7.7* 8.1* 8.1* 8.0*  MG  --  2.0 2.0 2.0 2.1  PHOS  --  3.4 3.4 3.4 3.3   GFR: Estimated Creatinine Clearance: 50.1 mL/min (by C-G formula based on SCr of 1.19 mg/dL). Liver Function Tests: Recent Labs  Lab 05/15/19 1359 05/15/19 2332 05/17/19 0429 05/18/19 0417 05/19/19 0352  AST 36 34 37 37 40  ALT 13 14 16 17 20   ALKPHOS 51 52 46 47 50  BILITOT 1.1 0.7 0.8 0.8 0.9  PROT 6.0* 6.0* 5.3* 5.2* 4.9*  ALBUMIN 2.7* 2.6* 2.3* 2.3* 2.1*   No results for input(s): LIPASE, AMYLASE in the last 168 hours. No results for input(s): AMMONIA in the last 168 hours. Coagulation Profile: Recent Labs  Lab 05/15/19 1359  INR 1.2   Cardiac Enzymes: No results for input(s): CKTOTAL, CKMB, CKMBINDEX, TROPONINI in the last 168 hours. BNP (last 3 results) No results for input(s): PROBNP in the last 8760 hours. HbA1C: No results for input(s): HGBA1C in the last 72 hours. CBG: No results for input(s): GLUCAP in the last 168 hours. Lipid Profile: No results for input(s): CHOL, HDL, LDLCALC, TRIG, CHOLHDL, LDLDIRECT in the last 72 hours. Thyroid Function Tests: No results for input(s): TSH, T4TOTAL, FREET4, T3FREE, THYROIDAB in the last 72 hours. Anemia Panel: Recent Labs    05/18/19 0417 05/19/19 0352  FERRITIN 874* 711*   Urine analysis:    Component Value Date/Time   COLORURINE AMBER (A) 05/15/2019 1352   APPEARANCEUR HAZY (A) 05/15/2019 1352   LABSPEC 1.019 05/15/2019 1352   PHURINE 5.0 05/15/2019 1352   GLUCOSEU NEGATIVE 05/15/2019 1352   HGBUR LARGE (A) 05/15/2019 1352    BILIRUBINUR NEGATIVE 05/15/2019 1352   BILIRUBINUR negative 05/10/2019 1428   KETONESUR NEGATIVE 05/15/2019 1352   PROTEINUR 100 (A) 05/15/2019 1352   UROBILINOGEN 0.2 05/10/2019 1428   NITRITE POSITIVE (A) 05/15/2019 1352   LEUKOCYTESUR NEGATIVE 05/15/2019 1352   Sepsis Labs: @LABRCNTIP (procalcitonin:4,lacticidven:4)  ) Recent Results (from the past 240 hour(s))  Urine Culture     Status: None   Collection Time: 05/10/19  2:43 PM   Specimen: Urine, Random  Result Value Ref Range Status   Specimen Description   Final    URINE, RANDOM Performed at [facility], [place] [address] [place], [place] [address]    Special Requests   Final    NONE Performed at [facility], [address], [place], [place] [address]    Culture   Final    NO GROWTH Performed at [facility], [name] [address], [place], [place] [address]    Report Status 05/12/2019 FINAL  Final  Urine culture     Status: None   Collection Time: 05/15/19  1:52 PM   Specimen: In/Out Cath Urine  Result Value Ref Range Status   Specimen Description   Final    IN/OUT CATH URINE Performed at [facility], [address] [place], [place] [address]    Special Requests   Final    NONE Performed at [facility], [address] [place], [place] [address]    Culture   Final    NO GROWTH Performed at [facility], [place] [address], [place], [place] [address]    Report Status 05/16/2019 FINAL  Final  Blood Culture (routine x 2)     Status: None (Preliminary result)   Collection Time: 05/15/19  1:57 PM   Specimen: BLOOD RIGHT HAND  Result Value Ref Range Status   Specimen Description BLOOD RIGHT HAND  Final   Special Requests   Final    BOTTLES DRAWN AEROBIC ONLY Blood Culture adequate volume   Culture   Final    NO GROWTH 3 DAYS Performed at [facility], [address], [place], [place] [address]    Report Status PENDING  Incomplete  Blood Culture (routine x 2)     Status: None (Preliminary result)    Collection Time: 05/15/19  2:03 PM   Specimen: Right Antecubital; Blood  Result Value Ref Range Status   Specimen Description RIGHT ANTECUBITAL  Final   Special Requests   Final    BOTTLES DRAWN AEROBIC AND ANAEROBIC Blood Culture adequate volume   Culture   Final    NO GROWTH 3 DAYS  Performed at [facility], [address] [place], [place] [address]    Report Status PENDING  Incomplete      Studies: DG CHEST PORT 1 VIEW  Result Date: 05/19/2019 CLINICAL DATA:  73 year old male positive COVID-19. Shortness of breath and chest pain. EXAM: PORTABLE CHEST 1 VIEW COMPARISON:  Portable chest 05/15/2019 and earlier. FINDINGS: Portable AP semi upright view at 0805 hours. Increasing asymmetric bilateral pulmonary interstitial opacity more pronounced on the left. Stable lung volumes. Mediastinal contours remain normal. Visualized tracheal air column is within normal limits. No pneumothorax or pleural effusion identified. Stable visualized osseous structures. Negative visible bowel gas pattern. IMPRESSION: Radiographically progressed COVID-19 pneumonia since 05/15/2019, greater on the left. Electronically Signed   By: Genevie Ann M.D.   On: 05/19/2019 12:18    Scheduled Meds: . aspirin EC  81 mg Oral Daily  . cholecalciferol  1,000 Units Oral Daily  . clopidogrel  75 mg Oral Daily  . dexamethasone  6 mg Oral Q24H  . enoxaparin (LOVENOX) injection  40 mg Subcutaneous QHS  . ipratropium  2 puff Inhalation Q8H  . pantoprazole  40 mg Oral BID AC  . simvastatin  40 mg Oral Daily  . vitamin C  500 mg Oral Daily  . zinc sulfate  220 mg Oral Daily    Continuous Infusions: . azithromycin 500 mg (05/18/19 1453)     LOS: 4 days     Kayleen Memos, MD [facility] Pager [phone]  If 7PM-7AM, please contact night-coverage [email or web address] Password [facility] 05/19/2019, 12:47 PM

## 2019-05-19 NOTE — Progress Notes (Signed)
Updated the patient's wife [name] via phone. All questions answered to her satisfaction.

## 2019-05-19 NOTE — NC FL2 (Addendum)
[place] LEVEL OF CARE SCREENING TOOL     IDENTIFICATION  Patient Name: [name] Birthdate: 10/24/1945 Sex: male Admission Date (Current Location): 05/15/2019  [facility] and [place] Number:  Herbalist and Address:  [facility] [place], [place] [address], [place], [place] [address]      Provider Number: [id number]  Attending Physician Name and Address:  Kayleen Memos, DO  Relative Name and Phone Number:  Dorvin Krebs [phone]    Current Level of Care: Hospital Recommended Level of Care: [place] Prior Approval Number:    Date Approved/Denied:   PASRR Number: [id number]  Discharge Plan: SNF    Current Diagnoses: Patient Active Problem List   Diagnosis Date Noted  . Sepsis ([place]) 05/15/2019  . Community acquired pneumonia 05/15/2019  . Pneumonia due to COVID-19 virus 05/15/2019  . COVID-19 05/15/2019  . Abdominal pain 03/06/2018  . Constipation 03/06/2018  . GERD (gastroesophageal reflux disease) 03/08/2016  . Esophageal dysphagia 03/08/2016  . Chronic cough 03/08/2016  . Ischemic cardiomyopathy   . STEMI - 06/16/15 06/16/2015  . Benign essential HTN 06/16/2015  . Myocardial infarction of inferolateral wall ([place]) 06/16/2015  . Cholelithiasis without cholecystitis 12/18/2013  . Hematochezia 06/04/2013  . Lower leg edema 03/14/2013  . PTSD (post-traumatic stress disorder)   . CAD S/P PCI-- RCA DES 2014,  CFX DES Jan 2017 11/24/2012  . NSTEMI (non-ST elevated myocardial infarction) - history of 11/23/2012  . Hyperglycemia 11/23/2012  . History of smoking 11/23/2012  . Dyslipidemia, goal LDL below 70 - on simvastatin; monitored by PCP 11/23/2012  . Diverticulitis of colon without hemorrhage- on antibiotics for recent flair up 09/19/2012  . Hx of adenomatous colonic polyps 09/19/2012  . CLL (chronic lymphocytic leukemia) ([place]) 04/08/2011  . BPH (benign prostatic hyperplasia) 04/08/2011    Orientation  RESPIRATION BLADDER Height & Weight     Self, Time, Situation, Place  O2(nasal cannula, non breather mask 15L) Continent, External catheter Weight: 151 lb 3.8 oz (68.6 kg) Height:  5\' 7"  (170.2 cm)  BEHAVIORAL SYMPTOMS/MOOD NEUROLOGICAL BOWEL NUTRITION STATUS      Continent Diet(please see discharge summary)  AMBULATORY STATUS COMMUNICATION OF NEEDS Skin     Verbally (Ecchymosis leg,bilateral)                       Personal Care Assistance Level of Assistance  Bathing, Feeding, Dressing Bathing Assistance: Limited assistance Feeding assistance: Independent Dressing Assistance: Limited assistance     Functional Limitations Info  Sight, Hearing, Speech Sight Info: Adequate Hearing Info: Adequate Speech Info: Adequate    SPECIAL CARE FACTORS FREQUENCY  PT (By licensed PT), OT (By licensed OT)     PT Frequency: 3xper week OT Frequency: 3x per week            Contractures Contractures Info: Not present    Additional Factors Info  Code Status, Allergies Code Status Info: FULL Allergies Info: NKA           Current Medications (05/19/2019):  This is the current hospital active medication list Current Facility-Administered Medications  Medication Dose Route Frequency Provider Last Rate Last Admin  . acetaminophen (TYLENOL) tablet 650 mg  650 mg Oral Q6H PRN Vashti Hey, MD   650 mg at 05/16/19 2208  . albuterol (VENTOLIN HFA) 108 (90 Base) MCG/ACT inhaler 2 puff  2 puff Inhalation Q6H PRN Blount, Lolita Cram, NP      . aspirin EC  tablet 81 mg  81 mg Oral Daily Bonnell Public Tublu, MD   81 mg at 05/19/19 1006  . azithromycin (ZITHROMAX) 500 mg in sodium chloride 0.9 % 250 mL IVPB  500 mg Intravenous Q24H Irene Pap N, DO 250 mL/hr at 05/19/19 1341 500 mg at 05/19/19 1341  . cholecalciferol (VITAMIN D3) tablet 1,000 Units  1,000 Units Oral Daily Irene Pap N, DO   1,000 Units at 05/19/19 1006  . clopidogrel (PLAVIX) tablet 75 mg  75 mg Oral Daily  Bonnell Public Tublu, MD   75 mg at 05/19/19 1006  . dexamethasone (DECADRON) tablet 6 mg  6 mg Oral Q24H Bonnell Public Tublu, MD   6 mg at 05/18/19 2213  . enoxaparin (LOVENOX) injection 40 mg  40 mg Subcutaneous QHS Bonnell Public Tublu, MD   40 mg at 05/18/19 2213  . ipratropium (ATROVENT HFA) inhaler 2 puff  2 puff Inhalation Q8H Hall, Carole N, DO   2 puff at 05/19/19 1203  . ondansetron (ZOFRAN) tablet 4 mg  4 mg Oral Q6H PRN Vashti Hey, MD       Or  . ondansetron [facility]) injection 4 mg  4 mg Intravenous Q6H PRN Bonnell Public Tublu, MD      . pantoprazole (PROTONIX) EC tablet 40 mg  40 mg Oral BID AC Bonnell Public Tublu, MD   40 mg at 05/19/19 1006  . simvastatin (ZOCOR) tablet 40 mg  40 mg Oral Daily Bonnell Public Tublu, MD   40 mg at 05/18/19 1802  . vitamin C (ASCORBIC ACID) tablet 500 mg  500 mg Oral Daily Irene Pap N, DO   500 mg at 05/19/19 1006  . zinc sulfate capsule 220 mg  220 mg Oral Daily Irene Pap N, DO   220 mg at 05/19/19 1006     Discharge Medications: Please see discharge summary for a list of discharge medications.  Relevant Imaging Results:  Relevant Lab Results:   Additional Information SSN [id number]  Covid Positive on 05/15/19  Vinie Sill, LCSWA

## 2019-05-19 NOTE — Progress Notes (Signed)
Pt desat into the high 80's on 4L. Placed nonreb on with 15L. Pt O2 sats increased to the low 90's. Np was paged. Will continues to monitor throughout this shift.

## 2019-05-19 NOTE — Progress Notes (Signed)
No evidence of PE on CTA chest to justify worsening hypoxia.  Continue aggressive pulmonary toilet. Bronchodilators TID, flutter valve q4h while awake. Judicious IV lasix as tolerated.

## 2019-05-19 NOTE — Progress Notes (Signed)
   Vital Signs MEWS/VS Documentation      05/19/2019 0021 05/19/2019 0400 05/19/2019 0447 05/19/2019 0500   MEWS Score:  2  1  2  2    MEWS Score Color:  Yellow  Green  Yellow  Yellow   Resp:  (!) 26  (!) 21  (!) 34  --   Pulse:  (!) 56  62  (!) 58  --   BP:  111/62  130/61  140/73  --   Temp:  97.8 F (36.6 C)  --  --  97.8 F (36.6 C)   O2 Device:  Nasal Cannula  Nasal Cannula  Non-rebreather Mask  --   O2 Flow Rate (L/min):  3 L/min  6 L/min  15 L/min  --   Level of Consciousness:  --  Alert  Alert  --    NP notified        Fritzie Prioleau H Saagar Tortorella 05/19/2019,5:29 AM

## 2019-05-19 NOTE — TOC Initial Note (Signed)
Transition of Care [facility]) - Initial/Assessment Note    Patient Details  Name: [name] MRN: [id number] Date of Birth: [birth year]  Transition of Care [facility]) CM/SW Contact:    Gabrielle Dare Phone Number: 05/19/2019, 1:48 PM  Clinical Narrative:                 Spoke with pt's spouse on the phone.  Pt is oriented to person only. Patient came from home via EMS to [facility] on December 9th and was transported to [facility] on December 10th.  Pt's spouse stated that she does not drive and would like for spouse to be near where they live.  Pt's spouse would like for pt to receive rehab at [facility] or [place].  If there are no beds available at the select SNF's pt's spouse would like HH.   Expected Discharge Plan: [place]     Patient Goals and CMS Choice  Spouse would like for pt to come home after rehab.      Expected Discharge Plan and Services Expected Discharge Plan: [place] arrangements for the past 2 months: Single Family Home                                      Prior Living Arrangements/Services Living arrangements for the past 2 months: Single Family Home Lives with:: Spouse Patient language and need for interpreter reviewed:: No        Need for Family Participation in Patient Care: Yes (Comment) Care giver support system in place?: Yes (comment)(wife)   Criminal Activity/Legal Involvement Pertinent to Current Situation/Hospitalization: No - Comment as needed  Activities of Daily Living      Permission Sought/Granted                  Emotional Assessment   Attitude/Demeanor/Rapport: Gracious, Charismatic Affect (typically observed): Accepting Orientation: : Oriented to Self, Oriented to Place, Oriented to Situation      Admission diagnosis:  Disorientation [R41.0] Community acquired pneumonia, unspecified laterality [J18.9] Sepsis, due to unspecified organism, unspecified  whether acute organ dysfunction present ([place]) [A41.9] COVID-19 virus detected [U07.1] COVID-19 [U07.1] Patient Active Problem List   Diagnosis Date Noted  . Sepsis ([place]) 05/15/2019  . Community acquired pneumonia 05/15/2019  . Pneumonia due to COVID-19 virus 05/15/2019  . COVID-19 05/15/2019  . Abdominal pain 03/06/2018  . Constipation 03/06/2018  . GERD (gastroesophageal reflux disease) 03/08/2016  . Esophageal dysphagia 03/08/2016  . Chronic cough 03/08/2016  . Ischemic cardiomyopathy   . STEMI - 06/16/15 06/16/2015  . Benign essential HTN 06/16/2015  . Myocardial infarction of inferolateral wall ([place]) 06/16/2015  . Cholelithiasis without cholecystitis 12/18/2013  . Hematochezia 06/04/2013  . Lower leg edema 03/14/2013  . PTSD (post-traumatic stress disorder)   . CAD S/P PCI-- RCA DES 2014,  CFX DES Jan 2017 11/24/2012  . NSTEMI (non-ST elevated myocardial infarction) - history of 11/23/2012  . Hyperglycemia 11/23/2012  . History of smoking 11/23/2012  . Dyslipidemia, goal LDL below 70 - on simvastatin; monitored by PCP 11/23/2012  . Diverticulitis of colon without hemorrhage- on antibiotics for recent flair up 09/19/2012  . Hx of adenomatous colonic polyps 09/19/2012  . CLL (chronic lymphocytic leukemia) ([place]) 04/08/2011  . BPH (benign prostatic hyperplasia) 04/08/2011   PCP:  [name], MD Pharmacy:   [id number] -  [place], [place] [address] [place] [address] Phone: [phone] Fax: [phone]     Social Determinants of Health (SDOH) Interventions    Readmission Risk Interventions Readmission Risk Prevention Plan 05/17/2019  Transportation Screening Complete  HRI or Home Care Consult (No Data)  Some recent data might be hidden

## 2019-05-20 ENCOUNTER — Ambulatory Visit (INDEPENDENT_AMBULATORY_CARE_PROVIDER_SITE_OTHER): Payer: Medicare HMO | Admitting: Internal Medicine

## 2019-05-20 LAB — CULTURE, BLOOD (ROUTINE X 2)
Culture: NO GROWTH
Culture: NO GROWTH
Special Requests: ADEQUATE
Special Requests: ADEQUATE

## 2019-05-20 LAB — CBC WITH DIFFERENTIAL/PLATELET
Abs Immature Granulocytes: 0.09 10*3/uL — ABNORMAL HIGH (ref 0.00–0.07)
Basophils Absolute: 0.1 10*3/uL (ref 0.0–0.1)
Basophils Relative: 0 %
Eosinophils Absolute: 0 10*3/uL (ref 0.0–0.5)
Eosinophils Relative: 0 %
HCT: 40.7 % (ref 39.0–52.0)
Hemoglobin: 12.9 g/dL — ABNORMAL LOW (ref 13.0–17.0)
Immature Granulocytes: 0 %
Lymphocytes Relative: 86 %
Lymphs Abs: 37.1 10*3/uL — ABNORMAL HIGH (ref 0.7–4.0)
MCH: 29.5 pg (ref 26.0–34.0)
MCHC: 31.7 g/dL (ref 30.0–36.0)
MCV: 92.9 fL (ref 80.0–100.0)
Monocytes Absolute: 0.3 10*3/uL (ref 0.1–1.0)
Monocytes Relative: 1 %
Neutro Abs: 5.5 10*3/uL (ref 1.7–7.7)
Neutrophils Relative %: 13 %
Platelets: 232 10*3/uL (ref 150–400)
RBC: 4.38 MIL/uL (ref 4.22–5.81)
RDW: 14.3 % (ref 11.5–15.5)
WBC: 42.6 10*3/uL — ABNORMAL HIGH (ref 4.0–10.5)
nRBC: 0 % (ref 0.0–0.2)

## 2019-05-20 LAB — PHOSPHORUS: Phosphorus: 4.3 mg/dL (ref 2.5–4.6)

## 2019-05-20 LAB — COMPREHENSIVE METABOLIC PANEL
ALT: 20 U/L (ref 0–44)
AST: 34 U/L (ref 15–41)
Albumin: 2.3 g/dL — ABNORMAL LOW (ref 3.5–5.0)
Alkaline Phosphatase: 53 U/L (ref 38–126)
Anion gap: 11 (ref 5–15)
BUN: 38 mg/dL — ABNORMAL HIGH (ref 8–23)
CO2: 19 mmol/L — ABNORMAL LOW (ref 22–32)
Calcium: 8.3 mg/dL — ABNORMAL LOW (ref 8.9–10.3)
Chloride: 109 mmol/L (ref 98–111)
Creatinine, Ser: 1.52 mg/dL — ABNORMAL HIGH (ref 0.61–1.24)
GFR calc Af Amer: 51 mL/min — ABNORMAL LOW (ref >60)
GFR calc non Af Amer: 44 mL/min — ABNORMAL LOW (ref >60)
Glucose, Bld: 171 mg/dL — ABNORMAL HIGH (ref 70–99)
Potassium: 4.9 mmol/L (ref 3.5–5.1)
Sodium: 139 mmol/L (ref 135–145)
Total Bilirubin: 0.8 mg/dL (ref 0.3–1.2)
Total Protein: 5.3 g/dL — ABNORMAL LOW (ref 6.5–8.1)

## 2019-05-20 LAB — C-REACTIVE PROTEIN: CRP: 4.4 mg/dL — ABNORMAL HIGH (ref <1.0)

## 2019-05-20 LAB — D-DIMER, QUANTITATIVE: D-Dimer, Quant: 2.35 ug/mL-FEU — ABNORMAL HIGH (ref 0.00–0.50)

## 2019-05-20 LAB — MAGNESIUM: Magnesium: 2.2 mg/dL (ref 1.7–2.4)

## 2019-05-20 LAB — FERRITIN: Ferritin: 855 ng/mL — ABNORMAL HIGH (ref 24–336)

## 2019-05-20 MED ORDER — FUROSEMIDE 20 MG PO TABS
20.0000 mg | ORAL_TABLET | Freq: Every day | ORAL | Status: DC
  Administered 2019-05-20 – 2019-05-21 (×2): 20 mg via ORAL
  Filled 2019-05-20 (×2): qty 1

## 2019-05-20 MED ORDER — TAMSULOSIN HCL 0.4 MG PO CAPS
0.4000 mg | ORAL_CAPSULE | Freq: Every day | ORAL | Status: DC
  Administered 2019-05-20 – 2019-05-29 (×9): 0.4 mg via ORAL
  Filled 2019-05-20 (×9): qty 1

## 2019-05-20 MED ORDER — SODIUM BICARBONATE 650 MG PO TABS
1300.0000 mg | ORAL_TABLET | Freq: Two times a day (BID) | ORAL | Status: DC
  Administered 2019-05-20 – 2019-05-22 (×5): 1300 mg via ORAL
  Filled 2019-05-20 (×5): qty 2

## 2019-05-20 MED ORDER — ORAL CARE MOUTH RINSE
15.0000 mL | Freq: Two times a day (BID) | OROMUCOSAL | Status: DC
  Administered 2019-05-20 – 2019-05-29 (×17): 15 mL via OROMUCOSAL

## 2019-05-20 NOTE — Progress Notes (Signed)
PROGRESS NOTE  [name] [id number] DOB: [birth year] DOA: 05/15/2019 PCP: [name], MD  HPI/Recap of past 24 hours: [name] is an 73 y.o. male with past medical history significant for CLL, CAD, BPH and HFrEF 40 to 45% (2017) who was apparently not feeling well for the past 4 to 5 days.  Poor historian.  Denies chest pain or palpitations.  Of note patient was seen last week for dysuria and UA was negative at that time.  He was treated with Pyridium and Flomax with instructions to follow-up with urology.  ED Course:  Noted to meet criteria for sepsis with fever, tachypnea, tachycardia and leukocytosis.  Bilateral lower lobe pneumonias and significant hypoxia with 75% O2 sats on room air.  COVID-19 positive on 05/15/2019. Inflammatory markers are quite elevated with CRP of 18.5 although procalcitonin is also elevated at 1.12.  Admitted for acute COVID-19 viral pneumonia superimposed by community-acquired pneumonia.  Started on remdesivir, Decadron and IV antibiotics empirically.  05/20/19: Seen and examined.  Alert confused in a state of suspected dementia.  Hypoxic on 6 L with O2 saturation in the high 80s.  CTA PE negative on 05/19/2019.     Assessment/Plan: Principal Problem:   Sepsis ([place]) Active Problems:   CLL (chronic lymphocytic leukemia) (HCC)   BPH (benign prostatic hyperplasia)   CAD S/P PCI-- RCA DES 2014,  CFX DES Jan 2017   PTSD (post-traumatic stress disorder)   Benign essential HTN   Ischemic cardiomyopathy   GERD (gastroesophageal reflux disease)   Esophageal dysphagia   Community acquired pneumonia   Pneumonia due to COVID-19 virus   COVID-19  Resolving sepsis 2/2 Acute COVID-19 viral pneumonia superimposed by community-acquired pneumonia Presented with significant hypoxia, fever and leukocytosis  COVID-19 positive on 05/15/2019.   Procalcitonin greater than 1.1 Sepsis criteria is resolving Inflammatory markers are trending down but D-dimer  is trending up. Continue to trend inflammatory markers. Independently viewed chest x-ray done on 05/18/2020 showed worsening pulmonary infiltrates on the left lung. Completed 5 days of remdesivir Currently on Decadron Currently on 6 L with O2 saturation in the upper 80s Continue to wean off oxygen supplementation Continue bronchodilators Continue inhalers 3 times daily and incentive spirometer Continue vitamin C, D3 and zinc  Acute hypoxic respiratory failure secondary to acute COVID-19 viral pneumonia Not on oxygen supplementation at baseline Management as stated above CTA PE negative on 05/19/2019.  AKI on CKD 3A improving, likely prerenal in the setting of diarrhea and poor oral intake Likely prerenal in the setting of dehydration Creatinine trending up Unclear if urine output is accurate Hold off Lasix and lisinopril Continue to closely monitor  Diarrhea likely related to acute COVID-19 infection Treat symptomatically, improved.  Mild non-anion gap metabolic acidosis Chemistry bicarb 19 Anion gap 9 Sodium bicarb 1300 mg twice daily x2 days. Continue to monitor.  Resolved post repletion: Hypokalemia Potassium 4.7 on 05/19/2019.  Resolved hypotension Blood pressure is at goal. Maintain MAP greater than 65.  Chronic systolic CHF  euvolemic on exam Last 2D echo done on 2017 showed LVEF 40 to 45% Continue strict I's and O's and daily weight Continue to hold off Lasix and lisinopril for now due to soft blood pressures and recent AKI  LUTS Flomax on hold to avoid hypotension Resume Pyridium if becomes symptomatic  GERD Continue Protonix  Coronary artery disease Continue aspirin, Plavix and Zocor Denies any anginal symptoms at the time of this visit.  BPH Resume tamsulosin when appropriate Holding to  avoid hypotension  CLL  Immunocompromised Closely monitor Will need to follow-up with oncology at discharge  Physical debility/ambulatory dysfunction PT  assessment recommended SNF CSW consulted to assist with placement. Spoke with his wife on 05/18/2019 via phone she would prefer SNF in [place] due to closeness to home. Continue PT OT with assistance and fall precautions.  DVT prophylaxis: Lovenox subcu daily Code Status: Full code. Family Communication:  None at bedside.   Disposition Plan:  Possible discharge to SNF when bed is available and oxygen supplementation or requirement is less than 6 L nasal cannula.  Consults called: None   Objective: Vitals:   05/20/19 0011 05/20/19 0522 05/20/19 0800 05/20/19 1229  BP: (!) 132/59 (!) 149/75 131/64 (!) 137/56  Pulse: (!) 43 (!) 45 65 63  Resp: 18 17 (!) 22 15  Temp: 97.8 F (36.6 C) 97.8 F (36.6 C) 97.9 F (36.6 C) 97.8 F (36.6 C)  TempSrc: Oral Oral Axillary Axillary  SpO2: 98% 95% (!) 88% 94%  Weight:  67.2 kg    Height:        Intake/Output Summary (Last 24 hours) at 05/20/2019 1442 Last data filed at 05/20/2019 1300 Gross per 24 hour  Intake 480 ml  Output 350 ml  Net 130 ml   Filed Weights   05/17/19 0400 05/18/19 0445 05/20/19 0522  Weight: 67.6 kg 68.6 kg 67.2 kg    Exam:  . General: 73 y.o. year-old male well-developed well-nourished no acute distress.  Alert and interactive.  Cardiovascular: Regular rate and rhythm no rubs or gallops.  No JVD or thyromegaly noted. Marland Kitchen Respiratory: Mild rales at bases.  No wheezing noted.  Poor inspiratory effort.   . Abdomen: Soft nontender nondistended with normal bowel sounds present.   Musculoskeletal: Trace lower extremity edema bilaterally.   Psychiatry: Mood is appropriate for condition and setting.  Data Reviewed: CBC: Recent Labs  Lab 05/15/19 2332 05/17/19 0429 05/18/19 0417 05/19/19 0352 05/20/19 0500  WBC 36.1* 45.7* 45.2* 43.8* 42.6*  NEUTROABS 4.9 8.4* 8.0* 6.5 5.5  HGB 12.3* 12.1* 12.2* 11.7* 12.9*  HCT 40.0 38.5* 39.0 36.7* 40.7  MCV 98.0 95.1 94.0 94.1 92.9  PLT 141* 170 186 205 [id number]   Basic  Metabolic Panel: Recent Labs  Lab 05/15/19 2332 05/17/19 0429 05/18/19 0417 05/19/19 0352 05/20/19 0500  NA 136 137 139 139 139  K 3.8 4.4 4.6 4.7 4.9  CL 104 109 110 110 109  CO2 20* 19* 20* 20* 19*  GLUCOSE 176* 176* 170* 171* 171*  BUN 29* 34* 32* 32* 38*  CREATININE 1.53* 1.41* 1.23 1.19 1.52*  CALCIUM 7.7* 8.1* 8.1* 8.0* 8.3*  MG 2.0 2.0 2.0 2.1 2.2  PHOS 3.4 3.4 3.4 3.3 4.3   GFR: Estimated Creatinine Clearance: 39.3 mL/min (A) (by C-G formula based on SCr of 1.52 mg/dL (H)). Liver Function Tests: Recent Labs  Lab 05/15/19 2332 05/17/19 0429 05/18/19 0417 05/19/19 0352 05/20/19 0500  AST 34 37 37 40 34  ALT 14 16 17 20 20   ALKPHOS 52 46 47 50 53  BILITOT 0.7 0.8 0.8 0.9 0.8  PROT 6.0* 5.3* 5.2* 4.9* 5.3*  ALBUMIN 2.6* 2.3* 2.3* 2.1* 2.3*   No results for input(s): LIPASE, AMYLASE in the last 168 hours. No results for input(s): AMMONIA in the last 168 hours. Coagulation Profile: Recent Labs  Lab 05/15/19 1359  INR 1.2   Cardiac Enzymes: No results for input(s): CKTOTAL, CKMB, CKMBINDEX, TROPONINI in the last 168 hours. BNP (last 3  results) No results for input(s): PROBNP in the last 8760 hours. HbA1C: No results for input(s): HGBA1C in the last 72 hours. CBG: Recent Labs  Lab 05/19/19 2109  GLUCAP 128*   Lipid Profile: No results for input(s): CHOL, HDL, LDLCALC, TRIG, CHOLHDL, LDLDIRECT in the last 72 hours. Thyroid Function Tests: No results for input(s): TSH, T4TOTAL, FREET4, T3FREE, THYROIDAB in the last 72 hours. Anemia Panel: Recent Labs    05/19/19 0352 05/20/19 0500  FERRITIN 711* 855*   Urine analysis:    Component Value Date/Time   COLORURINE AMBER (A) 05/15/2019 1352   APPEARANCEUR HAZY (A) 05/15/2019 1352   LABSPEC 1.019 05/15/2019 1352   PHURINE 5.0 05/15/2019 1352   GLUCOSEU NEGATIVE 05/15/2019 1352   HGBUR LARGE (A) 05/15/2019 1352   BILIRUBINUR NEGATIVE 05/15/2019 1352   BILIRUBINUR negative 05/10/2019 1428   KETONESUR  NEGATIVE 05/15/2019 1352   PROTEINUR 100 (A) 05/15/2019 1352   UROBILINOGEN 0.2 05/10/2019 1428   NITRITE POSITIVE (A) 05/15/2019 1352   LEUKOCYTESUR NEGATIVE 05/15/2019 1352   Sepsis Labs: @LABRCNTIP (procalcitonin:4,lacticidven:4)  ) Recent Results (from the past 240 hour(s))  Urine Culture     Status: None   Collection Time: 05/10/19  2:43 PM   Specimen: Urine, Random  Result Value Ref Range Status   Specimen Description   Final    URINE, RANDOM Performed at [facility], [place] [address] [place], [place] [address]    Special Requests   Final    NONE Performed at [facility], [address] [place], [place] [address]    Culture   Final    NO GROWTH Performed at [facility], [place] [address] [place], [place] [address]    Report Status 05/12/2019 FINAL  Final  Urine culture     Status: None   Collection Time: 05/15/19  1:52 PM   Specimen: In/Out Cath Urine  Result Value Ref Range Status   Specimen Description   Final    IN/OUT CATH URINE Performed at [facility], [address] [place], [place] [address]    Special Requests   Final    NONE Performed at [facility], [address], [place], [place] [address]    Culture   Final    NO GROWTH Performed at [facility], [place] [address] [place], [place] [address]    Report Status 05/16/2019 FINAL  Final  Blood Culture (routine x 2)     Status: None   Collection Time: 05/15/19  1:57 PM   Specimen: BLOOD RIGHT HAND  Result Value Ref Range Status   Specimen Description BLOOD RIGHT HAND  Final   Special Requests   Final    BOTTLES DRAWN AEROBIC ONLY Blood Culture adequate volume   Culture   Final    NO GROWTH 5 DAYS Performed at [facility], [address], [place], [place] [address]    Report Status 05/20/2019 FINAL  Final  Blood Culture (routine x 2)     Status: None   Collection Time: 05/15/19  2:03 PM   Specimen: Right Antecubital; Blood  Result Value Ref Range Status   Specimen Description  RIGHT ANTECUBITAL  Final   Special Requests   Final    BOTTLES DRAWN AEROBIC AND ANAEROBIC Blood Culture adequate volume   Culture   Final    NO GROWTH 5 DAYS Performed at [facility], [address] [place], [place] [address]    Report Status 05/20/2019 FINAL  Final  Studies: No results found.  Scheduled Meds: . albuterol  2 puff Inhalation Q8H  . aspirin EC  81 mg Oral Daily  . cholecalciferol  1,000 Units Oral Daily  . clopidogrel  75 mg Oral Daily  . dexamethasone  6 mg Oral Q24H  . enoxaparin (LOVENOX) injection  40 mg Subcutaneous QHS  . furosemide  20 mg Intravenous Once  . furosemide  20 mg Oral Daily  . ipratropium  2 puff Inhalation Q8H  . pantoprazole  40 mg Oral BID AC  . simvastatin  40 mg Oral Daily  . tamsulosin  0.4 mg Oral QPC breakfast  . vitamin C  500 mg Oral Daily  . zinc sulfate  220 mg Oral Daily    Continuous Infusions:    LOS: 5 days     Kayleen Memos, MD [facility] Pager [phone]  If 7PM-7AM, please contact night-coverage [email or web address] Password [email or web address] 05/20/2019, 2:42 PM

## 2019-05-20 NOTE — TOC Progression Note (Signed)
Transition of Care [facility]) - Progression Note    Patient Details  Name: [name] MRN: [id number] Date of Birth: April 10, 1946  Transition [facility]) CM/SW [place], LCSW Phone Number: 05/20/2019, 4:27 PM  Clinical Narrative:    CSW provided bed offers to patient's spouse. She is accepting of [facility] since [place] facilities are unable to accommodate COVID positive patients. [facility] aware and will contact insurance company. Authorization waiver is in effect until January, so patient should be able to discharge once medically stable.    Expected Discharge Plan: [place]    Expected Discharge Plan and Services Expected Discharge Plan: [place] arrangements for the past 2 months: Single Family Home                                       Social Determinants of Health (SDOH) Interventions    Readmission Risk Interventions Readmission Risk Prevention Plan 05/17/2019  Transportation Screening Complete  HRI or [place] (No Data)  Some recent data might be hidden

## 2019-05-20 NOTE — Progress Notes (Signed)
Approached patient to place IV access. He stated he didn't feel that he needed it. Margarita Grizzle, RN came and spoke with patient; advised he needed IV to keep him safe. Pt wanted to call his wife first. Assisted patient in calling his wife. He spoke with her and after 5 mins finally agreed to let me place IV access. Ultrasound used to obtain access.

## 2019-05-20 NOTE — Progress Notes (Signed)
VAST consulted to obtain IV access. Pt currently has no IV meds/fluids or studies ordered. Spoke with Irene Pap, MD via [place] after nurse stated physician wanted access just in case pt deteriorates; educated it is best practice not to place an IV that is not currently needed to decrease infection risk and allow for vein preservation. Further educated if pt's condition changes and IV access is needed emergently, IV team or RRT would respond and place access at that time. Dr. [place] Crane responded that she wants an IV placed since patient is high risk and a full code.

## 2019-05-20 NOTE — Progress Notes (Signed)
Physical Therapy Treatment Patient Details Name: [name] MRN: [id number] DOB: 06/17/1945 Today's Date: 05/20/2019    History of Present Illness 73 y.o. male with past medical history significant for CLL, CAD, BPH and HFrEF who was apparently not feeling very well for the past 4 to 5 days.   Patient was noted to have bilateral lower lobe pneumonias and significant hypoxia with 75% O2 sats on room air.  Patient was given nasal cannula 4 L oxygen with improvement to O2 sats in the 90s with decreased work of breathing.   Covid test subsequently came back positive. admitted for Covid versus community-acquired pneumonia.     PT Comments    Pt on 9L on arrival with SpO2 93%. Pt with  Drop to 76% with stand and pivot to chair with HFNC increased to 12L pt able to maintain 86% with activity with time for recover to 90% between each standing trial or exercise with cues throughout for pursed lip breathing. Pt requires grossly 1-2 min between each activity to recover SpO2 and reports mild SOB throughout session. HR 73-79 Pt on 10 L end of session with sats 90% and RN notified of HFNC out of humidification   Follow Up Recommendations  SNF     Equipment Recommendations  Rolling walker with 5" wheels    Recommendations for Other Services       Precautions / Restrictions Precautions Precautions: Fall Precaution Comments: monitor sats    Mobility  Bed Mobility Overal bed mobility: Needs Assistance Bed Mobility: Supine to Sit     Supine to sit: Min guard     General bed mobility comments: minguard with use of rail with HOB 35 degrees, increased time  Transfers Overall transfer level: Needs assistance   Transfers: Sit to/from Stand;Stand Pivot Transfers Sit to Stand: Min assist Stand pivot transfers: Min assist       General transfer comment: min assist to stand from bed and from recliner x 4 trials with cues for hand placement, pivot with HHA. Pt able to perform 3 standing  trials of limited marching x 10 steps with limited foot clearance and seated rest to recover sats.SpO2 dropping to 85%on 15L with activity with sitting recovers to 90% and pt tolerating with cues for pursed lip breathing and to not hold his breath  Ambulation/Gait                 Stairs             Wheelchair Mobility    Modified Rankin (Stroke Patients Only)       Balance Overall balance assessment: Needs assistance Sitting-balance support: Feet supported Sitting balance-Leahy Scale: Good Sitting balance - Comments: good sitting balnce EOB     Standing balance-Leahy Scale: Poor Standing balance comment: single UE support in standing                            Cognition Arousal/Alertness: Awake/alert Behavior During Therapy: WFL for tasks assessed/performed Overall Cognitive Status: Impaired/Different from baseline Area of Impairment: Orientation;Memory;Following commands;Safety/judgement;Problem solving                 Orientation Level: Disoriented to;Place;Time;Situation   Memory: Decreased short-term memory Following Commands: Follows one step commands with increased time Safety/Judgement: Decreased awareness of safety;Decreased awareness of deficits   Problem Solving: Slow processing;Decreased initiation;Difficulty sequencing;Requires verbal cues;Requires tactile cues General Comments: pt telling me his wife's number is on the paper. Pt not oriented  other than self but following all commands.      Exercises General Exercises - Lower Extremity Long Arc Quad: AROM;Both;Seated;15 reps Hip ABduction/ADduction: AROM;Both;Seated;15 reps Hip Flexion/Marching: AROM;Seated;Both;15 reps    General Comments        Pertinent Vitals/Pain Pain Assessment: No/denies pain    Home Living                      Prior Function            PT Goals (current goals can now be found in the care plan section) Progress towards PT goals:  Progressing toward goals    Frequency    Min 2X/week      PT Plan Current plan remains appropriate    Co-evaluation              AM-PAC PT "6 Clicks" Mobility   Outcome Measure  Help needed turning from your back to your side while in a flat bed without using bedrails?: None Help needed moving from lying on your back to sitting on the side of a flat bed without using bedrails?: A Little Help needed moving to and from a bed to a chair (including a wheelchair)?: A Little Help needed standing up from a chair using your arms (e.g., wheelchair or bedside chair)?: A Little Help needed to walk in hospital room?: A Lot Help needed climbing 3-5 steps with a railing? : Total 6 Click Score: 16    End of Session Equipment Utilized During Treatment: Gait belt Activity Tolerance: Patient tolerated treatment well Patient left: in chair;with call bell/phone within reach;with chair alarm set Nurse Communication: Mobility status PT Visit Diagnosis: Unsteadiness on feet (R26.81);Other abnormalities of gait and mobility (R26.89);Muscle weakness (generalized) (M62.81);Difficulty in walking, not elsewhere classified (R26.2)     Time: [id number] PT Time Calculation (min) (ACUTE ONLY): 32 min  Charges:  $Therapeutic Exercise: 8-22 mins $Therapeutic Activity: 8-22 mins                     [name], PT Acute Rehabilitation Services Pager: [phone] Office: [phone]    [name] [name] 05/20/2019, 12:41 PM

## 2019-05-21 LAB — MRSA PCR SCREENING: MRSA by PCR: NEGATIVE

## 2019-05-21 MED ORDER — METHYLPREDNISOLONE SODIUM SUCC 125 MG IJ SOLR
60.0000 mg | Freq: Two times a day (BID) | INTRAMUSCULAR | Status: DC
  Administered 2019-05-21 – 2019-05-22 (×3): 60 mg via INTRAVENOUS
  Filled 2019-05-21 (×3): qty 2

## 2019-05-21 NOTE — Progress Notes (Signed)
PROGRESS NOTE  [name] [id number] DOB: [birth year] DOA: 05/15/2019 PCP: [name], MD  HPI/Recap of past 24 hours: [name] is an 73 y.o. male with past medical history significant for CLL, CAD, BPH and HFrEF 40 to 45% (2017) who was apparently not feeling well for the past 4 to 5 days.  Poor historian.  Denies chest pain or palpitations.  Of note patient was seen last week for dysuria and UA was negative at that time.  He was treated with Pyridium and Flomax with instructions to follow-up with urology.  ED Course: Bilateral lower lobe pneumonias and significant hypoxia with 75% O2 sats on room air.  COVID-19 positive on 05/15/2019. Inflammatory markers are quite elevated with CRP of 18.5 although procalcitonin is also elevated at 1.12.  Admitted for acute COVID-19 viral pneumonia superimposed by community-acquired pneumonia.  Started on remdesivir, Decadron and IV antibiotics empirically.  CTA PE negative on 05/19/2019.  05/21/19: Seen and examined.  He denies chest pain or dyspnea at rest.  Admits to intermittent cough.  Still requiring high level of oxygen on 6 L high flow nasal cannula saturating 88% in the room.     Assessment/Plan: Principal Problem:   Sepsis ([place]) Active Problems:   CLL (chronic lymphocytic leukemia) (HCC)   BPH (benign prostatic hyperplasia)   CAD S/P PCI-- RCA DES 2014,  CFX DES Jan 2017   PTSD (post-traumatic stress disorder)   Benign essential HTN   Ischemic cardiomyopathy   GERD (gastroesophageal reflux disease)   Esophageal dysphagia   Community acquired pneumonia   Pneumonia due to COVID-19 virus   COVID-19  Resolving sepsis 2/2 Acute COVID-19 viral pneumonia superimposed by community-acquired pneumonia Presented with significant hypoxia, fever and leukocytosis  COVID-19 positive on 05/15/2019.   Procalcitonin greater than 1.1 Sepsis criteria is resolving Inflammatory markers are trending down but D-dimer is trending  up. Continue to trend inflammatory markers. Independently viewed chest x-ray done on 05/19/19 showed worsening pulmonary infiltrates on the left lung. Completed 5 days of remdesivir Currently on Decadron 6 mg daily (12/9>>12/15), switch to Solu-Medrol 60 mg twice daily 12/15 Currently on 6 L with O2 saturation in the upper 80s Continue to wean off oxygen supplementation as tolerated Continue bronchodilators albuterol inhaler 2 puffs and ipratropium 2 puffs 3 times daily Continue flutter valve every 4 hours while awake Continue vitamin C, D3 and zinc  Acute hypoxic respiratory failure secondary to acute COVID-19 viral pneumonia Not on oxygen supplementation at baseline Management as stated above CTA PE negative on 05/19/2019.  AKI on CKD 3A improving, likely prerenal in the setting of diarrhea and poor oral intake Likely prerenal in the setting of dehydration Creatinine trending up Unclear if urine output is accurate Hold off Lasix and lisinopril due to AKI Continue to closely monitor urine output, electrolytes, and renal function  Diarrhea likely related to acute COVID-19 infection Treat symptomatically, improved.  Mild non-anion gap metabolic acidosis Chemistry bicarb 19 Anion gap 9 Sodium bicarb 1300 mg twice daily x2 days.  Resolved post repletion: Hypokalemia  Resolved hypotension Blood pressure is at goal. Maintain MAP greater than 65.  Chronic systolic CHF  euvolemic on exam Last 2D echo done on 2017 showed LVEF 40 to 45% Continue strict I's and O's and daily weight Continue to hold off Lasix and lisinopril for now due to soft blood pressures and recent AKI  LUTS Flomax on hold to avoid hypotension Resume Pyridium if becomes symptomatic  GERD Continue Protonix  Coronary  artery disease Continue aspirin, Plavix and Zocor Denies any anginal symptoms at the time of this visit.  BPH Resume tamsulosin when appropriate Holding to avoid hypotension  CLL   Immunocompromised Closely monitor, outpatient follow up  Physical debility/ambulatory dysfunction PT assessment recommended SNF CSW consulted to assist with placement. Spoke with his wife on 05/20/2019 via phone she would prefer SNF in [place] [employer] due to closeness to home. Continue PT OT with assistance and fall precautions.  DVT prophylaxis: Lovenox subcu daily Code Status: Full code. Family Communication:  Wife via phone   Disposition Plan:  Possible discharge to SNF when bed is available and oxygen supplementation or requirement is less than 6 L nasal cannula.  Consults called: None   Objective: Vitals:   05/21/19 0421 05/21/19 0800 05/21/19 1254 05/21/19 1600  BP: (!) 118/57  132/66 136/68  Pulse: 84 80 72 65  Resp: (!) 22  (!) 26 (!) 21  Temp: 98 F (36.7 C)  97.9 F (36.6 C) 98.1 F (36.7 C)  TempSrc:   Axillary Axillary  SpO2: 92% (!) 88% (!) 88% 90%  Weight: 68 kg     Height:        Intake/Output Summary (Last 24 hours) at 05/21/2019 1801 Last data filed at 05/21/2019 1300 Gross per 24 hour  Intake 370 ml  Output 1000 ml  Net -630 ml   Filed Weights   05/18/19 0445 05/20/19 0522 05/21/19 0421  Weight: 68.6 kg 67.2 kg 68 kg    Exam:  . General: 73 y.o. year-old male well-developed well-nourished no acute distress.  Alert and confused with suspected dementia.  .  Cardiovascular: Regular rate and rhythm no rubs or gallops.  No JVD or thyromegaly noted. Marland Kitchen Respiratory: Fine rales at bases.  No wheezing noted.  Poor inspiratory effort. . Abdomen: Soft nontender monitor normal bowel sounds present. Musculoskeletal: Trace lower extremity edema bilaterally. Psychiatry: Mood is appropriate for condition and setting.   Data Reviewed: CBC: Recent Labs  Lab 05/15/19 2332 05/17/19 0429 05/18/19 0417 05/19/19 0352 05/20/19 0500  WBC 36.1* 45.7* 45.2* 43.8* 42.6*  NEUTROABS 4.9 8.4* 8.0* 6.5 5.5  HGB 12.3* 12.1* 12.2* 11.7* 12.9*  HCT 40.0 38.5* 39.0  36.7* 40.7  MCV 98.0 95.1 94.0 94.1 92.9  PLT 141* 170 186 205 [id number]   Basic Metabolic Panel: Recent Labs  Lab 05/15/19 2332 05/17/19 0429 05/18/19 0417 05/19/19 0352 05/20/19 0500  NA 136 137 139 139 139  K 3.8 4.4 4.6 4.7 4.9  CL 104 109 110 110 109  CO2 20* 19* 20* 20* 19*  GLUCOSE 176* 176* 170* 171* 171*  BUN 29* 34* 32* 32* 38*  CREATININE 1.53* 1.41* 1.23 1.19 1.52*  CALCIUM 7.7* 8.1* 8.1* 8.0* 8.3*  MG 2.0 2.0 2.0 2.1 2.2  PHOS 3.4 3.4 3.4 3.3 4.3   GFR: Estimated Creatinine Clearance: 39.3 mL/min (A) (by C-G formula based on SCr of 1.52 mg/dL (H)). Liver Function Tests: Recent Labs  Lab 05/15/19 2332 05/17/19 0429 05/18/19 0417 05/19/19 0352 05/20/19 0500  AST 34 37 37 40 34  ALT 14 16 17 20 20   ALKPHOS 52 46 47 50 53  BILITOT 0.7 0.8 0.8 0.9 0.8  PROT 6.0* 5.3* 5.2* 4.9* 5.3*  ALBUMIN 2.6* 2.3* 2.3* 2.1* 2.3*   No results for input(s): LIPASE, AMYLASE in the last 168 hours. No results for input(s): AMMONIA in the last 168 hours. Coagulation Profile: Recent Labs  Lab 05/15/19 1359  INR 1.2   Cardiac Enzymes: No  results for input(s): CKTOTAL, CKMB, CKMBINDEX, TROPONINI in the last 168 hours. BNP (last 3 results) No results for input(s): PROBNP in the last 8760 hours. HbA1C: No results for input(s): HGBA1C in the last 72 hours. CBG: Recent Labs  Lab 05/19/19 2109  GLUCAP 128*   Lipid Profile: No results for input(s): CHOL, HDL, LDLCALC, TRIG, CHOLHDL, LDLDIRECT in the last 72 hours. Thyroid Function Tests: No results for input(s): TSH, T4TOTAL, FREET4, T3FREE, THYROIDAB in the last 72 hours. Anemia Panel: Recent Labs    05/19/19 0352 05/20/19 0500  FERRITIN 711* 855*   Urine analysis:    Component Value Date/Time   COLORURINE AMBER (A) 05/15/2019 1352   APPEARANCEUR HAZY (A) 05/15/2019 1352   LABSPEC 1.019 05/15/2019 1352   PHURINE 5.0 05/15/2019 1352   GLUCOSEU NEGATIVE 05/15/2019 1352   HGBUR LARGE (A) 05/15/2019 1352    BILIRUBINUR NEGATIVE 05/15/2019 1352   BILIRUBINUR negative 05/10/2019 1428   KETONESUR NEGATIVE 05/15/2019 1352   PROTEINUR 100 (A) 05/15/2019 1352   UROBILINOGEN 0.2 05/10/2019 1428   NITRITE POSITIVE (A) 05/15/2019 1352   LEUKOCYTESUR NEGATIVE 05/15/2019 1352   Sepsis Labs: @LABRCNTIP (procalcitonin:4,lacticidven:4)  ) Recent Results (from the past 240 hour(s))  Urine culture     Status: None   Collection Time: 05/15/19  1:52 PM   Specimen: In/Out Cath Urine  Result Value Ref Range Status   Specimen Description   Final    IN/OUT CATH URINE Performed at [facility], [address] [place], [place] [address]    Special Requests   Final    NONE Performed at [facility], [address], [place], [place] [address]    Culture   Final    NO GROWTH Performed at [facility], [place] [address], [place], [place] [address]    Report Status 05/16/2019 FINAL  Final  Blood Culture (routine x 2)     Status: None   Collection Time: 05/15/19  1:57 PM   Specimen: BLOOD RIGHT HAND  Result Value Ref Range Status   Specimen Description BLOOD RIGHT HAND  Final   Special Requests   Final    BOTTLES DRAWN AEROBIC ONLY Blood Culture adequate volume   Culture   Final    NO GROWTH 5 DAYS Performed at [facility], [address], [place], [place] [address]    Report Status 05/20/2019 FINAL  Final  Blood Culture (routine x 2)     Status: None   Collection Time: 05/15/19  2:03 PM   Specimen: Right Antecubital; Blood  Result Value Ref Range Status   Specimen Description RIGHT ANTECUBITAL  Final   Special Requests   Final    BOTTLES DRAWN AEROBIC AND ANAEROBIC Blood Culture adequate volume   Culture   Final    NO GROWTH 5 DAYS Performed at [facility], [address] [place], [place] [address]    Report Status 05/20/2019 FINAL  Final  MRSA PCR Screening     Status: None   Collection Time: 05/21/19  5:25 AM   Specimen: Nasal Mucosa; Nasopharyngeal  Result Value Ref Range Status   MRSA  by PCR NEGATIVE NEGATIVE Final    Comment:        The GeneXpert MRSA Assay (FDA approved for NASAL specimens only), is one component of a comprehensive MRSA colonization surveillance program. It is not intended to diagnose MRSA infection nor to guide or monitor treatment for MRSA infections. Performed at [facility], [place] [address], [place], [place] [address]  Studies: No results found.  Scheduled Meds: . albuterol  2 puff Inhalation Q8H  . aspirin EC  81 mg Oral Daily  . cholecalciferol  1,000 Units Oral Daily  . clopidogrel  75 mg Oral Daily  . dexamethasone  6 mg Oral Q24H  . enoxaparin (LOVENOX) injection  40 mg Subcutaneous QHS  . furosemide  20 mg Intravenous Once  . furosemide  20 mg Oral Daily  . ipratropium  2 puff Inhalation Q8H  . mouth rinse  15 mL Mouth Rinse BID  . pantoprazole  40 mg Oral BID AC  . simvastatin  40 mg Oral Daily  . sodium bicarbonate  1,300 mg Oral BID  . tamsulosin  0.4 mg Oral QPC breakfast  . vitamin C  500 mg Oral Daily  . zinc sulfate  220 mg Oral Daily    Continuous Infusions:    LOS: 6 days     Kayleen Memos, MD [facility] Pager [phone]  If 7PM-7AM, please contact night-coverage [email or web address] Password [email or web address] 05/21/2019, 6:01 PM

## 2019-05-22 ENCOUNTER — Ambulatory Visit (HOSPITAL_COMMUNITY): Payer: Medicare HMO | Admitting: Hematology

## 2019-05-22 ENCOUNTER — Encounter (HOSPITAL_COMMUNITY): Payer: Self-pay | Admitting: Internal Medicine

## 2019-05-22 DIAGNOSIS — J9601 Acute respiratory failure with hypoxia: Secondary | ICD-10-CM | POA: Diagnosis present

## 2019-05-22 DIAGNOSIS — I10 Essential (primary) hypertension: Secondary | ICD-10-CM

## 2019-05-22 HISTORY — DX: Acute respiratory failure with hypoxia: J96.01

## 2019-05-22 LAB — COMPREHENSIVE METABOLIC PANEL
ALT: 15 U/L (ref 0–44)
AST: 23 U/L (ref 15–41)
Albumin: 2.1 g/dL — ABNORMAL LOW (ref 3.5–5.0)
Alkaline Phosphatase: 52 U/L (ref 38–126)
Anion gap: 10 (ref 5–15)
BUN: 36 mg/dL — ABNORMAL HIGH (ref 8–23)
CO2: 22 mmol/L (ref 22–32)
Calcium: 8 mg/dL — ABNORMAL LOW (ref 8.9–10.3)
Chloride: 105 mmol/L (ref 98–111)
Creatinine, Ser: 1.31 mg/dL — ABNORMAL HIGH (ref 0.61–1.24)
GFR calc Af Amer: >60 mL/min (ref >60)
GFR calc non Af Amer: 53 mL/min — ABNORMAL LOW (ref >60)
Glucose, Bld: 218 mg/dL — ABNORMAL HIGH (ref 70–99)
Potassium: 5.1 mmol/L (ref 3.5–5.1)
Sodium: 137 mmol/L (ref 135–145)
Total Bilirubin: 1 mg/dL (ref 0.3–1.2)
Total Protein: 4.9 g/dL — ABNORMAL LOW (ref 6.5–8.1)

## 2019-05-22 LAB — CBC WITH DIFFERENTIAL/PLATELET
Abs Immature Granulocytes: 0.08 10*3/uL — ABNORMAL HIGH (ref 0.00–0.07)
Basophils Absolute: 0 10*3/uL (ref 0.0–0.1)
Basophils Relative: 0 %
Eosinophils Absolute: 0 10*3/uL (ref 0.0–0.5)
Eosinophils Relative: 0 %
HCT: 35.3 % — ABNORMAL LOW (ref 39.0–52.0)
Hemoglobin: 11.6 g/dL — ABNORMAL LOW (ref 13.0–17.0)
Immature Granulocytes: 0 %
Lymphocytes Relative: 88 %
Lymphs Abs: 39.2 10*3/uL — ABNORMAL HIGH (ref 0.7–4.0)
MCH: 29.7 pg (ref 26.0–34.0)
MCHC: 32.9 g/dL (ref 30.0–36.0)
MCV: 90.5 fL (ref 80.0–100.0)
Monocytes Absolute: 0.1 10*3/uL (ref 0.1–1.0)
Monocytes Relative: 0 %
Neutro Abs: 5.3 10*3/uL (ref 1.7–7.7)
Neutrophils Relative %: 12 %
Platelets: 284 10*3/uL (ref 150–400)
RBC: 3.9 MIL/uL — ABNORMAL LOW (ref 4.22–5.81)
RDW: 13.9 % (ref 11.5–15.5)
WBC: 44.8 10*3/uL — ABNORMAL HIGH (ref 4.0–10.5)
nRBC: 0 % (ref 0.0–0.2)

## 2019-05-22 LAB — FERRITIN: Ferritin: 666 ng/mL — ABNORMAL HIGH (ref 24–336)

## 2019-05-22 LAB — LACTATE DEHYDROGENASE: LDH: 254 U/L — ABNORMAL HIGH (ref 98–192)

## 2019-05-22 LAB — C-REACTIVE PROTEIN: CRP: 1.8 mg/dL — ABNORMAL HIGH (ref <1.0)

## 2019-05-22 LAB — D-DIMER, QUANTITATIVE: D-Dimer, Quant: 7.42 ug/mL-FEU — ABNORMAL HIGH (ref 0.00–0.50)

## 2019-05-22 LAB — FIBRINOGEN: Fibrinogen: 434 mg/dL (ref 210–475)

## 2019-05-22 MED ORDER — SODIUM BICARBONATE 650 MG PO TABS
325.0000 mg | ORAL_TABLET | Freq: Two times a day (BID) | ORAL | Status: DC
  Administered 2019-05-22 – 2019-05-29 (×12): 325 mg via ORAL
  Filled 2019-05-22 (×12): qty 1

## 2019-05-22 MED ORDER — FUROSEMIDE 20 MG PO TABS
20.0000 mg | ORAL_TABLET | Freq: Every day | ORAL | Status: DC
  Administered 2019-05-22: 20 mg via ORAL

## 2019-05-22 NOTE — Progress Notes (Signed)
Occupational Therapy Treatment Patient Details Name: [name] MRN: [id number] DOB: Nov 17, 1945 Today's Date: 05/22/2019    History of present illness 73 y.o. male with past medical history significant for CLL, CAD, BPH and HFrEF who was apparently not feeling very well for the past 4 to 5 days.   Patient was noted to have bilateral lower lobe pneumonias and significant hypoxia with 75% O2 sats on room air.  Patient was given nasal cannula 4 L oxygen with improvement to O2 sats in the 90s with decreased work of breathing.   Covid test subsequently came back positive. admitted for Covid versus community-acquired pneumonia.    OT comments  Pt making steady progress towards OT goals this session. Session focus on functional mobility and seated grooming at sink. Overall, pt limited by decreased activity tolerance and cardiopulmonary status. Pt on 8 L O2 HFNC throughout session with sats >80% throughout session. Pt required cues to incorporate PLB into session. Also, provided education on using IS throughout day. Pt complete functional mobility/ transfers with MIN A needing cues for safety with RW management as pt noted to leave RW behind during transition from sink>recliner. Opted to complete seated ADLs at sink d/t pt desaturating. Pt required supervision/ set- up for seated grooming task. Pt presents with slightly slow processing needing verbal/ visual cues as noted by pt needing VC to orient pt to toothpaste already being on the toothbrush. DC plan currently remains appropriate. Will continue to follow acutely per POC.    Follow Up Recommendations  SNF;Supervision/Assistance - 24 hour    Equipment Recommendations  Other (comment)(TBD)    Recommendations for Other Services      Precautions / Restrictions Precautions Precautions: Fall Precaution Comments: monitor sats Restrictions Weight Bearing Restrictions: No       Mobility Bed Mobility Overal bed mobility: Needs Assistance Bed  Mobility: Supine to Sit     Supine to sit: Min guard;+2 for safety/equipment     General bed mobility comments: min guard with use of rails and +2 for safety  Transfers Overall transfer level: Needs assistance Equipment used: Rolling walker (2 wheeled) Transfers: Sit to/from [facility] Sit to Stand: Min assist;+2 safety/equipment Stand pivot transfers: Min assist;+2 safety/equipment       General transfer comment: MINA for sit>stand from EOB with cues for hand placement, MIN A for SPT to recliner with cues for safety and hand placement on RW. Pt required cues to reach back when descending onto surfaces    Balance Overall balance assessment: Needs assistance Sitting-balance support: Feet supported Sitting balance-Leahy Scale: Good Sitting balance - Comments: good sitting balnce EOB   Standing balance support: Bilateral upper extremity supported;During functional activity Standing balance-Leahy Scale: Poor Standing balance comment: reliant on BUE support                           ADL either performed or assessed with clinical judgement   ADL Overall ADL's : Needs assistance/impaired     Grooming: Wash/dry face;Oral care;Brushing hair;Sitting;Supervision/safety;Set up Grooming Details (indicate cue type and reason): pt completed ADLs from seated d/t desaturation on HFNC on 8L O2 to lows 80s. Pt required set-up/ supervision assist                 Toilet Transfer: Minimal assistance;+2 for safety/equipment;Ambulation;RW Toilet Transfer Details (indicate cue type and reason): simulated to recliner via functional mobility with RW; MIN A for balance and RW management as pt noted to  attempt to leave RW behind during functional mobility         Functional mobility during ADLs: Minimal assistance;Rolling walker;Cueing for safety;Cueing for sequencing;+2 for safety/equipment General ADL Comments: session focus on functional mobility and seated  grooming tasks. pt limited by poor activity tolerance and cardiopulmonary status     Vision Patient Visual Report: No change from baseline     Perception     Praxis      Cognition Arousal/Alertness: Awake/alert Behavior During Therapy: WFL for tasks assessed/performed Overall Cognitive Status: Impaired/Different from baseline Area of Impairment: Memory;Following commands;Safety/judgement;Problem solving;Attention                   Current Attention Level: Sustained Memory: Decreased short-term memory Following Commands: Follows one step commands with increased time Safety/Judgement: Decreased awareness of safety;Decreased awareness of deficits   Problem Solving: Slow processing;Decreased initiation;Difficulty sequencing;Requires verbal cues;Requires tactile cues General Comments: pt perseverating on his wifes number being tucked into the tissue box, needs increased time to process but follows commands overall. Pt with decreased awareness  noted when pt unaware that toothpaste was already on the toothpaste. pt requires cues for safety noted when pt left RW at sink in transition from sink>recliner        Exercises     Shoulder Instructions       General Comments pt on 8L HFNC with sats >80% throughout session. Pt required increased rest breaks during session d/t decreased activity tolerance    Pertinent Vitals/ Pain       Pain Assessment: No/denies pain  Home Living                                          Prior Functioning/Environment              Frequency  Min 2X/week        Progress Toward Goals  OT Goals(current goals can now be found in the care plan section)  Progress towards OT goals: Progressing toward goals  Acute Rehab OT Goals Patient Stated Goal: to brush my teeth OT Goal Formulation: With patient Time For Goal Achievement: 06/01/19 Potential to Achieve Goals: Good  Plan Discharge plan remains appropriate     Co-evaluation                 AM-PAC OT "6 Clicks" Daily Activity     Outcome Measure   Help from another person eating meals?: A Little Help from another person taking care of personal grooming?: A Little Help from another person toileting, which includes using toliet, bedpan, or urinal?: A Lot Help from another person bathing (including washing, rinsing, drying)?: A Lot Help from another person to put on and taking off regular upper body clothing?: A Little Help from another person to put on and taking off regular lower body clothing?: A Lot 6 Click Score: 15    End of Session Equipment Utilized During Treatment: Gait belt;Rolling walker;Oxygen;Other (comment)(8LO2 HFNC)  OT Visit Diagnosis: Unsteadiness on feet (R26.81);Other abnormalities of gait and mobility (R26.89);History of falling (Z91.81);Muscle weakness (generalized) (M62.81);Other symptoms and signs involving cognitive function   Activity Tolerance Patient tolerated treatment well   Patient Left in chair;with call bell/phone within reach;with chair alarm set   Nurse Communication Mobility status        Time: [id number] OT Time Calculation (min): 39 min  Charges: OT General Charges $OT Visit:  1 Visit OT Treatments $Self Care/Home Management : 38-52 mins  Lanier Clam., COTA/L Acute Rehabilitation Services [phone] [place] 05/22/2019, 4:11 PM

## 2019-05-22 NOTE — Progress Notes (Addendum)
Progress Note    [name]  [id number] DOB: 1945-12-23  DOA: 05/15/2019 PCP: [name], MD      Brief Narrative:    Medical records reviewed and are as summarized below:  [name] is an 73 y.o. male with medical history significant for CLL, CAD, BPH, heart failure with reduced ejection fraction, who presented to the hospital with fatigue, shortness of breath and confusion.  He was found to have COVID-19 pneumonia and acute hypoxemic respiratory failure.  Oxygen saturation was 75% on room air on admission.      Assessment/Plan:   Principal Problem:   Pneumonia due to COVID-19 virus Active Problems:   CLL (chronic lymphocytic leukemia) (HCC)   BPH (benign prostatic hyperplasia)   CAD S/P PCI-- RCA DES 2014,  CFX DES Jan 2017   PTSD (post-traumatic stress disorder)   Benign essential HTN   Ischemic cardiomyopathy   GERD (gastroesophageal reflux disease)   Esophageal dysphagia   Sepsis ([place])   Community acquired pneumonia   COVID-19   Acute respiratory failure with hypoxia ([place])   Body mass index is 23.24 kg/m.   COVID-19 multifocal pneumonia completed IV remdesivir.  Continue IV steroids.  Acute hypoxemic respiratory failure: He is requiring 6 L of oxygen.  Taper off oxygen as able.  CTA was negative for pulmonary embolism on [id number]  Chronic systolic CHF: 2D echo in 0000000 showed EF of 40 to 45%.  Resume Lasix  CAD: Continue aspirin, Plavix and statin  CKD stage IIIa with metabolic acidosis: Creatinine is stable and bicarbonate is improving.  CLL and immunocompromise state: Patient has chronic leukocytosis/lymphocytosis  Debility: PT recommends discharge to SNF.   Family Communication/Anticipated D/C date and plan/Code Status   DVT prophylaxis: Lovenox Code Status: Full code Family Communication: None Disposition Plan: Patient will be discharged to SNF once medically stable      Subjective:   No complaints.  No reported  shortness of breath or chest pain.  Objective:    Vitals:   05/22/19 0800 05/22/19 0900 05/22/19 1328 05/22/19 1336  BP: 134/66  139/72   Pulse: 75  72   Resp: 19  20   Temp: 97.6 F (36.4 C)  98.1 F (36.7 C)   TempSrc: Oral  Oral   SpO2: (!) 86% 92%  95%  Weight:      Height:        Intake/Output Summary (Last 24 hours) at 05/22/2019 1436 Last data filed at 05/22/2019 1005 Gross per 24 hour  Intake 320 ml  Output 800 ml  Net -480 ml   Filed Weights   05/20/19 0522 05/21/19 0421 05/22/19 0445  Weight: 67.2 kg 68 kg 67.3 kg    Exam:  GEN: NAD SKIN: No rash noted EYES: No pallor or icterus ENT: MMM CV: RRR PULM: No wheezing, bibasilar rales ABD: soft, ND, NT, +BS CNS: Alert but confused, non focal EXT: No edema or tenderness         Data Reviewed:   I have personally reviewed following labs and imaging studies:  Labs: Labs show the following:   Basic Metabolic Panel: Recent Labs  Lab 05/15/19 2332 05/17/19 0429 05/18/19 0417 05/19/19 0352 05/20/19 0500 05/22/19 0358  NA 136 137 139 139 139 137  K 3.8 4.4 4.6 4.7 4.9 5.1  CL 104 109 110 110 109 105  CO2 20* 19* 20* 20* 19* 22  GLUCOSE 176* 176* 170* 171* 171* 218*  BUN 29* 34* 32*  32* 38* 36*  CREATININE 1.53* 1.41* 1.23 1.19 1.52* 1.31*  CALCIUM 7.7* 8.1* 8.1* 8.0* 8.3* 8.0*  MG 2.0 2.0 2.0 2.1 2.2  --   PHOS 3.4 3.4 3.4 3.3 4.3  --    GFR Estimated Creatinine Clearance: 45.6 mL/min (A) (by C-G formula based on SCr of 1.31 mg/dL (H)). Liver Function Tests: Recent Labs  Lab 05/17/19 0429 05/18/19 0417 05/19/19 0352 05/20/19 0500 05/22/19 0358  AST 37 37 40 34 23  ALT 16 17 20 20 15   ALKPHOS 46 47 50 53 52  BILITOT 0.8 0.8 0.9 0.8 1.0  PROT 5.3* 5.2* 4.9* 5.3* 4.9*  ALBUMIN 2.3* 2.3* 2.1* 2.3* 2.1*   No results for input(s): LIPASE, AMYLASE in the last 168 hours. No results for input(s): AMMONIA in the last 168 hours. Coagulation profile No results for input(s): INR,  PROTIME in the last 168 hours.  CBC: Recent Labs  Lab 05/17/19 0429 05/18/19 0417 05/19/19 0352 05/20/19 0500 05/22/19 0358  WBC 45.7* 45.2* 43.8* 42.6* 44.8*  NEUTROABS 8.4* 8.0* 6.5 5.5 5.3  HGB 12.1* 12.2* 11.7* 12.9* 11.6*  HCT 38.5* 39.0 36.7* 40.7 35.3*  MCV 95.1 94.0 94.1 92.9 90.5  PLT 170 186 205 232 284   Cardiac Enzymes: No results for input(s): CKTOTAL, CKMB, CKMBINDEX, TROPONINI in the last 168 hours. BNP (last 3 results) No results for input(s): PROBNP in the last 8760 hours. CBG: Recent Labs  Lab 05/19/19 2109  GLUCAP 128*   D-Dimer: Recent Labs    05/20/19 0500 05/22/19 0358  DDIMER 2.35* 7.42*   Hgb A1c: No results for input(s): HGBA1C in the last 72 hours. Lipid Profile: No results for input(s): CHOL, HDL, LDLCALC, TRIG, CHOLHDL, LDLDIRECT in the last 72 hours. Thyroid function studies: No results for input(s): TSH, T4TOTAL, T3FREE, THYROIDAB in the last 72 hours.  Invalid input(s): FREET3 Anemia work up: Recent Labs    05/20/19 0500 05/22/19 0358  FERRITIN 855* 666*   Sepsis Labs: Recent Labs  Lab 05/18/19 0417 05/19/19 0352 05/20/19 0500 05/22/19 0358  PROCALCITON  --  0.27  --   --   WBC 45.2* 43.8* 42.6* 44.8*    Microbiology Recent Results (from the past 240 hour(s))  Urine culture     Status: None   Collection Time: 05/15/19  1:52 PM   Specimen: In/Out Cath Urine  Result Value Ref Range Status   Specimen Description   Final    IN/OUT CATH URINE Performed at [facility], [address], [place], [place] [address]    Special Requests   Final    NONE Performed at [facility], [address], [place], [place] [address]    Culture   Final    NO GROWTH Performed at [facility], [place] [address], [place], [place] [address]    Report Status 05/16/2019 FINAL  Final  Blood Culture (routine x 2)     Status: None   Collection Time: 05/15/19  1:57 PM   Specimen: BLOOD RIGHT HAND  Result Value Ref Range Status    Specimen Description BLOOD RIGHT HAND  Final   Special Requests   Final    BOTTLES DRAWN AEROBIC ONLY Blood Culture adequate volume   Culture   Final    NO GROWTH 5 DAYS Performed at [facility], [address], [place], [place] [address]    Report Status 05/20/2019 FINAL  Final  Blood Culture (routine x 2)     Status: None   Collection Time:  05/15/19  2:03 PM   Specimen: Right Antecubital; Blood  Result Value Ref Range Status   Specimen Description RIGHT ANTECUBITAL  Final   Special Requests   Final    BOTTLES DRAWN AEROBIC AND ANAEROBIC Blood Culture adequate volume   Culture   Final    NO GROWTH 5 DAYS Performed at [facility], [address] [place], [place] [address]    Report Status 05/20/2019 FINAL  Final  MRSA PCR Screening     Status: None   Collection Time: 05/21/19  5:25 AM   Specimen: Nasal Mucosa; Nasopharyngeal  Result Value Ref Range Status   MRSA by PCR NEGATIVE NEGATIVE Final    Comment:        The GeneXpert MRSA Assay (FDA approved for NASAL specimens only), is one component of a comprehensive MRSA colonization surveillance program. It is not intended to diagnose MRSA infection nor to guide or monitor treatment for MRSA infections. Performed at [facility], [place] [address], [place], [place] [address]     Procedures and diagnostic studies:  No results found.  Medications:   . albuterol  2 puff Inhalation Q8H  . aspirin EC  81 mg Oral Daily  . cholecalciferol  1,000 Units Oral Daily  . clopidogrel  75 mg Oral Daily  . enoxaparin (LOVENOX) injection  40 mg Subcutaneous QHS  . furosemide  20 mg Oral Daily  . ipratropium  2 puff Inhalation Q8H  . mouth rinse  15 mL Mouth Rinse BID  . methylPREDNISolone (SOLU-MEDROL) injection  60 mg Intravenous Q12H  . pantoprazole  40 mg Oral BID AC  . simvastatin  40 mg Oral Daily  . sodium bicarbonate  325 mg Oral BID  . tamsulosin  0.4 mg Oral QPC breakfast  . vitamin C  500 mg Oral Daily  . zinc  sulfate  220 mg Oral Daily   Continuous Infusions:   LOS: 7 days   Emaly Boschert  [facility]   *Please refer to [email or web address], password [email or web address] to get updated schedule on who will round on this patient, as hospitalists switch teams weekly. If 7PM-7AM, please contact night-coverage at [email or web address], password [email or web address] for any overnight needs.  05/22/2019, 2:36 PM

## 2019-05-23 ENCOUNTER — Inpatient Hospital Stay (HOSPITAL_COMMUNITY): Payer: Medicare HMO

## 2019-05-23 DIAGNOSIS — K5791 Diverticulosis of intestine, part unspecified, without perforation or abscess with bleeding: Secondary | ICD-10-CM

## 2019-05-23 DIAGNOSIS — J9601 Acute respiratory failure with hypoxia: Secondary | ICD-10-CM

## 2019-05-23 DIAGNOSIS — J1289 Other viral pneumonia: Secondary | ICD-10-CM

## 2019-05-23 DIAGNOSIS — K921 Melena: Secondary | ICD-10-CM

## 2019-05-23 DIAGNOSIS — U071 COVID-19: Secondary | ICD-10-CM

## 2019-05-23 LAB — GLUCOSE, CAPILLARY
Glucose-Capillary: 181 mg/dL — ABNORMAL HIGH (ref 70–99)
Glucose-Capillary: 152 mg/dL — ABNORMAL HIGH (ref 70–99)
Glucose-Capillary: 156 mg/dL — ABNORMAL HIGH (ref 70–99)

## 2019-05-23 LAB — CBC
HCT: 31.9 % — ABNORMAL LOW (ref 39.0–52.0)
HCT: 28.7 % — ABNORMAL LOW (ref 39.0–52.0)
HCT: 30.8 % — ABNORMAL LOW (ref 39.0–52.0)
Hemoglobin: 10.3 g/dL — ABNORMAL LOW (ref 13.0–17.0)
Hemoglobin: 9 g/dL — ABNORMAL LOW (ref 13.0–17.0)
Hemoglobin: 10.4 g/dL — ABNORMAL LOW (ref 13.0–17.0)
MCH: 29.8 pg (ref 26.0–34.0)
MCH: 29.3 pg (ref 26.0–34.0)
MCH: 30.2 pg (ref 26.0–34.0)
MCHC: 32.3 g/dL (ref 30.0–36.0)
MCHC: 31.4 g/dL (ref 30.0–36.0)
MCHC: 33.8 g/dL (ref 30.0–36.0)
MCV: 92.2 fL (ref 80.0–100.0)
MCV: 93.5 fL (ref 80.0–100.0)
MCV: 89.5 fL (ref 80.0–100.0)
Platelets: 299 10*3/uL (ref 150–400)
Platelets: 246 10*3/uL (ref 150–400)
Platelets: 175 10*3/uL (ref 150–400)
RBC: 3.46 MIL/uL — ABNORMAL LOW (ref 4.22–5.81)
RBC: 3.07 MIL/uL — ABNORMAL LOW (ref 4.22–5.81)
RBC: 3.44 MIL/uL — ABNORMAL LOW (ref 4.22–5.81)
RDW: 14 % (ref 11.5–15.5)
RDW: 14.3 % (ref 11.5–15.5)
RDW: 14.6 % (ref 11.5–15.5)
WBC: 50.1 10*3/uL (ref 4.0–10.5)
WBC: 48.5 10*3/uL — ABNORMAL HIGH (ref 4.0–10.5)
WBC: 44.5 10*3/uL — ABNORMAL HIGH (ref 4.0–10.5)
nRBC: 0 % (ref 0.0–0.2)
nRBC: 0 % (ref 0.0–0.2)
nRBC: 0 % (ref 0.0–0.2)

## 2019-05-23 LAB — PROTIME-INR
INR: 1.4 — ABNORMAL HIGH (ref 0.8–1.2)
Prothrombin Time: 17.3 seconds — ABNORMAL HIGH (ref 11.4–15.2)

## 2019-05-23 LAB — COMPREHENSIVE METABOLIC PANEL
ALT: 14 U/L (ref 0–44)
AST: 22 U/L (ref 15–41)
Albumin: 2 g/dL — ABNORMAL LOW (ref 3.5–5.0)
Alkaline Phosphatase: 57 U/L (ref 38–126)
Anion gap: 10 (ref 5–15)
BUN: 40 mg/dL — ABNORMAL HIGH (ref 8–23)
CO2: 22 mmol/L (ref 22–32)
Calcium: 7.7 mg/dL — ABNORMAL LOW (ref 8.9–10.3)
Chloride: 104 mmol/L (ref 98–111)
Creatinine, Ser: 1.5 mg/dL — ABNORMAL HIGH (ref 0.61–1.24)
GFR calc Af Amer: 52 mL/min — ABNORMAL LOW (ref >60)
GFR calc non Af Amer: 45 mL/min — ABNORMAL LOW (ref >60)
Glucose, Bld: 279 mg/dL — ABNORMAL HIGH (ref 70–99)
Potassium: 4.9 mmol/L (ref 3.5–5.1)
Sodium: 136 mmol/L (ref 135–145)
Total Bilirubin: 0.8 mg/dL (ref 0.3–1.2)
Total Protein: 4.5 g/dL — ABNORMAL LOW (ref 6.5–8.1)

## 2019-05-23 LAB — FERRITIN: Ferritin: 621 ng/mL — ABNORMAL HIGH (ref 24–336)

## 2019-05-23 LAB — HEMOGLOBIN A1C
Hgb A1c MFr Bld: 6.8 % — ABNORMAL HIGH (ref 4.8–5.6)
Mean Plasma Glucose: 148.46 mg/dL

## 2019-05-23 LAB — OCCULT BLOOD X 1 CARD TO LAB, STOOL: Fecal Occult Bld: POSITIVE — AB

## 2019-05-23 LAB — FIBRINOGEN: Fibrinogen: 346 mg/dL (ref 210–475)

## 2019-05-23 LAB — PREPARE RBC (CROSSMATCH)

## 2019-05-23 LAB — C-REACTIVE PROTEIN: CRP: 1 mg/dL — ABNORMAL HIGH (ref <1.0)

## 2019-05-23 LAB — LACTATE DEHYDROGENASE: LDH: 251 U/L — ABNORMAL HIGH (ref 98–192)

## 2019-05-23 LAB — ABO/RH: ABO/RH(D): O POS

## 2019-05-23 LAB — D-DIMER, QUANTITATIVE: D-Dimer, Quant: 7.66 ug/mL-FEU — ABNORMAL HIGH (ref 0.00–0.50)

## 2019-05-23 MED ORDER — SODIUM CHLORIDE 0.9 % IV SOLN
50.0000 ug/h | INTRAVENOUS | Status: DC
  Administered 2019-05-23 – 2019-05-24 (×3): 50 ug/h via INTRAVENOUS
  Filled 2019-05-23 (×3): qty 1

## 2019-05-23 MED ORDER — INSULIN ASPART 100 UNIT/ML ~~LOC~~ SOLN
0.0000 [IU] | Freq: Three times a day (TID) | SUBCUTANEOUS | Status: DC
  Administered 2019-05-23 – 2019-05-24 (×2): 3 [IU] via SUBCUTANEOUS
  Administered 2019-05-24: 2 [IU] via SUBCUTANEOUS
  Administered 2019-05-24 – 2019-05-25 (×3): 3 [IU] via SUBCUTANEOUS
  Administered 2019-05-25 – 2019-05-26 (×2): 2 [IU] via SUBCUTANEOUS
  Administered 2019-05-26: 3 [IU] via SUBCUTANEOUS
  Administered 2019-05-26: 2 [IU] via SUBCUTANEOUS
  Administered 2019-05-27 – 2019-05-28 (×2): 3 [IU] via SUBCUTANEOUS
  Administered 2019-05-28 – 2019-05-29 (×2): 2 [IU] via SUBCUTANEOUS
  Administered 2019-05-29: 3 [IU] via SUBCUTANEOUS
  Administered 2019-05-29: 2 [IU] via SUBCUTANEOUS

## 2019-05-23 MED ORDER — PANTOPRAZOLE SODIUM 40 MG IV SOLR: 40.0000 mg | Freq: Two times a day (BID) | INTRAVENOUS | Status: DC

## 2019-05-23 MED ORDER — TECHNETIUM TC 99M-LABELED RED BLOOD CELLS IV KIT
26.5000 | PACK | Freq: Once | INTRAVENOUS | Status: AC | PRN
  Administered 2019-05-23: 26.5 via INTRAVENOUS

## 2019-05-23 MED ORDER — METHYLPREDNISOLONE SODIUM SUCC 125 MG IJ SOLR
60.0000 mg | Freq: Every day | INTRAMUSCULAR | Status: DC
  Administered 2019-05-23: 60 mg via INTRAVENOUS
  Filled 2019-05-23: qty 2

## 2019-05-23 MED ORDER — SODIUM CHLORIDE 0.9 % IV SOLN
8.0000 mg/h | INTRAVENOUS | Status: DC
  Administered 2019-05-23 – 2019-05-24 (×4): 8 mg/h via INTRAVENOUS
  Filled 2019-05-23 (×6): qty 80

## 2019-05-23 MED ORDER — LACTATED RINGERS IV BOLUS
500.0000 mL | Freq: Once | INTRAVENOUS | Status: AC
  Administered 2019-05-23: 500 mL via INTRAVENOUS

## 2019-05-23 MED ORDER — SODIUM CHLORIDE 0.9 % IV SOLN
80.0000 mg | Freq: Once | INTRAVENOUS | Status: AC
  Administered 2019-05-23: 80 mg via INTRAVENOUS
  Filled 2019-05-23: qty 80

## 2019-05-23 MED ORDER — CHLORHEXIDINE GLUCONATE CLOTH 2 % EX PADS
6.0000 | MEDICATED_PAD | Freq: Every day | CUTANEOUS | Status: DC
  Administered 2019-05-23: 6 via TOPICAL

## 2019-05-23 MED ORDER — SODIUM CHLORIDE 0.9% IV SOLUTION: Freq: Once | INTRAVENOUS | Status: AC

## 2019-05-23 MED ORDER — METHYLPREDNISOLONE SODIUM SUCC 125 MG IJ SOLR
60.0000 mg | Freq: Two times a day (BID) | INTRAMUSCULAR | Status: DC
  Administered 2019-05-23 – 2019-05-29 (×12): 60 mg via INTRAVENOUS
  Filled 2019-05-23 (×12): qty 2

## 2019-05-23 MED ORDER — SODIUM CHLORIDE 0.9 % IV SOLN
INTRAVENOUS | Status: DC
  Administered 2019-05-23 – 2019-05-24 (×2): 125 mL/h via INTRAVENOUS

## 2019-05-23 MED ORDER — PHENYLEPHRINE HCL-NACL 10-0.9 MG/250ML-% IV SOLN
25.0000 ug/min | INTRAVENOUS | Status: DC
  Administered 2019-05-23: 40 ug/min via INTRAVENOUS
  Administered 2019-05-23: 60 ug/min via INTRAVENOUS
  Filled 2019-05-23 (×2): qty 250

## 2019-05-23 MED ORDER — SODIUM CHLORIDE 0.9 % IV SOLN: 250.0000 mL | INTRAVENOUS | Status: DC

## 2019-05-23 MED ORDER — SODIUM CHLORIDE 0.9% IV SOLUTION: Freq: Once | INTRAVENOUS | Status: DC

## 2019-05-23 MED ORDER — SODIUM CHLORIDE 0.9 % IV BOLUS
1000.0000 mL | Freq: Once | INTRAVENOUS | Status: AC
  Administered 2019-05-23: 1000 mL via INTRAVENOUS

## 2019-05-23 NOTE — Consult Note (Signed)
NAME:  [name], MRN:  [id number], DOB:  11/16/1945, LOS: 60 ADMISSION DATE:  05/15/2019, CONSULTATION DATE:  05/23/19 REFERRING MD:  NP Kirby-Graham , CHIEF COMPLAINT:  GI bleeding  Brief History    [name] is a 73 year old with hx of CLL, CAD, BPH, GERD, esophageal dyspnagia, HTN, HFrEF admitted 12/9 with hypoxemic resp failure 2/2 covid pna..   History of present illness   Admitted 12/9 with hypoxemic respiratory failure.  This evening developed hematochezia, multiple episodes, with episodes of hypotension.  Also has worsening oxygen failure.   He is not able to provide much history.  Answers "yes I think so to most questions" cannot provide further details.   Unclear what his baseline mental status is.  Past Medical History   CLL, CAD, BPH, GERD, esophageal dyspnagia, HTN, HFrEF  Significant Hospital Events     Consults:  GI consulted by Internal Medicine NP.   Procedures:    Significant Diagnostic Tests:  CXR 2. Bilateral pneumonia with improvement on the left from prior exam. Micro Data:    Antimicrobials:    Interim history/subjective:  Transfer to the ICU for hypoxemic respiratory failure and pressor need  Objective   Blood pressure (!) 100/56, pulse 67, temperature (!) 97 F (36.1 C), temperature source Axillary, resp. rate 17, height 5\' 10"  (1.778 m), weight 65.2 kg, SpO2 99 %.        Intake/Output Summary (Last 24 hours) at 05/23/2019 0943 Last data filed at 05/23/2019 [id number] Gross per 24 hour  Intake 1810.29 ml  Output 500 ml  Net 1310.29 ml   Filed Weights   05/21/19 0421 05/22/19 0445 05/23/19 0351  Weight: 68 kg 67.3 kg 65.2 kg    Examination: General: Acutely ill appearing, NAD HENT: [place]/AT, PERRL, EOM-I and MMM Lungs: CTA bilaterally Cardiovascular: RRR, Nl S1/S2 and -M/R/G Abdomen: Soft, NT, ND and +BS Extremities: -edema and -tenderness Neuro: alert, not able to give details of history   I reviewed CXR myself, no acute disease  noted  Resolved Hospital Problem list     Assessment & Plan:  Acute GI bleed - hematochezia, no vomiting.  Anemia acute blood loss.  Blood transfusion pending (4 units pending). FFP pending (2 units) INR elevated 1.4.   GI consulted by Internal medicine team.  Protonix and octreotide.  H&H q6  Increase solumedrol to 60 BID  Acute on chronic renal failure. BMET in AM Replace electrolytes as indicated IVF as ordered  Covid PNA: on solumedrol, increased dose 3 days ago.  Completed remdesivir.   Now req higher doses oxygen, no resp distress.  Continue HFNC, no need for HHF Titrate O2 for sat of 85-88% as long as WOB is acceptable No diuretics  Best practice:  Diet: npo Pain/Anxiety/Delirium protocol (if indicated):  VAP protocol (if indicated):  DVT prophylaxis: hold lovenox GI prophylaxis:  Glucose control: ISS Mobility: bed rest Code Status: Full code Family Communication:  Disposition: ICU  Labs   CBC: Recent Labs  Lab 05/17/19 0429 05/18/19 0417 05/19/19 0352 05/20/19 0500 05/22/19 0358 05/23/19 0156 05/23/19 0625  WBC 45.7* 45.2* 43.8* 42.6* 44.8* 50.1* 48.5*  NEUTROABS 8.4* 8.0* 6.5 5.5 5.3  --   --   HGB 12.1* 12.2* 11.7* 12.9* 11.6* 10.3* 9.0*  HCT 38.5* 39.0 36.7* 40.7 35.3* 31.9* 28.7*  MCV 95.1 94.0 94.1 92.9 90.5 92.2 93.5  PLT 170 186 205 232 284 299 0000000    Basic Metabolic Panel: Recent Labs  Lab 05/17/19 0429 05/18/19  [id number] 05/19/19 0352 05/20/19 0500 05/22/19 0358 05/23/19 0156  NA 137 139 139 139 137 136  K 4.4 4.6 4.7 4.9 5.1 4.9  CL 109 110 110 109 105 104  CO2 19* 20* 20* 19* 22 22  GLUCOSE 176* 170* 171* 171* 218* 279*  BUN 34* 32* 32* 38* 36* 40*  CREATININE 1.41* 1.23 1.19 1.52* 1.31* 1.50*  CALCIUM 8.1* 8.1* 8.0* 8.3* 8.0* 7.7*  MG 2.0 2.0 2.1 2.2  --   --   PHOS 3.4 3.4 3.3 4.3  --   --    GFR: Estimated Creatinine Clearance: 39.2 mL/min (A) (by C-G formula based on SCr of 1.5 mg/dL (H)). Recent Labs  Lab  05/19/19 0352 05/20/19 0500 05/22/19 0358 05/23/19 0156 05/23/19 0625  PROCALCITON 0.27  --   --   --   --   WBC 43.8* 42.6* 44.8* 50.1* 48.5*    Liver Function Tests: Recent Labs  Lab 05/18/19 0417 05/19/19 0352 05/20/19 0500 05/22/19 0358 05/23/19 0156  AST 37 40 34 23 22  ALT 17 20 20 15 14   ALKPHOS 47 50 53 52 57  BILITOT 0.8 0.9 0.8 1.0 0.8  PROT 5.2* 4.9* 5.3* 4.9* 4.5*  ALBUMIN 2.3* 2.1* 2.3* 2.1* 2.0*   No results for input(s): LIPASE, AMYLASE in the last 168 hours. No results for input(s): AMMONIA in the last 168 hours.  ABG    Component Value Date/Time   PHART 7.475 (H) 05/19/2019 0549   PCO2ART 28.0 (L) 05/19/2019 0549   PO2ART 79.6 (L) 05/19/2019 0549   HCO3 20.4 05/19/2019 0549   TCO2 22 06/16/2015 1412   ACIDBASEDEF 2.7 (H) 05/19/2019 0549   O2SAT 95.9 05/19/2019 0549     Coagulation Profile: Recent Labs  Lab 05/23/19 0156  INR 1.4*    Cardiac Enzymes: No results for input(s): CKTOTAL, CKMB, CKMBINDEX, TROPONINI in the last 168 hours.  HbA1C: Hgb A1c MFr Bld  Date/Time Value Ref Range Status  11/23/2012 07:44 AM 5.7 (H) <5.7 % Final    Comment:    (NOTE)                                                                       According to the ADA Clinical Practice Recommendations for 2011, when HbA1c is used as a screening test:  >=6.5%   Diagnostic of Diabetes Mellitus           (if abnormal result is confirmed) 5.7-6.4%   Increased risk of developing Diabetes Mellitus References:Diagnosis and Classification of Diabetes Mellitus,Diabetes [id number] 1):S62-S69 and Standards of Medical Care in         Diabetes - 2011,Diabetes [id number] (Suppl 1):S11-S61.    CBG: Recent Labs  Lab 05/19/19 2109  GLUCAP 128*   The patient is critically ill with multiple organ systems failure and requires high complexity decision making for assessment and support, frequent evaluation and titration of therapies, application of advanced  monitoring technologies and extensive interpretation of multiple databases.   Critical Care Time devoted to patient care services described in this note is  35  Minutes. This time reflects time of care of this signee Dr Jennet Maduro. This critical care time does not reflect procedure time, or teaching time or supervisory time  of PA/NP/Med student/Med Resident etc but could involve care discussion time.  Rush Farmer, M.D. [facility] Pulmonary/Critical Care Medicine.

## 2019-05-23 NOTE — Progress Notes (Signed)
[place] Progress Note Patient Name: [name] DOB: January 31, 1946 MRN: [id number]   Date of Service  05/23/2019  HPI/Events of Note  Hypotension - patient just had another bloody stool and dropped BP to 72/50. BP now = 90/48. Currently getting a unit of PRBC.   eICU Interventions  Will order: 1. Bolus with 0.9 NaCl 1 liter IV over 1 hour now.  2. H/H at 6 AM.      Intervention Category Major Interventions: Hemorrhage - evaluation and management;Hypotension - evaluation and management  Lysle Dingwall 05/23/2019, 5:45 AM

## 2019-05-23 NOTE — Progress Notes (Signed)
PT Cancellation Note  Patient Details Name: [name] MRN: [id number] DOB: [birth year]   Cancelled Treatment:    Reason Eval/Treat Not Completed: Medical issues which prohibited therapy. Pt with GI bleed.   Shary Decamp [place] 05/23/2019, 3:09 PM [place] Pager [phone] Office [phone]

## 2019-05-23 NOTE — Progress Notes (Signed)
Notified Baltazar Najjar, NP that pt has had 6 episodes of large amounts of bright red blood with clots with small amount of stool. VS stable. Pt stated he felt he was going to pass out. Pt states he feels better now. Shanon Brow, rapid response RN notified of situation. Baltazar Najjar, NP came to beside to evaluate pt. Hemoccult sample sent to lab. Baltazar Najjar, NP ordered LR 500 cc bolus, protonix drip and 2 units of PRBC. Baltazar Najjar, NP stated to monitor BP and that she would call pt's wife. Will continue to monitor pt. Ranelle Oyster, RN

## 2019-05-23 NOTE — Consult Note (Signed)
[facility] Gastroenterology Consult  Referring Provider: Rush Farmer, MD Primary Care Physician:  [name], MD Primary Gastroenterologist: Herbie Baltimore Rourk,MD/Unassigned  Reason for Consultation: Painless hematochezia  HPI: [name] is a 73 y.o. male was admitted on 05/15/2019 with hypoxic respiratory failure secondary to Covid pneumonia.  Overnight patient had multiple episodes of hematochezia described as bright red and burgundy stool associated with episodes of hypotension and was transferred to ICU. As per his nurse at bedside, he has had a total of 9 bloody bowel movements, last one was at 11 AM today morning. Patient denies abdominal pain. He denies nausea, vomiting. States he has had difficulty swallowing for several months associated with some weight loss, although he is not able to quantify amount in pounds or over what period. His last colonoscopy was performed in 9/19 for surveillance and showed multiple diverticulosis in sigmoid and descending colon. Colonoscopy from 10/2012 showed left-sided diverticulosis and a tubular adenoma was removed from the cecum. Patient states he has had endoscopy several years ago, is not able to elaborate regarding indication or findings. Patient is on aspirin as well as Plavix, last dose of both were yesterday evening.   Past Medical History:  Diagnosis Date  . Acute MI, inferolateral wall, initial episode of care ([place]) 06/16/15   99% dCx   . BPH (benign prostatic hyperplasia) 04/08/2011  . CAD S/P percutaneous coronary angioplasty 11/23/12; 06/16/15   a. 2 overlapping mRCA lesions - Xience Xpedition DES 2.75 mm x 18 mm (3.0 mm);; b. Inf-Lat STEMI 06/16/2015 - dCx Asp Thrombectomy & PCI 3.0 x 15 Xience drDES (3.6 mm)  . Chronic headache   . CLL (chronic lymphocytic leukemia) ([place]) 04/08/2011  . Depression   . Diverticulitis   . Gallstone   . GERD (gastroesophageal reflux disease)   . Hypercholesteremia   . Ischemic cardiomyopathy    Echo  06/18/2015 EF 35-40% after lateral MI -> repeat echo 08/06/2015: EF 40-45% with basal-mid inferolateral akinesis and hypokinesis of the lateral wall.  . NSTEMI (non-ST elevated myocardial infarction) ([place]) 11/23/12   NSTEMI 11/23/2012 RCA lesion - PCI with DES; moderate LAD disease  . PTSD (post-traumatic stress disorder)    With Depression - since MI    Past Surgical History:  Procedure Laterality Date  . APPENDECTOMY  1968-70  . CARDIAC CATHETERIZATION N/A 06/16/2015   Procedure: Left Heart Cath and Coronary Angiography;  Surgeon: Jettie Booze, MD;  Location: [place] [facility];  Service: Cardiovascular: 99% thrombotic dCx -> asp thrombectomy & PCI. RCA Stents Patent.   Marland Kitchen CARDIAC CATHETERIZATION N/A 06/16/2015   Procedure: Coronary Stent Intervention;  Surgeon: Jettie Booze, MD;  Location: [place] [facility];  Service: Cardiovascular: PCI dCx = thrombectomy -> 3.0 x 15 Xience drug-eluting stent (3.6 mm)   . COLONOSCOPY  2008   [id number] diverticula, diminutive polyp in the cecum, s/p bx Normal rectum. Tubular adenoma  . COLONOSCOPY N/A 10/17/2012   Dr. Gala Romney: colonic diverticulosis, tubular adenoma, surveillance due May 2019  . COLONOSCOPY N/A 02/21/2018   Procedure: COLONOSCOPY;  Surgeon: Daneil Dolin, MD;  Location[facility];  Service: Endoscopy;  Laterality: N/A;  12:00  . CORONARY STENT PLACEMENT  11/23/12   Mid RCA -- Xience eXp - 2.75 mm x 18 mm DES (3.52mm) , Dr. Ellyn Hack  . LEFT HEART CATHETERIZATION WITH CORONARY ANGIOGRAM N/A 11/23/2012   Procedure: LEFT HEART CATHETERIZATION WITH CORONARY ANGIOGRAM;  Surgeon: Leonie Man, MD;  Location: [facility] CATH LAB;  Service:  Cardiovascular: NSTEMI: Culprit = mid RCA 95% & 75%; LAD ~40-50%, Small RI ~60%; EF ~60%, mild inf-basal HK     . TRANSTHORACIC ECHOCARDIOGRAM  January 2017; March 2017   a. EF 35-40%. Entire inferior hypokinesis and akinesis of the basal and mid inferolateral and anterolateral /distal lateral walls;; b.  EF 40 and 45%. Akinesis of basal-mid inferolateral wall. Hypokinesis of entire lateral wall.    Prior to Admission medications   Medication Sig Start Date End Date Taking? Authorizing Provider  aspirin EC 81 MG tablet Take 81 mg by mouth daily.   Yes [provider]  cholecalciferol (VITAMIN D) 1000 units tablet Take 1,000 Units by mouth once a week.   Yes [provider]  clopidogrel (PLAVIX) 75 MG tablet Take 75 mg by mouth daily. 03/30/19  Yes [provider]  furosemide (LASIX) 20 MG tablet Take 1 tablet (20 mg total) by mouth daily. 04/15/19  Yes Gosrani, Nimish C, MD  lisinopril (ZESTRIL) 5 MG tablet Take 1 tablet (5 mg total) by mouth daily. 04/15/19  Yes Gosrani, Nimish C, MD  pantoprazole (PROTONIX) 40 MG tablet TAKE ONE TABLET BY MOUTH 30 MINUTES BEFORE A MEAL ONCE OR TWICE DAILY Patient taking differently: Take 40 mg by mouth 2 (two) times daily before a meal. 30 MINUTES BEFORE A MEAL ONCE OR TWICE DAILY 03/20/19  Yes Erenest Rasher, PA-C  phenazopyridine (PYRIDIUM) 200 MG tablet Take 1 tablet (200 mg total) by mouth 3 (three) times daily as needed for pain. 05/10/19  Yes Wurst, [place], PA-C  simvastatin (ZOCOR) 40 MG tablet TAKE 1 TABLET BY MOUTH EVERY DAY Patient taking differently: Take 40 mg by mouth daily at 6 PM.  04/28/19  Yes Gosrani, Nimish C, MD  tamsulosin (FLOMAX) 0.4 MG CAPS capsule Take 1 capsule (0.4 mg total) by mouth daily after breakfast. 05/10/19  Yes Wurst, [place], PA-C  lisinopril (PRINIVIL,ZESTRIL) 2.5 MG tablet Take 1 tablet (2.5 mg total) by mouth daily. 06/18/15   Almyra Deforest, PA  nitroGLYCERIN (NITROSTAT) 0.4 MG SL tablet Place 1 tablet (0.4 mg total) under the tongue every 5 (five) minutes as needed for chest pain. Patient not taking: Reported on 05/14/2018 09/30/15 11/29/18  Leonie Man, MD  simvastatin (ZOCOR) 40 MG tablet Take 1 tablet (40 mg total) by mouth every other day. NEED OV. 08/15/18   Leonie Man, MD    Current  Facility-Administered Medications  Medication Dose Route Frequency Provider Last Rate Last Admin  . 0.9 %  sodium chloride infusion (Manually program via Guardrails IV Fluids)   Intravenous Once Gardiner Barefoot, NP   Stopped at 05/23/19 [phone]  . 0.9 %  sodium chloride infusion   Intravenous Continuous Gardiner Barefoot, NP 125 mL/hr at 05/23/19 1311 New Bag at 05/23/19 1311  . 0.9 %  sodium chloride infusion  250 mL Intravenous Continuous Anders Simmonds, MD      . acetaminophen (TYLENOL) tablet 650 mg  650 mg Oral Q6H PRN Vashti Hey, MD   650 mg at 05/16/19 2208  . albuterol (VENTOLIN HFA) 108 (90 Base) MCG/ACT inhaler 2 puff  2 puff Inhalation Q8H Hall, Carole N, DO   2 puff at 05/23/19 1340  . Chlorhexidine Gluconate Cloth 2 % PADS 6 each  6 each Topical Daily Vashti Hey, MD   6 each at 05/23/19 [phone]  . cholecalciferol (VITAMIN D3) tablet 1,000 Units  1,000 Units Oral Daily Hall, Carole N, DO   1,000 Units at  05/22/19 0845  . insulin aspart (novoLOG) injection 0-15 Units  0-15 Units Subcutaneous TID WC Rush Farmer, MD   3 Units at 05/23/19 1149  . ipratropium (ATROVENT HFA) inhaler 2 puff  2 puff Inhalation Q8H Hall, Carole N, DO   2 puff at 05/23/19 1340  . MEDLINE mouth rinse  15 mL Mouth Rinse BID Irene Pap N, DO   15 mL at 05/22/19 2236  . methylPREDNISolone sodium succinate (SOLU-MEDROL) 125 mg/2 mL injection 60 mg  60 mg Intravenous Q12H Rush Farmer, MD      . octreotide (SANDOSTATIN) 500 mcg in sodium chloride 0.9 % 250 mL (2 mcg/mL) infusion  50 mcg/hr Intravenous Continuous Collier Bullock, MD 20 mL/hr at 05/23/19 1200 50 mcg/hr at 05/23/19 1200  . ondansetron (ZOFRAN) tablet 4 mg  4 mg Oral Q6H PRN Vashti Hey, MD       Or  . ondansetron [facility]) injection 4 mg  4 mg Intravenous Q6H PRN Bonnell Public Tublu, MD      . pantoprazole (PROTONIX) 80 mg in sodium chloride 0.9 % 250 mL (0.32 mg/mL) infusion  8 mg/hr  Intravenous Continuous Gardiner Barefoot, NP 25 mL/hr at 05/23/19 1205 8 mg/hr at 05/23/19 1205  . [START ON 05/26/2019] pantoprazole (PROTONIX) injection 40 mg  40 mg Intravenous Q12H Kirby-Graham, Karsten Fells, NP      . phenylephrine (NEOSYNEPHRINE) 10-0.9 MG/250ML-% infusion  25-200 mcg/min Intravenous Titrated Anders Simmonds, MD 45 mL/hr at 05/23/19 1200 30 mcg/min at 05/23/19 1200  . simvastatin (ZOCOR) tablet 40 mg  40 mg Oral Daily Bonnell Public Tublu, MD   40 mg at 05/22/19 1810  . sodium bicarbonate tablet 325 mg  325 mg Oral BID Jennye Boroughs, MD   325 mg at 05/22/19 2232  . tamsulosin (FLOMAX) capsule 0.4 mg  0.4 mg Oral QPC breakfast Irene Pap N, DO   0.4 mg at 05/22/19 0844  . vitamin C (ASCORBIC ACID) tablet 500 mg  500 mg Oral Daily Irene Pap N, DO   500 mg at 05/22/19 0844  . zinc sulfate capsule 220 mg  220 mg Oral Daily Irene Pap N, DO   220 mg at 05/22/19 0845    Allergies as of 05/15/2019  . (No Known Allergies)    Family History  Problem Relation Age of Onset  . Diabetes Mother   . Cancer Father   . Colon cancer Neg Hx     Social History   Socioeconomic History  . Marital status: Married    Spouse name: Not on file  . Number of children: Not on file  . Years of education: Not on file  . Highest education level: Not on file  Occupational History  . Not on file  Tobacco Use  . Smoking status: Former Smoker    Packs/day: 0.25    Years: 2.00    Pack years: 0.50    Types: Cigarettes    Quit date: 06/07/1963    Years since quitting: 55.9  . Smokeless tobacco: Never Used  Substance and Sexual Activity  . Alcohol use: No    Alcohol/week: 0.0 standard drinks    Comment: Occasionally drinks red wine  . Drug use: No  . Sexual activity: Not on file  Other Topics Concern  . Not on file  Social History Narrative   Exercises 3 to 4 days a week at the fitness center for about a half an hour at a time.   "former smoker". Does not  drink EtOH    Married.   Social Determinants of Health   Financial Resource Strain:   . Difficulty of Paying Living Expenses: Not on file  Food Insecurity:   . Worried About Charity fundraiser in the Last Year: Not on file  . Ran Out of Food in the Last Year: Not on file  Transportation Needs:   . Lack of Transportation (Medical): Not on file  . Lack of Transportation (Non-Medical): Not on file  Physical Activity:   . Days of Exercise per Week: Not on file  . Minutes of Exercise per Session: Not on file  Stress:   . Feeling of Stress : Not on file  Social Connections:   . Frequency of Communication with Friends and Family: Not on file  . Frequency of Social Gatherings with Friends and Family: Not on file  . Attends Religious Services: Not on file  . Active Member of Clubs or Organizations: Not on file  . Attends Archivist Meetings: Not on file  . Marital Status: Not on file  Intimate Partner Violence:   . Fear of Current or Ex-Partner: Not on file  . Emotionally Abused: Not on file  . Physically Abused: Not on file  . Sexually Abused: Not on file    Review of Systems: Positive for: GI: Described in detail in HPI.    Gen: fatigue, weakness,Denies any fever, chills, rigors, night sweats, anorexia, malaise, involuntary weight loss, and sleep disorder CV: Denies chest pain, angina, palpitations, syncope, orthopnea, PND, peripheral edema, and claudication. Resp: Denies dyspnea, cough, sputum, wheezing, coughing up blood. GU : Denies urinary burning, blood in urine, urinary frequency, urinary hesitancy, nocturnal urination, and urinary incontinence. MS: Denies joint pain or swelling.  Denies muscle weakness, cramps, atrophy.  Derm: Denies rash, itching, oral ulcerations, hives, unhealing ulcers.  Psych: Denies depression, anxiety, memory loss, suicidal ideation, hallucinations,  and confusion. Heme: Rectal bleeding, Denies bruising,and enlarged lymph nodes. Neuro:  Denies any  headaches, dizziness, paresthesias. Endo:  Denies any problems with DM, thyroid, adrenal function.  Physical Exam: Vital signs in last 24 hours: Temp:  [96.2 F (35.7 C)-98.3 F (36.8 C)] 97.4 F (36.3 C) (12/17 1304) Pulse Rate:  [58-108] 71 (12/17 1304) Resp:  [14-37] 20 (12/17 1304) BP: (57-140)/(39-75) 119/75 (12/17 1304) SpO2:  [77 %-100 %] 96 % (12/17 1300) Weight:  [65.2 kg] 65.2 kg (12/17 0351) Last BM Date: 05/23/19  General:   Alert,  Well-developed, well-nourished, pleasant and cooperative in NAD Head:  Normocephalic and atraumatic. Eyes:  Sclera clear, no icterus.  Prominent pallor Ears:  Normal auditory acuity. Nose:  No deformity, discharge,  or lesions. Mouth:  No deformity or lesions.  Oropharynx pink & moist. Neck:  Supple; no masses or thyromegaly. Lungs: On 5 L oxygen via nasal cannula, able to speak in full sentences , not using accessory muscles of respiration  Heart:  Regular rate and rhythm Extremities:  Without clubbing or edema. Neurologic:  Alert and  oriented x4;  grossly normal neurologically. Skin:  Intact without significant lesions or rashes. Psych:  Alert and cooperative. Normal mood and affect. Abdomen:  Soft, nontender and nondistended. No masses, hepatosplenomegaly or hernias noted. Normal bowel sounds, without guarding, and without rebound.         Lab Results: Recent Labs    05/22/19 0358 05/23/19 0156 05/23/19 0625  WBC 44.8* 50.1* 48.5*  HGB 11.6* 10.3* 9.0*  HCT 35.3* 31.9* 28.7*  PLT 284 299 246   BMET  Recent Labs    05/22/19 0358 05/23/19 0156  NA 137 136  K 5.1 4.9  CL 105 104  CO2 22 22  GLUCOSE 218* 279*  BUN 36* 40*  CREATININE 1.31* 1.50*  CALCIUM 8.0* 7.7*   LFT Recent Labs    05/23/19 0156  PROT 4.5*  ALBUMIN 2.0*  AST 22  ALT 14  ALKPHOS 57  BILITOT 0.8   PT/INR Recent Labs    05/23/19 0156  LABPROT 17.3*  INR 1.4*    Studies/Results: DG CHEST PORT 1 VIEW  Result Date:  05/23/2019 CLINICAL DATA:  Central line placement.  COVID-19 positive. EXAM: PORTABLE CHEST 1 VIEW COMPARISON:  Radiograph and CT 05/19/2019 FINDINGS: Right internal jugular central venous catheter tip at the atrial caval junction. No pneumothorax. Bilateral heterogeneous airspace opacities with improvement on the left from prior exam. Right lung opacities are not significantly changed. Unchanged heart size and mediastinal contours. No large pleural effusion. IMPRESSION: 1. Right internal jugular central venous catheter tip at the atrial caval junction. No pneumothorax. 2. Bilateral pneumonia with improvement on the left from prior exam. Electronically Signed   By: Keith Rake M.D.   On: 05/23/2019 06:58    Impression: Painless hematochezia History of diverticulosis, last colonoscopy 2019 Presentation suspicious for diverticular bleeding Last dose of aspirin and Plavix was yesterday evening So far has received 2 units of FFP and 4 units of PRBC Was started on octreotide drip and IV Protonix drip empirically   Hemoglobin yesterday 11.6, gradually has dropped down to 10.3 and 9 Patient on low-dose of IV phenylephrine, not tachycardic PT/INR 17.3/1.4  Other comorbidities include CLL( WBC count 48.5) Renal impairment, BUN/creatinine/GFR of 40/1.5/45  Prior CT abdomen and pelvis without contrast from 4/19-did not reveal any abnormality of the liver, stomach within normal limits.  Diverticulosis of sigmoid noted. Barium swallow from 11/17 for dysphagia showed small sliding hiatal hernia with narrowing at GE junction, Schatzki's ring   COVID-19 positive test with Acute Pneumonia-D-dimer 7.66, fibrinogen 346, on IV methylprednisolone 60 mg every 12 hours,Completed remdesivir   Plan: Stat Bleeding scan, if positive recommend IR guided embolization for possible diverticular bleed. Continue to hold both aspirin and Plavix. Continue IV Protonix drip. There is no history of chronic liver  disease or evidence of liver disease on prior CT from 09/2017 (although was done without contrast). If bleeding scan is negative, octreotide may be discontinued. Majority of the times, diverticular bleeding stops spontaneously, however, if bleeding is ongoing IR guided embolization may be required urgently. Discussed the same with the patient and his nurse at bedside.   LOS: 8 days   Ronnette Juniper, MD  05/23/2019, 1:49 PM

## 2019-05-23 NOTE — Progress Notes (Addendum)
Shift event:  Brief HPI: Mr. Madani is a 73 yo male with a PMHx of CLL, BPH, CHF with last EF of 45%, HTN, and CAD. He was admitted 8 days ago with sepsis and acute respiratory failure secondary to COVID 19 and pneumonia. He is on Remdesivir and steroids. AKI thought to be secondary to dehydration. Had hypotension on admission but resolved prior to tonight's events.   RN paged NP around 2245 hours 12/16 stating pt was having bloody stools. BRB with clots present. NP placed orders for protonix drip, stopped Lovenox (but had a dose 12/16 at 2200 hrs), stopped Plavix, added SCDs, ordered serial CBCs, and made pt NPO except ice chips.  Shortly after above, RN paged NP that pt had 4 more bloody BMs. Occult +. Hgb 10.6 (11.8). NP to bedside.  S: Denies pain. No nausea/vomiting. Says breathing is good. No dizziness at present. Can't remember if he had a colonoscopy in the past but does not recall ever having rectal bleeding. States he occasionally uses ibuprofen at home for arthritic pains. Denies using ETOH. Wife said pt had diverticulitis once.  O: Appears fairly well. Non toxic. NAD. Alert, oriented. BP 90s. HR 70-90. SaO2 94% on 8L. S1S2, RRR. No increased respiratory effort. Abdomen with + BS x 4 quadrants. Soft, NT, ND. Skin is slightly pale, warm and dry.  A/P: 1. Acute blood loss secondary to likely diverticular bleed causing hypotension. Bolus ordered. Maintenance IVF started. Ordered 2 Units of PRBCs. Hgb is 10.6, but pt had several more bloody BMs after that was drawn, so NP suspects his Hgb to be around 7. Since he continues to bleed, will go ahead and TF. Check INR-1.4-was 1.2. Consider FFP if bleeding continues.  After above, pt had episode of another bloody BM. (total of 9 BMs since 2245 hrs). O2 sat fell into the 70s and O2 bumped up to 15L and was over 90% afterwards. He became dizzy, lethargic and very pale. BP was 57/49. LR bolus started and NP to bedside again urgently.  Upon NP arrival, pt's  BP up to 92-106. HR 70s. O2 sat 88-92% on 15L. He is more pale than prior visit. Skin is dry. He is alert and oriented still. NP called PCCM for consult and permission to move pt to ICU. Discussed case with PCCM and agreed with moving pt to ICU.  NP discussed case with GI on call. GI recommended a tag study as soon as pt able and to call IR if positive.  Upon arrival to ICU, pt's BP 109/73. HR 73. O2 91% on 15L. Discussed case with new RNs. Informed RN that once blood transfusion begins to call NP, so maintenance fluids can be decreased given pt's hx of CHF. At present, pt has no sx of overload. His creat was increased to 1.5, so he may have been a little dry prior to above events.  Wife called and updated on events.  KJKG, NP [facility] Update: Talked to RN in ICU. Pt's BP in the 60s, so NP left maintenance fluids at 125cc/hr. PCCM is on unit seeing pt. Pt to get a total of 4Units PRBCs and 2 U FFP now.  KJKG, NP [facility] CRITICAL CARE Performed by: Gardiner Barefoot Total critical care time: start 2300 hrs end 2400 hrs on 05/22/2019. Restart 0240. End 0330 on 05/23/2019. Total of 60 mins on 12/16 and 50 mins on 12/17.   Critical care time was exclusive of separately billable procedures and treating other patients. Critical care was  necessary to treat or prevent imminent or life-threatening deterioration. Critical care was time spent personally by me on the following activities: development of treatment plan with patient and/or surrogate as well as nursing, discussions with consultants, evaluation of patient's response to treatment, examination of patient, obtaining history from patient or surrogate, ordering and performing treatments and interventions, ordering and review of laboratory studies, ordering and review of radiographic studies, pulse oximetry and re-evaluation of patient's condition.

## 2019-05-23 NOTE — Consult Note (Signed)
NAME:  [name], MRN:  [id number], DOB:  02-10-1946, LOS: 87 ADMISSION DATE:  05/15/2019, CONSULTATION DATE:  05/23/19 REFERRING MD:  NP Kirby-Graham , CHIEF COMPLAINT:  GI bleeding  Brief History    [name] is a 73 year old with hx of CLL, CAD, BPH, GERD, esophageal dyspnagia, HTN, HFrEF admitted 12/9 with hypoxemic resp failure 2/2 covid pna..   History of present illness   Admitted 12/9 with hypoxemic respiratory failure.  This evening developed hematochezia, multiple episodes, with episodes of hypotension.  Also has worsening oxygen failure.   He is not able to provide much history.  Answers "yes I think so to most questions" cannot provide further details.   Unclear what his baseline mental status is.  Past Medical History   CLL, CAD, BPH, GERD, esophageal dyspnagia, HTN, HFrEF  Significant Hospital Events     Consults:  GI consulted by Internal Medicine NP.   Procedures:    Significant Diagnostic Tests:  CXR 2. Bilateral pneumonia with improvement on the left from prior exam. Micro Data:    Antimicrobials:    Interim history/subjective:    Objective   Blood pressure (!) 108/55, pulse 90, temperature (!) 96.5 F (35.8 C), resp. rate (!) 23, height 5\' 10"  (1.778 m), weight 65.2 kg, SpO2 94 %.        Intake/Output Summary (Last 24 hours) at 05/23/2019 0753 Last data filed at 05/23/2019 [id number] Gross per 24 hour  Intake 1810.29 ml  Output 500 ml  Net 1310.29 ml   Filed Weights   05/21/19 0421 05/22/19 0445 05/23/19 0351  Weight: 68 kg 67.3 kg 65.2 kg    Examination: General: alert pleasant.  HENT: NCAT Lungs: CTAB Cardiovascular: RRR no mgr Abdomen: nt, ND, nbs Extremities: no edema Neuro: alert, not able to give details of history    Resolved Hospital Problem list     Assessment & Plan:  Acute GI bleed - hematochezia, no vomiting.  Anemia acute blood loss.  Blood transfusion pending (4 units pending). FFP pending (2 units) INR elevated  1.4.   GI consulted by Internal medicine team.  Protonix and octreotide.   Decrease solumedrol dose. Hold asa, hold antihypertensives.    Acute on chronic renal failure.  Covid PNA: on solumedrol, increased dose 3 days ago.  Completed remdesivir.   Now req higher doses oxygen, no resp distress.  Cont HFNC  Best practice:  Diet: npo Pain/Anxiety/Delirium protocol (if indicated):  VAP protocol (if indicated):  DVT prophylaxis: hold lovenox GI prophylaxis:  Glucose control: ISS Mobility: bed rest Code Status: Full code Family Communication:  Disposition: ICU  Labs   CBC: Recent Labs  Lab 05/17/19 0429 05/18/19 0417 05/19/19 0352 05/20/19 0500 05/22/19 0358 05/23/19 0156 05/23/19 0625  WBC 45.7* 45.2* 43.8* 42.6* 44.8* 50.1* 48.5*  NEUTROABS 8.4* 8.0* 6.5 5.5 5.3  --   --   HGB 12.1* 12.2* 11.7* 12.9* 11.6* 10.3* 9.0*  HCT 38.5* 39.0 36.7* 40.7 35.3* 31.9* 28.7*  MCV 95.1 94.0 94.1 92.9 90.5 92.2 93.5  PLT 170 186 205 232 284 299 0000000    Basic Metabolic Panel: Recent Labs  Lab 05/17/19 0429 05/18/19 0417 05/19/19 0352 05/20/19 0500 05/22/19 0358 05/23/19 0156  NA 137 139 139 139 137 136  K 4.4 4.6 4.7 4.9 5.1 4.9  CL 109 110 110 109 105 104  CO2 19* 20* 20* 19* 22 22  GLUCOSE 176* 170* 171* 171* 218* 279*  BUN 34* 32* 32* 38* 36* 40*  CREATININE 1.41* 1.23 1.19 1.52* 1.31* 1.50*  CALCIUM 8.1* 8.1* 8.0* 8.3* 8.0* 7.7*  MG 2.0 2.0 2.1 2.2  --   --   PHOS 3.4 3.4 3.3 4.3  --   --    GFR: Estimated Creatinine Clearance: 39.2 mL/min (A) (by C-G formula based on SCr of 1.5 mg/dL (H)). Recent Labs  Lab 05/19/19 0352 05/20/19 0500 05/22/19 0358 05/23/19 0156 05/23/19 0625  PROCALCITON 0.27  --   --   --   --   WBC 43.8* 42.6* 44.8* 50.1* 48.5*    Liver Function Tests: Recent Labs  Lab 05/18/19 0417 05/19/19 0352 05/20/19 0500 05/22/19 0358 05/23/19 0156  AST 37 40 34 23 22  ALT 17 20 20 15 14   ALKPHOS 47 50 53 52 57  BILITOT 0.8 0.9 0.8 1.0  0.8  PROT 5.2* 4.9* 5.3* 4.9* 4.5*  ALBUMIN 2.3* 2.1* 2.3* 2.1* 2.0*   No results for input(s): LIPASE, AMYLASE in the last 168 hours. No results for input(s): AMMONIA in the last 168 hours.  ABG    Component Value Date/Time   PHART 7.475 (H) 05/19/2019 0549   PCO2ART 28.0 (L) 05/19/2019 0549   PO2ART 79.6 (L) 05/19/2019 0549   HCO3 20.4 05/19/2019 0549   TCO2 22 06/16/2015 1412   ACIDBASEDEF 2.7 (H) 05/19/2019 0549   O2SAT 95.9 05/19/2019 0549     Coagulation Profile: Recent Labs  Lab 05/23/19 0156  INR 1.4*    Cardiac Enzymes: No results for input(s): CKTOTAL, CKMB, CKMBINDEX, TROPONINI in the last 168 hours.  HbA1C: Hgb A1c MFr Bld  Date/Time Value Ref Range Status  11/23/2012 07:44 AM 5.7 (H) <5.7 % Final    Comment:    (NOTE)                                                                       According to the ADA Clinical Practice Recommendations for 2011, when HbA1c is used as a screening test:  >=6.5%   Diagnostic of Diabetes Mellitus           (if abnormal result is confirmed) 5.7-6.4%   Increased risk of developing Diabetes Mellitus References:Diagnosis and Classification of Diabetes Mellitus,Diabetes [id number] 1):S62-S69 and Standards of Medical Care in         Diabetes - 2011,Diabetes [id number] (Suppl 1):S11-S61.    CBG: Recent Labs  Lab 05/19/19 2109  GLUCAP 128*    Review of Systems:   Unable to assess  Past Medical History  He,  has a past medical history of Acute MI, inferolateral wall, initial episode of care ([place]) (06/16/15), BPH (benign prostatic hyperplasia) (04/08/2011), CAD S/P percutaneous coronary angioplasty (11/23/12; 06/16/15), Chronic headache, CLL (chronic lymphocytic leukemia) ([place]) (04/08/2011), Depression, Diverticulitis, Gallstone, GERD (gastroesophageal reflux disease), Hypercholesteremia, Ischemic cardiomyopathy, NSTEMI (non-ST elevated myocardial infarction) ([place]) (11/23/12), and PTSD (post-traumatic stress disorder).    Surgical History    Past Surgical History:  Procedure Laterality Date  . APPENDECTOMY  1968-70  . CARDIAC CATHETERIZATION N/A 06/16/2015   Procedure: Left Heart Cath and Coronary Angiography;  Surgeon: Jettie Booze, MD;  Location: [facility];  Service: Cardiovascular: 99% thrombotic dCx -> asp thrombectomy & PCI. RCA Stents Patent.   Marland Kitchen CARDIAC CATHETERIZATION N/A 06/16/2015  Procedure: Coronary Stent Intervention;  Surgeon: Jettie Booze, MD;  Location: [facility];  Service: Cardiovascular: PCI dCx = thrombectomy -> 3.0 x 15 Xience drug-eluting stent (3.6 mm)   . COLONOSCOPY  2008   [id number] diverticula, diminutive polyp in the cecum, s/p bx Normal rectum. Tubular adenoma  . COLONOSCOPY N/A 10/17/2012   Dr. Gala Romney: colonic diverticulosis, tubular adenoma, surveillance due May 2019  . COLONOSCOPY N/A 02/21/2018   Procedure: COLONOSCOPY;  Surgeon: Daneil Dolin, MD;  [facility];  Service: Endoscopy;  Laterality: N/A;  12:00  . CORONARY STENT PLACEMENT  11/23/12   Mid RCA -- Xience eXp - 2.75 mm x 18 mm DES (3.43mm) , Dr. Ellyn Hack  . LEFT HEART CATHETERIZATION WITH CORONARY ANGIOGRAM N/A 11/23/2012   Procedure: LEFT HEART CATHETERIZATION WITH CORONARY ANGIOGRAM;  Surgeon: Leonie Man, MD;  [facility];  Service: Cardiovascular: NSTEMI: Culprit = mid RCA 95% & 75%; LAD ~40-50%, Small RI ~60%; EF ~60%, mild inf-basal HK     . TRANSTHORACIC ECHOCARDIOGRAM  January 2017; March 2017   a. EF 35-40%. Entire inferior hypokinesis and akinesis of the basal and mid inferolateral and anterolateral /distal lateral walls;; b. EF 40 and 45%. Akinesis of basal-mid inferolateral wall. Hypokinesis of entire lateral wall.     Social History   reports that he quit smoking about 55 years ago. His smoking use included cigarettes. He has a 0.50 pack-year smoking history. He has never used smokeless tobacco. He reports that he does not drink alcohol or use  drugs.   Family History   His family history includes Cancer in his father; Diabetes in his mother. There is no history of Colon cancer.   Allergies No Known Allergies   Home Medications  Prior to Admission medications   Medication Sig Start Date End Date Taking? Authorizing Provider  aspirin EC 81 MG tablet Take 81 mg by mouth daily.   Yes [provider]  cholecalciferol (VITAMIN D) 1000 units tablet Take 1,000 Units by mouth once a week.   Yes [provider]  clopidogrel (PLAVIX) 75 MG tablet Take 75 mg by mouth daily. 03/30/19  Yes [provider]  furosemide (LASIX) 20 MG tablet Take 1 tablet (20 mg total) by mouth daily. 04/15/19  Yes Gosrani, Nimish C, MD  lisinopril (ZESTRIL) 5 MG tablet Take 1 tablet (5 mg total) by mouth daily. 04/15/19  Yes Gosrani, Nimish C, MD  pantoprazole (PROTONIX) 40 MG tablet TAKE ONE TABLET BY MOUTH 30 MINUTES BEFORE A MEAL ONCE OR TWICE DAILY Patient taking differently: Take 40 mg by mouth 2 (two) times daily before a meal. 30 MINUTES BEFORE A MEAL ONCE OR TWICE DAILY 03/20/19  Yes Erenest Rasher, PA-C  phenazopyridine (PYRIDIUM) 200 MG tablet Take 1 tablet (200 mg total) by mouth 3 (three) times daily as needed for pain. 05/10/19  Yes Wurst, [place], PA-C  simvastatin (ZOCOR) 40 MG tablet TAKE 1 TABLET BY MOUTH EVERY DAY Patient taking differently: Take 40 mg by mouth daily at 6 PM.  04/28/19  Yes Gosrani, Nimish C, MD  tamsulosin (FLOMAX) 0.4 MG CAPS capsule Take 1 capsule (0.4 mg total) by mouth daily after breakfast. 05/10/19  Yes Wurst, [place], PA-C  lisinopril (PRINIVIL,ZESTRIL) 2.5 MG tablet Take 1 tablet (2.5 mg total) by mouth daily. 06/18/15   Almyra Deforest, PA  nitroGLYCERIN (NITROSTAT) 0.4 MG SL tablet Place 1 tablet (0.4 mg total) under the tongue every 5 (five) minutes as needed  for chest pain. Patient not taking: Reported on 05/14/2018 09/30/15 11/29/18  Leonie Man, MD  simvastatin (ZOCOR) 40 MG tablet Take 1  tablet (40 mg total) by mouth every other day. NEED OV. 08/15/18   Leonie Man, MD     Critical care time: 60 minutes

## 2019-05-23 NOTE — Progress Notes (Signed)
CRITICAL VALUE ALERT  Critical Value:  WBC 50.1  Date & Time Notified:  05/23/19 [id number]  Provider Notified:  Tylene Fantasia, NP  Orders Received/Actions taken:  Provider notified, no new orders received

## 2019-05-23 NOTE — Procedures (Signed)
Central Venous Catheter Insertion Procedure Note EASTER ARONOW [id number] [birth year]  Procedure: Insertion of Central Venous Catheter Indications: Assessment of intravascular volume, Drug and/or fluid administration and Frequent blood sampling  Procedure Details Consent: Risks of procedure as well as the alternatives and risks of each were explained to the (patient/caregiver).  Consent for procedure obtained. Time Out: Verified patient identification, verified procedure, site/side was marked, verified correct patient position, special equipment/implants available, medications/allergies/relevent history reviewed, required imaging and test results available.  Performed  Maximum sterile technique was used including antiseptics, cap, gloves, gown, hand hygiene, mask and sheet. Skin prep: Chlorhexidine; local anesthetic administered A antimicrobial bonded/coated triple lumen catheter was placed in the right internal jugular vein using the Seldinger technique.  Evaluation Blood flow good Complications: No apparent complications Patient did tolerate procedure well. Chest X-ray ordered to verify placement.  CXR: pending.  Procedure performed under direct ultrasound guidance for real time vessel cannulation.      Montey Hora, [facility] 05/23/2019, 6:41 AM

## 2019-05-23 NOTE — Progress Notes (Signed)
At 0238 pt had another episode of large bright red blood with large clots incontinently. Pt stated he felt he was going to pass out, pt became dizzy, and slightly lethargic and pale. BP dropped to 57/39 and O2 sat dropped in the 70's on 8L High flow nasal cannula.  Turned [place] high flow to 15L. Pt's O2 sat 88% 15L High flow [place]. Notified Baltazar Najjar, NP and Waunita Schooner, rapid response RN. Waunita Schooner, rapid response stated to turn LR to 920ml/hr rate, pt received a total of 1L of LR. Baltazar Najjar, NP and Waunita Schooner came to bedside to assess pt. Pt states he feels a little better now. Pt has a total of 9 episodes of GI bleed this shift. Baltazar Najjar, NP states she is going to call pt's wife back to tell her pt has transferred to ICU.  Report given to [place], RN on [place]. Pt transferred to 3M01 by Waunita Schooner, rapid response RN and Ronalee Belts, CN with belongings.

## 2019-05-23 NOTE — Progress Notes (Signed)
Writer initially arrived @ bedside for PIV placement ~0245. Patient being cleaned by staff. Instructed would return. Writer arrived back to patient room @ 0345. Patient transferred to [facility]. Flowsheet reviewed. PIV placed by J. Thacker RN x2. Notified 13M RN. Additional access not needed @ this time. RN verbalized consult will be placed if additional access is needed. Consult discontinued.

## 2019-05-24 DIAGNOSIS — K5791 Diverticulosis of intestine, part unspecified, without perforation or abscess with bleeding: Secondary | ICD-10-CM

## 2019-05-24 DIAGNOSIS — R578 Other shock: Secondary | ICD-10-CM

## 2019-05-24 LAB — CBC
HCT: 26.1 % — ABNORMAL LOW (ref 39.0–52.0)
Hemoglobin: 8.7 g/dL — ABNORMAL LOW (ref 13.0–17.0)
MCH: 30.7 pg (ref 26.0–34.0)
MCHC: 33.3 g/dL (ref 30.0–36.0)
MCV: 92.2 fL (ref 80.0–100.0)
Platelets: 151 10*3/uL (ref 150–400)
RBC: 2.83 MIL/uL — ABNORMAL LOW (ref 4.22–5.81)
RDW: 15 % (ref 11.5–15.5)
WBC: 32.8 10*3/uL — ABNORMAL HIGH (ref 4.0–10.5)
nRBC: 0 % (ref 0.0–0.2)

## 2019-05-24 LAB — TYPE AND SCREEN
ABO/RH(D): O POS
Antibody Screen: NEGATIVE
Unit division: 0
Unit division: 0
Unit division: 0
Unit division: 0

## 2019-05-24 LAB — BPAM FFP
Blood Product Expiration Date: 202012172359
Blood Product Expiration Date: 202012172359
Blood Product Expiration Date: 202012222359
Blood Product Expiration Date: 202012222359
ISSUE DATE / TIME: 202012170724
ISSUE DATE / TIME: 202012170756
ISSUE DATE / TIME: 202012171103
ISSUE DATE / TIME: 202012171240
Unit Type and Rh: 5100
Unit Type and Rh: 6200
Unit Type and Rh: 6200
Unit Type and Rh: 9500

## 2019-05-24 LAB — BPAM RBC
Blood Product Expiration Date: 202101112359
Blood Product Expiration Date: 202101122359
Blood Product Expiration Date: 202101162359
Blood Product Expiration Date: 202101162359
ISSUE DATE / TIME: 202012170419
ISSUE DATE / TIME: 202012170419
ISSUE DATE / TIME: 202012170731
ISSUE DATE / TIME: 202012170731
Unit Type and Rh: 5100
Unit Type and Rh: 5100
Unit Type and Rh: 5100
Unit Type and Rh: 5100

## 2019-05-24 LAB — COMPREHENSIVE METABOLIC PANEL
ALT: 12 U/L (ref 0–44)
AST: 19 U/L (ref 15–41)
Albumin: 1.8 g/dL — ABNORMAL LOW (ref 3.5–5.0)
Alkaline Phosphatase: 40 U/L (ref 38–126)
Anion gap: 6 (ref 5–15)
BUN: 38 mg/dL — ABNORMAL HIGH (ref 8–23)
CO2: 21 mmol/L — ABNORMAL LOW (ref 22–32)
Calcium: 7 mg/dL — ABNORMAL LOW (ref 8.9–10.3)
Chloride: 114 mmol/L — ABNORMAL HIGH (ref 98–111)
Creatinine, Ser: 1.29 mg/dL — ABNORMAL HIGH (ref 0.61–1.24)
GFR calc Af Amer: >60 mL/min (ref >60)
GFR calc non Af Amer: 54 mL/min — ABNORMAL LOW (ref >60)
Glucose, Bld: 192 mg/dL — ABNORMAL HIGH (ref 70–99)
Potassium: 4.5 mmol/L (ref 3.5–5.1)
Sodium: 141 mmol/L (ref 135–145)
Total Bilirubin: 1 mg/dL (ref 0.3–1.2)
Total Protein: 3.7 g/dL — ABNORMAL LOW (ref 6.5–8.1)

## 2019-05-24 LAB — PREPARE FRESH FROZEN PLASMA
Unit division: 0
Unit division: 0
Unit division: 0
Unit division: 0

## 2019-05-24 LAB — D-DIMER, QUANTITATIVE: D-Dimer, Quant: 6.38 ug/mL-FEU — ABNORMAL HIGH (ref 0.00–0.50)

## 2019-05-24 LAB — GLUCOSE, CAPILLARY
Glucose-Capillary: 130 mg/dL — ABNORMAL HIGH (ref 70–99)
Glucose-Capillary: 141 mg/dL — ABNORMAL HIGH (ref 70–99)
Glucose-Capillary: 149 mg/dL — ABNORMAL HIGH (ref 70–99)
Glucose-Capillary: 169 mg/dL — ABNORMAL HIGH (ref 70–99)
Glucose-Capillary: 175 mg/dL — ABNORMAL HIGH (ref 70–99)
Glucose-Capillary: 177 mg/dL — ABNORMAL HIGH (ref 70–99)
Glucose-Capillary: 189 mg/dL — ABNORMAL HIGH (ref 70–99)

## 2019-05-24 LAB — MAGNESIUM: Magnesium: 1.9 mg/dL (ref 1.7–2.4)

## 2019-05-24 LAB — LACTATE DEHYDROGENASE: LDH: 213 U/L — ABNORMAL HIGH (ref 98–192)

## 2019-05-24 LAB — FIBRINOGEN: Fibrinogen: 176 mg/dL — ABNORMAL LOW (ref 210–475)

## 2019-05-24 LAB — C-REACTIVE PROTEIN: CRP: 0.6 mg/dL (ref <1.0)

## 2019-05-24 LAB — FERRITIN: Ferritin: 453 ng/mL — ABNORMAL HIGH (ref 24–336)

## 2019-05-24 LAB — PHOSPHORUS: Phosphorus: 4.2 mg/dL (ref 2.5–4.6)

## 2019-05-24 MED ORDER — PSYLLIUM 95 % PO PACK
1.0000 | PACK | Freq: Two times a day (BID) | ORAL | Status: DC
  Administered 2019-05-24 – 2019-05-29 (×10): 1 via ORAL
  Filled 2019-05-24 (×13): qty 1

## 2019-05-24 MED ORDER — CHLORHEXIDINE GLUCONATE CLOTH 2 % EX PADS
6.0000 | MEDICATED_PAD | Freq: Every day | CUTANEOUS | Status: DC
  Administered 2019-05-24 – 2019-05-28 (×5): 6 via TOPICAL

## 2019-05-24 MED ORDER — PANTOPRAZOLE SODIUM 40 MG IV SOLR
40.0000 mg | Freq: Two times a day (BID) | INTRAVENOUS | Status: DC
  Administered 2019-05-24 – 2019-05-25 (×3): 40 mg via INTRAVENOUS
  Filled 2019-05-24 (×3): qty 40

## 2019-05-24 NOTE — Progress Notes (Signed)
Patient trasfered from 26M to [phone] via wheelchair; alert and oriented x 4; no complaints of pain; IV saline locked in LFA and NS running @ 125 ml/hr. Orient patient to room and unit; watch; instructed how to use the call bell and  fall risk precautions.  -Patient was on 2L and had to increased it to 4L due to desaturation, no complain of SOB - Instructed to do Deep breathing exercises, no further intervenyions at this moment

## 2019-05-24 NOTE — Progress Notes (Signed)
Physical Therapy Treatment Patient Details Name: [name] MRN: [id number] DOB: 1946/03/19 Today's Date: 05/24/2019    History of Present Illness 73 y.o. male with past medical history significant for CLL, CAD, BPH and HFrEF who was apparently not feeling very well for the past 4 to 5 days.   Patient was noted to have bilateral lower lobe pneumonias and significant hypoxia with 75% O2 sats on room air.  Patient was given nasal cannula 4 L oxygen with improvement to O2 sats in the 90s with decreased work of breathing.   Covid test subsequently came back positive. admitted for Covid versus community-acquired pneumonia. Pt with GI bleed 12/16.     PT Comments    Pt making good progress. Very motivated to participate with therapy.    Follow Up Recommendations  SNF     Equipment Recommendations  Rolling walker with 5" wheels    Recommendations for Other Services       Precautions / Restrictions Precautions Precautions: Fall Precaution Comments: monitor sats Restrictions Weight Bearing Restrictions: No    Mobility  Bed Mobility Overal bed mobility: Needs Assistance Bed Mobility: Sit to Supine       Sit to supine: Min guard   General bed mobility comments: Assist for lines  Transfers Overall transfer level: Needs assistance Equipment used: Rolling walker (2 wheeled) Transfers: Sit to/from Stand Sit to Stand: Min guard         General transfer comment: Assist for lines/safety  Ambulation/Gait Ambulation/Gait assistance: Min assist Gait Distance (Feet): 75 Feet Assistive device: Rolling walker (2 wheeled) Gait Pattern/deviations: Step-through pattern;Decreased step length - right;Decreased step length - left;Shuffle;Trunk flexed Gait velocity: decr Gait velocity interpretation: <1.31 ft/sec, indicative of household ambulator General Gait Details: Assist for balance and support. Amb on 4L with SpO2 86-90%   Stairs             Wheelchair Mobility     Modified Rankin (Stroke Patients Only)       Balance Overall balance assessment: Needs assistance Sitting-balance support: Feet supported Sitting balance-Leahy Scale: Good     Standing balance support: Bilateral upper extremity supported;During functional activity Standing balance-Leahy Scale: Poor Standing balance comment: walker and supervision or no UE support and min assist for static standinb                            Cognition Arousal/Alertness: Awake/alert Behavior During Therapy: WFL for tasks assessed/performed Overall Cognitive Status: Impaired/Different from baseline Area of Impairment: Memory;Following commands;Safety/judgement;Problem solving;Attention                   Current Attention Level: Sustained Memory: Decreased short-term memory Following Commands: Follows one step commands consistently     Problem Solving: Requires verbal cues;Slow processing        Exercises      General Comments        Pertinent Vitals/Pain Pain Assessment: No/denies pain    Home Living                      Prior Function            PT Goals (current goals can now be found in the care plan section) Progress towards PT goals: Progressing toward goals    Frequency    Min 2X/week      PT Plan Current plan remains appropriate    Co-evaluation  AM-PAC PT "6 Clicks" Mobility   Outcome Measure  Help needed turning from your back to your side while in a flat bed without using bedrails?: None Help needed moving from lying on your back to sitting on the side of a flat bed without using bedrails?: A Little Help needed moving to and from a bed to a chair (including a wheelchair)?: A Little Help needed standing up from a chair using your arms (e.g., wheelchair or bedside chair)?: A Little Help needed to walk in hospital room?: A Little Help needed climbing 3-5 steps with a railing? : Total 6 Click Score: 17    End  of Session Equipment Utilized During Treatment: Gait belt;Oxygen Activity Tolerance: Patient tolerated treatment well Patient left: in bed;with bed alarm set;with call bell/phone within reach Nurse Communication: Mobility status PT Visit Diagnosis: Unsteadiness on feet (R26.81);Other abnormalities of gait and mobility (R26.89);Muscle weakness (generalized) (M62.81);Difficulty in walking, not elsewhere classified (R26.2)     Time: 1430-1505 PT Time Calculation (min) (ACUTE ONLY): 35 min  Charges:  $Gait Training: 23-37 mins                     [place] Pager [phone] Office [place] 05/24/2019, 4:46 PM

## 2019-05-24 NOTE — Progress Notes (Signed)
Subjective: The patient was seen and examined at bedside in presence of his nurse. He has not had any more rectal bleeding since 11 AM yesterday. IV pressors were discontinued at 7 PM yesterday. He denies nausea or abdominal pain.  Objective: Vital signs in last 24 hours: Temp:  [97 F (36.1 C)-98.2 F (36.8 C)] 97.7 F (36.5 C) (12/18 1127) Pulse Rate:  [58-100] 58 (12/18 1300) Resp:  [13-29] 23 (12/18 1300) BP: (107-151)/(57-97) 135/64 (12/18 1300) SpO2:  [87 %-100 %] 95 % (12/18 1300) Weight:  [65 kg] 65 kg (12/18 0435) Weight change: -0.2 kg Last BM Date: 05/23/19  PE: Elderly gentleman, lying comfortably on bed, mild pallor GENERAL: On oxygen via nasal cannula at 2 L/min saturating 95%, able to speak in full sentences ABDOMEN: Soft, nondistended, nontender EXTREMITIES: No deformity  Lab Results: Results for orders placed or performed during the hospital encounter of 05/15/19 (from the past 48 hour(s))  Occult blood card to lab, stool     Status: Abnormal   Collection Time: 05/23/19 12:57 AM  Result Value Ref Range   Fecal Occult Bld POSITIVE (A) NEGATIVE    Comment: Performed at [facility], [address], [place], [place] [address]  Comprehensive metabolic panel     Status: Abnormal   Collection Time: 05/23/19  1:56 AM  Result Value Ref Range   Sodium 136 135 - 145 mmol/L   Potassium 4.9 3.5 - 5.1 mmol/L   Chloride 104 98 - 111 mmol/L   CO2 22 22 - 32 mmol/L   Glucose, Bld 279 (H) 70 - 99 mg/dL   BUN 40 (H) 8 - 23 mg/dL   Creatinine, Ser 1.50 (H) 0.61 - 1.24 mg/dL   Calcium 7.7 (L) 8.9 - 10.3 mg/dL   Total Protein 4.5 (L) 6.5 - 8.1 g/dL   Albumin 2.0 (L) 3.5 - 5.0 g/dL   AST 22 15 - 41 U/L   ALT 14 0 - 44 U/L   Alkaline Phosphatase 57 38 - 126 U/L   Total Bilirubin 0.8 0.3 - 1.2 mg/dL   GFR calc non Af Amer 45 (L) >60 mL/min   GFR calc Af Amer 52 (L) >60 mL/min   Anion gap 10 5 - 15    Comment: Performed at [facility], [place] [address],  [place], [place] [address]  D-dimer, quantitative (not at [facility])     Status: Abnormal   Collection Time: 05/23/19  1:56 AM  Result Value Ref Range   D-Dimer, Quant 7.66 (H) 0.00 - 0.50 ug/mL-FEU    Comment: (NOTE) At the manufacturer cut-off of 0.50 ug/mL FEU, this assay has been documented to exclude PE with a sensitivity and negative predictive value of 97 to 99%.  At this time, this assay has not been approved by the FDA to exclude DVT/VTE. Results should be correlated with clinical presentation. Performed at [facility], [place] [address], , [place] [address]   Fibrinogen     Status: None   Collection Time: 05/23/19  1:56 AM  Result Value Ref Range   Fibrinogen 346 210 - 475 mg/dL    Comment: Performed at [facility] [address] [place], [place] [address]  Lactate dehydrogenase     Status: Abnormal   Collection Time: 05/23/19  1:56 AM  Result Value Ref Range   LDH 251 (H) 98 - 192 U/L    Comment: Performed at [facility], [place] [address], [place], [place]  [id number]  C-reactive protein     Status: Abnormal   Collection Time: 05/23/19  1:56 AM  Result Value Ref Range   CRP 1.0 (H) <1.0 mg/dL    Comment: Performed at [facility] [address] [place], [place] [address]  Ferritin     Status: Abnormal   Collection Time: 05/23/19  1:56 AM  Result Value Ref Range   Ferritin 621 (H) 24 - 336 ng/mL    Comment: Performed at [facility], [place] [address], [place], [place] [address]  CBC     Status: Abnormal   Collection Time: 05/23/19  1:56 AM  Result Value Ref Range   WBC 50.1 (HH) 4.0 - 10.5 K/uL    Comment: REPEATED TO VERIFY WHITE COUNT CONFIRMED ON SMEAR THIS CRITICAL RESULT HAS VERIFIED AND BEEN CALLED TO M.OVERBY,RN BY MELISSA BROGDON ON 12 17 2020 AT 0311, AND HAS BEEN READ BACK.     RBC 3.46 (L) 4.22 - 5.81 MIL/uL   Hemoglobin 10.3 (L) 13.0 - 17.0 g/dL   HCT 31.9 (L) 39.0 - 52.0 %   MCV 92.2 80.0 - 100.0 fL   MCH 29.8 26.0 - 34.0 pg    MCHC 32.3 30.0 - 36.0 g/dL   RDW 14.0 11.5 - 15.5 %   Platelets 299 150 - 400 K/uL   nRBC 0.0 0.0 - 0.2 %    Comment: Performed at [facility] [address] [place], [place] [address]  Type and screen [place]     Status: None   Collection Time: 05/23/19  1:56 AM  Result Value Ref Range   ABO/RH(D) O POS    Antibody Screen NEG    Sample Expiration 05/26/2019,2359    Unit Number [id number]    Blood Component Type RED CELLS,LR    Unit division 00    Status of Unit ISSUED,FINAL    Transfusion Status OK TO TRANSFUSE    Crossmatch Result      Compatible Performed at [facility], [place] [address], [place], [place] [address]    Unit Number [id number]    Blood Component Type RED CELLS,LR    Unit division 00    Status of Unit ISSUED,FINAL    Transfusion Status OK TO TRANSFUSE    Crossmatch Result Compatible    Unit Number [id number]    Blood Component Type RED CELLS,LR    Unit division 00    Status of Unit ISSUED,FINAL    Transfusion Status OK TO TRANSFUSE    Crossmatch Result Compatible    Unit Number [id number]    Blood Component Type RBC LR PHER2    Unit division 00    Status of Unit ISSUED,FINAL    Transfusion Status OK TO TRANSFUSE    Crossmatch Result Compatible   Protime-INR     Status: Abnormal   Collection Time: 05/23/19  1:56 AM  Result Value Ref Range   Prothrombin Time 17.3 (H) 11.4 - 15.2 seconds   INR 1.4 (H) 0.8 - 1.2    Comment: (NOTE) INR goal varies based on device and disease states. Performed at [facility], [place] [address], [place], [place] [address]   ABO/Rh     Status: None   Collection Time: 05/23/19  1:56 AM  Result Value Ref Range   ABO/RH(D)      O POS Performed at [facility] [address], [place], [place] [address]   Prepare RBC  Status: None   Collection Time: 05/23/19  2:00 AM  Result Value Ref Range   Order Confirmation      ORDER PROCESSED BY BLOOD BANK Performed at  [facility], [place] [address], [place], [place] [address]   Prepare RBC     Status: None   Collection Time: 05/23/19  5:12 AM  Result Value Ref Range   Order Confirmation      ORDER PROCESSED BY BLOOD BANK Performed at [facility], [place] [address], [place], [place] [address]   Prepare fresh frozen plasma     Status: None   Collection Time: 05/23/19  5:30 AM  Result Value Ref Range   Unit Number [id number]    Blood Component Type THAWED PLASMA    Unit division 00    Status of Unit REL FROM [facility]    Transfusion Status OK TO TRANSFUSE    Unit Number [id number]    Blood Component Type THAWED PLASMA    Unit division 00    Status of Unit REL FROM [facility]    Transfusion Status OK TO TRANSFUSE    Unit Number [id number]    Blood Component Type THAWED PLASMA    Unit division 00    Status of Unit ISSUED,FINAL    Transfusion Status OK TO TRANSFUSE    Unit Number [id number]    Blood Component Type THAWED PLASMA    Unit division 00    Status of Unit ISSUED,FINAL    Transfusion Status      OK TO TRANSFUSE Performed at [facility], [place] [address], [place], [place] [address]   CBC     Status: Abnormal   Collection Time: 05/23/19  6:25 AM  Result Value Ref Range   WBC 48.5 (H) 4.0 - 10.5 K/uL   RBC 3.07 (L) 4.22 - 5.81 MIL/uL   Hemoglobin 9.0 (L) 13.0 - 17.0 g/dL   HCT 28.7 (L) 39.0 - 52.0 %   MCV 93.5 80.0 - 100.0 fL   MCH 29.3 26.0 - 34.0 pg   MCHC 31.4 30.0 - 36.0 g/dL   RDW 14.3 11.5 - 15.5 %   Platelets 246 150 - 400 K/uL   nRBC 0.0 0.0 - 0.2 %    Comment: Performed at [facility], [place] [address], [place],  [address]  Hemoglobin A1c     Status: Abnormal   Collection Time: 05/23/19  6:25 AM  Result Value Ref Range   Hgb A1c MFr Bld 6.8 (H) 4.8 - 5.6 %    Comment: (NOTE) Pre diabetes:          5.7%-6.4% Diabetes:              >6.4% Glycemic control for   <7.0% adults with diabetes    Mean Plasma Glucose 148.46 mg/dL    Comment:  Performed at [facility] [address], [place], [place] [address]  Glucose, capillary     Status: Abnormal   Collection Time: 05/23/19 11:27 AM  Result Value Ref Range   Glucose-Capillary 181 (H) 70 - 99 mg/dL  Glucose, capillary     Status: Abnormal   Collection Time: 05/23/19  3:19 PM  Result Value Ref Range   Glucose-Capillary 152 (H) 70 - 99 mg/dL  CBC     Status: Abnormal   Collection Time: 05/23/19  3:42 PM  Result Value Ref Range   WBC 44.5 (H) 4.0 - 10.5 K/uL   RBC 3.44 (L) 4.22 -  5.81 MIL/uL   Hemoglobin 10.4 (L) 13.0 - 17.0 g/dL   HCT 30.8 (L) 39.0 - 52.0 %   MCV 89.5 80.0 - 100.0 fL   MCH 30.2 26.0 - 34.0 pg   MCHC 33.8 30.0 - 36.0 g/dL   RDW 14.6 11.5 - 15.5 %   Platelets 175 150 - 400 K/uL   nRBC 0.0 0.0 - 0.2 %    Comment: Performed at [facility] [address] [place], [place] [address]  Glucose, capillary     Status: Abnormal   Collection Time: 05/23/19  8:13 PM  Result Value Ref Range   Glucose-Capillary 156 (H) 70 - 99 mg/dL  Glucose, capillary     Status: Abnormal   Collection Time: 05/24/19 12:05 AM  Result Value Ref Range   Glucose-Capillary 175 (H) 70 - 99 mg/dL  Comprehensive metabolic panel     Status: Abnormal   Collection Time: 05/24/19  3:38 AM  Result Value Ref Range   Sodium 141 135 - 145 mmol/L   Potassium 4.5 3.5 - 5.1 mmol/L   Chloride 114 (H) 98 - 111 mmol/L   CO2 21 (L) 22 - 32 mmol/L   Glucose, Bld 192 (H) 70 - 99 mg/dL   BUN 38 (H) 8 - 23 mg/dL   Creatinine, Ser 1.29 (H) 0.61 - 1.24 mg/dL   Calcium 7.0 (L) 8.9 - 10.3 mg/dL   Total Protein 3.7 (L) 6.5 - 8.1 g/dL   Albumin 1.8 (L) 3.5 - 5.0 g/dL   AST 19 15 - 41 U/L   ALT 12 0 - 44 U/L   Alkaline Phosphatase 40 38 - 126 U/L   Total Bilirubin 1.0 0.3 - 1.2 mg/dL   GFR calc non Af Amer 54 (L) >60 mL/min   GFR calc Af Amer >60 >60 mL/min   Anion gap 6 5 - 15    Comment: Performed at [facility], [place] [address], [place], [place] [address]  D-dimer,  quantitative (not at [facility])     Status: Abnormal   Collection Time: 05/24/19  3:38 AM  Result Value Ref Range   D-Dimer, Quant 6.38 (H) 0.00 - 0.50 ug/mL-FEU    Comment: (NOTE) At the manufacturer cut-off of 0.50 ug/mL FEU, this assay has been documented to exclude PE with a sensitivity and negative predictive value of 97 to 99%.  At this time, this assay has not been approved by the FDA to exclude DVT/VTE. Results should be correlated with clinical presentation. Performed at [facility], [place] [address] [place], [place] [address]   Fibrinogen     Status: Abnormal   Collection Time: 05/24/19  3:38 AM  Result Value Ref Range   Fibrinogen 176 (L) 210 - 475 mg/dL    Comment: Performed at [facility] [address] [place], [place] [address]  Lactate dehydrogenase     Status: Abnormal   Collection Time: 05/24/19  3:38 AM  Result Value Ref Range   LDH 213 (H) 98 - 192 U/L    Comment: Performed at [facility], [place] [address], [place], [place] [address]  C-reactive protein     Status: None   Collection Time: 05/24/19  3:38 AM  Result Value Ref Range   CRP 0.6 <1.0 mg/dL    Comment: Performed at [facility], [place] [address] [place], [place] [address]  Ferritin     Status: Abnormal   Collection Time: 05/24/19  3:38 AM  Result Value Ref Range   Ferritin 453 (H) 24 - 336 ng/mL    Comment: Performed at [facility], [place] [address], [place], [place] [address]  CBC AM     Status: Abnormal   Collection Time: 05/24/19  3:38 AM  Result Value Ref Range   WBC 32.8 (H) 4.0 - 10.5 K/uL   RBC 2.83 (L) 4.22 - 5.81 MIL/uL   Hemoglobin 8.7 (L) 13.0 - 17.0 g/dL   HCT 26.1 (L) 39.0 - 52.0 %   MCV 92.2 80.0 - 100.0 fL   MCH 30.7 26.0 - 34.0 pg   MCHC 33.3 30.0 - 36.0 g/dL   RDW 15.0 11.5 - 15.5 %   Platelets 151 150 - 400 K/uL   nRBC 0.0 0.0 - 0.2 %    Comment: Performed at [facility], [place] [address], [place], [place] [address]  MAG AM     Status: None    Collection Time: 05/24/19  3:38 AM  Result Value Ref Range   Magnesium 1.9 1.7 - 2.4 mg/dL    Comment: Performed at [facility], [place] [address] [place], [place] [address]  Phosphorus AM     Status: None   Collection Time: 05/24/19  3:38 AM  Result Value Ref Range   Phosphorus 4.2 2.5 - 4.6 mg/dL    Comment: Performed at [facility], [place] [address] [place], [place] [address]  Glucose, capillary     Status: Abnormal   Collection Time: 05/24/19  8:44 AM  Result Value Ref Range   Glucose-Capillary 177 (H) 70 - 99 mg/dL  Glucose, capillary     Status: Abnormal   Collection Time: 05/24/19 11:33 AM  Result Value Ref Range   Glucose-Capillary 141 (H) 70 - 99 mg/dL    Studies/Results: NM GI Blood Loss  Result Date: 05/23/2019 CLINICAL DATA:  Hematochezia and hypotension earlier this morning. EXAM: NUCLEAR MEDICINE GASTROINTESTINAL BLEEDING SCAN TECHNIQUE: Sequential abdominal images were obtained following intravenous administration of Tc-60m labeled red blood cells. RADIOPHARMACEUTICALS:  26.5 mCi Tc-49m pertechnetate in-vitro labeled red cells. COMPARISON:  None. FINDINGS: No abnormal focus of activity is demonstrated in the bowel to suggest a gastrointestinal bleed. Normal physiologic biodistribution of the radiotracer is demonstrated throughout the abdomen. IMPRESSION: Negative. Electronically Signed   By: Titus Dubin M.D.   On: 05/23/2019 19:28   DG CHEST PORT 1 VIEW  Result Date: 05/23/2019 CLINICAL DATA:  Central line placement.  COVID-19 positive. EXAM: PORTABLE CHEST 1 VIEW COMPARISON:  Radiograph and CT 05/19/2019 FINDINGS: Right internal jugular central venous catheter tip at the atrial caval junction. No pneumothorax. Bilateral heterogeneous airspace opacities with improvement on the left from prior exam. Right lung opacities are not significantly changed. Unchanged heart size and mediastinal contours. No large pleural effusion. IMPRESSION: 1. Right internal jugular  central venous catheter tip at the atrial caval junction. No pneumothorax. 2. Bilateral pneumonia with improvement on the left from prior exam. Electronically Signed   By: Keith Rake M.D.   On: 05/23/2019 06:58    Medications: I have reviewed the patient's current medications.  Assessment: Painless hematochezia suspicious for diverticular bleeding Patient had diverticulosis noted on colonoscopy from 2019 Bleeding scan negative for active bleeding  Hemoglobin between 9/10.4 and 8.7 No further evidence of rectal bleeding Hemodynamically stable    COVID-19 positive test  with Acute Pneumonia    History of CLL   Plan: Has been started on heart healthy diet, continued on IV  fluids at 125 mL/h, which may cause hemodilution all drop in blood count. IV octreotide has been discontinued. We will start patient on Metamucil twice daily. Monitor daily CBC, with plans to transfuse if hemoglobin is less than 7. Will DC IV Protonix drip, and continue patient on PPI twice daily.   Ronnette Juniper, MD 05/24/2019, 1:33 PM

## 2019-05-24 NOTE — Progress Notes (Signed)
Report called to 5W RN

## 2019-05-24 NOTE — Progress Notes (Signed)
NAME:  [name], MRN:  [id number], DOB:  [birth year], LOS: 9 ADMISSION DATE:  05/15/2019, CONSULTATION DATE:  05/23/19 REFERRING MD:  NP Kirby-Graham , CHIEF COMPLAINT:  GI bleeding  Brief History    [name] is a 73 year old with hx of CLL, CAD, BPH, GERD, esophageal dyspnagia, HTN, HFrEF admitted 12/9 with hypoxemic resp failure 2/2 covid pna..   History of present illness   Admitted 12/9 with hypoxemic respiratory failure.  This evening developed hematochezia, multiple episodes, with episodes of hypotension.  Also has worsening oxygen failure.   He is not able to provide much history.  Answers "yes I think so to most questions" cannot provide further details.   Unclear what his baseline mental status is.  Past Medical History   CLL, CAD, BPH, GERD, esophageal dyspnagia, HTN, HFrEF  Significant Hospital Events     Consults:  GI consulted by Internal Medicine NP.   Procedures:    Significant Diagnostic Tests:  CXR 2. Bilateral pneumonia with improvement on the left from prior exam. Micro Data:    Antimicrobials:    Interim history/subjective:  Transfer to the ICU for hypoxemic respiratory failure and pressor need  Objective   Blood pressure 131/61, pulse 97, temperature 97.9 F (36.6 C), temperature source Oral, resp. rate (!) 29, height 5\' 10"  (1.778 m), weight 65 kg, SpO2 (!) 88 %.        Intake/Output Summary (Last 24 hours) at 05/24/2019 0945 Last data filed at 05/24/2019 0900 Gross per 24 hour  Intake 5616.57 ml  Output 1325 ml  Net 4291.57 ml   Filed Weights   05/22/19 0445 05/23/19 0351 05/24/19 0435  Weight: 67.3 kg 65.2 kg 65 kg    Examination: General: Acutely ill appearing, NAD HENT: [place]/AT, PERRL, EOM-I and MMM Lungs: CTA bilaterally Cardiovascular: RRR, Nl S1/S2 and -M/R/G Abdomen: Soft, NT, ND and +BS Extremities: -edema and -tenderness Neuro: alert, not able to give details of history   I reviewed CXR myself, no acute disease  noted  Resolved Hospital Problem list     Assessment & Plan:  Acute GI bleed - hematochezia, no vomiting.  Anemia acute blood loss.  Blood transfusion pending (4 units pending). FFP pending (2 units) GI consulted, bleeding scan is negative, will defer to them on interventions as the Hg continues to drop D/C octreotide Continue protonix H&H q6 Transfuse for less than 7 Attempt to get off pressors today and if stable in the afternoon will transfer to 5W and to [facility] with PCCM off 12/19  Continue solumedrol to 60 BID  Acute on chronic renal failure. BMET in AM Replace electrolytes as indicated IVF as ordered  Covid PNA: on solumedrol, increased dose 3 days ago.  Completed remdesivir.   Now req higher doses oxygen, no resp distress.  Continue HFNC, no need for HHF Titrate O2 for sat of 85-88% as long as WOB is acceptable No diuretics Transition to regular [place] from [place] practice:  Diet: npo Pain/Anxiety/Delirium protocol (if indicated):  VAP protocol (if indicated):  DVT prophylaxis: hold lovenox GI prophylaxis:  Glucose control: ISS Mobility: bed rest Code Status: Full code Family Communication:  Disposition: ICU  Labs   CBC: Recent Labs  Lab 05/18/19 0417 05/19/19 0352 05/20/19 0500 05/22/19 0358 05/23/19 0156 05/23/19 0625 05/23/19 1542 05/24/19 0338  WBC 45.2* 43.8* 42.6* 44.8* 50.1* 48.5* 44.5* 32.8*  NEUTROABS 8.0* 6.5 5.5 5.3  --   --   --   --  HGB 12.2* 11.7* 12.9* 11.6* 10.3* 9.0* 10.4* 8.7*  HCT 39.0 36.7* 40.7 35.3* 31.9* 28.7* 30.8* 26.1*  MCV 94.0 94.1 92.9 90.5 92.2 93.5 89.5 92.2  PLT 186 205 232 284 299 246 175 [id number]    Basic Metabolic Panel: Recent Labs  Lab 05/18/19 0417 05/19/19 0352 05/20/19 0500 05/22/19 0358 05/23/19 0156 05/24/19 0338  NA 139 139 139 137 136 141  K 4.6 4.7 4.9 5.1 4.9 4.5  CL 110 110 109 105 104 114*  CO2 20* 20* 19* 22 22 21*  GLUCOSE 170* 171* 171* 218* 279* 192*  BUN 32* 32* 38* 36* 40* 38*    CREATININE 1.23 1.19 1.52* 1.31* 1.50* 1.29*  CALCIUM 8.1* 8.0* 8.3* 8.0* 7.7* 7.0*  MG 2.0 2.1 2.2  --   --  1.9  PHOS 3.4 3.3 4.3  --   --  4.2   GFR: Estimated Creatinine Clearance: 45.5 mL/min (A) (by C-G formula based on SCr of 1.29 mg/dL (H)). Recent Labs  Lab 05/19/19 0352 05/23/19 0156 05/23/19 0625 05/23/19 1542 05/24/19 0338  PROCALCITON 0.27  --   --   --   --   WBC 43.8* 50.1* 48.5* 44.5* 32.8*    Liver Function Tests: Recent Labs  Lab 05/19/19 0352 05/20/19 0500 05/22/19 0358 05/23/19 0156 05/24/19 0338  AST 40 34 23 22 19   ALT 20 20 15 14 12   ALKPHOS 50 53 52 57 40  BILITOT 0.9 0.8 1.0 0.8 1.0  PROT 4.9* 5.3* 4.9* 4.5* 3.7*  ALBUMIN 2.1* 2.3* 2.1* 2.0* 1.8*   No results for input(s): LIPASE, AMYLASE in the last 168 hours. No results for input(s): AMMONIA in the last 168 hours.  ABG    Component Value Date/Time   PHART 7.475 (H) 05/19/2019 0549   PCO2ART 28.0 (L) 05/19/2019 0549   PO2ART 79.6 (L) 05/19/2019 0549   HCO3 20.4 05/19/2019 0549   TCO2 22 06/16/2015 1412   ACIDBASEDEF 2.7 (H) 05/19/2019 0549   O2SAT 95.9 05/19/2019 0549     Coagulation Profile: Recent Labs  Lab 05/23/19 0156  INR 1.4*    Cardiac Enzymes: No results for input(s): CKTOTAL, CKMB, CKMBINDEX, TROPONINI in the last 168 hours.  HbA1C: Hgb A1c MFr Bld  Date/Time Value Ref Range Status  05/23/2019 06:25 AM 6.8 (H) 4.8 - 5.6 % Final    Comment:    (NOTE) Pre diabetes:          5.7%-6.4% Diabetes:              >6.4% Glycemic control for   <7.0% adults with diabetes   11/23/2012 07:44 AM 5.7 (H) <5.7 % Final    Comment:    (NOTE)                                                                       According to the ADA Clinical Practice Recommendations for 2011, when HbA1c is used as a screening test:  >=6.5%   Diagnostic of Diabetes Mellitus           (if abnormal result is confirmed) 5.7-6.4%   Increased risk of developing Diabetes  [place] References:Diagnosis and Classification of Diabetes Mellitus,Diabetes [id number] 1):S62-S69 and Standards of Medical Care in  Diabetes - 2011,Diabetes Care,2011,34 (Suppl 1):S11-S61.    CBG: Recent Labs  Lab 05/23/19 1127 05/23/19 1519 05/23/19 2013 05/24/19 0005 05/24/19 0844  GLUCAP 181* 152* 156* 175* 177*   The patient is critically ill with multiple organ systems failure and requires high complexity decision making for assessment and support, frequent evaluation and titration of therapies, application of advanced monitoring technologies and extensive interpretation of multiple databases.   Critical Care Time devoted to patient care services described in this note is  32  Minutes. This time reflects time of care of this signee Dr Jennet Maduro. This critical care time does not reflect procedure time, or teaching time or supervisory time of PA/NP/Med student/Med Resident etc but could involve care discussion time.  Rush Farmer, M.D. [facility] Pulmonary/Critical Care Medicine.

## 2019-05-25 LAB — BASIC METABOLIC PANEL
Anion gap: 6 (ref 5–15)
BUN: 35 mg/dL — ABNORMAL HIGH (ref 8–23)
CO2: 20 mmol/L — ABNORMAL LOW (ref 22–32)
Calcium: 7.4 mg/dL — ABNORMAL LOW (ref 8.9–10.3)
Chloride: 116 mmol/L — ABNORMAL HIGH (ref 98–111)
Creatinine, Ser: 1.3 mg/dL — ABNORMAL HIGH (ref 0.61–1.24)
GFR calc Af Amer: >60 mL/min (ref >60)
GFR calc non Af Amer: 53 mL/min — ABNORMAL LOW (ref >60)
Glucose, Bld: 152 mg/dL — ABNORMAL HIGH (ref 70–99)
Potassium: 4.6 mmol/L (ref 3.5–5.1)
Sodium: 142 mmol/L (ref 135–145)

## 2019-05-25 LAB — GLUCOSE, CAPILLARY
Glucose-Capillary: 144 mg/dL — ABNORMAL HIGH (ref 70–99)
Glucose-Capillary: 155 mg/dL — ABNORMAL HIGH (ref 70–99)
Glucose-Capillary: 154 mg/dL — ABNORMAL HIGH (ref 70–99)
Glucose-Capillary: 119 mg/dL — ABNORMAL HIGH (ref 70–99)

## 2019-05-25 LAB — CBC
HCT: 29.1 % — ABNORMAL LOW (ref 39.0–52.0)
Hemoglobin: 9.3 g/dL — ABNORMAL LOW (ref 13.0–17.0)
MCH: 30.1 pg (ref 26.0–34.0)
MCHC: 32 g/dL (ref 30.0–36.0)
MCV: 94.2 fL (ref 80.0–100.0)
Platelets: 178 10*3/uL (ref 150–400)
RBC: 3.09 MIL/uL — ABNORMAL LOW (ref 4.22–5.81)
RDW: 15 % (ref 11.5–15.5)
WBC: 41.1 10*3/uL — ABNORMAL HIGH (ref 4.0–10.5)
nRBC: 0 % (ref 0.0–0.2)

## 2019-05-25 LAB — C-REACTIVE PROTEIN: CRP: 0.6 mg/dL (ref <1.0)

## 2019-05-25 LAB — FIBRINOGEN: Fibrinogen: 183 mg/dL — ABNORMAL LOW (ref 210–475)

## 2019-05-25 LAB — PHOSPHORUS: Phosphorus: 3.3 mg/dL (ref 2.5–4.6)

## 2019-05-25 LAB — LACTATE DEHYDROGENASE: LDH: 250 U/L — ABNORMAL HIGH (ref 98–192)

## 2019-05-25 LAB — MAGNESIUM: Magnesium: 2.1 mg/dL (ref 1.7–2.4)

## 2019-05-25 LAB — D-DIMER, QUANTITATIVE: D-Dimer, Quant: 11.64 ug/mL-FEU — ABNORMAL HIGH (ref 0.00–0.50)

## 2019-05-25 LAB — FERRITIN: Ferritin: 389 ng/mL — ABNORMAL HIGH (ref 24–336)

## 2019-05-25 MED ORDER — PANTOPRAZOLE SODIUM 40 MG PO TBEC
40.0000 mg | DELAYED_RELEASE_TABLET | Freq: Every day | ORAL | Status: DC
  Administered 2019-05-25 – 2019-05-29 (×5): 40 mg via ORAL
  Filled 2019-05-25 (×4): qty 1

## 2019-05-25 NOTE — Progress Notes (Signed)
Spoke with patient's nurse Ava over the phone. Patient has not had a bowel movement since transfer to 5 W.. He had a bowel movement in ICU prior to transfer which was described as nonbloody. Blood pressure is stable and has not been on IV pressors since Thursday evening. Tolerating diet and does not complain of abdominal pain. Hb today morning is 9.3 was 8.7 yesterday. Patient did require 4 units of PRBC and 2 units of FFP. Patient was on aspirin and Plavix until 05/22/2019 which has been on hold since his episodes of diverticular bleeding. If there is no bleeding for the next 1 week, recommend resuming aspirin. If there is no bleeding for the next 10 days, recommend resuming Plavix as needed. Continue bulk forming agent Metamucil twice a day. GI will sign off, please recall if needed.  Ronnette Juniper, MD

## 2019-05-25 NOTE — Progress Notes (Signed)
Hospitalist daily note   [name] [id number] DOB: [birth year] DOA: 05/15/2019  PCP: [name], MD   Narrative:  40 WM CAD PCI 2014 to RCA, STEMI 1.12.17-status post DES-on Brilinta, prior admission 06/05/2013 diverticulitis in the setting of aspirin and Effient, repeat admission 06/04/2014 hematochezia 2/2 diverticulitis--colonoscopy 919 multiple diverticulosis of the sigmoid descending colon History of CLL, BPH, GERD, dysphagia dysphagia, HFrEF (40-45% 2017) Initial admit date 05/15/2019 respiratory failure secondary to coronavirus-developed hematochezia hypotension 12/17 critical care consulted--placed transiently on pressors, started octreotide, bleeding scan performed placed on Protonix  Data Reviewed:  BUN/creatinine 35/1.3 (close to usual baseline) White count 41 close to baseline hemoglobin 9.3 platelet 178 D-dimer 6.3-->11.6 INR 1.4 LDH 213-->250 Ferritin 453-->389 CRP 0.6-->0.6 Assessment & Plan: Coronavirus 19 infection with pneumonia on admission hypoxia Completed course of remdesivir 12/15 Given steroid burst after initial dosing of Decadron, now on higher dose 120--stop date potentially 12/24 (this would be a total of 15 days) Inflammatory markers unremarkable Repeat CXR in a.m. continues on high flow nasal cannula 3 L Painless hematochezia secondary to likely diverticulosis bleeding scan 12/17 negative for active bleed Hemoglobin appears to be quite stable within the high 8-10 range Octreotide appears to have been discontinued continue pantoprazole 40 every 12 and Zofran Appears to be on heart healthy diet at this time Diabetes mellitus type 2 A1c this admission 6.8 Control is reasonable despite steroids ranging from 152-80 Continue sliding scale alone--note not on meds prior to admission CAD history, PCI 2014, stent 2017 HFrEF EF 40-45% 2017 Aspirin, Plavix need to be held defer to GI when to resume Holding lisinopril five at this time-not on beta-blocker  presumably secondary to hypotension earlier in admission History CLL Needs outpatient follow-up white count is high may need further work-up Prior history of dysphagia Outpatient scope?  Subjective: Sleepy no other issue No n/v Regular stool this am No fever Consultants:   Gastroenterology Procedures:    Antimicrobials:   Remdesivir completed   Objective: Vitals:   05/24/19 1957 05/25/19 0000 05/25/19 0337 05/25/19 0500  BP: 130/62 (!) 146/67 (!) 141/59   Pulse: 67 (!) 54 (!) 51   Resp: 20 19 20    Temp: 98 F (36.7 C) 97.6 F (36.4 C) 97.9 F (36.6 C)   TempSrc: Oral Oral Oral   SpO2: 91% 92% 95%   Weight:    76 kg  Height:        Intake/Output Summary (Last 24 hours) at 05/25/2019 0720 Last data filed at 05/25/2019 0451 Gross per 24 hour  Intake 1572.32 ml  Output 1000 ml  Net 572.32 ml   Filed Weights   05/23/19 0351 05/24/19 0435 05/25/19 0500  Weight: 65.2 kg 65 kg 76 kg    Examination: eomi ncat o rales no rhonchi no focal deficit cta b no added sound s1 s 2 no m/r/g abd soft Neuro intact  Scheduled Meds: . sodium chloride   Intravenous Once  . albuterol  2 puff Inhalation Q8H  . Chlorhexidine Gluconate Cloth  6 each Topical Daily  . cholecalciferol  1,000 Units Oral Daily  . insulin aspart  0-15 Units Subcutaneous TID WC  . ipratropium  2 puff Inhalation Q8H  . mouth rinse  15 mL Mouth Rinse BID  . methylPREDNISolone (SOLU-MEDROL) injection  60 mg Intravenous Q12H  . pantoprazole  40 mg Intravenous Q12H  . psyllium  1 packet Oral BID  . simvastatin  40 mg Oral Daily  . sodium bicarbonate  325 mg Oral  BID  . tamsulosin  0.4 mg Oral QPC breakfast  . vitamin C  500 mg Oral Daily  . zinc sulfate  220 mg Oral Daily   Continuous Infusions: . sodium chloride 125 mL/hr at 05/25/19 0558  . sodium chloride       LOS: 10 days   Time spent: [place], MD [facility]

## 2019-05-26 LAB — CBC WITH DIFFERENTIAL/PLATELET
Abs Immature Granulocytes: 0.17 10*3/uL — ABNORMAL HIGH (ref 0.00–0.07)
Basophils Absolute: 0.1 10*3/uL (ref 0.0–0.1)
Basophils Relative: 0 %
Eosinophils Absolute: 0 10*3/uL (ref 0.0–0.5)
Eosinophils Relative: 0 %
HCT: 26.6 % — ABNORMAL LOW (ref 39.0–52.0)
Hemoglobin: 8.5 g/dL — ABNORMAL LOW (ref 13.0–17.0)
Immature Granulocytes: 0 %
Lymphocytes Relative: 73 %
Lymphs Abs: 28.1 10*3/uL — ABNORMAL HIGH (ref 0.7–4.0)
MCH: 30 pg (ref 26.0–34.0)
MCHC: 32 g/dL (ref 30.0–36.0)
MCV: 94 fL (ref 80.0–100.0)
Monocytes Absolute: 0.2 10*3/uL (ref 0.1–1.0)
Monocytes Relative: 1 %
Neutro Abs: 9.8 10*3/uL — ABNORMAL HIGH (ref 1.7–7.7)
Neutrophils Relative %: 26 %
Platelets: 166 10*3/uL (ref 150–400)
RBC: 2.83 MIL/uL — ABNORMAL LOW (ref 4.22–5.81)
RDW: 14.8 % (ref 11.5–15.5)
WBC: 39 10*3/uL — ABNORMAL HIGH (ref 4.0–10.5)
nRBC: 0 % (ref 0.0–0.2)

## 2019-05-26 LAB — COMPREHENSIVE METABOLIC PANEL
ALT: 13 U/L (ref 0–44)
AST: 21 U/L (ref 15–41)
Albumin: 1.8 g/dL — ABNORMAL LOW (ref 3.5–5.0)
Alkaline Phosphatase: 40 U/L (ref 38–126)
Anion gap: 4 — ABNORMAL LOW (ref 5–15)
BUN: 32 mg/dL — ABNORMAL HIGH (ref 8–23)
CO2: 20 mmol/L — ABNORMAL LOW (ref 22–32)
Calcium: 7.2 mg/dL — ABNORMAL LOW (ref 8.9–10.3)
Chloride: 115 mmol/L — ABNORMAL HIGH (ref 98–111)
Creatinine, Ser: 1.18 mg/dL (ref 0.61–1.24)
GFR calc Af Amer: >60 mL/min (ref >60)
GFR calc non Af Amer: >60 mL/min (ref >60)
Glucose, Bld: 154 mg/dL — ABNORMAL HIGH (ref 70–99)
Potassium: 4.3 mmol/L (ref 3.5–5.1)
Sodium: 139 mmol/L (ref 135–145)
Total Bilirubin: 0.8 mg/dL (ref 0.3–1.2)
Total Protein: 3.8 g/dL — ABNORMAL LOW (ref 6.5–8.1)

## 2019-05-26 LAB — GLUCOSE, CAPILLARY
Glucose-Capillary: 149 mg/dL — ABNORMAL HIGH (ref 70–99)
Glucose-Capillary: 167 mg/dL — ABNORMAL HIGH (ref 70–99)
Glucose-Capillary: 134 mg/dL — ABNORMAL HIGH (ref 70–99)
Glucose-Capillary: 141 mg/dL — ABNORMAL HIGH (ref 70–99)

## 2019-05-26 LAB — FIBRINOGEN: Fibrinogen: 146 mg/dL — ABNORMAL LOW (ref 210–475)

## 2019-05-26 LAB — C-REACTIVE PROTEIN: CRP: 0.5 mg/dL (ref <1.0)

## 2019-05-26 LAB — FERRITIN: Ferritin: 362 ng/mL — ABNORMAL HIGH (ref 24–336)

## 2019-05-26 LAB — D-DIMER, QUANTITATIVE: D-Dimer, Quant: 15.94 ug/mL-FEU — ABNORMAL HIGH (ref 0.00–0.50)

## 2019-05-26 LAB — LACTATE DEHYDROGENASE: LDH: 223 U/L — ABNORMAL HIGH (ref 98–192)

## 2019-05-26 NOTE — Progress Notes (Signed)
Hospitalist daily note   [name] [id number] DOB: May 04, 1946 DOA: 05/15/2019  PCP: [name], MD   Narrative:  49 WM CAD PCI 2014 to RCA, STEMI 1.12.17-status post DES-on Brilinta, prior admission 06/05/2013 diverticulitis in the setting of aspirin and Effient, repeat admission 06/04/2014 hematochezia 2/2 diverticulitis--colonoscopy 919 multiple diverticulosis of the sigmoid descending colon History of CLL, BPH, GERD, dysphagia dysphagia, HFrEF (40-45% 2017) Initial admit date 05/15/2019 respiratory failure secondary to coronavirus-developed hematochezia hypotension 12/17 critical care consulted--placed transiently on pressors, started octreotide, bleeding scan performed placed on Protonix  Data Reviewed:  BUN/creatinine 35/1.3 (close to usual baseline) White count 41 close to baseline hemoglobin 9.3 platelet 178 D-dimer 6.3-->11.6-->15.9 INR 1.4 LDH 213-->250-->223 Ferritin 453-->389-->362 CRP 0.6-->0.6-->0.5 Assessment & Plan: Coronavirus 19 infection with pneumonia on admission hypoxia Completed course of remdesivir 12/15 Given steroid initially, higher dose 120 now--stop date potentially 12/24 (this would be a total of 15 days) Inflammatory markers unremarkable Repeat CXR in a.m. continues on high flow nasal cannula 3 L Elevated D-dimer D-dimer 15.912/20-check lower extremity Dopplers to ensure that there is no DVT Painless hematochezia secondary to likely diverticulosis bleeding scan 12/17 negative for active bleed Episodic dark stool 12/19 p.m.-hemoglobin stable 8-9 range Octreotide appears to have been discontinued, p.o. pantoprazole 40 daily Appears to be on heart healthy diet at this time Diabetes mellitus type 2 A1c this admission 6.8 Control is reasonable despite steroids ranging from 119 through 149 Continue sliding scale alone--note not on meds prior to admission CAD history, PCI 2014, stent 2017 HFrEF EF 40-45% 2017 Per GI aspirin can be resumed 12/27,  Plavix can be resumed 12/29 Holding lisinopril 5 at this time-not on beta-blocker presumably secondary to hypotension earlier in admission History CLL Needs outpatient follow-up white count is high may need further work-up Prior history of dysphagia Outpatient scope?  SCDs at this time, inpatient Called spouse [name] expect [id number] and left voicemail was not available Stabilization prior to hospital discharge in the next several days  Subjective: Desat this morning noted to 76 but sitting in the room he looks comfortable and sitting on 2 L nasal cannula at this time No chest pain no fever No lower extremity edema No nausea no vomiting  Consultants:   Gastroenterology Procedures:    Antimicrobials:   Remdesivir completed   Objective: Vitals:   05/26/19 0008 05/26/19 0451 05/26/19 0816 05/26/19 0944  BP: 126/70 129/70 123/65   Pulse: 70 67 69   Resp: 18 20 19 19   Temp: (!) 97.4 F (36.3 C) (!) 97.4 F (36.3 C) 98 F (36.7 C)   TempSrc: Oral Oral    SpO2: 91% 93% 93% 97%  Weight:  73.2 kg    Height:        Intake/Output Summary (Last 24 hours) at 05/26/2019 1041 Last data filed at 05/26/2019 0800 Gross per 24 hour  Intake 240 ml  Output 1225 ml  Net -985 ml   Filed Weights   05/24/19 0435 05/25/19 0500 05/26/19 0451  Weight: 65 kg 76 kg 73.2 kg    Examination: No rales no rhonchi no focal deficit cta b no added sound seems clear overall on 2 L s1 s 2 no m/r/g abd soft Neuro intact  Scheduled Meds: . sodium chloride   Intravenous Once  . albuterol  2 puff Inhalation Q8H  . Chlorhexidine Gluconate Cloth  6 each Topical Daily  . cholecalciferol  1,000 Units Oral Daily  . insulin aspart  0-15 Units Subcutaneous TID WC  .  ipratropium  2 puff Inhalation Q8H  . mouth rinse  15 mL Mouth Rinse BID  . methylPREDNISolone (SOLU-MEDROL) injection  60 mg Intravenous Q12H  . pantoprazole  40 mg Oral Daily  . psyllium  1 packet Oral BID  . simvastatin   40 mg Oral Daily  . sodium bicarbonate  325 mg Oral BID  . tamsulosin  0.4 mg Oral QPC breakfast  . vitamin C  500 mg Oral Daily  . zinc sulfate  220 mg Oral Daily   Continuous Infusions: . sodium chloride 50 mL/hr at 05/26/19 1034  . sodium chloride       LOS: 11 days   Time spent: [place], MD [facility]

## 2019-05-26 NOTE — Progress Notes (Signed)
Patient became winded and SOB while eating stas noted to be 76%. Bump from 1 L to 6 and patient is now sating 91-92%

## 2019-05-26 NOTE — Progress Notes (Signed)
Patient had 1 bloody stool around 2000 in toilet. VSS. No signs of distress. No further bleeding for remainder of shift.

## 2019-05-27 ENCOUNTER — Inpatient Hospital Stay (HOSPITAL_COMMUNITY): Payer: Medicare HMO

## 2019-05-27 DIAGNOSIS — R791 Abnormal coagulation profile: Secondary | ICD-10-CM

## 2019-05-27 DIAGNOSIS — U071 COVID-19: Secondary | ICD-10-CM

## 2019-05-27 HISTORY — PX: IR IVC FILTER PLMT / S&I /IMG GUID/MOD SED: IMG701

## 2019-05-27 LAB — CBC WITH DIFFERENTIAL/PLATELET
Abs Immature Granulocytes: 0.13 10*3/uL — ABNORMAL HIGH (ref 0.00–0.07)
Basophils Absolute: 0.1 10*3/uL (ref 0.0–0.1)
Basophils Relative: 0 %
Eosinophils Absolute: 0 10*3/uL (ref 0.0–0.5)
Eosinophils Relative: 0 %
HCT: 28.6 % — ABNORMAL LOW (ref 39.0–52.0)
Hemoglobin: 9.4 g/dL — ABNORMAL LOW (ref 13.0–17.0)
Immature Granulocytes: 0 %
Lymphocytes Relative: 74 %
Lymphs Abs: 30.7 10*3/uL — ABNORMAL HIGH (ref 0.7–4.0)
MCH: 30.5 pg (ref 26.0–34.0)
MCHC: 32.9 g/dL (ref 30.0–36.0)
MCV: 92.9 fL (ref 80.0–100.0)
Monocytes Absolute: 0.1 10*3/uL (ref 0.1–1.0)
Monocytes Relative: 0 %
Neutro Abs: 10.6 10*3/uL — ABNORMAL HIGH (ref 1.7–7.7)
Neutrophils Relative %: 26 %
Platelets: 152 10*3/uL (ref 150–400)
RBC: 3.08 MIL/uL — ABNORMAL LOW (ref 4.22–5.81)
RDW: 14.8 % (ref 11.5–15.5)
WBC Morphology: ABNORMAL
WBC: 41.7 10*3/uL — ABNORMAL HIGH (ref 4.0–10.5)
nRBC: 0 % (ref 0.0–0.2)

## 2019-05-27 LAB — COMPREHENSIVE METABOLIC PANEL
ALT: 12 U/L (ref 0–44)
AST: 23 U/L (ref 15–41)
Albumin: 1.8 g/dL — ABNORMAL LOW (ref 3.5–5.0)
Alkaline Phosphatase: 45 U/L (ref 38–126)
Anion gap: 7 (ref 5–15)
BUN: 32 mg/dL — ABNORMAL HIGH (ref 8–23)
CO2: 16 mmol/L — ABNORMAL LOW (ref 22–32)
Calcium: 7.3 mg/dL — ABNORMAL LOW (ref 8.9–10.3)
Chloride: 115 mmol/L — ABNORMAL HIGH (ref 98–111)
Creatinine, Ser: 1.1 mg/dL (ref 0.61–1.24)
GFR calc Af Amer: >60 mL/min (ref >60)
GFR calc non Af Amer: >60 mL/min (ref >60)
Glucose, Bld: 172 mg/dL — ABNORMAL HIGH (ref 70–99)
Potassium: 4.4 mmol/L (ref 3.5–5.1)
Sodium: 138 mmol/L (ref 135–145)
Total Bilirubin: 1 mg/dL (ref 0.3–1.2)
Total Protein: 4 g/dL — ABNORMAL LOW (ref 6.5–8.1)

## 2019-05-27 LAB — GLUCOSE, CAPILLARY
Glucose-Capillary: 153 mg/dL — ABNORMAL HIGH (ref 70–99)
Glucose-Capillary: 171 mg/dL — ABNORMAL HIGH (ref 70–99)
Glucose-Capillary: 148 mg/dL — ABNORMAL HIGH (ref 70–99)
Glucose-Capillary: 216 mg/dL — ABNORMAL HIGH (ref 70–99)

## 2019-05-27 LAB — C-REACTIVE PROTEIN: CRP: <0.5 mg/dL (ref <1.0)

## 2019-05-27 LAB — FERRITIN: Ferritin: 353 ng/mL — ABNORMAL HIGH (ref 24–336)

## 2019-05-27 LAB — LACTATE DEHYDROGENASE: LDH: 317 U/L — ABNORMAL HIGH (ref 98–192)

## 2019-05-27 LAB — FIBRINOGEN: Fibrinogen: 141 mg/dL — ABNORMAL LOW (ref 210–475)

## 2019-05-27 LAB — D-DIMER, QUANTITATIVE: D-Dimer, Quant: >20 ug/mL-FEU — ABNORMAL HIGH (ref 0.00–0.50)

## 2019-05-27 MED ORDER — LIDOCAINE HCL 1 % IJ SOLN
INTRAMUSCULAR | Status: AC
  Filled 2019-05-27: qty 20

## 2019-05-27 MED ORDER — FENTANYL CITRATE (PF) 100 MCG/2ML IJ SOLN
INTRAMUSCULAR | Status: AC
  Filled 2019-05-27: qty 2

## 2019-05-27 MED ORDER — SODIUM CHLORIDE 0.9 % IV SOLN
3.0000 g | Freq: Four times a day (QID) | INTRAVENOUS | Status: DC
  Administered 2019-05-27 – 2019-05-29 (×9): 3 g via INTRAVENOUS
  Filled 2019-05-27: qty 3
  Filled 2019-05-27: qty 8
  Filled 2019-05-27: qty 3
  Filled 2019-05-27 (×2): qty 8
  Filled 2019-05-27 (×4): qty 3
  Filled 2019-05-27 (×3): qty 8

## 2019-05-27 MED ORDER — FENTANYL CITRATE (PF) 100 MCG/2ML IJ SOLN
INTRAMUSCULAR | Status: AC | PRN
  Administered 2019-05-27: 50 ug via INTRAVENOUS

## 2019-05-27 MED ORDER — IOHEXOL 300 MG/ML  SOLN
100.0000 mL | Freq: Once | INTRAMUSCULAR | Status: AC | PRN
  Administered 2019-05-27: 50 mL via INTRAVENOUS

## 2019-05-27 NOTE — Progress Notes (Signed)
VASCULAR LAB PRELIMINARY  PRELIMINARY  PRELIMINARY  PRELIMINARY  Bilateral lower extremity venous duplex completed.    Preliminary report:  See CV proc for preliminary results.  Messaged Dr. Verlon Au.   Grabiel Schmutz, RVT 05/27/2019, 12:53 PM

## 2019-05-27 NOTE — Procedures (Signed)
Interventional Radiology Procedure Note  Procedure: Placement of a potentially retrievable IVC filter for bilateral age-indeterminate DVT in the setting of a recent GI bleed.   Complications: None  Estimated Blood Loss: None  Recommendations: - Return to room - HOB elevated, bedrest x 2 hrs  Signed,  Criselda Peaches, MD

## 2019-05-27 NOTE — Progress Notes (Signed)
OT Cancellation Note  Patient Details Name: [name] MRN: [id number] DOB: 10/29/1945   Cancelled Treatment:    Reason Eval/Treat Not Completed: Other (comment) RN reports pt experiencing bilateral DVTs. Will hold off on OT session until pt medically stable. Will continue to follow acutely per POC.   Lanier Clam., COTA/L Acute Rehabilitation Services [phone] [place] 05/27/2019, 2:38 PM

## 2019-05-27 NOTE — Sedation Documentation (Signed)
Pt is resting, vss. No complaints. Procedure started

## 2019-05-27 NOTE — Sedation Documentation (Signed)
Report called to floor RN. Pt tolerated procedure very well. No complaints, VSS.

## 2019-05-27 NOTE — Progress Notes (Signed)
Chief Complaint: Patient was seen in consultation today for IVC filter placement.  Referring Physician(s): Dr. Verlon Au  Supervising Physician: Jacqulynn Cadet  Patient Status: [facility] - In-pt  History of Present Illness: [name] is a 73 y.o. male admitted with COVID+ pneumonia as well as other acute issues including hematochezia. Pt normally on anticoagulation due to CAD with prior stent placement. Anticoagulation has been discontinued but pt continues to pass dark heme + stools. Hx CLL also. Noted to have elevated D-Dimer. CTA of chest shows no PE but LE venous duplex is positive for bilat DVT. Given that he can't be safely anticoagulated, IR is asked to place IVC filter. PMHx, meds, labs, imaging, allergies reviewed.    Past Medical History:  Diagnosis Date  . Acute MI, inferolateral wall, initial episode of care ([place]) 06/16/15   99% dCx   . BPH (benign prostatic hyperplasia) 04/08/2011  . CAD S/P percutaneous coronary angioplasty 11/23/12; 06/16/15   a. 2 overlapping mRCA lesions - Xience Xpedition DES 2.75 mm x 18 mm (3.0 mm);; b. Inf-Lat STEMI 06/16/2015 - dCx Asp Thrombectomy & PCI 3.0 x 15 Xience drDES (3.6 mm)  . Chronic headache   . CLL (chronic lymphocytic leukemia) ([place]) 04/08/2011  . Depression   . Diverticulitis   . Gallstone   . GERD (gastroesophageal reflux disease)   . Hypercholesteremia   . Ischemic cardiomyopathy    Echo 06/18/2015 EF 35-40% after lateral MI -> repeat echo 08/06/2015: EF 40-45% with basal-mid inferolateral akinesis and hypokinesis of the lateral wall.  . NSTEMI (non-ST elevated myocardial infarction) ([place]) 11/23/12   NSTEMI 11/23/2012 RCA lesion - PCI with DES; moderate LAD disease  . PTSD (post-traumatic stress disorder)    With Depression - since MI    Past Surgical History:  Procedure Laterality Date  . APPENDECTOMY  1968-70  . CARDIAC CATHETERIZATION N/A 06/16/2015   Procedure: Left Heart Cath and Coronary Angiography;  Surgeon:  Jettie Booze, MD;  Location: [place] [facility];  Service: Cardiovascular: 99% thrombotic dCx -> asp thrombectomy & PCI. RCA Stents Patent.   Marland Kitchen CARDIAC CATHETERIZATION N/A 06/16/2015   Procedure: Coronary Stent Intervention;  Surgeon: Jettie Booze, MD;  Location: [place] [facility];  Service: Cardiovascular: PCI dCx = thrombectomy -> 3.0 x 15 Xience drug-eluting stent (3.6 mm)   . COLONOSCOPY  2008   [id number] diverticula, diminutive polyp in the cecum, s/p bx Normal rectum. Tubular adenoma  . COLONOSCOPY N/A 10/17/2012   Dr. Gala Romney: colonic diverticulosis, tubular adenoma, surveillance due May 2019  . COLONOSCOPY N/A 02/21/2018   Procedure: COLONOSCOPY;  Surgeon: Daneil Dolin, MD;  Location: [facility];  Service: Endoscopy;  Laterality: N/A;  12:00  . CORONARY STENT PLACEMENT  11/23/12   Mid RCA -- Xience eXp - 2.75 mm x 18 mm DES (3.88mm) , Dr. Ellyn Hack  . LEFT HEART CATHETERIZATION WITH CORONARY ANGIOGRAM N/A 11/23/2012   Procedure: LEFT HEART CATHETERIZATION WITH CORONARY ANGIOGRAM;  Surgeon: Leonie Man, MD;  Location: [facility] CATH LAB;  Service: Cardiovascular: NSTEMI: Culprit = mid RCA 95% & 75%; LAD ~40-50%, Small RI ~60%; EF ~60%, mild inf-basal HK     . TRANSTHORACIC ECHOCARDIOGRAM  January 2017; March 2017   a. EF 35-40%. Entire inferior hypokinesis and akinesis of the basal and mid inferolateral and anterolateral /distal lateral walls;; b. EF 40 and 45%. Akinesis of basal-mid inferolateral wall. Hypokinesis of entire lateral wall.    Allergies: Patient has no known allergies.  Medications:  Current Facility-Administered Medications:  .  0.9 %  sodium chloride infusion (Manually program via Guardrails IV Fluids), , Intravenous, Once, Kirby-Graham, Karsten Fells, NP, Stopped at 05/23/19 [phone] .  0.9 %  sodium chloride infusion, , Intravenous, Continuous, Samtani, Jai-Gurmukh, MD, Last Rate: 50 mL/hr at 05/26/19 1034, New Bag at 05/26/19 1034 .  0.9 %  sodium chloride  infusion, 250 mL, Intravenous, Continuous, Anders Simmonds, MD .  acetaminophen (TYLENOL) tablet 650 mg, 650 mg, Oral, Q6H PRN, Vashti Hey, MD, 650 mg at 05/26/19 1939 .  albuterol (VENTOLIN HFA) 108 (90 Base) MCG/ACT inhaler 2 puff, 2 puff, Inhalation, Q8H, Hall, Carole N, DO, 2 puff at 05/27/19 1352 .  Ampicillin-Sulbactam (UNASYN) 3 g in sodium chloride 0.9 % 100 mL IVPB, 3 g, Intravenous, Q6H, Samtani, Jai-Gurmukh, MD, Last Rate: 200 mL/hr at 05/27/19 1440, 3 g at 05/27/19 1440 .  Chlorhexidine Gluconate Cloth 2 % PADS 6 each, 6 each, Topical, Daily, Rush Farmer, MD, 6 each at 05/26/19 2051 .  cholecalciferol (VITAMIN D3) tablet 1,000 Units, 1,000 Units, Oral, Daily, Irene Pap N, DO, 1,000 Units at 05/27/19 1029 .  insulin aspart (novoLOG) injection 0-15 Units, 0-15 Units, Subcutaneous, TID WC, Rush Farmer, MD, 3 Units at 05/27/19 1045 .  ipratropium (ATROVENT HFA) inhaler 2 puff, 2 puff, Inhalation, Q8H, Hall, Carole N, DO, 2 puff at 05/27/19 1352 .  MEDLINE mouth rinse, 15 mL, Mouth Rinse, BID, Hall, Carole N, DO, 15 mL at 05/27/19 1046 .  methylPREDNISolone sodium succinate (SOLU-MEDROL) 125 mg/2 mL injection 60 mg, 60 mg, Intravenous, Q12H, Rush Farmer, MD, 60 mg at 05/27/19 1033 .  ondansetron (ZOFRAN) tablet 4 mg, 4 mg, Oral, Q6H PRN **OR** ondansetron (ZOFRAN) injection 4 mg, 4 mg, Intravenous, Q6H PRN, Jamse Arn, Dewaine Oats Tublu, MD .  pantoprazole (PROTONIX) EC tablet 40 mg, 40 mg, Oral, Daily, Samtani, Jai-Gurmukh, MD, 40 mg at 05/27/19 1029 .  psyllium (HYDROCIL/METAMUCIL) packet 1 packet, 1 packet, Oral, BID, Ronnette Juniper, MD, 1 packet at 05/27/19 1044 .  simvastatin (ZOCOR) tablet 40 mg, 40 mg, Oral, Daily, Bonnell Public Tublu, MD, 40 mg at 05/26/19 1721 .  sodium bicarbonate tablet 325 mg, 325 mg, Oral, BID, Jennye Boroughs, MD, 325 mg at 05/27/19 1030 .  tamsulosin (FLOMAX) capsule 0.4 mg, 0.4 mg, Oral, QPC breakfast, Hall, Carole N, DO, 0.4 mg at  05/27/19 1029 .  vitamin C (ASCORBIC ACID) tablet 500 mg, 500 mg, Oral, Daily, Hall, Carole N, DO, 500 mg at 05/27/19 1029 .  zinc sulfate capsule 220 mg, 220 mg, Oral, Daily, Irene Pap N, DO, 220 mg at 05/27/19 1044    Family History  Problem Relation Age of Onset  . Diabetes Mother   . Cancer Father   . Colon cancer Neg Hx     Social History   Socioeconomic History  . Marital status: Married    Spouse name: Not on file  . Number of children: Not on file  . Years of education: Not on file  . Highest education level: Not on file  Occupational History  . Not on file  Tobacco Use  . Smoking status: Former Smoker    Packs/day: 0.25    Years: 2.00    Pack years: 0.50    Types: Cigarettes    Quit date: 06/07/1963    Years since quitting: 56.0  . Smokeless tobacco: Never Used  Substance and Sexual Activity  . Alcohol use: No    Alcohol/week: 0.0 standard drinks  Comment: Occasionally drinks red wine  . Drug use: No  . Sexual activity: Not on file  Other Topics Concern  . Not on file  Social History Narrative   Exercises 3 to 4 days a week at the fitness center for about a half an hour at a time.   "former smoker". Does not drink EtOH   Married.   Social Determinants of Health   Financial Resource Strain:   . Difficulty of Paying Living Expenses: Not on file  Food Insecurity:   . Worried About Charity fundraiser in the Last Year: Not on file  . Ran Out of Food in the Last Year: Not on file  Transportation Needs:   . Lack of Transportation (Medical): Not on file  . Lack of Transportation (Non-Medical): Not on file  Physical Activity:   . Days of Exercise per Week: Not on file  . Minutes of Exercise per Session: Not on file  Stress:   . Feeling of Stress : Not on file  Social Connections:   . Frequency of Communication with Friends and Family: Not on file  . Frequency of Social Gatherings with Friends and Family: Not on file  . Attends Religious Services: Not  on file  . Active Member of Clubs or Organizations: Not on file  . Attends Archivist Meetings: Not on file  . Marital Status: Not on file     Review of Systems: A 12 point ROS discussed and pertinent positives are indicated in the HPI above.  All other systems are negative.  Review of Systems  Vital Signs: BP 136/68 (BP Location: Left Arm)   Pulse 69   Temp 97.7 F (36.5 C) (Oral)   Resp 18   Ht 5\' 10"  (1.778 m)   Wt 73.6 kg   SpO2 91%   BMI 23.28 kg/m   Physical Exam Constitutional:      Appearance: He is not ill-appearing.  HENT:     Mouth/Throat:     Mouth: Mucous membranes are moist.     Pharynx: Oropharynx is clear.  Cardiovascular:     Rate and Rhythm: Normal rate and regular rhythm.     Heart sounds: Normal heart sounds.  Pulmonary:     Effort: Pulmonary effort is normal.     Breath sounds: Normal breath sounds.  Musculoskeletal:        General: No tenderness.     Right lower leg: No edema.     Left lower leg: No edema.  Skin:    General: Skin is warm and dry.  Neurological:     General: No focal deficit present.     Mental Status: He is alert and oriented to person, place, and time.  Psychiatric:        Mood and Affect: Mood normal.        Thought Content: Thought content normal.        Judgment: Judgment normal.     Imaging: am PNA cxr  Result Date: 05/27/2019 CLINICAL DATA:  Pneumonia EXAM: CHEST  1 VIEW COMPARISON:  May 23, 2019. FINDINGS: Ill-defined airspace opacity remains in the right upper lobe. There is subtle airspace opacity in each lung base, slightly increased bilaterally. There is a minimal left pleural effusion. Heart is upper normal in size with pulmonary vascularity normal. No adenopathy. Interval removal of central catheter without pneumothorax. No bone lesions. IMPRESSION: Areas of ill-defined airspace opacity in the right upper lobe, stable, as well as in the lung bases,  slightly increased. Stable cardiac silhouette.  No adenopathy appreciable. Minimal left pleural effusion present. Electronically Signed   By: Lowella Grip III M.D.   On: 05/27/2019 07:12   CT Head Wo Contrast  Result Date: 05/15/2019 CLINICAL DATA:  Trauma EXAM: CT HEAD WITHOUT CONTRAST CT CERVICAL SPINE WITHOUT CONTRAST TECHNIQUE: Multidetector CT imaging of the head and cervical spine was performed following the standard protocol without intravenous contrast. Multiplanar CT image reconstructions of the cervical spine were also generated. COMPARISON:  None. FINDINGS: CT HEAD FINDINGS Brain: There is no acute intracranial hemorrhage, mass effect or edema. Gray-white differentiation is preserved. Ventricles and sulci are within limits in size and configuration. Patchy and confluent hypoattenuation in the supratentorial white matter is nonspecific but probably reflects moderate chronic microvascular ischemic changes. There is no extra-axial fluid collection. Vascular: There is intracranial atherosclerotic calcification at the skull base. Skull: Unremarkable. Sinuses/Orbits: Moderate ethmoid mucosal thickening. Orbits are unremarkable. Other: Mild patchy mastoid opacification CT CERVICAL SPINE FINDINGS Alignment: Anteroposterior alignment is maintained. Skull base and vertebrae: No acute fracture. Vertebral body heights are preserved. Soft tissues and spinal canal: No prevertebral fluid or swelling. No visible canal hematoma. Disc levels: Mild degenerative changes are present without high-grade osseous encroachment on the spinal canal. Upper chest: Partially imaged right upper lobe opacity. Other: None IMPRESSION: No evidence of acute intracranial injury or acute cervical spine fracture. Moderate chronic microvascular ischemic changes. Partially imaged right upper lobe opacity. Electronically Signed   By: Macy Mis M.D.   On: 05/15/2019 16:45   CT ANGIO CHEST PE W OR WO CONTRAST  Result Date: 05/19/2019 CLINICAL DATA:  Sepsis, fever, tachypnea,  tachycardia, leukocytosis, lower lobe pneumonia, COVID-19 positive, elevated D-dimer EXAM: CT ANGIOGRAPHY CHEST WITH CONTRAST TECHNIQUE: Multidetector CT imaging of the chest was performed using the standard protocol during bolus administration of intravenous contrast. Multiplanar CT image reconstructions and MIPs were obtained to evaluate the vascular anatomy. CONTRAST:  92mL OMNIPAQUE IOHEXOL 350 MG/ML SOLN COMPARISON:  Same day chest radiograph FINDINGS: Cardiovascular: Satisfactory opacification of the pulmonary arteries to the segmental level. No evidence of pulmonary embolism. Cardiomegaly. Three-vessel coronary artery calcifications. No pericardial effusion. Mediastinum/Nodes: Enlarged mediastinal and hilar lymph nodes. Thyroid gland, trachea, and esophagus demonstrate no significant findings. Lungs/Pleura: There is extensive, predominantly dependent ground-glass and consolidation of the lungs. Trace bilateral pleural effusions. Upper Abdomen: No acute abnormality. Musculoskeletal: No chest wall abnormality. No acute or significant osseous findings. Review of the MIP images confirms the above findings. IMPRESSION: 1. Negative examination for pulmonary embolism. 2. Extensive, predominantly dependent ground-glass and consolidation of the lungs, with trace bilateral pleural effusions. Findings are consistent with multifocal infection and in keeping with reported diagnosis of COVID-19 pneumonia. 3. Enlarged mediastinal and hilar lymph nodes, likely reactive. 4. Coronary artery disease. Electronically Signed   By: Eddie Candle M.D.   On: 05/19/2019 14:46   CT Cervical Spine Wo Contrast  Result Date: 05/15/2019 CLINICAL DATA:  Trauma EXAM: CT HEAD WITHOUT CONTRAST CT CERVICAL SPINE WITHOUT CONTRAST TECHNIQUE: Multidetector CT imaging of the head and cervical spine was performed following the standard protocol without intravenous contrast. Multiplanar CT image reconstructions of the cervical spine were also  generated. COMPARISON:  None. FINDINGS: CT HEAD FINDINGS Brain: There is no acute intracranial hemorrhage, mass effect or edema. Gray-white differentiation is preserved. Ventricles and sulci are within limits in size and configuration. Patchy and confluent hypoattenuation in the supratentorial white matter is nonspecific but probably reflects moderate chronic microvascular ischemic changes. There is no  extra-axial fluid collection. Vascular: There is intracranial atherosclerotic calcification at the skull base. Skull: Unremarkable. Sinuses/Orbits: Moderate ethmoid mucosal thickening. Orbits are unremarkable. Other: Mild patchy mastoid opacification CT CERVICAL SPINE FINDINGS Alignment: Anteroposterior alignment is maintained. Skull base and vertebrae: No acute fracture. Vertebral body heights are preserved. Soft tissues and spinal canal: No prevertebral fluid or swelling. No visible canal hematoma. Disc levels: Mild degenerative changes are present without high-grade osseous encroachment on the spinal canal. Upper chest: Partially imaged right upper lobe opacity. Other: None IMPRESSION: No evidence of acute intracranial injury or acute cervical spine fracture. Moderate chronic microvascular ischemic changes. Partially imaged right upper lobe opacity. Electronically Signed   By: Macy Mis M.D.   On: 05/15/2019 16:45   NM GI Blood Loss  Result Date: 05/23/2019 CLINICAL DATA:  Hematochezia and hypotension earlier this morning. EXAM: NUCLEAR MEDICINE GASTROINTESTINAL BLEEDING SCAN TECHNIQUE: Sequential abdominal images were obtained following intravenous administration of Tc-85m labeled red blood cells. RADIOPHARMACEUTICALS:  26.5 mCi Tc-51m pertechnetate in-vitro labeled red cells. COMPARISON:  None. FINDINGS: No abnormal focus of activity is demonstrated in the bowel to suggest a gastrointestinal bleed. Normal physiologic biodistribution of the radiotracer is demonstrated throughout the abdomen. IMPRESSION:  Negative. Electronically Signed   By: Titus Dubin M.D.   On: 05/23/2019 19:28   DG Pelvis Portable  Result Date: 05/15/2019 CLINICAL DATA:  Cough, sepsis, UTI EXAM: PORTABLE PELVIS 1-2 VIEWS COMPARISON:  Portable exam 1408 hours without priors for comparison FINDINGS: Osseous demineralization. Hip and SI joint spaces preserved. No acute fracture, dislocation, or bone destruction. Scattered atherosclerotic calcifications at the femoral arteries. IMPRESSION: No acute abnormalities. Electronically Signed   By: Lavonia Dana M.D.   On: 05/15/2019 14:27   DG CHEST PORT 1 VIEW  Result Date: 05/23/2019 CLINICAL DATA:  Central line placement.  COVID-19 positive. EXAM: PORTABLE CHEST 1 VIEW COMPARISON:  Radiograph and CT 05/19/2019 FINDINGS: Right internal jugular central venous catheter tip at the atrial caval junction. No pneumothorax. Bilateral heterogeneous airspace opacities with improvement on the left from prior exam. Right lung opacities are not significantly changed. Unchanged heart size and mediastinal contours. No large pleural effusion. IMPRESSION: 1. Right internal jugular central venous catheter tip at the atrial caval junction. No pneumothorax. 2. Bilateral pneumonia with improvement on the left from prior exam. Electronically Signed   By: Keith Rake M.D.   On: 05/23/2019 06:58   DG CHEST PORT 1 VIEW  Result Date: 05/19/2019 CLINICAL DATA:  73 year old male positive COVID-19. Shortness of breath and chest pain. EXAM: PORTABLE CHEST 1 VIEW COMPARISON:  Portable chest 05/15/2019 and earlier. FINDINGS: Portable AP semi upright view at 0805 hours. Increasing asymmetric bilateral pulmonary interstitial opacity more pronounced on the left. Stable lung volumes. Mediastinal contours remain normal. Visualized tracheal air column is within normal limits. No pneumothorax or pleural effusion identified. Stable visualized osseous structures. Negative visible bowel gas pattern. IMPRESSION:  Radiographically progressed COVID-19 pneumonia since 05/15/2019, greater on the left. Electronically Signed   By: Genevie Ann M.D.   On: 05/19/2019 12:18   DG Chest Port 1 View  Result Date: 05/15/2019 CLINICAL DATA:  Cough, sepsis, UTI, coronary disease post MI, ischemic cardiomyopathy, CLL, hypertension EXAM: PORTABLE CHEST 1 VIEW COMPARISON:  Portable exam 1410 hours compared to 04/05/2018 FINDINGS: Normal heart size and mediastinal contours. Minimal patchy opacities in the mid to lower lungs question subtle infiltrates. No pleural effusion or pneumothorax. Bones demineralized. IMPRESSION: Patchy opacities in the mid to lower lungs bilaterally suspicious for subtle pulmonary  infiltrates. Electronically Signed   By: Lavonia Dana M.D.   On: 05/15/2019 14:26   VAS [place] LOWER EXTREMITY VENOUS (DVT)  Result Date: 05/27/2019  Lower Venous Study Indications: Covid-19, Elevated D-Dimer.  Limitations: Body habitus and postioning, confusion. Comparison Study: No prior study on file for comparison Performing Technologist: Sharion Dove RVS  Examination Guidelines: A complete evaluation includes B-mode imaging, spectral Doppler, color Doppler, and power Doppler as needed of all accessible portions of each vessel. Bilateral testing is considered an integral part of a complete examination. Limited examinations for reoccurring indications may be performed as noted.  +---------+---------------+---------+-----------+----------+-----------------+ RIGHT    CompressibilityPhasicitySpontaneityPropertiesThrombus Aging    +---------+---------------+---------+-----------+----------+-----------------+ CFV      Full           Yes      Yes                                    +---------+---------------+---------+-----------+----------+-----------------+ SFJ      Full                                                           +---------+---------------+---------+-----------+----------+-----------------+ FV Prox  Full                                          Age Indeterminate +---------+---------------+---------+-----------+----------+-----------------+ FV Mid   Partial                                      Age Indeterminate +---------+---------------+---------+-----------+----------+-----------------+ FV DistalPartial                                      Age Indeterminate +---------+---------------+---------+-----------+----------+-----------------+ PFV      Full                                                           +---------+---------------+---------+-----------+----------+-----------------+ POP      Partial                                      Age Indeterminate +---------+---------------+---------+-----------+----------+-----------------+ PTV      None                                         Age Indeterminate +---------+---------------+---------+-----------+----------+-----------------+   +---------+---------------+---------+-----------+----------+-----------------+ LEFT     CompressibilityPhasicitySpontaneityPropertiesThrombus Aging    +---------+---------------+---------+-----------+----------+-----------------+ CFV      Full           Yes      Yes                                    +---------+---------------+---------+-----------+----------+-----------------+  SFJ      Full                                                           +---------+---------------+---------+-----------+----------+-----------------+ FV Prox  Partial                                      Age Indeterminate +---------+---------------+---------+-----------+----------+-----------------+ FV Mid   Partial                                      Age Indeterminate +---------+---------------+---------+-----------+----------+-----------------+ FV DistalPartial                                      Age Indeterminate  +---------+---------------+---------+-----------+----------+-----------------+ PFV      Full                                                           +---------+---------------+---------+-----------+----------+-----------------+ POP      Partial        No       No                   Age Indeterminate +---------+---------------+---------+-----------+----------+-----------------+ PTV      Full                                                           +---------+---------------+---------+-----------+----------+-----------------+ PERO     None                                         Age Indeterminate +---------+---------------+---------+-----------+----------+-----------------+    Summary: Right: Findings consistent with age indeterminate deep vein thrombosis involving the right femoral vein, right popliteal vein, and right posterior tibial veins. Left: Findings consistent with age indeterminate deep vein thrombosis involving the left femoral vein, left popliteal vein, and left peroneal veins.  *See table(s) above for measurements and observations.    Preliminary     Labs:  CBC: Recent Labs    05/24/19 0338 05/25/19 0426 05/26/19 0356 05/27/19 0310  WBC 32.8* 41.1* 39.0* 41.7*  HGB 8.7* 9.3* 8.5* 9.4*  HCT 26.1* 29.1* 26.6* 28.6*  PLT 151 178 166 152    COAGS: Recent Labs    05/15/19 1359 05/23/19 0156  INR 1.2 1.4*  APTT 36  --     BMP: Recent Labs    05/24/19 0338 05/25/19 0426 05/26/19 0356 05/27/19 0310  NA 141 142 139 138  K 4.5 4.6 4.3 4.4  CL 114* 116* 115* 115*  CO2 21* 20* 20* 16*  GLUCOSE 192* 152* 154* 172*  BUN 38* 35* 32*  32*  CALCIUM 7.0* 7.4* 7.2* 7.3*  CREATININE 1.29* 1.30* 1.18 1.10  GFRNONAA 54* 53* >60 >60  GFRAA >60 >60 >60 >60    LIVER FUNCTION TESTS: Recent Labs    05/23/19 0156 05/24/19 0338 05/26/19 0356 05/27/19 0310  BILITOT 0.8 1.0 0.8 1.0  AST 22 19 21 23   ALT 14 12 13 12   ALKPHOS 57 40 40 45  PROT 4.5* 3.7*  3.8* 4.0*  ALBUMIN 2.0* 1.8* 1.8* 1.8*    TUMOR MARKERS: No results for input(s): AFPTM, CEA, CA199, CHROMGRNA in the last 8760 hours.  Assessment and Plan: Acute DVT in setting of ongoing GI bleed. Cannot anticoagulate safely/properly. IVC Filter indicated. Discussed with pt plan for filter placement and expectation that it can be retrieved later on if he is able to safely resume anticoagulation. Labs reviewed, Cr back to baseline. Pt ate lunch but is agreeable to have with procedure with local anesthetic. Risks and benefits discussed with the patient including, but not limited to bleeding, infection, contrast induced renal failure, filter fracture or migration which can lead to emergency surgery or even death, strut penetration with damage or irritation to adjacent structures and caval thrombosis.  All of the patient's questions were answered, patient is agreeable to proceed. Consent signed and in chart.    Thank you for this interesting consult.  I greatly enjoyed meeting REI HABERKORN and look forward to participating in their care.  A copy of this report was sent to the requesting provider on this date.  Electronically Signed: Ascencion Dike, PA-C 05/27/2019, 3:08 PM   I spent a total of 25 minutes in face to face in clinical consultation, greater than 50% of which was counseling/coordinating care for IVC filter

## 2019-05-27 NOTE — Progress Notes (Signed)
Patient transported to radiology. IVC filter placement via bed accompanied by charge nurse.

## 2019-05-27 NOTE — Progress Notes (Signed)
Patient had 2 dark red bowel movements during shift. VSS. Will monitor.

## 2019-05-27 NOTE — Progress Notes (Signed)
Pharmacy Antibiotic Note  GRAHM SALMINEN is a 73 y.o. male admitted on 05/15/2019 with pneumonia.  Pharmacy has been consulted for Unasyn dosing.  Plan: Unasyn 3 grams iv Q 6 hours Pharmacy to sign off and follow peripherally for changes in renal function  Height: 5\' 10"  (177.8 cm) Weight: 162 lb 4.1 oz (73.6 kg) IBW/kg (Calculated) : 73  Temp (24hrs), Avg:97.8 F (36.6 C), Min:97.6 F (36.4 C), Max:98.1 F (36.7 C)  Recent Labs  Lab 05/23/19 0156 05/23/19 1542 05/24/19 0338 05/25/19 0426 05/26/19 0356 05/27/19 0310  WBC 50.1* 44.5* 32.8* 41.1* 39.0* 41.7*  CREATININE 1.50*  --  1.29* 1.30* 1.18 1.10    Estimated Creatinine Clearance: 59.9 mL/min (by C-G formula based on SCr of 1.1 mg/dL).    No Known Allergies   Thank you for allowing pharmacy to be a part of this patient's care. Anette Guarneri, PharmD 05/27/2019 1:29 PM

## 2019-05-27 NOTE — Progress Notes (Signed)
Hospitalist daily note   CLAIBORNE BINGAMAN [id number] DOB: 08/17/1945 DOA: 05/15/2019  PCP: [name], MD   Narrative:  16 WM CAD PCI 2014 to RCA, STEMI 1.12.17-status post DES-on Brilinta, prior admission 06/05/2013 diverticulitis in the setting of aspirin and Effient, repeat admission 06/04/2014 hematochezia 2/2 diverticulitis--colonoscopy 919 multiple diverticulosis of the sigmoid descending colon History of CLL, BPH, GERD, dysphagia dysphagia, HFrEF (40-45% 2017) Initial admit date 05/15/2019 respiratory failure secondary to coronavirus-developed hematochezia hypotension 12/17 critical care consulted--placed transiently on pressors, started octreotide, bleeding scan performed placed on Protonix  Data Reviewed:  BUN/creatinine 35/1.3 (close to usual baseline) White count 41 close to baseline hemoglobin 9.3 platelet 178 D-dimer 6.3-->11.6-->15.9 INR 1.4 LDH 213-->250-->223 Ferritin 453-->389-->362 CRP 0.6-->0.6-->0.5 Assessment & Plan: Coronavirus 19 infection with pneumonia on admission hypoxia Completed course of remdesivir 12/15 Given steroid initially, higher dose 120 now--stop date potentially 12/24 (this would be a total of 15 days) Inflammatory markers unremarkable Requiring more oxygen probably secondary to right upper lobe pneumonia We will start Unasyn given potential aspiration and asked speech to see Elevated D-dimer D-dimer 12/21 elevated up to 15.9-duplex ultrasound shows indeterminant DVT bilateral femoral popliteal as well as right tibial veins so patient cannot get anticoagulated because of bleed and will likely need IVC filter and I have consulted IR Painless hematochezia secondary to likely diverticulosis bleeding scan 12/17 negative for active bleed Dark stools continue however-hemoglobin up to 9.4 Octreotide discontinued, p.o. pantoprazole 40 daily Appears to be on heart healthy diet at this time Diabetes mellitus type 2 A1c this admission 6.8 Continue sliding  scale alone--note not on meds prior to admission-sugars 1 53-1 70 CAD history, PCI 2014, stent 2017 HFrEF EF 40-45% 2017 Per GI aspirin can be resumed 12/27, Plavix can be resumed 12/29 Holding lisinopril 5 at this time-not on beta-blocker presumably secondary to hypotension earlier in admission History CLL Needs outpatient follow-up white count is high may need further work-up Prior history of dysphagia Outpatient scope?  SCDs at this time, inpatient Called spouse Kaelin Deman on ppone and updated [id number]  unclear dispo  Subjective: Desat this morning noted to 76 but sitting in the room he looks comfortable and sitting on 2 L nasal cannula at this time He is angry at not being able to go home  Consultants:   Gastroenterology Procedures:    Antimicrobials:   Remdesivir completed   Objective: Vitals:   05/27/19 0743 05/27/19 1058 05/27/19 1133 05/27/19 1135  BP: 131/70  136/68 136/68  Pulse: 71   69  Resp:      Temp: 97.7 F (36.5 C) 97.6 F (36.4 C)  97.7 F (36.5 C)  TempSrc: Oral   Oral  SpO2: 95%  93% 90%  Weight:      Height:        Intake/Output Summary (Last 24 hours) at 05/27/2019 1312 Last data filed at 05/27/2019 1205 Gross per 24 hour  Intake 480 ml  Output 1225 ml  Net -745 ml   Filed Weights   05/25/19 0500 05/26/19 0451 05/27/19 0515  Weight: 76 kg 73.2 kg 73.6 kg    Examination: No rales no rhonchi no focal deficit cta b no added sound seems clear overall on 2 L s1 s 2 no m/r/g abd soft Neuro intact  Scheduled Meds: . sodium chloride   Intravenous Once  . albuterol  2 puff Inhalation Q8H  . Chlorhexidine Gluconate Cloth  6 each Topical Daily  . cholecalciferol  1,000 Units Oral Daily  .  insulin aspart  0-15 Units Subcutaneous TID WC  . ipratropium  2 puff Inhalation Q8H  . mouth rinse  15 mL Mouth Rinse BID  . methylPREDNISolone (SOLU-MEDROL) injection  60 mg Intravenous Q12H  . pantoprazole  40 mg Oral Daily  . psyllium  1  packet Oral BID  . simvastatin  40 mg Oral Daily  . sodium bicarbonate  325 mg Oral BID  . tamsulosin  0.4 mg Oral QPC breakfast  . vitamin C  500 mg Oral Daily  . zinc sulfate  220 mg Oral Daily   Continuous Infusions: . sodium chloride 50 mL/hr at 05/26/19 1034  . sodium chloride       LOS: 12 days   Time spent: [place], MD [facility]

## 2019-05-27 NOTE — Sedation Documentation (Signed)
Vital signs stable. 

## 2019-05-27 NOTE — Progress Notes (Signed)
Patient on 6L of O2 via nasal cannula and tolerating well. Sats are 93-97%. Patient wife updated.

## 2019-05-28 LAB — CBC WITH DIFFERENTIAL/PLATELET
Abs Immature Granulocytes: 0.16 10*3/uL — ABNORMAL HIGH (ref 0.00–0.07)
Basophils Absolute: 0.1 10*3/uL (ref 0.0–0.1)
Basophils Relative: 0 %
Eosinophils Absolute: 0 10*3/uL (ref 0.0–0.5)
Eosinophils Relative: 0 %
HCT: 29.4 % — ABNORMAL LOW (ref 39.0–52.0)
Hemoglobin: 9.2 g/dL — ABNORMAL LOW (ref 13.0–17.0)
Immature Granulocytes: 0 %
Lymphocytes Relative: 74 %
Lymphs Abs: 30.2 10*3/uL — ABNORMAL HIGH (ref 0.7–4.0)
MCH: 30 pg (ref 26.0–34.0)
MCHC: 31.3 g/dL (ref 30.0–36.0)
MCV: 95.8 fL (ref 80.0–100.0)
Monocytes Absolute: 0.1 10*3/uL (ref 0.1–1.0)
Monocytes Relative: 0 %
Neutro Abs: 10.5 10*3/uL — ABNORMAL HIGH (ref 1.7–7.7)
Neutrophils Relative %: 26 %
Platelets: 140 10*3/uL — ABNORMAL LOW (ref 150–400)
RBC: 3.07 MIL/uL — ABNORMAL LOW (ref 4.22–5.81)
RDW: 15.1 % (ref 11.5–15.5)
WBC: 41 10*3/uL — ABNORMAL HIGH (ref 4.0–10.5)
nRBC: 0 % (ref 0.0–0.2)

## 2019-05-28 LAB — FERRITIN: Ferritin: 360 ng/mL — ABNORMAL HIGH (ref 24–336)

## 2019-05-28 LAB — COMPREHENSIVE METABOLIC PANEL
ALT: 12 U/L (ref 0–44)
AST: 22 U/L (ref 15–41)
Albumin: 1.9 g/dL — ABNORMAL LOW (ref 3.5–5.0)
Alkaline Phosphatase: 45 U/L (ref 38–126)
Anion gap: 8 (ref 5–15)
BUN: 28 mg/dL — ABNORMAL HIGH (ref 8–23)
CO2: 19 mmol/L — ABNORMAL LOW (ref 22–32)
Calcium: 7.4 mg/dL — ABNORMAL LOW (ref 8.9–10.3)
Chloride: 112 mmol/L — ABNORMAL HIGH (ref 98–111)
Creatinine, Ser: 1.29 mg/dL — ABNORMAL HIGH (ref 0.61–1.24)
GFR calc Af Amer: >60 mL/min (ref >60)
GFR calc non Af Amer: 54 mL/min — ABNORMAL LOW (ref >60)
Glucose, Bld: 164 mg/dL — ABNORMAL HIGH (ref 70–99)
Potassium: 4.4 mmol/L (ref 3.5–5.1)
Sodium: 139 mmol/L (ref 135–145)
Total Bilirubin: 0.7 mg/dL (ref 0.3–1.2)
Total Protein: 3.7 g/dL — ABNORMAL LOW (ref 6.5–8.1)

## 2019-05-28 LAB — GLUCOSE, CAPILLARY
Glucose-Capillary: 142 mg/dL — ABNORMAL HIGH (ref 70–99)
Glucose-Capillary: 153 mg/dL — ABNORMAL HIGH (ref 70–99)
Glucose-Capillary: 112 mg/dL — ABNORMAL HIGH (ref 70–99)
Glucose-Capillary: 194 mg/dL — ABNORMAL HIGH (ref 70–99)

## 2019-05-28 LAB — LACTATE DEHYDROGENASE: LDH: 282 U/L — ABNORMAL HIGH (ref 98–192)

## 2019-05-28 LAB — D-DIMER, QUANTITATIVE: D-Dimer, Quant: >20 ug/mL-FEU — ABNORMAL HIGH (ref 0.00–0.50)

## 2019-05-28 LAB — FIBRINOGEN: Fibrinogen: 100 mg/dL — ABNORMAL LOW (ref 210–475)

## 2019-05-28 LAB — C-REACTIVE PROTEIN: CRP: 0.6 mg/dL (ref <1.0)

## 2019-05-28 MED ORDER — DEXAMETHASONE 6 MG PO TABS: 6.0000 mg | ORAL_TABLET | Freq: Every day | ORAL | 0 refills | Status: AC

## 2019-05-28 MED ORDER — ASCORBIC ACID 500 MG PO TABS: 500.0000 mg | ORAL_TABLET | Freq: Every day | ORAL | Status: DC

## 2019-05-28 MED ORDER — AMOXICILLIN-POT CLAVULANATE 875-125 MG PO TABS: 1.0000 | ORAL_TABLET | Freq: Two times a day (BID) | ORAL | 0 refills | Status: AC

## 2019-05-28 MED ORDER — SODIUM BICARBONATE 325 MG PO TABS: 325.0000 mg | ORAL_TABLET | Freq: Two times a day (BID) | ORAL | Status: DC

## 2019-05-28 MED ORDER — ZINC SULFATE 220 (50 ZN) MG PO CAPS: 220.0000 mg | ORAL_CAPSULE | Freq: Every day | ORAL | Status: DC

## 2019-05-28 NOTE — Progress Notes (Signed)
Physical Therapy Treatment Patient Details Name: [name] MRN: [id number] DOB: 02/11/1946 Today's Date: 05/28/2019    History of Present Illness 73 y.o. male with past medical history significant for CLL, CAD, BPH and HFrEF who was apparently not feeling very well for the past 4 to 5 days.   Patient was noted to have bilateral lower lobe pneumonias and significant hypoxia with 75% O2 sats on room air.  Patient was given nasal cannula 4 L oxygen with improvement to O2 sats in the 90s with decreased work of breathing.   Covid test subsequently came back positive. admitted for Covid versus community-acquired pneumonia. Pt with GI bleed 12/16.     PT Comments    Pt supine in bed with [place] off SaO2 in low 90s. Pt agreeable to getting up with therapy to walk and brush teeth at sink. Pt severely limited in safe mobility by oxygen desaturation with mobility (see General Comments) in presence of decreased strength, balance and endurance. Pt is min guard for bed mobility and min A for transfers and ambulation with RW. Discharge plans remain appropriate at this time. PT will continue to follow acutely.      Follow Up Recommendations  SNF     Equipment Recommendations  Rolling walker with 5" wheels       Precautions / Restrictions Precautions Precautions: Fall Precaution Comments: monitor sats Restrictions Weight Bearing Restrictions: No    Mobility  Bed Mobility Overal bed mobility: Needs Assistance Bed Mobility: Sit to Supine       Sit to supine: Min guard   General bed mobility comments: Assist for lines  Transfers Overall transfer level: Needs assistance Equipment used: Rolling walker (2 wheeled) Transfers: Sit to/from Stand Sit to Stand: Min assist;Min guard         General transfer comment: min A for steadying in standing from bed, min guard from straight back chair with arm rests, maximal vc for hand placement for power up   Ambulation/Gait Ambulation/Gait  assistance: Min assist;+2 physical assistance Gait Distance (Feet): 8 Feet(8'x2 to sink and back ) Assistive device: Rolling walker (2 wheeled) Gait Pattern/deviations: Step-through pattern;Decreased step length - right;Decreased step length - left;Shuffle;Trunk flexed Gait velocity: decr Gait velocity interpretation: <1.31 ft/sec, indicative of household ambulator General Gait Details: Assist for balance and support.        Balance Overall balance assessment: Needs assistance Sitting-balance support: Feet supported Sitting balance-Leahy Scale: Good     Standing balance support: Bilateral upper extremity supported;During functional activity Standing balance-Leahy Scale: Poor Standing balance comment: walker and supervision or no UE support and min assist for static standinb                            Cognition Arousal/Alertness: Awake/alert Behavior During Therapy: WFL for tasks assessed/performed Overall Cognitive Status: Impaired/Different from baseline Area of Impairment: Memory;Following commands;Safety/judgement;Problem solving;Attention                   Current Attention Level: Sustained Memory: Decreased short-term memory Following Commands: Follows one step commands consistently     Problem Solving: Requires verbal cues;Slow processing General Comments: pt continues to need multimodal cues for task completion and mobility          General Comments General comments (skin integrity, edema, etc.): On entry pt in supine with SaO2 90%O2 on RA , with coming to seated EoB despite cues for pursed lip breathing, 3L supplemental O2 via [place] provided  and pt able to recover to 88%O2, with standing SaO2 dropped again. O2 increased to 4L for ambulation from bed to sink. With ambulation max HR increased to 123 bpm and SaO2 dropped to 72%O2. Pt requires seated rest break for 2 minutes with cues for pursed lipped breathing to recover, with ambulation back to bed again  SaO2 dropped to 75%O2 and HR max 130 bpm, supplemental O2 increased to 6 L. After approximately 7 min pt eventually able to recover to 88%O2 . O2 sensor applied to earlobe instead of finger SaO2 reading 92%O2. with back to supine pt able to maintain SaO2 in low 90s on 4L O2      Pertinent Vitals/Pain Pain Assessment: No/denies pain           PT Goals (current goals can now be found in the care plan section) Acute Rehab PT Goals PT Goal Formulation: Patient unable to participate in goal setting Time For Goal Achievement: 06/01/19 Potential to Achieve Goals: Good Progress towards PT goals: Progressing toward goals    Frequency    Min 2X/week      PT Plan Current plan remains appropriate    Co-evaluation PT/OT/SLP Co-Evaluation/Treatment: Yes Reason for Co-Treatment: Complexity of the patient's impairments (multi-system involvement)(tolerance to multiple sessions)          AM-PAC PT "6 Clicks" Mobility   Outcome Measure  Help needed turning from your back to your side while in a flat bed without using bedrails?: None Help needed moving from lying on your back to sitting on the side of a flat bed without using bedrails?: A Little Help needed moving to and from a bed to a chair (including a wheelchair)?: A Little Help needed standing up from a chair using your arms (e.g., wheelchair or bedside chair)?: A Little Help needed to walk in hospital room?: A Little Help needed climbing 3-5 steps with a railing? : Total 6 Click Score: 17    End of Session Equipment Utilized During Treatment: Gait belt;Oxygen Activity Tolerance: Patient tolerated treatment well Patient left: in bed;with bed alarm set;with call bell/phone within reach Nurse Communication: Mobility status PT Visit Diagnosis: Unsteadiness on feet (R26.81);Other abnormalities of gait and mobility (R26.89);Muscle weakness (generalized) (M62.81);Difficulty in walking, not elsewhere classified (R26.2)     Time:  1324-1410 PT Time Calculation (min) (ACUTE ONLY): 46 min  Charges:  $Gait Training: 8-22 mins                     Connelly [place] B. Migdalia Dk PT, DPT Acute Rehabilitation Services Pager [phone] Office [phone]    [place] 05/28/2019, 4:09 PM

## 2019-05-28 NOTE — Progress Notes (Addendum)
Occupational Therapy Treatment Patient Details Name: [name] MRN: [id number] DOB: 11-02-1945 Today's Date: 05/28/2019    History of present illness 73 y.o. male with past medical history significant for CLL, CAD, BPH and HFrEF who was apparently not feeling very well for the past 4 to 5 days.   Patient was noted to have bilateral lower lobe pneumonias and significant hypoxia with 75% O2 sats on room air.  Patient was given nasal cannula 4 L oxygen with improvement to O2 sats in the 90s with decreased work of breathing.   Covid test subsequently came back positive. admitted for Covid versus community-acquired pneumonia. Pt with GI bleed 12/16.    OT comments  Pt making gradual progress towards OT goals, he continues to present with weakness, cognitive deficits, decreased activity tolerance and impaired respiratory status with activity. Pt very pleasant and willing to work with therapies this date. Pt overall requiring minA (+2 safety/equipment) for room level mobility using RW; minA for seated grooming ADL. Pt with delayed processing and decreased awareness requiring increased cues for safety and sequencing steps with RW use. Pt also with difficulty following commands for pursed lip breathing. Pt initially on RA upon entry with SpO2 90%. 2L initially applied with bed mobility with SpO2 88%, use of 4L with mobility to sink initially with lowest SpO2 noted 72%, HR up to 123. Give seated rest and cues for breathing pt able to recover to mid-upper 80s. With mobility back to bed O2 sats again decreased to 75% with max HR noted 130. Ultimately required 6L for O2 sats to increase to 80s, and within approx 7 min returning to >90% (overall O2 measured via finger probe, switched to probe on earlobe end of session and SpO2 92%). Able to titrate back to 4L end of session with SpO2 >90%. Continue to recommend SNF level therapies at time of discharge. Will continue per POC.     Follow Up Recommendations  SNF;Supervision/Assistance - 24 hour    Equipment Recommendations  Other (comment)(defer to next venue)          Precautions / Restrictions Precautions Precautions: Fall Precaution Comments: monitor sats Restrictions Weight Bearing Restrictions: No       Mobility Bed Mobility Overal bed mobility: Needs Assistance Bed Mobility: Sit to Supine;Supine to Sit     Supine to sit: Min guard Sit to supine: Min guard   General bed mobility comments: Assist for lines  Transfers Overall transfer level: Needs assistance Equipment used: Rolling walker (2 wheeled) Transfers: Sit to/from Stand Sit to Stand: Min assist;Min guard         General transfer comment: min A for steadying in standing from bed, min guard from straight back chair with arm rests, maximal vc for hand placement for power up     Balance Overall balance assessment: Needs assistance Sitting-balance support: Feet supported Sitting balance-Leahy Scale: Good     Standing balance support: Bilateral upper extremity supported;During functional activity Standing balance-Leahy Scale: Poor Standing balance comment: walker and supervision or no UE support and min assist for static standinb                           ADL either performed or assessed with clinical judgement   ADL Overall ADL's : Needs assistance/impaired     Grooming: Min guard;Minimal assistance;Cueing for safety;Cueing for compensatory techniques;Sitting  Functional mobility during ADLs: Minimal assistance;+2 for safety/equipment;Rolling walker General ADL Comments: pt remains with decreased endurance and poor respiratory status with activity     Vision       Perception     Praxis      Cognition Arousal/Alertness: Awake/alert Behavior During Therapy: WFL for tasks assessed/performed Overall Cognitive Status: Impaired/Different from baseline Area of Impairment: Memory;Following  commands;Safety/judgement;Problem solving;Attention                   Current Attention Level: Sustained Memory: Decreased short-term memory Following Commands: Follows one step commands consistently     Problem Solving: Requires verbal cues;Slow processing General Comments: pt continues to need multimodal cues for task completion and mobility         Exercises     Shoulder Instructions       General Comments On entry pt in supine with SaO2 90%O2 on RA , with coming to seated EoB despite cues for pursed lip breathing, 3L supplemental O2 via [place] provided and pt able to recover to 88%O2, with standing SaO2 dropped again. O2 increased to 4L for ambulation from bed to sink. With ambulation max HR increased to 123 bpm and SaO2 dropped to 72%O2. Pt requires seated rest break for 2 minutes with cues for pursed lipped breathing to recover, with ambulation back to bed again SaO2 dropped to 75%O2 and HR max 130 bpm, supplemental O2 increased to 6 L. After approximately 7 min pt eventually able to recover to 88%O2 . O2 sensor applied to earlobe instead of finger SaO2 reading 92%O2. with back to supine pt able to maintain SaO2 in low 90s on 4L O2    Pertinent Vitals/ Pain       Pain Assessment: No/denies pain  Home Living                                          Prior Functioning/Environment              Frequency  Min 2X/week        Progress Toward Goals  OT Goals(current goals can now be found in the care plan section)  Progress towards OT goals: Progressing toward goals  Acute Rehab OT Goals Patient Stated Goal: home when able OT Goal Formulation: With patient Time For Goal Achievement: 06/01/19 Potential to Achieve Goals: Good ADL Goals Pt Will Perform Grooming: with min guard assist;standing Pt Will Perform Lower Body Bathing: with min assist;sitting/lateral leans;sit to/from stand Pt Will Perform Lower Body Dressing: with min  assist;sitting/lateral leans;sit to/from stand Pt Will Transfer to Toilet: with min assist;ambulating;bedside commode Pt Will Perform Tub/Shower Transfer: with min assist;shower seat;ambulating;rolling walker Additional ADL Goal #1: Pt will sequence through simple table top BADL task with <3 VC's for successful completion  Plan Discharge plan remains appropriate;Frequency remains appropriate    Co-evaluation    PT/OT/SLP Co-Evaluation/Treatment: Yes Reason for Co-Treatment: Complexity of the patient's impairments (multi-system involvement)(decreased tolerance to multiple sessions)   OT goals addressed during session: ADL's and self-care      AM-PAC OT "6 Clicks" Daily Activity     Outcome Measure   Help from another person eating meals?: A Little Help from another person taking care of personal grooming?: A Little Help from another person toileting, which includes using toliet, bedpan, or urinal?: A Lot Help from another person bathing (including washing, rinsing, drying)?: A Lot Help  from another person to put on and taking off regular upper body clothing?: A Little Help from another person to put on and taking off regular lower body clothing?: A Lot 6 Click Score: 15    End of Session Equipment Utilized During Treatment: Gait belt;Rolling walker;Oxygen  OT Visit Diagnosis: Unsteadiness on feet (R26.81);Other abnormalities of gait and mobility (R26.89);History of falling (Z91.81);Muscle weakness (generalized) (M62.81);Other symptoms and signs involving cognitive function   Activity Tolerance Patient tolerated treatment well   Patient Left in bed;with call bell/phone within reach;with bed alarm set   Nurse Communication Mobility status        Time: 1324-1410 OT Time Calculation (min): 46 min  Charges: OT General Charges $OT Visit: 1 Visit OT Treatments $Self Care/Home Management : 23-37 mins  Lou Cal, OT Supplemental Rehabilitation Services Pager  [phone] Office [phone]   Raymondo Band 05/28/2019, 5:03 PM

## 2019-05-28 NOTE — TOC Progression Note (Addendum)
Transition of Care [facility]) - Progression Note    Patient Details  Name: [name] MRN: [id number] Date of Birth: 07/20/1945  [facility] CM/SW [place], LCSW Phone Number: 05/28/2019, 9:46 AM  Clinical Narrative:    CSW checking with [facility] on bed availability and insurance approval. CSW spoke with patient's spouse. She is concerned with patient returning home because she is not able to help him until he is stronger. She is hopeful staff can convince him to go to SNF for rehab first because she feels the patient will not listen if she tells him to go.     Expected Discharge Plan: [place]    Expected Discharge Plan and Services Expected Discharge Plan: [place] arrangements for the past 2 months: Single Family Home                                       Social Determinants of Health (SDOH) Interventions    Readmission Risk Interventions Readmission Risk Prevention Plan 05/17/2019  Transportation Screening Complete  HRI or [place] (No Data)  Some recent data might be hidden

## 2019-05-28 NOTE — Care Management Important Message (Signed)
Important Message  Patient Details  Name: [name] MRN: [id number] Date of Birth: [birth year]   Medicare Important Message Given:  Yes - Important Message mailed due to current National Emergency  Verbal consent obtained due to current National Emergency  Relationship to patient: Spouse/Significant Other Contact Name: Macguire Conquest Call Date: 05/28/19  Time: [place] Phone: [phone] Outcome: Spoke with contact Important Message mailed to: Patient address on file    Delorse Lek 05/28/2019, 4:46 PM

## 2019-05-28 NOTE — Discharge Summary (Signed)
Physician Discharge Summary  [name] [id number] DOB: 1945-11-12 DOA: 05/15/2019  PCP: [name], MD  Admit date: 05/15/2019 Discharge date: 05/28/2019  Time spent: 45 minutes  Recommendations for Outpatient Follow-up:  1. Note carefully that Plavix can be resumed 12/29, aspirin can be resumed 12/27 2. No anticoagulation otherwise given GI bleed presumed secondary to diverticulosis this admission 3. Complete outpatient course of Decadron as per instructions as well as Unasyn for aspiration pneumonia 4. Prior to admission was not on medication for blood sugars suggest use of Metformin 500 daily if sugars are probably 180 5. Will need outpatient follow-up for CLL 6. Needed oxygen 3 to 4 L at baseline please ensure the same 7. Consider retrieval of IVC filter in about 6 months once gastrointestinal healing has occurred  Discharge Diagnoses:  Principal Problem:   Pneumonia due to COVID-19 virus Active Problems:   CLL (chronic lymphocytic leukemia) (HCC)   BPH (benign prostatic hyperplasia)   CAD S/P PCI-- RCA DES 2014,  CFX DES Jan 2017   PTSD (post-traumatic stress disorder)   Benign essential HTN   Ischemic cardiomyopathy   GERD (gastroesophageal reflux disease)   Esophageal dysphagia   Sepsis ([place])   Community acquired pneumonia   COVID-19   Acute respiratory failure with hypoxia ([place])   Gastrointestinal hemorrhage associated with intestinal diverticulosis   Discharge Condition: Fair somewhat guarded  Diet recommendation: Heart healthy  Filed Weights   05/25/19 0500 05/26/19 0451 05/27/19 0515  Weight: 76 kg 73.2 kg 73.6 kg    History of present illness:  73 WM CAD PCI 2014 to RCA, STEMI 1.12.17-status post DES-on Brilinta, prior admission 06/05/2013 diverticulitis in the setting of aspirin and Effient, repeat admission 06/04/2014 hematochezia 2/2 diverticulitis--colonoscopy 9/19 multiple diverticulosis of the sigmoid descending colon History of CLL,  BPH, GERD, dysphagia dysphagia, HFrEF (40-45% 2017) Initial admit date 05/15/2019 respiratory failure secondary to coronavirus-developed hematochezia hypotension 12/17 critical care consulted--placed transiently on pressors, started octreotide, bleeding scan performed placed on Protonix GI consulted saw the patient did not recommend any procedure given hemodynamic stability  but gave recommendations  Assessment & Plan: Coronavirus 19 infection with pneumonia on admission hypoxia Completed course of remdesivir 12/15 Given steroid initially, higher dose 120 now--stop date potentially 12/24 with p.o. steroids Inflammatory markers unremarkable Aspiration pneumonia Requiring more oxygen probably secondary to right upper lobe pneumonia Started on Unasyn will complete this as outpatient as Augmentin Elevated D-dimer D-dimer 12/21 elevated up to 15.9-duplex ultrasound shows indeterminant DVT bilateral femoral popliteal as well as right tibial veins so patient cannot get anticoagulated IVC filter placed by IR 12/21 retrievable Painless hematochezia secondary to likely diverticulosis bleeding scan 12/17 negative for active bleed Dark stools continue however-hemoglobin up to 9.4 Octreotide discontinued, p.o. pantoprazole 40 daily Appears to be on heart healthy diet at this time Diabetes mellitus type 2 A1c this admission 6.8 Continue sliding scale alone--note not on meds prior to admission-sugars moderately well controlled this admission and was likely secondary to steroids-May require small dose of Metformin in the outpatient setting  CAD history, PCI 2014, stent 2017 HFrEF EF 40-45% 2017 Per GI aspirin can be resumed 12/27, Plavix can be resumed 12/29 Holding lisinopril 5 at this time-not on beta-blocker presumably secondary to hypotension earlier in admission History CLL Needs outpatient follow-up white count is high may need further work-up Prior history of dysphagia Outpatient  scope?   Procedures:  IVC filter placement 12/21 for DVT  DVT found on ultrasound 12/21  CVL placed 12/17  Consultations:  GI  IR  Discharge Exam: Vitals:   05/28/19 1240 05/28/19 1507  BP:    Pulse:  71  Resp:  17  Temp: 98.1 F (36.7 C)   SpO2:  97%    General: Awake alert coherent oxygen requirement seems to be less than 3 L range verbalizing fairly well seems less frustrated Cardiovascular: S1-S2 no murmur rub or gallop Respiratory: Clinically clear no added sound no rales no rhonchi Abdomen soft nontender no rebound no guarding Neurologically intact  Discharge Instructions   Discharge Instructions    Diet - low sodium heart healthy   Complete by: As directed    Increase activity slowly   Complete by: As directed      Allergies as of 05/28/2019   No Known Allergies     Medication List    STOP taking these medications   aspirin EC 81 MG tablet   clopidogrel 75 MG tablet Commonly known as: PLAVIX   furosemide 20 MG tablet Commonly known as: LASIX   lisinopril 5 MG tablet Commonly known as: ZESTRIL   phenazopyridine 200 MG tablet Commonly known as: PYRIDIUM     TAKE these medications   amoxicillin-clavulanate 875-125 MG tablet Commonly known as: Augmentin Take 1 tablet by mouth 2 (two) times daily for 5 days.   ascorbic acid 500 MG tablet Commonly known as: VITAMIN C Take 1 tablet (500 mg total) by mouth daily. Start taking on: May 29, 2019   cholecalciferol 1000 units tablet Commonly known as: VITAMIN D Take 1,000 Units by mouth once a week.   dexamethasone 6 MG tablet Commonly known as: DECADRON Take 1 tablet (6 mg total) by mouth daily for 4 days.   pantoprazole 40 MG tablet Commonly known as: PROTONIX TAKE ONE TABLET BY MOUTH 30 MINUTES BEFORE A MEAL ONCE OR TWICE DAILY What changed: See the new instructions.   simvastatin 40 MG tablet Commonly known as: ZOCOR TAKE 1 TABLET BY MOUTH EVERY DAY What changed: when to  take this   sodium bicarbonate 325 MG tablet Take 1 tablet (325 mg total) by mouth 2 (two) times daily.   tamsulosin 0.4 MG Caps capsule Commonly known as: FLOMAX Take 1 capsule (0.4 mg total) by mouth daily after breakfast.   zinc sulfate 220 (50 Zn) MG capsule Take 1 capsule (220 mg total) by mouth daily. Start taking on: May 29, 2019            Durable Medical Equipment  (From admission, onward)         Start     Ordered   05/20/19 1741  For home use only DME Walker rolling  Once    Comments: 5" wheels  Question:  Patient needs a walker to treat with the following condition  Answer:  Ambulatory dysfunction   05/20/19 1740         No Known Allergies Contact information for after-discharge care    [facility] .   Service: Skilled Nursing Contact information: [address] [phone]               The results of significant diagnostics from this hospitalization (including imaging, microbiology, ancillary and laboratory) are listed below for reference.    Significant Diagnostic Studies: am PNA cxr  Result Date: 05/27/2019 CLINICAL DATA:  Pneumonia EXAM: CHEST  1 VIEW COMPARISON:  May 23, 2019. FINDINGS: Ill-defined airspace opacity remains in the right upper lobe.  There is subtle airspace opacity in each lung base, slightly increased bilaterally. There is a minimal left pleural effusion. Heart is upper normal in size with pulmonary vascularity normal. No adenopathy. Interval removal of central catheter without pneumothorax. No bone lesions. IMPRESSION: Areas of ill-defined airspace opacity in the right upper lobe, stable, as well as in the lung bases, slightly increased. Stable cardiac silhouette. No adenopathy appreciable. Minimal left pleural effusion present. Electronically Signed   By: Lowella Grip III M.D.   On: 05/27/2019 07:12   CT Head Wo Contrast  Result Date:  05/15/2019 CLINICAL DATA:  Trauma EXAM: CT HEAD WITHOUT CONTRAST CT CERVICAL SPINE WITHOUT CONTRAST TECHNIQUE: Multidetector CT imaging of the head and cervical spine was performed following the standard protocol without intravenous contrast. Multiplanar CT image reconstructions of the cervical spine were also generated. COMPARISON:  None. FINDINGS: CT HEAD FINDINGS Brain: There is no acute intracranial hemorrhage, mass effect or edema. Gray-white differentiation is preserved. Ventricles and sulci are within limits in size and configuration. Patchy and confluent hypoattenuation in the supratentorial white matter is nonspecific but probably reflects moderate chronic microvascular ischemic changes. There is no extra-axial fluid collection. Vascular: There is intracranial atherosclerotic calcification at the skull base. Skull: Unremarkable. Sinuses/Orbits: Moderate ethmoid mucosal thickening. Orbits are unremarkable. Other: Mild patchy mastoid opacification CT CERVICAL SPINE FINDINGS Alignment: Anteroposterior alignment is maintained. Skull base and vertebrae: No acute fracture. Vertebral body heights are preserved. Soft tissues and spinal canal: No prevertebral fluid or swelling. No visible canal hematoma. Disc levels: Mild degenerative changes are present without high-grade osseous encroachment on the spinal canal. Upper chest: Partially imaged right upper lobe opacity. Other: None IMPRESSION: No evidence of acute intracranial injury or acute cervical spine fracture. Moderate chronic microvascular ischemic changes. Partially imaged right upper lobe opacity. Electronically Signed   By: Macy Mis M.D.   On: 05/15/2019 16:45   CT ANGIO CHEST PE W OR WO CONTRAST  Result Date: 05/19/2019 CLINICAL DATA:  Sepsis, fever, tachypnea, tachycardia, leukocytosis, lower lobe pneumonia, COVID-19 positive, elevated D-dimer EXAM: CT ANGIOGRAPHY CHEST WITH CONTRAST TECHNIQUE: Multidetector CT imaging of the chest was  performed using the standard protocol during bolus administration of intravenous contrast. Multiplanar CT image reconstructions and MIPs were obtained to evaluate the vascular anatomy. CONTRAST:  50mL OMNIPAQUE IOHEXOL 350 MG/ML SOLN COMPARISON:  Same day chest radiograph FINDINGS: Cardiovascular: Satisfactory opacification of the pulmonary arteries to the segmental level. No evidence of pulmonary embolism. Cardiomegaly. Three-vessel coronary artery calcifications. No pericardial effusion. Mediastinum/Nodes: Enlarged mediastinal and hilar lymph nodes. Thyroid gland, trachea, and esophagus demonstrate no significant findings. Lungs/Pleura: There is extensive, predominantly dependent ground-glass and consolidation of the lungs. Trace bilateral pleural effusions. Upper Abdomen: No acute abnormality. Musculoskeletal: No chest wall abnormality. No acute or significant osseous findings. Review of the MIP images confirms the above findings. IMPRESSION: 1. Negative examination for pulmonary embolism. 2. Extensive, predominantly dependent ground-glass and consolidation of the lungs, with trace bilateral pleural effusions. Findings are consistent with multifocal infection and in keeping with reported diagnosis of COVID-19 pneumonia. 3. Enlarged mediastinal and hilar lymph nodes, likely reactive. 4. Coronary artery disease. Electronically Signed   By: Eddie Candle M.D.   On: 05/19/2019 14:46   CT Cervical Spine Wo Contrast  Result Date: 05/15/2019 CLINICAL DATA:  Trauma EXAM: CT HEAD WITHOUT CONTRAST CT CERVICAL SPINE WITHOUT CONTRAST TECHNIQUE: Multidetector CT imaging of the head and cervical spine was performed following the standard protocol without intravenous contrast. Multiplanar CT image reconstructions of the  cervical spine were also generated. COMPARISON:  None. FINDINGS: CT HEAD FINDINGS Brain: There is no acute intracranial hemorrhage, mass effect or edema. Gray-white differentiation is preserved. Ventricles  and sulci are within limits in size and configuration. Patchy and confluent hypoattenuation in the supratentorial white matter is nonspecific but probably reflects moderate chronic microvascular ischemic changes. There is no extra-axial fluid collection. Vascular: There is intracranial atherosclerotic calcification at the skull base. Skull: Unremarkable. Sinuses/Orbits: Moderate ethmoid mucosal thickening. Orbits are unremarkable. Other: Mild patchy mastoid opacification CT CERVICAL SPINE FINDINGS Alignment: Anteroposterior alignment is maintained. Skull base and vertebrae: No acute fracture. Vertebral body heights are preserved. Soft tissues and spinal canal: No prevertebral fluid or swelling. No visible canal hematoma. Disc levels: Mild degenerative changes are present without high-grade osseous encroachment on the spinal canal. Upper chest: Partially imaged right upper lobe opacity. Other: None IMPRESSION: No evidence of acute intracranial injury or acute cervical spine fracture. Moderate chronic microvascular ischemic changes. Partially imaged right upper lobe opacity. Electronically Signed   By: Macy Mis M.D.   On: 05/15/2019 16:45   NM GI Blood Loss  Result Date: 05/23/2019 CLINICAL DATA:  Hematochezia and hypotension earlier this morning. EXAM: NUCLEAR MEDICINE GASTROINTESTINAL BLEEDING SCAN TECHNIQUE: Sequential abdominal images were obtained following intravenous administration of Tc-1m labeled red blood cells. RADIOPHARMACEUTICALS:  26.5 mCi Tc-30m pertechnetate in-vitro labeled red cells. COMPARISON:  None. FINDINGS: No abnormal focus of activity is demonstrated in the bowel to suggest a gastrointestinal bleed. Normal physiologic biodistribution of the radiotracer is demonstrated throughout the abdomen. IMPRESSION: Negative. Electronically Signed   By: Titus Dubin M.D.   On: 05/23/2019 19:28   IR IVC FILTER PLMT / S&I Burke Keels GUID/MOD SED  Result Date: 05/27/2019 INDICATION: 73 year old  male currently admitted with COVID pneumonia. Patient also experienced hematochezia from presumed lower GI bleed during this hospital admission necessitating cessation of his anticoagulation. Laboratory evaluation demonstrated and elevated D-dimer and bilateral lower extremity venous duplex showed positive bilateral age indeterminate DVT. He currently cannot be anticoagulated secondary to continuing to pass heme-positive stools. Therefore, he is a candidate for temporary caval interruption for PE prophylaxis. EXAM: ULTRASOUND GUIDANCE FOR VASCULARACCESS IVC CATHETERIZATION AND VENOGRAM IVC FILTER INSERTION Interventional Radiologist:  Criselda Peaches, MD MEDICATIONS: None. ANESTHESIA/SEDATION: No sedation. 50 mcg fentanyl administered for pain control. FLUOROSCOPY TIME:  Fluoroscopy Time: 0 minutes 36 seconds (19 mGy). COMPLICATIONS: None immediate. PROCEDURE: Informed written consent was obtained from the patient after a thorough discussion of the procedural risks, benefits and alternatives. All questions were addressed. Maximal Sterile Barrier Technique was utilized including caps, mask, sterile gowns, sterile gloves, sterile drape, hand hygiene and skin antiseptic. A timeout was performed prior to the initiation of the procedure. Maximal barrier sterile technique utilized including caps, mask, sterile gowns, sterile gloves, large sterile drape, hand hygiene, and Betadine prep. Under sterile condition and local anesthesia, right internal jugular venous access was performed with ultrasound. An ultrasound image was saved and sent to PACS. Over a guidewire, the IVC filter delivery sheath and inner dilator were advanced into the IVC just above the IVC bifurcation. Contrast injection was performed for an IVC venogram. Through the delivery sheath, a retrievable Denali IVC filter was deployed below the level of the renal veins and above the IVC bifurcation. Limited post deployment venacavagram was performed. The  delivery sheath was removed and hemostasis was obtained with manual compression. A dressing was placed. The patient tolerated the procedure well without immediate post procedural complication. FINDINGS: The IVC is patent.  No evidence of thrombus, stenosis, or occlusion. No variant venous anatomy. Successful placement of the IVC filter below the level of the renal veins. IMPRESSION: Successful ultrasound and fluoroscopically guided placement of an infrarenal retrievable IVC filter via right jugular approach. PLAN: This IVC filter is potentially retrievable. The patient will be assessed for filter retrieval by Interventional Radiology in approximately 8-12 weeks. Further recommendations regarding filter retrieval, continued surveillance or declaration of device permanence, will be made at that time. Electronically Signed   By: Jacqulynn Cadet M.D.   On: 05/27/2019 18:31   DG Pelvis Portable  Result Date: 05/15/2019 CLINICAL DATA:  Cough, sepsis, UTI EXAM: PORTABLE PELVIS 1-2 VIEWS COMPARISON:  Portable exam 1408 hours without priors for comparison FINDINGS: Osseous demineralization. Hip and SI joint spaces preserved. No acute fracture, dislocation, or bone destruction. Scattered atherosclerotic calcifications at the femoral arteries. IMPRESSION: No acute abnormalities. Electronically Signed   By: Lavonia Dana M.D.   On: 05/15/2019 14:27   DG CHEST PORT 1 VIEW  Result Date: 05/23/2019 CLINICAL DATA:  Central line placement.  COVID-19 positive. EXAM: PORTABLE CHEST 1 VIEW COMPARISON:  Radiograph and CT 05/19/2019 FINDINGS: Right internal jugular central venous catheter tip at the atrial caval junction. No pneumothorax. Bilateral heterogeneous airspace opacities with improvement on the left from prior exam. Right lung opacities are not significantly changed. Unchanged heart size and mediastinal contours. No large pleural effusion. IMPRESSION: 1. Right internal jugular central venous catheter tip at the atrial  caval junction. No pneumothorax. 2. Bilateral pneumonia with improvement on the left from prior exam. Electronically Signed   By: Keith Rake M.D.   On: 05/23/2019 06:58   DG CHEST PORT 1 VIEW  Result Date: 05/19/2019 CLINICAL DATA:  73 year old male positive COVID-19. Shortness of breath and chest pain. EXAM: PORTABLE CHEST 1 VIEW COMPARISON:  Portable chest 05/15/2019 and earlier. FINDINGS: Portable AP semi upright view at 0805 hours. Increasing asymmetric bilateral pulmonary interstitial opacity more pronounced on the left. Stable lung volumes. Mediastinal contours remain normal. Visualized tracheal air column is within normal limits. No pneumothorax or pleural effusion identified. Stable visualized osseous structures. Negative visible bowel gas pattern. IMPRESSION: Radiographically progressed COVID-19 pneumonia since 05/15/2019, greater on the left. Electronically Signed   By: Genevie Ann M.D.   On: 05/19/2019 12:18   DG Chest Port 1 View  Result Date: 05/15/2019 CLINICAL DATA:  Cough, sepsis, UTI, coronary disease post MI, ischemic cardiomyopathy, CLL, hypertension EXAM: PORTABLE CHEST 1 VIEW COMPARISON:  Portable exam 1410 hours compared to 04/05/2018 FINDINGS: Normal heart size and mediastinal contours. Minimal patchy opacities in the mid to lower lungs question subtle infiltrates. No pleural effusion or pneumothorax. Bones demineralized. IMPRESSION: Patchy opacities in the mid to lower lungs bilaterally suspicious for subtle pulmonary infiltrates. Electronically Signed   By: Lavonia Dana M.D.   On: 05/15/2019 14:26   VAS [place] LOWER EXTREMITY VENOUS (DVT)  Result Date: 05/27/2019  Lower Venous Study Indications: Covid-19, Elevated D-Dimer.  Limitations: Body habitus and postioning, confusion. Comparison Study: No prior study on file for comparison Performing Technologist: Sharion Dove RVS  Examination Guidelines: A complete evaluation includes B-mode imaging, spectral Doppler, color Doppler, and  power Doppler as needed of all accessible portions of each vessel. Bilateral testing is considered an integral part of a complete examination. Limited examinations for reoccurring indications may be performed as noted.  +---------+---------------+---------+-----------+----------+-----------------+ RIGHT    CompressibilityPhasicitySpontaneityPropertiesThrombus Aging    +---------+---------------+---------+-----------+----------+-----------------+ CFV      Full  Yes      Yes                                    +---------+---------------+---------+-----------+----------+-----------------+ SFJ      Full                                                           +---------+---------------+---------+-----------+----------+-----------------+ FV Prox  Full                                         Age Indeterminate +---------+---------------+---------+-----------+----------+-----------------+ FV Mid   Partial                                      Age Indeterminate +---------+---------------+---------+-----------+----------+-----------------+ FV DistalPartial                                      Age Indeterminate +---------+---------------+---------+-----------+----------+-----------------+ PFV      Full                                                           +---------+---------------+---------+-----------+----------+-----------------+ POP      Partial                                      Age Indeterminate +---------+---------------+---------+-----------+----------+-----------------+ PTV      None                                         Age Indeterminate +---------+---------------+---------+-----------+----------+-----------------+   +---------+---------------+---------+-----------+----------+-----------------+ LEFT     CompressibilityPhasicitySpontaneityPropertiesThrombus Aging     +---------+---------------+---------+-----------+----------+-----------------+ CFV      Full           Yes      Yes                                    +---------+---------------+---------+-----------+----------+-----------------+ SFJ      Full                                                           +---------+---------------+---------+-----------+----------+-----------------+ FV Prox  Partial                                      Age Indeterminate +---------+---------------+---------+-----------+----------+-----------------+ FV Mid   Partial  Age Indeterminate +---------+---------------+---------+-----------+----------+-----------------+ FV DistalPartial                                      Age Indeterminate +---------+---------------+---------+-----------+----------+-----------------+ PFV      Full                                                           +---------+---------------+---------+-----------+----------+-----------------+ POP      Partial        No       No                   Age Indeterminate +---------+---------------+---------+-----------+----------+-----------------+ PTV      Full                                                           +---------+---------------+---------+-----------+----------+-----------------+ PERO     None                                         Age Indeterminate +---------+---------------+---------+-----------+----------+-----------------+    Summary: Right: Findings consistent with age indeterminate deep vein thrombosis involving the right femoral vein, right popliteal vein, and right posterior tibial veins. Left: Findings consistent with age indeterminate deep vein thrombosis involving the left femoral vein, left popliteal vein, and left peroneal veins.  *See table(s) above for measurements and observations. Electronically signed by Curt Jews MD on 05/27/2019 at 5:41:26 PM.     Final     Microbiology: Recent Results (from the past 240 hour(s))  MRSA PCR Screening     Status: None   Collection Time: 05/21/19  5:25 AM   Specimen: Nasal Mucosa; Nasopharyngeal  Result Value Ref Range Status   MRSA by PCR NEGATIVE NEGATIVE Final    Comment:        The GeneXpert MRSA Assay (FDA approved for NASAL specimens only), is one component of a comprehensive MRSA colonization surveillance program. It is not intended to diagnose MRSA infection nor to guide or monitor treatment for MRSA infections. Performed at [facility], [place] [address], [place], [place] [address]      Labs: Basic Metabolic Panel: Recent Labs  Lab 05/24/19 0338 05/25/19 0426 05/26/19 0356 05/27/19 0310 05/28/19 0500  NA 141 142 139 138 139  K 4.5 4.6 4.3 4.4 4.4  CL 114* 116* 115* 115* 112*  CO2 21* 20* 20* 16* 19*  GLUCOSE 192* 152* 154* 172* 164*  BUN 38* 35* 32* 32* 28*  CREATININE 1.29* 1.30* 1.18 1.10 1.29*  CALCIUM 7.0* 7.4* 7.2* 7.3* 7.4*  MG 1.9 2.1  --   --   --   PHOS 4.2 3.3  --   --   --    Liver Function Tests: Recent Labs  Lab 05/23/19 0156 05/24/19 0338 05/26/19 0356 05/27/19 0310 05/28/19 0500  AST 22 19 21 23 22   ALT 14 12 13 12 12   ALKPHOS 57 40 40 45 45  BILITOT 0.8 1.0  0.8 1.0 0.7  PROT 4.5* 3.7* 3.8* 4.0* 3.7*  ALBUMIN 2.0* 1.8* 1.8* 1.8* 1.9*   No results for input(s): LIPASE, AMYLASE in the last 168 hours. No results for input(s): AMMONIA in the last 168 hours. CBC: Recent Labs  Lab 05/22/19 0358 05/24/19 0338 05/25/19 0426 05/26/19 0356 05/27/19 0310 05/28/19 0500  WBC 44.8* 32.8* 41.1* 39.0* 41.7* 41.0*  NEUTROABS 5.3  --   --  9.8* 10.6* 10.5*  HGB 11.6* 8.7* 9.3* 8.5* 9.4* 9.2*  HCT 35.3* 26.1* 29.1* 26.6* 28.6* 29.4*  MCV 90.5 92.2 94.2 94.0 92.9 95.8  PLT 284 151 178 166 152 140*   Cardiac Enzymes: No results for input(s): CKTOTAL, CKMB, CKMBINDEX, TROPONINI in the last 168 hours. BNP: BNP (last 3 results) Recent Labs     05/16/19 0737  BNP 191.5*    ProBNP (last 3 results) No results for input(s): PROBNP in the last 8760 hours.  CBG: Recent Labs  Lab 05/27/19 1138 05/27/19 1606 05/27/19 2201 05/28/19 0758 05/28/19 1238  GLUCAP 171* 148* 216* 142* 153*       Signed:  Nita Sells MD   [facility] 05/28/2019, 3:29 PM

## 2019-05-29 LAB — GLUCOSE, CAPILLARY
Glucose-Capillary: 135 mg/dL — ABNORMAL HIGH (ref 70–99)
Glucose-Capillary: 176 mg/dL — ABNORMAL HIGH (ref 70–99)
Glucose-Capillary: 140 mg/dL — ABNORMAL HIGH (ref 70–99)

## 2019-05-29 LAB — FERRITIN: Ferritin: 339 ng/mL — ABNORMAL HIGH (ref 24–336)

## 2019-05-29 LAB — LACTATE DEHYDROGENASE: LDH: 256 U/L — ABNORMAL HIGH (ref 98–192)

## 2019-05-29 LAB — C-REACTIVE PROTEIN: CRP: <0.5 mg/dL (ref <1.0)

## 2019-05-29 LAB — FIBRINOGEN: Fibrinogen: 109 mg/dL — ABNORMAL LOW (ref 210–475)

## 2019-05-29 LAB — D-DIMER, QUANTITATIVE: D-Dimer, Quant: 15 ug/mL-FEU — ABNORMAL HIGH (ref 0.00–0.50)

## 2019-05-29 NOTE — Progress Notes (Signed)
Patient seen and examined this a.m. no acute distress and resting comfortably.    Vitals:   05/29/19 0734 05/29/19 1138  BP: 127/64 132/68  Pulse: 70   Resp: 19 14  Temp: 97.7 F (36.5 C) 98.3 F (36.8 C)  SpO2: 91%      Physical Exam Vitals and nursing note reviewed.  Constitutional:      Appearance: Normal appearance.  HENT:     Head: Normocephalic and atraumatic.     Nose: Nose normal.     Mouth/Throat:     Mouth: Mucous membranes are moist.  Eyes:     Extraocular Movements: Extraocular movements intact.  Cardiovascular:     Rate and Rhythm: Normal rate and regular rhythm.  Pulmonary:     Effort: Pulmonary effort is normal.     Breath sounds: Normal breath sounds.  Abdominal:     General: Abdomen is flat.     Palpations: Abdomen is soft.  Musculoskeletal:        General: No swelling. Normal range of motion.     Cervical back: Normal range of motion. No rigidity.  Neurological:     General: No focal deficit present.     Mental Status: He is alert. Mental status is at baseline.  Psychiatric:        Mood and Affect: Mood normal.        Behavior: Behavior normal.    Agree with Dr. Arlyss Queen discharge summary from 12/22.  No changes at this time.  Patient able to be discharged to SNF today.  Consider adding back patient's home Lasix if pressure stays stable.  Marva Panda, DO

## 2019-05-29 NOTE — Progress Notes (Signed)
Patient discharged via [facility] to [facility] with paperwork.

## 2019-05-29 NOTE — TOC Transition Note (Deleted)
Transition of Care [facility]) - CM/SW Discharge Note   Patient Details  Name: [name] MRN: [id number] Date of Birth: [birth year]  Transition of Care [facility]) CM/SW Contact:  Benard Halsted, LCSW Phone Number: 05/29/2019, 3:20 PM   Clinical Narrative:       Final next level of care: Skilled Nursing Facility Barriers to Discharge: No Barriers Identified   Patient Goals and CMS Choice Patient states their goals for this hospitalization and ongoing recovery are:: Get stronger CMS Medicare.gov Compare Post Acute Care list provided to:: Patient Represenative (must comment)(Spouse) Choice offered to / list presented to : Spouse  Discharge Placement   Existing PASRR number confirmed : 05/29/19          Patient chooses bed at: [facility] Patient to be transferred to facility by: [place] Name of family member notified: Spouse, [name] Patient and family notified of of transfer: 05/29/19  Discharge Plan and Services In-house Referral: Clinical Social Work   Post Acute Care Choice: [place]                               Social Determinants of Health (SDOH) Interventions     Readmission Risk Interventions Readmission Risk Prevention Plan 05/17/2019  Transportation Screening Complete  HRI or [place] (No Data)  Some recent data might be hidden

## 2019-05-29 NOTE — Plan of Care (Signed)
Problem: Education: Goal: Knowledge of General Education information will improve Description: Including pain rating scale, medication(s)/side effects and non-pharmacologic comfort measures 05/29/2019 2008 by Toni Amend, RN Outcome: Adequate for Discharge 05/29/2019 2007 by Toni Amend, RN Outcome: Adequate for Discharge   Problem: Health Behavior/Discharge Planning: Goal: Ability to manage health-related needs will improve 05/29/2019 2008 by Toni Amend, RN Outcome: Adequate for Discharge 05/29/2019 2007 by Toni Amend, RN Outcome: Adequate for Discharge   Problem: Clinical Measurements: Goal: Ability to maintain clinical measurements within normal limits will improve 05/29/2019 2008 by Toni Amend, RN Outcome: Adequate for Discharge 05/29/2019 2007 by Toni Amend, RN Outcome: Adequate for Discharge Goal: Will remain free from infection 05/29/2019 2008 by Toni Amend, RN Outcome: Adequate for Discharge 05/29/2019 2007 by Toni Amend, RN Outcome: Adequate for Discharge Goal: Diagnostic test results will improve 05/29/2019 2008 by Toni Amend, RN Outcome: Adequate for Discharge 05/29/2019 2007 by Toni Amend, RN Outcome: Adequate for Discharge Goal: Respiratory complications will improve 05/29/2019 2008 by Toni Amend, RN Outcome: Adequate for Discharge 05/29/2019 2007 by Toni Amend, RN Outcome: Adequate for Discharge Goal: Cardiovascular complication will be avoided 05/29/2019 2008 by Toni Amend, RN Outcome: Adequate for Discharge 05/29/2019 2007 by Toni Amend, RN Outcome: Adequate for Discharge   Problem: Activity: Goal: Risk for activity intolerance will decrease 05/29/2019 2008 by Toni Amend, RN Outcome: Adequate for Discharge 05/29/2019 2007 by Toni Amend, RN Outcome: Adequate for Discharge   Problem: Nutrition: Goal: Adequate nutrition will be  maintained 05/29/2019 2008 by Toni Amend, RN Outcome: Adequate for Discharge 05/29/2019 2007 by Toni Amend, RN Outcome: Adequate for Discharge   Problem: Coping: Goal: Level of anxiety will decrease 05/29/2019 2008 by Toni Amend, RN Outcome: Adequate for Discharge 05/29/2019 2007 by Toni Amend, RN Outcome: Adequate for Discharge   Problem: Elimination: Goal: Will not experience complications related to bowel motility 05/29/2019 2008 by Toni Amend, RN Outcome: Adequate for Discharge 05/29/2019 2007 by Toni Amend, RN Outcome: Adequate for Discharge Goal: Will not experience complications related to urinary retention 05/29/2019 2008 by Toni Amend, RN Outcome: Adequate for Discharge 05/29/2019 2007 by Toni Amend, RN Outcome: Adequate for Discharge   Problem: Safety: Goal: Ability to remain free from injury will improve 05/29/2019 2008 by Toni Amend, RN Outcome: Adequate for Discharge 05/29/2019 2007 by Toni Amend, RN Outcome: Adequate for Discharge   Problem: Skin Integrity: Goal: Risk for impaired skin integrity will decrease 05/29/2019 2008 by Toni Amend, RN Outcome: Adequate for Discharge 05/29/2019 2007 by Toni Amend, RN Outcome: Adequate for Discharge   Problem: Education: Goal: Knowledge of risk factors and measures for prevention of condition will improve 05/29/2019 2008 by Toni Amend, RN Outcome: Adequate for Discharge 05/29/2019 2007 by Toni Amend, RN Outcome: Adequate for Discharge   Problem: Coping: Goal: Psychosocial and spiritual needs will be supported 05/29/2019 2008 by Toni Amend, RN Outcome: Adequate for Discharge 05/29/2019 2007 by Toni Amend, RN Outcome: Adequate for Discharge   Problem: Respiratory: Goal: Will maintain a patent airway 05/29/2019 2008 by Toni Amend, RN Outcome: Adequate for Discharge 05/29/2019 2007 by Toni Amend, RN Outcome: Adequate for Discharge Goal: Complications related to the disease process, condition or treatment will be avoided or minimized 05/29/2019 2008 by Toni Amend, RN Outcome: Adequate for Discharge 05/29/2019 2007 by Toni Amend,  RN Outcome: Adequate for Discharge   Problem: Education: Goal: Ability to demonstrate management of disease process will improve 05/29/2019 2008 by Toni Amend, RN Outcome: Adequate for Discharge 05/29/2019 2007 by Toni Amend, RN Outcome: Adequate for Discharge Goal: Ability to verbalize understanding of medication therapies will improve 05/29/2019 2008 by Toni Amend, RN Outcome: Adequate for Discharge 05/29/2019 2007 by Toni Amend, RN Outcome: Adequate for Discharge Goal: Individualized Educational Video(s) 05/29/2019 2008 by Toni Amend, RN Outcome: Adequate for Discharge 05/29/2019 2007 by Toni Amend, RN Outcome: Adequate for Discharge   Problem: Activity: Goal: Capacity to carry out activities will improve 05/29/2019 2008 by Toni Amend, RN Outcome: Adequate for Discharge 05/29/2019 2007 by Toni Amend, RN Outcome: Adequate for Discharge   Problem: Cardiac: Goal: Ability to achieve and maintain adequate cardiopulmonary perfusion will improve 05/29/2019 2008 by Toni Amend, RN Outcome: Adequate for Discharge 05/29/2019 2007 by Toni Amend, RN Outcome: Adequate for Discharge

## 2019-05-29 NOTE — TOC Transition Note (Addendum)
Transition of Care [facility]) - CM/SW Discharge Note   Patient Details  Name: [name] MRN: [id number] Date of Birth: August 08, 1945  Transition of Care [facility]) CM/SW Contact:  Benard Halsted, LCSW Phone Number: 05/29/2019, 3:21 PM   Clinical Narrative:    Patient will DC to: [facility] Anticipated DC date: 05/29/19 Family notified: Spouse, [name] Transport by: [name]   Per MD patient ready for DC to [facility] (they have insurance approval). RN, patient, patient's family, and facility notified of DC. Discharge Summary and FL2 sent to facility. RN to call report prior to discharge [phone]). DC packet on chart. Ambulance transport requested for patient.   CSW will sign off for now as social work intervention is no longer needed. Please consult [place] again if new needs arise.  [name], LCSW Clinical Social Worker [phone]    Final next level of care: Skilled Nursing Facility Barriers to Discharge: No Barriers Identified   Patient Goals and CMS Choice Patient states their goals for this hospitalization and ongoing recovery are:: Get stronger CMS Medicare.gov Compare Post Acute Care list provided to:: Patient Represenative (must comment)(Spouse) Choice offered to / list presented to : Spouse  Discharge Placement   Existing PASRR number confirmed : 05/29/19          Patient [facility] bed at: [facility] Patient to be transferred to facility by: [place] Name of family member notified: Spouse, [name] Patient and family notified of of transfer: 05/29/19  Discharge Plan and Services In-house Referral: Clinical Social Work   Post Acute Care Choice: [place]                               Social Determinants of Health (SDOH) Interventions     Readmission Risk Interventions Readmission Risk Prevention Plan 05/17/2019  Transportation Screening Complete  HRI or [place] (No Data)  Some recent data might be hidden

## 2019-06-07 DIAGNOSIS — I82409 Acute embolism and thrombosis of unspecified deep veins of unspecified lower extremity: Secondary | ICD-10-CM

## 2019-06-07 HISTORY — DX: Acute embolism and thrombosis of unspecified deep veins of unspecified lower extremity: I82.409

## 2019-06-13 ENCOUNTER — Other Ambulatory Visit: Payer: Self-pay | Admitting: Gastroenterology

## 2019-06-23 ENCOUNTER — Other Ambulatory Visit: Payer: Self-pay | Admitting: Cardiology

## 2019-06-24 ENCOUNTER — Other Ambulatory Visit (INDEPENDENT_AMBULATORY_CARE_PROVIDER_SITE_OTHER): Payer: Self-pay | Admitting: Internal Medicine

## 2019-06-24 ENCOUNTER — Other Ambulatory Visit: Payer: Self-pay | Admitting: Gastroenterology

## 2019-06-27 ENCOUNTER — Telehealth (INDEPENDENT_AMBULATORY_CARE_PROVIDER_SITE_OTHER): Payer: Self-pay | Admitting: Nurse Practitioner

## 2019-06-27 NOTE — Telephone Encounter (Signed)
Please call this patient to schedule him for a follow-up. He was recently discharged from a SNF following admission to the hospital for [place]. He needs to be reestablished here now that he has been sent home. Thank you.

## 2019-07-07 ENCOUNTER — Other Ambulatory Visit (INDEPENDENT_AMBULATORY_CARE_PROVIDER_SITE_OTHER): Payer: Self-pay | Admitting: Internal Medicine

## 2019-07-09 ENCOUNTER — Ambulatory Visit (INDEPENDENT_AMBULATORY_CARE_PROVIDER_SITE_OTHER): Payer: Medicare HMO | Admitting: Nurse Practitioner

## 2019-07-09 ENCOUNTER — Encounter (INDEPENDENT_AMBULATORY_CARE_PROVIDER_SITE_OTHER): Payer: Self-pay | Admitting: Nurse Practitioner

## 2019-07-09 ENCOUNTER — Other Ambulatory Visit: Payer: Self-pay

## 2019-07-09 VITALS — BP 110/78 | HR 88 | Temp 97.8°F | Resp 19 | Ht 69.0 in | Wt 122.0 lb

## 2019-07-09 DIAGNOSIS — R531 Weakness: Secondary | ICD-10-CM | POA: Diagnosis not present

## 2019-07-09 DIAGNOSIS — N401 Enlarged prostate with lower urinary tract symptoms: Secondary | ICD-10-CM

## 2019-07-09 DIAGNOSIS — C9110 Chronic lymphocytic leukemia of B-cell type not having achieved remission: Secondary | ICD-10-CM

## 2019-07-09 DIAGNOSIS — R413 Other amnesia: Secondary | ICD-10-CM | POA: Diagnosis not present

## 2019-07-09 DIAGNOSIS — R5383 Other fatigue: Secondary | ICD-10-CM

## 2019-07-09 DIAGNOSIS — R21 Rash and other nonspecific skin eruption: Secondary | ICD-10-CM

## 2019-07-09 DIAGNOSIS — U071 COVID-19: Secondary | ICD-10-CM

## 2019-07-09 DIAGNOSIS — J1282 Pneumonia due to coronavirus disease 2019: Secondary | ICD-10-CM

## 2019-07-09 HISTORY — DX: Other amnesia: R41.3

## 2019-07-09 MED ORDER — TAMSULOSIN HCL 0.4 MG PO CAPS: 0.4000 mg | ORAL_CAPSULE | Freq: Every day | ORAL | 0 refills | Status: DC

## 2019-07-09 MED ORDER — NYSTATIN 100000 UNIT/GM EX POWD: 1.0000 "application " | Freq: Three times a day (TID) | CUTANEOUS | 0 refills | Status: DC

## 2019-07-09 NOTE — Assessment & Plan Note (Signed)
Will prescribe nystatin powder to be applied 3 times a day.  Wife was told to call office if rash is not improving within 2 weeks.

## 2019-07-09 NOTE — Assessment & Plan Note (Signed)
Very well related to recent hospitalization, but will collect blood work for further evaluation.

## 2019-07-09 NOTE — Assessment & Plan Note (Signed)
We will refer patient to oncology for further evaluation.

## 2019-07-09 NOTE — Patient Instructions (Signed)
Thank you for choosing [facility] as your medical provider! If you have any questions or concerns regarding your health care, please do not hesitate to call our office.  I am collecting blood work for further evaluation.  If we need to make any changes to medications or make other recommendations I will either send a letter to your home or have my medical assistant call you to discuss this.  Continue current medications as prescribed for now.  I have also ordered home health physical therapy for further management of patient's weakness.  If you do not hear from a home health agency to schedule physical therapy within the next week or so please call my office and let us know.  I have also prescribed powder that he can apply to the rash on his groin 3 times a day.  If the rash is not improving within the next 2 weeks please call this office so that I can prescribe a different medication.  Please call us with any questions or concerns prior to next appointment.  Please follow-up as scheduled in 1 month. We look forward to seeing you again soon!   At [facility] we value your feedback. You may receive a survey about your visit today. Please share your experience as we strive to create trusting relationships with our patients to provide genuine, compassionate, quality care.  We appreciate your understanding and patience as we review any laboratory studies, imaging, and other diagnostic tests that are ordered as we care for you. We do our best to address any and all results in a timely manner. If you do not hear about test results within 1 week, please do not hesitate to contact us. If we referred you to a specialist during your visit or ordered imaging testing, contact the office if you have not been contacted to be scheduled within 1 weeks.  We also encourage the use of MyChart, which contains your medical information for your review as well. If you are not enrolled in this feature, an  access code is on this after visit summary for your convenience. Thank you for allowing [place] to be involved in your care.

## 2019-07-09 NOTE — Assessment & Plan Note (Signed)
Will order home health physical therapy for further evaluation and management.

## 2019-07-09 NOTE — Assessment & Plan Note (Signed)
Wife requesting refill on patient's Flomax.  Refill sent.

## 2019-07-09 NOTE — Assessment & Plan Note (Addendum)
Patient appears to be improving.  He continues on portable oxygen.  I encouraged him to continue using his oxygen.  We will consider referring him for removal of IVC filter in approximately 4 months.  We will collect blood work today for further evaluation including CBC and CMP

## 2019-07-09 NOTE — Progress Notes (Signed)
Subjective:  Patient ID: [name], male    DOB: [birth year]  Age: 74 y.o. MRN: [id number]  CC:  Chief Complaint  Patient presents with  . Follow-up  . post hosp/snf  stay  . Extremity Weakness      HPI  This patient presents today with his wife.  He was recently released from a skilled nursing facility after hospitalization for COVID-19 infection.  He continues to be quite deconditioned and wife is concerned regarding his weakness.  Patient is now in a wheelchair.  Wife and patient deny any falls since discharge.  Patient remains on oxygen.  They tell me that physical therapy was supposed to start working with the patient however they have not come out to see him.  They tell me that they were discharged from the skilled nursing facility back on June 24, 2019.  The wife is also concerned because she tells me the patient's memory has worsened since being discharged from the hospital.  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  Discharge summary from hospital recommends trying to remove IVC filter in approximately 6 months.  This will be due to be attempted in June 2021.  Also has CLL and discharge summary recommends follow-up for this in the outpatient basis.  I do not see where he has seen oncology for this since discharge.  Wife also mentions the patient has a rash in the groin that is not improving.   Past Medical History:  Diagnosis Date  . Acute MI, inferolateral wall, initial episode of care ([place]) 06/16/15   99% dCx   . BPH (benign prostatic hyperplasia) 04/08/2011  . CAD S/P percutaneous coronary angioplasty 11/23/12; 06/16/15   a. 2 overlapping mRCA lesions - Xience Xpedition DES 2.75 mm x 18 mm (3.0 mm);; b. Inf-Lat STEMI 06/16/2015 - dCx Asp Thrombectomy & PCI 3.0 x 15 Xience drDES (3.6 mm)  . Chronic headache   . CLL (chronic lymphocytic leukemia) ([place]) 04/08/2011  . Depression   . Diverticulitis   . Gallstone   .  GERD (gastroesophageal reflux disease)   . Hypercholesteremia   . Ischemic cardiomyopathy    Echo 06/18/2015 EF 35-40% after lateral MI -> repeat echo 08/06/2015: EF 40-45% with basal-mid inferolateral akinesis and hypokinesis of the lateral wall.  . NSTEMI (non-ST elevated myocardial infarction) ([place]) 11/23/12   NSTEMI 11/23/2012 RCA lesion - PCI with DES; moderate LAD disease  . PTSD (post-traumatic stress disorder)    With Depression - since MI      Family History  Problem Relation Age of Onset  . Diabetes Mother   . Cancer Father   . Colon cancer Neg Hx     Social History   Social History Narrative   Exercises 3 to 4 days a week at the fitness center for about a half an hour at a time.   "former smoker". Does not drink EtOH   Married.   Social History   Tobacco Use  . Smoking status: Former Smoker    Packs/day: 0.25    Years: 2.00    Pack years: 0.50    Types: Cigarettes    Quit date: 06/07/1963    Years since quitting: 56.1  . Smokeless tobacco: Never Used  Substance Use Topics  . Alcohol use: No    Alcohol/week: 0.0 standard drinks    Comment: Occasionally drinks red wine     Current Meds  Medication Sig  .  ascorbic acid (VITAMIN C) 500 MG tablet Take 1 tablet (500 mg total) by mouth daily.  . cholecalciferol (VITAMIN D) 1000 units tablet Take 1,000 Units by mouth once a week.  . clopidogrel (PLAVIX) 75 MG tablet Take 1 tablet (75 mg total) by mouth daily.  Marland Kitchen lisinopril (ZESTRIL) 5 MG tablet TAKE 1 TABLET BY MOUTH EVERY DAY  . pantoprazole (PROTONIX) 40 MG tablet TAKE 1 TABLET BY MOUTH 30 MINUTES BEFORE A MEAL ONCE OR TWICE DAILY  . simvastatin (ZOCOR) 40 MG tablet Take 1 tablet (40 mg total) by mouth daily at 6 PM.  . sodium bicarbonate 325 MG tablet Take 1 tablet (325 mg total) by mouth 2 (two) times daily.  . tamsulosin (FLOMAX) 0.4 MG CAPS capsule Take 1 capsule (0.4 mg total) by mouth daily after breakfast.  . zinc sulfate 220 (50 Zn) MG capsule Take 1  capsule (220 mg total) by mouth daily.    ROS:  Review of Systems  Constitutional: Positive for malaise/fatigue. Negative for fever.  Respiratory: Positive for shortness of breath.   Musculoskeletal: Negative for falls.  Neurological: Positive for weakness.     Objective:   Today's Vitals: BP 110/78 (BP Location: Right Arm, Patient Position: Sitting, Cuff Size: Normal)   Pulse 88   Temp 97.8 F (36.6 C) (Temporal)   Resp 19 Comment: portable oxygen 2L  Ht 5\' 9"  (1.753 m)   Wt 122 lb (55.3 kg) Comment: post COVID weight  SpO2 98% Comment: @2L  oxygen  BMI 18.02 kg/m  Vitals with BMI 07/09/2019 05/29/2019 05/29/2019  Height 5\' 9"  - -  Weight 122 lbs - -  BMI 123456 - -  Systolic [id number]  Diastolic 78 68 64  Pulse 88 - 70     Physical Exam Vitals reviewed.  Constitutional:      Appearance: Normal appearance.  HENT:     Head: Normocephalic and atraumatic.  Cardiovascular:     Rate and Rhythm: Normal rate and regular rhythm.  Pulmonary:     Effort: Pulmonary effort is normal.     Breath sounds: Normal breath sounds.     Comments: On portable oxygen Musculoskeletal:     Cervical back: Neck supple.  Skin:    General: Skin is warm and dry.       Neurological:     Mental Status: He is alert and oriented to person, place, and time.     Motor: Weakness (to bilateral upper and lower extremities) present.  Psychiatric:        Mood and Affect: Mood normal.        Behavior: Behavior normal.        Thought Content: Thought content normal.        Judgment: Judgment normal.          Assessment   No diagnosis found.    Tests ordered No orders of the defined types were placed in this encounter.    Plan: Please see assessment and plan per problem list below.   No orders of the defined types were placed in this encounter.     Ailene Ards, NP

## 2019-07-10 LAB — COMPLETE METABOLIC PANEL WITH GFR
AG Ratio: 1.3 (calc) (ref 1.0–2.5)
ALT: 6 U/L — ABNORMAL LOW (ref 9–46)
AST: 17 U/L (ref 10–35)
Albumin: 3.1 g/dL — ABNORMAL LOW (ref 3.6–5.1)
Alkaline phosphatase (APISO): 67 U/L (ref 35–144)
BUN: 16 mg/dL (ref 7–25)
CO2: 25 mmol/L (ref 20–32)
Calcium: 8.7 mg/dL (ref 8.6–10.3)
Chloride: 107 mmol/L (ref 98–110)
Creat: 1.04 mg/dL (ref 0.70–1.18)
GFR, Est African American: 81 mL/min/{1.73_m2} (ref >=60)
GFR, Est Non African American: 70 mL/min/{1.73_m2} (ref >=60)
Globulin: 2.4 g/dL (calc) (ref 1.9–3.7)
Glucose, Bld: 113 mg/dL — ABNORMAL HIGH (ref 65–99)
Potassium: 4.5 mmol/L (ref 3.5–5.3)
Sodium: 141 mmol/L (ref 135–146)
Total Bilirubin: 0.4 mg/dL (ref 0.2–1.2)
Total Protein: 5.5 g/dL — ABNORMAL LOW (ref 6.1–8.1)

## 2019-07-10 LAB — CBC
HCT: 34.5 % — ABNORMAL LOW (ref 38.5–50.0)
Hemoglobin: 10.9 g/dL — ABNORMAL LOW (ref 13.2–17.1)
MCH: 29.5 pg (ref 27.0–33.0)
MCHC: 31.6 g/dL — ABNORMAL LOW (ref 32.0–36.0)
MCV: 93.2 fL (ref 80.0–100.0)
MPV: 11.4 fL (ref 7.5–12.5)
Platelets: 263 10*3/uL (ref 140–400)
RBC: 3.7 10*6/uL — ABNORMAL LOW (ref 4.20–5.80)
RDW: 13.6 % (ref 11.0–15.0)
WBC: 12.2 10*3/uL — ABNORMAL HIGH (ref 3.8–10.8)

## 2019-07-10 LAB — TSH: TSH: 1.19 mIU/L (ref 0.40–4.50)

## 2019-07-10 LAB — T3, FREE: T3, Free: 3.5 pg/mL (ref 2.3–4.2)

## 2019-07-10 LAB — VITAMIN B12: Vitamin B-12: 294 pg/mL (ref 200–1100)

## 2019-07-10 LAB — T4, FREE: Free T4: 1.1 ng/dL (ref 0.8–1.8)

## 2019-07-10 NOTE — Progress Notes (Signed)
Yes, I decided I would discuss them with the patient in about a month when he returns for follow-up and maybe repeat/further investigate his anemia, however it is improving and he has been referred to oncology for management of his CLL. I wasn't going to make any other changes at this time, unless you see something that you think I should address now.

## 2019-07-15 ENCOUNTER — Other Ambulatory Visit (HOSPITAL_COMMUNITY): Payer: Self-pay

## 2019-07-15 DIAGNOSIS — C9110 Chronic lymphocytic leukemia of B-cell type not having achieved remission: Secondary | ICD-10-CM

## 2019-07-16 ENCOUNTER — Encounter (HOSPITAL_COMMUNITY): Payer: Self-pay | Admitting: Hematology

## 2019-07-16 ENCOUNTER — Inpatient Hospital Stay (HOSPITAL_COMMUNITY): Payer: Medicare HMO | Admitting: Hematology

## 2019-07-16 ENCOUNTER — Inpatient Hospital Stay (HOSPITAL_COMMUNITY): Payer: Medicare HMO | Attending: Hematology

## 2019-07-16 ENCOUNTER — Other Ambulatory Visit: Payer: Self-pay

## 2019-07-16 VITALS — BP 91/57 | HR 100 | Temp 97.7°F | Resp 17 | Wt 136.0 lb

## 2019-07-16 DIAGNOSIS — C9110 Chronic lymphocytic leukemia of B-cell type not having achieved remission: Secondary | ICD-10-CM

## 2019-07-16 DIAGNOSIS — Z79899 Other long term (current) drug therapy: Secondary | ICD-10-CM | POA: Insufficient documentation

## 2019-07-16 DIAGNOSIS — Z809 Family history of malignant neoplasm, unspecified: Secondary | ICD-10-CM | POA: Diagnosis not present

## 2019-07-16 DIAGNOSIS — D649 Anemia, unspecified: Secondary | ICD-10-CM | POA: Diagnosis not present

## 2019-07-16 DIAGNOSIS — Z87891 Personal history of nicotine dependence: Secondary | ICD-10-CM | POA: Diagnosis not present

## 2019-07-16 DIAGNOSIS — D509 Iron deficiency anemia, unspecified: Secondary | ICD-10-CM | POA: Diagnosis present

## 2019-07-16 DIAGNOSIS — I252 Old myocardial infarction: Secondary | ICD-10-CM | POA: Diagnosis not present

## 2019-07-16 DIAGNOSIS — Z86718 Personal history of other venous thrombosis and embolism: Secondary | ICD-10-CM | POA: Insufficient documentation

## 2019-07-16 DIAGNOSIS — Z833 Family history of diabetes mellitus: Secondary | ICD-10-CM | POA: Insufficient documentation

## 2019-07-16 LAB — IRON AND TIBC
Iron: 36 ug/dL — ABNORMAL LOW (ref 45–182)
Saturation Ratios: 15 % — ABNORMAL LOW (ref 17.9–39.5)
TIBC: 241 ug/dL — ABNORMAL LOW (ref 250–450)
UIBC: 205 ug/dL

## 2019-07-16 LAB — COMPREHENSIVE METABOLIC PANEL
ALT: 10 U/L (ref 0–44)
AST: 17 U/L (ref 15–41)
Albumin: 3 g/dL — ABNORMAL LOW (ref 3.5–5.0)
Alkaline Phosphatase: 70 U/L (ref 38–126)
Anion gap: 8 (ref 5–15)
BUN: 21 mg/dL (ref 8–23)
CO2: 25 mmol/L (ref 22–32)
Calcium: 8.6 mg/dL — ABNORMAL LOW (ref 8.9–10.3)
Chloride: 106 mmol/L (ref 98–111)
Creatinine, Ser: 1.21 mg/dL (ref 0.61–1.24)
GFR calc Af Amer: >60 mL/min (ref >60)
GFR calc non Af Amer: 58 mL/min — ABNORMAL LOW (ref >60)
Glucose, Bld: 145 mg/dL — ABNORMAL HIGH (ref 70–99)
Potassium: 3.8 mmol/L (ref 3.5–5.1)
Sodium: 139 mmol/L (ref 135–145)
Total Bilirubin: 0.5 mg/dL (ref 0.3–1.2)
Total Protein: 5.9 g/dL — ABNORMAL LOW (ref 6.5–8.1)

## 2019-07-16 LAB — CBC WITH DIFFERENTIAL/PLATELET
Abs Immature Granulocytes: 0.15 10*3/uL — ABNORMAL HIGH (ref 0.00–0.07)
Basophils Absolute: 0.1 10*3/uL (ref 0.0–0.1)
Basophils Relative: 0 %
Eosinophils Absolute: 0.3 10*3/uL (ref 0.0–0.5)
Eosinophils Relative: 3 %
HCT: 34.1 % — ABNORMAL LOW (ref 39.0–52.0)
Hemoglobin: 10.3 g/dL — ABNORMAL LOW (ref 13.0–17.0)
Immature Granulocytes: 1 %
Lymphocytes Relative: 43 %
Lymphs Abs: 4.8 10*3/uL — ABNORMAL HIGH (ref 0.7–4.0)
MCH: 29.6 pg (ref 26.0–34.0)
MCHC: 30.2 g/dL (ref 30.0–36.0)
MCV: 98 fL (ref 80.0–100.0)
Monocytes Absolute: 0.8 10*3/uL (ref 0.1–1.0)
Monocytes Relative: 7 %
Neutro Abs: 5.2 10*3/uL (ref 1.7–7.7)
Neutrophils Relative %: 46 %
Platelets: 318 10*3/uL (ref 150–400)
RBC: 3.48 MIL/uL — ABNORMAL LOW (ref 4.22–5.81)
RDW: 14.8 % (ref 11.5–15.5)
WBC: 11.2 10*3/uL — ABNORMAL HIGH (ref 4.0–10.5)
nRBC: 0 % (ref 0.0–0.2)

## 2019-07-16 LAB — LACTATE DEHYDROGENASE: LDH: 169 U/L (ref 98–192)

## 2019-07-16 LAB — FERRITIN: Ferritin: 300 ng/mL (ref 24–336)

## 2019-07-16 NOTE — Assessment & Plan Note (Signed)
1.  Stage I CLL: -Originally diagnosed in 2008, on watchful waiting since then. -Recent hospitalization from 05/15/2019 through 05/28/2019 with Covid. -Denies any fevers or night sweats.  He lost some weight while he was hospitalized but has gained back since discharged home. -No palpable adenopathy or splenomegaly today. -We reviewed CBC which showed white count 11.2.  Hemoglobin is 10.3.  Platelets are 318.  LDH is 149. -No indication for treatment of CLL at this time.  I will reevaluate him in 3 months.  2.  DVT: -He was diagnosed with a DVT during last hospitalization. -IVC filter was placed on 05/27/2019. -Anticoagulation was not started as he had GI bleed secondary to diverticulosis.  3.  Normocytic anemia: -Today hemoglobin is 10.3 with MCV 98. 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  Ferritin was 300 but percent saturation is 15. -I talked about the parenteral iron therapy with Feraheme.  We talked about adverse effects including allergic reactions and anaphylactic reactions. -He is agreeable for treatment.  We will schedule him for 2 weekly dose of Feraheme.

## 2019-07-16 NOTE — Patient Instructions (Addendum)
[place] at [facility] Discharge Instructions  You were seen today by Dr. Delton Coombes. He went over your recent lab results. He will schedule you for IV iron if your blood counts are low, we will call you schedule this if needed. He will see you back in 3 monthsa for labs and follow up.   Thank you for choosing [place] at [facility] to provide your oncology and hematology care.  To afford each patient quality time with our provider, please arrive at least 15 minutes before your scheduled appointment time.   If you have a lab appointment with the [place] please come in thru the  Main Entrance and check in at the main information desk  You need to re-schedule your appointment should you arrive 10 or more minutes late.  We strive to give you quality time with our providers, and arriving late affects you and other patients whose appointments are after yours.  Also, if you no show three or more times for appointments you may be dismissed from the clinic at the providers discretion.     Again, thank you for choosing [facility]  Our hope is that these requests will decrease the amount of time that you wait before being seen by our physicians.       _____________________________________________________________  Should you have questions after your visit to [facility], please contact our office at [phone] between the hours of 8:00 a.m. and 4:30 p.m.  Voicemails left after 4:00 p.m. will not be returned until the following business day.  For prescription refill requests, have your pharmacy contact our office and allow 72 hours.    Cancer Center Support Programs:   > Cancer Support Group  2nd Tuesday of the month 1pm-2pm, Journey Room

## 2019-07-16 NOTE — Progress Notes (Signed)
[name], [place] [address]   CLINIC:  Medical Oncology/Hematology  PCP:  [name], MD [place] [address] [phone]   REASON FOR VISIT: Follow-up for Chronic lymphocytic leukemia CLL  CURRENT THERAPY: Observation    INTERVAL HISTORY:  [name] 74 y.o. male seen for follow-up of CLL.  He was hospitalized from 05/15/2019 through 05/28/2019 for Covid infection.  He went to rehab and is currently at home.  He is walking with the help of walker.  He is awaiting home physical therapy.  He denies any bleeding per rectum or melena.  While he was hospitalized he had DVT.  Denies any chest pains or difficulty breathing.  He does report some dyspnea on exertion.  REVIEW OF SYSTEMS:  Review of Systems  Respiratory: Positive for shortness of breath.   Psychiatric/Behavioral: Positive for depression.  All other systems reviewed and are negative.    PAST MEDICAL/SURGICAL HISTORY:  Past Medical History:  Diagnosis Date  . Acute MI, inferolateral wall, initial episode of care ([place]) 06/16/15   99% dCx   . BPH (benign prostatic hyperplasia) 04/08/2011  . CAD S/P percutaneous coronary angioplasty 11/23/12; 06/16/15   a. 2 overlapping mRCA lesions - Xience Xpedition DES 2.75 mm x 18 mm (3.0 mm);; b. Inf-Lat STEMI 06/16/2015 - dCx Asp Thrombectomy & PCI 3.0 x 15 Xience drDES (3.6 mm)  . Chronic headache   . CLL (chronic lymphocytic leukemia) ([place]) 04/08/2011  . Depression   . Diverticulitis   . Gallstone   . GERD (gastroesophageal reflux disease)   . Hypercholesteremia   . Ischemic cardiomyopathy    Echo 06/18/2015 EF 35-40% after lateral MI -> repeat echo 08/06/2015: EF 40-45% with basal-mid inferolateral akinesis and hypokinesis of the lateral wall.  . NSTEMI (non-ST elevated myocardial infarction) ([place]) 11/23/12   NSTEMI 11/23/2012 RCA lesion - PCI with DES; moderate LAD disease  . PTSD (post-traumatic stress disorder)    With  Depression - since MI   Past Surgical History:  Procedure Laterality Date  . APPENDECTOMY  1968-70  . CARDIAC CATHETERIZATION N/A 06/16/2015   Procedure: Left Heart Cath and Coronary Angiography;  Surgeon: Jettie Booze, MD;  Location: [facility];  Service: Cardiovascular: 99% thrombotic dCx -> asp thrombectomy & PCI. RCA Stents Patent.   Marland Kitchen CARDIAC CATHETERIZATION N/A 06/16/2015   Procedure: Coronary Stent Intervention;  Surgeon: Jettie Booze, MD;  Location: [facility];  Service: Cardiovascular: PCI dCx = thrombectomy -> 3.0 x 15 Xience drug-eluting stent (3.6 mm)   . COLONOSCOPY  2008   [id number] diverticula, diminutive polyp in the cecum, s/p bx Normal rectum. Tubular adenoma  . COLONOSCOPY N/A 10/17/2012   Dr. Gala Romney: colonic diverticulosis, tubular adenoma, surveillance due May 2019  . COLONOSCOPY N/A 02/21/2018   Procedure: COLONOSCOPY;  Surgeon: Daneil Dolin, MD;  [facility];  Service: Endoscopy;  Laterality: N/A;  12:00  . CORONARY STENT PLACEMENT  11/23/12   Mid RCA -- Xience eXp - 2.75 mm x 18 mm DES (3.51mm) , Dr. Ellyn Hack  . IR IVC FILTER PLMT / S&I /IMG GUID/MOD SED  05/27/2019  . LEFT HEART CATHETERIZATION WITH CORONARY ANGIOGRAM N/A 11/23/2012   Procedure: LEFT HEART CATHETERIZATION WITH CORONARY ANGIOGRAM;  Surgeon: Leonie Man, MD;  Location: [facility] C[facility];  Service: Cardiovascular: NSTEMI: Culprit = mid RCA 95% & 75%; LAD ~40-50%, Small RI ~60%; EF ~60%, mild inf-basal HK     .  TRANSTHORACIC ECHOCARDIOGRAM  January 2017; March 2017   a. EF 35-40%. Entire inferior hypokinesis and akinesis of the basal and mid inferolateral and anterolateral /distal lateral walls;; b. EF 40 and 45%. Akinesis of basal-mid inferolateral wall. Hypokinesis of entire lateral wall.     SOCIAL HISTORY:  Social History   Socioeconomic History  . Marital status: Married    Spouse name: Not on file  . Number of children: Not on file  . Years of  education: Not on file  . Highest education level: Not on file  Occupational History  . Not on file  Tobacco Use  . Smoking status: Former Smoker    Packs/day: 0.25    Years: 2.00    Pack years: 0.50    Types: Cigarettes    Quit date: 06/07/1963    Years since quitting: 56.1  . Smokeless tobacco: Never Used  Substance and Sexual Activity  . Alcohol use: No    Alcohol/week: 0.0 standard drinks    Comment: Occasionally drinks red wine  . Drug use: No  . Sexual activity: Not on file  Other Topics Concern  . Not on file  Social History Narrative   Exercises 3 to 4 days a week at the fitness center for about a half an hour at a time.   "former smoker". Does not drink EtOH   Married.   Social Determinants of Health   Financial Resource Strain:   . Difficulty of Paying Living Expenses: Not on file  Food Insecurity:   . Worried About Charity fundraiser in the Last Year: Not on file  . Ran Out of Food in the Last Year: Not on file  Transportation Needs:   . Lack of Transportation (Medical): Not on file  . Lack of Transportation (Non-Medical): Not on file  Physical Activity:   . Days of Exercise per Week: Not on file  . Minutes of Exercise per Session: Not on file  Stress:   . Feeling of Stress : Not on file  Social Connections:   . Frequency of Communication with Friends and Family: Not on file  . Frequency of Social Gatherings with Friends and Family: Not on file  . Attends Religious Services: Not on file  . Active Member of Clubs or Organizations: Not on file  . Attends Archivist Meetings: Not on file  . Marital Status: Not on file  Intimate Partner Violence:   . Fear of Current or Ex-Partner: Not on file  . Emotionally Abused: Not on file  . Physically Abused: Not on file  . Sexually Abused: Not on file    FAMILY HISTORY:  Family History  Problem Relation Age of Onset  . Diabetes Mother   . Cancer Father   . Colon cancer Neg Hx     CURRENT  MEDICATIONS:  Outpatient Encounter Medications as of 07/16/2019  Medication Sig  . ascorbic acid (VITAMIN C) 500 MG tablet Take 1 tablet (500 mg total) by mouth daily.  . cholecalciferol (VITAMIN D) 1000 units tablet Take 1,000 Units by mouth once a week.  . clopidogrel (PLAVIX) 75 MG tablet Take 1 tablet (75 mg total) by mouth daily.  Marland Kitchen lisinopril (ZESTRIL) 5 MG tablet TAKE 1 TABLET BY MOUTH EVERY DAY  . pantoprazole (PROTONIX) 40 MG tablet TAKE 1 TABLET BY MOUTH 30 MINUTES BEFORE A MEAL ONCE OR TWICE DAILY  . simvastatin (ZOCOR) 40 MG tablet Take 1 tablet (40 mg total) by mouth daily at 6 PM.  .  sodium bicarbonate 325 MG tablet Take 1 tablet (325 mg total) by mouth 2 (two) times daily.  . tamsulosin (FLOMAX) 0.4 MG CAPS capsule Take 1 capsule (0.4 mg total) by mouth daily after breakfast.  . zinc sulfate 220 (50 Zn) MG capsule Take 1 capsule (220 mg total) by mouth daily.  Marland Kitchen nystatin (MYCOSTATIN/NYSTOP) powder Apply 1 application topically 3 (three) times daily. (Patient not taking: Reported on 07/16/2019)  . [DISCONTINUED] lisinopril (PRINIVIL,ZESTRIL) 2.5 MG tablet Take 1 tablet (2.5 mg total) by mouth daily.  . [DISCONTINUED] nitroGLYCERIN (NITROSTAT) 0.4 MG SL tablet Place 1 tablet (0.4 mg total) under the tongue every 5 (five) minutes as needed for chest pain. (Patient not taking: Reported on 05/14/2018)  . [DISCONTINUED] simvastatin (ZOCOR) 40 MG tablet Take 1 tablet (40 mg total) by mouth every other day. NEED OV.   No facility-administered encounter medications on file as of 07/16/2019.    ALLERGIES:  No Known Allergies   PHYSICAL EXAM:  ECOG Performance status: 1  Vitals:   07/16/19 1202  BP: (!) 91/57  Pulse: 100  Resp: 17  Temp: 97.7 F (36.5 C)  SpO2: 100%   Filed Weights   07/16/19 1202  Weight: 136 lb (61.7 kg)    Physical Exam Constitutional:      Appearance: Normal appearance. He is well-developed.  Cardiovascular:     Rate and Rhythm: Normal rate and regular  rhythm.     Heart sounds: Normal heart sounds.  Pulmonary:     Effort: Pulmonary effort is normal.     Breath sounds: Normal breath sounds.  Abdominal:     General: There is no distension.     Palpations: Abdomen is soft. There is no mass.  Musculoskeletal:        General: Normal range of motion.  Skin:    General: Skin is warm.  Neurological:     General: No focal deficit present.     Mental Status: He is alert and oriented to person, place, and time.  Psychiatric:        Mood and Affect: Mood normal.        Behavior: Behavior normal.   There is very mild cervical adenopathy.  No splenomegaly.   LABORATORY DATA:  I have reviewed the labs as listed.  CBC    Component Value Date/Time   WBC 11.2 (H) 07/16/2019 1116   RBC 3.48 (L) 07/16/2019 1116   HGB 10.3 (L) 07/16/2019 1116   HCT 34.1 (L) 07/16/2019 1116   PLT 318 07/16/2019 1116   MCV 98.0 07/16/2019 1116   MCH 29.6 07/16/2019 1116   MCHC 30.2 07/16/2019 1116   RDW 14.8 07/16/2019 1116   LYMPHSABS 4.8 (H) 07/16/2019 1116   MONOABS 0.8 07/16/2019 1116   EOSABS 0.3 07/16/2019 1116   BASOSABS 0.1 07/16/2019 1116   CMP Latest Ref Rng & Units 07/16/2019 07/09/2019 05/28/2019  Glucose 70 - 99 mg/dL 145(H) 113(H) 164(H)  BUN 8 - 23 mg/dL 21 16 28(H)  Creatinine 0.61 - 1.24 mg/dL 1.21 1.04 1.29(H)  Sodium 135 - 145 mmol/L 139 141 139  Potassium 3.5 - 5.1 mmol/L 3.8 4.5 4.4  Chloride 98 - 111 mmol/L 106 107 112(H)  CO2 22 - 32 mmol/L 25 25 19(L)  Calcium 8.9 - 10.3 mg/dL 8.6(L) 8.7 7.4(L)  Total Protein 6.5 - 8.1 g/dL 5.9(L) 5.5(L) 3.7(L)  Total Bilirubin 0.3 - 1.2 mg/dL 0.5 0.4 0.7  Alkaline Phos 38 - 126 U/L 70 - 45  AST 15 -  41 U/L 17 17 22   ALT 0 - 44 U/L 10 6(L) 12     I have independently reviewed CT angio of the chest from 05/19/2019 which was negative for pulmonary embolism.  Extensive groundglass consolidation of the lungs.  Enlarged mediastinal and hilar lymph nodes, likely reactive.    ASSESSMENT & PLAN:     CLL (chronic lymphocytic leukemia) ([place]) 1.  Stage I CLL: -Originally diagnosed in 2008, on watchful waiting since then. -Recent hospitalization from 05/15/2019 through 05/28/2019 with Covid. -Denies any fevers or night sweats.  He lost some weight while he was hospitalized but has gained back since discharged home. -No palpable adenopathy or splenomegaly today. -We reviewed CBC which showed white count 11.2.  Hemoglobin is 10.3.  Platelets are 318.  LDH is 149. -No indication for treatment of CLL at this time.  I will reevaluate him in 3 months.  2.  DVT: -He was diagnosed with a DVT during last hospitalization. -IVC filter was placed on 05/27/2019. -Anticoagulation was not started as he had GI bleed secondary to diverticulosis.  3.  Normocytic anemia: -Today hemoglobin is 10.3 with MCV 98. 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  Ferritin was 300 but percent saturation is 15. -I talked about the parenteral iron therapy with Feraheme.  We talked about adverse effects including allergic reactions and anaphylactic reactions. -He is agreeable for treatment.  We will schedule him for 2 weekly dose of Feraheme.         Orders placed this encounter:  Orders Placed This Encounter  Procedures  . Iron and TIBC  . Ferritin      Derek Jack, MD [place] [phone]

## 2019-07-17 ENCOUNTER — Other Ambulatory Visit (HOSPITAL_COMMUNITY): Payer: Self-pay | Admitting: *Deleted

## 2019-07-17 ENCOUNTER — Other Ambulatory Visit (INDEPENDENT_AMBULATORY_CARE_PROVIDER_SITE_OTHER): Payer: Self-pay

## 2019-07-17 ENCOUNTER — Other Ambulatory Visit (INDEPENDENT_AMBULATORY_CARE_PROVIDER_SITE_OTHER): Payer: Self-pay | Admitting: Internal Medicine

## 2019-07-17 DIAGNOSIS — D649 Anemia, unspecified: Secondary | ICD-10-CM

## 2019-07-17 DIAGNOSIS — C9110 Chronic lymphocytic leukemia of B-cell type not having achieved remission: Secondary | ICD-10-CM

## 2019-07-17 DIAGNOSIS — R531 Weakness: Secondary | ICD-10-CM

## 2019-07-17 NOTE — Progress Notes (Unsigned)
Pt was seen in office on 07/09/19 by Glynis Smiles, NP-C. Will need a PT order sent to [facility] for therapy. Post d/c  From hosp for weakness.

## 2019-07-22 ENCOUNTER — Other Ambulatory Visit: Payer: Self-pay | Admitting: Cardiology

## 2019-07-22 DIAGNOSIS — D500 Iron deficiency anemia secondary to blood loss (chronic): Secondary | ICD-10-CM | POA: Insufficient documentation

## 2019-07-22 HISTORY — DX: Iron deficiency anemia secondary to blood loss (chronic): D50.0

## 2019-07-23 ENCOUNTER — Encounter (HOSPITAL_COMMUNITY): Payer: Self-pay

## 2019-07-23 ENCOUNTER — Other Ambulatory Visit: Payer: Self-pay

## 2019-07-23 ENCOUNTER — Inpatient Hospital Stay (HOSPITAL_COMMUNITY): Payer: Medicare HMO

## 2019-07-23 VITALS — BP 109/53 | HR 73 | Temp 97.2°F | Resp 18

## 2019-07-23 DIAGNOSIS — D500 Iron deficiency anemia secondary to blood loss (chronic): Secondary | ICD-10-CM

## 2019-07-23 DIAGNOSIS — D509 Iron deficiency anemia, unspecified: Secondary | ICD-10-CM | POA: Diagnosis not present

## 2019-07-23 DIAGNOSIS — D649 Anemia, unspecified: Secondary | ICD-10-CM

## 2019-07-23 MED ORDER — SODIUM CHLORIDE 0.9 % IV SOLN: Freq: Once | INTRAVENOUS | Status: AC

## 2019-07-23 MED ORDER — SODIUM CHLORIDE 0.9 % IV SOLN
200.0000 mg | Freq: Once | INTRAVENOUS | Status: AC
  Administered 2019-07-23: 200 mg via INTRAVENOUS
  Filled 2019-07-23: qty 200

## 2019-07-23 NOTE — Progress Notes (Signed)
[name] tolerated Venofer infusion well without complaints or incident. Peripheral IV site checked with positive blood return noted prior to and after infusion. VSS upon discharge. Pt discharged via wheelchair in satisfactory condition accompanied by his wife

## 2019-07-23 NOTE — Progress Notes (Signed)
07/23/19  .Intravenous Iron Formulation Change  [name] has insurance that requires a change in intravenous iron product from Feraheme to Venofer. Orders have been updated to reflect this change and scheduling message sent to adjust infusion appointments. Dr Delton Coombes was notified and agrees with the plan.   The plan for iron therapy is as follows:  Venofer 200 mg IVPB x 5 doses at least 2 days apart.  Wynona Neat, PharmD 07/23/2019

## 2019-07-23 NOTE — Patient Instructions (Signed)
[facility] at [facility] Discharge Instructions  Received Venofer infusion today. Follow-up as scheduled. Call clinic for any questions or concerns   Thank you for choosing [facility] at [facility] to provide your oncology and hematology care.  To afford each patient quality time with our provider, please arrive at least 15 minutes before your scheduled appointment time.   If you have a lab appointment with the Cancer Center please come in thru the Main Entrance and check in at the main information desk.  You need to re-schedule your appointment should you arrive 10 or more minutes late.  We strive to give you quality time with our providers, and arriving late affects you and other patients whose appointments are after yours.  Also, if you no show three or more times for appointments you may be dismissed from the clinic at the providers discretion.     Again, thank you for choosing [facility].  Our hope is that these requests will decrease the amount of time that you wait before being seen by our physicians.       _____________________________________________________________  Should you have questions after your visit to  Cancer Center, please contact our office at [phone] between the hours of 8:00 a.m. and 4:30 p.m.  Voicemails left after 4:00 p.m. will not be returned until the following business day.  For prescription refill requests, have your pharmacy contact our office and allow 72 hours.    Due to Covid, you will need to wear a mask upon entering the hospital. If you do not have a mask, a mask will be given to you at the Main Entrance upon arrival. For doctor visits, patients may have 1 support person with them. For treatment visits, patients can not have anyone with them due to social distancing guidelines and our immunocompromised population.     

## 2019-07-24 ENCOUNTER — Other Ambulatory Visit (INDEPENDENT_AMBULATORY_CARE_PROVIDER_SITE_OTHER): Payer: Self-pay | Admitting: Nurse Practitioner

## 2019-07-24 DIAGNOSIS — N401 Enlarged prostate with lower urinary tract symptoms: Secondary | ICD-10-CM

## 2019-07-25 ENCOUNTER — Ambulatory Visit (HOSPITAL_COMMUNITY): Payer: Medicare HMO

## 2019-07-29 ENCOUNTER — Encounter (HOSPITAL_COMMUNITY): Payer: Self-pay

## 2019-07-29 ENCOUNTER — Other Ambulatory Visit: Payer: Self-pay

## 2019-07-29 ENCOUNTER — Inpatient Hospital Stay (HOSPITAL_COMMUNITY): Payer: Medicare HMO

## 2019-07-29 ENCOUNTER — Other Ambulatory Visit (INDEPENDENT_AMBULATORY_CARE_PROVIDER_SITE_OTHER): Payer: Self-pay

## 2019-07-29 VITALS — BP 102/50 | HR 72 | Temp 96.9°F | Resp 18

## 2019-07-29 DIAGNOSIS — D509 Iron deficiency anemia, unspecified: Secondary | ICD-10-CM | POA: Diagnosis not present

## 2019-07-29 DIAGNOSIS — D500 Iron deficiency anemia secondary to blood loss (chronic): Secondary | ICD-10-CM

## 2019-07-29 DIAGNOSIS — I214 Non-ST elevation (NSTEMI) myocardial infarction: Secondary | ICD-10-CM

## 2019-07-29 DIAGNOSIS — D649 Anemia, unspecified: Secondary | ICD-10-CM

## 2019-07-29 MED ORDER — SODIUM CHLORIDE 0.9 % IV SOLN: Freq: Once | INTRAVENOUS | Status: AC

## 2019-07-29 MED ORDER — SODIUM CHLORIDE 0.9 % IV SOLN
200.0000 mg | Freq: Once | INTRAVENOUS | Status: AC
  Administered 2019-07-29: 200 mg via INTRAVENOUS
  Filled 2019-07-29: qty 200

## 2019-07-29 MED ORDER — CLOPIDOGREL BISULFATE 75 MG PO TABS: 75.0000 mg | ORAL_TABLET | Freq: Every day | ORAL | 1 refills | Status: DC

## 2019-07-29 NOTE — Progress Notes (Signed)
Patient presents today for second Venofer. Vital signs stable. Patient has no complaints of any changes since his last visit. MAR reviewed.   Venofer given today per MD orders. Tolerated infusion without adverse affects. Vital signs stable. No complaints at this time. Discharged from clinic via wheel chair. F/U with [facility] as scheduled.

## 2019-07-29 NOTE — Patient Instructions (Signed)
[facility] at [facility]  Discharge Instructions:   _______________________________________________________________  Thank you for choosing [name] at [facility] to provide your oncology and hematology care.  To afford each patient quality time with our providers, please arrive at least 15 minutes before your scheduled appointment.  You need to re-schedule your appointment if you arrive 10 or more minutes late.  We strive to give you quality time with our providers, and arriving late affects you and other patients whose appointments are after yours.  Also, if you no show three or more times for appointments you may be dismissed from the clinic.  Again, thank you for choosing [facility]. Our hope is that these requests will allow you access to exceptional care and in a timely manner. _______________________________________________________________  If you have questions after your visit, please contact our office at [phone] between the hours of 8:30 a.m. and 5:00 p.m. Voicemails left after 4:30 p.m. will not be returned until the following business day. _______________________________________________________________  For prescription refill requests, have your pharmacy contact our office. _______________________________________________________________  Recommendations made by the consultant and any test results will be sent to your referring physician. _______________________________________________________________ 

## 2019-07-30 ENCOUNTER — Ambulatory Visit (HOSPITAL_COMMUNITY): Payer: Medicare HMO

## 2019-07-31 ENCOUNTER — Encounter (HOSPITAL_COMMUNITY): Payer: Self-pay

## 2019-07-31 ENCOUNTER — Other Ambulatory Visit: Payer: Self-pay

## 2019-07-31 ENCOUNTER — Inpatient Hospital Stay (HOSPITAL_COMMUNITY): Payer: Medicare HMO

## 2019-07-31 VITALS — BP 98/64 | HR 76 | Temp 97.8°F | Resp 20

## 2019-07-31 DIAGNOSIS — D509 Iron deficiency anemia, unspecified: Secondary | ICD-10-CM | POA: Diagnosis not present

## 2019-07-31 DIAGNOSIS — D649 Anemia, unspecified: Secondary | ICD-10-CM

## 2019-07-31 DIAGNOSIS — D500 Iron deficiency anemia secondary to blood loss (chronic): Secondary | ICD-10-CM

## 2019-07-31 MED ORDER — SODIUM CHLORIDE 0.9 % IV SOLN: Freq: Once | INTRAVENOUS | Status: AC

## 2019-07-31 MED ORDER — SODIUM CHLORIDE 0.9 % IV SOLN
200.0000 mg | Freq: Once | INTRAVENOUS | Status: AC
  Administered 2019-07-31: 200 mg via INTRAVENOUS
  Filled 2019-07-31: qty 10

## 2019-07-31 NOTE — Progress Notes (Signed)
[name] tolerated Venofer infusion well without complaints or incident. Peripheral IV site checked with positive blood return noted prior to and after infusion. VSS Pt discharged via wheelchair in satisfactory condition to ER face track where his wife is presently being seen

## 2019-07-31 NOTE — Patient Instructions (Signed)
[facility] at [facility] Discharge Instructions  Received Venofer infusion today. Follow-up as scheduled. Call clinic for any questions or concerns   Thank you for choosing [facility] at [facility] to provide your oncology and hematology care.  To afford each patient quality time with our provider, please arrive at least 15 minutes before your scheduled appointment time.   If you have a lab appointment with the Cancer Center please come in thru the Main Entrance and check in at the main information desk.  You need to re-schedule your appointment should you arrive 10 or more minutes late.  We strive to give you quality time with our providers, and arriving late affects you and other patients whose appointments are after yours.  Also, if you no show three or more times for appointments you may be dismissed from the clinic at the providers discretion.     Again, thank you for choosing [facility].  Our hope is that these requests will decrease the amount of time that you wait before being seen by our physicians.       _____________________________________________________________  Should you have questions after your visit to [facility], please contact our office at [phone] between the hours of 8:00 a.m. and 4:30 p.m.  Voicemails left after 4:00 p.m. will not be returned until the following business day.  For prescription refill requests, have your pharmacy contact our office and allow 72 hours.    Due to Covid, you will need to wear a mask upon entering the hospital. If you do not have a mask, a mask will be given to you at the Main Entrance upon arrival. For doctor visits, patients may have 1 support person with them. For treatment visits, patients can not have anyone with them due to social distancing guidelines and our immunocompromised population.     

## 2019-08-02 ENCOUNTER — Other Ambulatory Visit: Payer: Self-pay

## 2019-08-02 ENCOUNTER — Inpatient Hospital Stay (HOSPITAL_COMMUNITY): Payer: Medicare HMO

## 2019-08-02 ENCOUNTER — Encounter (HOSPITAL_COMMUNITY): Payer: Self-pay

## 2019-08-02 VITALS — BP 118/48 | HR 63 | Temp 97.5°F | Resp 18

## 2019-08-02 DIAGNOSIS — D500 Iron deficiency anemia secondary to blood loss (chronic): Secondary | ICD-10-CM

## 2019-08-02 DIAGNOSIS — D509 Iron deficiency anemia, unspecified: Secondary | ICD-10-CM | POA: Diagnosis not present

## 2019-08-02 DIAGNOSIS — D649 Anemia, unspecified: Secondary | ICD-10-CM

## 2019-08-02 MED ORDER — SODIUM CHLORIDE 0.9 % IV SOLN: Freq: Once | INTRAVENOUS | Status: AC

## 2019-08-02 MED ORDER — SODIUM CHLORIDE 0.9 % IV SOLN
200.0000 mg | Freq: Once | INTRAVENOUS | Status: AC
  Administered 2019-08-02: 200 mg via INTRAVENOUS
  Filled 2019-08-02: qty 200

## 2019-08-02 NOTE — Patient Instructions (Signed)
[facility] at [facility] Discharge Instructions  Received Venofer infusion today. Follow-up as scheduled. Call clinic for any questions or concerns   Thank you for choosing [facility] at [facility] to provide your oncology and hematology care.  To afford each patient quality time with our provider, please arrive at least 15 minutes before your scheduled appointment time.   If you have a lab appointment with the Cancer Center please come in thru the Main Entrance and check in at the main information desk.  You need to re-schedule your appointment should you arrive 10 or more minutes late.  We strive to give you quality time with our providers, and arriving late affects you and other patients whose appointments are after yours.  Also, if you no show three or more times for appointments you may be dismissed from the clinic at the providers discretion.     Again, thank you for choosing [facility].  Our hope is that these requests will decrease the amount of time that you wait before being seen by our physicians.       _____________________________________________________________  Should you have questions after your visit to [facility], please contact our office at [phone] between the hours of 8:00 a.m. and 4:30 p.m.  Voicemails left after 4:00 p.m. will not be returned until the following business day.  For prescription refill requests, have your pharmacy contact our office and allow 72 hours.    Due to Covid, you will need to wear a mask upon entering the hospital. If you do not have a mask, a mask will be given to you at the Main Entrance upon arrival. For doctor visits, patients may have 1 support person with them. For treatment visits, patients can not have anyone with them due to social distancing guidelines and our immunocompromised population.     

## 2019-08-02 NOTE — Progress Notes (Signed)
[name] tolerated Venofer infusion well without complaints or incident. Peripheral IV site checked with positive blood return prior to and after infusion. VSS upon discharge. Pt discharged via wheelchair in satisfactory condition

## 2019-08-05 ENCOUNTER — Inpatient Hospital Stay (HOSPITAL_COMMUNITY): Payer: Medicare HMO | Attending: Hematology

## 2019-08-05 ENCOUNTER — Other Ambulatory Visit: Payer: Self-pay

## 2019-08-05 ENCOUNTER — Telehealth (INDEPENDENT_AMBULATORY_CARE_PROVIDER_SITE_OTHER): Payer: Self-pay

## 2019-08-05 ENCOUNTER — Encounter (HOSPITAL_COMMUNITY): Payer: Self-pay

## 2019-08-05 VITALS — BP 104/52 | HR 68 | Temp 97.8°F | Resp 18

## 2019-08-05 DIAGNOSIS — D649 Anemia, unspecified: Secondary | ICD-10-CM

## 2019-08-05 DIAGNOSIS — D509 Iron deficiency anemia, unspecified: Secondary | ICD-10-CM | POA: Insufficient documentation

## 2019-08-05 DIAGNOSIS — D500 Iron deficiency anemia secondary to blood loss (chronic): Secondary | ICD-10-CM

## 2019-08-05 MED ORDER — SODIUM CHLORIDE 0.9 % IV SOLN
200.0000 mg | Freq: Once | INTRAVENOUS | Status: AC
  Administered 2019-08-05: 200 mg via INTRAVENOUS
  Filled 2019-08-05: qty 200

## 2019-08-05 MED ORDER — SODIUM CHLORIDE 0.9 % IV SOLN: Freq: Once | INTRAVENOUS | Status: AC

## 2019-08-05 NOTE — Progress Notes (Signed)
[name] tolerated Venofer infusion well without complaints or incident. Peripheral IV site checked with positive blood return noted prior to and after infusion. VSS upon discharge. Pt discharged self ambulatory using his walker in satisfactory condition accompanied by his wife

## 2019-08-05 NOTE — Telephone Encounter (Signed)
Sent a message for [employer] [facility] pt was brought only one tank. Order will have to be resend and increased number of tanks. I sendt a message to [employer] rep to help liasion this issue for patients in home  To know wheat they need. Waiting for a return message/ call.

## 2019-08-05 NOTE — Telephone Encounter (Signed)
Can you put the order in and I will sign it?  I think you are better at this and I am!

## 2019-08-05 NOTE — Patient Instructions (Signed)
[facility] at [facility] Discharge Instructions  Received Venofer infusion today. Follow-up as scheduled. Call clinic for any questions or concerns   Thank you for choosing [facility] at [facility] to provide your oncology and hematology care.  To afford each patient quality time with our provider, please arrive at least 15 minutes before your scheduled appointment time.   If you have a lab appointment with the Cancer Center please come in thru the Main Entrance and check in at the main information desk.  You need to re-schedule your appointment should you arrive 10 or more minutes late.  We strive to give you quality time with our providers, and arriving late affects you and other patients whose appointments are after yours.  Also, if you no show three or more times for appointments you may be dismissed from the clinic at the providers discretion.     Again, thank you for choosing [facility].  Our hope is that these requests will decrease the amount of time that you wait before being seen by our physicians.       _____________________________________________________________  Should you have questions after your visit to [facility], please contact our office at [phone] between the hours of 8:00 a.m. and 4:30 p.m.  Voicemails left after 4:00 p.m. will not be returned until the following business day.  For prescription refill requests, have your pharmacy contact our office and allow 72 hours.    Due to Covid, you will need to wear a mask upon entering the hospital. If you do not have a mask, a mask will be given to you at the Main Entrance upon arrival. For doctor visits, patients may have 1 support person with them. For treatment visits, patients can not have anyone with them due to social distancing guidelines and our immunocompromised population.     

## 2019-08-06 ENCOUNTER — Ambulatory Visit (INDEPENDENT_AMBULATORY_CARE_PROVIDER_SITE_OTHER): Payer: Medicare HMO | Admitting: Nurse Practitioner

## 2019-08-06 DIAGNOSIS — F0390 Unspecified dementia without behavioral disturbance: Secondary | ICD-10-CM

## 2019-08-06 DIAGNOSIS — F4310 Post-traumatic stress disorder, unspecified: Secondary | ICD-10-CM

## 2019-08-06 DIAGNOSIS — I2510 Atherosclerotic heart disease of native coronary artery without angina pectoris: Secondary | ICD-10-CM

## 2019-08-06 DIAGNOSIS — I10 Essential (primary) hypertension: Secondary | ICD-10-CM

## 2019-08-06 DIAGNOSIS — I255 Ischemic cardiomyopathy: Secondary | ICD-10-CM

## 2019-08-06 DIAGNOSIS — E7800 Pure hypercholesterolemia, unspecified: Secondary | ICD-10-CM

## 2019-08-06 DIAGNOSIS — D630 Anemia in neoplastic disease: Secondary | ICD-10-CM

## 2019-08-06 DIAGNOSIS — N400 Enlarged prostate without lower urinary tract symptoms: Secondary | ICD-10-CM

## 2019-08-06 DIAGNOSIS — F329 Major depressive disorder, single episode, unspecified: Secondary | ICD-10-CM

## 2019-08-06 DIAGNOSIS — I252 Old myocardial infarction: Secondary | ICD-10-CM

## 2019-08-13 ENCOUNTER — Other Ambulatory Visit (INDEPENDENT_AMBULATORY_CARE_PROVIDER_SITE_OTHER): Payer: Self-pay | Admitting: Internal Medicine

## 2019-08-13 ENCOUNTER — Telehealth (INDEPENDENT_AMBULATORY_CARE_PROVIDER_SITE_OTHER): Payer: Self-pay

## 2019-08-13 MED ORDER — FUROSEMIDE 20 MG PO TABS: 20.0000 mg | ORAL_TABLET | Freq: Every day | ORAL | 3 refills | Status: DC

## 2019-08-13 NOTE — Telephone Encounter (Signed)
Please let patient know I have sent this prescription to [facility].

## 2019-08-14 ENCOUNTER — Telehealth (INDEPENDENT_AMBULATORY_CARE_PROVIDER_SITE_OTHER): Payer: Self-pay

## 2019-08-14 NOTE — Telephone Encounter (Signed)
This patient will need to be evaluated if he has swelling of one leg more than the other.  Either he can go to an urgent care or the emergency room.  We need to make sure he does not have a DVT.

## 2019-08-14 NOTE — Telephone Encounter (Signed)
Pt has appt to see [place] on Monday. But when he is in recliner resting the leg is going down with the fluids pill wife stated. He has PT again on Friday the therapist will check and call office to notify us the changes.

## 2019-08-14 NOTE — Telephone Encounter (Signed)
Pt was seen by PT on tuesday. Pt has pitting edema Lt leg +3 & Rt leg is +1 pitting. If there is anything you would like them to do at this time to call back.

## 2019-08-19 ENCOUNTER — Ambulatory Visit (INDEPENDENT_AMBULATORY_CARE_PROVIDER_SITE_OTHER): Payer: Medicare HMO | Admitting: Nurse Practitioner

## 2019-08-19 ENCOUNTER — Other Ambulatory Visit: Payer: Self-pay

## 2019-08-19 ENCOUNTER — Encounter (INDEPENDENT_AMBULATORY_CARE_PROVIDER_SITE_OTHER): Payer: Self-pay | Admitting: Nurse Practitioner

## 2019-08-19 VITALS — BP 90/70 | HR 74 | Temp 98.0°F | Ht 68.0 in | Wt 140.8 lb

## 2019-08-19 DIAGNOSIS — N401 Enlarged prostate with lower urinary tract symptoms: Secondary | ICD-10-CM

## 2019-08-19 DIAGNOSIS — R21 Rash and other nonspecific skin eruption: Secondary | ICD-10-CM

## 2019-08-19 DIAGNOSIS — R413 Other amnesia: Secondary | ICD-10-CM

## 2019-08-19 DIAGNOSIS — J9601 Acute respiratory failure with hypoxia: Secondary | ICD-10-CM

## 2019-08-19 DIAGNOSIS — R531 Weakness: Secondary | ICD-10-CM

## 2019-08-19 MED ORDER — VITAMIN B-12 1000 MCG PO TABS: 1000.0000 ug | ORAL_TABLET | Freq: Every day | ORAL | 0 refills | Status: DC

## 2019-08-19 NOTE — Progress Notes (Signed)
Subjective:  Patient ID: [name], male    DOB: 1946-05-19  Age: 74 y.o. MRN: [id number]  CC:  Chief Complaint  Patient presents with  . Memory Loss  . Follow-up    BPH, weakness, rash      HPI  This patient comes in today for the above.  Memory loss: He continues to have some issues with his memory as reported by his wife.  We did collect blood work at his last office visit and his vitamin B12 was in the borderline low/normal range.  BPH: I also refilled his Flomax at the last office visit.  Today he tells me that his urinary symptoms seem to have improved.  Weakness: Home health physical therapy has been working with this patient at home.  According to he and his wife his endurance and strength are returning.  He was able to walk into the office today and did not require a wheelchair.  Rash: This has resolved with use of nystatin powder.  Of note, the wife is asking if patient will need to continue on oxygen continuously.   Past Medical History:  Diagnosis Date  . Acute MI, inferolateral wall, initial episode of care ([place]) 06/16/15   99% dCx   . BPH (benign prostatic hyperplasia) 04/08/2011  . CAD S/P percutaneous coronary angioplasty 11/23/12; 06/16/15   a. 2 overlapping mRCA lesions - Xience Xpedition DES 2.75 mm x 18 mm (3.0 mm);; b. Inf-Lat STEMI 06/16/2015 - dCx Asp Thrombectomy & PCI 3.0 x 15 Xience drDES (3.6 mm)  . Chronic headache   . CLL (chronic lymphocytic leukemia) ([place]) 04/08/2011  . Depression   . Diverticulitis   . Gallstone   . GERD (gastroesophageal reflux disease)   . Hypercholesteremia   . Ischemic cardiomyopathy    Echo 06/18/2015 EF 35-40% after lateral MI -> repeat echo 08/06/2015: EF 40-45% with basal-mid inferolateral akinesis and hypokinesis of the lateral wall.  . NSTEMI (non-ST elevated myocardial infarction) ([place]) 11/23/12   NSTEMI 11/23/2012 RCA lesion - PCI with DES; moderate LAD disease  . PTSD (post-traumatic stress disorder)    With Depression - since MI      Family History  Problem Relation Age of Onset  . Diabetes Mother   . Cancer Father   . Colon cancer Neg Hx     Social History   Social History Narrative   Exercises 3 to 4 days a week at the fitness center for about a half an hour at a time.   "former smoker". Does not drink EtOH   Married.   Social History   Tobacco Use  . Smoking status: Former Smoker    Packs/day: 0.25    Years: 2.00    Pack years: 0.50    Types: Cigarettes    Quit date: 06/07/1963    Years since quitting: 56.2  . Smokeless tobacco: Never Used  Substance Use Topics  . Alcohol use: No    Alcohol/week: 0.0 standard drinks    Comment: Occasionally drinks red wine     Current Meds  Medication Sig  . clopidogrel (PLAVIX) 75 MG tablet Take 1 tablet (75 mg total) by mouth daily.  . furosemide (LASIX) 20 MG tablet Take 1 tablet (20 mg total) by mouth daily.  Marland Kitchen lisinopril (ZESTRIL) 5 MG tablet TAKE 1 TABLET BY MOUTH EVERY DAY  . pantoprazole (PROTONIX) 40 MG tablet TAKE 1 TABLET BY MOUTH 30 MINUTES BEFORE A MEAL ONCE OR TWICE DAILY  . simvastatin (ZOCOR)  40 MG tablet Take 1 tablet (40 mg total) by mouth daily at 6 PM.  . tamsulosin (FLOMAX) 0.4 MG CAPS capsule TAKE 1 CAPSULE (0.4 MG TOTAL) BY MOUTH DAILY AFTER BREAKFAST.    ROS:  Review of Systems  Constitutional: Negative for fever, malaise/fatigue and weight loss.  Eyes: Negative for blurred vision and double vision.  Respiratory: Positive for shortness of breath. Negative for cough and wheezing.   Cardiovascular: Negative for chest pain and palpitations.  Genitourinary: Negative for dysuria and hematuria.  Neurological: Negative for dizziness and headaches.     Objective:   Today's Vitals: BP 90/70 (BP Location: Left Arm, Patient Position: Sitting, Cuff Size: Normal)   Pulse 74   Temp 98 F (36.7 C) (Temporal)   Ht 5\' 8"  (1.727 m)   Wt 140 lb 12.8 oz (63.9 kg)   SpO2 98%   BMI 21.41 kg/m  Vitals with BMI  08/19/2019 08/05/2019 08/05/2019  Height 5\' 8"  - -  Weight 140 lbs 13 oz - -  BMI [id number] - -  Systolic 90 [id number]  Diastolic 70 52 56  Pulse 74 68 70     Physical Exam Vitals reviewed.  Constitutional:      Appearance: Normal appearance.  HENT:     Head: Normocephalic and atraumatic.  Cardiovascular:     Rate and Rhythm: Normal rate and regular rhythm.  Pulmonary:     Effort: Pulmonary effort is normal.     Breath sounds: Normal breath sounds.  Musculoskeletal:     Cervical back: Neck supple.  Skin:    General: Skin is warm and dry.  Neurological:     Mental Status: He is alert and oriented to person, place, and time.  Psychiatric:        Mood and Affect: Mood normal.        Behavior: Behavior normal.        Thought Content: Thought content normal.        Judgment: Judgment normal.     Patient was taken off of oxygen in the room.  He was also walked in the room on room air.  Oxygen saturations never dropped below 96%.  He did experience some shortness of breath when oxygen was turned off.     Assessment and Plan   1. Memory loss   2. Rash   3. Benign prostatic hyperplasia with lower urinary tract symptoms, symptom details unspecified   4. Acute respiratory failure with hypoxia (HCC)   5. Weakness      Plan: 1.  He will trial a daily vitamin B12 to see if this improves his memory.  We will need to check serum level again when he follows up in approximately 2 months.  If memory continues to be an issue he will be referred to neurology for further work-up.  Patient is also anxious to start driving again, at this point I think that he should refrain from driving until evaluated further.  May consider referral to occupational therapy for further evaluation for ability to drive.  2.  This has resolved.  No further work-up or treatment regimen recommendations made today.  3.  Flomax seems to have improved his symptoms.  He will continue on this medication as prescribed.  4.   For now I recommended that the patient could remain on room air if at rest at home.  If the patient starts to feel short of breath I did recommend that he restart his oxygen.  While being active  in the home I recommended he continue on his oxygen and he continue on his oxygen overnight.  At next visit plan on walking him in the hallway for further evaluation of supplemental oxygen needs.  5.  He will continue to follow-up and work with home health physical therapy.   Tests ordered No orders of the defined types were placed in this encounter.     Meds ordered this encounter  Medications  . vitamin B-12 (CYANOCOBALAMIN) 1000 MCG tablet    Sig: Take 1 tablet (1,000 mcg total) by mouth daily.    Dispense:  90 tablet    Refill:  0    Order Specific Question:   Supervising Provider    Answer:   [name] [id number]    Patient to follow-up in 8 weeks.  Ailene Ards, NP

## 2019-08-20 ENCOUNTER — Other Ambulatory Visit (INDEPENDENT_AMBULATORY_CARE_PROVIDER_SITE_OTHER): Payer: Self-pay | Admitting: Nurse Practitioner

## 2019-08-20 DIAGNOSIS — N401 Enlarged prostate with lower urinary tract symptoms: Secondary | ICD-10-CM

## 2019-08-22 ENCOUNTER — Other Ambulatory Visit: Payer: Self-pay | Admitting: Interventional Radiology

## 2019-08-22 DIAGNOSIS — Z95828 Presence of other vascular implants and grafts: Secondary | ICD-10-CM

## 2019-09-11 ENCOUNTER — Encounter: Payer: Self-pay | Admitting: Radiology

## 2019-09-11 ENCOUNTER — Ambulatory Visit
Admission: RE | Admit: 2019-09-11 | Discharge: 2019-09-11 | Disposition: A | Discharge status: Home / Self Care | Payer: Medicare HMO | Source: Ambulatory Visit | Attending: Interventional Radiology | Admitting: Interventional Radiology

## 2019-09-11 DIAGNOSIS — Z95828 Presence of other vascular implants and grafts: Secondary | ICD-10-CM

## 2019-09-11 HISTORY — PX: IR RADIOLOGIST EVAL & MGMT: IMG5224

## 2019-09-11 NOTE — Progress Notes (Signed)
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 Verlon Au  History of Present Illness: [name] is a 74 y.o. male who was admitted to the hospital with COVID+ pneumonia as well as other acute issues including hematochezia.  D-dimer was noted to be elevated and a bilateral lower extremity duplex ultrasound demonstrated age indeterminant DVT bilaterally.  Given the patient's ongoing GI bleed, he was not a candidate for anticoagulation and therefore, interventional radiology was consulted for IVC filter placement.  I placed an IVC filter for this gentleman on 05/27/2019.  He presents today for follow-up evaluation and assessment of the continued need for caval interruption.  He has been successfully discharged home from the hospital.  He has lingering effects from his Covid pneumonia and now requires continuous home oxygen.  He is able to ambulate but is fairly sedentary.  Additionally, he has intermittent lower extremity swelling worse on the left than the right.  He is currently on Lasix.  He is not currently on any anticoagulation although he does take Plavix for his coronary artery disease and coronary artery stents.  He denies current chest pain or shortness of breath and reports that he is at his baseline.  Past Medical History:  Diagnosis Date  . Acute MI, inferolateral wall, initial episode of care ([place]) 06/16/15   99% dCx   . BPH (benign prostatic hyperplasia) 04/08/2011  . CAD S/P percutaneous coronary angioplasty 11/23/12; 06/16/15   a. 2 overlapping mRCA lesions - Xience Xpedition DES 2.75 mm x 18 mm (3.0 mm);; b. Inf-Lat STEMI 06/16/2015 - dCx Asp Thrombectomy & PCI 3.0 x 15 Xience drDES (3.6 mm)  . Chronic headache   . CLL (chronic lymphocytic leukemia) ([place]) 04/08/2011  . Depression   . Diverticulitis   . Gallstone   . GERD (gastroesophageal reflux disease)     . Hypercholesteremia   . Ischemic cardiomyopathy    Echo 06/18/2015 EF 35-40% after lateral MI -> repeat echo 08/06/2015: EF 40-45% with basal-mid inferolateral akinesis and hypokinesis of the lateral wall.  . NSTEMI (non-ST elevated myocardial infarction) ([place]) 11/23/12   NSTEMI 11/23/2012 RCA lesion - PCI with DES; moderate LAD disease  . PTSD (post-traumatic stress disorder)    With Depression - since MI    Past Surgical History:  Procedure Laterality Date  . APPENDECTOMY  1968-70  . CARDIAC CATHETERIZATION N/A 06/16/2015   Procedure: Left Heart Cath and Coronary Angiography;  Surgeon: Jettie Booze, MD;  Location: [facility];  Service: Cardiovascular: 99% thrombotic dCx -> asp thrombectomy & PCI. RCA Stents Patent.   Marland Kitchen CARDIAC CATHETERIZATION N/A 06/16/2015   Procedure: Coronary Stent Intervention;  Surgeon: Jettie Booze, MD;  Location: [facility];  Service: Cardiovascular: PCI dCx = thrombectomy -> 3.0 x 15 Xience drug-eluting stent (3.6 mm)   . COLONOSCOPY  2008   [id number] diverticula, diminutive polyp in the cecum, s/p bx Normal rectum. Tubular adenoma  . COLONOSCOPY N/A 10/17/2012   Dr. Gala Romney: colonic diverticulosis, tubular adenoma, surveillance due May 2019  . COLONOSCOPY N/A 02/21/2018   Procedure: COLONOSCOPY;  Surgeon: Daneil Dolin, MD;  [facility];  Service: Endoscopy;  Laterality: N/A;  12:00  . CORONARY STENT PLACEMENT  11/23/12   Mid RCA -- Xience eXp - 2.75 mm x 18 mm DES (3.28mm) , Dr. Ellyn Hack  . IR IVC FILTER PLMT / S&I /  IMG GUID/MOD SED  05/27/2019  . LEFT HEART CATHETERIZATION WITH CORONARY ANGIOGRAM N/A 11/23/2012   Procedure: LEFT HEART CATHETERIZATION WITH CORONARY ANGIOGRAM;  Surgeon: Leonie Man, MD;  Location: [facility] LAB;  Service: Cardiovascular: NSTEMI: Culprit = mid RCA 95% & 75%; LAD ~40-50%, Small RI ~60%; EF ~60%, mild inf-basal HK     . TRANSTHORACIC ECHOCARDIOGRAM  January 2017; March 2017   a. EF 35-40%.  Entire inferior hypokinesis and akinesis of the basal and mid inferolateral and anterolateral /distal lateral walls;; b. EF 40 and 45%. Akinesis of basal-mid inferolateral wall. Hypokinesis of entire lateral wall.    Allergies: Patient has no known allergies.  Medications: Prior to Admission medications   Medication Sig Start Date End Date Taking? Authorizing Provider  ascorbic acid (VITAMIN C) 500 MG tablet Take 1 tablet (500 mg total) by mouth daily. Patient not taking: Reported on 08/19/2019 05/29/19   Nita Sells, MD  cholecalciferol (VITAMIN D) 1000 units tablet Take 1,000 Units by mouth once a week.    [provider]  clopidogrel (PLAVIX) 75 MG tablet Take 1 tablet (75 mg total) by mouth daily. 07/29/19   [name], MD  furosemide (LASIX) 20 MG tablet Take 1 tablet (20 mg total) by mouth daily. 08/13/19   Gosrani, Nimish C, MD  lisinopril (ZESTRIL) 5 MG tablet TAKE 1 TABLET BY MOUTH EVERY DAY 07/08/19   Hurshel Party C, MD  nystatin (MYCOSTATIN/NYSTOP) powder Apply 1 application topically 3 (three) times daily. Patient not taking: Reported on 08/19/2019 07/09/19   Ailene Ards, NP  pantoprazole (PROTONIX) 40 MG tablet TAKE 1 TABLET BY MOUTH 30 MINUTES BEFORE A MEAL ONCE OR TWICE DAILY 06/24/19   Mahala Menghini, PA-C  simvastatin (ZOCOR) 40 MG tablet Take 1 tablet (40 mg total) by mouth daily at 6 PM. 06/24/19   Gosrani, Nimish C, MD  sodium bicarbonate 325 MG tablet Take 1 tablet (325 mg total) by mouth 2 (two) times daily. Patient not taking: Reported on 08/19/2019 05/28/19   Nita Sells, MD  tamsulosin (FLOMAX) 0.4 MG CAPS capsule TAKE 1 CAPSULE (0.4 MG TOTAL) BY MOUTH DAILY AFTER BREAKFAST. 08/20/19   [name], MD  vitamin B-12 (CYANOCOBALAMIN) 1000 MCG tablet Take 1 tablet (1,000 mcg total) by mouth daily. 08/19/19   Ailene Ards, NP  zinc sulfate 220 (50 Zn) MG capsule Take 1 capsule (220 mg total) by mouth daily. Patient not taking: Reported on  08/19/2019 05/29/19   Nita Sells, MD  lisinopril (PRINIVIL,ZESTRIL) 2.5 MG tablet Take 1 tablet (2.5 mg total) by mouth daily. 06/18/15   Almyra Deforest, PA  nitroGLYCERIN (NITROSTAT) 0.4 MG SL tablet Place 1 tablet (0.4 mg total) under the tongue every 5 (five) minutes as needed for chest pain. Patient not taking: Reported on 05/14/2018 09/30/15 11/29/18  Leonie Man, MD  simvastatin (ZOCOR) 40 MG tablet Take 1 tablet (40 mg total) by mouth every other day. NEED OV. 08/15/18   Leonie Man, MD     Family History  Problem Relation Age of Onset  . Diabetes Mother   . Cancer Father   . Colon cancer Neg Hx     Social History   Socioeconomic History  . Marital status: Married    Spouse name: Not on file  . Number of children: Not on file  . Years of education: Not on file  . Highest education level: Not on file  Occupational History  . Not on file  Tobacco  Use  . Smoking status: Former Smoker    Packs/day: 0.25    Years: 2.00    Pack years: 0.50    Types: Cigarettes    Quit date: 06/07/1963    Years since quitting: 56.3  . Smokeless tobacco: Never Used  Substance and Sexual Activity  . Alcohol use: No    Alcohol/week: 0.0 standard drinks    Comment: Occasionally drinks red wine  . Drug use: No  . Sexual activity: Not on file  Other Topics Concern  . Not on file  Social History Narrative   Exercises 3 to 4 days a week at the fitness center for about a half an hour at a time.   "former smoker". Does not drink EtOH   Married.   Social Determinants of Health   Financial Resource Strain:   . Difficulty of Paying Living Expenses:   Food Insecurity:   . Worried About Charity fundraiser in the Last Year:   . Arboriculturist in the Last Year:   Transportation Needs:   . Film/video editor (Medical):   Marland Kitchen Lack of Transportation (Non-Medical):   Physical Activity:   . Days of Exercise per Week:   . Minutes of Exercise per Session:   Stress:   . Feeling of Stress  :   Social Connections:   . Frequency of Communication with Friends and Family:   . Frequency of Social Gatherings with Friends and Family:   . Attends Religious Services:   . Active Member of Clubs or Organizations:   . Attends Archivist Meetings:   Marland Kitchen Marital Status:     Review of Systems: A 12 point ROS discussed and pertinent positives are indicated in the HPI above.  All other systems are negative.  Review of Systems  Vital Signs: There were no vitals taken for this visit.  Physical Exam Constitutional:      Appearance: Normal appearance.  HENT:     Head: Normocephalic and atraumatic.  Eyes:     General: No scleral icterus. Cardiovascular:     Rate and Rhythm: Normal rate.  Pulmonary:     Effort: Pulmonary effort is normal.  Abdominal:     General: Abdomen is flat.  Musculoskeletal:        General: Swelling present.  Skin:    General: Skin is warm and dry.  Neurological:     Mental Status: He is alert and oriented to person, place, and time.  Psychiatric:        Mood and Affect: Mood normal.        Behavior: Behavior normal.      Imaging: No results found.  Labs:  CBC: Recent Labs    05/27/19 0310 05/28/19 0500 07/09/19 1556 07/16/19 1116  WBC 41.7* 41.0* 12.2* 11.2*  HGB 9.4* 9.2* 10.9* 10.3*  HCT 28.6* 29.4* 34.5* 34.1*  PLT 152 140* 263 318    COAGS: Recent Labs    05/15/19 1359 05/23/19 0156  INR 1.2 1.4*  APTT 36  --     BMP: Recent Labs    05/27/19 0310 05/28/19 0500 07/09/19 1556 07/16/19 1116  NA 138 139 141 139  K 4.4 4.4 4.5 3.8  CL 115* 112* 107 106  CO2 16* 19* 25 25  GLUCOSE 172* 164* 113* 145*  BUN 32* 28* 16 21  CALCIUM 7.3* 7.4* 8.7 8.6*  CREATININE 1.10 1.29* 1.04 1.21  GFRNONAA >60 54* 70 58*  GFRAA >60 >60 81 >60  LIVER FUNCTION TESTS: Recent Labs    05/26/19 0356 05/26/19 0356 05/27/19 0310 05/28/19 0500 07/09/19 1556 07/16/19 1116  BILITOT 0.8   < > 1.0 0.7 0.4 0.5  AST 21   < > 23  22 17 17   ALT 13   < > 12 12 6* 10  ALKPHOS 40  --  45 45  --  70  PROT 3.8*   < > 4.0* 3.7* 5.5* 5.9*  ALBUMIN 1.8*  --  1.8* 1.9*  --  3.0*   < > = values in this interval not displayed.    TUMOR MARKERS: No results for input(s): AFPTM, CEA, CA199, CHROMGRNA in the last 8760 hours.  Assessment and Plan:  Mr. Stavis duplex ultrasound evaluation today demonstrates persistent age-indeterminate thrombus in the left lower extremity.  Given his relatively sedentary lifestyle, underlying comorbidities and recent history of GI bleeding, he remains a very poor candidate for anticoagulation.  Therefore, he still requires caval interruption in the presence of his IVC filter remains indicated and warranted.  At this time, due to his age and underlying medical comorbidities, this IVC filter can now be considered a permanent device.  We will no longer actively follow Mr. Ingalsbe to assess for filter retrieval.  If his clinical status unexpectedly improves in the future, or if he resumes anticoagulation without complication, we would be happy to see him back to reassess.    Electronically Signed: Jacqulynn Cadet 09/11/2019, 10:43 AM   I spent a total of  25 Minutes in face to face in clinical consultation, greater than 50% of which was counseling/coordinating care for history of DVT and PE with IVC filter in place.

## 2019-10-14 ENCOUNTER — Encounter (INDEPENDENT_AMBULATORY_CARE_PROVIDER_SITE_OTHER): Payer: Self-pay | Admitting: Nurse Practitioner

## 2019-10-14 ENCOUNTER — Other Ambulatory Visit: Payer: Self-pay

## 2019-10-14 ENCOUNTER — Ambulatory Visit (INDEPENDENT_AMBULATORY_CARE_PROVIDER_SITE_OTHER): Payer: Medicare HMO | Admitting: Nurse Practitioner

## 2019-10-14 ENCOUNTER — Other Ambulatory Visit (INDEPENDENT_AMBULATORY_CARE_PROVIDER_SITE_OTHER): Payer: Self-pay | Admitting: Internal Medicine

## 2019-10-14 VITALS — BP 100/60 | HR 56 | Temp 98.2°F | Ht 68.0 in | Wt 144.4 lb

## 2019-10-14 DIAGNOSIS — C9110 Chronic lymphocytic leukemia of B-cell type not having achieved remission: Secondary | ICD-10-CM

## 2019-10-14 DIAGNOSIS — I10 Essential (primary) hypertension: Secondary | ICD-10-CM | POA: Diagnosis not present

## 2019-10-14 DIAGNOSIS — E785 Hyperlipidemia, unspecified: Secondary | ICD-10-CM

## 2019-10-14 DIAGNOSIS — R413 Other amnesia: Secondary | ICD-10-CM

## 2019-10-14 DIAGNOSIS — R531 Weakness: Secondary | ICD-10-CM

## 2019-10-14 DIAGNOSIS — N401 Enlarged prostate with lower urinary tract symptoms: Secondary | ICD-10-CM

## 2019-10-14 DIAGNOSIS — E559 Vitamin D deficiency, unspecified: Secondary | ICD-10-CM | POA: Diagnosis not present

## 2019-10-14 DIAGNOSIS — Z8616 Personal history of COVID-19: Secondary | ICD-10-CM

## 2019-10-14 LAB — COMPLETE METABOLIC PANEL WITH GFR
AG Ratio: 1.9 (calc) (ref 1.0–2.5)
ALT: 9 U/L (ref 9–46)
AST: 17 U/L (ref 10–35)
Albumin: 4.1 g/dL (ref 3.6–5.1)
Alkaline phosphatase (APISO): 41 U/L (ref 35–144)
BUN/Creatinine Ratio: 11 (calc) (ref 6–22)
BUN: 16 mg/dL (ref 7–25)
CO2: 30 mmol/L (ref 20–32)
Calcium: 9.9 mg/dL (ref 8.6–10.3)
Chloride: 107 mmol/L (ref 98–110)
Creat: 1.47 mg/dL — ABNORMAL HIGH (ref 0.70–1.18)
GFR, Est African American: 53 mL/min/{1.73_m2} — ABNORMAL LOW (ref >=60)
GFR, Est Non African American: 46 mL/min/{1.73_m2} — ABNORMAL LOW (ref >=60)
Globulin: 2.2 g/dL (calc) (ref 1.9–3.7)
Glucose, Bld: 92 mg/dL (ref 65–99)
Potassium: 4.6 mmol/L (ref 3.5–5.3)
Sodium: 144 mmol/L (ref 135–146)
Total Bilirubin: 0.7 mg/dL (ref 0.2–1.2)
Total Protein: 6.3 g/dL (ref 6.1–8.1)

## 2019-10-14 LAB — LIPID PANEL
Cholesterol: 139 mg/dL (ref <200)
HDL: 38 mg/dL — ABNORMAL LOW (ref >=40)
LDL Cholesterol (Calc): 77 mg/dL (calc)
Non-HDL Cholesterol (Calc): 101 mg/dL (calc) (ref <130)
Total CHOL/HDL Ratio: 3.7 (calc) (ref <5.0)
Triglycerides: 143 mg/dL (ref <150)

## 2019-10-14 LAB — VITAMIN D 25 HYDROXY (VIT D DEFICIENCY, FRACTURES): Vit D, 25-Hydroxy: 33 ng/mL (ref 30–100)

## 2019-10-14 LAB — VITAMIN B12: Vitamin B-12: 514 pg/mL (ref 200–1100)

## 2019-10-14 NOTE — Progress Notes (Signed)
Subjective:  Patient ID: [name], male    DOB: Apr 16, 1946  Age: 74 y.o. MRN: [id number]  CC:  Chief Complaint  Patient presents with  . Memory Loss  . Hyperlipidemia  . Medication Refill  . Follow-up      HPI  This patient comes in today accompanied by his wife for follow-up.  Memory loss: He was hospitalized earlier this year with pneumonia caused by the COVID-19 virus.  He has had a long recovery.,  And did experience some forgetfulness and memory loss that was noticed by this patient's wife.  I did check his vitamin B12 level back in February 2021 and it was below 300 at that time.  We did start him on a vitamin B12 supplement, and he is due to have levels repeated today.  He and his wife feel that his forgetfulness has improved.  Weakness: He was also experiencing significant weakness after being hospitalized for significant amount of time for pneumonia.  He was working with home health physical therapy but they have now discontinued their services with him because he has improved greatly.  He is no longer requiring a wheelchair, and is able to ambulate on his own.  Respiratory failure: He did experience respiratory failure due to pneumonia caused by COVID-19 virus.  He was on 24-hour oxygen, but does not feel that he needs it any longer and is wondering if he still needs to wear this.  Anemia: He is evaluated and followed by oncology as he does have a history of CLL.  His wife tells me they are due to see the oncologist again next week to check blood work.  He has been undergoing IV iron infusions.  Hyperlipidemia: He does have a history of hyperlipidemia and does continue on statin as well as aspirin and Plavix therapy.  Last lipid panel was collected a while ago, he should be due for this today.  Vitamin D deficiency: He also has a history of vitamin D deficiency and continues on a vitamin D3 supplement.   Past Medical History:  Diagnosis Date  . Acute MI,  inferolateral wall, initial episode of care ([place]) 06/16/15   99% dCx   . BPH (benign prostatic hyperplasia) 04/08/2011  . CAD S/P percutaneous coronary angioplasty 11/23/12; 06/16/15   a. 2 overlapping mRCA lesions - Xience Xpedition DES 2.75 mm x 18 mm (3.0 mm);; b. Inf-Lat STEMI 06/16/2015 - dCx Asp Thrombectomy & PCI 3.0 x 15 Xience drDES (3.6 mm)  . Chronic headache   . CLL (chronic lymphocytic leukemia) ([place]) 04/08/2011  . Depression   . Diverticulitis   . Gallstone   . GERD (gastroesophageal reflux disease)   . Hypercholesteremia   . Ischemic cardiomyopathy    Echo 06/18/2015 EF 35-40% after lateral MI -> repeat echo 08/06/2015: EF 40-45% with basal-mid inferolateral akinesis and hypokinesis of the lateral wall.  . NSTEMI (non-ST elevated myocardial infarction) ([place]) 11/23/12   NSTEMI 11/23/2012 RCA lesion - PCI with DES; moderate LAD disease  . PTSD (post-traumatic stress disorder)    With Depression - since MI      Family History  Problem Relation Age of Onset  . Diabetes Mother   . Cancer Father   . Colon cancer Neg Hx     Social History   Social History Narrative   Exercises 3 to 4 days a week at the fitness center for about a half an hour at a time.   "former smoker". Does not drink EtOH  Married.   Social History   Tobacco Use  . Smoking status: Former Smoker    Packs/day: 0.25    Years: 2.00    Pack years: 0.50    Types: Cigarettes    Quit date: 06/07/1963    Years since quitting: 56.3  . Smokeless tobacco: Never Used  Substance Use Topics  . Alcohol use: No    Alcohol/week: 0.0 standard drinks    Comment: Occasionally drinks red wine     Current Meds  Medication Sig  . ascorbic acid (VITAMIN C) 500 MG tablet Take 1 tablet (500 mg total) by mouth daily.  . cholecalciferol (VITAMIN D) 1000 units tablet Take 1,000 Units by mouth once a week.  . furosemide (LASIX) 20 MG tablet Take 1 tablet (20 mg total) by mouth daily.  Marland Kitchen lisinopril (ZESTRIL) 5 MG tablet  TAKE 1 TABLET BY MOUTH EVERY DAY  . pantoprazole (PROTONIX) 40 MG tablet TAKE 1 TABLET BY MOUTH 30 MINUTES BEFORE A MEAL ONCE OR TWICE DAILY  . simvastatin (ZOCOR) 40 MG tablet Take 1 tablet (40 mg total) by mouth daily at 6 PM.  . tamsulosin (FLOMAX) 0.4 MG CAPS capsule TAKE 1 CAPSULE (0.4 MG TOTAL) BY MOUTH DAILY AFTER BREAKFAST.  Marland Kitchen vitamin B-12 (CYANOCOBALAMIN) 1000 MCG tablet Take 1 tablet (1,000 mcg total) by mouth daily.    ROS:  Review of Systems  Constitutional: Negative for fever.  Eyes: Negative for blurred vision.  Respiratory: Negative for cough, shortness of breath and wheezing.   Cardiovascular: Negative for chest pain.  Neurological: Negative for dizziness and headaches.     Objective:   Today's Vitals: BP 100/60 (BP Location: Left Arm, Patient Position: Sitting, Cuff Size: Normal)   Pulse (!) 56   Temp 98.2 F (36.8 C) (Temporal)   Ht 5' 8"  (1.727 m)   Wt 144 lb 6.4 oz (65.5 kg)   SpO2 95%   BMI 21.96 kg/m  Vitals with BMI 10/14/2019 08/19/2019 08/05/2019  Height 5' 8"  5' 8"  -  Weight 144 lbs 6 oz 140 lbs 13 oz -  BMI 76.54 65.03 -  Systolic 546 90 568  Diastolic 60 70 52  Pulse 56 74 68     Physical Exam Vitals reviewed.  Constitutional:      Appearance: Normal appearance.  HENT:     Head: Normocephalic and atraumatic.  Cardiovascular:     Rate and Rhythm: Normal rate and regular rhythm.  Pulmonary:     Effort: Pulmonary effort is normal.     Breath sounds: Normal breath sounds.  Musculoskeletal:     Cervical back: Neck supple.  Skin:    General: Skin is warm and dry.  Neurological:     Mental Status: He is alert and oriented to person, place, and time.  Psychiatric:        Mood and Affect: Mood normal.        Behavior: Behavior normal.        Thought Content: Thought content normal.        Judgment: Judgment normal.      Walking O2 sats: Patient walked approximately 125 feet in office on room air, O2 sats remained at 95% or higher during  the entire period of ambulation.    Assessment and Plan   1. Memory loss   2. Vitamin D deficiency   3. Benign essential HTN   4. Dyslipidemia, goal LDL below 70 - on simvastatin; monitored by PCP   5. Weakness   6. CLL (  chronic lymphocytic leukemia) ([place])   7. Personal history of covid-19      Plan: 1.  I will repeat vitamin B12 serum level today for further evaluation.  Patient has not been driving, and would like to start driving again when able.  I will refer him to occupational therapy to see if he would be safe to drive at this time.  2.  We will collect serum level today for further evaluation.  3.  Blood pressure well controlled he will continue on his current medication regimen as prescribed.  4.  We will collect lipid panel for further evaluation today.  5.  He appears to be back at baseline as far strength is concerned.  No additional management will be added to his treatment plan at this time.  6.  He will follow-up with his oncologist as scheduled next week.  7.  This appears to have resolved.  I recommended that he and his wife can discontinue use of oxygen as his walking O2 sats remained above 95%.   Tests ordered Orders Placed This Encounter  Procedures  . Vitamin B12  . CMP with eGFR([facility])  . Vitamin D, 25-hydroxy  . Lipid Panel  . Ambulatory referral to Occupational Therapy      No orders of the defined types were placed in this encounter.   Patient to follow-up in 6 to 8 weeks for annual Medicare wellness visit.  Ailene Ards, NP

## 2019-10-16 ENCOUNTER — Inpatient Hospital Stay (HOSPITAL_COMMUNITY): Payer: Medicare HMO | Attending: Hematology

## 2019-10-16 ENCOUNTER — Other Ambulatory Visit: Payer: Self-pay

## 2019-10-16 DIAGNOSIS — Z79899 Other long term (current) drug therapy: Secondary | ICD-10-CM | POA: Diagnosis not present

## 2019-10-16 DIAGNOSIS — I2510 Atherosclerotic heart disease of native coronary artery without angina pectoris: Secondary | ICD-10-CM | POA: Diagnosis not present

## 2019-10-16 DIAGNOSIS — C9110 Chronic lymphocytic leukemia of B-cell type not having achieved remission: Secondary | ICD-10-CM

## 2019-10-16 DIAGNOSIS — Z833 Family history of diabetes mellitus: Secondary | ICD-10-CM | POA: Diagnosis not present

## 2019-10-16 DIAGNOSIS — R05 Cough: Secondary | ICD-10-CM | POA: Diagnosis not present

## 2019-10-16 DIAGNOSIS — E7800 Pure hypercholesterolemia, unspecified: Secondary | ICD-10-CM | POA: Insufficient documentation

## 2019-10-16 DIAGNOSIS — Z87891 Personal history of nicotine dependence: Secondary | ICD-10-CM | POA: Diagnosis not present

## 2019-10-16 DIAGNOSIS — I5042 Chronic combined systolic (congestive) and diastolic (congestive) heart failure: Secondary | ICD-10-CM | POA: Diagnosis not present

## 2019-10-16 DIAGNOSIS — N400 Enlarged prostate without lower urinary tract symptoms: Secondary | ICD-10-CM | POA: Diagnosis not present

## 2019-10-16 DIAGNOSIS — I252 Old myocardial infarction: Secondary | ICD-10-CM | POA: Insufficient documentation

## 2019-10-16 DIAGNOSIS — Z809 Family history of malignant neoplasm, unspecified: Secondary | ICD-10-CM | POA: Insufficient documentation

## 2019-10-16 DIAGNOSIS — N183 Chronic kidney disease, stage 3 unspecified: Secondary | ICD-10-CM | POA: Diagnosis not present

## 2019-10-16 DIAGNOSIS — K219 Gastro-esophageal reflux disease without esophagitis: Secondary | ICD-10-CM | POA: Diagnosis not present

## 2019-10-16 DIAGNOSIS — D649 Anemia, unspecified: Secondary | ICD-10-CM

## 2019-10-16 DIAGNOSIS — D631 Anemia in chronic kidney disease: Secondary | ICD-10-CM | POA: Diagnosis not present

## 2019-10-16 LAB — COMPREHENSIVE METABOLIC PANEL
ALT: 15 U/L (ref 0–44)
AST: 22 U/L (ref 15–41)
Albumin: 3.9 g/dL (ref 3.5–5.0)
Alkaline Phosphatase: 39 U/L (ref 38–126)
Anion gap: 7 (ref 5–15)
BUN: 18 mg/dL (ref 8–23)
CO2: 27 mmol/L (ref 22–32)
Calcium: 9.4 mg/dL (ref 8.9–10.3)
Chloride: 107 mmol/L (ref 98–111)
Creatinine, Ser: 1.41 mg/dL — ABNORMAL HIGH (ref 0.61–1.24)
GFR calc Af Amer: 56 mL/min — ABNORMAL LOW (ref >60)
GFR calc non Af Amer: 48 mL/min — ABNORMAL LOW (ref >60)
Glucose, Bld: 89 mg/dL (ref 70–99)
Potassium: 4.1 mmol/L (ref 3.5–5.1)
Sodium: 141 mmol/L (ref 135–145)
Total Bilirubin: 0.9 mg/dL (ref 0.3–1.2)
Total Protein: 6.6 g/dL (ref 6.5–8.1)

## 2019-10-16 LAB — CBC WITH DIFFERENTIAL/PLATELET
Abs Immature Granulocytes: 0.02 10*3/uL (ref 0.00–0.07)
Basophils Absolute: 0.1 10*3/uL (ref 0.0–0.1)
Basophils Relative: 1 %
Eosinophils Absolute: 0.2 10*3/uL (ref 0.0–0.5)
Eosinophils Relative: 2 %
HCT: 43.5 % (ref 39.0–52.0)
Hemoglobin: 13.1 g/dL (ref 13.0–17.0)
Immature Granulocytes: 0 %
Lymphocytes Relative: 43 %
Lymphs Abs: 4 10*3/uL (ref 0.7–4.0)
MCH: 28.7 pg (ref 26.0–34.0)
MCHC: 30.1 g/dL (ref 30.0–36.0)
MCV: 95.4 fL (ref 80.0–100.0)
Monocytes Absolute: 0.8 10*3/uL (ref 0.1–1.0)
Monocytes Relative: 8 %
Neutro Abs: 4.3 10*3/uL (ref 1.7–7.7)
Neutrophils Relative %: 46 %
Platelets: 203 10*3/uL (ref 150–400)
RBC: 4.56 MIL/uL (ref 4.22–5.81)
RDW: 14.6 % (ref 11.5–15.5)
WBC: 9.3 10*3/uL (ref 4.0–10.5)
nRBC: 0 % (ref 0.0–0.2)

## 2019-10-16 LAB — FERRITIN: Ferritin: 343 ng/mL — ABNORMAL HIGH (ref 24–336)

## 2019-10-16 LAB — IRON AND TIBC
Iron: 88 ug/dL (ref 45–182)
Saturation Ratios: 34 % (ref 17.9–39.5)
TIBC: 263 ug/dL (ref 250–450)
UIBC: 175 ug/dL

## 2019-10-16 LAB — LACTATE DEHYDROGENASE: LDH: 122 U/L (ref 98–192)

## 2019-10-23 ENCOUNTER — Other Ambulatory Visit: Payer: Self-pay

## 2019-10-23 ENCOUNTER — Inpatient Hospital Stay (HOSPITAL_COMMUNITY): Payer: Medicare HMO | Admitting: Hematology

## 2019-10-23 ENCOUNTER — Encounter (HOSPITAL_COMMUNITY): Payer: Self-pay | Admitting: Hematology

## 2019-10-23 VITALS — BP 128/57 | HR 58 | Temp 97.7°F | Resp 18 | Wt 149.3 lb

## 2019-10-23 DIAGNOSIS — D649 Anemia, unspecified: Secondary | ICD-10-CM | POA: Diagnosis not present

## 2019-10-23 DIAGNOSIS — C9110 Chronic lymphocytic leukemia of B-cell type not having achieved remission: Secondary | ICD-10-CM | POA: Diagnosis not present

## 2019-10-23 NOTE — Patient Instructions (Signed)
[place] at [facility] Discharge Instructions  You were seen today by Dr. Delton Coombes. He went over your recent results. Your blood work looked good today. He will see you back in 6 months for labs and follow up.   Thank you for choosing [place] at [facility] to provide your oncology and hematology care.  To afford each patient quality time with our provider, please arrive at least 15 minutes before your scheduled appointment time.   If you have a lab appointment with the [facility] please come in thru the  Main Entrance and check in at the main information desk  You need to re-schedule your appointment should you arrive 10 or more minutes late.  We strive to give you quality time with our providers, and arriving late affects you and other patients whose appointments are after yours.  Also, if you no show three or more times for appointments you may be dismissed from the clinic at the providers discretion.     Again, thank you for choosing [facility].  Our hope is that these requests will decrease the amount of time that you wait before being seen by our physicians.       _____________________________________________________________  Should you have questions after your visit to [facility], please contact our office at [phone] between the hours of 8:00 a.m. and 4:30 p.m.  Voicemails left after 4:00 p.m. will not be returned until the following business day.  For prescription refill requests, have your pharmacy contact our office and allow 72 hours.    Cancer Center Support Programs:   > Cancer Support Group  2nd Tuesday of the month 1pm-2pm, Journey Room

## 2019-10-23 NOTE — Progress Notes (Signed)
[name],  [address]   CLINIC:  Medical Oncology/Hematology  PCP:  [name] Albee, MD [place] / [place] [address]  [phone]  REASON FOR VISIT:  Follow-up for Chronic lymphocytic leukemia CLL  CURRENT THERAPY: Observation   INTERVAL HISTORY:  [name] 74 y.o. male returns for routine follow-up for his chronic lymphocytic leukemia CLL. [name] was last seen on 07/16/2019.  He occasionally has a light cough.  Denies any fevers, night sweats or weight loss.  Denies any recurrent infections.  Appetite and energy levels are 75%.   REVIEW OF SYSTEMS:  Review of Systems  Constitutional: Positive for appetite change (mild, decreased) and fatigue (mild). Negative for chills and fever.  HENT:   Negative for lump/mass, mouth sores, sore throat and trouble swallowing.   Eyes: Negative for eye problems.  Respiratory: Positive for cough (mild, occasional). Negative for chest tightness, shortness of breath and wheezing.   Cardiovascular: Negative for chest pain and palpitations.  Gastrointestinal: Negative for abdominal pain, constipation, diarrhea, nausea and vomiting.  Genitourinary: Negative for bladder incontinence, dysuria, frequency and hematuria.   Musculoskeletal: Negative for arthralgias, back pain, flank pain and myalgias.  Skin: Negative for rash.  Neurological: Negative for dizziness, headaches, light-headedness and numbness.  Hematological: Does not bruise/bleed easily.  Psychiatric/Behavioral: Negative for depression. The patient is not nervous/anxious.     PAST MEDICAL/SURGICAL HISTORY:  Past Medical History:  Diagnosis Date  . Acute MI, inferolateral wall, initial episode of care ([place]) 06/16/15   99% dCx   . BPH (benign prostatic hyperplasia) 04/08/2011  . CAD S/P percutaneous coronary angioplasty 11/23/12; 06/16/15   a. NSTEMI 2014 with 2 overlapping mRCA lesions - Xience Xpedition DES 2.75 mm x 18 mm (3.0 mm).  b. Inf-Lat STEMI 06/16/2015 - dCx Asp Thrombectomy & PCI 3.0 x 15 Xience drDES (3.6 mm).  . Chronic combined systolic and diastolic CHF (congestive heart failure) ([place])   . Chronic headache   . CKD (chronic kidney disease), stage III   . CLL (chronic lymphocytic leukemia) ([place]) 04/08/2011  . Depression   . Diverticulitis   . Gallstone   . GERD (gastroesophageal reflux disease)   . Hypercholesteremia   . Ischemic cardiomyopathy    Echo 06/18/2015 EF 35-40% after lateral MI -> repeat echo 08/06/2015: EF 40-45% with basal-mid inferolateral akinesis and hypokinesis of the lateral wall.  . NSTEMI (non-ST elevated myocardial infarction) ([place]) 11/23/12   NSTEMI 11/23/2012 RCA lesion - PCI with DES; moderate LAD disease  . PTSD (post-traumatic stress disorder)    With Depression - since MI   Past Surgical History:  Procedure Laterality Date  . APPENDECTOMY  1968-70  . CARDIAC CATHETERIZATION N/A 06/16/2015   Procedure: Left Heart Cath and Coronary Angiography;  Surgeon: Jettie Booze, MD;  Location: [facility];  Service: Cardiovascular: 99% thrombotic dCx -> asp thrombectomy & PCI. RCA Stents Patent.   Marland Kitchen CARDIAC CATHETERIZATION N/A 06/16/2015   Procedure: Coronary Stent Intervention;  Surgeon: Jettie Booze, MD;  Location: [facility];  Service: Cardiovascular: PCI dCx = thrombectomy -> 3.0 x 15 Xience drug-eluting stent (3.6 mm)   . COLONOSCOPY  2008   [id number] diverticula, diminutive polyp in the cecum, s/p bx Normal rectum. Tubular adenoma  . COLONOSCOPY N/A 10/17/2012   Dr. Gala Romney: colonic diverticulosis, tubular adenoma, surveillance due May 2019  . COLONOSCOPY N/A 02/21/2018   Procedure: COLONOSCOPY;  Surgeon: Daneil Dolin, MD;  Location: [facility] SU[facility];  Service: Endoscopy;  Laterality: N/A;  12:00  . CORONARY STENT PLACEMENT  11/23/12   Mid RCA -- Xience eXp - 2.75 mm x 18 mm DES (3.36mm) , Dr. Ellyn Hack  . IR IVC FILTER PLMT / S&I /IMG GUID/MOD SED  05/27/2019    . IR RADIOLOGIST EVAL & MGMT  09/11/2019  . LEFT HEART CATHETERIZATION WITH CORONARY ANGIOGRAM N/A 11/23/2012   Procedure: LEFT HEART CATHETERIZATION WITH CORONARY ANGIOGRAM;  Surgeon: Leonie Man, MD;  Location: [facility] CATH LAB;  Service: Cardiovascular: NSTEMI: Culprit = mid RCA 95% & 75%; LAD ~40-50%, Small RI ~60%; EF ~60%, mild inf-basal HK     . TRANSTHORACIC ECHOCARDIOGRAM  January 2017; March 2017   a. EF 35-40%. Entire inferior hypokinesis and akinesis of the basal and mid inferolateral and anterolateral /distal lateral walls;; b. EF 40 and 45%. Akinesis of basal-mid inferolateral wall. Hypokinesis of entire lateral wall.    SOCIAL HISTORY:  Social History   Socioeconomic History  . Marital status: Married    Spouse name: Not on file  . Number of children: Not on file  . Years of education: Not on file  . Highest education level: Not on file  Occupational History  . Not on file  Tobacco Use  . Smoking status: Former Smoker    Packs/day: 0.25    Years: 2.00    Pack years: 0.50    Types: Cigarettes    Quit date: 06/07/1963    Years since quitting: 56.4  . Smokeless tobacco: Never Used  Substance and Sexual Activity  . Alcohol use: No    Alcohol/week: 0.0 standard drinks    Comment: Occasionally drinks red wine  . Drug use: No  . Sexual activity: Not on file  Other Topics Concern  . Not on file  Social History Narrative   Exercises 3 to 4 days a week at the fitness center for about a half an hour at a time.   "former smoker". Does not drink EtOH   Married.   Social Determinants of Health   Financial Resource Strain:   . Difficulty of Paying Living Expenses:   Food Insecurity:   . Worried About Charity fundraiser in the Last Year:   . Arboriculturist in the Last Year:   Transportation Needs:   . Film/video editor (Medical):   Marland Kitchen Lack of Transportation (Non-Medical):   Physical Activity:   . Days of Exercise per Week:   . Minutes of Exercise per Session:    Stress:   . Feeling of Stress :   Social Connections:   . Frequency of Communication with Friends and Family:   . Frequency of Social Gatherings with Friends and Family:   . Attends Religious Services:   . Active Member of Clubs or Organizations:   . Attends Archivist Meetings:   Marland Kitchen Marital Status:   Intimate Partner Violence:   . Fear of Current or Ex-Partner:   . Emotionally Abused:   Marland Kitchen Physically Abused:   . Sexually Abused:     FAMILY HISTORY:  Family History  Problem Relation Age of Onset  . Diabetes Mother   . Cancer Father   . Colon cancer Neg Hx     CURRENT MEDICATIONS:  Current Outpatient Medications  Medication Sig Dispense Refill  . ascorbic acid (VITAMIN C) 500 MG tablet Take 1 tablet (500 mg total) by mouth daily.    . cholecalciferol (VITAMIN D) 1000 units tablet Take 1,000  Units by mouth once a week.    . clopidogrel (PLAVIX) 75 MG tablet Take 1 tablet (75 mg total) by mouth daily. 90 tablet 1  . furosemide (LASIX) 20 MG tablet Take 1 tablet (20 mg total) by mouth daily. 30 tablet 3  . pantoprazole (PROTONIX) 40 MG tablet TAKE 1 TABLET BY MOUTH 30 MINUTES BEFORE A MEAL ONCE OR TWICE DAILY 180 tablet 1  . simvastatin (ZOCOR) 40 MG tablet Take 1 tablet (40 mg total) by mouth daily at 6 PM. 90 tablet 0  . tamsulosin (FLOMAX) 0.4 MG CAPS capsule TAKE 1 CAPSULE (0.4 MG TOTAL) BY MOUTH DAILY AFTER BREAKFAST. 90 capsule 1  . vitamin B-12 (CYANOCOBALAMIN) 1000 MCG tablet Take 1 tablet (1,000 mcg total) by mouth daily. 90 tablet 0  . lisinopril (ZESTRIL) 2.5 MG tablet Take 1 tablet (2.5 mg total) by mouth daily. 90 tablet 3   No current facility-administered medications for this visit.    ALLERGIES:  No Known Allergies  PHYSICAL EXAM:  Performance status (ECOG): 1 - Symptomatic but completely ambulatory  Vitals:   10/23/19 1124  BP: (!) 128/57  Pulse: (!) 58  Resp: 18  Temp: 97.7 F (36.5 C)  SpO2: 98%   Wt Readings from Last 3 Encounters:   10/25/19 149 lb (67.6 kg)  10/23/19 149 lb 4.8 oz (67.7 kg)  10/14/19 144 lb 6.4 oz (65.5 kg)   Physical Exam Constitutional:      Appearance: Normal appearance.  HENT:     Nose: No congestion.     Mouth/Throat:     Mouth: Mucous membranes are moist.  Eyes:     Extraocular Movements: Extraocular movements intact.     Pupils: Pupils are equal, round, and reactive to light.  Cardiovascular:     Rate and Rhythm: Normal rate and regular rhythm.     Heart sounds: No murmur. No gallop.   Pulmonary:     Breath sounds: No wheezing, rhonchi or rales.  Abdominal:     Tenderness: There is no abdominal tenderness.  Musculoskeletal:        General: No tenderness.     Cervical back: Normal range of motion. No tenderness.     Right lower leg: Edema present.     Left lower leg: Edema present.  Skin:    General: Skin is warm and dry.     Findings: No bruising, erythema or rash.  Neurological:     Mental Status: He is alert and oriented to person, place, and time.     Sensory: No sensory deficit.     Motor: No weakness.  Psychiatric:        Mood and Affect: Mood normal.        Behavior: Behavior normal.        Thought Content: Thought content normal.        Judgment: Judgment normal.     LABORATORY DATA:  I have reviewed the labs as listed.  CBC Latest Ref Rng & Units 10/16/2019 07/16/2019 07/09/2019  WBC 4.0 - 10.5 K/uL 9.3 11.2(H) 12.2(H)  Hemoglobin 13.0 - 17.0 g/dL 13.1 10.3(L) 10.9(L)  Hematocrit 39.0 - 52.0 % 43.5 34.1(L) 34.5(L)  Platelets 150 - 400 K/uL 203 318 263   CMP Latest Ref Rng & Units 10/16/2019 10/14/2019 07/16/2019  Glucose 70 - 99 mg/dL 89 92 145(H)  BUN 8 - 23 mg/dL 18 16 21   Creatinine 0.61 - 1.24 mg/dL 1.41(H) 1.47(H) 1.21  Sodium 135 - 145 mmol/L 141 144 139  Potassium 3.5 - 5.1 mmol/L 4.1 4.6 3.8  Chloride 98 - 111 mmol/L 107 107 106  CO2 22 - 32 mmol/L 27 30 25   Calcium 8.9 - 10.3 mg/dL 9.4 9.9 8.6(L)  Total Protein 6.5 - 8.1 g/dL 6.6 6.3 5.9(L)  Total  Bilirubin 0.3 - 1.2 mg/dL 0.9 0.7 0.5  Alkaline Phos 38 - 126 U/L 39 - 70  AST 15 - 41 U/L 22 17 17   ALT 0 - 44 U/L 15 9 10        Component Value Date/Time   RBC 4.56 10/16/2019 1304   MCV 95.4 10/16/2019 1304   MCH 28.7 10/16/2019 1304   MCHC 30.1 10/16/2019 1304   RDW 14.6 10/16/2019 1304   LYMPHSABS 4.0 10/16/2019 1304   MONOABS 0.8 10/16/2019 1304   EOSABS 0.2 10/16/2019 1304   BASOSABS 0.1 10/16/2019 1304    DIAGNOSTIC IMAGING:  I have independently reviewed the scans and discussed with the patient.  ASSESSMENT & PLAN:  CLL (chronic lymphocytic leukemia) ([place]) 1.  Stage I CLL: -Originally diagnosed in 2008, on watchful waiting since then. -Denies any fevers, night sweats or weight loss. -We reviewed labs from 10/16/2019.  White count is 9.3 and hemoglobin is 13.1 platelet count 203.  Differential was normal. -No palpable adenopathy or splenomegaly. -I explained to him that CLL can be waxing and waning.  We will see him back in 6 months for follow-up.  2.  DVT: -Diagnosed with DVT during hospitalization in December 2020.  IVC filter on 05/27/2019. -Anticoagulation was not started as he had GI bleed secondary to diverticulosis.  3.  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 -Venofer 5 doses from 07/23/2019 through 08/05/2019. -Hemoglobin is 13.1.  Ferritin is 343 and percent saturation is 34.  B12 was normal.  No parenteral iron needed at this time.    Orders placed this encounter:  Orders Placed This Encounter  Procedures  . CBC with Differential  . Comprehensive metabolic panel  . Ferritin  . Iron and TIBC        Derek Jack, MD, 10/25/19 4:46 PM  [place] [phone]   I, Jacqualyn Posey, am acting as a scribe for Dr. Sanda Linger.  I, Derek Jack MD, have reviewed the above documentation for accuracy and completeness, and I agree with the above.

## 2019-10-24 ENCOUNTER — Encounter: Payer: Self-pay | Admitting: Physician Assistant

## 2019-10-24 NOTE — Progress Notes (Signed)
Virtual Visit via Telephone Note   This visit type was conducted due to national recommendations for restrictions regarding the COVID-19 Pandemic (e.g. social distancing) in an effort to limit this patient's exposure and mitigate transmission in our community.  Due to his co-morbid illnesses, this patient is at least at moderate risk for complications without adequate follow up.  This format is felt to be most appropriate for this patient at this time.  The patient did not have access to video technology/had technical difficulties with video requiring transitioning to audio format only (telephone).  All issues noted in this document were discussed and addressed.  No physical exam could be performed with this format.  Please refer to the patient's chart for his  consent to telehealth for [facility]. The patient was identified using 2 identifiers.  Date:  10/25/2019   ID:  [name], DOB 03-03-1946, MRN [id number]  Patient Location: Home Provider Location: Office  PCP:  [name], MD  Cardiologist:  Glenetta Hew, MD  Electrophysiologist:  None   Evaluation Performed:  Follow-Up Visit  Chief Complaint:  F/u CAD  History of Present Illness:    [name] is a 74 y.o. male with CAD (NSTEMI s/p overlapping DES to RCA 2014, inferolateral STEMI 06/2015 s/p DES to distal Cx), chronic combined CHF with ICM, CKD stage III by labs, HTN, HLD, CLL, BPH, HLD, PTSD with depression who presents for routine follow-up virtually. He is not on beta blocker due to resting HR in the 50s. Last echo in 08/2015 showed EF 40-45%, akinesis of the basal-mid inferolateral myocardium, hypokinesis of the entire lateral myocardium, grade 1 DD. Last labs 10/2019 showed K 4.1, Cr 1.41, normal LFTs, Hgb 13.1, LDL 77 (followed by primary care), 07/2019 normal thyroid.  He is on the phone today with wife [name]. He saw primary care earlier this month and they have been monitoring him for memory  loss/forgetfulness lately. He reports he is doing well from a cardiac standpoint and denies any CP, SOB, edema, palpitations or orthopnea. He stopped taking his aspirin but not for any particular reason - still takes it occasionally. He reports compliance with his other meds. His initial BP was 101/56. I had him recheck it and it was 107/56. He is not having any symptoms related to this.  Past Medical History:  Diagnosis Date  . Acute MI, inferolateral wall, initial episode of care ([place]) 06/16/15   99% dCx   . BPH (benign prostatic hyperplasia) 04/08/2011  . CAD S/P percutaneous coronary angioplasty 11/23/12; 06/16/15   a. NSTEMI 2014 with 2 overlapping mRCA lesions - Xience Xpedition DES 2.75 mm x 18 mm (3.0 mm). b. Inf-Lat STEMI 06/16/2015 - dCx Asp Thrombectomy & PCI 3.0 x 15 Xience drDES (3.6 mm).  . Chronic combined systolic and diastolic CHF (congestive heart failure) ([place])   . Chronic headache   . CKD (chronic kidney disease), stage III   . CLL (chronic lymphocytic leukemia) ([place]) 04/08/2011  . Depression   . Diverticulitis   . Gallstone   . GERD (gastroesophageal reflux disease)   . Hypercholesteremia   . Ischemic cardiomyopathy    Echo 06/18/2015 EF 35-40% after lateral MI -> repeat echo 08/06/2015: EF 40-45% with basal-mid inferolateral akinesis and hypokinesis of the lateral wall.  . NSTEMI (non-ST elevated myocardial infarction) ([place]) 11/23/12   NSTEMI 11/23/2012 RCA lesion - PCI with DES; moderate LAD disease  . PTSD (post-traumatic stress disorder)    With Depression - since MI  Past Surgical History:  Procedure Laterality Date  . APPENDECTOMY  1968-70  . CARDIAC CATHETERIZATION N/A 06/16/2015   Procedure: Left Heart Cath and Coronary Angiography;  Surgeon: Jettie Booze, MD;  Location: [facility];  Service: Cardiovascular: 99% thrombotic dCx -> asp thrombectomy & PCI. RCA Stents Patent.   Marland Kitchen CARDIAC CATHETERIZATION N/A 06/16/2015   Procedure: Coronary Stent  Intervention;  Surgeon: Jettie Booze, MD;  Location: [facility];  Service: Cardiovascular: PCI dCx = thrombectomy -> 3.0 x 15 Xience drug-eluting stent (3.6 mm)   . COLONOSCOPY  2008   [id number] diverticula, diminutive polyp in the cecum, s/p bx Normal rectum. Tubular adenoma  . COLONOSCOPY N/A 10/17/2012   Dr. Gala Romney: colonic diverticulosis, tubular adenoma, surveillance due May 2019  . COLONOSCOPY N/A 02/21/2018   Procedure: COLONOSCOPY;  Surgeon: Daneil Dolin, MD;  Location[facility];  Service: Endoscopy;  Laterality: N/A;  12:00  . CORONARY STENT PLACEMENT  11/23/12   Mid RCA -- Xience eXp - 2.75 mm x 18 mm DES (3.2mm) , Dr. Ellyn Hack  . IR IVC FILTER PLMT / S&I /IMG GUID/MOD SED  05/27/2019  . IR RADIOLOGIST EVAL & MGMT  09/11/2019  . LEFT HEART CATHETERIZATION WITH CORONARY ANGIOGRAM N/A 11/23/2012   Procedure: LEFT HEART CATHETERIZATION WITH CORONARY ANGIOGRAM;  Surgeon: Leonie Man, MD;  Location: [facility];  Service: Cardiovascular: NSTEMI: Culprit = mid RCA 95% & 75%; LAD ~40-50%, Small RI ~60%; EF ~60%, mild inf-basal HK     . TRANSTHORACIC ECHOCARDIOGRAM  January 2017; March 2017   a. EF 35-40%. Entire inferior hypokinesis and akinesis of the basal and mid inferolateral and anterolateral /distal lateral walls;; b. EF 40 and 45%. Akinesis of basal-mid inferolateral wall. Hypokinesis of entire lateral wall.     Current Meds  Medication Sig  . ascorbic acid (VITAMIN C) 500 MG tablet Take 1 tablet (500 mg total) by mouth daily.  . cholecalciferol (VITAMIN D) 1000 units tablet Take 1,000 Units by mouth once a week.  . clopidogrel (PLAVIX) 75 MG tablet Take 1 tablet (75 mg total) by mouth daily.  . furosemide (LASIX) 20 MG tablet Take 1 tablet (20 mg total) by mouth daily.  . pantoprazole (PROTONIX) 40 MG tablet TAKE 1 TABLET BY MOUTH 30 MINUTES BEFORE A MEAL ONCE OR TWICE DAILY  . simvastatin (ZOCOR) 40 MG tablet Take 1 tablet (40 mg total) by mouth daily at 6  PM.  . tamsulosin (FLOMAX) 0.4 MG CAPS capsule TAKE 1 CAPSULE (0.4 MG TOTAL) BY MOUTH DAILY AFTER BREAKFAST.  Marland Kitchen vitamin B-12 (CYANOCOBALAMIN) 1000 MCG tablet Take 1 tablet (1,000 mcg total) by mouth daily.  . [DISCONTINUED] lisinopril (ZESTRIL) 5 MG tablet TAKE 1 TABLET BY MOUTH EVERY DAY     Allergies:   Patient has no known allergies.   Social History   Tobacco Use  . Smoking status: Former Smoker    Packs/day: 0.25    Years: 2.00    Pack years: 0.50    Types: Cigarettes    Quit date: 06/07/1963    Years since quitting: 56.4  . Smokeless tobacco: Never Used  Substance Use Topics  . Alcohol use: No    Alcohol/week: 0.0 standard drinks    Comment: Occasionally drinks red wine  . Drug use: No     Family Hx: The patient's family history includes Cancer in his father; Diabetes in his mother. There is no history of Colon cancer.  ROS:   Please  see the history of present illness.    All other systems reviewed and are negative.   Prior CV studies:   The following studies were reviewed today:  2D echo 08/2015 - Left ventricle: The cavity size was normal. Wall thickness was  normal. Systolic function was mildly to moderately reduced. The  estimated ejection fraction was in the range of 40% to 45%. There  is akinesis of the basal-midinferolateral myocardium. There is  hypokinesis of the entirelateral myocardium. Doppler parameters  are consistent with abnormal left ventricular relaxation (grade 1  diastolic dysfunction).  - Mitral valve: There was mild regurgitation.   LHC 06/2015  Patent stent in the RCA  Dist Cx thrombotic lesion, 99% stenosed which was the culprit for today's presentation. Post intervention with aspiration thrombectomy and a 3.0 x 15 Xience drug-eluting stent, postdilated to 3.6 mm, there is a 0% residual stenosis.  There is moderate left ventricular systolic dysfunction.   Continue dual antiplatelet therapy for at least a year. Will change  clopidogrel to Brilinta 90 mg twice a day. Continue IV tirofiban for 2 hours postprocedure. He'll need aggressive medical therapy for his LV dysfunction. Continue statin and beta blocker as well. He'll be watched in the ICU.       Labs/Other Tests and Data Reviewed:    EKG:  An ECG dated 05/15/19 was personally reviewed today and demonstrated:  NSR 79bpm, one PAC and one PVC, prior inferior infarct, nonspecific STT changes  Recent Labs: 05/16/2019: B Natriuretic Peptide 191.5 05/25/2019: Magnesium 2.1 07/09/2019: TSH 1.19 10/16/2019: ALT 15; BUN 18; Creatinine, Ser 1.41; Hemoglobin 13.1; Platelets 203; Potassium 4.1; Sodium 141   Recent Lipid Panel Lab Results  Component Value Date/Time   CHOL 139 10/14/2019 02:26 PM   CHOL 129 12/14/2016 10:07 AM   TRIG 143 10/14/2019 02:26 PM   HDL 38 (L) 10/14/2019 02:26 PM   HDL 32 (L) 12/14/2016 10:07 AM   CHOLHDL 3.7 10/14/2019 02:26 PM   LDLCALC 77 10/14/2019 02:26 PM    Wt Readings from Last 3 Encounters:  10/25/19 149 lb (67.6 kg)  10/23/19 149 lb 4.8 oz (67.7 kg)  10/14/19 144 lb 6.4 oz (65.5 kg)     Objective:    Vital Signs:  BP (!) 107/56   Pulse (!) 55   Ht 5\' 9"  (1.753 m)   Wt 149 lb (67.6 kg)   BMI 22.00 kg/m    VS reviewed. General - adult M in no acute distress Pulm - No labored breathing, no coughing during visit, no audible wheezing, speaking in full sentences Neuro - A+Ox3, no slurred speech, answers questions appropriately Psych - Pleasant affect  ASSESSMENT & PLAN:    1. CAD - doing well clinically. He remains on Plavix and statin. He is not on BB due to resting bradycardia. He self-discontinued aspirin but for no specific reason - still takes every once in a while. I will reach out to Dr. Ellyn Hack to find out how he feels about Plavix monotherapy. I told the patient to take the aspirin for now until we hear back. 2. Ischemic cardiomyopathy with chronic combined CHF - he denies any recent issues with fluid  retention. Stable on current regimen. Reviewed 2g sodium restriction, 2L fluid restriction, daily weights with patient. Not on BB for reasons above. I do not think his blood pressure would support more aggressive medication titration or Entresto. In fact, based on BP today I asked him to decrease his lisinopril to 2.5mg  daily. 3. CKD stage  III by labs - reviewed diagnosis with patient, instructed to avoid NSAIDS. 4. Essential HTN - decrease lisinopril as above. I told him to periodically monitor his BP and let us know if it tends to run less than [id number] systolic with this change. It was 100/60 when he saw PCP at which time labs were relatively stable. 5. Hyperlipidemia - LDL not completely at goal by recent labs. It was 77 with goal LDL <70. We discussed changing his cholesterol medication but the patient wishes to work on diet. He eats a lot of ice cream so will be making adjustments. Plan for rechecking can be discussed at time of follow-up.   Time:   Today, I have spent 17 minutes with the patient with telehealth technology discussing the above problems.     Medication Adjustments/Labs and Tests Ordered: Current medicines are reviewed at length with the patient today.  Testing and concerns regarding medicines are outlined above.    Follow Up:  In Person 6 months with Dr. Ellyn Hack  Signed, Charlie Pitter, PA-C  10/25/2019 4:36 PM    [facility]

## 2019-10-25 ENCOUNTER — Encounter: Payer: Self-pay | Admitting: Physician Assistant

## 2019-10-25 ENCOUNTER — Telehealth (INDEPENDENT_AMBULATORY_CARE_PROVIDER_SITE_OTHER): Payer: Medicare HMO | Admitting: Physician Assistant

## 2019-10-25 ENCOUNTER — Other Ambulatory Visit: Payer: Self-pay

## 2019-10-25 VITALS — BP 107/56 | HR 55 | Ht 69.0 in | Wt 149.0 lb

## 2019-10-25 DIAGNOSIS — I2510 Atherosclerotic heart disease of native coronary artery without angina pectoris: Secondary | ICD-10-CM | POA: Diagnosis not present

## 2019-10-25 DIAGNOSIS — I255 Ischemic cardiomyopathy: Secondary | ICD-10-CM | POA: Diagnosis not present

## 2019-10-25 DIAGNOSIS — I5042 Chronic combined systolic (congestive) and diastolic (congestive) heart failure: Secondary | ICD-10-CM

## 2019-10-25 DIAGNOSIS — I10 Essential (primary) hypertension: Secondary | ICD-10-CM

## 2019-10-25 DIAGNOSIS — E785 Hyperlipidemia, unspecified: Secondary | ICD-10-CM

## 2019-10-25 DIAGNOSIS — N183 Chronic kidney disease, stage 3 unspecified: Secondary | ICD-10-CM | POA: Diagnosis not present

## 2019-10-25 MED ORDER — LISINOPRIL 2.5 MG PO TABS: 2.5000 mg | ORAL_TABLET | Freq: Every day | ORAL | 3 refills | Status: DC

## 2019-10-25 NOTE — Patient Instructions (Addendum)
   You may use up your 5mg  tablets of lisinopril for now if you want to by taking 1/2 tablet of them daily. Just remember when you pick up the next prescription, it will be for the lower dose of 2.5mg  daily taking 1 tablet daily.   For patients with history of fluid retention, we give them these special instructions:  1. Follow a low-salt diet - you are allowed no more than 2,000mg  of sodium per day. Watch your fluid intake. In general, you should not be taking in more than 2 liters of fluid per day (no more than 8 glasses per day). This includes sources of water in foods like soup, coffee, tea, milk, etc. 2. Weigh yourself on the same scale at same time of day and keep a log. 3. Call your doctor: (Anytime you feel any of the following symptoms)  - 3lb weight gain overnight or 5lb within a few days - Shortness of breath, with or without a dry hacking cough  - Swelling in the hands, feet or stomach  - If you have to sleep on extra pillows at night in order to breathe   IT IS IMPORTANT TO LET YOUR DOCTOR KNOW EARLY ON IF YOU ARE HAVING SYMPTOMS SO WE CAN HELP YOU!   If you notice your blood pressure continuing to run less than 110 on the top number, please call our office at [phone].      Medication Instructions: DECREASE Lisinopril to 2.5 mg daily   Labwork: None today  Procedures/Testing: None today  Follow-Up: Office visit 6 months with Dr.Harding  Any Additional Special Instructions Will Be Listed Below (If Applicable).     If you need a refill on your cardiac medications before your next appointment, please call your pharmacy.          Thank you for choosing [facility] !

## 2019-10-25 NOTE — Assessment & Plan Note (Signed)
1.  Stage I CLL: -Originally diagnosed in 2008, on watchful waiting since then. -Denies any fevers, night sweats or weight loss. -We reviewed labs from 10/16/2019.  White count is 9.3 and hemoglobin is 13.1 platelet count 203.  Differential was normal. -No palpable adenopathy or splenomegaly. -I explained to him that CLL can be waxing and waning.  We will see him back in 6 months for follow-up.  2.  DVT: -Diagnosed with DVT during hospitalization in December 2020.  IVC filter on 05/27/2019. -Anticoagulation was not started as he had GI bleed secondary to diverticulosis.  3.  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 -Venofer 5 doses from 07/23/2019 through 08/05/2019. -Hemoglobin is 13.1.  Ferritin is 343 and percent saturation is 34.  B12 was normal.  No parenteral iron needed at this time.

## 2019-10-28 ENCOUNTER — Telehealth: Payer: Self-pay | Admitting: Physician Assistant

## 2019-10-28 NOTE — Telephone Encounter (Signed)
Please let pt know I discussed his blood thinner regimen with Dr. Ellyn Hack. Lately the patient had not been taking aspirin consistently. Please let him know that Dr Ellyn Hack feels it is OK for him to stay off aspirin, but continue Plavix (clopidogrel). Otherwise continue plan as discussed. Reyne Falconi PA-C

## 2019-10-29 NOTE — Telephone Encounter (Signed)
S/w pt is aware of Dr. Allison Quarry recommendation's, will d/c ASA.  Pt had question about another medication that was cut in half.  Pt is aware this is lisinopril, one tablet (2.5 mg) daily.

## 2019-11-06 ENCOUNTER — Other Ambulatory Visit: Payer: Self-pay

## 2019-11-06 ENCOUNTER — Encounter (HOSPITAL_COMMUNITY): Payer: Self-pay | Admitting: Occupational Therapy

## 2019-11-06 ENCOUNTER — Ambulatory Visit (HOSPITAL_COMMUNITY): Payer: Medicare HMO | Attending: Nurse Practitioner | Admitting: Occupational Therapy

## 2019-11-06 DIAGNOSIS — R41844 Frontal lobe and executive function deficit: Secondary | ICD-10-CM | POA: Insufficient documentation

## 2019-11-06 NOTE — Therapy (Signed)
[place], [place], [address] Phone: [phone]   Fax:  [phone]  Occupational Therapy Evaluation  Patient Details  Name: [name] MRN: [id number] Date of Birth: 1945-07-12 Referring Provider (OT): Jeralyn Ruths, NP   Encounter Date: 11/06/2019  OT End of Session - 11/06/19 1145    Visit Number  1    Number of Visits  1    Date for OT Re-Evaluation  11/07/19    Authorization Type  [employer]-$35 copay    Progress Note Due on Visit  10    OT Start Time  1032    OT Stop Time  1115    OT Time Calculation (min)  43 min    Activity Tolerance  Patient tolerated treatment well    Behavior During Therapy  [facility] for tasks assessed/performed       Past Medical History:  Diagnosis Date  . Acute MI, inferolateral wall, initial episode of care ([place]) 06/16/15   99% dCx   . BPH (benign prostatic hyperplasia) 04/08/2011  . CAD S/P percutaneous coronary angioplasty 11/23/12; 06/16/15   a. NSTEMI 2014 with 2 overlapping mRCA lesions - Xience Xpedition DES 2.75 mm x 18 mm (3.0 mm). b. Inf-Lat STEMI 06/16/2015 - dCx Asp Thrombectomy & PCI 3.0 x 15 Xience drDES (3.6 mm).  . Chronic combined systolic and diastolic CHF (congestive heart failure) ([place])   . Chronic headache   . CKD (chronic kidney disease), stage III   . CLL (chronic lymphocytic leukemia) ([place]) 04/08/2011  . Depression   . Diverticulitis   . Gallstone   . GERD (gastroesophageal reflux disease)   . Hypercholesteremia   . Ischemic cardiomyopathy    Echo 06/18/2015 EF 35-40% after lateral MI -> repeat echo 08/06/2015: EF 40-45% with basal-mid inferolateral akinesis and hypokinesis of the lateral wall.  . NSTEMI (non-ST elevated myocardial infarction) ([place]) 11/23/12   NSTEMI 11/23/2012 RCA lesion - PCI with DES; moderate LAD disease  . PTSD (post-traumatic stress disorder)    With Depression - since MI    Past Surgical History:  Procedure Laterality Date  .  APPENDECTOMY  1968-70  . CARDIAC CATHETERIZATION N/A 06/16/2015   Procedure: Left Heart Cath and Coronary Angiography;  Surgeon: Jettie Booze, MD;  Location: [place] [facility];  Service: Cardiovascular: 99% thrombotic dCx -> asp thrombectomy & PCI. RCA Stents Patent.   Marland Kitchen CARDIAC CATHETERIZATION N/A 06/16/2015   Procedure: Coronary Stent Intervention;  Surgeon: Jettie Booze, MD;  Location: [place] [facility];  Service: Cardiovascular: PCI dCx = thrombectomy -> 3.0 x 15 Xience drug-eluting stent (3.6 mm)   . COLONOSCOPY  2008   [id number] diverticula, diminutive polyp in the cecum, s/p bx Normal rectum. Tubular adenoma  . COLONOSCOPY N/A 10/17/2012   Dr. Gala Romney: colonic diverticulosis, tubular adenoma, surveillance due May 2019  . COLONOSCOPY N/A 02/21/2018   Procedure: COLONOSCOPY;  Surgeon: Daneil Dolin, MD;  [facility];  Service: Endoscopy;  Laterality: N/A;  12:00  . CORONARY STENT PLACEMENT  11/23/12   Mid RCA -- Xience eXp - 2.75 mm x 18 mm DES (3.12mm) , Dr. Ellyn Hack  . IR IVC FILTER PLMT / S&I /IMG GUID/MOD SED  05/27/2019  . IR RADIOLOGIST EVAL & MGMT  09/11/2019  . LEFT HEART CATHETERIZATION WITH CORONARY ANGIOGRAM N/A 11/23/2012   Procedure: LEFT HEART CATHETERIZATION WITH CORONARY ANGIOGRAM;  Surgeon: Leonie Man, MD;  Location: [facility] CATH LAB;  Service: Cardiovascular: NSTEMI:  Culprit = mid RCA 95% & 75%; LAD ~40-50%, Small RI ~60%; EF ~60%, mild inf-basal HK     . TRANSTHORACIC ECHOCARDIOGRAM  January 2017; March 2017   a. EF 35-40%. Entire inferior hypokinesis and akinesis of the basal and mid inferolateral and anterolateral /distal lateral walls;; b. EF 40 and 45%. Akinesis of basal-mid inferolateral wall. Hypokinesis of entire lateral wall.    There were no vitals filed for this visit.  Subjective Assessment - 11/06/19 1141    Subjective   S: I think I'm doing pretty good.    Pertinent History  Pt is a 74 y/o male presenting for driving assessment  specifically for cognition/memory loss. Pt was dx with covid on 05/09/19 and was hospitalized from 05/15/19-05/29/19, discharged to [facility], then received [facility] PT/OT. Pt and wife endorse cognitive changes including difficulty with memory. Pt has been anxious to drive and was referred to occupational therapy for evaluation by Jeralyn Ruths, NP.    Special Tests  Trail Making Test, SLUMS    Patient Stated Goals  To be able to return to driving    Currently in Pain?  No/denies        [facility] OT Assessment - 11/06/19 1031      Assessment   Medical Diagnosis  memory loss-driving assessment    Referring Provider (OT)  Jeralyn Ruths, NP    Onset Date/Surgical Date  05/08/20   last time he drove; onset of covid   Hand Dominance  Right    Prior Therapy  None      Precautions   Precautions  None      Restrictions   Weight Bearing Restrictions  No      Balance Screen   Has the patient fallen in the past 6 months  Yes    How many times?  2    Has the patient had a decrease in activity level because of a fear of falling?   No    Is the patient reluctant to leave their home because of a fear of falling?   No      Prior Function   Level of Independence  Independent with basic ADLs    Vocation  Retired    Leisure  exercising       ADL   ADL comments  pt is independent in ADLs      Written Expression   Dominant Hand  Right      Vision - History   Baseline Vision  Wears glasses only for reading      Cognition   Overall Cognitive Status  Within Functional Limits for tasks assessed    Memory  Impaired    Memory Impairment  Storage deficit;Retrieval deficit;Decreased recall of new information;Decreased short term memory    Decreased Short Term Memory  Verbal basic;Verbal complex    Cognition Comments  Trail Making Test (TMT) administered. Pt completing part A in 2'48". Pt required heavy cuing for part B and required 10+ minutes to complete therefore unable to be scored.  Also administered the  SLUMS examination-pt scoring a 5 which is indicative of a dementia diagnosis. Specfiic areas of difficulty were orientation, memory, calculation and registration, category naming, delayed recall, registration and digit span, clock drawing, visual spatial, and story recall with executive function                      OT Education - 11/06/19 1144    Education Details  educated pt on scoring  and outcomes of TMT and SLUMS testing, as well as correlation to driving. Recommended SLP evaluation for cognitive deficits noted during evaluation    Person(s) Educated  Patient;Spouse    Methods  Explanation    Comprehension  Verbalized understanding       OT Short Term Goals - 11/06/19 1151      OT SHORT TERM GOAL #1   Title  pt and wife will be educated on recommendations for safe driving.    Time  1    Period  Days    Status  Achieved    Target Date  11/06/19               Plan - 11/06/19 1146    Clinical Impression Statement  A: Pt is a 74 y/o male presenting for cogitive/memory assessment as related to driving. Pt has not driven since [id number] as he was dx with covid and hospitalized in early January. Pt and wife endorse memory loss, and confirm word finding difficulties as OT notes during evaluation. Pt competed TMT with signficant difficulty and heavy cuing required, standard scoring is completing of parts A & B in less than 3 minutes. Pt required 12+ minutes and heavy cuing for completion of both tests. Pt also scoring poorly on the SLUMS, as he scored a 5 compared to the normal range of 27-30. Discussed findings with pt and wife including recommendation that he refrain from driving at this time for safety reasons related to cognitive deficits. Recommend SLP evaluation and treatment of cognition, to which pt and wife are agreeable.    OT Occupational Profile and History  Problem Focused Assessment - Including review of records relating to presenting problem    Rehab  Potential  Good    Clinical Decision Making  Limited treatment options, no task modification necessary    Comorbidities Affecting Occupational Performance:  None    Modification or Assistance to Complete Evaluation   No modification of tasks or assist necessary to complete eval    OT Frequency  One time visit    OT Treatment/Interventions  Patient/family education    Plan  P: Educated pt and wife on recommendation that he not drive at this time due to cognitive deficits and safety concerns. Referral to SLP for cognition    Recommended Other Services  SLP evaluation & treatment    Consulted and Agree with Plan of Care  Patient;Family member/caregiver    Family Member Consulted  wife       Patient will benefit from skilled therapeutic intervention in order to improve the following deficits and impairments:           Visit Diagnosis: Frontal lobe and executive function deficit    Problem List Patient Active Problem List   Diagnosis Date Noted  . Iron deficiency anemia due to chronic blood loss 07/22/2019  . Normocytic anemia 07/16/2019  . Weakness 07/09/2019  . Memory loss 07/09/2019  . Rash 07/09/2019  . Gastrointestinal hemorrhage associated with intestinal diverticulosis   . Acute respiratory failure with hypoxia ([place]) 05/22/2019  . Sepsis ([place]) 05/15/2019  . Community acquired pneumonia 05/15/2019  . Pneumonia due to COVID-19 virus 05/15/2019  . COVID-19 05/15/2019  . Abdominal pain 03/06/2018  . Constipation 03/06/2018  . GERD (gastroesophageal reflux disease) 03/08/2016  . Esophageal dysphagia 03/08/2016  . Chronic cough 03/08/2016  . Ischemic cardiomyopathy   . STEMI - 06/16/15 06/16/2015  . Benign essential HTN 06/16/2015  . Myocardial infarction of inferolateral wall ([place]) 06/16/2015  .  Cholelithiasis without cholecystitis 12/18/2013  . Hematochezia 06/04/2013  . Lower leg edema 03/14/2013  . PTSD (post-traumatic stress disorder)   . CAD S/P PCI-- RCA DES 2014,   CFX DES Jan 2017 11/24/2012  . NSTEMI (non-ST elevated myocardial infarction) - history of 11/23/2012  . Hyperglycemia 11/23/2012  . History of smoking 11/23/2012  . Dyslipidemia, goal LDL below 70 - on simvastatin; monitored by PCP 11/23/2012  . Diverticulitis of colon without hemorrhage- on antibiotics for recent flair up 09/19/2012  . Hx of adenomatous colonic polyps 09/19/2012  . CLL (chronic lymphocytic leukemia) ([place]) 04/08/2011  . BPH (benign prostatic hyperplasia) 04/08/2011   Guadelupe Sabin, OTR/L  [phone] 11/06/2019, 12:51 PM  [place], [place], [address] Phone: [phone]   Fax:  [phone]  Name: [name] MRN: [id number] Date of Birth: [birth year]

## 2019-11-19 ENCOUNTER — Telehealth (HOSPITAL_COMMUNITY): Payer: Self-pay | Admitting: Speech Pathology

## 2019-11-19 NOTE — Telephone Encounter (Signed)
pt cancelled appt on 6/23 because he does not want to take ST

## 2019-11-27 ENCOUNTER — Ambulatory Visit (HOSPITAL_COMMUNITY): Payer: Medicare HMO | Admitting: Speech Pathology

## 2019-11-27 ENCOUNTER — Encounter (HOSPITAL_COMMUNITY): Payer: Self-pay

## 2019-11-29 ENCOUNTER — Telehealth (INDEPENDENT_AMBULATORY_CARE_PROVIDER_SITE_OTHER): Payer: Self-pay | Admitting: Nurse Practitioner

## 2019-11-29 ENCOUNTER — Other Ambulatory Visit: Payer: Self-pay

## 2019-11-29 ENCOUNTER — Telehealth (INDEPENDENT_AMBULATORY_CARE_PROVIDER_SITE_OTHER): Payer: Medicare HMO | Admitting: Nurse Practitioner

## 2019-11-29 ENCOUNTER — Encounter (INDEPENDENT_AMBULATORY_CARE_PROVIDER_SITE_OTHER): Payer: Self-pay | Admitting: Nurse Practitioner

## 2019-11-29 VITALS — BP 141/69 | HR 52

## 2019-11-29 DIAGNOSIS — Z0000 Encounter for general adult medical examination without abnormal findings: Secondary | ICD-10-CM | POA: Diagnosis not present

## 2019-11-29 NOTE — Progress Notes (Signed)
Due to national recommendations of social distancing related to the [place] pandemic, an audio/visual tele-health visit was felt to be the most appropriate encounter type for this patient today. I connected with  [name] on 11/29/19 utilizing audio-only technology and verified that I am speaking with the correct person using two identifiers. The patient was located at their home, and I was located at home during the encounter. I discussed the limitations of evaluation and management by telemedicine. The patient expressed understanding and agreed to proceed.  The patient did not have access to video technology at the time of the appointment, thus audio only was used.     Subjective:   [name] is a 74 y.o. male who presents for Medicare Annual/Subsequent preventive examination.  Review of Systems     Cardiac Risk Factors include: advanced age (>42men, >63 women);dyslipidemia;hypertension;male gender     Objective:    Today's Vitals   11/29/19 1051  BP: (!) 141/69  Pulse: (!) 52   There is no height or weight on file to calculate BMI.  Advanced Directives 11/29/2019 11/06/2019 10/23/2019 08/05/2019 08/02/2019 07/31/2019 07/29/2019  Does Patient Have a Medical Advance Directive? Yes No No No No No No  Does patient want to make changes to medical advance directive? No - Patient declined - - - - - -  Would patient like information on creating a medical advance directive? - No - Patient declined No - Patient declined No - Patient declined No - Patient declined No - Patient declined No - Patient declined  Pre-existing out of facility DNR order (yellow form or pink MOST form) - - - - - - -    Current Medications (verified) Outpatient Encounter Medications as of 11/29/2019  Medication Sig  . ascorbic acid (VITAMIN C) 500 MG tablet Take 1 tablet (500 mg total) by mouth daily.  . cholecalciferol (VITAMIN D) 1000 units tablet Take 1,000 Units by mouth once a week.  . clopidogrel (PLAVIX)  75 MG tablet Take 1 tablet (75 mg total) by mouth daily.  . furosemide (LASIX) 20 MG tablet Take 1 tablet (20 mg total) by mouth daily.  Marland Kitchen lisinopril (ZESTRIL) 2.5 MG tablet Take 1 tablet (2.5 mg total) by mouth daily.  . pantoprazole (PROTONIX) 40 MG tablet TAKE 1 TABLET BY MOUTH 30 MINUTES BEFORE A MEAL ONCE OR TWICE DAILY  . simvastatin (ZOCOR) 40 MG tablet Take 1 tablet (40 mg total) by mouth daily at 6 PM.  . tamsulosin (FLOMAX) 0.4 MG CAPS capsule TAKE 1 CAPSULE (0.4 MG TOTAL) BY MOUTH DAILY AFTER BREAKFAST.  Marland Kitchen vitamin B-12 (CYANOCOBALAMIN) 1000 MCG tablet Take 1 tablet (1,000 mcg total) by mouth daily.  . [DISCONTINUED] lisinopril (PRINIVIL,ZESTRIL) 2.5 MG tablet Take 1 tablet (2.5 mg total) by mouth daily.  . [DISCONTINUED] nitroGLYCERIN (NITROSTAT) 0.4 MG SL tablet Place 1 tablet (0.4 mg total) under the tongue every 5 (five) minutes as needed for chest pain. (Patient not taking: Reported on 05/14/2018)  . [DISCONTINUED] simvastatin (ZOCOR) 40 MG tablet Take 1 tablet (40 mg total) by mouth every other day. NEED OV.   No facility-administered encounter medications on file as of 11/29/2019.    Allergies (verified) Patient has no known allergies.   History: Past Medical History:  Diagnosis Date  . Acute MI, inferolateral wall, initial episode of care ([facility]) 06/16/15   99% dCx   . BPH (benign prostatic hyperplasia) 04/08/2011  . CAD S/P percutaneous coronary angioplasty 11/23/12; 06/16/15   a. NSTEMI 2014  with 2 overlapping mRCA lesions - Xience Xpedition DES 2.75 mm x 18 mm (3.0 mm). b. Inf-Lat STEMI 06/16/2015 - dCx Asp Thrombectomy & PCI 3.0 x 15 Xience drDES (3.6 mm).  . Chronic combined systolic and diastolic CHF (congestive heart failure) ([facility])   . Chronic headache   . CKD (chronic kidney disease), stage III   . CLL (chronic lymphocytic leukemia) ([facility]) 04/08/2011  . Depression   . Diverticulitis   . Gallstone   . GERD (gastroesophageal reflux disease)   . Hypercholesteremia   .  Ischemic cardiomyopathy    Echo 06/18/2015 EF 35-40% after lateral MI -> repeat echo 08/06/2015: EF 40-45% with basal-mid inferolateral akinesis and hypokinesis of the lateral wall.  . NSTEMI (non-ST elevated myocardial infarction) ([facility]) 11/23/12   NSTEMI 11/23/2012 RCA lesion - PCI with DES; moderate LAD disease  . PTSD (post-traumatic stress disorder)    With Depression - since MI   Past Surgical History:  Procedure Laterality Date  . APPENDECTOMY  1968-70  . CARDIAC CATHETERIZATION N/A 06/16/2015   Procedure: Left Heart Cath and Coronary Angiography;  Surgeon: Jettie Booze, MD;  Location: [facility];  Service: Cardiovascular: 99% thrombotic dCx -> asp thrombectomy & PCI. RCA Stents Patent.   Marland Kitchen CARDIAC CATHETERIZATION N/A 06/16/2015   Procedure: Coronary Stent Intervention;  Surgeon: Jettie Booze, MD;  Location: [facility];  Service: Cardiovascular: PCI dCx = thrombectomy -> 3.0 x 15 Xience drug-eluting stent (3.6 mm)   . COLONOSCOPY  2008   [id number] diverticula, diminutive polyp in the cecum, s/p bx Normal rectum. Tubular adenoma  . COLONOSCOPY N/A 10/17/2012   Dr. Gala Romney: colonic diverticulosis, tubular adenoma, surveillance due May 2019  . COLONOSCOPY N/A 02/21/2018   Procedure: COLONOSCOPY;  Surgeon: Daneil Dolin, MD;  [facility];  Service: Endoscopy;  Laterality: N/A;  12:00  . CORONARY STENT PLACEMENT  11/23/12   Mid RCA -- Xience eXp - 2.75 mm x 18 mm DES (3.11mm) , Dr. Ellyn Hack  . IR IVC FILTER PLMT / S&I /IMG GUID/MOD SED  05/27/2019  . IR RADIOLOGIST EVAL & MGMT  09/11/2019  . LEFT HEART CATHETERIZATION WITH CORONARY ANGIOGRAM N/A 11/23/2012   Procedure: LEFT HEART CATHETERIZATION WITH CORONARY ANGIOGRAM;  Surgeon: Leonie Man, MD;  Location: [facility] CATH LAB;  Service: Cardiovascular: NSTEMI: Culprit = mid RCA 95% & 75%; LAD ~40-50%, Small RI ~60%; EF ~60%, mild inf-basal HK     . TRANSTHORACIC ECHOCARDIOGRAM  January 2017; March 2017   a.  EF 35-40%. Entire inferior hypokinesis and akinesis of the basal and mid inferolateral and anterolateral /distal lateral walls;; b. EF 40 and 45%. Akinesis of basal-mid inferolateral wall. Hypokinesis of entire lateral wall.   Family History  Problem Relation Age of Onset  . Diabetes Mother   . Cancer Father        prostate  . Colon cancer Neg Hx    Social History   Socioeconomic History  . Marital status: Married    Spouse name: Not on file  . Number of children: Not on file  . Years of education: Not on file  . Highest education level: Not on file  Occupational History  . Not on file  Tobacco Use  . Smoking status: Former Smoker    Packs/day: 0.25    Years: 2.00    Pack years: 0.50    Types: Cigarettes    Quit date: 06/07/1963    Years since quitting: 56.5  . Smokeless  tobacco: Never Used  Vaping Use  . Vaping Use: Never used  Substance and Sexual Activity  . Alcohol use: No    Alcohol/week: 0.0 standard drinks    Comment: Occasionally drinks red wine  . Drug use: No  . Sexual activity: Not on file  Other Topics Concern  . Not on file  Social History Narrative   Exercises 3 to 4 days a week at the fitness center for about a half an hour at a time.   "former smoker". Does not drink EtOH   Married.   Social Determinants of Health   Financial Resource Strain:   . Difficulty of Paying Living Expenses:   Food Insecurity:   . Worried About Charity fundraiser in the Last Year:   . Arboriculturist in the Last Year:   Transportation Needs:   . Film/video editor (Medical):   Marland Kitchen Lack of Transportation (Non-Medical):   Physical Activity:   . Days of Exercise per Week:   . Minutes of Exercise per Session:   Stress:   . Feeling of Stress :   Social Connections:   . Frequency of Communication with Friends and Family:   . Frequency of Social Gatherings with Friends and Family:   . Attends Religious Services:   . Active Member of Clubs or Organizations:   . Attends  Archivist Meetings:   Marland Kitchen Marital Status:     Tobacco Counseling Counseling given: Not Answered   Clinical Intake:  Pre-visit preparation completed: Yes  Pain : No/denies pain     BMI - recorded: 21.9 Nutritional Status: BMI of 19-24  Normal Nutritional Risks: None Diabetes: No  How often do you need to have someone help you when you read instructions, pamphlets, or other written materials from your doctor or pharmacy?: 1 - Never What is the last grade level you completed in school?: 12th Grade  Diabetic? No  Interpreter Needed?: No  Information entered by :: Jeralyn Ruths, NP-C   Activities of Daily Living In your present state of health, do you have any difficulty performing the following activities: 11/29/2019 05/20/2019  Hearing? Y N  Vision? N N  Difficulty concentrating or making decisions? Tempie Donning  Walking or climbing stairs? - Y  Dressing or bathing? N Y  Doing errands, shopping? Tempie Donning  Preparing Food and eating ? Y -  Using the Toilet? N -  In the past six months, have you accidently leaked urine? N -  Do you have problems with loss of bowel control? N -  Managing your Medications? N -  Managing your Finances? Y -  Housekeeping or managing your Housekeeping? N -  Some recent data might be hidden    Patient Care Team: [name], MD as PCP - General (Internal Medicine) Leonie Man, MD as PCP - Cardiology (Cardiology) Gala Romney Cristopher Estimable, MD as Attending Physician (Gastroenterology) Derek Jack, MD as Consulting Physician (Hematology and Oncology)  Indicate any recent Medical Services you may have received from other than [facility] providers in the past year (date may be approximate).     Assessment:   This is a routine wellness examination for Naser.  Hearing/Vision screen No exam data present  Dietary issues and exercise activities discussed: Current Exercise Habits: Home exercise routine, Type of exercise: strength  training/weights;walking, Time (Minutes): 20, Frequency (Times/Week): 3, Weekly Exercise (Minutes/Week): 60, Intensity: Mild, Exercise limited by: cardiac condition(s)  Goals    . Prevent falls  Depression Screen PHQ 2/9 Scores 11/29/2019 08/19/2019 03/21/2014 12/14/2012  PHQ - 2 Score 0 0 0 0  Exception Documentation - Medical reason - -    Fall Risk Fall Risk  11/29/2019 08/19/2019 03/21/2014 12/14/2012  Falls in the past year? 1 0 No No  Number falls in past yr: 1 0 - -  Injury with Fall? 0 0 - -  Risk for fall due to : - No Fall Risks - -  Follow up Education provided;Falls prevention discussed;Falls evaluation completed Falls evaluation completed - -    Any stairs in or around the home? No  If so, are there any without handrails? No  Home free of loose throw rugs in walkways, pet beds, electrical cords, etc? Yes  Adequate lighting in your home to reduce risk of falls? Yes   ASSISTIVE DEVICES UTILIZED TO PREVENT FALLS:  Life alert? No  Use of a cane, walker or w/c? Yes  Grab bars in the bathroom? No  Shower chair or bench in shower? Yes  Elevated toilet seat or a handicapped toilet? No   TIMED UP AND GO:  Was the test performed? No .  Length of time to ambulate 10 feet: N/A   Cognitive Function:     6CIT Screen 11/29/2019  What Year? 0 points  What month? 0 points  What time? 0 points  Count back from 20 0 points  Months in reverse 0 points  Repeat phrase 0 points  Total Score 0    Immunizations Immunization History  Administered Date(s) Administered  . Fluad Quad(high Dose 65+) 02/22/2019  . Influenza, High Dose Seasonal PF 01/30/2018  . Influenza,inj,Quad PF,6+ Mos 03/21/2014  . Influenza-Unspecified 02/22/2019  . Moderna SARS-COVID-2 Vaccination 08/19/2019, 09/16/2019  . Pneumococcal Conjugate-13 03/19/2018    TDAP status: Due, Education has been provided regarding the importance of this vaccine. Advised may receive this vaccine at local pharmacy or  Health Dept. Aware to provide a copy of the vaccination record if obtained from local pharmacy or Health Dept. Verbalized acceptance and understanding. Flu Vaccine status: Up to date PPSV23 - due; will offer at next OV Covid-19 vaccine status: Completed vaccines  Qualifies for Shingles Vaccine? Yes   Zostavax completed No   Shingrix Completed?: No.    Education has been provided regarding the importance of this vaccine. Patient has been advised to call insurance company to determine out of pocket expense if they have not yet received this vaccine. Advised may also receive vaccine at local pharmacy or Health Dept. Verbalized acceptance and understanding.  Screening Tests Health Maintenance  Topic Date Due  . Hepatitis C Screening  Never done  . TETANUS/TDAP  Never done  . PNA vac Low Risk Adult (2 of 2 - PPSV23) 03/20/2019  . INFLUENZA VACCINE  01/05/2020  . COLONOSCOPY  02/22/2028  . COVID-19 Vaccine  Completed    Health Maintenance  Health Maintenance Due  Topic Date Due  . Hepatitis C Screening  Never done  . TETANUS/TDAP  Never done  . PNA vac Low Risk Adult (2 of 2 - PPSV23) 03/20/2019    Colorectal cancer screening: Completed 2019. Repeat every 0 years - aged out of recommendation  Lung Cancer Screening: (Low Dose CT Chest recommended if Age 75-80 years, 30 pack-year currently smoking OR have quit w/in 15years.) does not qualify.   Lung Cancer Screening Referral: N/A  Additional Screening:  Hepatitis C Screening: does qualify; will screen at next office visit  Vision Screening: Recommended annual ophthalmology exams  for early detection of glaucoma and other disorders of the eye. Is the patient up to date with their annual eye exam?  No  Who is the provider or what is the name of the office in which the patient attends annual eye exams? [facility] If pt is not established with a provider, would they like to be referred to a provider to establish care? No .   Dental  Screening: Recommended annual dental exams for proper oral hygiene  Community Resource Referral / Chronic Care Management: CRR required this visit?  No   CCM required this visit?  No      Plan:    He is due for both pneumonia and tetanus vaccine as well as hepatitis C screening.  We will try to get these completed at his next office visit.  I have asked him to bring his advance directives to the office to be have a copy in place in his chart.  I have personally reviewed and noted the following in the patient's chart:   . Medical and social history . Use of alcohol, tobacco or illicit drugs  . Current medications and supplements . Functional ability and status . Nutritional status . Physical activity . Advanced directives . List of other physicians . Hospitalizations, surgeries, and ER visits in previous 12 months . Vitals . Screenings to include cognitive, depression, and falls . Referrals and appointments  In addition, I have reviewed and discussed with patient certain preventive protocols, quality metrics, and best practice recommendations. A written personalized care plan for preventive services as well as general preventive health recommendations were provided to patient.    He will follow-up in 2 months for routine office visit and then again next year for his annual Medicare wellness visit.  I spent 34 minutes on the phone with this patient during the visit. Ailene Ards, NP   11/29/2019

## 2019-11-29 NOTE — Telephone Encounter (Signed)
Please print out the after visit summary for this patient from appointment on 11/29/2019 and mailed to his home.  Thank you.

## 2019-11-29 NOTE — Patient Instructions (Signed)
  [name] , Thank you for taking time to come for your Medicare Wellness Visit. I appreciate your ongoing commitment to your health goals. Please review the following plan we discussed and let me know if I can assist you in the future.   These are the goals we discussed: Goals    . Prevent falls       This is a list of the screening recommended for you and due dates:  Health Maintenance  Topic Date Due  .  Hepatitis C: One time screening is recommended by Center for Disease Control  (CDC) for  adults born from [birth year] through 1967.   Never done  . Tetanus Vaccine  Never done  . Pneumonia vaccines (2 of 2 - PPSV23) 03/20/2019  . Flu Shot  01/05/2020  . Colon Cancer Screening  02/22/2028  . COVID-19 Vaccine  Completed

## 2019-12-02 NOTE — Telephone Encounter (Signed)
Done

## 2019-12-05 ENCOUNTER — Telehealth (INDEPENDENT_AMBULATORY_CARE_PROVIDER_SITE_OTHER): Payer: Self-pay | Admitting: Nurse Practitioner

## 2019-12-05 NOTE — Telephone Encounter (Signed)
Please call this patient and let him know that I did contact Guadelupe Sabin the therapist that screen him for safety in regards to driving.  Based on her assessment she recommends that he NOT drive at this time.  If they would like a more in-depth assessment to determine whether or not he should drive she did recommend that I refer him to an occupational therapist named Helene Shoe in [place], [place].  Magda Paganini did tell me that Ms. Compton is cash pay, but that she might be able to provide further recommendations.  So if they would like this referral I can send it, otherwise I would recommend that he not drive.

## 2019-12-05 NOTE — Telephone Encounter (Signed)
I spoke to [place] and he understands but right now he states he does not have any transportation to [place] so he will just not drive at this time

## 2020-01-17 ENCOUNTER — Other Ambulatory Visit (INDEPENDENT_AMBULATORY_CARE_PROVIDER_SITE_OTHER): Payer: Self-pay | Admitting: Internal Medicine

## 2020-01-17 DIAGNOSIS — I214 Non-ST elevation (NSTEMI) myocardial infarction: Secondary | ICD-10-CM

## 2020-02-04 ENCOUNTER — Ambulatory Visit (INDEPENDENT_AMBULATORY_CARE_PROVIDER_SITE_OTHER): Payer: Medicare HMO | Admitting: Nurse Practitioner

## 2020-02-18 ENCOUNTER — Other Ambulatory Visit: Payer: Self-pay

## 2020-02-18 ENCOUNTER — Encounter (INDEPENDENT_AMBULATORY_CARE_PROVIDER_SITE_OTHER): Payer: Self-pay | Admitting: Nurse Practitioner

## 2020-02-18 ENCOUNTER — Ambulatory Visit (INDEPENDENT_AMBULATORY_CARE_PROVIDER_SITE_OTHER): Payer: Medicare HMO | Admitting: Nurse Practitioner

## 2020-02-18 VITALS — BP 110/60 | HR 75 | Temp 97.3°F | Resp 18 | Ht 69.0 in | Wt 155.8 lb

## 2020-02-18 DIAGNOSIS — E559 Vitamin D deficiency, unspecified: Secondary | ICD-10-CM

## 2020-02-18 DIAGNOSIS — R413 Other amnesia: Secondary | ICD-10-CM | POA: Diagnosis not present

## 2020-02-18 DIAGNOSIS — Z1159 Encounter for screening for other viral diseases: Secondary | ICD-10-CM | POA: Diagnosis not present

## 2020-02-18 DIAGNOSIS — R600 Localized edema: Secondary | ICD-10-CM

## 2020-02-18 DIAGNOSIS — I10 Essential (primary) hypertension: Secondary | ICD-10-CM

## 2020-02-18 DIAGNOSIS — E785 Hyperlipidemia, unspecified: Secondary | ICD-10-CM

## 2020-02-18 DIAGNOSIS — Z9189 Other specified personal risk factors, not elsewhere classified: Secondary | ICD-10-CM

## 2020-02-18 NOTE — Progress Notes (Signed)
Subjective:  Patient ID: [name], male    DOB: 09/26/1945  Age: 74 y.o. MRN: [id number]  CC:  Chief Complaint  Patient presents with   Follow-up   Hypertension   Hyperlipidemia   Other    Memory loss, vitamin D deficiency      HPI  This patient arrives today for the above.  Hypertension: He continues on lisinopril and as needed furosemide.  He is not potassium supplement.  Last metabolic panel showed normal potassium level.  He is tolerating this medication well.  Hyperlipidemia: Continues on simvastatin 40 mg daily.  Last LDL level was 77 this was collected about 4 months ago.  Memory loss: He has been experiencing some intermittent memory loss and recently did Occupational Therapy driving test.  It was recommended that he avoid driving at this time.  This is frustrating for him as he feels that he has lost some of his independence.  He tells me he is now relying on family transport or public transportation to get anywhere and would like to drive again.  Vitamin D deficiency: He continues on 1000 IUs of vitamin D3 weekly.  4 months ago serum level was 33.  He is due for hepatitis C screening and Pneumovax 23 administration.  Tells me he had the flu shot administered approximately 3 weeks ago.  Past Medical History:  Diagnosis Date   Acute MI, inferolateral wall, initial episode of care ([facility]) 06/16/15   99% dCx    BPH (benign prostatic hyperplasia) 04/08/2011   CAD S/P percutaneous coronary angioplasty 11/23/12; 06/16/15   a. NSTEMI 2014 with 2 overlapping mRCA lesions - Xience Xpedition DES 2.75 mm x 18 mm (3.0 mm). b. Inf-Lat STEMI 06/16/2015 - dCx Asp Thrombectomy & PCI 3.0 x 15 Xience drDES (3.6 mm).   Chronic combined systolic and diastolic CHF (congestive heart failure) ([facility])    Chronic headache    CKD (chronic kidney disease), stage III    CLL (chronic lymphocytic leukemia) ([place]) 04/08/2011   Depression    Diverticulitis    Gallstone    GERD  (gastroesophageal reflux disease)    Hypercholesteremia    Ischemic cardiomyopathy    Echo 06/18/2015 EF 35-40% after lateral MI -> repeat echo 08/06/2015: EF 40-45% with basal-mid inferolateral akinesis and hypokinesis of the lateral wall.   NSTEMI (non-ST elevated myocardial infarction) ([facility]) 11/23/12   NSTEMI 11/23/2012 RCA lesion - PCI with DES; moderate LAD disease   PTSD (post-traumatic stress disorder)    With Depression - since MI      Family History  Problem Relation Age of Onset   Diabetes Mother    Cancer Father        prostate   Colon cancer Neg Hx     Social History   Social History Narrative   Exercises 3 to 4 days a week at the fitness center for about a half an hour at a time.   "former smoker". Does not drink EtOH   Married.   Social History   Tobacco Use   Smoking status: Former Smoker    Packs/day: 0.25    Years: 2.00    Pack years: 0.50    Types: Cigarettes    Quit date: 06/07/1963    Years since quitting: 56.7   Smokeless tobacco: Never Used  Substance Use Topics   Alcohol use: No    Alcohol/week: 0.0 standard drinks    Comment: Occasionally drinks red wine     No outpatient medications  have been marked as taking for the 02/18/20 encounter (Office Visit) with Ailene Ards, NP.    ROS:  Review of Systems  Constitutional: Negative for malaise/fatigue.  Eyes: Negative for blurred vision.  Respiratory: Negative for cough and shortness of breath.   Cardiovascular: Negative for chest pain.  Neurological: Negative for dizziness, loss of consciousness and headaches.  Psychiatric/Behavioral: Positive for memory loss.     Objective:   Today's Vitals: BP 110/60 (BP Location: Right Arm, Patient Position: Sitting, Cuff Size: Normal)    Pulse 75    Temp (!) 97.3 F (36.3 C) (Temporal)    Resp 18    Ht 5' 9"  (1.753 m)    Wt 155 lb 12.8 oz (70.7 kg)    SpO2 96%    BMI 23.01 kg/m  Vitals with BMI 02/18/2020 11/29/2019 10/25/2019  Height 5' 9"  -  5' 9"   Weight 155 lbs 13 oz - 149 lbs  BMI 23 - 46.96  Systolic 295 284 132  Diastolic 60 69 56  Pulse 75 52 55     Physical Exam Vitals reviewed.  Constitutional:      Appearance: Normal appearance.  HENT:     Head: Normocephalic and atraumatic.  Cardiovascular:     Rate and Rhythm: Normal rate and regular rhythm.  Pulmonary:     Effort: Pulmonary effort is normal.     Breath sounds: Normal breath sounds.  Musculoskeletal:     Cervical back: Neck supple.  Skin:    General: Skin is warm and dry.  Neurological:     Mental Status: He is alert and oriented to person, place, and time.  Psychiatric:        Mood and Affect: Mood normal.        Behavior: Behavior normal.        Thought Content: Thought content normal.        Judgment: Judgment normal.          Assessment and Plan   1. Lower leg edema   2. Encounter for hepatitis C screening test for low risk patient   3. Memory loss   4. Benign essential HTN   5. Hyperlipidemia, unspecified hyperlipidemia type   6. Vitamin D deficiency   7. 23-polyvalent pneumococcal polysaccharide vaccine indication of age greater than 71 years old      Plan: 1.  We will check CMP level to monitor potassium.  He will continue taking furosemide as needed. 2.  We will screen for hepatitis C as he is due for this today. 3.  We did discuss evaluation with neurology, but he would prefer to hold off on this for now.  I also offered to refer to [facility] care management to see if there are any community resources that he can utilize that he is not aware of.  He is agreeable to this and I will send referral today. 4.  He will continue on his antihypertensives as currently prescribed for his hypertension. 5.  He will continue on his current dose of simvastatin as prescribed 6.  He will continue on his vitamin D3 supplement. 7.  We will administer Pneumovax 23 today.  He was told about signs and symptoms of allergic reaction and to call 911 if  these occur.  Tests ordered Orders Placed This Encounter  Procedures   CMP with eGFR([facility])   Hep C Antibody   AMB Referral to [facility] Management      No orders of the defined types were  placed in this encounter.   Patient to follow-up in 3 months or sooner as needed.  Ailene Ards, NP

## 2020-02-19 ENCOUNTER — Encounter (INDEPENDENT_AMBULATORY_CARE_PROVIDER_SITE_OTHER): Payer: Self-pay | Admitting: Nurse Practitioner

## 2020-02-19 LAB — COMPLETE METABOLIC PANEL WITH GFR
AG Ratio: 1.9 (calc) (ref 1.0–2.5)
ALT: 11 U/L (ref 9–46)
AST: 19 U/L (ref 10–35)
Albumin: 4.2 g/dL (ref 3.6–5.1)
Alkaline phosphatase (APISO): 46 U/L (ref 35–144)
BUN/Creatinine Ratio: 13 (calc) (ref 6–22)
BUN: 19 mg/dL (ref 7–25)
CO2: 26 mmol/L (ref 20–32)
Calcium: 9.6 mg/dL (ref 8.6–10.3)
Chloride: 106 mmol/L (ref 98–110)
Creat: 1.51 mg/dL — ABNORMAL HIGH (ref 0.70–1.18)
GFR, Est African American: 52 mL/min/{1.73_m2} — ABNORMAL LOW (ref >=60)
GFR, Est Non African American: 45 mL/min/{1.73_m2} — ABNORMAL LOW (ref >=60)
Globulin: 2.2 g/dL (calc) (ref 1.9–3.7)
Glucose, Bld: 93 mg/dL (ref 65–99)
Potassium: 4.3 mmol/L (ref 3.5–5.3)
Sodium: 142 mmol/L (ref 135–146)
Total Bilirubin: 0.8 mg/dL (ref 0.2–1.2)
Total Protein: 6.4 g/dL (ref 6.1–8.1)

## 2020-02-19 LAB — HEPATITIS C ANTIBODY
Hepatitis C Ab: NONREACTIVE
SIGNAL TO CUT-OFF: 0.02 (ref <1.00)

## 2020-02-20 ENCOUNTER — Other Ambulatory Visit (INDEPENDENT_AMBULATORY_CARE_PROVIDER_SITE_OTHER): Payer: Self-pay | Admitting: Internal Medicine

## 2020-02-20 DIAGNOSIS — R413 Other amnesia: Secondary | ICD-10-CM

## 2020-02-20 DIAGNOSIS — N401 Enlarged prostate with lower urinary tract symptoms: Secondary | ICD-10-CM

## 2020-02-20 MED ORDER — VITAMIN B-12 1000 MCG PO TABS: 1000.0000 ug | ORAL_TABLET | Freq: Every day | ORAL | 0 refills | Status: DC

## 2020-02-20 MED ORDER — SIMVASTATIN 40 MG PO TABS: 40.0000 mg | ORAL_TABLET | Freq: Every day | ORAL | 0 refills | Status: DC

## 2020-04-14 ENCOUNTER — Other Ambulatory Visit (INDEPENDENT_AMBULATORY_CARE_PROVIDER_SITE_OTHER): Payer: Self-pay | Admitting: Nurse Practitioner

## 2020-04-14 ENCOUNTER — Inpatient Hospital Stay (HOSPITAL_COMMUNITY): Payer: Medicare HMO | Attending: Hematology

## 2020-04-14 ENCOUNTER — Other Ambulatory Visit: Payer: Self-pay

## 2020-04-14 DIAGNOSIS — K219 Gastro-esophageal reflux disease without esophagitis: Secondary | ICD-10-CM | POA: Diagnosis not present

## 2020-04-14 DIAGNOSIS — Z79899 Other long term (current) drug therapy: Secondary | ICD-10-CM | POA: Diagnosis not present

## 2020-04-14 DIAGNOSIS — Z833 Family history of diabetes mellitus: Secondary | ICD-10-CM | POA: Insufficient documentation

## 2020-04-14 DIAGNOSIS — I252 Old myocardial infarction: Secondary | ICD-10-CM | POA: Insufficient documentation

## 2020-04-14 DIAGNOSIS — D649 Anemia, unspecified: Secondary | ICD-10-CM

## 2020-04-14 DIAGNOSIS — Z87891 Personal history of nicotine dependence: Secondary | ICD-10-CM | POA: Insufficient documentation

## 2020-04-14 DIAGNOSIS — D631 Anemia in chronic kidney disease: Secondary | ICD-10-CM | POA: Insufficient documentation

## 2020-04-14 DIAGNOSIS — Z8042 Family history of malignant neoplasm of prostate: Secondary | ICD-10-CM | POA: Insufficient documentation

## 2020-04-14 DIAGNOSIS — Z9049 Acquired absence of other specified parts of digestive tract: Secondary | ICD-10-CM | POA: Insufficient documentation

## 2020-04-14 DIAGNOSIS — I2510 Atherosclerotic heart disease of native coronary artery without angina pectoris: Secondary | ICD-10-CM | POA: Diagnosis not present

## 2020-04-14 DIAGNOSIS — E7800 Pure hypercholesterolemia, unspecified: Secondary | ICD-10-CM | POA: Insufficient documentation

## 2020-04-14 DIAGNOSIS — N183 Chronic kidney disease, stage 3 unspecified: Secondary | ICD-10-CM | POA: Insufficient documentation

## 2020-04-14 DIAGNOSIS — C9110 Chronic lymphocytic leukemia of B-cell type not having achieved remission: Secondary | ICD-10-CM | POA: Insufficient documentation

## 2020-04-14 DIAGNOSIS — N400 Enlarged prostate without lower urinary tract symptoms: Secondary | ICD-10-CM | POA: Insufficient documentation

## 2020-04-14 DIAGNOSIS — R413 Other amnesia: Secondary | ICD-10-CM

## 2020-04-14 LAB — COMPREHENSIVE METABOLIC PANEL
ALT: 12 U/L (ref 0–44)
AST: 20 U/L (ref 15–41)
Albumin: 4.1 g/dL (ref 3.5–5.0)
Alkaline Phosphatase: 41 U/L (ref 38–126)
Anion gap: 8 (ref 5–15)
BUN: 24 mg/dL — ABNORMAL HIGH (ref 8–23)
CO2: 25 mmol/L (ref 22–32)
Calcium: 9.2 mg/dL (ref 8.9–10.3)
Chloride: 108 mmol/L (ref 98–111)
Creatinine, Ser: 1.52 mg/dL — ABNORMAL HIGH (ref 0.61–1.24)
GFR, Estimated: 47 mL/min — ABNORMAL LOW (ref >60)
Glucose, Bld: 112 mg/dL — ABNORMAL HIGH (ref 70–99)
Potassium: 3.9 mmol/L (ref 3.5–5.1)
Sodium: 141 mmol/L (ref 135–145)
Total Bilirubin: 1.2 mg/dL (ref 0.3–1.2)
Total Protein: 6.8 g/dL (ref 6.5–8.1)

## 2020-04-14 LAB — CBC WITH DIFFERENTIAL/PLATELET
Abs Immature Granulocytes: 0.05 10*3/uL (ref 0.00–0.07)
Basophils Absolute: 0.1 10*3/uL (ref 0.0–0.1)
Basophils Relative: 1 %
Eosinophils Absolute: 0.1 10*3/uL (ref 0.0–0.5)
Eosinophils Relative: 1 %
HCT: 44.7 % (ref 39.0–52.0)
Hemoglobin: 13.5 g/dL (ref 13.0–17.0)
Immature Granulocytes: 0 %
Lymphocytes Relative: 40 %
Lymphs Abs: 4.6 10*3/uL — ABNORMAL HIGH (ref 0.7–4.0)
MCH: 29 pg (ref 26.0–34.0)
MCHC: 30.2 g/dL (ref 30.0–36.0)
MCV: 96.1 fL (ref 80.0–100.0)
Monocytes Absolute: 0.7 10*3/uL (ref 0.1–1.0)
Monocytes Relative: 6 %
Neutro Abs: 6.1 10*3/uL (ref 1.7–7.7)
Neutrophils Relative %: 52 %
Platelets: 186 10*3/uL (ref 150–400)
RBC: 4.65 MIL/uL (ref 4.22–5.81)
RDW: 13.9 % (ref 11.5–15.5)
WBC: 11.7 10*3/uL — ABNORMAL HIGH (ref 4.0–10.5)
nRBC: 0 % (ref 0.0–0.2)

## 2020-04-14 LAB — IRON AND TIBC
Iron: 86 ug/dL (ref 45–182)
Saturation Ratios: 28 % (ref 17.9–39.5)
TIBC: 309 ug/dL (ref 250–450)
UIBC: 223 ug/dL

## 2020-04-14 LAB — FERRITIN: Ferritin: 199 ng/mL (ref 24–336)

## 2020-04-21 ENCOUNTER — Other Ambulatory Visit: Payer: Self-pay

## 2020-04-21 ENCOUNTER — Inpatient Hospital Stay (HOSPITAL_COMMUNITY): Payer: Medicare HMO | Admitting: Hematology

## 2020-04-21 ENCOUNTER — Other Ambulatory Visit (HOSPITAL_COMMUNITY): Payer: Self-pay | Admitting: Surgery

## 2020-04-21 VITALS — BP 123/54 | HR 61 | Temp 97.6°F | Resp 17 | Wt 162.8 lb

## 2020-04-21 DIAGNOSIS — D649 Anemia, unspecified: Secondary | ICD-10-CM

## 2020-04-21 DIAGNOSIS — C9110 Chronic lymphocytic leukemia of B-cell type not having achieved remission: Secondary | ICD-10-CM | POA: Diagnosis not present

## 2020-04-21 NOTE — Patient Instructions (Signed)
[place] at [facility] Discharge Instructions  You were seen today by Dr. Delton Coombes. He went over your recent results. Your next appointment will be with the nurse practitioner in 4 months for labs and follow up.   Thank you for choosing [place] at [facility] to provide your oncology and hematology care.  To afford each patient quality time with our provider, please arrive at least 15 minutes before your scheduled appointment time.   If you have a lab appointment with the [facility] [place] please come in thru the Main Entrance and check in at the main information desk  You need to re-schedule your appointment should you arrive 10 or more minutes late.  We strive to give you quality time with our providers, and arriving late affects you and other patients whose appointments are after yours.  Also, if you no show three or more times for appointments you may be dismissed from the clinic at the providers discretion.     Again, thank you for choosing [facility].  Our hope is that these requests will decrease the amount of time that you wait before being seen by our physicians.       _____________________________________________________________  Should you have questions after your visit to [facility], please contact our office at [phone] between the hours of 8:00 a.m. and 4:30 p.m.  Voicemails left after 4:00 p.m. will not be returned until the following business day.  For prescription refill requests, have your pharmacy contact our office and allow 72 hours.    Cancer Center Support Programs:   > Cancer Support Group  2nd Tuesday of the month 1pm-2pm, Journey Room

## 2020-04-21 NOTE — Progress Notes (Signed)
[address] [place], [place] [address]   CLINIC:  Medical Oncology/Hematology  PCP:  [name], MD [place] / [place] [address]  [phone]  REASON FOR VISIT:  Follow-up for CLL and anemia  PRIOR THERAPY: None  CURRENT THERAPY: Observation and intermittent Venofer  INTERVAL HISTORY:  [name], a 74 y.o. male, returns for routine follow-up for his CLL and anemia. [name] was last seen on 10/23/2019.  Today he is accompanied by his wife and he reports feeling well. He denies having recent infections, F/C, night sweats, or painful adenopathy. He denies having any SOB or dyspnea.  He stays mainly sedentary at home and tries to do exercises while sitting.   REVIEW OF SYSTEMS:  Review of Systems  Constitutional: Negative for chills, diaphoresis and fever.  Respiratory: Negative for shortness of breath.   Hematological: Negative for adenopathy.    PAST MEDICAL/SURGICAL HISTORY:  Past Medical History:  Diagnosis Date  . Acute MI, inferolateral wall, initial episode of care ([place]) 06/16/15   99% dCx   . BPH (benign prostatic hyperplasia) 04/08/2011  . CAD S/P percutaneous coronary angioplasty 11/23/12; 06/16/15   a. NSTEMI 2014 with 2 overlapping mRCA lesions - Xience Xpedition DES 2.75 mm x 18 mm (3.0 mm). b. Inf-Lat STEMI 06/16/2015 - dCx Asp Thrombectomy & PCI 3.0 x 15 Xience drDES (3.6 mm).  . Chronic combined systolic and diastolic CHF (congestive heart failure) ([place])   . Chronic headache   . CKD (chronic kidney disease), stage III   . CLL (chronic lymphocytic leukemia) ([place]) 04/08/2011  . Depression   . Diverticulitis   . Gallstone   . GERD (gastroesophageal reflux disease)   . Hypercholesteremia   . Ischemic cardiomyopathy    Echo 06/18/2015 EF 35-40% after lateral MI -> repeat echo 08/06/2015: EF 40-45% with basal-mid inferolateral akinesis and hypokinesis of the lateral wall.  . NSTEMI (non-ST elevated myocardial  infarction) ([place]) 11/23/12   NSTEMI 11/23/2012 RCA lesion - PCI with DES; moderate LAD disease  . PTSD (post-traumatic stress disorder)    With Depression - since MI   Past Surgical History:  Procedure Laterality Date  . APPENDECTOMY  1968-70  . CARDIAC CATHETERIZATION N/A 06/16/2015   Procedure: Left Heart Cath and Coronary Angiography;  Surgeon: Jettie Booze, MD;  Location: [facility];  Service: Cardiovascular: 99% thrombotic dCx -> asp thrombectomy & PCI. RCA Stents Patent.   Marland Kitchen CARDIAC CATHETERIZATION N/A 06/16/2015   Procedure: Coronary Stent Intervention;  Surgeon: Jettie Booze, MD;  Location: [facility];  Service: Cardiovascular: PCI dCx = thrombectomy -> 3.0 x 15 Xience drug-eluting stent (3.6 mm)   . COLONOSCOPY  2008   [id number] diverticula, diminutive polyp in the cecum, s/p bx Normal rectum. Tubular adenoma  . COLONOSCOPY N/A 10/17/2012   Dr. Gala Romney: colonic diverticulosis, tubular adenoma, surveillance due May 2019  . COLONOSCOPY N/A 02/21/2018   Procedure: COLONOSCOPY;  Surgeon: Daneil Dolin, MD;  [facility];  Service: Endoscopy;  Laterality: N/A;  12:00  . CORONARY STENT PLACEMENT  11/23/12   Mid RCA -- Xience eXp - 2.75 mm x 18 mm DES (3.83mm) , Dr. Ellyn Hack  . IR IVC FILTER PLMT / S&I /IMG GUID/MOD SED  05/27/2019  . IR RADIOLOGIST EVAL & MGMT  09/11/2019  . LEFT HEART CATHETERIZATION WITH CORONARY ANGIOGRAM N/A 11/23/2012   Procedure: LEFT HEART CATHETERIZATION WITH CORONARY ANGIOGRAM;  Surgeon: Leonie Man,  MD;  Location: [facility];  Service: Cardiovascular: NSTEMI: Culprit = mid RCA 95% & 75%; LAD ~40-50%, Small RI ~60%; EF ~60%, mild inf-basal HK     . TRANSTHORACIC ECHOCARDIOGRAM  January 2017; March 2017   a. EF 35-40%. Entire inferior hypokinesis and akinesis of the basal and mid inferolateral and anterolateral /distal lateral walls;; b. EF 40 and 45%. Akinesis of basal-mid inferolateral wall. Hypokinesis of entire lateral  wall.    SOCIAL HISTORY:  Social History   Socioeconomic History  . Marital status: Married    Spouse name: Not on file  . Number of children: Not on file  . Years of education: Not on file  . Highest education level: Not on file  Occupational History  . Not on file  Tobacco Use  . Smoking status: Former Smoker    Packs/day: 0.25    Years: 2.00    Pack years: 0.50    Types: Cigarettes    Quit date: 06/07/1963    Years since quitting: 56.9  . Smokeless tobacco: Never Used  Vaping Use  . Vaping Use: Never used  Substance and Sexual Activity  . Alcohol use: No    Alcohol/week: 0.0 standard drinks    Comment: Occasionally drinks red wine  . Drug use: No  . Sexual activity: Not on file  Other Topics Concern  . Not on file  Social History Narrative   Exercises 3 to 4 days a week at the fitness center for about a half an hour at a time.   "former smoker". Does not drink EtOH   Married.   Social Determinants of Health   Financial Resource Strain:   . Difficulty of Paying Living Expenses: Not on file  Food Insecurity:   . Worried About Charity fundraiser in the Last Year: Not on file  . Ran Out of Food in the Last Year: Not on file  Transportation Needs:   . Lack of Transportation (Medical): Not on file  . Lack of Transportation (Non-Medical): Not on file  Physical Activity:   . Days of Exercise per Week: Not on file  . Minutes of Exercise per Session: Not on file  Stress:   . Feeling of Stress : Not on file  Social Connections:   . Frequency of Communication with Friends and Family: Not on file  . Frequency of Social Gatherings with Friends and Family: Not on file  . Attends Religious Services: Not on file  . Active Member of Clubs or Organizations: Not on file  . Attends Archivist Meetings: Not on file  . Marital Status: Not on file  Intimate Partner Violence:   . Fear of Current or Ex-Partner: Not on file  . Emotionally Abused: Not on file  .  Physically Abused: Not on file  . Sexually Abused: Not on file    FAMILY HISTORY:  Family History  Problem Relation Age of Onset  . Diabetes Mother   . Cancer Father        prostate  . Colon cancer Neg Hx     CURRENT MEDICATIONS:  Current Outpatient Medications  Medication Sig Dispense Refill  . ascorbic acid (VITAMIN C) 500 MG tablet Take 1 tablet (500 mg total) by mouth daily.    . cholecalciferol (VITAMIN D) 1000 units tablet Take 1,000 Units by mouth once a week.    . clopidogrel (PLAVIX) 75 MG tablet TAKE 1 TABLET BY MOUTH EVERY DAY 90 tablet 1  . CVS VITAMIN B12  1000 MCG TBCR TAKE 1 TABLET BY MOUTH EVERY DAY 60 tablet 1  . furosemide (LASIX) 20 MG tablet TAKE 1 TABLET BY MOUTH EVERY DAY 90 tablet 1  . lisinopril (ZESTRIL) 2.5 MG tablet Take 1 tablet (2.5 mg total) by mouth daily. 90 tablet 3  . pantoprazole (PROTONIX) 40 MG tablet TAKE 1 TABLET BY MOUTH 30 MINUTES BEFORE A MEAL ONCE OR TWICE DAILY 180 tablet 1  . simvastatin (ZOCOR) 40 MG tablet TAKE 1 TABLET (40 MG TOTAL) BY MOUTH DAILY AT 6 PM. 90 tablet 0  . tamsulosin (FLOMAX) 0.4 MG CAPS capsule TAKE 1 CAPSULE (0.4 MG TOTAL) BY MOUTH DAILY AFTER BREAKFAST. 90 capsule 1   No current facility-administered medications for this visit.    ALLERGIES:  No Known Allergies  PHYSICAL EXAM:  Performance status (ECOG): 1 - Symptomatic but completely ambulatory  Vitals:   04/21/20 1155  BP: (!) 123/54  Pulse: 61  Resp: 17  Temp: 97.6 F (36.4 C)  SpO2: 100%   Wt Readings from Last 3 Encounters:  04/21/20 162 lb 12.8 oz (73.8 kg)  02/18/20 155 lb 12.8 oz (70.7 kg)  10/25/19 149 lb (67.6 kg)   Physical Exam Vitals reviewed.  Constitutional:      Appearance: Normal appearance.  Cardiovascular:     Rate and Rhythm: Normal rate and regular rhythm.     Pulses: Normal pulses.     Heart sounds: Normal heart sounds.  Pulmonary:     Effort: Pulmonary effort is normal.     Breath sounds: Normal breath sounds.   Abdominal:     Palpations: Abdomen is soft. There is no hepatomegaly, splenomegaly or mass.     Tenderness: There is no abdominal tenderness.  Musculoskeletal:     Right lower leg: Edema (2+) present.     Left lower leg: Edema (2+) present.  Lymphadenopathy:     Cervical: No cervical adenopathy.     Upper Body:     Right upper body: No supraclavicular, axillary or pectoral adenopathy.     Left upper body: No supraclavicular, axillary or pectoral adenopathy.  Neurological:     General: No focal deficit present.     Mental Status: He is alert and oriented to person, place, and time.  Psychiatric:        Mood and Affect: Mood normal.        Behavior: Behavior normal.     LABORATORY DATA:  I have reviewed the labs as listed.  CBC Latest Ref Rng & Units 04/14/2020 10/16/2019 07/16/2019  WBC 4.0 - 10.5 K/uL 11.7(H) 9.3 11.2(H)  Hemoglobin 13.0 - 17.0 g/dL 13.5 13.1 10.3(L)  Hematocrit 39 - 52 % 44.7 43.5 34.1(L)  Platelets 150 - 400 K/uL 186 203 318   CMP Latest Ref Rng & Units 04/14/2020 02/18/2020 10/16/2019  Glucose 70 - 99 mg/dL 112(H) 93 89  BUN 8 - 23 mg/dL 24(H) 19 18  Creatinine 0.61 - 1.24 mg/dL 1.52(H) 1.51(H) 1.41(H)  Sodium 135 - 145 mmol/L 141 142 141  Potassium 3.5 - 5.1 mmol/L 3.9 4.3 4.1  Chloride 98 - 111 mmol/L 108 106 107  CO2 22 - 32 mmol/L 25 26 27   Calcium 8.9 - 10.3 mg/dL 9.2 9.6 9.4  Total Protein 6.5 - 8.1 g/dL 6.8 6.4 6.6  Total Bilirubin 0.3 - 1.2 mg/dL 1.2 0.8 0.9  Alkaline Phos 38 - 126 U/L 41 - 39  AST 15 - 41 U/L 20 19 22   ALT 0 - 44 U/L 12  11 15      Component Value Date/Time   RBC 4.65 04/14/2020 1058   MCV 96.1 04/14/2020 1058   MCH 29.0 04/14/2020 1058   MCHC 30.2 04/14/2020 1058   RDW 13.9 04/14/2020 1058   LYMPHSABS 4.6 (H) 04/14/2020 1058   MONOABS 0.7 04/14/2020 1058   EOSABS 0.1 04/14/2020 1058   BASOSABS 0.1 04/14/2020 1058   Lab Results  Component Value Date   TIBC 309 04/14/2020   TIBC 263 10/16/2019   TIBC 241 (L) 07/16/2019    FERRITIN 199 04/14/2020   FERRITIN 343 (H) 10/16/2019   FERRITIN 300 07/16/2019   IRONPCTSAT 28 04/14/2020   IRONPCTSAT 34 10/16/2019   IRONPCTSAT 15 (L) 07/16/2019    DIAGNOSTIC IMAGING:  I have independently reviewed the scans and discussed with the patient. No results found.   ASSESSMENT:  1.  Stage I CLL: -Originally diagnosed in 2008, on watchful waiting since then. -No fevers, night sweats or weight loss.  No recurrent infections.  2.  DVT: -Diagnosed with DVT during hospitalization in December 2020.  IVC filter on 05/27/2019. -Anticoagulation was not started as he had GI bleed secondary to diverticulosis.  3.  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 -Venofer 5 doses from 07/23/2019 through 08/05/2019.   PLAN:  1.  Stage I CLL: -Denies any fevers, night sweats or weight loss.  No recurrent infections. -Review of CBC showed white count 11.7 with absolute lymphocyte count of 4600. -No palpable adenopathy.  RTC 4 months with labs.  2.  DVT: -He is not on any anticoagulation at this time.  3.  Normocytic anemia: -Labs from 04/14/2020 shows hemoglobin 13.5.  Ferritin is 199 and percent saturation is 28. -No parenteral iron therapy needed.  4.  CKD: -His creatinine is between 1.4-1.5.  Latest creatinine is 1.52.   Orders placed this encounter:  Orders Placed This Encounter  Procedures  . CBC with Differential/Platelet  . Comprehensive metabolic panel  . Ferritin  . Iron and TIBC     Derek Jack, MD [place] [phone]   I, Milinda Antis, am acting as a scribe for Dr. Sanda Linger.  I, Derek Jack MD, have reviewed the above documentation for accuracy and completeness, and I agree with the above.

## 2020-05-18 ENCOUNTER — Ambulatory Visit (INDEPENDENT_AMBULATORY_CARE_PROVIDER_SITE_OTHER): Payer: Medicare HMO | Admitting: Internal Medicine

## 2020-05-18 ENCOUNTER — Other Ambulatory Visit: Payer: Self-pay

## 2020-05-18 ENCOUNTER — Encounter (INDEPENDENT_AMBULATORY_CARE_PROVIDER_SITE_OTHER): Payer: Self-pay | Admitting: Internal Medicine

## 2020-05-18 VITALS — BP 124/76 | HR 65 | Temp 97.8°F | Ht 69.0 in | Wt 156.2 lb

## 2020-05-18 DIAGNOSIS — I10 Essential (primary) hypertension: Secondary | ICD-10-CM

## 2020-05-18 DIAGNOSIS — E559 Vitamin D deficiency, unspecified: Secondary | ICD-10-CM | POA: Diagnosis not present

## 2020-05-18 DIAGNOSIS — E785 Hyperlipidemia, unspecified: Secondary | ICD-10-CM

## 2020-05-18 DIAGNOSIS — N401 Enlarged prostate with lower urinary tract symptoms: Secondary | ICD-10-CM | POA: Diagnosis not present

## 2020-05-18 MED ORDER — SIMVASTATIN 40 MG PO TABS: 40.0000 mg | ORAL_TABLET | Freq: Every day | ORAL | 0 refills | Status: DC

## 2020-05-18 NOTE — Progress Notes (Signed)
Metrics: Intervention Frequency ACO  Documented Smoking Status Yearly  Screened one or more times in 24 months  Cessation Counseling or  Active cessation medication Past 24 months  Past 24 months   Guideline developer: UpToDate (See UpToDate for funding source) Date Released: 2014       Wellness Office Visit  Subjective:  Patient ID: [name], male    DOB: 07-08-1945  Age: 74 y.o. MRN: [id number]  CC: This man comes in for follow-up of hypertension, hyperlipidemia, BPH and vitamin D deficiency. HPI  Overall, he is doing very well.  He has no specific complaints. He requires a refill of his statin. He continues with Flomax for his BPH. He sees hematology for his chronic lymphocytic leukemia which appears to be quite stable. He denies any chest pain, dyspnea or palpitations. Past Medical History:  Diagnosis Date  . Acute MI, inferolateral wall, initial episode of care ([place]) 06/16/15   99% dCx   . BPH (benign prostatic hyperplasia) 04/08/2011  . CAD S/P percutaneous coronary angioplasty 11/23/12; 06/16/15   a. NSTEMI 2014 with 2 overlapping mRCA lesions - Xience Xpedition DES 2.75 mm x 18 mm (3.0 mm). b. Inf-Lat STEMI 06/16/2015 - dCx Asp Thrombectomy & PCI 3.0 x 15 Xience drDES (3.6 mm).  . Chronic combined systolic and diastolic CHF (congestive heart failure) ([place])   . Chronic headache   . CKD (chronic kidney disease), stage III ([place])   . CLL (chronic lymphocytic leukemia) ([place]) 04/08/2011  . Depression   . Diverticulitis   . Gallstone   . GERD (gastroesophageal reflux disease)   . Hypercholesteremia   . Ischemic cardiomyopathy    Echo 06/18/2015 EF 35-40% after lateral MI -> repeat echo 08/06/2015: EF 40-45% with basal-mid inferolateral akinesis and hypokinesis of the lateral wall.  . NSTEMI (non-ST elevated myocardial infarction) ([place]) 11/23/12   NSTEMI 11/23/2012 RCA lesion - PCI with DES; moderate LAD disease  . PTSD (post-traumatic stress disorder)    With Depression -  since MI   Past Surgical History:  Procedure Laterality Date  . APPENDECTOMY  1968-70  . CARDIAC CATHETERIZATION N/A 06/16/2015   Procedure: Left Heart Cath and Coronary Angiography;  Surgeon: Jettie Booze, MD;  Location: [facility];  Service: Cardiovascular: 99% thrombotic dCx -> asp thrombectomy & PCI. RCA Stents Patent.   Marland Kitchen CARDIAC CATHETERIZATION N/A 06/16/2015   Procedure: Coronary Stent Intervention;  Surgeon: Jettie Booze, MD;  Location: [facility];  Service: Cardiovascular: PCI dCx = thrombectomy -> 3.0 x 15 Xience drug-eluting stent (3.6 mm)   . COLONOSCOPY  2008   [id number] diverticula, diminutive polyp in the cecum, s/p bx Normal rectum. Tubular adenoma  . COLONOSCOPY N/A 10/17/2012   Dr. Gala Romney: colonic diverticulosis, tubular adenoma, surveillance due May 2019  . COLONOSCOPY N/A 02/21/2018   Procedure: COLONOSCOPY;  Surgeon: Daneil Dolin, MD;  [facility];  Service: Endoscopy;  Laterality: N/A;  12:00  . CORONARY STENT PLACEMENT  11/23/12   Mid RCA -- Xience eXp - 2.75 mm x 18 mm DES (3.30mm) , Dr. Ellyn Hack  . IR IVC FILTER PLMT / S&I /IMG GUID/MOD SED  05/27/2019  . IR RADIOLOGIST EVAL & MGMT  09/11/2019  . LEFT HEART CATHETERIZATION WITH CORONARY ANGIOGRAM N/A 11/23/2012   Procedure: LEFT HEART CATHETERIZATION WITH CORONARY ANGIOGRAM;  Surgeon: Leonie Man, MD;  Location: [facility];  Service: Cardiovascular: NSTEMI: Culprit = mid RCA 95% & 75%; LAD ~40-50%, Small RI ~60%; EF ~  60%, mild inf-basal HK     . TRANSTHORACIC ECHOCARDIOGRAM  January 2017; March 2017   a. EF 35-40%. Entire inferior hypokinesis and akinesis of the basal and mid inferolateral and anterolateral /distal lateral walls;; b. EF 40 and 45%. Akinesis of basal-mid inferolateral wall. Hypokinesis of entire lateral wall.     Family History  Problem Relation Age of Onset  . Diabetes Mother   . Cancer Father        prostate  . Colon cancer Neg Hx     Social  History   Social History Narrative   Exercises 3 to 4 days a week at the fitness center for about a half an hour at a time.   "former smoker". Does not drink EtOH   Married.   Social History   Tobacco Use  . Smoking status: Former Smoker    Packs/day: 0.25    Years: 2.00    Pack years: 0.50    Types: Cigarettes    Quit date: 06/07/1963    Years since quitting: 56.9  . Smokeless tobacco: Never Used  Substance Use Topics  . Alcohol use: No    Alcohol/week: 0.0 standard drinks    Comment: Occasionally drinks red wine    Current Meds  Medication Sig  . cholecalciferol (VITAMIN D) 1000 units tablet Take 1,000 Units by mouth once a week.  . clopidogrel (PLAVIX) 75 MG tablet TAKE 1 TABLET BY MOUTH EVERY DAY  . CVS VITAMIN B12 1000 MCG TBCR TAKE 1 TABLET BY MOUTH EVERY DAY  . furosemide (LASIX) 20 MG tablet TAKE 1 TABLET BY MOUTH EVERY DAY  . lisinopril (ZESTRIL) 2.5 MG tablet Take 1 tablet (2.5 mg total) by mouth daily.  . pantoprazole (PROTONIX) 40 MG tablet TAKE 1 TABLET BY MOUTH 30 MINUTES BEFORE A MEAL ONCE OR TWICE DAILY  . tamsulosin (FLOMAX) 0.4 MG CAPS capsule TAKE 1 CAPSULE (0.4 MG TOTAL) BY MOUTH DAILY AFTER BREAKFAST.  . [DISCONTINUED] simvastatin (ZOCOR) 40 MG tablet TAKE 1 TABLET (40 MG TOTAL) BY MOUTH DAILY AT 6 PM.      Depression screen [facility] 2/9 05/18/2020 11/29/2019 08/19/2019 03/21/2014 12/14/2012  Decreased Interest 0 0 0 0 0  Down, Depressed, Hopeless 0 0 0 0 0  PHQ - 2 Score 0 0 0 0 0  Altered sleeping 0 - - - -  Tired, decreased energy 0 - - - -  Change in appetite 0 - - - -  Feeling bad or failure about yourself  0 - - - -  Trouble concentrating 0 - - - -  Moving slowly or fidgety/restless 0 - - - -  Suicidal thoughts 0 - - - -  PHQ-9 Score 0 - - - -  Difficult doing work/chores Not difficult at all - - - -     Objective:   Today's Vitals: BP 124/76   Pulse 65   Temp 97.8 F (36.6 C) (Temporal)   Ht 5\' 9"  (1.753 m)   Wt 156 lb 3.2 oz (70.9 kg)    SpO2 96%   BMI 23.07 kg/m  Vitals with BMI 05/18/2020 04/21/2020 02/18/2020  Height 5\' 9"  - 5\' 9"   Weight 156 lbs 3 oz 162 lbs 13 oz 155 lbs 13 oz  BMI 50.93 - 23  Systolic 267 124 580  Diastolic 76 54 60  Pulse 65 61 75     Physical Exam   He looks systemically well.  Blood pressure is well controlled for his age.  He is  alert and orientated without any focal neurological signs.    Assessment   1. Benign essential HTN   2. Hyperlipidemia, unspecified hyperlipidemia type   3. Vitamin D deficiency   4. Benign prostatic hyperplasia with lower urinary tract symptoms, symptom details unspecified       Tests ordered Orders Placed This Encounter  Procedures  . PSA, Total with Reflex to PSA, Free     Plan: 1. He will continue with current dose of lisinopril and electrolytes were checked only a month ago and in the normal range. 2. He will continue with statin therapy and I refilled his simvastatin today. 3. He will continue with Flomax and we will check a PSA just to make sure there are no problems there.  He has not had one check for a long time.  It is controversial whether at his age PSA should even be checked but I think it is a good idea. 4. Follow-up with Judson Roch in about 6 months   Meds ordered this encounter  Medications  . simvastatin (ZOCOR) 40 MG tablet    Sig: Take 1 tablet (40 mg total) by mouth daily at 6 PM.    Dispense:  90 tablet    Refill:  0    Hanalei Glace Luther Parody, MD

## 2020-05-19 ENCOUNTER — Telehealth (INDEPENDENT_AMBULATORY_CARE_PROVIDER_SITE_OTHER): Payer: Self-pay

## 2020-05-19 LAB — PSA, TOTAL WITH REFLEX TO PSA, FREE: PSA, Total: 3.3 ng/mL (ref <=4.0)

## 2020-05-19 NOTE — Telephone Encounter (Signed)
What with the oxygen saturation levels?  The O2 saturation needs to be more than 92% on room air and certainly more than 74% on room air during exertion.  Only then I would recommend that oxygen be discontinued.

## 2020-05-19 NOTE — Telephone Encounter (Signed)
Called patient and LMOVM to return call  Left a detailed voice message for patient to call back to discuss. 

## 2020-05-19 NOTE — Progress Notes (Signed)
Please call the patient and let him know that his PSA result was in the normal range.  Thanks.

## 2020-05-19 NOTE — Telephone Encounter (Signed)
Patients wife called and left a voice message that she thinks her husband needs to stop the oxygen and she discussed with [facility] and they stated that she would have to have an order to stop this.   Please advise on how you would like to proceed.

## 2020-05-20 NOTE — Telephone Encounter (Signed)
Called patient and gave him the message and his lab results. Patient stated that he does not have an oxygen meter and will call back to schedule an appointment to have checked if [facility] is not able to check. Patient verbalized an understanding.

## 2020-07-06 ENCOUNTER — Telehealth (INDEPENDENT_AMBULATORY_CARE_PROVIDER_SITE_OTHER): Payer: Self-pay

## 2020-07-06 NOTE — Telephone Encounter (Signed)
Wife sent a letter to have order sent to [facility] to d/c the oxygen. Then they can come pick up supplies.

## 2020-07-06 NOTE — Telephone Encounter (Signed)
Yes correct. They will need a d/c order to be sent to [facility]. Ph: [phone]. Calling them now on This number to get a fax number to completed the order to via send.

## 2020-07-06 NOTE — Telephone Encounter (Signed)
Yes this is a letter I showed you today

## 2020-07-14 ENCOUNTER — Ambulatory Visit: Payer: Medicare HMO | Admitting: Cardiology

## 2020-07-14 ENCOUNTER — Encounter: Payer: Self-pay | Admitting: Cardiology

## 2020-07-14 ENCOUNTER — Other Ambulatory Visit: Payer: Self-pay

## 2020-07-14 VITALS — BP 120/70 | HR 65 | Ht 69.0 in | Wt 162.2 lb

## 2020-07-14 DIAGNOSIS — I2119 ST elevation (STEMI) myocardial infarction involving other coronary artery of inferior wall: Secondary | ICD-10-CM

## 2020-07-14 DIAGNOSIS — E785 Hyperlipidemia, unspecified: Secondary | ICD-10-CM

## 2020-07-14 DIAGNOSIS — I255 Ischemic cardiomyopathy: Secondary | ICD-10-CM | POA: Diagnosis not present

## 2020-07-14 DIAGNOSIS — I5042 Chronic combined systolic (congestive) and diastolic (congestive) heart failure: Secondary | ICD-10-CM | POA: Diagnosis not present

## 2020-07-14 DIAGNOSIS — I10 Essential (primary) hypertension: Secondary | ICD-10-CM

## 2020-07-14 DIAGNOSIS — I2510 Atherosclerotic heart disease of native coronary artery without angina pectoris: Secondary | ICD-10-CM | POA: Diagnosis not present

## 2020-07-14 DIAGNOSIS — Z9861 Coronary angioplasty status: Secondary | ICD-10-CM

## 2020-07-14 DIAGNOSIS — Z87891 Personal history of nicotine dependence: Secondary | ICD-10-CM

## 2020-07-14 DIAGNOSIS — D500 Iron deficiency anemia secondary to blood loss (chronic): Secondary | ICD-10-CM

## 2020-07-14 DIAGNOSIS — R413 Other amnesia: Secondary | ICD-10-CM

## 2020-07-14 MED ORDER — ATORVASTATIN CALCIUM 40 MG PO TABS: 40.0000 mg | ORAL_TABLET | Freq: Every day | ORAL | 3 refills | Status: DC

## 2020-07-14 NOTE — Patient Instructions (Signed)
Medication Instructions:   complete taking Simvastatin and then stop.   Start taking 40 mg  Atorvastatin at bedtime   *If you need a refill on your cardiac medications before your next appointment, please call your pharmacy*   Lab Work:    Testing/Procedures: Not needed   Follow-Up: At [facility], you and your health needs are our priority.  As part of our continuing mission to provide you with exceptional heart care, we have created designated Provider Care Teams.  These Care Teams include your primary Cardiologist (physician) and Advanced Practice Providers (APPs -  Physician Assistants and Nurse Practitioners) who all work together to provide you with the care you need, when you need it.  We recommend signing up for the patient portal called "MyChart".  Sign up information is provided on this After Visit Summary.  MyChart is used to connect with patients for Virtual Visits (Telemedicine).  Patients are able to view lab/test results, encounter notes, upcoming appointments, etc.  Non-urgent messages can be sent to your provider as well.   To learn more about what you can do with MyChart, go to [email or web address].    Your next appointment:   6 month(s)  The format for your next appointment:   In Person  Provider:   You will see one of the following Advanced Practice Providers on your designated Care Team:    Ermalinda Barrios, PA-C   Then, a Cardiologist in [place]  will plan to see you again in 12  month(s).    Other Instructions

## 2020-07-14 NOTE — Progress Notes (Signed)
Primary Care Provider: [name], MD Cardiologist: Glenetta Hew, MD Electrophysiologist: None   Clinic Note: Chief Complaint  Patient presents with  . Follow-up    Doing well.  Last visit was telemedicine with PA.  Marland Kitchen Coronary Artery Disease    No angina   ===================================  ASSESSMENT/PLAN   Problem List Items Addressed This Visit    Memory loss    He clearly expressed that he has some sundowning issues. Monitor symptoms with his statin. => We may need to convert to rosuvastatin although probably was a potential issue with rosuvastatin in the past.       Iron deficiency anemia due to chronic blood loss    Most recent hemoglobin level was 13.5 back in November.  Stable.  No melena, hematochezia or hematuria.      CAD S/P PCI-- RCA DES 2014,  CFX DES Jan 2017 (Chronic)    DES stents in the RCA and LCx.  No recurrent angina symptoms.  Plan: Continue monotherapy with Plavix.  Okay to hold Plavix 5 days preop for surgeries or procedures.      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 Follow-up echocardiogram showed EF of 40 to 45% based akinesis and lateral hypokinesis.  Probably combination of RCA and LCx infarct.  No recurrent angina or heart failure symptoms. Blood pressures limit titration medications.      Relevant Medications   atorvastatin (LIPITOR) 40 MG tablet   History of smoking (Chronic)    No further smoking.      Dyslipidemia, goal LDL below 70 - on simvastatin; monitored by PCP (Chronic)    Labs have been followed by PCP.  Due to be rechecked probably this spring. He is on simvastatin.  With his memory loss issues as well as LDL not quite at goal, he needs more aggressive management, we will switch him to atorvastatin 40 mg  from simvastatin.      Relevant Medications   atorvastatin (LIPITOR) 40 MG tablet   Essential hypertension (Chronic)    Blood pressure looks great today.  Did not tolerate increased dose of ACE inhibitor.  Dizziness improved with titration back to 2.5 mg daily.  Stable.      Relevant Medications   atorvastatin (LIPITOR) 40 MG tablet   Ischemic cardiomyopathy (Chronic)    EF 40 to 45% due to combined non-STEMI NSTEMI.  No active heart failure symptoms.  Minimal if not trace edema taking low-dose furosemide. He did not tolerate the increase his lisinopril from my last visit with him.  He is only on very low-dose 2.5 mg lisinopril.  Not on beta-blocker because of bradycardia.  NYHA Class I-II symptoms.      Relevant Medications   atorvastatin (LIPITOR) 40 MG tablet   Other Relevant Orders   EKG 12-Lead (Completed)   Coronary artery disease involving native coronary artery of native heart without angina pectoris - Primary (Chronic)    On stable regimen including Plavix, statin and ACE inhibitor.  Not on beta-blocker because of bradycardia.      Relevant Medications   atorvastatin (LIPITOR) 40 MG tablet    Other Visit Diagnoses    Chronic combined systolic and diastolic CHF (congestive heart failure) (HCC)       Relevant Medications   atorvastatin (LIPITOR) 40 MG tablet  Other Relevant Orders   EKG 12-Lead (Completed)   Hyperlipidemia LDL goal <70       Relevant Medications   atorvastatin (LIPITOR) 40 MG tablet     ===================================  HPI:    [name] is a 75 y.o. male with a PMH notable for CAD (PCI to RCA and LCx), HTN, HLD and CLL who presents today for 19-month follow-up.   June 2014: Progressive angina symptoms with PCI to the RCA    January 2017 - Inferolateral STEMI in  with significant circumflex disease - PCI LCx.  ? mildly reduced EF of 40-45% on follow-up Echo.  [name] was last seen on April 22, 2019.  Was still exercising  at the fitness center about 3 to 4 days.  Had hurt his right ankle which delayed him doing some.  No active angina.  Mild exertional dyspnea and mild edema.=> We increase lisinopril to 5 mg daily.  He was seen by Sharrell Ku, PA via telemedicine on Oct 25, 2019.  Apparently his PCP was seeing him for his forgetfulness.  No chest pain or dyspnea noted.  No edema or palpitations.  No orthopnea.  She noted that he has stopped his aspirin.  My plan was for him to be on Plavix without aspirin.  No beta-blocker due to resting bradycardia.  Talked about sodium restriction.  Blood pressures were 100-110/50 mmHg. No dizziness.  => Recommended reducing lisinopril dose to 2.5 mg.    Recent Hospitalizations: n/a  Reviewed  CV studies:    The following studies were reviewed today: (if available, images/films reviewed: From Epic Chart or Care Everywhere) . n/a:  Interval History:   [name] returns here today accompanied by his wife who helps out with most of the history.  Other than the fact that he had COVID shortly after I saw him in November 2020, he has been doing relatively well.  He still has left-sided swelling more than right, but no PND orthopnea.  He is not very active, but when he does odd jobs around the house, or walks around in stores, he denies any chest pain or pressure.  Does not do a lot of walking exercise because of unsteady gait.  CV Review of Symptoms (Summary):  no chest pain or dyspnea on exertion positive for - Mild swelling left greater than right ankle. negative for - irregular heartbeat, orthopnea, palpitations, paroxysmal nocturnal dyspnea, rapid heart rate, shortness of breath or Lightheadedness or dizziness, syncope/near syncope or TIA/amaurosis fugax; claudication  The patient does not have symptoms concerning for COVID-19 infection (fever, chills, cough, or new shortness of breath).   REVIEWED OF SYSTEMS   Review of Systems  Constitutional: Negative for malaise/fatigue  (Mild exercise intolerance, and low energy, but not really fatigued.) and weight loss (He put back on the weight he lost during Covid).  HENT: Negative for congestion and nosebleeds.   Respiratory: Negative for cough and shortness of breath.   Cardiovascular: Positive for leg swelling (See above).  Gastrointestinal: Positive for constipation (Off and on.). Negative for blood in stool and melena.  Genitourinary: Negative for frequency and hematuria.  Musculoskeletal: Positive for back pain and joint pain. Negative for falls.  Neurological: Positive for dizziness (Less orthostatic dizziness with reduced dose of lisinopril.). Negative for focal weakness, weakness and headaches.  Psychiatric/Behavioral: Positive for memory loss. Negative for depression. The patient is not nervous/anxious and does not have insomnia.        Also poor comprehension  I have reviewed and (if needed) personally updated the patient's problem list, medications, allergies, past medical and surgical history, social and family history.   PAST MEDICAL HISTORY   Past Medical History:  Diagnosis Date  . Acute MI, inferolateral wall, initial episode of care ([place]) 06/16/15   99% dCx   . BPH (benign prostatic hyperplasia) 04/08/2011  . CAD S/P percutaneous coronary angioplasty 11/23/12; 06/16/15   a. NSTEMI 2014 with 2 overlapping mRCA lesions - Xience Xpedition DES 2.75 mm x 18 mm (3.0 mm). b. Inf-Lat STEMI 06/16/2015 - dCx Asp Thrombectomy & PCI 3.0 x 15 Xience drDES (3.6 mm).  . Chronic combined systolic and diastolic CHF (congestive heart failure) ([place])   . Chronic headache   . CKD (chronic kidney disease), stage III ([place])   . CLL (chronic lymphocytic leukemia) ([place]) 04/08/2011  . Depression   . Diverticulitis   . Gallstone   . GERD (gastroesophageal reflux disease)   . Hypercholesteremia   . Ischemic cardiomyopathy    Echo 06/18/2015 EF 35-40% after lateral MI -> repeat echo 08/06/2015: EF 40-45% with basal-mid  inferolateral akinesis and hypokinesis of the lateral wall.  . NSTEMI (non-ST elevated myocardial infarction) ([place]) 11/23/12   NSTEMI 11/23/2012 RCA lesion - PCI with DES; moderate LAD disease  . PTSD (post-traumatic stress disorder)    With Depression - since MI    PAST SURGICAL HISTORY   Past Surgical History:  Procedure Laterality Date  . APPENDECTOMY  1968-70  . CARDIAC CATHETERIZATION N/A 06/16/2015   Procedure: Left Heart Cath and Coronary Angiography;  Surgeon: Jettie Booze, MD;  Location: [facility];  Service: Cardiovascular: 99% thrombotic dCx -> asp thrombectomy & PCI. RCA Stents Patent.   Marland Kitchen CARDIAC CATHETERIZATION N/A 06/16/2015   Procedure: Coronary Stent Intervention;  Surgeon: Jettie Booze, MD;  Location: [facility];  Service: Cardiovascular: PCI dCx = thrombectomy -> 3.0 x 15 Xience drug-eluting stent (3.6 mm)   . COLONOSCOPY  2008   HER:[id number] diverticula, diminutive polyp in the cecum, s/p bx Normal rectum. Tubular adenoma  . COLONOSCOPY N/A 10/17/2012   Dr. Gala Romney: colonic diverticulosis, tubular adenoma, surveillance due May 2019  . COLONOSCOPY N/A 02/21/2018   Procedure: COLONOSCOPY;  Surgeon: Daneil Dolin, MD;  [facility];  Service: Endoscopy;  Laterality: N/A;  12:00  . CORONARY STENT PLACEMENT  11/23/12   Mid RCA -- Xience eXp - 2.75 mm x 18 mm DES (3.50mm) , Dr. Ellyn Hack  . IR IVC FILTER PLMT / S&I /IMG GUID/MOD SED  05/27/2019  . IR RADIOLOGIST EVAL & MGMT  09/11/2019  . LEFT HEART CATHETERIZATION WITH CORONARY ANGIOGRAM N/A 11/23/2012   Procedure: LEFT HEART CATHETERIZATION WITH CORONARY ANGIOGRAM;  Surgeon: Leonie Man, MD;  Location: [facility];  Service: Cardiovascular: NSTEMI: Culprit = mid RCA 95% & 75%; LAD ~40-50%, Small RI ~60%; EF ~60%, mild inf-basal HK     . TRANSTHORACIC ECHOCARDIOGRAM  January 2017; March 2017   a. EF 35-40%. Entire inferior hypokinesis and akinesis of the basal and mid inferolateral and  anterolateral /distal lateral walls;; b. EF 40 and 45%. Akinesis of basal-mid inferolateral wall. Hypokinesis of entire lateral wall.    Cath - PCI Jan 2017: 3.0 x 15 Xience drug-eluting stent (3.6 mm) - dLCx; patent RCA stent    Immunization History  Administered Date(s) Administered  . Fluad Quad(high Dose 65+) 02/22/2019  . Influenza, High Dose Seasonal PF 01/30/2018  . Influenza,inj,Quad PF,6+ Mos 03/21/2014  .  Influenza-Unspecified 02/22/2019, 01/28/2020  . Moderna Sars-Covid-2 Vaccination 08/19/2019, 09/16/2019  . Pneumococcal Conjugate-13 03/19/2018  . Pneumococcal Polysaccharide-23 02/18/2020    MEDICATIONS/ALLERGIES   Current Meds  Medication Sig  . atorvastatin (LIPITOR) 40 MG tablet Take 1 tablet (40 mg total) by mouth daily.  . CVS VITAMIN B12 1000 MCG TBCR TAKE 1 TABLET BY MOUTH EVERY DAY  . lisinopril (ZESTRIL) 2.5 MG tablet Take 1 tablet (2.5 mg total) by mouth daily.  . pantoprazole (PROTONIX) 40 MG tablet TAKE 1 TABLET BY MOUTH 30 MINUTES BEFORE A MEAL ONCE OR TWICE DAILY  . tamsulosin (FLOMAX) 0.4 MG CAPS capsule TAKE 1 CAPSULE (0.4 MG TOTAL) BY MOUTH DAILY AFTER BREAKFAST.  . [DISCONTINUED] clopidogrel (PLAVIX) 75 MG tablet TAKE 1 TABLET BY MOUTH EVERY DAY  . [DISCONTINUED] furosemide (LASIX) 20 MG tablet TAKE 1 TABLET BY MOUTH EVERY DAY  . [DISCONTINUED] simvastatin (ZOCOR) 40 MG tablet Take 1 tablet (40 mg total) by mouth daily at 6 PM.    No Known Allergies  SOCIAL HISTORY/FAMILY HISTORY   Reviewed in Epic:  Pertinent findings:  Social History   Tobacco Use  . Smoking status: Former Smoker    Packs/day: 0.25    Years: 2.00    Pack years: 0.50    Types: Cigarettes    Quit date: 06/07/1963    Years since quitting: 57.1  . Smokeless tobacco: Never Used  Vaping Use  . Vaping Use: Never used  Substance Use Topics  . Alcohol use: No    Alcohol/week: 0.0 standard drinks    Comment: Occasionally drinks red wine  . Drug use: No   Social  History   Social History Narrative   Exercises 3 to 4 days a week at the fitness center for about a half an hour at a time.   "former smoker". Does not drink EtOH   Married.    OBJCTIVE -PE, EKG, labs   Wt Readings from Last 3 Encounters:  07/14/20 162 lb 3.2 oz (73.6 kg)  05/18/20 156 lb 3.2 oz (70.9 kg)  04/21/20 162 lb 12.8 oz (73.8 kg)    Physical Exam: BP 120/70   Pulse 65   Ht 5\' 9"  (1.753 m)   Wt 162 lb 3.2 oz (73.6 kg)   BMI 23.95 kg/m  Physical Exam Vitals reviewed.  Constitutional:      General: He is not in acute distress.    Appearance: Normal appearance. He is normal weight. He is not ill-appearing or toxic-appearing.  HENT:     Head: Normocephalic and atraumatic.     Ears:     Comments: Very hard of hearing Neck:     Vascular: No carotid bruit or JVD.  Cardiovascular:     Rate and Rhythm: Normal rate and regular rhythm. Occasional extrasystoles are present.    Chest Wall: PMI is not displaced.     Pulses: Intact distal pulses. Decreased pulses (Mildly reduced because of trace edema.).     Heart sounds: S1 normal and S2 normal. Heart sounds are distant. No murmur heard. No friction rub. No gallop.   Pulmonary:     Effort: Pulmonary effort is normal. No respiratory distress.     Breath sounds: Normal breath sounds.  Chest:     Chest wall: No tenderness.  Musculoskeletal:        General: Swelling (Trivial bilateral) present. Normal range of motion.     Cervical back: Normal range of motion and neck supple.     Comments: Mild varicose veins  but no significant stasis dermatitis.  Skin:    General: Skin is warm and dry.     Coloration: Skin is not jaundiced or pale.  Neurological:     General: No focal deficit present.     Mental Status: He is alert and oriented to person, place, and time.     Motor: No weakness.     Gait: Gait abnormal (Slow, wide-based but steady gait).  Psychiatric:        Mood and Affect: Mood normal.     Comments: Flat affect.   Poor comprehension.  Poor historian.      Adult ECG Report  Rate: 65 ;  Rhythm: normal sinus rhythm and premature ventricular contractions (PVC);  LVH.  Inferior MI, age-indeterminate.  Narrative Interpretation: Stable  Recent Labs: Reviewed.  Should be due for follow-up soon. Lab Results  Component Value Date   CHOL 139 10/14/2019   HDL 38 (L) 10/14/2019   LDLCALC 77 10/14/2019   TRIG 143 10/14/2019   CHOLHDL 3.7 10/14/2019   Lab Results  Component Value Date   CREATININE 1.52 (H) 04/14/2020   BUN 24 (H) 04/14/2020   NA 141 04/14/2020   K 3.9 04/14/2020   CL 108 04/14/2020   CO2 25 04/14/2020   CBC Latest Ref Rng & Units 04/14/2020 10/16/2019 07/16/2019  WBC 4.0 - 10.5 K/uL 11.7(H) 9.3 11.2(H)  Hemoglobin 13.0 - 17.0 g/dL 13.5 13.1 10.3(L)  Hematocrit 39.0 - 52.0 % 44.7 43.5 34.1(L)  Platelets 150 - 400 K/uL 186 203 318    Lab Results  Component Value Date   TSH 1.19 07/09/2019    ==================================================  COVID-19 Education: The signs and symptoms of COVID-19 were discussed with the patient and how to seek care for testing (follow up with PCP or arrange E-visit).   The importance of social distancing and COVID-19 vaccination was discussed today. The patient is practicing social distancing & Masking.   I spent a total of 25 minutes with the patient spent in direct patient consultation.  Additional time spent with chart review  / charting (studies, outside notes, etc): 10 min Total Time: 35 min  Current medicines are reviewed at length with the patient today.  (+/- concerns) n/a **He and his wife indicated it is quite difficult for them to make the trip all the way down to [place].  Working to try to arrange for him to follow-up with an MD in either [place] or [employer] => initial follow-up will be with PA (with plans to f/u with one of my partners in)  This visit occurred during the SARS-CoV-2 public health emergency.  Safety  protocols were in place, including screening questions prior to the visit, additional usage of staff PPE, and extensive cleaning of exam room while observing appropriate contact time as indicated for disinfecting solutions.  Notice: This dictation was prepared with Dragon dictation along with smaller phrase technology. Any transcriptional errors that result from this process are unintentional and may not be corrected upon review.  Patient Instructions / Medication Changes & Studies & Tests Ordered   Patient Instructions  Medication Instructions:   complete taking Simvastatin and then stop.   Start taking 40 mg  Atorvastatin at bedtime   *If you need a refill on your cardiac medications before your next appointment, please call your pharmacy*   Lab Work:    Testing/Procedures: Not needed   Follow-Up: At [facility], you and your health needs are our priority.  As part of our  continuing mission to provide you with exceptional heart care, we have created designated Provider Care Teams.  These Care Teams include your primary Cardiologist (physician) and Advanced Practice Providers (APPs -  Physician Assistants and Nurse Practitioners) who all work together to provide you with the care you need, when you need it.  We recommend signing up for the patient portal called "MyChart".  Sign up information is provided on this After Visit Summary.  MyChart is used to connect with patients for Virtual Visits (Telemedicine).  Patients are able to view lab/test results, encounter notes, upcoming appointments, etc.  Non-urgent messages can be sent to your provider as well.   To learn more about what you can do with MyChart, go to [email or web address].    Your next appointment:   6 month(s)  The format for your next appointment:   In Person  Provider:   You will see one of the following Advanced Practice Providers on your designated Care Team:    Ermalinda Barrios, PA-C   Then, a Cardiologist  in [place]  will plan to see you again in 12  month(s).    Other Instructions    Studies Ordered:   Orders Placed This Encounter  Procedures  . EKG 12-Lead     Glenetta Hew, M.D., M.S. Interventional Cardiologist   Pager # [phone] Phone # [phone] [address] [place], [place] [address]   Thank you for choosing Heartcare at [facility]!!

## 2020-07-16 ENCOUNTER — Other Ambulatory Visit (INDEPENDENT_AMBULATORY_CARE_PROVIDER_SITE_OTHER): Payer: Self-pay | Admitting: Nurse Practitioner

## 2020-07-16 DIAGNOSIS — I214 Non-ST elevation (NSTEMI) myocardial infarction: Secondary | ICD-10-CM

## 2020-08-02 ENCOUNTER — Encounter: Payer: Self-pay | Admitting: Cardiology

## 2020-08-02 DIAGNOSIS — I2510 Atherosclerotic heart disease of native coronary artery without angina pectoris: Secondary | ICD-10-CM | POA: Insufficient documentation

## 2020-08-02 NOTE — Assessment & Plan Note (Signed)
Most recent hemoglobin level was 13.5 back in November.  Stable.  No melena, hematochezia or hematuria.

## 2020-08-02 NOTE — Assessment & Plan Note (Signed)
Previous history of non-STEMI in 2014 (RCA PCI) now history of inferolateral STEMI in 2017 (subtotal LCx-PCI to LCx). Follow-up echocardiogram showed EF of 40 to 45% based akinesis and lateral hypokinesis.  Probably combination of RCA and LCx infarct.  No recurrent angina or heart failure symptoms. Blood pressures limit titration medications.

## 2020-08-02 NOTE — Assessment & Plan Note (Signed)
Labs have been followed by PCP.  Due to be rechecked probably this spring. He is on simvastatin.  With his memory loss issues as well as LDL not quite at goal, he needs more aggressive management, we will switch him to atorvastatin 40 mg from simvastatin.

## 2020-08-02 NOTE — Assessment & Plan Note (Signed)
EF 40 to 45% due to combined non-STEMI NSTEMI.  No active heart failure symptoms.  Minimal if not trace edema taking low-dose furosemide. He did not tolerate the increase his lisinopril from my last visit with him.  He is only on very low-dose 2.5 mg lisinopril.  Not on beta-blocker because of bradycardia.  NYHA Class I-II symptoms.

## 2020-08-02 NOTE — Assessment & Plan Note (Signed)
DES stents in the RCA and LCx.  No recurrent angina symptoms.  Plan: Continue monotherapy with Plavix.  Okay to hold Plavix 5 days preop for surgeries or procedures.

## 2020-08-02 NOTE — Assessment & Plan Note (Signed)
He clearly expressed that he has some sundowning issues. Monitor symptoms with his statin. => We may need to convert to rosuvastatin although probably was a potential issue with rosuvastatin in the past.

## 2020-08-02 NOTE — Assessment & Plan Note (Addendum)
Blood pressure looks great today.  Did not tolerate increased dose of ACE inhibitor.  Dizziness improved with titration back to 2.5 mg daily.  Stable.

## 2020-08-02 NOTE — Assessment & Plan Note (Signed)
On stable regimen including Plavix, statin and ACE inhibitor.  Not on beta-blocker because of bradycardia.

## 2020-08-02 NOTE — Assessment & Plan Note (Signed)
No further smoking.

## 2020-08-13 ENCOUNTER — Inpatient Hospital Stay (HOSPITAL_COMMUNITY): Payer: Medicare HMO | Attending: Hematology

## 2020-08-13 ENCOUNTER — Other Ambulatory Visit: Payer: Self-pay

## 2020-08-13 DIAGNOSIS — N183 Chronic kidney disease, stage 3 unspecified: Secondary | ICD-10-CM | POA: Diagnosis not present

## 2020-08-13 DIAGNOSIS — Z79899 Other long term (current) drug therapy: Secondary | ICD-10-CM | POA: Diagnosis not present

## 2020-08-13 DIAGNOSIS — N400 Enlarged prostate without lower urinary tract symptoms: Secondary | ICD-10-CM | POA: Diagnosis not present

## 2020-08-13 DIAGNOSIS — Z86718 Personal history of other venous thrombosis and embolism: Secondary | ICD-10-CM | POA: Insufficient documentation

## 2020-08-13 DIAGNOSIS — I252 Old myocardial infarction: Secondary | ICD-10-CM | POA: Diagnosis not present

## 2020-08-13 DIAGNOSIS — D649 Anemia, unspecified: Secondary | ICD-10-CM

## 2020-08-13 DIAGNOSIS — D631 Anemia in chronic kidney disease: Secondary | ICD-10-CM | POA: Insufficient documentation

## 2020-08-13 DIAGNOSIS — Z87891 Personal history of nicotine dependence: Secondary | ICD-10-CM | POA: Insufficient documentation

## 2020-08-13 DIAGNOSIS — C9110 Chronic lymphocytic leukemia of B-cell type not having achieved remission: Secondary | ICD-10-CM | POA: Diagnosis not present

## 2020-08-13 LAB — CBC WITH DIFFERENTIAL/PLATELET
Abs Immature Granulocytes: 0.04 10*3/uL (ref 0.00–0.07)
Basophils Absolute: 0.1 10*3/uL (ref 0.0–0.1)
Basophils Relative: 1 %
Eosinophils Absolute: 0.3 10*3/uL (ref 0.0–0.5)
Eosinophils Relative: 3 %
HCT: 48.9 % (ref 39.0–52.0)
Hemoglobin: 14.9 g/dL (ref 13.0–17.0)
Immature Granulocytes: 0 %
Lymphocytes Relative: 52 %
Lymphs Abs: 5.5 10*3/uL — ABNORMAL HIGH (ref 0.7–4.0)
MCH: 29.4 pg (ref 26.0–34.0)
MCHC: 30.5 g/dL (ref 30.0–36.0)
MCV: 96.4 fL (ref 80.0–100.0)
Monocytes Absolute: 0.6 10*3/uL (ref 0.1–1.0)
Monocytes Relative: 6 %
Neutro Abs: 3.9 10*3/uL (ref 1.7–7.7)
Neutrophils Relative %: 38 %
Platelets: 205 10*3/uL (ref 150–400)
RBC: 5.07 MIL/uL (ref 4.22–5.81)
RDW: 13.7 % (ref 11.5–15.5)
WBC: 10.3 10*3/uL (ref 4.0–10.5)
nRBC: 0 % (ref 0.0–0.2)

## 2020-08-13 LAB — COMPREHENSIVE METABOLIC PANEL
ALT: 14 U/L (ref 0–44)
AST: 21 U/L (ref 15–41)
Albumin: 4 g/dL (ref 3.5–5.0)
Alkaline Phosphatase: 49 U/L (ref 38–126)
Anion gap: 9 (ref 5–15)
BUN: 17 mg/dL (ref 8–23)
CO2: 26 mmol/L (ref 22–32)
Calcium: 9.4 mg/dL (ref 8.9–10.3)
Chloride: 105 mmol/L (ref 98–111)
Creatinine, Ser: 1.66 mg/dL — ABNORMAL HIGH (ref 0.61–1.24)
GFR, Estimated: 42 mL/min — ABNORMAL LOW (ref >60)
Glucose, Bld: 92 mg/dL (ref 70–99)
Potassium: 4.1 mmol/L (ref 3.5–5.1)
Sodium: 140 mmol/L (ref 135–145)
Total Bilirubin: 0.8 mg/dL (ref 0.3–1.2)
Total Protein: 6.8 g/dL (ref 6.5–8.1)

## 2020-08-13 LAB — IRON AND TIBC
Iron: 70 ug/dL (ref 45–182)
Saturation Ratios: 24 % (ref 17.9–39.5)
TIBC: 298 ug/dL (ref 250–450)
UIBC: 228 ug/dL

## 2020-08-13 LAB — FERRITIN: Ferritin: 210 ng/mL (ref 24–336)

## 2020-08-20 ENCOUNTER — Inpatient Hospital Stay (HOSPITAL_BASED_OUTPATIENT_CLINIC_OR_DEPARTMENT_OTHER): Payer: Medicare HMO | Admitting: Oncology

## 2020-08-20 ENCOUNTER — Ambulatory Visit (HOSPITAL_COMMUNITY): Payer: Medicare HMO

## 2020-08-20 ENCOUNTER — Other Ambulatory Visit: Payer: Self-pay

## 2020-08-20 DIAGNOSIS — R7989 Other specified abnormal findings of blood chemistry: Secondary | ICD-10-CM

## 2020-08-20 DIAGNOSIS — D500 Iron deficiency anemia secondary to blood loss (chronic): Secondary | ICD-10-CM

## 2020-08-20 DIAGNOSIS — C9110 Chronic lymphocytic leukemia of B-cell type not having achieved remission: Secondary | ICD-10-CM

## 2020-08-20 NOTE — Progress Notes (Signed)
[name], [place] [address]   CLINIC:  Medical Oncology/Hematology  PCP:  [name] Albee, MD [place] / [place] [address]  [phone]  REASON FOR VISIT:  Follow-up for CLL and anemia  PRIOR THERAPY: None  CURRENT THERAPY: Observation and intermittent Venofer  I connected with [name] on 08/20/20 at 10:00 AM EDT by telephone visit and verified that I am speaking with the correct person using two identifiers.   I discussed the limitations, risks, security and privacy concerns of performing an evaluation and management service by telemedicine and the availability of in-person appointments. I also discussed with the patient that there may be a patient responsible charge related to this service. The patient expressed understanding and agreed to proceed.   Other persons participating in the visit and their role in the encounter: None   Patient's location: Home Provider's location: Clinic  INTERVAL HISTORY:  [name], a 75 y.o. male, returns for routine follow-up for his CLL and anemia. [name] was last seen on 04/21/20.  Today, he reports doing well.  He denies any new symptoms since his last visit in November.  Specifically denies recent infections, fevers, chills, night sweats or adenopathy.  He denies any shortness of breath or dyspnea.  REVIEW OF SYSTEMS:  Review of Systems  Constitutional: Negative for chills, diaphoresis and fever.  Respiratory: Negative for shortness of breath.   Hematological: Negative for adenopathy.    PAST MEDICAL/SURGICAL HISTORY:  Past Medical History:  Diagnosis Date  . Acute MI, inferolateral wall, initial episode of care ([facility]) 06/16/15   99% dCx   . BPH (benign prostatic hyperplasia) 04/08/2011  . CAD S/P percutaneous coronary angioplasty 11/23/12; 06/16/15   a. NSTEMI 2014 with 2 overlapping mRCA lesions - Xience Xpedition DES 2.75 mm x 18 mm (3.0 mm). b. Inf-Lat STEMI 06/16/2015  - dCx Asp Thrombectomy & PCI 3.0 x 15 Xience drDES (3.6 mm).  . Chronic combined systolic and diastolic CHF (congestive heart failure) ([facility])   . Chronic headache   . CKD (chronic kidney disease), stage III ([facility])   . CLL (chronic lymphocytic leukemia) ([facility]) 04/08/2011  . Depression   . Diverticulitis   . Gallstone   . GERD (gastroesophageal reflux disease)   . Hypercholesteremia   . Ischemic cardiomyopathy    Echo 06/18/2015 EF 35-40% after lateral MI -> repeat echo 08/06/2015: EF 40-45% with basal-mid inferolateral akinesis and hypokinesis of the lateral wall.  . NSTEMI (non-ST elevated myocardial infarction) ([facility]) 11/23/12   NSTEMI 11/23/2012 RCA lesion - PCI with DES; moderate LAD disease  . PTSD (post-traumatic stress disorder)    With Depression - since MI   Past Surgical History:  Procedure Laterality Date  . APPENDECTOMY  1968-70  . CARDIAC CATHETERIZATION N/A 06/16/2015   Procedure: Left Heart Cath and Coronary Angiography;  Surgeon: Jettie Booze, MD;  Location: [facility];  Service: Cardiovascular: 99% thrombotic dCx -> asp thrombectomy & PCI. RCA Stents Patent.   Marland Kitchen CARDIAC CATHETERIZATION N/A 06/16/2015   Procedure: Coronary Stent Intervention;  Surgeon: Jettie Booze, MD;  Location: [facility];  Service: Cardiovascular: PCI dCx = thrombectomy -> 3.0 x 15 Xience drug-eluting stent (3.6 mm)   . COLONOSCOPY  2008   IRC:VELF-YBOFB diverticula, diminutive polyp in the cecum, s/p bx Normal rectum. Tubular adenoma  . COLONOSCOPY N/A 10/17/2012   Dr. Gala Romney: colonic diverticulosis, tubular adenoma, surveillance due May 2019  .  COLONOSCOPY N/A 02/21/2018   Procedure: COLONOSCOPY;  Surgeon: Daneil Dolin, MD;  [facility];  Service: Endoscopy;  Laterality: N/A;  12:00  . CORONARY STENT PLACEMENT  11/23/12   Mid RCA -- Xience eXp - 2.75 mm x 18 mm DES (3.18mm) , Dr. Ellyn Hack  . IR IVC FILTER PLMT / S&I /IMG GUID/MOD SED  05/27/2019  . IR RADIOLOGIST EVAL  & MGMT  09/11/2019  . LEFT HEART CATHETERIZATION WITH CORONARY ANGIOGRAM N/A 11/23/2012   Procedure: LEFT HEART CATHETERIZATION WITH CORONARY ANGIOGRAM;  Surgeon: Leonie Man, MD;  Location: [facility];  Service: Cardiovascular: NSTEMI: Culprit = mid RCA 95% & 75%; LAD ~40-50%, Small RI ~60%; EF ~60%, mild inf-basal HK     . TRANSTHORACIC ECHOCARDIOGRAM  January 2017; March 2017   a. EF 35-40%. Entire inferior hypokinesis and akinesis of the basal and mid inferolateral and anterolateral /distal lateral walls;; b. EF 40 and 45%. Akinesis of basal-mid inferolateral wall. Hypokinesis of entire lateral wall.    SOCIAL HISTORY:  Social History   Socioeconomic History  . Marital status: Married    Spouse name: Not on file  . Number of children: Not on file  . Years of education: Not on file  . Highest education level: Not on file  Occupational History  . Not on file  Tobacco Use  . Smoking status: Former Smoker    Packs/day: 0.25    Years: 2.00    Pack years: 0.50    Types: Cigarettes    Quit date: 06/07/1963    Years since quitting: 57.2  . Smokeless tobacco: Never Used  Vaping Use  . Vaping Use: Never used  Substance and Sexual Activity  . Alcohol use: No    Alcohol/week: 0.0 standard drinks    Comment: Occasionally drinks red wine  . Drug use: No  . Sexual activity: Not on file  Other Topics Concern  . Not on file  Social History Narrative   Exercises 3 to 4 days a week at the fitness center for about a half an hour at a time.   "former smoker". Does not drink EtOH   Married.   Social Determinants of Health   Financial Resource Strain: Not on file  Food Insecurity: Not on file  Transportation Needs: Not on file  Physical Activity: Not on file  Stress: Not on file  Social Connections: Not on file  Intimate Partner Violence: Not on file    FAMILY HISTORY:  Family History  Problem Relation Age of Onset  . Diabetes Mother   . Cancer Father        prostate  . Colon  cancer Neg Hx     CURRENT MEDICATIONS:  Current Outpatient Medications  Medication Sig Dispense Refill  . atorvastatin (LIPITOR) 40 MG tablet Take 1 tablet (40 mg total) by mouth daily. 90 tablet 3  . clopidogrel (PLAVIX) 75 MG tablet TAKE 1 TABLET BY MOUTH EVERY DAY 90 tablet 1  . CVS VITAMIN B12 1000 MCG TBCR TAKE 1 TABLET BY MOUTH EVERY DAY 60 tablet 1  . furosemide (LASIX) 20 MG tablet TAKE 1 TABLET BY MOUTH EVERY DAY 90 tablet 1  . lisinopril (ZESTRIL) 2.5 MG tablet Take 1 tablet (2.5 mg total) by mouth daily. 90 tablet 3  . pantoprazole (PROTONIX) 40 MG tablet TAKE 1 TABLET BY MOUTH 30 MINUTES BEFORE A MEAL ONCE OR TWICE DAILY 180 tablet 1  . tamsulosin (FLOMAX) 0.4 MG CAPS capsule TAKE 1 CAPSULE (0.4  MG TOTAL) BY MOUTH DAILY AFTER BREAKFAST. 90 capsule 1   No current facility-administered medications for this visit.    ALLERGIES:  No Known Allergies  PHYSICAL EXAM:  Performance status (ECOG): 1 - Symptomatic but completely ambulatory  There were no vitals filed for this visit. Wt Readings from Last 3 Encounters:  07/14/20 162 lb 3.2 oz (73.6 kg)  05/18/20 156 lb 3.2 oz (70.9 kg)  04/21/20 162 lb 12.8 oz (73.8 kg)   Physical Exam Neurological:     Mental Status: He is alert and oriented to person, place, and time.     LABORATORY DATA:  I have reviewed the labs as listed.  CBC Latest Ref Rng & Units 08/13/2020 04/14/2020 10/16/2019  WBC 4.0 - 10.5 K/uL 10.3 11.7(H) 9.3  Hemoglobin 13.0 - 17.0 g/dL 14.9 13.5 13.1  Hematocrit 39.0 - 52.0 % 48.9 44.7 43.5  Platelets 150 - 400 K/uL 205 186 203   CMP Latest Ref Rng & Units 08/13/2020 04/14/2020 02/18/2020  Glucose 70 - 99 mg/dL 92 112(H) 93  BUN 8 - 23 mg/dL 17 24(H) 19  Creatinine 0.61 - 1.24 mg/dL 1.66(H) 1.52(H) 1.51(H)  Sodium 135 - 145 mmol/L 140 141 142  Potassium 3.5 - 5.1 mmol/L 4.1 3.9 4.3  Chloride 98 - 111 mmol/L 105 108 106  CO2 22 - 32 mmol/L 26 25 26   Calcium 8.9 - 10.3 mg/dL 9.4 9.2 9.6  Total Protein 6.5  - 8.1 g/dL 6.8 6.8 6.4  Total Bilirubin 0.3 - 1.2 mg/dL 0.8 1.2 0.8  Alkaline Phos 38 - 126 U/L 49 41 -  AST 15 - 41 U/L 21 20 19   ALT 0 - 44 U/L 14 12 11       Component Value Date/Time   RBC 5.07 08/13/2020 1331   MCV 96.4 08/13/2020 1331   MCH 29.4 08/13/2020 1331   MCHC 30.5 08/13/2020 1331   RDW 13.7 08/13/2020 1331   LYMPHSABS 5.5 (H) 08/13/2020 1331   MONOABS 0.6 08/13/2020 1331   EOSABS 0.3 08/13/2020 1331   BASOSABS 0.1 08/13/2020 1331   Lab Results  Component Value Date   TIBC 298 08/13/2020   TIBC 309 04/14/2020   TIBC 263 10/16/2019   FERRITIN 210 08/13/2020   FERRITIN 199 04/14/2020   FERRITIN 343 (H) 10/16/2019   IRONPCTSAT 24 08/13/2020   IRONPCTSAT 28 04/14/2020   IRONPCTSAT 34 10/16/2019    DIAGNOSTIC IMAGING:  I have independently reviewed the scans and discussed with the patient. No results found.   ASSESSMENT:  1.  Stage I CLL: -Originally diagnosed in 2008, on watchful waiting since then. -No fevers, night sweats or weight loss.  No recurrent infections.  2.  DVT: -Diagnosed with DVT during hospitalization in December 2020.  IVC filter on 05/27/2019. -Anticoagulation was not started as he had GI bleed secondary to diverticulosis.  3.  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 -Venofer 5 doses from 07/23/2019 through 08/05/2019.  PLAN:  1.  Stage I CLL: -Denies any fevers, night sweats or weight loss.  No recurrent infections. -Labs from 08/13/2020 show white count of 10.3 and absolute lymphocyte count of 5500. -No reported adenopathy.    2.  DVT: -He is not on any anticoagulation at this time.  3.  Normocytic anemia: -Labs from 08/13/2020 show hemoglobin 14.9.  Ferritin is 210 with iron saturation of 24%. -No additional IV iron needed at this time.  4.  CKD: -His creatinine is between 1.4-1.5.  Latest creatinine is 1.66 -  He is not followed by a nephrologist at this time. -Recommend hydration. -We will send  referral for nephrology.   Disposition: -Referral to nephrology.  -RTC in 4 months with lab work and MD assessment.   I provided 8 minutes of non face-to-face telephone visit time during this encounter, and > 50% was spent counseling as documented under my assessment & plan.  Orders placed this encounter:   Orders Placed This Encounter  Procedures  . Ambulatory referral to Nephrology   Faythe Casa, NP 08/20/2020 10:32 AM  [place] [phone]

## 2020-08-21 ENCOUNTER — Encounter (HOSPITAL_COMMUNITY): Payer: Self-pay | Admitting: Lab

## 2020-08-21 NOTE — Progress Notes (Unsigned)
Referral to Dr Theador Hawthorne.  Records faxed on 3/18

## 2020-09-07 ENCOUNTER — Other Ambulatory Visit (INDEPENDENT_AMBULATORY_CARE_PROVIDER_SITE_OTHER): Payer: Self-pay | Admitting: Internal Medicine

## 2020-09-07 DIAGNOSIS — R413 Other amnesia: Secondary | ICD-10-CM

## 2020-09-08 ENCOUNTER — Other Ambulatory Visit (INDEPENDENT_AMBULATORY_CARE_PROVIDER_SITE_OTHER): Payer: Self-pay | Admitting: Internal Medicine

## 2020-09-09 ENCOUNTER — Other Ambulatory Visit (HOSPITAL_COMMUNITY): Payer: Self-pay | Admitting: Nephrology

## 2020-09-09 ENCOUNTER — Telehealth (INDEPENDENT_AMBULATORY_CARE_PROVIDER_SITE_OTHER): Payer: Self-pay

## 2020-09-09 ENCOUNTER — Other Ambulatory Visit: Payer: Self-pay | Admitting: Nephrology

## 2020-09-09 DIAGNOSIS — D472 Monoclonal gammopathy: Secondary | ICD-10-CM

## 2020-09-09 DIAGNOSIS — I129 Hypertensive chronic kidney disease with stage 1 through stage 4 chronic kidney disease, or unspecified chronic kidney disease: Secondary | ICD-10-CM | POA: Diagnosis not present

## 2020-09-09 DIAGNOSIS — N1832 Chronic kidney disease, stage 3b: Secondary | ICD-10-CM | POA: Diagnosis not present

## 2020-09-09 DIAGNOSIS — C9190 Lymphoid leukemia, unspecified not having achieved remission: Secondary | ICD-10-CM | POA: Diagnosis not present

## 2020-09-09 DIAGNOSIS — I5042 Chronic combined systolic (congestive) and diastolic (congestive) heart failure: Secondary | ICD-10-CM | POA: Diagnosis not present

## 2020-09-09 DIAGNOSIS — Z79899 Other long term (current) drug therapy: Secondary | ICD-10-CM | POA: Diagnosis not present

## 2020-09-09 DIAGNOSIS — C9110 Chronic lymphocytic leukemia of B-cell type not having achieved remission: Secondary | ICD-10-CM

## 2020-09-09 NOTE — Telephone Encounter (Signed)
I would recommend the patient come in and we check oxygen levels at rest and after exertion and then we can decide on removing oxygen or not.  Please make an appointment for the patient to be seen in the next week or so.

## 2020-09-09 NOTE — Telephone Encounter (Signed)
Called patient and LMOVM to return call  Left a detailed voice message for patients wife to call back to schedule an appointment for patient so we can document and get clearance letter to send to [facility].

## 2020-09-09 NOTE — Telephone Encounter (Signed)
Patient called and stated that she still needs for someone to come and pick up the oxygen for her husband. I see your telephone note in the chart from 07/06/2020 and not sure if you completed this?   Please call patient to advise. Thanks!

## 2020-09-10 NOTE — Telephone Encounter (Signed)
Called patients wife and gave her the information to schedule an appointment. [name] stated that they both talked about it and stated that they are going to hang on to the oxygen machine for a while just to make sure he does not need it and will let us know if he wants someone to come get the oxygen machine and equipment. Wife [name] verbalized an understanding.

## 2020-09-18 ENCOUNTER — Other Ambulatory Visit: Payer: Self-pay

## 2020-09-18 ENCOUNTER — Ambulatory Visit (HOSPITAL_COMMUNITY)
Admission: RE | Admit: 2020-09-18 | Discharge: 2020-09-18 | Disposition: A | Discharge status: Home / Self Care | Payer: Medicare HMO | Source: Ambulatory Visit | Attending: Nephrology | Admitting: Nephrology

## 2020-09-18 DIAGNOSIS — D472 Monoclonal gammopathy: Secondary | ICD-10-CM

## 2020-09-18 DIAGNOSIS — C9110 Chronic lymphocytic leukemia of B-cell type not having achieved remission: Secondary | ICD-10-CM

## 2020-09-18 DIAGNOSIS — I129 Hypertensive chronic kidney disease with stage 1 through stage 4 chronic kidney disease, or unspecified chronic kidney disease: Secondary | ICD-10-CM

## 2020-09-18 DIAGNOSIS — N1832 Chronic kidney disease, stage 3b: Secondary | ICD-10-CM | POA: Diagnosis not present

## 2020-10-14 DIAGNOSIS — D472 Monoclonal gammopathy: Secondary | ICD-10-CM | POA: Diagnosis not present

## 2020-10-14 DIAGNOSIS — I5042 Chronic combined systolic (congestive) and diastolic (congestive) heart failure: Secondary | ICD-10-CM | POA: Diagnosis not present

## 2020-10-14 DIAGNOSIS — I129 Hypertensive chronic kidney disease with stage 1 through stage 4 chronic kidney disease, or unspecified chronic kidney disease: Secondary | ICD-10-CM | POA: Diagnosis not present

## 2020-10-14 DIAGNOSIS — N1832 Chronic kidney disease, stage 3b: Secondary | ICD-10-CM | POA: Diagnosis not present

## 2020-10-14 DIAGNOSIS — R7303 Prediabetes: Secondary | ICD-10-CM | POA: Diagnosis not present

## 2020-11-05 ENCOUNTER — Other Ambulatory Visit (INDEPENDENT_AMBULATORY_CARE_PROVIDER_SITE_OTHER): Payer: Self-pay | Admitting: Nurse Practitioner

## 2020-11-05 ENCOUNTER — Other Ambulatory Visit: Payer: Self-pay | Admitting: Physician Assistant

## 2020-11-05 DIAGNOSIS — I214 Non-ST elevation (NSTEMI) myocardial infarction: Secondary | ICD-10-CM

## 2020-11-10 ENCOUNTER — Other Ambulatory Visit: Payer: Self-pay | Admitting: Physician Assistant

## 2020-11-10 ENCOUNTER — Telehealth (INDEPENDENT_AMBULATORY_CARE_PROVIDER_SITE_OTHER): Payer: Self-pay

## 2020-11-10 ENCOUNTER — Other Ambulatory Visit (INDEPENDENT_AMBULATORY_CARE_PROVIDER_SITE_OTHER): Payer: Self-pay | Admitting: Internal Medicine

## 2020-11-10 MED ORDER — MOLNUPIRAVIR EUA 200MG CAPSULE: 4.0000 | ORAL_CAPSULE | Freq: Two times a day (BID) | ORAL | 0 refills | Status: AC

## 2020-11-10 NOTE — Telephone Encounter (Signed)
Okay, [facility] [place]

## 2020-11-10 NOTE — Telephone Encounter (Signed)
Patient said yes please and [facility] [place] as [facility] will have someone pick this up.

## 2020-11-10 NOTE — Telephone Encounter (Signed)
Called patient and let him know that the medication has been sent to his pharmacy. Patient verbalized an understanding and will let us know how he is feeling on Monday. Patient thanked [place] also.

## 2020-11-10 NOTE — Telephone Encounter (Signed)
Let the patient know that since his symptoms started only in the last 24 hours, he is eligible for specific antiviral treatment for COVID-19 disease.  If you would like for me to prescribe this, then I can.  Let me know.

## 2020-11-10 NOTE — Telephone Encounter (Signed)
Patients wife called and stated that her and patient started having symptoms this morning and they both tested positive for Covid this morning. Patient spoke to me and stated that he has vomited several times and has a cough, diarrhea, and feeling very tired. Patient is asking if [facility] [place] to help him.  Please advise.

## 2020-11-16 ENCOUNTER — Other Ambulatory Visit (HOSPITAL_COMMUNITY): Payer: Self-pay

## 2020-11-16 DIAGNOSIS — D500 Iron deficiency anemia secondary to blood loss (chronic): Secondary | ICD-10-CM

## 2020-11-16 DIAGNOSIS — D649 Anemia, unspecified: Secondary | ICD-10-CM

## 2020-11-16 DIAGNOSIS — C9110 Chronic lymphocytic leukemia of B-cell type not having achieved remission: Secondary | ICD-10-CM

## 2020-11-18 ENCOUNTER — Other Ambulatory Visit: Payer: Self-pay

## 2020-11-18 ENCOUNTER — Ambulatory Visit (INDEPENDENT_AMBULATORY_CARE_PROVIDER_SITE_OTHER): Payer: Medicare HMO | Admitting: Nurse Practitioner

## 2020-11-18 ENCOUNTER — Encounter (INDEPENDENT_AMBULATORY_CARE_PROVIDER_SITE_OTHER): Payer: Self-pay | Admitting: Nurse Practitioner

## 2020-11-18 VITALS — BP 110/64 | HR 57 | Temp 96.4°F | Ht 69.0 in | Wt 158.4 lb

## 2020-11-18 DIAGNOSIS — N1832 Chronic kidney disease, stage 3b: Secondary | ICD-10-CM

## 2020-11-18 DIAGNOSIS — I10 Essential (primary) hypertension: Secondary | ICD-10-CM

## 2020-11-18 DIAGNOSIS — E785 Hyperlipidemia, unspecified: Secondary | ICD-10-CM | POA: Diagnosis not present

## 2020-11-18 DIAGNOSIS — I2510 Atherosclerotic heart disease of native coronary artery without angina pectoris: Secondary | ICD-10-CM

## 2020-11-18 LAB — LIPID PANEL
Cholesterol: 133 mg/dL (ref <200)
HDL: 28 mg/dL — ABNORMAL LOW (ref >=40)
LDL Cholesterol (Calc): 81 mg/dL (calc)
Non-HDL Cholesterol (Calc): 105 mg/dL (calc) (ref <130)
Total CHOL/HDL Ratio: 4.8 (calc) (ref <5.0)
Triglycerides: 140 mg/dL (ref <150)

## 2020-11-18 LAB — COMPLETE METABOLIC PANEL WITH GFR
AG Ratio: 2 (calc) (ref 1.0–2.5)
ALT: 9 U/L (ref 9–46)
AST: 17 U/L (ref 10–35)
Albumin: 4.2 g/dL (ref 3.6–5.1)
Alkaline phosphatase (APISO): 57 U/L (ref 35–144)
BUN/Creatinine Ratio: 11 (calc) (ref 6–22)
BUN: 18 mg/dL (ref 7–25)
CO2: 24 mmol/L (ref 20–32)
Calcium: 9.3 mg/dL (ref 8.6–10.3)
Chloride: 108 mmol/L (ref 98–110)
Creat: 1.59 mg/dL — ABNORMAL HIGH (ref 0.70–1.18)
GFR, Est African American: 48 mL/min/{1.73_m2} — ABNORMAL LOW (ref >=60)
GFR, Est Non African American: 42 mL/min/{1.73_m2} — ABNORMAL LOW (ref >=60)
Globulin: 2.1 g/dL (calc) (ref 1.9–3.7)
Glucose, Bld: 119 mg/dL (ref 65–139)
Potassium: 4.1 mmol/L (ref 3.5–5.3)
Sodium: 140 mmol/L (ref 135–146)
Total Bilirubin: 1 mg/dL (ref 0.2–1.2)
Total Protein: 6.3 g/dL (ref 6.1–8.1)

## 2020-11-18 NOTE — Progress Notes (Signed)
Subjective:  Patient ID: [name], male    DOB: Sep 03, 1945  Age: 75 y.o. MRN: [id number]  CC:  Chief Complaint  Patient presents with   Follow-up    Recently positive for Covid, feeling better, no concerns   Hypertension   Coronary Artery Disease   Hyperlipidemia   Chronic Kidney Disease      HPI  This patient arrives today for the above.  Hypertension/CAD: He continues on his antihypertensives including lisinopril and furosemide.  He also continues on Plavix.  He reports tolerating medication well.  He denies any recent chest pain.  Hyperlipidemia: He is on atorvastatin 40 mg daily.  Last LDL was collected a little over a year ago and it was 77.  CKD 3B: He continues to follow with Dr. Theador Hawthorne for management of his chronic kidney disease.  He reports feeling well and has no complaints today.  Past Medical History:  Diagnosis Date   Acute MI, inferolateral wall, initial episode of care ([place]) 06/16/15   99% dCx    BPH (benign prostatic hyperplasia) 04/08/2011   CAD S/P percutaneous coronary angioplasty 11/23/12; 06/16/15   a. NSTEMI 2014 with 2 overlapping mRCA lesions - Xience Xpedition DES 2.75 mm x 18 mm (3.0 mm). b. Inf-Lat STEMI 06/16/2015 - dCx Asp Thrombectomy & PCI 3.0 x 15 Xience drDES (3.6 mm).   Chronic combined systolic and diastolic CHF (congestive heart failure) ([facility])    Chronic headache    CKD (chronic kidney disease), stage III ([facility])    CLL (chronic lymphocytic leukemia) ([place]) 04/08/2011   Depression    Diverticulitis    Gallstone    GERD (gastroesophageal reflux disease)    Hypercholesteremia    Ischemic cardiomyopathy    Echo 06/18/2015 EF 35-40% after lateral MI -> repeat echo 08/06/2015: EF 40-45% with basal-mid inferolateral akinesis and hypokinesis of the lateral wall.   NSTEMI (non-ST elevated myocardial infarction) ([place]) 11/23/12   NSTEMI 11/23/2012 RCA lesion - PCI with DES; moderate LAD disease   PTSD (post-traumatic stress disorder)     With Depression - since MI      Family History  Problem Relation Age of Onset   Diabetes Mother    Cancer Father        prostate   Colon cancer Neg Hx     Social History   Social History Narrative   Exercises 3 to 4 days a week at the fitness center for about a half an hour at a time.   "former smoker". Does not drink EtOH   Married.   Social History   Tobacco Use   Smoking status: Former    Packs/day: 0.25    Years: 2.00    Pack years: 0.50    Types: Cigarettes    Quit date: 06/07/1963    Years since quitting: 57.4   Smokeless tobacco: Never  Substance Use Topics   Alcohol use: No    Alcohol/week: 0.0 standard drinks    Comment: Occasionally drinks red wine     Current Meds  Medication Sig   atorvastatin (LIPITOR) 40 MG tablet Take 1 tablet (40 mg total) by mouth daily.   clopidogrel (PLAVIX) 75 MG tablet TAKE 1 TABLET BY MOUTH EVERY DAY   [facility] VITAMIN B12 1000 MCG TBCR TAKE 1 TABLET BY MOUTH EVERY DAY   furosemide (LASIX) 20 MG tablet TAKE 1 TABLET BY MOUTH EVERY DAY   lisinopril (ZESTRIL) 2.5 MG tablet Take 1 tablet (2.5 mg total) by mouth daily.  ROS:  Review of Systems  Eyes:  Negative for blurred vision.  Respiratory:  Negative for shortness of breath.   Cardiovascular:  Negative for chest pain.  Gastrointestinal:  Negative for abdominal pain and blood in stool.  Neurological:  Negative for dizziness and headaches.    Objective:   Today's Vitals: BP 110/64   Pulse (!) 57   Temp (!) 96.4 F (35.8 C) (Temporal)   Ht 5' 9"  (1.753 m)   Wt 158 lb 6.4 oz (71.8 kg)   SpO2 94%   BMI 23.39 kg/m  Vitals with BMI 11/18/2020 07/14/2020 05/18/2020  Height 5' 9"  5' 9"  5' 9"   Weight 158 lbs 6 oz 162 lbs 3 oz 156 lbs 3 oz  BMI 23.38 23.95 32.02  Systolic 334 356 861  Diastolic 64 70 76  Pulse 57 65 65     Physical Exam Vitals reviewed.  Constitutional:      Appearance: Normal appearance.  HENT:     Head: Normocephalic and atraumatic.   Cardiovascular:     Rate and Rhythm: Normal rate and regular rhythm.  Pulmonary:     Effort: Pulmonary effort is normal.     Breath sounds: Normal breath sounds.  Musculoskeletal:     Cervical back: Neck supple.  Skin:    General: Skin is warm and dry.  Neurological:     Mental Status: He is alert and oriented to person, place, and time.  Psychiatric:        Mood and Affect: Mood normal.        Behavior: Behavior normal.        Thought Content: Thought content normal.        Judgment: Judgment normal.         Assessment and Plan   1. Hyperlipidemia, unspecified hyperlipidemia type   2. Coronary artery disease involving native coronary artery of native heart without angina pectoris   3. Essential hypertension   4. Stage 3b chronic kidney disease ([place])      Plan: 1. we will check CMP and lipid panel today.  For now he will continue on his statin as currently prescribed. 2.,  3.  Appears to be stable currently, he will continue on his antihypertensives and Plavix. 4.  He will continue to follow-up with Dr. Theador Hawthorne as scheduled.  We will make note of kidney function on CMP collected today.   Tests ordered Orders Placed This Encounter  Procedures   Lipid Panel   CMP with eGFR([facility])      No orders of the defined types were placed in this encounter.   Patient to follow-up in 3 months or sooner as needed.  Ailene Ards, NP

## 2020-11-19 ENCOUNTER — Other Ambulatory Visit (INDEPENDENT_AMBULATORY_CARE_PROVIDER_SITE_OTHER): Payer: Self-pay | Admitting: Nurse Practitioner

## 2020-11-19 DIAGNOSIS — E785 Hyperlipidemia, unspecified: Secondary | ICD-10-CM

## 2020-11-19 MED ORDER — ATORVASTATIN CALCIUM 40 MG PO TABS: 40.0000 mg | ORAL_TABLET | Freq: Every day | ORAL | 1 refills | Status: DC

## 2020-12-09 ENCOUNTER — Telehealth (INDEPENDENT_AMBULATORY_CARE_PROVIDER_SITE_OTHER): Payer: Self-pay | Admitting: Nurse Practitioner

## 2020-12-09 ENCOUNTER — Ambulatory Visit (INDEPENDENT_AMBULATORY_CARE_PROVIDER_SITE_OTHER): Payer: Medicare HMO | Admitting: Nurse Practitioner

## 2020-12-09 ENCOUNTER — Other Ambulatory Visit: Payer: Self-pay

## 2020-12-09 ENCOUNTER — Encounter (INDEPENDENT_AMBULATORY_CARE_PROVIDER_SITE_OTHER): Payer: Self-pay | Admitting: Nurse Practitioner

## 2020-12-09 VITALS — BP 110/64 | HR 54 | Temp 97.0°F | Ht 65.0 in | Wt 159.8 lb

## 2020-12-09 DIAGNOSIS — R413 Other amnesia: Secondary | ICD-10-CM

## 2020-12-09 DIAGNOSIS — Z0000 Encounter for general adult medical examination without abnormal findings: Secondary | ICD-10-CM

## 2020-12-09 NOTE — Patient Instructions (Signed)
  [name] , Thank you for taking time to come for your Medicare Wellness Visit. I appreciate your ongoing commitment to your health goals. Please review the following plan we discussed and let me know if I can assist you in the future.   These are the goals we discussed:  Goals      Prevent falls        This is a list of the screening recommended for you and due dates:  Health Maintenance  Topic Date Due   Zoster (Shingles) Vaccine (1 of 2) Never done   COVID-19 Vaccine (4 - Booster for Moderna series) 08/19/2020   Tetanus Vaccine  11/18/2021*   Flu Shot  01/04/2021   Hepatitis C Screening: USPSTF Recommendation to screen - Ages 18-79 yo.  Completed   Pneumonia vaccines  Completed   HPV Vaccine  Aged Out  *Topic was postponed. The date shown is not the original due date.

## 2020-12-09 NOTE — Progress Notes (Signed)
Subjective:   [name] is a 75 y.o. male who presents for Medicare Annual/Subsequent preventive examination.  Review of Systems     Cardiac Risk Factors include: dyslipidemia;advanced age (>50men, >44 women);hypertension;male gender     Objective:    Today's Vitals   12/09/20 1549  BP: 110/64  Pulse: (!) 54  Temp: (!) 97 F (36.1 C)  TempSrc: Temporal  SpO2: 98%  Weight: 159 lb 12.8 oz (72.5 kg)  Height: 5\' 5"  (1.651 m)   Body mass index is 26.59 kg/m.  Advanced Directives 12/09/2020 04/21/2020 11/29/2019 11/06/2019 10/23/2019 08/05/2019 08/02/2019  Does Patient Have a Medical Advance Directive? No No Yes No No No No  Does patient want to make changes to medical advance directive? - - No - Patient declined - - - -  Would patient like information on creating a medical advance directive? Yes (MAU/Ambulatory/Procedural Areas - Information given) No - Patient declined - No - Patient declined No - Patient declined No - Patient declined No - Patient declined  Pre-existing out of facility DNR order (yellow form or pink MOST form) - - - - - - -    Current Medications (verified) Outpatient Encounter Medications as of 12/09/2020  Medication Sig   atorvastatin (LIPITOR) 40 MG tablet Take 1 tablet (40 mg total) by mouth daily.   clopidogrel (PLAVIX) 75 MG tablet TAKE 1 TABLET BY MOUTH EVERY DAY   [employer] VITAMIN B12 1000 MCG TBCR TAKE 1 TABLET BY MOUTH EVERY DAY   furosemide (LASIX) 20 MG tablet TAKE 1 TABLET BY MOUTH EVERY DAY   lisinopril (ZESTRIL) 2.5 MG tablet Take 1 tablet (2.5 mg total) by mouth daily.   [DISCONTINUED] nitroGLYCERIN (NITROSTAT) 0.4 MG SL tablet Place 1 tablet (0.4 mg total) under the tongue every 5 (five) minutes as needed for chest pain. (Patient not taking: Reported on 05/14/2018)   No facility-administered encounter medications on file as of 12/09/2020.    Allergies (verified) Patient has no known allergies.   History: Past Medical History:  Diagnosis Date    Acute MI, inferolateral wall, initial episode of care ([place]) 06/16/15   99% dCx    BPH (benign prostatic hyperplasia) 04/08/2011   CAD S/P percutaneous coronary angioplasty 11/23/12; 06/16/15   a. NSTEMI 2014 with 2 overlapping mRCA lesions - Xience Xpedition DES 2.75 mm x 18 mm (3.0 mm). b. Inf-Lat STEMI 06/16/2015 - dCx Asp Thrombectomy & PCI 3.0 x 15 Xience drDES (3.6 mm).   Chronic combined systolic and diastolic CHF (congestive heart failure) (HCC)    Chronic headache    CKD (chronic kidney disease), stage III (HCC)    CLL (chronic lymphocytic leukemia) ([place]) 04/08/2011   Depression    Diverticulitis    Gallstone    GERD (gastroesophageal reflux disease)    Hypercholesteremia    Ischemic cardiomyopathy    Echo 06/18/2015 EF 35-40% after lateral MI -> repeat echo 08/06/2015: EF 40-45% with basal-mid inferolateral akinesis and hypokinesis of the lateral wall.   NSTEMI (non-ST elevated myocardial infarction) ([place]) 11/23/12   NSTEMI 11/23/2012 RCA lesion - PCI with DES; moderate LAD disease   PTSD (post-traumatic stress disorder)    With Depression - since MI   Past Surgical History:  Procedure Laterality Date   APPENDECTOMY  1968-70   CARDIAC CATHETERIZATION N/A 06/16/2015   Procedure: Left Heart Cath and Coronary Angiography;  Surgeon: Jettie Booze, MD;  Location: [facility];  Service: Cardiovascular: 99% thrombotic dCx -> asp thrombectomy & PCI. RCA  Stents Patent.    CARDIAC CATHETERIZATION N/A 06/16/2015   Procedure: Coronary Stent Intervention;  Surgeon: Jettie Booze, MD;  Location: [facility];  Service: Cardiovascular: PCI dCx = thrombectomy -> 3.0 x 15 Xience drug-eluting stent (3.6 mm)    COLONOSCOPY  2008   TMH:[id number] diverticula, diminutive polyp in the cecum, s/p bx Normal rectum. Tubular adenoma   COLONOSCOPY N/A 10/17/2012   Dr. Gala Romney: colonic diverticulosis, tubular adenoma, surveillance due May 2019   COLONOSCOPY N/A 02/21/2018   Procedure:  COLONOSCOPY;  Surgeon: Daneil Dolin, MD;  [facility];  Service: Endoscopy;  Laterality: N/A;  12:00   CORONARY STENT PLACEMENT  11/23/12   Mid RCA -- Xience eXp - 2.75 mm x 18 mm DES (3.37mm) , Dr. Ellyn Hack   IR IVC FILTER PLMT / S&I Burke Keels GUID/MOD SED  05/27/2019   IR RADIOLOGIST EVAL & MGMT  09/11/2019   LEFT HEART CATHETERIZATION WITH CORONARY ANGIOGRAM N/A 11/23/2012   Procedure: LEFT HEART CATHETERIZATION WITH CORONARY ANGIOGRAM;  Surgeon: Leonie Man, MD;  Location: [facility];  Service: Cardiovascular: NSTEMI: Culprit = mid RCA 95% & 75%; LAD ~40-50%, Small RI ~60%; EF ~60%, mild inf-basal HK      TRANSTHORACIC ECHOCARDIOGRAM  January 2017; March 2017   a. EF 35-40%. Entire inferior hypokinesis and akinesis of the basal and mid inferolateral and anterolateral /distal lateral walls;; b. EF 40 and 45%. Akinesis of basal-mid inferolateral wall. Hypokinesis of entire lateral wall.   Family History  Problem Relation Age of Onset   Diabetes Mother    Cancer Father        prostate   Colon cancer Neg Hx    Social History   Socioeconomic History   Marital status: Married    Spouse name: Not on file   Number of children: Not on file   Years of education: Not on file   Highest education level: Not on file  Occupational History   Not on file  Tobacco Use   Smoking status: Former    Packs/day: 0.25    Years: 2.00    Pack years: 0.50    Types: Cigarettes    Quit date: 06/07/1963    Years since quitting: 57.5   Smokeless tobacco: Never  Vaping Use   Vaping Use: Never used  Substance and Sexual Activity   Alcohol use: No    Alcohol/week: 0.0 standard drinks    Comment: Occasionally drinks red wine   Drug use: No   Sexual activity: Not on file  Other Topics Concern   Not on file  Social History Narrative   Exercises 3 to 4 days a week at the fitness center for about a half an hour at a time.   "former smoker". Does not drink EtOH   Married.   Social Determinants  of Health   Financial Resource Strain: Not on file  Food Insecurity: Not on file  Transportation Needs: Not on file  Physical Activity: Not on file  Stress: Not on file  Social Connections: Not on file    Tobacco Counseling Counseling given: Yes   Clinical Intake:  Pre-visit preparation completed: Yes  Pain : No/denies pain     BMI - recorded: 26.59 Nutritional Status: BMI 25 -29 Overweight Nutritional Risks: None Diabetes: No  How often do you need to have someone help you when you read instructions, pamphlets, or other written materials from your doctor or pharmacy?: 2 - Rarely What is the last grade  level you completed in school?: 12th grade  Diabetic?No  Interpreter Needed?: No  Information entered by :: Jeralyn Ruths, NP-C   Activities of Daily Living In your present state of health, do you have any difficulty performing the following activities: 12/09/2020  Hearing? N  Vision? N  Difficulty concentrating or making decisions? N  Walking or climbing stairs? N  Dressing or bathing? N  Doing errands, shopping? N  Preparing Food and eating ? N  Using the Toilet? N  In the past six months, have you accidently leaked urine? N  Do you have problems with loss of bowel control? N  Managing your Medications? Y  Managing your Finances? Y  Comment Wife assists him  Housekeeping or managing your Housekeeping? N  Some recent data might be hidden    Patient Care Team: [name], MD as PCP - General (Internal Medicine) Leonie Man, MD as PCP - Cardiology (Cardiology) Gala Romney Cristopher Estimable, MD as Attending Physician (Gastroenterology) Derek Jack, MD as Consulting Physician (Hematology and Oncology)  Indicate any recent Medical Services you may have received from other than [facility] providers in the past year (date may be approximate).     Assessment:   This is a routine wellness examination for Izell.  Hearing/Vision screen No results found.  Dietary  issues and exercise activities discussed: Current Exercise Habits: Home exercise routine, Type of exercise: walking, Time (Minutes): 30, Frequency (Times/Week): 3, Weekly Exercise (Minutes/Week): 90, Intensity: Moderate, Exercise limited by: cardiac condition(s);orthopedic condition(s)   Goals Addressed   None    Depression Screen PHQ 2/9 Scores 12/09/2020 11/18/2020 05/18/2020 11/29/2019 08/19/2019 03/21/2014 12/14/2012  PHQ - 2 Score 0 0 0 0 0 0 0  PHQ- 9 Score 0 0 0 - - - -  Exception Documentation - - - - Medical reason - -    Fall Risk Fall Risk  12/09/2020 11/18/2020 05/18/2020 11/29/2019 08/19/2019  Falls in the past year? 0 0 0 1 0  Number falls in past yr: - - - 1 0  Injury with Fall? - - - 0 0  Risk for fall due to : - - - - No Fall Risks  Follow up - - - Education provided;Falls prevention discussed;Falls evaluation completed Falls evaluation completed    FALL RISK PREVENTION PERTAINING TO THE HOME:  Any stairs in or around the home? No  If so, are there any without handrails?  N/A Home free of loose throw rugs in walkways, pet beds, electrical cords, etc? Yes  Adequate lighting in your home to reduce risk of falls? Yes   ASSISTIVE DEVICES UTILIZED TO PREVENT FALLS:  Life alert? Yes  - has it available to use if necessary does not use it routinely Use of a cane, walker or w/c? Yes  Grab bars in the bathroom? No  Shower chair or bench in shower? No  Elevated toilet seat or a handicapped toilet? No   TIMED UP AND GO:  Was the test performed? Yes .  Length of time to ambulate 10 feet: 20 sec.   Gait steady and fast without use of assistive device  Cognitive Function:     6CIT Screen 12/09/2020 11/29/2019  What Year? 4 points 0 points  What month? 0 points 0 points  What time? 3 points 0 points  Count back from 20 0 points 0 points  Months in reverse 4 points 0 points  Repeat phrase 6 points 0 points  Total Score 17 0    Immunizations Immunization  History   Administered Date(s) Administered   Fluad Quad(high Dose 65+) 02/22/2019   Influenza, High Dose Seasonal PF 01/30/2018   Influenza,inj,Quad PF,6+ Mos 03/21/2014   Influenza-Unspecified 02/22/2019, 01/28/2020   Moderna Sars-Covid-2 Vaccination 08/19/2019, 09/16/2019   Pneumococcal Conjugate-13 03/19/2018   Pneumococcal Polysaccharide-23 02/18/2020    TDAP status: Due, Education has been provided regarding the importance of this vaccine. Advised may receive this vaccine at local pharmacy or Health Dept. Aware to provide a copy of the vaccination record if obtained from local pharmacy or Health Dept. Verbalized acceptance and understanding.  Flu Vaccine status: Up to date  Pneumococcal vaccine status: Up to date  Covid-19 vaccine status: Completed vaccines  Qualifies for Shingles Vaccine? Yes   Zostavax completed No   Shingrix Completed?: No.    Education has been provided regarding the importance of this vaccine. Patient has been advised to call insurance company to determine out of pocket expense if they have not yet received this vaccine. Advised may also receive vaccine at local pharmacy or Health Dept. Verbalized acceptance and understanding.  Screening Tests Health Maintenance  Topic Date Due   Zoster Vaccines- Shingrix (1 of 2) Never done   COVID-19 Vaccine (3 - Moderna risk series) 10/14/2019   TETANUS/TDAP  11/18/2021 (Originally 03/29/1965)   INFLUENZA VACCINE  01/04/2021   Hepatitis C Screening  Completed   PNA vac Low Risk Adult  Completed   HPV VACCINES  Aged Out    Health Maintenance  Health Maintenance Due  Topic Date Due   Zoster Vaccines- Shingrix (1 of 2) Never done   COVID-19 Vaccine (3 - Moderna risk series) 10/14/2019    Colorectal cancer screening: No longer required.   Lung Cancer Screening: (Low Dose CT Chest recommended if Age 48-80 years, 30 pack-year currently smoking OR have quit w/in 15years.) does not qualify.   Lung Cancer Screening  Referral: N/A  Additional Screening:  Hepatitis C Screening: does not qualify; Completed 02/2020  Vision Screening: Recommended annual ophthalmology exams for early detection of glaucoma and other disorders of the eye. Is the patient up to date with their annual eye exam?  Yes  Who is the provider or what is the name of the office in which the patient attends annual eye exams? [facility]  If pt is not established with a provider, would they like to be referred to a provider to establish care? No .   Dental Screening: Recommended annual dental exams for proper oral hygiene  Community Resource Referral / Chronic Care Management: CRR required this visit?  No   CCM required this visit?  No      Plan:    Will refer patient to neurology for further evaluation of his cognitive deficits.  He is up-to-date with most vaccinations encouraged him to consider getting a second booster of the COVID-19 vaccine and get a tetanus booster.  He and his wife consider this.  We had a conversation regarding advance care planning today and they were given a copy of a MOST form to bring home and review together if they decide to fill this out we can do this at a subsequent visit.  I have personally reviewed and noted the following in the patient's chart:   Medical and social history Use of alcohol, tobacco or illicit drugs  Current medications and supplements including opioid prescriptions. Patient is not currently taking opioid prescriptions. Functional ability and status Nutritional status Physical activity Advanced directives List of other physicians Hospitalizations, surgeries, and ER  visits in previous 12 months Vitals Screenings to include cognitive, depression, and falls Referrals and appointments  In addition, I have reviewed and discussed with patient certain preventive protocols, quality metrics, and best practice recommendations. A written personalized care plan for preventive services as  well as general preventive health recommendations were provided to patient.    Patient will follow up as scheduled in September and will have annual wellness visit in 1 year.  Ailene Ards, NP   12/09/2020

## 2020-12-09 NOTE — Telephone Encounter (Signed)
Sent to Dr Merlene Laughter

## 2020-12-09 NOTE — Telephone Encounter (Signed)
Just FYI I ordered referral to neurology for this patient today due to his positive cognitive screening test.  He had a high number on his 6CIT test today.  They would prefer to stay in the area so please refer him to Dr. Merlene Laughter.  Thank you.

## 2020-12-16 ENCOUNTER — Other Ambulatory Visit: Payer: Self-pay

## 2020-12-16 ENCOUNTER — Inpatient Hospital Stay (HOSPITAL_COMMUNITY): Payer: Medicare HMO | Attending: Hematology

## 2020-12-16 DIAGNOSIS — I252 Old myocardial infarction: Secondary | ICD-10-CM | POA: Diagnosis not present

## 2020-12-16 DIAGNOSIS — C9110 Chronic lymphocytic leukemia of B-cell type not having achieved remission: Secondary | ICD-10-CM | POA: Diagnosis not present

## 2020-12-16 DIAGNOSIS — D500 Iron deficiency anemia secondary to blood loss (chronic): Secondary | ICD-10-CM

## 2020-12-16 DIAGNOSIS — E7800 Pure hypercholesterolemia, unspecified: Secondary | ICD-10-CM | POA: Insufficient documentation

## 2020-12-16 DIAGNOSIS — I5042 Chronic combined systolic (congestive) and diastolic (congestive) heart failure: Secondary | ICD-10-CM | POA: Diagnosis not present

## 2020-12-16 DIAGNOSIS — N400 Enlarged prostate without lower urinary tract symptoms: Secondary | ICD-10-CM | POA: Diagnosis not present

## 2020-12-16 DIAGNOSIS — D649 Anemia, unspecified: Secondary | ICD-10-CM

## 2020-12-16 DIAGNOSIS — Z79899 Other long term (current) drug therapy: Secondary | ICD-10-CM | POA: Diagnosis not present

## 2020-12-16 DIAGNOSIS — D509 Iron deficiency anemia, unspecified: Secondary | ICD-10-CM | POA: Insufficient documentation

## 2020-12-16 DIAGNOSIS — N183 Chronic kidney disease, stage 3 unspecified: Secondary | ICD-10-CM | POA: Insufficient documentation

## 2020-12-16 DIAGNOSIS — Z87891 Personal history of nicotine dependence: Secondary | ICD-10-CM | POA: Diagnosis not present

## 2020-12-16 LAB — CBC WITH DIFFERENTIAL/PLATELET
Abs Immature Granulocytes: 0.04 10*3/uL (ref 0.00–0.07)
Basophils Absolute: 0.1 10*3/uL (ref 0.0–0.1)
Basophils Relative: 1 %
Eosinophils Absolute: 0.2 10*3/uL (ref 0.0–0.5)
Eosinophils Relative: 1 %
HCT: 45.8 % (ref 39.0–52.0)
Hemoglobin: 14.5 g/dL (ref 13.0–17.0)
Immature Granulocytes: 0 %
Lymphocytes Relative: 57 %
Lymphs Abs: 7.6 10*3/uL — ABNORMAL HIGH (ref 0.7–4.0)
MCH: 30 pg (ref 26.0–34.0)
MCHC: 31.7 g/dL (ref 30.0–36.0)
MCV: 94.8 fL (ref 80.0–100.0)
Monocytes Absolute: 0.6 10*3/uL (ref 0.1–1.0)
Monocytes Relative: 5 %
Neutro Abs: 4.8 10*3/uL (ref 1.7–7.7)
Neutrophils Relative %: 36 %
Platelets: 199 10*3/uL (ref 150–400)
RBC: 4.83 MIL/uL (ref 4.22–5.81)
RDW: 14 % (ref 11.5–15.5)
WBC: 13.3 10*3/uL — ABNORMAL HIGH (ref 4.0–10.5)
nRBC: 0 % (ref 0.0–0.2)

## 2020-12-16 LAB — COMPREHENSIVE METABOLIC PANEL
ALT: 15 U/L (ref 0–44)
AST: 22 U/L (ref 15–41)
Albumin: 4 g/dL (ref 3.5–5.0)
Alkaline Phosphatase: 53 U/L (ref 38–126)
Anion gap: 8 (ref 5–15)
BUN: 23 mg/dL (ref 8–23)
CO2: 24 mmol/L (ref 22–32)
Calcium: 9.2 mg/dL (ref 8.9–10.3)
Chloride: 106 mmol/L (ref 98–111)
Creatinine, Ser: 1.53 mg/dL — ABNORMAL HIGH (ref 0.61–1.24)
GFR, Estimated: 47 mL/min — ABNORMAL LOW (ref >60)
Glucose, Bld: 149 mg/dL — ABNORMAL HIGH (ref 70–99)
Potassium: 3.9 mmol/L (ref 3.5–5.1)
Sodium: 138 mmol/L (ref 135–145)
Total Bilirubin: 1.6 mg/dL — ABNORMAL HIGH (ref 0.3–1.2)
Total Protein: 6.5 g/dL (ref 6.5–8.1)

## 2020-12-16 LAB — IRON AND TIBC
Iron: 105 ug/dL (ref 45–182)
Saturation Ratios: 35 % (ref 17.9–39.5)
TIBC: 298 ug/dL (ref 250–450)
UIBC: 193 ug/dL

## 2020-12-16 LAB — LACTATE DEHYDROGENASE: LDH: 119 U/L (ref 98–192)

## 2020-12-16 LAB — FERRITIN: Ferritin: 198 ng/mL (ref 24–336)

## 2020-12-18 ENCOUNTER — Other Ambulatory Visit: Payer: Self-pay | Admitting: Physician Assistant

## 2020-12-18 NOTE — Telephone Encounter (Signed)
This is Dr. Harding's pt. °

## 2020-12-23 ENCOUNTER — Inpatient Hospital Stay (HOSPITAL_COMMUNITY): Payer: Medicare HMO | Admitting: Physician Assistant

## 2020-12-23 ENCOUNTER — Other Ambulatory Visit: Payer: Self-pay

## 2020-12-23 VITALS — BP 117/59 | HR 56 | Temp 97.6°F | Resp 16 | Wt 153.0 lb

## 2020-12-23 DIAGNOSIS — D649 Anemia, unspecified: Secondary | ICD-10-CM | POA: Diagnosis not present

## 2020-12-23 DIAGNOSIS — C9110 Chronic lymphocytic leukemia of B-cell type not having achieved remission: Secondary | ICD-10-CM | POA: Diagnosis not present

## 2020-12-23 NOTE — Progress Notes (Signed)
[address], [place] [address]   CLINIC:  Medical Oncology/Hematology  PCP:  [name], MD [place] [address] [phone]   REASON FOR VISIT:  Follow-up for CLL, anemia, and history of DVT  PRIOR THERAPY: None  CURRENT THERAPY: Observation and intermittent Venofer  INTERVAL HISTORY:  [name] 75 y.o. male returns for routine follow-up of his CLL, anemia, and prior DVT.  He was last evaluated by NP [name] on 08/20/2020 via telemedicine visit.  At today's visit, he reports feeling well.  No recent hospitalizations, surgeries, or changes in baseline health status.  He denies any new lumps or bumps, no fever, chills, night sweats, unintentional weight loss.  He has not had any extreme fatigue or frequent infections.  He denies symptoms of hyperviscosity such as headache, dizziness, tinnitus, or vision changes.  No early satiety or abdominal pain.  He denies any signs or symptoms of blood loss.  No chest pain, shortness of breath, syncope, palpitations.  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  No symptoms of pulmonary embolism such as dyspnea, chest pain, or hemoptysis.  He has 100% energy and 100% appetite. He endorses that he is maintaining a stable weight.  Review of weights over the past year shows that he has been up and down within the same 10 pound weight range.    REVIEW OF SYSTEMS:  Review of Systems  Constitutional:  Negative for appetite change, chills, diaphoresis, fatigue, fever and unexpected weight change.  HENT:   Negative for lump/mass and nosebleeds.   Eyes:  Negative for eye problems.  Respiratory:  Negative for cough, hemoptysis and shortness of breath.   Cardiovascular:  Positive for leg swelling. Negative for chest pain and palpitations.  Gastrointestinal:  Negative for  abdominal pain, blood in stool, constipation, diarrhea, nausea and vomiting.  Genitourinary:  Negative for hematuria.   Skin: Negative.   Neurological:  Negative for dizziness, headaches and light-headedness.  Hematological:  Does not bruise/bleed easily.     PAST MEDICAL/SURGICAL HISTORY:  Past Medical History:  Diagnosis Date   Acute MI, inferolateral wall, initial episode of care ([place]) 06/16/15   99% dCx    BPH (benign prostatic hyperplasia) 04/08/2011   CAD S/P percutaneous coronary angioplasty 11/23/12; 06/16/15   a. NSTEMI 2014 with 2 overlapping mRCA lesions - Xience Xpedition DES 2.75 mm x 18 mm (3.0 mm). b. Inf-Lat STEMI 06/16/2015 - dCx Asp Thrombectomy & PCI 3.0 x 15 Xience drDES (3.6 mm).   Chronic combined systolic and diastolic CHF (congestive heart failure) (HCC)    Chronic headache    CKD (chronic kidney disease), stage III (HCC)    CLL (chronic lymphocytic leukemia) ([place]) 04/08/2011   Depression    Diverticulitis    Gallstone    GERD (gastroesophageal reflux disease)    Hypercholesteremia    Ischemic cardiomyopathy    Echo 06/18/2015 EF 35-40% after lateral MI -> repeat echo 08/06/2015: EF 40-45% with basal-mid inferolateral akinesis and hypokinesis of the lateral wall.   NSTEMI (non-ST elevated myocardial infarction) ([place]) 11/23/12   NSTEMI 11/23/2012 RCA lesion - PCI with DES; moderate LAD disease   PTSD (post-traumatic stress disorder)    With Depression - since MI   Past Surgical History:  Procedure Laterality Date   APPENDECTOMY  1968-70   CARDIAC CATHETERIZATION N/A 06/16/2015  Procedure: Left Heart Cath and Coronary Angiography;  Surgeon: Jettie Booze, MD;  Location: [facility];  Service: Cardiovascular: 99% thrombotic dCx -> asp thrombectomy & PCI. RCA Stents Patent.    CARDIAC CATHETERIZATION N/A 06/16/2015   Procedure: Coronary Stent Intervention;  Surgeon: Jettie Booze, MD;  Location: [facility];  Service: Cardiovascular: PCI dCx =  thrombectomy -> 3.0 x 15 Xience drug-eluting stent (3.6 mm)    COLONOSCOPY  2008   RJJ:[id number]-YSAYT diverticula, diminutive polyp in the cecum, s/p bx Normal rectum. Tubular adenoma   COLONOSCOPY N/A 10/17/2012   Dr. Gala Romney: colonic diverticulosis, tubular adenoma, surveillance due May 2019   COLONOSCOPY N/A 02/21/2018   Procedure: COLONOSCOPY;  Surgeon: Daneil Dolin, MD;  [facility];  Service: Endoscopy;  Laterality: N/A;  12:00   CORONARY STENT PLACEMENT  11/23/12   Mid RCA -- Xience eXp - 2.75 mm x 18 mm DES (3.98mm) , Dr. Ellyn Hack   IR IVC FILTER PLMT / S&I Burke Keels GUID/MOD SED  05/27/2019   IR RADIOLOGIST EVAL & MGMT  09/11/2019   LEFT HEART CATHETERIZATION WITH CORONARY ANGIOGRAM N/A 11/23/2012   Procedure: LEFT HEART CATHETERIZATION WITH CORONARY ANGIOGRAM;  Surgeon: Leonie Man, MD;  Location: [facility];  Service: Cardiovascular: NSTEMI: Culprit = mid RCA 95% & 75%; LAD ~40-50%, Small RI ~60%; EF ~60%, mild inf-basal HK      TRANSTHORACIC ECHOCARDIOGRAM  January 2017; March 2017   a. EF 35-40%. Entire inferior hypokinesis and akinesis of the basal and mid inferolateral and anterolateral /distal lateral walls;; b. EF 40 and 45%. Akinesis of basal-mid inferolateral wall. Hypokinesis of entire lateral wall.     SOCIAL HISTORY:  Social History   Socioeconomic History   Marital status: Married    Spouse name: Not on file   Number of children: Not on file   Years of education: Not on file   Highest education level: Not on file  Occupational History   Not on file  Tobacco Use   Smoking status: Former    Packs/day: 0.25    Years: 2.00    Pack years: 0.50    Types: Cigarettes    Quit date: 06/07/1963    Years since quitting: 57.5   Smokeless tobacco: Never  Vaping Use   Vaping Use: Never used  Substance and Sexual Activity   Alcohol use: No    Alcohol/week: 0.0 standard drinks    Comment: Occasionally drinks red wine   Drug use: No   Sexual activity: Not on file   Other Topics Concern   Not on file  Social History Narrative   Exercises 3 to 4 days a week at the fitness center for about a half an hour at a time.   "former smoker". Does not drink EtOH   Married.   Social Determinants of Health   Financial Resource Strain: Not on file  Food Insecurity: Not on file  Transportation Needs: Not on file  Physical Activity: Not on file  Stress: Not on file  Social Connections: Not on file  Intimate Partner Violence: Not on file    FAMILY HISTORY:  Family History  Problem Relation Age of Onset   Diabetes Mother    Cancer Father        prostate   Colon cancer Neg Hx     CURRENT MEDICATIONS:  Outpatient Encounter Medications as of 12/23/2020  Medication Sig   atorvastatin (LIPITOR) 40 MG tablet Take 1 tablet (40 mg  total) by mouth daily.   clopidogrel (PLAVIX) 75 MG tablet TAKE 1 TABLET BY MOUTH EVERY DAY   [employer] VITAMIN B12 1000 MCG TBCR TAKE 1 TABLET BY MOUTH EVERY DAY   furosemide (LASIX) 20 MG tablet TAKE 1 TABLET BY MOUTH EVERY DAY   lisinopril (ZESTRIL) 2.5 MG tablet TAKE 1 TABLET BY MOUTH EVERY DAY   [DISCONTINUED] nitroGLYCERIN (NITROSTAT) 0.4 MG SL tablet Place 1 tablet (0.4 mg total) under the tongue every 5 (five) minutes as needed for chest pain. (Patient not taking: Reported on 05/14/2018)   No facility-administered encounter medications on file as of 12/23/2020.    ALLERGIES:  No Known Allergies   PHYSICAL EXAM:  ECOG PERFORMANCE STATUS: 0 - Asymptomatic  There were no vitals filed for this visit. There were no vitals filed for this visit. Physical Exam Constitutional:      Appearance: Normal appearance.  HENT:     Head: Normocephalic and atraumatic.     Mouth/Throat:     Mouth: Mucous membranes are moist.  Eyes:     Extraocular Movements: Extraocular movements intact.     Pupils: Pupils are equal, round, and reactive to light.  Cardiovascular:     Rate and Rhythm: Normal rate and regular rhythm.     Pulses: Normal  pulses.     Heart sounds: Normal heart sounds.  Pulmonary:     Effort: Pulmonary effort is normal.     Breath sounds: Normal breath sounds.  Chest:  Breasts:    Right: No axillary adenopathy or supraclavicular adenopathy.     Left: No axillary adenopathy or supraclavicular adenopathy.  Abdominal:     General: Bowel sounds are normal.     Palpations: Abdomen is soft. There is no splenomegaly.     Tenderness: There is no abdominal tenderness.  Musculoskeletal:        General: No swelling.     Right lower leg: Edema (trace) present.     Left lower leg: Edema (1+) present.  Lymphadenopathy:     Head:     Right side of head: No submental, submandibular, tonsillar, preauricular, posterior auricular or occipital adenopathy.     Left side of head: No submental, submandibular, tonsillar, preauricular, posterior auricular or occipital adenopathy.     Cervical: No cervical adenopathy.     Right cervical: No superficial, deep or posterior cervical adenopathy.    Left cervical: No superficial, deep or posterior cervical adenopathy.     Upper Body:     Right upper body: No supraclavicular or axillary adenopathy.     Left upper body: No supraclavicular or axillary adenopathy.     Lower Body: No right inguinal adenopathy. No left inguinal adenopathy.  Skin:    General: Skin is warm and dry.  Neurological:     General: No focal deficit present.     Mental Status: He is alert and oriented to person, place, and time.  Psychiatric:        Mood and Affect: Mood normal.        Behavior: Behavior normal.     LABORATORY DATA:  I have reviewed the labs as listed.  CBC    Component Value Date/Time   WBC 13.3 (H) 12/16/2020 1320   RBC 4.83 12/16/2020 1320   HGB 14.5 12/16/2020 1320   HCT 45.8 12/16/2020 1320   PLT 199 12/16/2020 1320   MCV 94.8 12/16/2020 1320   MCH 30.0 12/16/2020 1320   MCHC 31.7 12/16/2020 1320   RDW 14.0 12/16/2020 1320  LYMPHSABS 7.6 (H) 12/16/2020 1320   MONOABS 0.6  12/16/2020 1320   EOSABS 0.2 12/16/2020 1320   BASOSABS 0.1 12/16/2020 1320   CMP Latest Ref Rng & Units 12/16/2020 11/18/2020 08/13/2020  Glucose 70 - 99 mg/dL 149(H) 119 92  BUN 8 - 23 mg/dL 23 18 17   Creatinine 0.61 - 1.24 mg/dL 1.53(H) 1.59(H) 1.66(H)  Sodium 135 - 145 mmol/L 138 140 140  Potassium 3.5 - 5.1 mmol/L 3.9 4.1 4.1  Chloride 98 - 111 mmol/L 106 108 105  CO2 22 - 32 mmol/L 24 24 26   Calcium 8.9 - 10.3 mg/dL 9.2 9.3 9.4  Total Protein 6.5 - 8.1 g/dL 6.5 6.3 6.8  Total Bilirubin 0.3 - 1.2 mg/dL 1.6(H) 1.0 0.8  Alkaline Phos 38 - 126 U/L 53 - 49  AST 15 - 41 U/L 22 17 21   ALT 0 - 44 U/L 15 9 14     DIAGNOSTIC IMAGING:  I have independently reviewed the relevant imaging and discussed with the patient.  ASSESSMENT & PLAN: 1.  Chronic lymphocytic anemia (Rai stage 0, Binet stage A) - Originally diagnosed in 2008, has been on watchful waiting since then with expected waxing and waning of his CLL - No fevers, night sweats, weight loss, or extreme fatigue.  No recurrent infections. - No splenomegaly noted on CT abdomen/pelvis on 09/07/2017 - No lymphadenopathy or hepatosplenomegaly appreciated on exam  - Most recent labs (12/16/2020): WBC 13.3 with absolute lymphocytes 7.6, mildly increased from previous - PLAN: Repeat labs and RTC in 4 months.  No indication for treatment at this time.  2.  History of provoked DVT  - DVT of left lower extremity during hospitalization for COVID-19 infection in December 2020 - Anticoagulation was not started due to diverticular GI bleed at that time - IVC filter placed on 05/27/2019 - per note by interventional radiologist (Dr. Jacqulynn Cadet) on 09/11/2019, IVC filter is to be considered a permanent device - Most recent venous duplex ultrasound (09/11/2019): Chronic thrombus of left common femoral, femoral, profunda femoral, and popliteal veins. - Not on anticoagulation at this time (although he does take Plavix for CAD with stents)  - PLAN: Stable  at this time with IVC filter in place  3.  Normocytic anemia - Intermittent anemia from iron deficiency and mild CKD - Received Venofer x5 doses from 07/23/2019 through 08/05/2019 - Most recent labs (12/16/2020): Hgb normal at 14.5 with normal ferritin 198 and iron saturation 35% - PLAN: Stable at this time, repeat labs and RTC in 4 months  4.  CKD stage III - Most recent creatinine (12/16/2020): 1.53, stable at baseline - Was referred to nephrologist at his last visit, now sees [name] - PLAN: Continue follow-up with [name]   PLAN SUMMARY & DISPOSITION: -Repeat labs and RTC in 4 months  All questions were answered. The patient knows to call the clinic with any problems, questions or concerns.  Medical decision making: Moderate  Time spent on visit: I spent 20 minutes counseling the patient face to face. The total time spent in the appointment was 30 minutes and more than 50% was on counseling.   Harriett Rush, PA-C  12/23/2020 11:19 AM

## 2020-12-23 NOTE — Patient Instructions (Signed)
[place] at [facility] Discharge Instructions  You were seen today by Tarri Abernethy PA-C for your CLL (chronic lymphocytic leukemia).  This is a type of slow-growing leukemia (blood cancer).  Right now, you are not having any symptoms of your leukemia and your blood counts are stable.  You do not require any treatment at this time, but we will continue to monitor you periodically to check your blood counts, symptoms, and physical exam.  Your anemia is also stable.  You do not have any concerning low blood counts at this time.  LABS: Return in 4 months for repeat labs  OTHER TESTS: None  MEDICATIONS: No changes to home medication  FOLLOW-UP APPOINTMENT: Office visit in 4 months - If you need to be seen sooner than 4 months, please call our office.  Be on the look out for "alarm symptoms" such as unexplained fever, chills, night sweats, unintentional weight loss.  If any of these occur, please call our office to schedule an appointment.   Thank you for choosing [place] at [facility] to provide your oncology and hematology care.  To afford each patient quality time with our provider, please arrive at least 15 minutes before your scheduled appointment time.   If you have a lab appointment with the [facility] please come in thru the Main Entrance and check in at the main information desk.  You need to re-schedule your appointment should you arrive 10 or more minutes late.  We strive to give you quality time with our providers, and arriving late affects you and other patients whose appointments are after yours.  Also, if you no show three or more times for appointments you may be dismissed from the clinic at the providers discretion.     Again, thank you for choosing [facility].  Our hope is that these requests will decrease the amount of time that you wait before being seen by our physicians.        _____________________________________________________________  Should you have questions after your visit to [facility], please contact our office at [phone] and follow the prompts.  Our office hours are 8:00 a.m. and 4:30 p.m. Monday - Friday.  Please note that voicemails left after 4:00 p.m. may not be returned until the following business day.  We are closed weekends and major holidays.  You do have access to a nurse 24-7, just call the main number to the clinic [phone] and do not press any options, hold on the line and a nurse will answer the phone.    For prescription refill requests, have your pharmacy contact our office and allow 72 hours.    Due to Covid, you will need to wear a mask upon entering the hospital. If you do not have a mask, a mask will be given to you at the Main Entrance upon arrival. For doctor visits, patients may have 1 support person age 6 or older with them. For treatment visits, patients can not have anyone with them due to social distancing guidelines and our immunocompromised population.

## 2021-01-07 ENCOUNTER — Encounter (INDEPENDENT_AMBULATORY_CARE_PROVIDER_SITE_OTHER): Payer: Self-pay

## 2021-02-14 ENCOUNTER — Encounter: Payer: Self-pay | Admitting: Gastroenterology

## 2021-02-14 NOTE — Progress Notes (Signed)
Referring Provider: No ref. provider found Primary Care Physician:  [name], MD (Inactive) Primary GI Physician: Dr. Gala Romney  Chief Complaint  Patient presents with   Dysphagia    Sometimes comes back up    HPI:   [name] is a 75 y.o. male presenting today with a history of colonic adenomas, prior GI bleeding though to be diverticular, GERD, dysphagia. Last colonoscopy 02/21/2018: Diverticulosis of the sigmoid and descending colon, otherwise normal exam.  No recommendations to repeat.  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  Follow-up patient with benefit from EGD with esophageal dilation, but this was never completed.  He is presenting today with chief complaint of dysphagia.   Patient presents today with his wife.  Reports trouble swallowing. Feels foods get hung in esophagus. Has to regurgitate. Trying to follow a soft diet. Liquids do ok. No reflux symptoms or heartburn. No weight loss.  Dysphagia symptoms occur 1-2 times a week.   He does report poor dentition.  He has a few teeth on the bottom, but no teeth on the top.  He needs to get top dentures.  No blood in the stool or black stools. No nausea or vomiting. Bowels are moving well.  No chest pain, heart palpitations, or shortness of breath. Occasional cough.    Past Medical History:  Diagnosis Date   Acute MI, inferolateral wall, initial episode of care ([place]) 06/16/2015   99% dCx    BPH (benign prostatic hyperplasia) 04/08/2011   CAD S/P percutaneous coronary angioplasty 11/23/12; 06/16/15   a. NSTEMI 2014 with 2 overlapping mRCA lesions - Xience Xpedition DES 2.75 mm x 18 mm (3.0 mm). b. Inf-Lat STEMI 06/16/2015 - dCx Asp Thrombectomy & PCI 3.0 x 15 Xience drDES (3.6 mm).   Chronic combined systolic and diastolic CHF (congestive heart failure) (HCC)    Chronic headache    CKD (chronic kidney disease),  stage III (HCC)    CLL (chronic lymphocytic leukemia) ([place]) 04/08/2011   Depression    Diverticulitis    DVT (deep venous thrombosis) ([place]) 2021   Left, no anticoagulation due to history of diverticular bleeding, IVC filter placed.   Gallstone    GERD (gastroesophageal reflux disease)    Hypercholesteremia    Ischemic cardiomyopathy    Echo 06/18/2015 EF 35-40% after lateral MI -> repeat echo 08/06/2015: EF 40-45% with basal-mid inferolateral akinesis and hypokinesis of the lateral wall.   NSTEMI (non-ST elevated myocardial infarction) ([place]) 11/23/2012   NSTEMI 11/23/2012 RCA lesion - PCI with DES; moderate LAD disease   PTSD (post-traumatic stress disorder)    With Depression - since MI    Past Surgical History:  Procedure Laterality Date   APPENDECTOMY  1968-70   CARDIAC CATHETERIZATION N/A 06/16/2015   Procedure: Left Heart Cath and Coronary Angiography;  Surgeon: Jettie Booze, MD;  Location: [place] [facility];  Service: Cardiovascular: 99% thrombotic dCx -> asp thrombectomy & PCI. RCA Stents Patent.    CARDIAC CATHETERIZATION N/A 06/16/2015   Procedure: Coronary Stent Intervention;  Surgeon: Jettie Booze, MD;  Location: [place] [facility];  Service: Cardiovascular: PCI dCx = thrombectomy -> 3.0 x 15 Xience drug-eluting stent (3.6 mm)    COLONOSCOPY  2008   [id number] diverticula, diminutive polyp in the cecum, s/p bx Normal rectum. Tubular adenoma   COLONOSCOPY N/A 10/17/2012   Dr. Gala Romney: colonic diverticulosis, tubular adenoma,  surveillance due May 2019   COLONOSCOPY N/A 02/21/2018   Procedure: COLONOSCOPY;  Surgeon: Daneil Dolin, MD; Diverticulosis of the sigmoid and descending colon, otherwise normal exam.  No recommendations to repeat.   CORONARY STENT PLACEMENT  11/23/2012   Mid RCA -- Xience eXp - 2.75 mm x 18 mm DES (3.52m) , Dr. HEllyn Hack  IR IVC FILTER PLMT / S&I /Burke KeelsGUID/MOD SED  05/27/2019   IR RADIOLOGIST EVAL & MGMT  09/11/2019   LEFT HEART  CATHETERIZATION WITH CORONARY ANGIOGRAM N/A 11/23/2012   Procedure: LEFT HEART CATHETERIZATION WITH CORONARY ANGIOGRAM;  Surgeon: DLeonie Man MD;  [facility];  Service: Cardiovascular: NSTEMI: Culprit = mid RCA 95% & 75%; LAD ~40-50%, Small RI ~60%; EF ~60%, mild inf-basal HK      TRANSTHORACIC ECHOCARDIOGRAM  January 2017; March 2017   a. EF 35-40%. Entire inferior hypokinesis and akinesis of the basal and mid inferolateral and anterolateral /distal lateral walls;; b. EF 40 and 45%. Akinesis of basal-mid inferolateral wall. Hypokinesis of entire lateral wall.    Current Outpatient Medications  Medication Sig Dispense Refill   atorvastatin (LIPITOR) 40 MG tablet Take 1 tablet (40 mg total) by mouth daily. 90 tablet 1   clopidogrel (PLAVIX) 75 MG tablet TAKE 1 TABLET BY MOUTH EVERY DAY 90 tablet 1   CVS VITAMIN B12 1000 MCG TBCR TAKE 1 TABLET BY MOUTH EVERY DAY 60 tablet 3   furosemide (LASIX) 20 MG tablet TAKE 1 TABLET BY MOUTH EVERY DAY 90 tablet 1   lisinopril (ZESTRIL) 2.5 MG tablet TAKE 1 TABLET BY MOUTH EVERY DAY 90 tablet 3   No current facility-administered medications for this visit.    Allergies as of 02/15/2021   (No Known Allergies)    Family History  Problem Relation Age of Onset   Diabetes Mother    Cancer Father        prostate   Colon cancer Neg Hx    Esophageal cancer Neg Hx    Stomach cancer Neg Hx     Social History   Socioeconomic History   Marital status: Married    Spouse name: Not on file   Number of children: Not on file   Years of education: Not on file   Highest education level: Not on file  Occupational History   Not on file  Tobacco Use   Smoking status: Former    Packs/day: 0.25    Years: 2.00    Pack years: 0.50    Types: Cigarettes    Quit date: 06/07/1963    Years since quitting: 57.7   Smokeless tobacco: Never  Vaping Use   Vaping Use: Never used  Substance and Sexual Activity   Alcohol use: No    Alcohol/week: 0.0  standard drinks    Comment: Occasionally drinks red wine   Drug use: No   Sexual activity: Not on file  Other Topics Concern   Not on file  Social History Narrative   Exercises 3 to 4 days a week at the fitness center for about a half an hour at a time.   "former smoker". Does not drink EtOH   Married.   Social Determinants of Health   Financial Resource Strain: Not on file  Food Insecurity: Not on file  Transportation Needs: Not on file  Physical Activity: Not on file  Stress: Not on file  Social Connections: Not on file    Review of Systems: Gen: Denies fever, chills, cold or flulike  symptoms, presyncope, syncope. CV: Denies chest pain, palpitations. Resp: Denies dyspnea or cough. GI: See HPI Heme: See HPI  Physical Exam: BP 137/73   Pulse (!) 57   Temp 97.7 F (36.5 C) (Temporal)   Ht '5\' 5"'$  (1.651 m)   Wt 165 lb 3.2 oz (74.9 kg)   BMI 27.49 kg/m  General:   Alert and oriented. No distress noted. Pleasant and cooperative. Seems to have some mild cognitive impairment.  Head:  Normocephalic and atraumatic. Eyes:  Conjuctiva clear without scleral icterus. Heart:  S1, S2 present without murmurs appreciated. Lungs:  Clear to auscultation bilaterally. No wheezes, rales, or rhonchi. No distress.  Abdomen:  +BS, soft, non-tender and non-distended. No rebound or guarding. No HSM or masses noted. Msk:  Symmetrical without gross deformities. Normal posture. Extremities: 1+ pitting edema in the bilateral lower extremities, not extending above mid shin. Neurologic:  Alert and  oriented x3 Psych:  Normal mood and affect.    Assessment: 75 year old male presenting today for further evaluation of dysphagia.  Reports sensation of solid food items getting stuck in his esophagus a couple times a week requiring regurgitation.  No trouble with liquids.  Denies GERD symptoms. Dysphagia has been present for several years.  Notably, BPE in 2017 with sliding hiatal hernia with a stricture  at GE junction, questioning Schatzki's ring, obstructing a 12.5 mm barium tablet.  EGD was recommended at that time, but has never been completed.  He will need EGD for further evaluation and therapeutic intervention.  In addition to suspected Schatzki's ring, cannot rule out underlying malignancy.  Poor dentition likely influencing symptoms as well.   Plan: 1.  Proceed with EGD +/- dilation with Dr. Gala Romney in the near future. The risks, benefits, and alternatives have been discussed with the patient and his wife in detail.  All parties state their understanding and desire to proceed.  2.  Recommended soft diet with chopped/minced meats, avoid tough textures and dry foods, drink plenty of liquids throughout meals. 3.  Follow-up after procedure.    Aliene Altes, PA-C [facility] 02/15/2021

## 2021-02-14 NOTE — H&P (View-Only) (Signed)
Referring Provider: No ref. provider found Primary Care Physician:  [name], MD (Inactive) Primary GI Physician: Dr. Gala Romney  Chief Complaint  Patient presents with   Dysphagia    Sometimes comes back up    HPI:   [name] is a 75 y.o. male presenting today with a history of colonic adenomas, prior GI bleeding though to be diverticular, GERD, dysphagia. Last colonoscopy 02/21/2018: Diverticulosis of the sigmoid and descending colon, otherwise normal exam.  No recommendations to repeat.  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  Follow-up patient with benefit from EGD with esophageal dilation, but this was never completed.  He is presenting today with chief complaint of dysphagia.   Patient presents today with his wife.  Reports trouble swallowing. Feels foods get hung in esophagus. Has to regurgitate. Trying to follow a soft diet. Liquids do ok. No reflux symptoms or heartburn. No weight loss.  Dysphagia symptoms occur 1-2 times a week.   He does report poor dentition.  He has a few teeth on the bottom, but no teeth on the top.  He needs to get top dentures.  No blood in the stool or black stools. No nausea or vomiting. Bowels are moving well.  No chest pain, heart palpitations, or shortness of breath. Occasional cough.    Past Medical History:  Diagnosis Date   Acute MI, inferolateral wall, initial episode of care ([place]) 06/16/2015   99% dCx    BPH (benign prostatic hyperplasia) 04/08/2011   CAD S/P percutaneous coronary angioplasty 11/23/12; 06/16/15   a. NSTEMI 2014 with 2 overlapping mRCA lesions - Xience Xpedition DES 2.75 mm x 18 mm (3.0 mm). b. Inf-Lat STEMI 06/16/2015 - dCx Asp Thrombectomy & PCI 3.0 x 15 Xience drDES (3.6 mm).   Chronic combined systolic and diastolic CHF (congestive heart failure) (HCC)    Chronic headache    CKD (chronic kidney disease),  stage III (HCC)    CLL (chronic lymphocytic leukemia) ([place]) 04/08/2011   Depression    Diverticulitis    DVT (deep venous thrombosis) ([place]) 2021   Left, no anticoagulation due to history of diverticular bleeding, IVC filter placed.   Gallstone    GERD (gastroesophageal reflux disease)    Hypercholesteremia    Ischemic cardiomyopathy    Echo 06/18/2015 EF 35-40% after lateral MI -> repeat echo 08/06/2015: EF 40-45% with basal-mid inferolateral akinesis and hypokinesis of the lateral wall.   NSTEMI (non-ST elevated myocardial infarction) ([place]) 11/23/2012   NSTEMI 11/23/2012 RCA lesion - PCI with DES; moderate LAD disease   PTSD (post-traumatic stress disorder)    With Depression - since MI    Past Surgical History:  Procedure Laterality Date   APPENDECTOMY  1968-70   CARDIAC CATHETERIZATION N/A 06/16/2015   Procedure: Left Heart Cath and Coronary Angiography;  Surgeon: Jettie Booze, MD;  Location: [place] [facility];  Service: Cardiovascular: 99% thrombotic dCx -> asp thrombectomy & PCI. RCA Stents Patent.    CARDIAC CATHETERIZATION N/A 06/16/2015   Procedure: Coronary Stent Intervention;  Surgeon: Jettie Booze, MD;  Location: [place] [facility];  Service: Cardiovascular: PCI dCx = thrombectomy -> 3.0 x 15 Xience drug-eluting stent (3.6 mm)    COLONOSCOPY  2008   [id number] diverticula, diminutive polyp in the cecum, s/p bx Normal rectum. Tubular adenoma   COLONOSCOPY N/A 10/17/2012   Dr. Gala Romney: colonic diverticulosis, tubular adenoma,  surveillance due May 2019   COLONOSCOPY N/A 02/21/2018   Procedure: COLONOSCOPY;  Surgeon: Daneil Dolin, MD; Diverticulosis of the sigmoid and descending colon, otherwise normal exam.  No recommendations to repeat.   CORONARY STENT PLACEMENT  11/23/2012   Mid RCA -- Xience eXp - 2.75 mm x 18 mm DES (3.16m) , Dr. HEllyn Hack  IR IVC FILTER PLMT / S&I /Burke KeelsGUID/MOD SED  05/27/2019   IR RADIOLOGIST EVAL & MGMT  09/11/2019   LEFT HEART  CATHETERIZATION WITH CORONARY ANGIOGRAM N/A 11/23/2012   Procedure: LEFT HEART CATHETERIZATION WITH CORONARY ANGIOGRAM;  Surgeon: DLeonie Man MD;  [facility];  Service: Cardiovascular: NSTEMI: Culprit = mid RCA 95% & 75%; LAD ~40-50%, Small RI ~60%; EF ~60%, mild inf-basal HK      TRANSTHORACIC ECHOCARDIOGRAM  January 2017; March 2017   a. EF 35-40%. Entire inferior hypokinesis and akinesis of the basal and mid inferolateral and anterolateral /distal lateral walls;; b. EF 40 and 45%. Akinesis of basal-mid inferolateral wall. Hypokinesis of entire lateral wall.    Current Outpatient Medications  Medication Sig Dispense Refill   atorvastatin (LIPITOR) 40 MG tablet Take 1 tablet (40 mg total) by mouth daily. 90 tablet 1   clopidogrel (PLAVIX) 75 MG tablet TAKE 1 TABLET BY MOUTH EVERY DAY 90 tablet 1   CVS VITAMIN B12 1000 MCG TBCR TAKE 1 TABLET BY MOUTH EVERY DAY 60 tablet 3   furosemide (LASIX) 20 MG tablet TAKE 1 TABLET BY MOUTH EVERY DAY 90 tablet 1   lisinopril (ZESTRIL) 2.5 MG tablet TAKE 1 TABLET BY MOUTH EVERY DAY 90 tablet 3   No current facility-administered medications for this visit.    Allergies as of 02/15/2021   (No Known Allergies)    Family History  Problem Relation Age of Onset   Diabetes Mother    Cancer Father        prostate   Colon cancer Neg Hx    Esophageal cancer Neg Hx    Stomach cancer Neg Hx     Social History   Socioeconomic History   Marital status: Married    Spouse name: Not on file   Number of children: Not on file   Years of education: Not on file   Highest education level: Not on file  Occupational History   Not on file  Tobacco Use   Smoking status: Former    Packs/day: 0.25    Years: 2.00    Pack years: 0.50    Types: Cigarettes    Quit date: 06/07/1963    Years since quitting: 57.7   Smokeless tobacco: Never  Vaping Use   Vaping Use: Never used  Substance and Sexual Activity   Alcohol use: No    Alcohol/week: 0.0  standard drinks    Comment: Occasionally drinks red wine   Drug use: No   Sexual activity: Not on file  Other Topics Concern   Not on file  Social History Narrative   Exercises 3 to 4 days a week at the fitness center for about a half an hour at a time.   "former smoker". Does not drink EtOH   Married.   Social Determinants of Health   Financial Resource Strain: Not on file  Food Insecurity: Not on file  Transportation Needs: Not on file  Physical Activity: Not on file  Stress: Not on file  Social Connections: Not on file    Review of Systems: Gen: Denies fever, chills, cold or flulike  symptoms, presyncope, syncope. CV: Denies chest pain, palpitations. Resp: Denies dyspnea or cough. GI: See HPI Heme: See HPI  Physical Exam: BP 137/73   Pulse (!) 57   Temp 97.7 F (36.5 C) (Temporal)   Ht '5\' 5"'$  (1.651 m)   Wt 165 lb 3.2 oz (74.9 kg)   BMI 27.49 kg/m  General:   Alert and oriented. No distress noted. Pleasant and cooperative. Seems to have some mild cognitive impairment.  Head:  Normocephalic and atraumatic. Eyes:  Conjuctiva clear without scleral icterus. Heart:  S1, S2 present without murmurs appreciated. Lungs:  Clear to auscultation bilaterally. No wheezes, rales, or rhonchi. No distress.  Abdomen:  +BS, soft, non-tender and non-distended. No rebound or guarding. No HSM or masses noted. Msk:  Symmetrical without gross deformities. Normal posture. Extremities: 1+ pitting edema in the bilateral lower extremities, not extending above mid shin. Neurologic:  Alert and  oriented x3 Psych:  Normal mood and affect.    Assessment: 75 year old male presenting today for further evaluation of dysphagia.  Reports sensation of solid food items getting stuck in his esophagus a couple times a week requiring regurgitation.  No trouble with liquids.  Denies GERD symptoms. Dysphagia has been present for several years.  Notably, BPE in 2017 with sliding hiatal hernia with a stricture  at GE junction, questioning Schatzki's ring, obstructing a 12.5 mm barium tablet.  EGD was recommended at that time, but has never been completed.  He will need EGD for further evaluation and therapeutic intervention.  In addition to suspected Schatzki's ring, cannot rule out underlying malignancy.  Poor dentition likely influencing symptoms as well.   Plan: 1.  Proceed with EGD +/- dilation with Dr. Gala Romney in the near future. The risks, benefits, and alternatives have been discussed with the patient and his wife in detail.  All parties state their understanding and desire to proceed.  2.  Recommended soft diet with chopped/minced meats, avoid tough textures and dry foods, drink plenty of liquids throughout meals. 3.  Follow-up after procedure.    Aliene Altes, PA-C [facility] 02/15/2021

## 2021-02-15 ENCOUNTER — Ambulatory Visit: Payer: Medicare HMO | Admitting: Gastroenterology

## 2021-02-15 ENCOUNTER — Other Ambulatory Visit: Payer: Self-pay

## 2021-02-15 ENCOUNTER — Encounter: Payer: Self-pay | Admitting: Gastroenterology

## 2021-02-15 VITALS — BP 137/73 | HR 57 | Temp 97.7°F | Ht 65.0 in | Wt 165.2 lb

## 2021-02-15 DIAGNOSIS — R1310 Dysphagia, unspecified: Secondary | ICD-10-CM | POA: Diagnosis not present

## 2021-02-15 NOTE — Patient Instructions (Signed)
We will arrange for you to have an upper endoscopy with possible stretching of your esophagus in the near future with Dr. Gala Romney.  Follow a soft diet and chew thoroughly.  All meats should be chopped finely or minced.  Avoid tough meats.  Try to eat moisten foods.  Drink plenty of liquids throughout meals.  If something gets stuck in your esophagus and will not come up or go down, you should proceed to the emergency room.  We will plan to see you back in the office after your procedure.   It was a pleasure meeting you today!   Aliene Altes, PA-C [facility]

## 2021-02-18 ENCOUNTER — Ambulatory Visit (INDEPENDENT_AMBULATORY_CARE_PROVIDER_SITE_OTHER): Payer: Medicare HMO | Admitting: Nurse Practitioner

## 2021-02-22 NOTE — Progress Notes (Signed)
Cardiology Office Note    Date:  02/24/2021   ID:  [name], DOB 11-19-1945, MRN [id number]   PCP:  Celene Squibb, MD   [place]  Cardiologist:  Glenetta Hew, MD   Advanced Practice Provider:  No care team member to display Electrophysiologist:  None   [id number]}   Chief Complaint  Patient presents with   Follow-up     History of Present Illness:  [name] is a 75 y.o. male with history of CAD status post NSTEMI treated with DES to the RCA 2014, inferior lateral STEMI 2017 DES to the circumflex , ischemic cardiomyopathy EF 40 to 45% hypertension, HLD, former tobacco abuse,  Patient last saw Dr. Ellyn Hack 07/14/2020 and was doing well.  He was having memory issues and some sundowning.  Consider converting to rosuvastatin.   Patient comes in for f/u. Denies chest pain, dyspnea, edema. Walks an hour 3 days/week. Labs 12/2020 Crt 1.53, LDL 81 11/2020. Having trouble with dysphagia and having endoscopy next week.      Past Medical History:  Diagnosis Date   Acute MI, inferolateral wall, initial episode of care ([place]) 06/16/2015   99% dCx    BPH (benign prostatic hyperplasia) 04/08/2011   CAD S/P percutaneous coronary angioplasty 11/23/12; 06/16/15   a. NSTEMI 2014 with 2 overlapping mRCA lesions - Xience Xpedition DES 2.75 mm x 18 mm (3.0 mm). b. Inf-Lat STEMI 06/16/2015 - dCx Asp Thrombectomy & PCI 3.0 x 15 Xience drDES (3.6 mm).   Chronic combined systolic and diastolic CHF (congestive heart failure) (HCC)    Chronic headache    CKD (chronic kidney disease), stage III (HCC)    CLL (chronic lymphocytic leukemia) ([place]) 04/08/2011   Depression    Diverticulitis    DVT (deep venous thrombosis) ([place]) 2021   Left, no anticoagulation due to history of diverticular bleeding, IVC filter placed.   Gallstone    GERD (gastroesophageal reflux disease)    Hypercholesteremia    Ischemic cardiomyopathy    Echo 06/18/2015 EF 35-40% after lateral MI ->  repeat echo 08/06/2015: EF 40-45% with basal-mid inferolateral akinesis and hypokinesis of the lateral wall.   NSTEMI (non-ST elevated myocardial infarction) ([place]) 11/23/2012   NSTEMI 11/23/2012 RCA lesion - PCI with DES; moderate LAD disease   PTSD (post-traumatic stress disorder)    With Depression - since MI    Past Surgical History:  Procedure Laterality Date   APPENDECTOMY  1968-70   CARDIAC CATHETERIZATION N/A 06/16/2015   Procedure: Left Heart Cath and Coronary Angiography;  Surgeon: Jettie Booze, MD;  Location: [facility];  Service: Cardiovascular: 99% thrombotic dCx -> asp thrombectomy & PCI. RCA Stents Patent.    CARDIAC CATHETERIZATION N/A 06/16/2015   Procedure: Coronary Stent Intervention;  Surgeon: Jettie Booze, MD;  Location: [facility];  Service: Cardiovascular: PCI dCx = thrombectomy -> 3.0 x 15 Xience drug-eluting stent (3.6 mm)    COLONOSCOPY  2008   OHY:[id number] diverticula, diminutive polyp in the cecum, s/p bx Normal rectum. Tubular adenoma   COLONOSCOPY N/A 10/17/2012   Dr. Gala Romney: colonic diverticulosis, tubular adenoma, surveillance due May 2019   COLONOSCOPY N/A 02/21/2018   Procedure: COLONOSCOPY;  Surgeon: Daneil Dolin, MD; Diverticulosis of the sigmoid and descending colon, otherwise normal exam.  No recommendations to repeat.   CORONARY STENT PLACEMENT  11/23/2012   Mid RCA -- Xience eXp - 2.75 mm x 18 mm DES (3.73mm) , Dr. Ellyn Hack  IR IVC FILTER PLMT / S&I /IMG GUID/MOD SED  05/27/2019   IR RADIOLOGIST EVAL & MGMT  09/11/2019   LEFT HEART CATHETERIZATION WITH CORONARY ANGIOGRAM N/A 11/23/2012   Procedure: LEFT HEART CATHETERIZATION WITH CORONARY ANGIOGRAM;  Surgeon: Leonie Man, MD;  Location: [facility];  Service: Cardiovascular: NSTEMI: Culprit = mid RCA 95% & 75%; LAD ~40-50%, Small RI ~60%; EF ~60%, mild inf-basal HK      TRANSTHORACIC ECHOCARDIOGRAM  January 2017; March 2017   a. EF 35-40%. Entire inferior  hypokinesis and akinesis of the basal and mid inferolateral and anterolateral /distal lateral walls;; b. EF 40 and 45%. Akinesis of basal-mid inferolateral wall. Hypokinesis of entire lateral wall.    Current Medications: Current Meds  Medication Sig   atorvastatin (LIPITOR) 40 MG tablet Take 1 tablet (40 mg total) by mouth daily.   clopidogrel (PLAVIX) 75 MG tablet TAKE 1 TABLET BY MOUTH EVERY DAY   [facility] VITAMIN B12 1000 MCG TBCR TAKE 1 TABLET BY MOUTH EVERY DAY   furosemide (LASIX) 20 MG tablet TAKE 1 TABLET BY MOUTH EVERY DAY   lisinopril (ZESTRIL) 2.5 MG tablet TAKE 1 TABLET BY MOUTH EVERY DAY     Allergies:   Patient has no known allergies.   Social History   Socioeconomic History   Marital status: Married    Spouse name: Not on file   Number of children: Not on file   Years of education: Not on file   Highest education level: Not on file  Occupational History   Not on file  Tobacco Use   Smoking status: Former    Packs/day: 0.25    Years: 2.00    Pack years: 0.50    Types: Cigarettes    Quit date: 06/07/1963    Years since quitting: 57.7   Smokeless tobacco: Never  Vaping Use   Vaping Use: Never used  Substance and Sexual Activity   Alcohol use: No    Alcohol/week: 0.0 standard drinks    Comment: Occasionally drinks red wine   Drug use: No   Sexual activity: Not on file  Other Topics Concern   Not on file  Social History Narrative   Exercises 3 to 4 days a week at the fitness center for about a half an hour at a time.   "former smoker". Does not drink EtOH   Married.   Social Determinants of Health   Financial Resource Strain: Not on file  Food Insecurity: Not on file  Transportation Needs: Not on file  Physical Activity: Not on file  Stress: Not on file  Social Connections: Not on file     Family History:  The patient's  family history includes Cancer in his father; Diabetes in his mother.   ROS:   Please see the history of present illness.    ROS  All other systems reviewed and are negative.   PHYSICAL EXAM:   VS:  BP 116/64   Pulse 69   Ht 5\' 7"  (1.702 m)   Wt 164 lb 6.4 oz (74.6 kg)   SpO2 96%   BMI 25.75 kg/m   Physical Exam  GEN: Well nourished, well developed, in no acute distress  Neck: no JVD, carotid bruits, or masses Cardiac:RRR; no murmurs, rubs, or gallops  Respiratory:  clear to auscultation bilaterally, normal work of breathing GI: soft, nontender, nondistended, + BS Ext: plus 1 edema bilaterally Neuro:  Alert and Oriented x 3 Psych: euthymic mood, full affect  Wt Readings from  Last 3 Encounters:  02/24/21 164 lb 6.4 oz (74.6 kg)  02/15/21 165 lb 3.2 oz (74.9 kg)  12/23/20 153 lb (69.4 kg)      Studies/Labs Reviewed:   EKG:  EKG is not ordered today.     Recent Labs: 12/16/2020: ALT 15; BUN 23; Creatinine, Ser 1.53; Hemoglobin 14.5; Platelets 199; Potassium 3.9; Sodium 138   Lipid Panel    Component Value Date/Time   CHOL 133 11/18/2020 1104   CHOL 129 12/14/2016 1007   TRIG 140 11/18/2020 1104   HDL 28 (L) 11/18/2020 1104   HDL 32 (L) 12/14/2016 1007   CHOLHDL 4.8 11/18/2020 1104   VLDL 24 06/16/2015 1425   [place] 81 11/18/2020 1104    Additional studies/ records that were reviewed today include:  2D echo 2017 Study Conclusions   - Left ventricle: The cavity size was normal. Wall thickness was    normal. Systolic function was mildly to moderately reduced. The    estimated ejection fraction was in the range of 40% to 45%. There    is akinesis of the basal-midinferolateral myocardium. There is    hypokinesis of the entirelateral myocardium. Doppler parameters    are consistent with abnormal left ventricular relaxation (grade 1    diastolic dysfunction).  - Mitral valve: There was mild regurgitation.   Transthoracic echocardiography.  M-mode, complete 2D, spectral  Doppler, and color Doppler.  Birthdate:  Patient birthdate:  August 21, 1945.  Age:  Patient is 75 yr old.  Sex:  Gender: male.   BMI: 26.1 kg/m^2.  Blood pressure:     152/69  Patient status:  Outpatient.  Study date:  Study date: 08/06/2015. Study time: 01:15  PM.  Location:  [facility]    Risk Assessment/Calculations:         ASSESSMENT:    1. Coronary artery disease involving native coronary artery of native heart without angina pectoris   2. Ischemic cardiomyopathy   3. Essential hypertension   4. Hyperlipidemia, unspecified hyperlipidemia type      PLAN:  In order of problems listed above:  CAD as listed above, no angina, no bleeding problems on Plavix  Ischemic cardiomyopathy EF 40 to 45%-some edema. Getting extra salt in his diet. Low sodium diet. Manintained on low does lisinopril and lasix. Crt 1.53 12/2020  Hypertension controlled  HLD LDL 81 on lipitor 40 mg daily. Memory loss, will not change  Iron deficiency anemia due to chronic blood loss  Dysphagia for endo next week. Surgical clearance not requested but overall he should be fine to have procedure. According to the Revised Cardiac Risk Index (RCRI), his Perioperative Risk of Major Cardiac Event is (%): 6.6  His Functional Capacity in METs is: 4.31 according to the [facility] Activity Status Index (DASI).   Shared Decision Making/Informed Consent        Medication Adjustments/Labs and Tests Ordered: Current medicines are reviewed at length with the patient today.  Concerns regarding medicines are outlined above.  Medication changes, Labs and Tests ordered today are listed in the Patient Instructions below. Patient Instructions  Medication Instructions:  Your physician recommends that you continue on your current medications as directed. Please refer to the Current Medication list given to you today.  *If you need a refill on your cardiac medications before your next appointment, please call your pharmacy*   Lab Work: NONE   If you have labs (blood work) drawn today and your tests are completely normal, you will receive  your results  only by: [employer] (if you have MyChart) OR A paper copy in the mail If you have any lab test that is abnormal or we need to change your treatment, we will call you to review the results.   Testing/Procedures: NONE    Follow-Up: At [facility], you and your health needs are our priority.  As part of our continuing mission to provide you with exceptional heart care, we have created designated Provider Care Teams.  These Care Teams include your primary Cardiologist (physician) and Advanced Practice Providers (APPs -  Physician Assistants and Nurse Practitioners) who all work together to provide you with the care you need, when you need it.  We recommend signing up for the patient portal called "MyChart".  Sign up information is provided on this After Visit Summary.  MyChart is used to connect with patients for Virtual Visits (Telemedicine).  Patients are able to view lab/test results, encounter notes, upcoming appointments, etc.  Non-urgent messages can be sent to your provider as well.   To learn more about what you can do with MyChart, go to [email or web address].    Your next appointment:   6 month(s)  The format for your next appointment:   In Person  Provider:   With MD    Other Instructions Thank you for choosing [facility]!  Two Gram Sodium Diet 2000 mg  What is Sodium? Sodium is a mineral found naturally in many foods. The most significant source of sodium in the diet is table salt, which is about 40% sodium.  Processed, convenience, and preserved foods also contain a large amount of sodium.  The body needs only 500 mg of sodium daily to function,  A normal diet provides more than enough sodium even if you do not use salt.  Why Limit Sodium? A build up of sodium in the body can cause thirst, increased blood pressure, shortness of breath, and water retention.  Decreasing sodium in the diet can reduce edema and risk of heart attack or stroke  associated with high blood pressure.  Keep in mind that there are many other factors involved in these health problems.  Heredity, obesity, lack of exercise, cigarette smoking, stress and what you eat all play a role.  General Guidelines: Do not add salt at the table or in cooking.  One teaspoon of salt contains over 2 grams of sodium. Read food labels Avoid processed and convenience foods Ask your dietitian before eating any foods not dicussed in the menu planning guidelines Consult your physician if you wish to use a salt substitute or a sodium containing medication such as antacids.  Limit milk and milk products to 16 oz (2 cups) per day.  Shopping Hints: READ LABELS!! "Dietetic" does not necessarily mean low sodium. Salt and other sodium ingredients are often added to foods during processing.    Menu Planning Guidelines Food Group Choose More Often Avoid  Beverages (see also the milk group All fruit juices, low-sodium, salt-free vegetables juices, low-sodium carbonated beverages Regular vegetable or tomato juices, commercially softened water used for drinking or cooking  Breads and Cereals Enriched white, wheat, rye and pumpernickel bread, hard rolls and dinner rolls; muffins, cornbread and waffles; most dry cereals, cooked cereal without added salt; unsalted crackers and breadsticks; low sodium or homemade bread crumbs Bread, rolls and crackers with salted tops; quick breads; instant hot cereals; pancakes; commercial bread stuffing; self-rising flower and biscuit mixes; regular bread crumbs or cracker crumbs  Desserts and Sweets Desserts and sweets  mad with mild should be within allowance Instant pudding mixes and cake mixes  Fats Butter or margarine; vegetable oils; unsalted salad dressings, regular salad dressings limited to 1 Tbs; light, sour and heavy cream Regular salad dressings containing bacon fat, bacon bits, and salt pork; snack dips made with instant soup mixes or processed  cheese; salted nuts  Fruits Most fresh, frozen and canned fruits Fruits processed with salt or sodium-containing ingredient (some dried fruits are processed with sodium sulfites        Vegetables Fresh, frozen vegetables and low- sodium canned vegetables Regular canned vegetables, sauerkraut, pickled vegetables, and others prepared in brine; frozen vegetables in sauces; vegetables seasoned with ham, bacon or salt pork  Condiments, Sauces, Miscellaneous  Salt substitute with physician's approval; pepper, herbs, spices; vinegar, lemon or lime juice; hot pepper sauce; garlic powder, onion powder, low sodium soy sauce (1 Tbs.); low sodium condiments (ketchup, chili sauce, mustard) in limited amounts (1 tsp.) fresh ground horseradish; unsalted tortilla chips, pretzels, potato chips, popcorn, salsa (1/4 cup) Any seasoning made with salt including garlic salt, celery salt, onion salt, and seasoned salt; sea salt, rock salt, kosher salt; meat tenderizers; monosodium glutamate; mustard, regular soy sauce, barbecue, sauce, chili sauce, teriyaki sauce, steak sauce, Worcestershire sauce, and most flavored vinegars; canned gravy and mixes; regular condiments; salted snack foods, olives, picles, relish, horseradish sauce, catsup   Food preparation: Try these seasonings Meats:    Pork Sage, onion Serve with applesauce  Chicken Poultry seasoning, thyme, parsley Serve with cranberry sauce  Lamb Curry powder, rosemary, garlic, thyme Serve with mint sauce or jelly  Veal Marjoram, basil Serve with current jelly, cranberry sauce  Beef Pepper, bay leaf Serve with dry mustard, unsalted chive butter  Fish [place] leaf, dill Serve with unsalted lemon butter, unsalted parsley butter  Vegetables:    Asparagus Lemon juice   Broccoli Lemon juice   Carrots Mustard dressing parsley, mint, nutmeg, glazed with unsalted butter and sugar   Green beans Marjoram, lemon juice, nutmeg,dill seed   Tomatoes Basil, marjoram, onion    Spice /blend for [employer]" 4 tsp ground thyme 1 tsp ground sage 3 tsp ground rosemary 4 tsp ground marjoram   Test your knowledge A product that says "Salt Free" may still contain sodium. True or False Garlic Powder and Hot Pepper Sauce an be used as alternative seasonings.True or False Processed foods have more sodium than fresh foods.  True or False Canned Vegetables have less sodium than froze True or False   WAYS TO DECREASE YOUR SODIUM INTAKE Avoid the use of added salt in cooking and at the table.  Table salt (and other prepared seasonings which contain salt) is probably one of the greatest sources of sodium in the diet.  Unsalted foods can gain flavor from the sweet, sour, and butter taste sensations of herbs and spices.  Instead of using salt for seasoning, try the following seasonings with the foods listed.  Remember: how you use them to enhance natural food flavors is limited only by your creativity... Allspice-Meat, fish, eggs, fruit, peas, red and yellow vegetables Almond Extract-Fruit baked goods Anise Seed-Sweet breads, fruit, carrots, beets, cottage cheese, cookies (tastes like licorice) Basil-Meat, fish, eggs, vegetables, rice, vegetables salads, soups, sauces Bay Leaf-Meat, fish, stews, poultry Burnet-Salad, vegetables (cucumber-like flavor) Caraway Seed-Bread, cookies, cottage cheese, meat, vegetables, cheese, rice Cardamon-Baked goods, fruit, soups Celery Powder or seed-Salads, salad dressings, sauces, meatloaf, soup, bread.Do not use  celery salt Chervil-Meats, salads, fish, eggs, vegetables, cottage cheese (  parsley-like flavor) Chili Power-Meatloaf, chicken cheese, corn, eggplant, egg dishes Chives-Salads cottage cheese, egg dishes, soups, vegetables, sauces Cilantro-Salsa, casseroles Cinnamon-Baked goods, fruit, pork, lamb, chicken, carrots Cloves-Fruit, baked goods, fish, pot roast, green beans, beets, carrots Coriander-Pastry, cookies, meat, salads, cheese  (lemon-orange flavor) Cumin-Meatloaf, fish,cheese, eggs, cabbage,fruit pie (caraway flavor) [employer], fruit, eggs, fish, poultry, cottage cheese, vegetables Dill Seed-Meat, cottage cheese, poultry, vegetables, fish, salads, bread Fennel Seed-Bread, cookies, apples, pork, eggs, fish, beets, cabbage, cheese, Licorice-like flavor Garlic-(buds or powder) Salads, meat, poultry, fish, bread, butter, vegetables, potatoes.Do not  use garlic salt Ginger-Fruit, vegetables, baked goods, meat, fish, poultry Horseradish Root-Meet, vegetables, butter Lemon Juice or Extract-Vegetables, fruit, tea, baked goods, fish salads Mace-Baked goods fruit, vegetables, fish, poultry (taste like nutmeg) Maple Extract-Syrups Marjoram-Meat, chicken, fish, vegetables, breads, green salads (taste like Sage) Mint-Tea, lamb, sherbet, vegetables, desserts, carrots, cabbage Mustard, Dry or Seed-Cheese, eggs, meats, vegetables, poultry Nutmeg-Baked goods, fruit, chicken, eggs, vegetables, desserts Onion Powder-Meat, fish, poultry, vegetables, cheese, eggs, bread, rice salads (Do not use   Onion salt) Orange Extract-Desserts, baked goods Oregano-Pasta, eggs, cheese, onions, pork, lamb, fish, chicken, vegetables, green salads Paprika-Meat, fish, poultry, eggs, cheese, vegetables Parsley Flakes-Butter, vegetables, meat fish, poultry, eggs, bread, salads (certain forms may   Contain sodium Pepper-Meat fish, poultry, vegetables, eggs Peppermint Extract-Desserts, baked goods Poppy Seed-Eggs, bread, cheese, fruit dressings, baked goods, noodles, vegetables, cottage  [employer], poultry, meat, fish, cauliflower, turnips,eggs bread Saffron-Rice, bread, veal, chicken, fish, eggs Sage-Meat, fish, poultry, onions, eggplant, tomateos, pork, stews Savory-Eggs, salads, poultry, meat, rice, vegetables, soups, pork Tarragon-Meat, poultry, fish, eggs, butter, vegetables (licorice-like  flavor)  Thyme-Meat, poultry, fish, eggs, vegetables, (clover-like flavor), sauces, soups Tumeric-Salads, butter, eggs, fish, rice, vegetables (saffron-like flavor) Vanilla Extract-Baked goods, candy Vinegar-Salads, vegetables, meat marinades Walnut Extract-baked goods, candy   2. Choose your Foods Wisely   The following is a list of foods to avoid which are high in sodium:  Meats-Avoid all smoked, canned, salt cured, dried and kosher meat and fish as well as Anchovies   [employer] meats:Bologna, Liverwurst, Pastrami Canned meat or fish  Marinated herring Caviar    Pepperoni Corned Beef   Pizza Dried chipped beef  Salami Frozen breaded fish or meat Salt pork Frankfurters or hot dogs  Sardines Gefilte fish   Sausage Ham (boiled ham, Proscuitto Smoked butt    spiced ham)   Spam      TV Dinners Vegetables Canned vegetables (Regular) Relish Canned mushrooms  Sauerkraut Olives    Tomato juice Pickles  Bakery and Dessert Products Canned puddings  Cream pies Cheesecake   Decorated cakes Cookies  Beverages/Juices Tomato juice, regular  Gatorade   V-8 vegetable juice, regular  Breads and Cereals Biscuit mixes   Salted potato chips, corn chips, pretzels Bread stuffing mixes  Salted crackers and rolls Pancake and waffle mixes Self-rising flour  Seasonings Accent    Meat sauces Barbecue sauce  Meat tenderizer Catsup    Monosodium glutamate (MSG) Celery salt   Onion salt Chili sauce   Prepared mustard Garlic salt   Salt, seasoned salt, sea salt Gravy mixes   Soy sauce Horseradish   Steak sauce Ketchup   Tartar sauce Lite salt    Teriyaki sauce Marinade mixes   Worcestershire sauce  Others Baking powder   Cocoa and cocoa mixes Baking soda   Commercial casserole mixes Candy-caramels, chocolate  Dehydrated soups    Bars, fudge,nougats  Instant rice and pasta mixes Canned broth or soup  Maraschino cherries Cheese, aged and processed cheese and cheese  spreads  Learning Assessment Quiz  Indicated T (for True) or F (for False) for each of the following statements:  _____ Fresh fruits and vegetables and unprocessed grains are generally low in sodium _____ Water may contain a considerable amount of sodium, depending on the source _____ You can always tell if a food is high in sodium by tasting it _____ Certain laxatives my be high in sodium and should be avoided unless prescribed   by a physician or pharmacist _____ Salt substitutes may be used freely by anyone on a sodium restricted diet _____ Sodium is present in table salt, food additives and as a natural component of   most foods _____ Table salt is approximately 90% sodium _____ Limiting sodium intake may help prevent excess fluid accumulation in the body _____ On a sodium-restricted diet, seasonings such as bouillon soy sauce, and    cooking wine should be used in place of table salt _____ On an ingredient list, a product which lists monosodium glutamate as the first   ingredient is an appropriate food to include on a low sodium diet  Circle the best answer(s) to the following statements (Hint: there may be more than one correct answer)  11. On a low-sodium diet, some acceptable snack items are:    A. Olives  F. Bean dip   K. Grapefruit juice    B. Salted Pretzels G. Commercial Popcorn   L. Canned peaches    C. Carrot Sticks  H. Bouillon   M. Unsalted nuts   D. [place] fries  I. Peanut butter crackers N. Salami   E. Sweet pickles J. Tomato Juice   O. Pizza  12.  Seasonings that may be used freely on a reduced - sodium diet include   A. Lemon wedges F.Monosodium glutamate K. Celery seed    B.Soysauce   G. Pepper   L. Mustard powder   C. Sea salt  H. Cooking wine  M. Onion flakes   D. Vinegar  E. Prepared horseradish N. Salsa   E. Sage   J. Worcestershire sauce  O. Chutney    Signed, Ermalinda Barrios, PA-C  02/24/2021 2:36 PM    [facility] [address], [place], [place]  [address] Phone: [phone]; Fax: [phone]

## 2021-02-24 ENCOUNTER — Ambulatory Visit: Payer: Medicare HMO | Admitting: Physician Assistant

## 2021-02-24 ENCOUNTER — Other Ambulatory Visit: Payer: Self-pay

## 2021-02-24 ENCOUNTER — Encounter: Payer: Self-pay | Admitting: Physician Assistant

## 2021-02-24 VITALS — BP 116/64 | HR 69 | Ht 67.0 in | Wt 164.4 lb

## 2021-02-24 DIAGNOSIS — I10 Essential (primary) hypertension: Secondary | ICD-10-CM

## 2021-02-24 DIAGNOSIS — I2510 Atherosclerotic heart disease of native coronary artery without angina pectoris: Secondary | ICD-10-CM

## 2021-02-24 DIAGNOSIS — E785 Hyperlipidemia, unspecified: Secondary | ICD-10-CM | POA: Diagnosis not present

## 2021-02-24 DIAGNOSIS — I255 Ischemic cardiomyopathy: Secondary | ICD-10-CM | POA: Diagnosis not present

## 2021-02-24 NOTE — Patient Instructions (Signed)
Medication Instructions:  Your physician recommends that you continue on your current medications as directed. Please refer to the Current Medication list given to you today.  *If you need a refill on your cardiac medications before your next appointment, please call your pharmacy*   Lab Work: NONE   If you have labs (blood work) drawn today and your tests are completely normal, you will receive your results only by: [place] (if you have MyChart) OR A paper copy in the mail If you have any lab test that is abnormal or we need to change your treatment, we will call you to review the results.   Testing/Procedures: NONE    Follow-Up: At [facility], you and your health needs are our priority.  As part of our continuing mission to provide you with exceptional heart care, we have created designated Provider Care Teams.  These Care Teams include your primary Cardiologist (physician) and Advanced Practice Providers (APPs -  Physician Assistants and Nurse Practitioners) who all work together to provide you with the care you need, when you need it.  We recommend signing up for the patient portal called "MyChart".  Sign up information is provided on this After Visit Summary.  MyChart is used to connect with patients for Virtual Visits (Telemedicine).  Patients are able to view lab/test results, encounter notes, upcoming appointments, etc.  Non-urgent messages can be sent to your provider as well.   To learn more about what you can do with MyChart, go to [email or web address].    Your next appointment:   6 month(s)  The format for your next appointment:   In Person  Provider:   With MD    Other Instructions Thank you for choosing [facility]!  Two Gram Sodium Diet 2000 mg  What is Sodium? Sodium is a mineral found naturally in many foods. The most significant source of sodium in the diet is table salt, which is about 40% sodium.  Processed, convenience, and  preserved foods also contain a large amount of sodium.  The body needs only 500 mg of sodium daily to function,  A normal diet provides more than enough sodium even if you do not use salt.  Why Limit Sodium? A build up of sodium in the body can cause thirst, increased blood pressure, shortness of breath, and water retention.  Decreasing sodium in the diet can reduce edema and risk of heart attack or stroke associated with high blood pressure.  Keep in mind that there are many other factors involved in these health problems.  Heredity, obesity, lack of exercise, cigarette smoking, stress and what you eat all play a role.  General Guidelines: Do not add salt at the table or in cooking.  One teaspoon of salt contains over 2 grams of sodium. Read food labels Avoid processed and convenience foods Ask your dietitian before eating any foods not dicussed in the menu planning guidelines Consult your physician if you wish to use a salt substitute or a sodium containing medication such as antacids.  Limit milk and milk products to 16 oz (2 cups) per day.  Shopping Hints: READ LABELS!! "Dietetic" does not necessarily mean low sodium. Salt and other sodium ingredients are often added to foods during processing.    Menu Planning Guidelines Food Group Choose More Often Avoid  Beverages (see also the milk group All fruit juices, low-sodium, salt-free vegetables juices, low-sodium carbonated beverages Regular vegetable or tomato juices, commercially softened water used for drinking or cooking  Breads  and Cereals Enriched white, wheat, rye and pumpernickel bread, hard rolls and dinner rolls; muffins, cornbread and waffles; most dry cereals, cooked cereal without added salt; unsalted crackers and breadsticks; low sodium or homemade bread crumbs Bread, rolls and crackers with salted tops; quick breads; instant hot cereals; pancakes; commercial bread stuffing; self-rising flower and biscuit mixes; regular bread  crumbs or cracker crumbs  Desserts and Sweets Desserts and sweets mad with mild should be within allowance Instant pudding mixes and cake mixes  Fats Butter or margarine; vegetable oils; unsalted salad dressings, regular salad dressings limited to 1 Tbs; light, sour and heavy cream Regular salad dressings containing bacon fat, bacon bits, and salt pork; snack dips made with instant soup mixes or processed cheese; salted nuts  Fruits Most fresh, frozen and canned fruits Fruits processed with salt or sodium-containing ingredient (some dried fruits are processed with sodium sulfites        Vegetables Fresh, frozen vegetables and low- sodium canned vegetables Regular canned vegetables, sauerkraut, pickled vegetables, and others prepared in brine; frozen vegetables in sauces; vegetables seasoned with ham, bacon or salt pork  Condiments, Sauces, Miscellaneous  Salt substitute with physician's approval; pepper, herbs, spices; vinegar, lemon or lime juice; hot pepper sauce; garlic powder, onion powder, low sodium soy sauce (1 Tbs.); low sodium condiments (ketchup, chili sauce, mustard) in limited amounts (1 tsp.) fresh ground horseradish; unsalted tortilla chips, pretzels, potato chips, popcorn, salsa (1/4 cup) Any seasoning made with salt including garlic salt, celery salt, onion salt, and seasoned salt; sea salt, rock salt, kosher salt; meat tenderizers; monosodium glutamate; mustard, regular soy sauce, barbecue, sauce, chili sauce, teriyaki sauce, steak sauce, Worcestershire sauce, and most flavored vinegars; canned gravy and mixes; regular condiments; salted snack foods, olives, picles, relish, horseradish sauce, catsup   Food preparation: Try these seasonings Meats:    Pork Sage, onion Serve with applesauce  Chicken Poultry seasoning, thyme, parsley Serve with cranberry sauce  Lamb Curry powder, rosemary, garlic, thyme Serve with mint sauce or jelly  Veal Marjoram, basil Serve with current jelly,  cranberry sauce  Beef Pepper, bay leaf Serve with dry mustard, unsalted chive butter  Fish [place] leaf, dill Serve with unsalted lemon butter, unsalted parsley butter  Vegetables:    Asparagus Lemon juice   Broccoli Lemon juice   Carrots Mustard dressing parsley, mint, nutmeg, glazed with unsalted butter and sugar   Green beans Marjoram, lemon juice, nutmeg,dill seed   Tomatoes Basil, marjoram, onion   Spice /blend for [employer]" 4 tsp ground thyme 1 tsp ground sage 3 tsp ground rosemary 4 tsp ground marjoram   Test your knowledge A product that says "Salt Free" may still contain sodium. True or False Garlic Powder and Hot Pepper Sauce an be used as alternative seasonings.True or False Processed foods have more sodium than fresh foods.  True or False Canned Vegetables have less sodium than froze True or False   WAYS TO DECREASE YOUR SODIUM INTAKE Avoid the use of added salt in cooking and at the table.  Table salt (and other prepared seasonings which contain salt) is probably one of the greatest sources of sodium in the diet.  Unsalted foods can gain flavor from the sweet, sour, and butter taste sensations of herbs and spices.  Instead of using salt for seasoning, try the following seasonings with the foods listed.  Remember: how you use them to enhance natural food flavors is limited only by your creativity... Allspice-Meat, fish, eggs, fruit, peas, red and yellow  vegetables Almond Extract-Fruit baked goods Anise Seed-Sweet breads, fruit, carrots, beets, cottage cheese, cookies (tastes like licorice) Basil-Meat, fish, eggs, vegetables, rice, vegetables salads, soups, sauces Bay Leaf-Meat, fish, stews, poultry Burnet-Salad, vegetables (cucumber-like flavor) Caraway Seed-Bread, cookies, cottage cheese, meat, vegetables, cheese, rice Cardamon-Baked goods, fruit, soups Celery Powder or seed-Salads, salad dressings, sauces, meatloaf, soup, bread.Do not use  celery salt Chervil-Meats,  salads, fish, eggs, vegetables, cottage cheese (parsley-like flavor) Chili Power-Meatloaf, chicken cheese, corn, eggplant, egg dishes Chives-Salads cottage cheese, egg dishes, soups, vegetables, sauces Cilantro-Salsa, casseroles Cinnamon-Baked goods, fruit, pork, lamb, chicken, carrots Cloves-Fruit, baked goods, fish, pot roast, green beans, beets, carrots Coriander-Pastry, cookies, meat, salads, cheese (lemon-orange flavor) Cumin-Meatloaf, fish,cheese, eggs, cabbage,fruit pie (caraway flavor) [employer], fruit, eggs, fish, poultry, cottage cheese, vegetables Dill Seed-Meat, cottage cheese, poultry, vegetables, fish, salads, bread Fennel Seed-Bread, cookies, apples, pork, eggs, fish, beets, cabbage, cheese, Licorice-like flavor Garlic-(buds or powder) Salads, meat, poultry, fish, bread, butter, vegetables, potatoes.Do not  use garlic salt Ginger-Fruit, vegetables, baked goods, meat, fish, poultry Horseradish Root-Meet, vegetables, butter Lemon Juice or Extract-Vegetables, fruit, tea, baked goods, fish salads Mace-Baked goods fruit, vegetables, fish, poultry (taste like nutmeg) Maple Extract-Syrups Marjoram-Meat, chicken, fish, vegetables, breads, green salads (taste like Sage) Mint-Tea, lamb, sherbet, vegetables, desserts, carrots, cabbage Mustard, Dry or Seed-Cheese, eggs, meats, vegetables, poultry Nutmeg-Baked goods, fruit, chicken, eggs, vegetables, desserts Onion Powder-Meat, fish, poultry, vegetables, cheese, eggs, bread, rice salads (Do not use   Onion salt) Orange Extract-Desserts, baked goods Oregano-Pasta, eggs, cheese, onions, pork, lamb, fish, chicken, vegetables, green salads Paprika-Meat, fish, poultry, eggs, cheese, vegetables Parsley Flakes-Butter, vegetables, meat fish, poultry, eggs, bread, salads (certain forms may   Contain sodium Pepper-Meat fish, poultry, vegetables, eggs Peppermint Extract-Desserts, baked goods Poppy Seed-Eggs, bread, cheese, fruit  dressings, baked goods, noodles, vegetables, cottage  [employer], poultry, meat, fish, cauliflower, turnips,eggs bread Saffron-Rice, bread, veal, chicken, fish, eggs Sage-Meat, fish, poultry, onions, eggplant, tomateos, pork, stews Savory-Eggs, salads, poultry, meat, rice, vegetables, soups, pork Tarragon-Meat, poultry, fish, eggs, butter, vegetables (licorice-like flavor)  Thyme-Meat, poultry, fish, eggs, vegetables, (clover-like flavor), sauces, soups Tumeric-Salads, butter, eggs, fish, rice, vegetables (saffron-like flavor) Vanilla Extract-Baked goods, candy Vinegar-Salads, vegetables, meat marinades Walnut Extract-baked goods, candy   2. Choose your Foods Wisely   The following is a list of foods to avoid which are high in sodium:  Meats-Avoid all smoked, canned, salt cured, dried and kosher meat and fish as well as Anchovies   [employer] meats:Bologna, Liverwurst, Pastrami Canned meat or fish  Marinated herring Caviar    Pepperoni Corned Beef   Pizza Dried chipped beef  Salami Frozen breaded fish or meat Salt pork Frankfurters or hot dogs  Sardines Gefilte fish   Sausage Ham (boiled ham, Proscuitto Smoked butt    spiced ham)   Spam      TV Dinners Vegetables Canned vegetables (Regular) Relish Canned mushrooms  Sauerkraut Olives    Tomato juice Pickles  Bakery and Dessert Products Canned puddings  Cream pies Cheesecake   Decorated cakes Cookies  Beverages/Juices Tomato juice, regular  Gatorade   V-8 vegetable juice, regular  Breads and Cereals Biscuit mixes   Salted potato chips, corn chips, pretzels Bread stuffing mixes  Salted crackers and rolls Pancake and waffle mixes Self-rising flour  Seasonings Accent    Meat sauces Barbecue sauce  Meat tenderizer Catsup    Monosodium glutamate (MSG) Celery salt   Onion salt Chili sauce   Prepared mustard Garlic salt   Salt,  seasoned salt, sea salt Gravy  mixes   Soy sauce Horseradish   Steak sauce Ketchup   Tartar sauce Lite salt    Teriyaki sauce Marinade mixes   Worcestershire sauce  Others Baking powder   Cocoa and cocoa mixes Baking soda   Commercial casserole mixes Candy-caramels, chocolate  Dehydrated soups    Bars, fudge,nougats  Instant rice and pasta mixes Canned broth or soup  Maraschino cherries Cheese, aged and processed cheese and cheese spreads  Learning Assessment Quiz  Indicated T (for True) or F (for False) for each of the following statements:  _____ Fresh fruits and vegetables and unprocessed grains are generally low in sodium _____ Water may contain a considerable amount of sodium, depending on the source _____ You can always tell if a food is high in sodium by tasting it _____ Certain laxatives my be high in sodium and should be avoided unless prescribed   by a physician or pharmacist _____ Salt substitutes may be used freely by anyone on a sodium restricted diet _____ Sodium is present in table salt, food additives and as a natural component of   most foods _____ Table salt is approximately 90% sodium _____ Limiting sodium intake may help prevent excess fluid accumulation in the body _____ On a sodium-restricted diet, seasonings such as bouillon soy sauce, and    cooking wine should be used in place of table salt _____ On an ingredient list, a product which lists monosodium glutamate as the first   ingredient is an appropriate food to include on a low sodium diet  Circle the best answer(s) to the following statements (Hint: there may be more than one correct answer)  11. On a low-sodium diet, some acceptable snack items are:    A. Olives  F. Bean dip   K. Grapefruit juice    B. Salted Pretzels G. Commercial Popcorn   L. Canned peaches    C. Carrot Sticks  H. Bouillon   M. Unsalted nuts   D. [place] fries  I. Peanut butter crackers N. Salami   E. Sweet pickles J. Tomato Juice   O. Pizza  12.  Seasonings  that may be used freely on a reduced - sodium diet include   A. Lemon wedges F.Monosodium glutamate K. Celery seed    B.Soysauce   G. Pepper   L. Mustard powder   C. Sea salt  H. Cooking wine  M. Onion flakes   D. Vinegar  E. Prepared horseradish N. Salsa   E. Sage   J. Worcestershire sauce  O. Chutney

## 2021-03-01 DIAGNOSIS — N1832 Chronic kidney disease, stage 3b: Secondary | ICD-10-CM | POA: Insufficient documentation

## 2021-03-01 DIAGNOSIS — F32A Depression, unspecified: Secondary | ICD-10-CM

## 2021-03-01 DIAGNOSIS — I509 Heart failure, unspecified: Secondary | ICD-10-CM

## 2021-03-01 DIAGNOSIS — F05 Delirium due to known physiological condition: Secondary | ICD-10-CM | POA: Insufficient documentation

## 2021-03-01 HISTORY — DX: Depression, unspecified: F32.A

## 2021-03-01 HISTORY — DX: Heart failure, unspecified: I50.9

## 2021-03-03 ENCOUNTER — Ambulatory Visit (HOSPITAL_COMMUNITY)
Admission: RE | Admit: 2021-03-03 | Discharge: 2021-03-03 | Disposition: A | Discharge status: Home / Self Care | Payer: Medicare HMO | Attending: Internal Medicine | Admitting: Internal Medicine

## 2021-03-03 ENCOUNTER — Encounter (HOSPITAL_COMMUNITY)
Admission: RE | Disposition: A | Discharge status: Home / Self Care | Payer: Self-pay | Source: Home / Self Care | Attending: Internal Medicine

## 2021-03-03 ENCOUNTER — Other Ambulatory Visit: Payer: Self-pay

## 2021-03-03 DIAGNOSIS — Z87891 Personal history of nicotine dependence: Secondary | ICD-10-CM | POA: Insufficient documentation

## 2021-03-03 DIAGNOSIS — E7800 Pure hypercholesterolemia, unspecified: Secondary | ICD-10-CM | POA: Insufficient documentation

## 2021-03-03 DIAGNOSIS — Z833 Family history of diabetes mellitus: Secondary | ICD-10-CM | POA: Insufficient documentation

## 2021-03-03 DIAGNOSIS — K209 Esophagitis, unspecified without bleeding: Secondary | ICD-10-CM

## 2021-03-03 DIAGNOSIS — K5730 Diverticulosis of large intestine without perforation or abscess without bleeding: Secondary | ICD-10-CM | POA: Diagnosis not present

## 2021-03-03 DIAGNOSIS — K449 Diaphragmatic hernia without obstruction or gangrene: Secondary | ICD-10-CM | POA: Insufficient documentation

## 2021-03-03 DIAGNOSIS — I252 Old myocardial infarction: Secondary | ICD-10-CM | POA: Diagnosis not present

## 2021-03-03 DIAGNOSIS — K2100 Gastro-esophageal reflux disease with esophagitis, without bleeding: Secondary | ICD-10-CM | POA: Diagnosis not present

## 2021-03-03 DIAGNOSIS — I130 Hypertensive heart and chronic kidney disease with heart failure and stage 1 through stage 4 chronic kidney disease, or unspecified chronic kidney disease: Secondary | ICD-10-CM | POA: Insufficient documentation

## 2021-03-03 DIAGNOSIS — Z86718 Personal history of other venous thrombosis and embolism: Secondary | ICD-10-CM | POA: Diagnosis not present

## 2021-03-03 DIAGNOSIS — I255 Ischemic cardiomyopathy: Secondary | ICD-10-CM | POA: Insufficient documentation

## 2021-03-03 DIAGNOSIS — K222 Esophageal obstruction: Secondary | ICD-10-CM | POA: Diagnosis not present

## 2021-03-03 DIAGNOSIS — I5042 Chronic combined systolic (congestive) and diastolic (congestive) heart failure: Secondary | ICD-10-CM | POA: Insufficient documentation

## 2021-03-03 DIAGNOSIS — Z7902 Long term (current) use of antithrombotics/antiplatelets: Secondary | ICD-10-CM | POA: Diagnosis not present

## 2021-03-03 DIAGNOSIS — N183 Chronic kidney disease, stage 3 unspecified: Secondary | ICD-10-CM | POA: Diagnosis not present

## 2021-03-03 DIAGNOSIS — Z79899 Other long term (current) drug therapy: Secondary | ICD-10-CM | POA: Insufficient documentation

## 2021-03-03 DIAGNOSIS — Z955 Presence of coronary angioplasty implant and graft: Secondary | ICD-10-CM | POA: Insufficient documentation

## 2021-03-03 DIAGNOSIS — R1310 Dysphagia, unspecified: Secondary | ICD-10-CM | POA: Insufficient documentation

## 2021-03-03 DIAGNOSIS — C9110 Chronic lymphocytic leukemia of B-cell type not having achieved remission: Secondary | ICD-10-CM | POA: Insufficient documentation

## 2021-03-03 DIAGNOSIS — I2510 Atherosclerotic heart disease of native coronary artery without angina pectoris: Secondary | ICD-10-CM | POA: Diagnosis not present

## 2021-03-03 HISTORY — PX: ESOPHAGOGASTRODUODENOSCOPY: SHX5428

## 2021-03-03 HISTORY — PX: MALONEY DILATION: SHX5535

## 2021-03-03 SURGERY — EGD (ESOPHAGOGASTRODUODENOSCOPY)
Anesthesia: Moderate Sedation

## 2021-03-03 MED ORDER — ONDANSETRON HCL 4 MG/2ML IJ SOLN
INTRAMUSCULAR | Status: DC | PRN
  Administered 2021-03-03: 4 mg via INTRAVENOUS

## 2021-03-03 MED ORDER — ONDANSETRON HCL 4 MG/2ML IJ SOLN
INTRAMUSCULAR | Status: AC
  Filled 2021-03-03: qty 2

## 2021-03-03 MED ORDER — LIDOCAINE VISCOUS HCL 2 % MT SOLN
OROMUCOSAL | Status: DC | PRN
  Administered 2021-03-03: 1 via OROMUCOSAL

## 2021-03-03 MED ORDER — MIDAZOLAM HCL 5 MG/5ML IJ SOLN
INTRAMUSCULAR | Status: DC | PRN
  Administered 2021-03-03 (×2): 1 mg via INTRAVENOUS

## 2021-03-03 MED ORDER — MEPERIDINE HCL 50 MG/ML IJ SOLN
INTRAMUSCULAR | Status: AC
  Filled 2021-03-03: qty 1

## 2021-03-03 MED ORDER — STERILE WATER FOR IRRIGATION IR SOLN
Status: DC | PRN
  Administered 2021-03-03: 100 mL

## 2021-03-03 MED ORDER — MIDAZOLAM HCL 5 MG/5ML IJ SOLN
INTRAMUSCULAR | Status: AC
  Filled 2021-03-03: qty 10

## 2021-03-03 MED ORDER — SODIUM CHLORIDE 0.9 % IV SOLN: INTRAVENOUS | Status: DC

## 2021-03-03 MED ORDER — MEPERIDINE HCL 100 MG/ML IJ SOLN
INTRAMUSCULAR | Status: DC | PRN
  Administered 2021-03-03: 25 mg via INTRAVENOUS
  Administered 2021-03-03: 15 mg via INTRAVENOUS

## 2021-03-03 MED ORDER — LIDOCAINE VISCOUS HCL 2 % MT SOLN
OROMUCOSAL | Status: AC
  Filled 2021-03-03: qty 15

## 2021-03-03 NOTE — Interval H&P Note (Signed)
History and Physical Interval Note:  03/03/2021 8:41 AM  [name]  has presented today for surgery, with the diagnosis of dysphagia.  The various methods of treatment have been discussed with the patient and family. After consideration of risks, benefits and other options for treatment, the patient has consented to  Procedure(s) with comments: ESOPHAGOGASTRODUODENOSCOPY (EGD) (N/A) - 12:00pm MALONEY DILATION (N/A) as a surgical intervention.  The patient's history has been reviewed, patient examined, no change in status, stable for surgery.  I have reviewed the patient's chart and labs.  Questions were answered to the patient's satisfaction.     Rjay Revolorio   Known Schatzki's ring on prior BPE.  Remains on Plavix.  I have offer the patient EGD with esophageal dilation per plan.  The risks, benefits, limitations, alternatives and imponderables have been reviewed with the patient. Potential for esophageal dilation, biopsy, etc. have also been reviewed.  Questions have been answered. All parties agreeable.

## 2021-03-03 NOTE — Op Note (Signed)
[facility] Patient Name: [name] Procedure Date: 03/03/2021 8:28 AM MRN: [id number] Date of Birth: 06/11/1945 Attending MD: Norvel Richards , MD CSN: [id number] Age: 75 Admit Type: Outpatient Procedure:                Upper GI endoscopy Indications:              Dysphagia Providers:                Norvel Richards, MD, Crystal Page, Aram Candela Referring MD:              Medicines:                Midazolam 2 mg IV, Meperidine 40 mg IV Complications:            No immediate complications. Estimated Blood Loss:     Estimated blood loss was minimal. Procedure:                Pre-Anesthesia Assessment:                           - Prior to the procedure, a History and Physical                            was performed, and patient medications and                            allergies were reviewed. The patient's tolerance of                            previous anesthesia was also reviewed. The risks                            and benefits of the procedure and the sedation                            options and risks were discussed with the patient.                            All questions were answered, and informed consent                            was obtained. Prior Anticoagulants: The patient has                            taken Plavix (clopidogrel), last dose was 1 day                            prior to procedure. ASA Grade Assessment: III - A                            patient with severe systemic disease. After                            reviewing the risks and benefits, the patient was  deemed in satisfactory condition to undergo the                            procedure.                           After obtaining informed consent, the endoscope was                            passed under direct vision. Throughout the                            procedure, the patient's blood pressure, pulse, and                            oxygen  saturations were monitored continuously. The                            GIF-H190 ([id number]) scope was introduced through the                            mouth, and advanced to the second part of duodenum.                            The upper GI endoscopy was accomplished without                            difficulty. The patient tolerated the procedure                            well. Scope In: 8:58:44 AM Scope Out: 9:03:56 AM Total Procedure Duration: 0 hours 5 minutes 12 seconds  Findings:      LA Grade B (one or more mucosal breaks greater than 5 mm, not extending       between the tops of two mucosal folds) esophagitis with no bleeding was       found. Schatzki's ring present. No Barrett's epithelium seen. No tumor       seen.      A small hiatal hernia was present.      The exam was otherwise without abnormality.      The duodenal bulb and second portion of the duodenum were normal. The       scope was withdrawn. Dilation was performed with a Maloney dilator with       mild resistance at 36 Fr. The dilation site was examined following       endoscope reinsertion and showed complete resolution of luminal       narrowing. Estimated blood loss was minimal. Impression:               - LA Grade B esophagitis with no bleeding.                            Schatzki's ring dilated.                           - Small hiatal hernia.                           -  The examination was otherwise normal.                           - Normal duodenal bulb and second portion of the                            duodenum.                           - No specimens collected. Moderate Sedation:      Moderate (conscious) sedation was administered by the endoscopy nurse       and supervised by the endoscopist. The following parameters were       monitored: oxygen saturation, heart rate, blood pressure, respiratory       rate, EKG, adequacy of pulmonary ventilation, and response to care.       Total physician  intraservice time was 11 minutes. Recommendation:           - Patient has a contact number available for                            emergencies. The signs and symptoms of potential                            delayed complications were discussed with the                            patient. Return to normal activities tomorrow.                            Written discharge instructions were provided to the                            patient.                           - Resume previous diet.                           - Continue present medications. Begin Protonix 40                            mg daily.                           - Return to my office in 1 year. Procedure Code(s):        --- Professional ---                           [phone], Esophagogastroduodenoscopy, flexible,                            transoral; diagnostic, including collection of                            specimen(s) by brushing or washing, when performed                            (  separate procedure)                           43450, Dilation of esophagus, by unguided sound or                            bougie, single or multiple passes                           G0500, Moderate sedation services provided by the                            same physician or other qualified health care                            professional performing a gastrointestinal                            endoscopic service that sedation supports,                            requiring the presence of an independent trained                            observer to assist in the monitoring of the                            patient's level of consciousness and physiological                            status; initial 15 minutes of intra-service time;                            patient age 82 years or older (additional time may                            be reported with [phone], as appropriate) Diagnosis Code(s):        --- Professional ---                            K20.90, Esophagitis, unspecified without bleeding                           K44.9, Diaphragmatic hernia without obstruction or                            gangrene                           R13.10, Dysphagia, unspecified CPT copyright 2019 American Medical Association. All rights reserved. The codes documented in this report are preliminary and upon coder review may  be revised to meet current compliance requirements. Cristopher Estimable. Cerinity Zynda, MD Norvel Richards, MD 03/03/2021 9:16:58 AM This report has been signed electronically. Number of Addenda: 0

## 2021-03-03 NOTE — Discharge Instructions (Addendum)
EGD Discharge instructions Please read the instructions outlined below and refer to this sheet in the next few weeks. These discharge instructions provide you with general information on caring for yourself after you leave the hospital. Your doctor may also give you specific instructions. While your treatment has been planned according to the most current medical practices available, unavoidable complications occasionally occur. If you have any problems or questions after discharge, please call your doctor. ACTIVITY You may resume your regular activity but move at a slower pace for the next 24 hours.  Take frequent rest periods for the next 24 hours.  Walking will help expel (get rid of) the air and reduce the bloated feeling in your abdomen.  No driving for 24 hours (because of the anesthesia (medicine) used during the test).  You may shower.  Do not sign any important legal documents or operate any machinery for 24 hours (because of the anesthesia used during the test).  NUTRITION Drink plenty of fluids.  You may resume your normal diet.  Begin with a light meal and progress to your normal diet.  Avoid alcoholic beverages for 24 hours or as instructed by your caregiver.  MEDICATIONS You may resume your normal medications unless your caregiver tells you otherwise.  WHAT YOU CAN EXPECT TODAY You may experience abdominal discomfort such as a feeling of fullness or "gas" pains.  FOLLOW-UP Your doctor will discuss the results of your test with you.  SEEK IMMEDIATE MEDICAL ATTENTION IF ANY OF THE FOLLOWING OCCUR: Excessive nausea (feeling sick to your stomach) and/or vomiting.  Severe abdominal pain and distention (swelling).  Trouble swallowing.  Temperature over 101 F (37.8 C).  Rectal bleeding or vomiting of blood.    You have acid irritation in your esophagus from reflux  Esophagus was stretched today  Information on GERD provided  Begin Protonix 40 mg once daily take  indefinitely.  New prescription provided  Office visit with [place] in 1 year  Patient request, I called Henrry Feil at [phone] -call rolled to voicemail.  Left a message.

## 2021-03-09 ENCOUNTER — Encounter (HOSPITAL_COMMUNITY): Payer: Self-pay | Admitting: Internal Medicine

## 2021-04-26 ENCOUNTER — Inpatient Hospital Stay (HOSPITAL_COMMUNITY): Payer: Medicare HMO | Attending: Hematology

## 2021-04-26 ENCOUNTER — Other Ambulatory Visit: Payer: Self-pay

## 2021-04-26 DIAGNOSIS — C9110 Chronic lymphocytic leukemia of B-cell type not having achieved remission: Secondary | ICD-10-CM

## 2021-04-26 DIAGNOSIS — N183 Chronic kidney disease, stage 3 unspecified: Secondary | ICD-10-CM | POA: Diagnosis not present

## 2021-04-26 DIAGNOSIS — Z87891 Personal history of nicotine dependence: Secondary | ICD-10-CM | POA: Diagnosis not present

## 2021-04-26 DIAGNOSIS — Z79899 Other long term (current) drug therapy: Secondary | ICD-10-CM | POA: Diagnosis not present

## 2021-04-26 DIAGNOSIS — D631 Anemia in chronic kidney disease: Secondary | ICD-10-CM | POA: Diagnosis not present

## 2021-04-26 DIAGNOSIS — Z86718 Personal history of other venous thrombosis and embolism: Secondary | ICD-10-CM | POA: Diagnosis not present

## 2021-04-26 DIAGNOSIS — D649 Anemia, unspecified: Secondary | ICD-10-CM

## 2021-04-26 DIAGNOSIS — D509 Iron deficiency anemia, unspecified: Secondary | ICD-10-CM | POA: Insufficient documentation

## 2021-04-26 DIAGNOSIS — D500 Iron deficiency anemia secondary to blood loss (chronic): Secondary | ICD-10-CM

## 2021-04-26 LAB — COMPREHENSIVE METABOLIC PANEL
ALT: 12 U/L (ref 0–44)
AST: 18 U/L (ref 15–41)
Albumin: 4.2 g/dL (ref 3.5–5.0)
Alkaline Phosphatase: 64 U/L (ref 38–126)
Anion gap: 6 (ref 5–15)
BUN: 20 mg/dL (ref 8–23)
CO2: 27 mmol/L (ref 22–32)
Calcium: 9.1 mg/dL (ref 8.9–10.3)
Chloride: 107 mmol/L (ref 98–111)
Creatinine, Ser: 1.54 mg/dL — ABNORMAL HIGH (ref 0.61–1.24)
GFR, Estimated: 46 mL/min — ABNORMAL LOW (ref >60)
Glucose, Bld: 133 mg/dL — ABNORMAL HIGH (ref 70–99)
Potassium: 3.7 mmol/L (ref 3.5–5.1)
Sodium: 140 mmol/L (ref 135–145)
Total Bilirubin: 1 mg/dL (ref 0.3–1.2)
Total Protein: 6.8 g/dL (ref 6.5–8.1)

## 2021-04-26 LAB — IRON AND TIBC
Iron: 88 ug/dL (ref 45–182)
Saturation Ratios: 28 % (ref 17.9–39.5)
TIBC: 316 ug/dL (ref 250–450)
UIBC: 228 ug/dL

## 2021-04-26 LAB — LACTATE DEHYDROGENASE: LDH: 143 U/L (ref 98–192)

## 2021-04-26 LAB — CBC WITH DIFFERENTIAL/PLATELET
Abs Immature Granulocytes: 0.03 10*3/uL (ref 0.00–0.07)
Basophils Absolute: 0.1 10*3/uL (ref 0.0–0.1)
Basophils Relative: 0 %
Eosinophils Absolute: 0.2 10*3/uL (ref 0.0–0.5)
Eosinophils Relative: 1 %
HCT: 49.8 % (ref 39.0–52.0)
Hemoglobin: 15.6 g/dL (ref 13.0–17.0)
Immature Granulocytes: 0 %
Lymphocytes Relative: 60 %
Lymphs Abs: 7.9 10*3/uL — ABNORMAL HIGH (ref 0.7–4.0)
MCH: 29.7 pg (ref 26.0–34.0)
MCHC: 31.3 g/dL (ref 30.0–36.0)
MCV: 94.7 fL (ref 80.0–100.0)
Monocytes Absolute: 0.5 10*3/uL (ref 0.1–1.0)
Monocytes Relative: 4 %
Neutro Abs: 4.7 10*3/uL (ref 1.7–7.7)
Neutrophils Relative %: 35 %
Platelets: 210 10*3/uL (ref 150–400)
RBC: 5.26 MIL/uL (ref 4.22–5.81)
RDW: 13.9 % (ref 11.5–15.5)
WBC: 13.4 10*3/uL — ABNORMAL HIGH (ref 4.0–10.5)
nRBC: 0 % (ref 0.0–0.2)

## 2021-04-26 LAB — FERRITIN: Ferritin: 112 ng/mL (ref 24–336)

## 2021-04-27 DIAGNOSIS — N1832 Chronic kidney disease, stage 3b: Secondary | ICD-10-CM | POA: Diagnosis not present

## 2021-04-27 DIAGNOSIS — D472 Monoclonal gammopathy: Secondary | ICD-10-CM | POA: Diagnosis not present

## 2021-04-27 DIAGNOSIS — I5043 Acute on chronic combined systolic (congestive) and diastolic (congestive) heart failure: Secondary | ICD-10-CM | POA: Diagnosis not present

## 2021-04-27 DIAGNOSIS — I959 Hypotension, unspecified: Secondary | ICD-10-CM | POA: Diagnosis not present

## 2021-05-01 NOTE — Progress Notes (Signed)
[name], [name] [id number]   CLINIC:  Medical Oncology/Hematology  PCP:  Celene Squibb, MD [address] [place] [address] [phone]   REASON FOR VISIT:  Follow-up for CLL, anemia, and history of DVT  PRIOR THERAPY: None  CURRENT THERAPY: Observation and intermittent Venofer  INTERVAL HISTORY:  [name] 75 y.o. male returns for routine follow-up of his CLL, anemia, and prior DVT.  He was last seen by [name] Abernethy PA-C on 12/23/2020.  At today's visit, he reports feeling fairly well.  No recent hospitalizations, surgeries, or changes in baseline health status.  He denies any new lumps or bumps.   He has not had any fever, chills, night sweats, or unintentional weight loss.   He has not had any extreme fatigue or frequent infections.   He denies symptoms of hyperviscosity such as headache, dizziness, tinnitus, or vision changes.   No early satiety or abdominal pain.   He denies any signs or symptoms of blood loss.   No chest pain, shortness of breath, syncope, or palpitations.    He continues to have chronic peripheral edema (1+ pitting edema equal bilaterally) without any acute changes.  No symptoms of pulmonary embolism such as dyspnea, chest pain, or hemoptysis.    He has 75% energy and 100% appetite. He endorses that he is maintaining a stable weight.    REVIEW OF SYSTEMS:  Review of Systems  Constitutional:  Positive for fatigue (energy 75%, which is patient's normal baseline). Negative for appetite change, chills, diaphoresis, fever and unexpected weight change.  HENT:   Negative for lump/mass and nosebleeds.   Eyes:  Negative for eye problems.  Respiratory:  Negative for cough, hemoptysis and shortness of breath.   Cardiovascular:  Negative for chest pain, leg swelling and palpitations.  Gastrointestinal:  Negative for abdominal pain, blood in stool, constipation, diarrhea, nausea and vomiting.   Genitourinary:  Negative for hematuria.   Skin: Negative.   Neurological:  Negative for dizziness, headaches and light-headedness.  Hematological:  Does not bruise/bleed easily.     PAST MEDICAL/SURGICAL HISTORY:  Past Medical History:  Diagnosis Date   Acute MI, inferolateral wall, initial episode of care ([facility]) 06/16/2015   99% dCx    BPH (benign prostatic hyperplasia) 04/08/2011   CAD S/P percutaneous coronary angioplasty 11/23/12; 06/16/15   a. NSTEMI 2014 with 2 overlapping mRCA lesions - Xience Xpedition DES 2.75 mm x 18 mm (3.0 mm). b. Inf-Lat STEMI 06/16/2015 - dCx Asp Thrombectomy & PCI 3.0 x 15 Xience drDES (3.6 mm).   Chronic combined systolic and diastolic CHF (congestive heart failure) (HCC)    Chronic headache    CKD (chronic kidney disease), stage III (HCC)    CLL (chronic lymphocytic leukemia) ([place]) 04/08/2011   Depression    Diverticulitis    DVT (deep venous thrombosis) ([place]) 2021   Left, no anticoagulation due to history of diverticular bleeding, IVC filter placed.   Gallstone    GERD (gastroesophageal reflux disease)    Hypercholesteremia    Ischemic cardiomyopathy    Echo 06/18/2015 EF 35-40% after lateral MI -> repeat echo 08/06/2015: EF 40-45% with basal-mid inferolateral akinesis and hypokinesis of the lateral wall.   NSTEMI (non-ST elevated myocardial infarction) ([facility]) 11/23/2012   NSTEMI 11/23/2012 RCA lesion - PCI with DES; moderate LAD disease   PTSD (post-traumatic stress disorder)    With Depression - since MI   Past Surgical History:  Procedure Laterality Date  APPENDECTOMY  1968-70   CARDIAC CATHETERIZATION N/A 06/16/2015   Procedure: Left Heart Cath and Coronary Angiography;  Surgeon: Jettie Booze, MD;  Location: [facility];  Service: Cardiovascular: 99% thrombotic dCx -> asp thrombectomy & PCI. RCA Stents Patent.    CARDIAC CATHETERIZATION N/A 06/16/2015   Procedure: Coronary Stent Intervention;  Surgeon: Jettie Booze, MD;   Location: [facility];  Service: Cardiovascular: PCI dCx = thrombectomy -> 3.0 x 15 Xience drug-eluting stent (3.6 mm)    COLONOSCOPY  2008   QQP:[id number] diverticula, diminutive polyp in the cecum, s/p bx Normal rectum. Tubular adenoma   COLONOSCOPY N/A 10/17/2012   Dr. Gala Romney: colonic diverticulosis, tubular adenoma, surveillance due May 2019   COLONOSCOPY N/A 02/21/2018   Procedure: COLONOSCOPY;  Surgeon: Daneil Dolin, MD; Diverticulosis of the sigmoid and descending colon, otherwise normal exam.  No recommendations to repeat.   CORONARY STENT PLACEMENT  11/23/2012   Mid RCA -- Xience eXp - 2.75 mm x 18 mm DES (3.96mm) , Dr. Ellyn Hack   ESOPHAGOGASTRODUODENOSCOPY N/A 03/03/2021   Procedure: ESOPHAGOGASTRODUODENOSCOPY (EGD);  Surgeon: Daneil Dolin, MD;  [facility];  Service: Endoscopy;  Laterality: N/A;  12:00pm   IR IVC FILTER PLMT / S&I /IMG GUID/MOD SED  05/27/2019   IR RADIOLOGIST EVAL & MGMT  09/11/2019   LEFT HEART CATHETERIZATION WITH CORONARY ANGIOGRAM N/A 11/23/2012   Procedure: LEFT HEART CATHETERIZATION WITH CORONARY ANGIOGRAM;  Surgeon: Leonie Man, MD;  Location: [facility];  Service: Cardiovascular: NSTEMI: Culprit = mid RCA 95% & 75%; LAD ~40-50%, Small RI ~60%; EF ~60%, mild inf-basal HK      MALONEY DILATION N/A 03/03/2021   Procedure: MALONEY DILATION;  Surgeon: Daneil Dolin, MD;  [facility];  Service: Endoscopy;  Laterality: N/A;   TRANSTHORACIC ECHOCARDIOGRAM  January 2017; March 2017   a. EF 35-40%. Entire inferior hypokinesis and akinesis of the basal and mid inferolateral and anterolateral /distal lateral walls;; b. EF 40 and 45%. Akinesis of basal-mid inferolateral wall. Hypokinesis of entire lateral wall.     SOCIAL HISTORY:  Social History   Socioeconomic History   Marital status: Married    Spouse name: Not on file   Number of children: Not on file   Years of education: Not on file   Highest education level: Not on  file  Occupational History   Not on file  Tobacco Use   Smoking status: Former    Packs/day: 0.25    Years: 2.00    Pack years: 0.50    Types: Cigarettes    Quit date: 06/07/1963    Years since quitting: 57.9   Smokeless tobacco: Never  Vaping Use   Vaping Use: Never used  Substance and Sexual Activity   Alcohol use: No    Alcohol/week: 0.0 standard drinks    Comment: Occasionally drinks red wine   Drug use: No   Sexual activity: Not on file  Other Topics Concern   Not on file  Social History Narrative   Exercises 3 to 4 days a week at the fitness center for about a half an hour at a time.   "former smoker". Does not drink EtOH   Married.   Social Determinants of Health   Financial Resource Strain: Not on file  Food Insecurity: Not on file  Transportation Needs: Not on file  Physical Activity: Not on file  Stress: Not on file  Social Connections: Not on file  Intimate  Partner Violence: Not on file    FAMILY HISTORY:  Family History  Problem Relation Age of Onset   Diabetes Mother    Cancer Father        prostate   Colon cancer Neg Hx    Esophageal cancer Neg Hx    Stomach cancer Neg Hx     CURRENT MEDICATIONS:  Outpatient Encounter Medications as of 05/03/2021  Medication Sig Note   atorvastatin (LIPITOR) 40 MG tablet Take 1 tablet (40 mg total) by mouth daily.    clopidogrel (PLAVIX) 75 MG tablet TAKE 1 TABLET BY MOUTH EVERY DAY    [employer] VITAMIN B12 1000 MCG TBCR TAKE 1 TABLET BY MOUTH EVERY DAY    furosemide (LASIX) 20 MG tablet TAKE 1 TABLET BY MOUTH EVERY DAY    lisinopril (ZESTRIL) 2.5 MG tablet TAKE 1 TABLET BY MOUTH EVERY DAY    nitroGLYCERIN (NITROSTAT) 0.4 MG SL tablet Place 0.4 mg under the tongue every 5 (five) minutes as needed for chest pain. 02/25/2021: Newly prescribed, hasnt picked up yet   No facility-administered encounter medications on file as of 05/03/2021.    ALLERGIES:  No Known Allergies   PHYSICAL EXAM:  ECOG PERFORMANCE STATUS: 1  - Symptomatic but completely ambulatory  There were no vitals filed for this visit. There were no vitals filed for this visit. Physical Exam Constitutional:      Appearance: Normal appearance. He is obese.  HENT:     Head: Normocephalic and atraumatic.     Mouth/Throat:     Mouth: Mucous membranes are moist.  Eyes:     Extraocular Movements: Extraocular movements intact.     Pupils: Pupils are equal, round, and reactive to light.  Cardiovascular:     Rate and Rhythm: Normal rate and regular rhythm.     Pulses: Normal pulses.     Heart sounds: Normal heart sounds.  Pulmonary:     Effort: Pulmonary effort is normal.     Breath sounds: Normal breath sounds.  Abdominal:     General: Bowel sounds are normal.     Palpations: Abdomen is soft. There is no hepatomegaly or splenomegaly.     Tenderness: There is no abdominal tenderness.  Musculoskeletal:        General: No swelling.     Right lower leg: No edema.     Left lower leg: No edema.  Lymphadenopathy:     Head:     Right side of head: No submental, submandibular, tonsillar, preauricular, posterior auricular or occipital adenopathy.     Left side of head: No submental, submandibular, tonsillar, preauricular or posterior auricular adenopathy.     Cervical: No cervical adenopathy.     Right cervical: No superficial, deep or posterior cervical adenopathy.    Left cervical: No superficial, deep or posterior cervical adenopathy.     Upper Body:     Right upper body: No supraclavicular, axillary, pectoral or epitrochlear adenopathy.     Left upper body: No supraclavicular, axillary, pectoral or epitrochlear adenopathy.     Lower Body: No right inguinal adenopathy. No left inguinal adenopathy.  Skin:    General: Skin is warm and dry.  Neurological:     General: No focal deficit present.     Mental Status: He is alert and oriented to person, place, and time.  Psychiatric:        Mood and Affect: Mood normal.        Behavior:  Behavior normal.     LABORATORY  DATA:  I have reviewed the labs as listed.  CBC    Component Value Date/Time   WBC 13.4 (H) 04/26/2021 1059   RBC 5.26 04/26/2021 1059   HGB 15.6 04/26/2021 1059   HCT 49.8 04/26/2021 1059   PLT 210 04/26/2021 1059   MCV 94.7 04/26/2021 1059   MCH 29.7 04/26/2021 1059   MCHC 31.3 04/26/2021 1059   RDW 13.9 04/26/2021 1059   LYMPHSABS 7.9 (H) 04/26/2021 1059   MONOABS 0.5 04/26/2021 1059   EOSABS 0.2 04/26/2021 1059   BASOSABS 0.1 04/26/2021 1059   CMP Latest Ref Rng & Units 04/26/2021 12/16/2020 11/18/2020  Glucose 70 - 99 mg/dL 133(H) 149(H) 119  BUN 8 - 23 mg/dL 20 23 18   Creatinine 0.61 - 1.24 mg/dL 1.54(H) 1.53(H) 1.59(H)  Sodium 135 - 145 mmol/L 140 138 140  Potassium 3.5 - 5.1 mmol/L 3.7 3.9 4.1  Chloride 98 - 111 mmol/L 107 106 108  CO2 22 - 32 mmol/L 27 24 24   Calcium 8.9 - 10.3 mg/dL 9.1 9.2 9.3  Total Protein 6.5 - 8.1 g/dL 6.8 6.5 6.3  Total Bilirubin 0.3 - 1.2 mg/dL 1.0 1.6(H) 1.0  Alkaline Phos 38 - 126 U/L 64 53 -  AST 15 - 41 U/L 18 22 17   ALT 0 - 44 U/L 12 15 9     DIAGNOSTIC IMAGING:  I have independently reviewed the relevant imaging and discussed with the patient.  ASSESSMENT & PLAN: 1.  Chronic lymphocytic anemia (Rai stage 0, Binet stage A) - Originally diagnosed in 2008, has been on watchful waiting since then with expected waxing and waning of his CLL - No fevers, night sweats, weight loss, or extreme fatigue.  No recurrent infections.   - No splenomegaly noted on CT abdomen/pelvis on 09/07/2017 - No lymphadenopathy or hepatosplenomegaly appreciated on exam  - Most recent labs (04/26/2021): WBC 13.4 with absolute lymphocytes 7.9, mildly increased from previous.  LDH normal at 143. - PLAN: Repeat labs and RTC in 4 months.  No indication for treatment at this time.   2.  History of provoked DVT  - DVT of left lower extremity during hospitalization for COVID-19 infection in December 2020 - Anticoagulation was not  started due to diverticular GI bleed at that time - IVC filter placed on 05/27/2019 - per note by interventional radiologist (Dr. Jacqulynn Cadet) on 09/11/2019, IVC filter is to be considered a permanent device - Most recent venous duplex ultrasound (09/11/2019): Chronic thrombus of left common femoral, femoral, profunda femoral, and popliteal veins. - Not on anticoagulation at this time (although he does take Plavix for CAD with stents)  - PLAN: Stable at this time with IVC filter in place  3.  Normocytic anemia - Intermittent anemia from iron deficiency and mild CKD - Received Venofer x5 doses from 07/23/2019 through 08/05/2019 - Most recent labs (04/26/2021): Hgb normal at 15.6 with normal ferritin 112 and iron saturation 28 % - PLAN: Stable at this time, repeat labs and RTC in 4 months   4.  CKD stage III - Most recent creatinine (04/26/2021): 1.54, stable at baseline, with GFR 46 - Was referred to nephrologist at prior visit, now sees Dr. Theador Hawthorne - PLAN: Continue follow-up with Dr. Theador Hawthorne   PLAN SUMMARY & DISPOSITION: Labs and RTC in 4 months  All questions were answered. The patient knows to call the clinic with any problems, questions or concerns.  Medical decision making: Moderate  Time spent on visit: I spent 20 minutes counseling the  patient face to face. The total time spent in the appointment was 30 minutes and more than 50% was on counseling.   Harriett Rush, PA-C  05/03/2021 12:00 PM

## 2021-05-03 ENCOUNTER — Other Ambulatory Visit: Payer: Self-pay

## 2021-05-03 ENCOUNTER — Inpatient Hospital Stay (HOSPITAL_COMMUNITY): Payer: Medicare HMO | Admitting: Physician Assistant

## 2021-05-03 VITALS — BP 134/79 | HR 56 | Temp 96.8°F | Resp 18 | Wt 173.5 lb

## 2021-05-03 DIAGNOSIS — C9110 Chronic lymphocytic leukemia of B-cell type not having achieved remission: Secondary | ICD-10-CM

## 2021-05-03 DIAGNOSIS — D649 Anemia, unspecified: Secondary | ICD-10-CM

## 2021-05-03 NOTE — Patient Instructions (Signed)
[place] at [facility] Discharge Instructions  You were seen today by Tarri Abernethy PA-C for your CLL (chronic lymphocytic leukemia).  This is a type of slow-growing leukemia (blood cancer).  Right now, you are not having any symptoms of your leukemia and your blood counts are stable.  You do not require any treatment at this time, but we will continue to monitor you periodically to check your blood counts, symptoms, and physical exam.  Your anemia is also stable.  You do not have any concerning low blood counts at this time.  LABS: Return in 4 months for repeat labs  OTHER TESTS: None  MEDICATIONS: No changes to home medication  FOLLOW-UP APPOINTMENT: Office visit in 4 months - If you need to be seen sooner than 4 months, please call our office.  Be on the look out for "alarm symptoms" such as unexplained fever, chills, night sweats, unintentional weight loss.  If any of these occur, please call our office to schedule an appointment.   Thank you for choosing [place] at [facility] to provide your oncology and hematology care.  To afford each patient quality time with our provider, please arrive at least 15 minutes before your scheduled appointment time.   If you have a lab appointment with the [facility] please come in thru the Main Entrance and check in at the main information desk.  You need to re-schedule your appointment should you arrive 10 or more minutes late.  We strive to give you quality time with our providers, and arriving late affects you and other patients whose appointments are after yours.  Also, if you no show three or more times for appointments you may be dismissed from the clinic at the providers discretion.     Again, thank you for choosing [facility].  Our hope is that these requests will decrease the amount of time that you wait before being seen by our physicians.        _____________________________________________________________  Should you have questions after your visit to [facility], please contact our office at [phone] and follow the prompts.  Our office hours are 8:00 a.m. and 4:30 p.m. Monday - Friday.  Please note that voicemails left after 4:00 p.m. may not be returned until the following business day.  We are closed weekends and major holidays.  You do have access to a nurse 24-7, just call the main number to the clinic [phone] and do not press any options, hold on the line and a nurse will answer the phone.    For prescription refill requests, have your pharmacy contact our office and allow 72 hours.    Due to Covid, you will need to wear a mask upon entering the hospital. If you do not have a mask, a mask will be given to you at the Main Entrance upon arrival. For doctor visits, patients may have 1 support person age 6 or older with them. For treatment visits, patients can not have anyone with them due to social distancing guidelines and our immunocompromised population.

## 2021-07-28 DIAGNOSIS — I129 Hypertensive chronic kidney disease with stage 1 through stage 4 chronic kidney disease, or unspecified chronic kidney disease: Secondary | ICD-10-CM | POA: Diagnosis not present

## 2021-07-28 DIAGNOSIS — D472 Monoclonal gammopathy: Secondary | ICD-10-CM | POA: Diagnosis not present

## 2021-07-28 DIAGNOSIS — I5043 Acute on chronic combined systolic (congestive) and diastolic (congestive) heart failure: Secondary | ICD-10-CM | POA: Diagnosis not present

## 2021-07-28 DIAGNOSIS — N1832 Chronic kidney disease, stage 3b: Secondary | ICD-10-CM | POA: Diagnosis not present

## 2021-08-16 ENCOUNTER — Other Ambulatory Visit (INDEPENDENT_AMBULATORY_CARE_PROVIDER_SITE_OTHER): Payer: Self-pay | Admitting: Nurse Practitioner

## 2021-08-16 DIAGNOSIS — E785 Hyperlipidemia, unspecified: Secondary | ICD-10-CM

## 2021-08-24 ENCOUNTER — Other Ambulatory Visit: Payer: Self-pay | Admitting: Internal Medicine

## 2021-08-24 NOTE — Telephone Encounter (Signed)
Last office visit 02/15/21 ?

## 2021-08-28 DIAGNOSIS — N1832 Chronic kidney disease, stage 3b: Secondary | ICD-10-CM | POA: Diagnosis not present

## 2021-08-28 DIAGNOSIS — I5042 Chronic combined systolic (congestive) and diastolic (congestive) heart failure: Secondary | ICD-10-CM | POA: Diagnosis not present

## 2021-08-28 DIAGNOSIS — D472 Monoclonal gammopathy: Secondary | ICD-10-CM | POA: Diagnosis not present

## 2021-08-28 DIAGNOSIS — I129 Hypertensive chronic kidney disease with stage 1 through stage 4 chronic kidney disease, or unspecified chronic kidney disease: Secondary | ICD-10-CM | POA: Diagnosis not present

## 2021-09-06 ENCOUNTER — Inpatient Hospital Stay (HOSPITAL_COMMUNITY): Payer: Medicare HMO | Attending: Hematology

## 2021-09-06 DIAGNOSIS — Z79899 Other long term (current) drug therapy: Secondary | ICD-10-CM | POA: Diagnosis not present

## 2021-09-06 DIAGNOSIS — N183 Chronic kidney disease, stage 3 unspecified: Secondary | ICD-10-CM | POA: Insufficient documentation

## 2021-09-06 DIAGNOSIS — Z86718 Personal history of other venous thrombosis and embolism: Secondary | ICD-10-CM | POA: Insufficient documentation

## 2021-09-06 DIAGNOSIS — D649 Anemia, unspecified: Secondary | ICD-10-CM | POA: Insufficient documentation

## 2021-09-06 DIAGNOSIS — Z87891 Personal history of nicotine dependence: Secondary | ICD-10-CM | POA: Insufficient documentation

## 2021-09-06 DIAGNOSIS — Z7902 Long term (current) use of antithrombotics/antiplatelets: Secondary | ICD-10-CM | POA: Diagnosis not present

## 2021-09-06 DIAGNOSIS — C9110 Chronic lymphocytic leukemia of B-cell type not having achieved remission: Secondary | ICD-10-CM | POA: Insufficient documentation

## 2021-09-06 LAB — COMPREHENSIVE METABOLIC PANEL
ALT: 11 U/L (ref 0–44)
AST: 17 U/L (ref 15–41)
Albumin: 3.9 g/dL (ref 3.5–5.0)
Alkaline Phosphatase: 57 U/L (ref 38–126)
Anion gap: 7 (ref 5–15)
BUN: 17 mg/dL (ref 8–23)
CO2: 25 mmol/L (ref 22–32)
Calcium: 9.1 mg/dL (ref 8.9–10.3)
Chloride: 107 mmol/L (ref 98–111)
Creatinine, Ser: 1.91 mg/dL — ABNORMAL HIGH (ref 0.61–1.24)
GFR, Estimated: 36 mL/min — ABNORMAL LOW (ref >60)
Glucose, Bld: 117 mg/dL — ABNORMAL HIGH (ref 70–99)
Potassium: 4.5 mmol/L (ref 3.5–5.1)
Sodium: 139 mmol/L (ref 135–145)
Total Bilirubin: 1.3 mg/dL — ABNORMAL HIGH (ref 0.3–1.2)
Total Protein: 6.6 g/dL (ref 6.5–8.1)

## 2021-09-06 LAB — CBC WITH DIFFERENTIAL/PLATELET
Abs Immature Granulocytes: 0.06 10*3/uL (ref 0.00–0.07)
Basophils Absolute: 0.1 10*3/uL (ref 0.0–0.1)
Basophils Relative: 0 %
Eosinophils Absolute: 0.2 10*3/uL (ref 0.0–0.5)
Eosinophils Relative: 1 %
HCT: 50 % (ref 39.0–52.0)
Hemoglobin: 15.4 g/dL (ref 13.0–17.0)
Immature Granulocytes: 0 %
Lymphocytes Relative: 63 %
Lymphs Abs: 9.7 10*3/uL — ABNORMAL HIGH (ref 0.7–4.0)
MCH: 28.4 pg (ref 26.0–34.0)
MCHC: 30.8 g/dL (ref 30.0–36.0)
MCV: 92.3 fL (ref 80.0–100.0)
Monocytes Absolute: 0.5 10*3/uL (ref 0.1–1.0)
Monocytes Relative: 3 %
Neutro Abs: 5.2 10*3/uL (ref 1.7–7.7)
Neutrophils Relative %: 33 %
Platelets: 163 10*3/uL (ref 150–400)
RBC: 5.42 MIL/uL (ref 4.22–5.81)
RDW: 14.3 % (ref 11.5–15.5)
WBC: 15.8 10*3/uL — ABNORMAL HIGH (ref 4.0–10.5)
nRBC: 0.1 % (ref 0.0–0.2)

## 2021-09-06 LAB — IRON AND TIBC
Iron: 82 ug/dL (ref 45–182)
Saturation Ratios: 27 % (ref 17.9–39.5)
TIBC: 299 ug/dL (ref 250–450)
UIBC: 217 ug/dL

## 2021-09-06 LAB — LACTATE DEHYDROGENASE: LDH: 220 U/L — ABNORMAL HIGH (ref 98–192)

## 2021-09-06 LAB — FERRITIN: Ferritin: 84 ng/mL (ref 24–336)

## 2021-09-10 NOTE — Progress Notes (Deleted)
NO SHOW

## 2021-09-13 ENCOUNTER — Inpatient Hospital Stay (HOSPITAL_COMMUNITY): Payer: Medicare HMO | Admitting: Physician Assistant

## 2021-09-14 NOTE — Progress Notes (Signed)
Cardiology Office Note    Date:  09/22/2021   ID:  [name], DOB Oct 10, 1945, MRN [id number]   PCP:  Celene Squibb, [place]  Cardiologist:  Glenetta Hew, MD   Advanced Practice Provider:  No care team member to display Electrophysiologist:  None   [phone]   No chief complaint on file.   History of Present Illness:  [name] is a 76 y.o. male with history of CAD status post NSTEMI treated with DES to the RCA 2014, inferior lateral STEMI 2017 DES to the circumflex , ischemic cardiomyopathy EF 40 to 45% hypertension, HLD, former tobacco abuse,   Patient last saw Dr. Ellyn Hack 07/14/2020 and was doing well.  He was having memory issues and some sundowning.  Consider converting to rosuvastatin.  I saw patient 02/24/21 and ws having trouble with dysphagia.  Patient comes in with wife. Has chronic LE edema. Getting extra salt-canned soup/bologna, fast food. No chest pain, dyspnea, dizziness. Walks 20-30 min twice a week.   Past Medical History:  Diagnosis Date   Acute MI, inferolateral wall, initial episode of care ([place]) 06/16/2015   99% dCx    BPH (benign prostatic hyperplasia) 04/08/2011   CAD S/P percutaneous coronary angioplasty 11/23/12; 06/16/15   a. NSTEMI 2014 with 2 overlapping mRCA lesions - Xience Xpedition DES 2.75 mm x 18 mm (3.0 mm). b. Inf-Lat STEMI 06/16/2015 - dCx Asp Thrombectomy & PCI 3.0 x 15 Xience drDES (3.6 mm).   Chronic combined systolic and diastolic CHF (congestive heart failure) (HCC)    Chronic headache    CKD (chronic kidney disease), stage III (HCC)    CLL (chronic lymphocytic leukemia) ([place]) 04/08/2011   Depression    Diverticulitis    DVT (deep venous thrombosis) ([place]) 2021   Left, no anticoagulation due to history of diverticular bleeding, IVC filter placed.   Gallstone    GERD (gastroesophageal reflux disease)    Hypercholesteremia    Ischemic cardiomyopathy    Echo 06/18/2015 EF 35-40% after lateral MI  -> repeat echo 08/06/2015: EF 40-45% with basal-mid inferolateral akinesis and hypokinesis of the lateral wall.   NSTEMI (non-ST elevated myocardial infarction) ([place]) 11/23/2012   NSTEMI 11/23/2012 RCA lesion - PCI with DES; moderate LAD disease   PTSD (post-traumatic stress disorder)    With Depression - since MI    Past Surgical History:  Procedure Laterality Date   APPENDECTOMY  1968-70   CARDIAC CATHETERIZATION N/A 06/16/2015   Procedure: Left Heart Cath and Coronary Angiography;  Surgeon: Jettie Booze, MD;  Location: [facility];  Service: Cardiovascular: 99% thrombotic dCx -> asp thrombectomy & PCI. RCA Stents Patent.    CARDIAC CATHETERIZATION N/A 06/16/2015   Procedure: Coronary Stent Intervention;  Surgeon: Jettie Booze, MD;  Location: [facility];  Service: Cardiovascular: PCI dCx = thrombectomy -> 3.0 x 15 Xience drug-eluting stent (3.6 mm)    COLONOSCOPY  2008   LEX:[id number] diverticula, diminutive polyp in the cecum, s/p bx Normal rectum. Tubular adenoma   COLONOSCOPY N/A 10/17/2012   Dr. Gala Romney: colonic diverticulosis, tubular adenoma, surveillance due May 2019   COLONOSCOPY N/A 02/21/2018   Procedure: COLONOSCOPY;  Surgeon: Daneil Dolin, MD; Diverticulosis of the sigmoid and descending colon, otherwise normal exam.  No recommendations to repeat.   CORONARY STENT PLACEMENT  11/23/2012   Mid RCA -- Xience eXp - 2.75 mm x 18 mm DES (3.46m) , Dr. HEllyn Hack  ESOPHAGOGASTRODUODENOSCOPY  N/A 03/03/2021   Procedure: ESOPHAGOGASTRODUODENOSCOPY (EGD);  Surgeon: Daneil Dolin, MD;  [facility];  Service: Endoscopy;  Laterality: N/A;  12:00pm   IR IVC FILTER PLMT / S&I /IMG GUID/MOD SED  05/27/2019   IR RADIOLOGIST EVAL & MGMT  09/11/2019   LEFT HEART CATHETERIZATION WITH CORONARY ANGIOGRAM N/A 11/23/2012   Procedure: LEFT HEART CATHETERIZATION WITH CORONARY ANGIOGRAM;  Surgeon: Leonie Man, MD;  Location: [facility];  Service:  Cardiovascular: NSTEMI: Culprit = mid RCA 95% & 75%; LAD ~40-50%, Small RI ~60%; EF ~60%, mild inf-basal HK      MALONEY DILATION N/A 03/03/2021   Procedure: MALONEY DILATION;  Surgeon: Daneil Dolin, MD;  [facility];  Service: Endoscopy;  Laterality: N/A;   TRANSTHORACIC ECHOCARDIOGRAM  January 2017; March 2017   a. EF 35-40%. Entire inferior hypokinesis and akinesis of the basal and mid inferolateral and anterolateral /distal lateral walls;; b. EF 40 and 45%. Akinesis of basal-mid inferolateral wall. Hypokinesis of entire lateral wall.    Current Medications: Current Meds  Medication Sig   atorvastatin (LIPITOR) 40 MG tablet Take 1 tablet (40 mg total) by mouth daily.   clopidogrel (PLAVIX) 75 MG tablet TAKE 1 TABLET BY MOUTH EVERY DAY   [facility] VITAMIN B12 1000 MCG TBCR TAKE 1 TABLET BY MOUTH EVERY DAY   furosemide (LASIX) 20 MG tablet TAKE 1 TABLET BY MOUTH EVERY DAY   lisinopril (ZESTRIL) 2.5 MG tablet TAKE 1 TABLET BY MOUTH EVERY DAY   pantoprazole (PROTONIX) 40 MG tablet TAKE 1 TABLET BY MOUTH EVERY DAY     Allergies:   Patient has no known allergies.   Social History   Socioeconomic History   Marital status: Married    Spouse name: Not on file   Number of children: Not on file   Years of education: Not on file   Highest education level: Not on file  Occupational History   Not on file  Tobacco Use   Smoking status: Former    Packs/day: 0.25    Years: 2.00    Pack years: 0.50    Types: Cigarettes    Quit date: 06/07/1963    Years since quitting: 58.3   Smokeless tobacco: Never  Vaping Use   Vaping Use: Never used  Substance and Sexual Activity   Alcohol use: No    Alcohol/week: 0.0 standard drinks    Comment: Occasionally drinks red wine   Drug use: No   Sexual activity: Not on file  Other Topics Concern   Not on file  Social History Narrative   Exercises 3 to 4 days a week at the fitness center for about a half an hour at a time.   "former smoker".  Does not drink EtOH   Married.   Social Determinants of Health   Financial Resource Strain: Not on file  Food Insecurity: Not on file  Transportation Needs: Not on file  Physical Activity: Not on file  Stress: Not on file  Social Connections: Not on file     Family History:  The patient's  family history includes Cancer in his father; Diabetes in his mother.   ROS:   Please see the history of present illness.    ROS All other systems reviewed and are negative.   PHYSICAL EXAM:   VS:  BP (!) 104/58   Pulse 60   Wt 173 lb (78.5 kg)   SpO2 98%   BMI 27.10 kg/m   Physical Exam  GEN: Well nourished, well developed, in no acute distress  Neck: no JVD, carotid bruits, or masses Cardiac:RRR; 2/6 systolic murmur apex 1/6 diastolic murmur LSB Respiratory:  clear to auscultation bilaterally, normal work of breathing GI: soft, nontender, nondistended, + BS Ext: plus 1 edema bilaterally  otherwise without cyanosis, clubbing,  Good distal pulses bilaterally Neuro:  Alert and Oriented x 3,  Psych: euthymic mood, full affect  Wt Readings from Last 3 Encounters:  09/22/21 173 lb (78.5 kg)  09/17/21 175 lb 11.3 oz (79.7 kg)  05/03/21 173 lb 8 oz (78.7 kg)      Studies/Labs Reviewed:   EKG:  EKG is  ordered today.  The ekg ordered today demonstrates  NSR with PVC's LVH inf and antlat infarct  Recent Labs: 09/06/2021: ALT 11; BUN 17; Creatinine, Ser 1.91; Hemoglobin 15.4; Platelets 163; Potassium 4.5; Sodium 139   Lipid Panel    Component Value Date/Time   CHOL 133 11/18/2020 1104   CHOL 129 12/14/2016 1007   TRIG 140 11/18/2020 1104   HDL 28 (L) 11/18/2020 1104   HDL 32 (L) 12/14/2016 1007   CHOLHDL 4.8 11/18/2020 1104   VLDL 24 06/16/2015 1425   [place] 81 11/18/2020 1104    Additional studies/ records that were reviewed today include:  2D echo 2017 Study Conclusions   - Left ventricle: The cavity size was normal. Wall thickness was    normal. Systolic function was  mildly to moderately reduced. The    estimated ejection fraction was in the range of 40% to 45%. There    is akinesis of the basal-midinferolateral myocardium. There is    hypokinesis of the entirelateral myocardium. Doppler parameters    are consistent with abnormal left ventricular relaxation (grade 1    diastolic dysfunction).  - Mitral valve: There was mild regurgitation.   Transthoracic echocardiography.  M-mode, complete 2D, spectral  Doppler, and color Doppler.  Birthdate:  Patient birthdate:  [birth year].  Age:  Patient is 76 yr old.  Sex:  Gender: male.  BMI: 26.1 kg/m^2.  Blood pressure:     152/69  Patient status:  Outpatient.  Study date:  Study date: 08/06/2015. Study time: 01:15  PM.  Location:  [facility]      Risk Assessment/Calculations:         ASSESSMENT:    1. Coronary artery disease involving native coronary artery of native heart without angina pectoris   2. Ischemic cardiomyopathy   3. Edema, unspecified type   4. Essential hypertension   5. Hyperlipidemia, unspecified hyperlipidemia type      PLAN:  In order of problems listed above:  CAD as listed above, no angina, no bleeding problems on Plavix. CBC stable earlier this month   Ischemic cardiomyopathy EF 40 to 45%-some lower ext edema. Getting extra salt in his diet. Low sodium diet. Manintained on low does lisinopril and lasix. Crt 1.591 09/06/21-up from 1.54. recheck echo and bmet-may need to stop lisinopril-BP low as well   Hypertension  Bp runs low   HLD LDL 81 on lipitor 40 mg daily. Memory loss, will not change      Shared Decision Making/Informed Consent        Medication Adjustments/Labs and Tests Ordered: Current medicines are reviewed at length with the patient today.  Concerns regarding medicines are outlined above.  Medication changes, Labs and Tests ordered today are listed in the Patient Instructions below. Patient Instructions  Labwork: BMET- Complete with  Echo  Testing/Procedures: Your physician  has requested that you have an echocardiogram. Echocardiography is a painless test that uses sound waves to create images of your heart. It provides your doctor with information about the size and shape of your heart and how well your heart's chambers and valves are working. This procedure takes approximately one hour. There are no restrictions for this procedure.   Follow-Up: Follow up in 1 year with Dr. Ellyn Hack.   Any Other Special Instructions Will Be Listed Below (If Applicable).     If you need a refill on your cardiac medications before your next appointment, please call your pharmacy.   Two Gram Sodium Diet 2000 mg  What is Sodium? Sodium is a mineral found naturally in many foods. The most significant source of sodium in the diet is table salt, which is about 40% sodium.  Processed, convenience, and preserved foods also contain a large amount of sodium.  The body needs only 500 mg of sodium daily to function,  A normal diet provides more than enough sodium even if you do not use salt.  Why Limit Sodium? A build up of sodium in the body can cause thirst, increased blood pressure, shortness of breath, and water retention.  Decreasing sodium in the diet can reduce edema and risk of heart attack or stroke associated with high blood pressure.  Keep in mind that there are many other factors involved in these health problems.  Heredity, obesity, lack of exercise, cigarette smoking, stress and what you eat all play a role.  General Guidelines: Do not add salt at the table or in cooking.  One teaspoon of salt contains over 2 grams of sodium. Read food labels Avoid processed and convenience foods Ask your dietitian before eating any foods not dicussed in the menu planning guidelines Consult your physician if you wish to use a salt substitute or a sodium containing medication such as antacids.  Limit milk and milk products to 16 oz (2 cups) per  day.  Shopping Hints: READ LABELS!! "Dietetic" does not necessarily mean low sodium. Salt and other sodium ingredients are often added to foods during processing.    Menu Planning Guidelines Food Group Choose More Often Avoid  Beverages (see also the milk group All fruit juices, low-sodium, salt-free vegetables juices, low-sodium carbonated beverages Regular vegetable or tomato juices, commercially softened water used for drinking or cooking  Breads and Cereals Enriched white, wheat, rye and pumpernickel bread, hard rolls and dinner rolls; muffins, cornbread and waffles; most dry cereals, cooked cereal without added salt; unsalted crackers and breadsticks; low sodium or homemade bread crumbs Bread, rolls and crackers with salted tops; quick breads; instant hot cereals; pancakes; commercial bread stuffing; self-rising flower and biscuit mixes; regular bread crumbs or cracker crumbs  Desserts and Sweets Desserts and sweets mad with mild should be within allowance Instant pudding mixes and cake mixes  Fats Butter or margarine; vegetable oils; unsalted salad dressings, regular salad dressings limited to 1 Tbs; light, sour and heavy cream Regular salad dressings containing bacon fat, bacon bits, and salt pork; snack dips made with instant soup mixes or processed cheese; salted nuts  Fruits Most fresh, frozen and canned fruits Fruits processed with salt or sodium-containing ingredient (some dried fruits are processed with sodium sulfites        Vegetables Fresh, frozen vegetables and low- sodium canned vegetables Regular canned vegetables, sauerkraut, pickled vegetables, and others prepared in brine; frozen vegetables in sauces; vegetables seasoned with ham, bacon or salt pork  Condiments, Sauces, Miscellaneous  Salt substitute with physician's approval; pepper, herbs, spices; vinegar, lemon or lime juice; hot pepper sauce; garlic powder, onion powder, low sodium soy sauce (1 Tbs.); low sodium  condiments (ketchup, chili sauce, mustard) in limited amounts (1 tsp.) fresh ground horseradish; unsalted tortilla chips, pretzels, potato chips, popcorn, salsa (1/4 cup) Any seasoning made with salt including garlic salt, celery salt, onion salt, and seasoned salt; sea salt, rock salt, kosher salt; meat tenderizers; monosodium glutamate; mustard, regular soy sauce, barbecue, sauce, chili sauce, teriyaki sauce, steak sauce, Worcestershire sauce, and most flavored vinegars; canned gravy and mixes; regular condiments; salted snack foods, olives, picles, relish, horseradish sauce, catsup   Food preparation: Try these seasonings Meats:    Pork Sage, onion Serve with applesauce  Chicken Poultry seasoning, thyme, parsley Serve with cranberry sauce  Lamb Curry powder, rosemary, garlic, thyme Serve with mint sauce or jelly  Veal Marjoram, basil Serve with current jelly, cranberry sauce  Beef Pepper, bay leaf Serve with dry mustard, unsalted chive butter  Fish [place] leaf, dill Serve with unsalted lemon butter, unsalted parsley butter  Vegetables:    Asparagus Lemon juice   Broccoli Lemon juice   Carrots Mustard dressing parsley, mint, nutmeg, glazed with unsalted butter and sugar   Green beans Marjoram, lemon juice, nutmeg,dill seed   Tomatoes Basil, marjoram, onion   Spice /blend for [employer]" 4 tsp ground thyme 1 tsp ground sage 3 tsp ground rosemary 4 tsp ground marjoram   Test your knowledge A product that says "Salt Free" may still contain sodium. True or False Garlic Powder and Hot Pepper Sauce an be used as alternative seasonings.True or False Processed foods have more sodium than fresh foods.  True or False Canned Vegetables have less sodium than froze True or False   WAYS TO DECREASE YOUR SODIUM INTAKE Avoid the use of added salt in cooking and at the table.  Table salt (and other prepared seasonings which contain salt) is probably one of the greatest sources of sodium in the diet.   Unsalted foods can gain flavor from the sweet, sour, and butter taste sensations of herbs and spices.  Instead of using salt for seasoning, try the following seasonings with the foods listed.  Remember: how you use them to enhance natural food flavors is limited only by your creativity... Allspice-Meat, fish, eggs, fruit, peas, red and yellow vegetables Almond Extract-Fruit baked goods Anise Seed-Sweet breads, fruit, carrots, beets, cottage cheese, cookies (tastes like licorice) Basil-Meat, fish, eggs, vegetables, rice, vegetables salads, soups, sauces Bay Leaf-Meat, fish, stews, poultry Burnet-Salad, vegetables (cucumber-like flavor) Caraway Seed-Bread, cookies, cottage cheese, meat, vegetables, cheese, rice Cardamon-Baked goods, fruit, soups Celery Powder or seed-Salads, salad dressings, sauces, meatloaf, soup, bread.Do not use  celery salt Chervil-Meats, salads, fish, eggs, vegetables, cottage cheese (parsley-like flavor) Chili Power-Meatloaf, chicken cheese, corn, eggplant, egg dishes Chives-Salads cottage cheese, egg dishes, soups, vegetables, sauces Cilantro-Salsa, casseroles Cinnamon-Baked goods, fruit, pork, lamb, chicken, carrots Cloves-Fruit, baked goods, fish, pot roast, green beans, beets, carrots Coriander-Pastry, cookies, meat, salads, cheese (lemon-orange flavor) Cumin-Meatloaf, fish,cheese, eggs, cabbage,fruit pie (caraway flavor) [employer], fruit, eggs, fish, poultry, cottage cheese, vegetables Dill Seed-Meat, cottage cheese, poultry, vegetables, fish, salads, bread Fennel Seed-Bread, cookies, apples, pork, eggs, fish, beets, cabbage, cheese, Licorice-like flavor Garlic-(buds or powder) Salads, meat, poultry, fish, bread, butter, vegetables, potatoes.Do not  use garlic salt Ginger-Fruit, vegetables, baked goods, meat, fish, poultry Horseradish Root-Meet, vegetables, butter Lemon Juice or Extract-Vegetables, fruit, tea, baked goods, fish salads Mace-Baked goods fruit,  vegetables, fish, poultry (  taste like nutmeg) Maple Extract-Syrups Marjoram-Meat, chicken, fish, vegetables, breads, green salads (taste like Sage) Mint-Tea, lamb, sherbet, vegetables, desserts, carrots, cabbage Mustard, Dry or Seed-Cheese, eggs, meats, vegetables, poultry Nutmeg-Baked goods, fruit, chicken, eggs, vegetables, desserts Onion Powder-Meat, fish, poultry, vegetables, cheese, eggs, bread, rice salads (Do not use   Onion salt) Orange Extract-Desserts, baked goods Oregano-Pasta, eggs, cheese, onions, pork, lamb, fish, chicken, vegetables, green salads Paprika-Meat, fish, poultry, eggs, cheese, vegetables Parsley Flakes-Butter, vegetables, meat fish, poultry, eggs, bread, salads (certain forms may   Contain sodium Pepper-Meat fish, poultry, vegetables, eggs Peppermint Extract-Desserts, baked goods Poppy Seed-Eggs, bread, cheese, fruit dressings, baked goods, noodles, vegetables, cottage  [employer], poultry, meat, fish, cauliflower, turnips,eggs bread Saffron-Rice, bread, veal, chicken, fish, eggs Sage-Meat, fish, poultry, onions, eggplant, tomateos, pork, stews Savory-Eggs, salads, poultry, meat, rice, vegetables, soups, pork Tarragon-Meat, poultry, fish, eggs, butter, vegetables (licorice-like flavor)  Thyme-Meat, poultry, fish, eggs, vegetables, (clover-like flavor), sauces, soups Tumeric-Salads, butter, eggs, fish, rice, vegetables (saffron-like flavor) Vanilla Extract-Baked goods, candy Vinegar-Salads, vegetables, meat marinades Walnut Extract-baked goods, candy   2. Choose your Foods Wisely   The following is a list of foods to avoid which are high in sodium:  Meats-Avoid all smoked, canned, salt cured, dried and kosher meat and fish as well as Anchovies   [employer] meats:Bologna, Liverwurst, Pastrami Canned meat or fish  Marinated herring Caviar    Pepperoni Corned Beef   Pizza Dried chipped  beef  Salami Frozen breaded fish or meat Salt pork Frankfurters or hot dogs  Sardines Gefilte fish   Sausage Ham (boiled ham, Proscuitto Smoked butt    spiced ham)   Spam      TV Dinners Vegetables Canned vegetables (Regular) Relish Canned mushrooms  Sauerkraut Olives    Tomato juice Pickles  Bakery and Dessert Products Canned puddings  Cream pies Cheesecake   Decorated cakes Cookies  Beverages/Juices Tomato juice, regular  Gatorade   V-8 vegetable juice, regular  Breads and Cereals Biscuit mixes   Salted potato chips, corn chips, pretzels Bread stuffing mixes  Salted crackers and rolls Pancake and waffle mixes Self-rising flour  Seasonings Accent    Meat sauces Barbecue sauce  Meat tenderizer Catsup    Monosodium glutamate (MSG) Celery salt   Onion salt Chili sauce   Prepared mustard Garlic salt   Salt, seasoned salt, sea salt Gravy mixes   Soy sauce Horseradish   Steak sauce Ketchup   Tartar sauce Lite salt    Teriyaki sauce Marinade mixes   Worcestershire sauce  Others Baking powder   Cocoa and cocoa mixes Baking soda   Commercial casserole mixes Candy-caramels, chocolate  Dehydrated soups    Bars, fudge,nougats  Instant rice and pasta mixes Canned broth or soup  Maraschino cherries Cheese, aged and processed cheese and cheese spreads  Learning Assessment Quiz  Indicated T (for True) or F (for False) for each of the following statements:  _____ Fresh fruits and vegetables and unprocessed grains are generally low in sodium _____ Water may contain a considerable amount of sodium, depending on the source _____ You can always tell if a food is high in sodium by tasting it _____ Certain laxatives my be high in sodium and should be avoided unless prescribed   by a physician or pharmacist _____ Salt substitutes may be used freely by anyone on a sodium restricted diet _____ Sodium is present in table salt, food additives and as a natural component of   most  foods _____ Table salt is approximately 90% sodium _____ Limiting sodium intake may help prevent excess fluid accumulation in the body _____ On a sodium-restricted diet, seasonings such as bouillon soy sauce, and    cooking wine should be used in place of table salt _____ On an ingredient list, a product which lists monosodium glutamate as the first   ingredient is an appropriate food to include on a low sodium diet  Circle the best answer(s) to the following statements (Hint: there may be more than one correct answer)  11. On a low-sodium diet, some acceptable snack items are:    A. Olives  F. Bean dip   K. Grapefruit juice    B. Salted Pretzels G. Commercial Popcorn   L. Canned peaches    C. Carrot Sticks  H. Bouillon   M. Unsalted nuts   D. [place] fries  I. Peanut butter crackers N. Salami   E. Sweet pickles J. Tomato Juice   O. Pizza  12.  Seasonings that may be used freely on a reduced - sodium diet include   A. Lemon wedges F.Monosodium glutamate K. Celery seed    B.Soysauce   G. Pepper   L. Mustard powder   C. Sea salt  H. Cooking wine  M. Onion flakes   D. Vinegar  E. Prepared horseradish N. Salsa   E. Sage   J. Worcestershire sauce  O. Chutney    Signed, Ermalinda Barrios, PA-C  09/22/2021 10:10 AM    [facility] [place], [place], [place]  [address] Phone: [phone]; Fax: [phone]

## 2021-09-16 NOTE — Progress Notes (Addendum)
[place], [place] [address]   CLINIC:  Medical Oncology/Hematology  PCP:  Celene Squibb, MD [address] 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 Formica 76 y.o. male returns for routine follow-up of his CLL, anemia, and prior DVT.  He was last seen by Tarri Abernethy PA-C on 05/03/2021.   At today's visit, he reports feeling fairly well.  No recent hospitalizations, surgeries, or changes in baseline health status.  He denies any new lumps or bumps.    He has not had any fever, chills, night sweats, or unintentional weight loss.   He has not had any extreme fatigue or frequent infections.    He denies symptoms of hyperviscosity such as headache, dizziness, tinnitus, or vision changes.   No early satiety or abdominal pain.   He denies any signs or symptoms of blood loss.   No chest pain, shortness of breath, syncope, or palpitations.     He continues to have chronic peripheral edema, left greater than right due to chronic LLE DVT, without any acute changes.   No symptoms of pulmonary embolism such as dyspnea, chest pain, or hemoptysis.      He has 75% energy and 100% appetite. He endorses that he is maintaining a stable weight.   REVIEW OF SYSTEMS:  Review of Systems  Constitutional:  Negative for appetite change, chills, diaphoresis, fatigue (energy 75% at baseline), fever and unexpected weight change.  HENT:   Negative for lump/mass and nosebleeds.   Eyes:  Negative for eye problems.  Respiratory:  Negative for cough, hemoptysis and shortness of breath.   Cardiovascular:  Positive for leg swelling. Negative for chest pain and palpitations.  Gastrointestinal:  Negative for abdominal pain, blood in stool, constipation, diarrhea, nausea and  vomiting.  Genitourinary:  Negative for hematuria.   Skin: Negative.   Neurological:  Negative for dizziness, headaches and light-headedness.  Hematological:  Does not bruise/bleed easily.     PAST MEDICAL/SURGICAL HISTORY:  Past Medical History:  Diagnosis Date   Acute MI, inferolateral wall, initial episode of care ([facility]) 06/16/2015   99% dCx    BPH (benign prostatic hyperplasia) 04/08/2011   CAD S/P percutaneous coronary angioplasty 11/23/12; 06/16/15   a. NSTEMI 2014 with 2 overlapping mRCA lesions - Xience Xpedition DES 2.75 mm x 18 mm (3.0 mm). b. Inf-Lat STEMI 06/16/2015 - dCx Asp Thrombectomy & PCI 3.0 x 15 Xience drDES (3.6 mm).   Chronic combined systolic and diastolic CHF (congestive heart failure) (HCC)    Chronic headache    CKD (chronic kidney disease), stage III (HCC)    CLL (chronic lymphocytic leukemia) ([facility]) 04/08/2011   Depression    Diverticulitis    DVT (deep venous thrombosis) ([facility]) 2021   Left, no anticoagulation due to history of diverticular bleeding, IVC filter placed.   Gallstone    GERD (gastroesophageal reflux disease)    Hypercholesteremia    Ischemic cardiomyopathy    Echo 06/18/2015 EF 35-40% after lateral MI -> repeat echo 08/06/2015: EF 40-45% with basal-mid inferolateral akinesis and hypokinesis of the lateral wall.   NSTEMI (non-ST elevated myocardial infarction) ([facility]) 11/23/2012   NSTEMI 11/23/2012 RCA lesion - PCI with DES; moderate LAD disease   PTSD (post-traumatic stress disorder)  With Depression - since MI   Past Surgical History:  Procedure Laterality Date   APPENDECTOMY  1968-70   CARDIAC CATHETERIZATION N/A 06/16/2015   Procedure: Left Heart Cath and Coronary Angiography;  Surgeon: Jettie Booze, MD;  Location: [facility];  Service: Cardiovascular: 99% thrombotic dCx -> asp thrombectomy & PCI. RCA Stents Patent.    CARDIAC CATHETERIZATION N/A 06/16/2015   Procedure: Coronary Stent Intervention;  Surgeon: Jettie Booze, MD;  Location: [facility];  Service: Cardiovascular: PCI dCx = thrombectomy -> 3.0 x 15 Xience drug-eluting stent (3.6 mm)    COLONOSCOPY  2008   [id number] diverticula, diminutive polyp in the cecum, s/p bx Normal rectum. Tubular adenoma   COLONOSCOPY N/A 10/17/2012   Dr. Gala Romney: colonic diverticulosis, tubular adenoma, surveillance due May 2019   COLONOSCOPY N/A 02/21/2018   Procedure: COLONOSCOPY;  Surgeon: Daneil Dolin, MD; Diverticulosis of the sigmoid and descending colon, otherwise normal exam.  No recommendations to repeat.   CORONARY STENT PLACEMENT  11/23/2012   Mid RCA -- Xience eXp - 2.75 mm x 18 mm DES (3.65m) , Dr. HEllyn Hack  ESOPHAGOGASTRODUODENOSCOPY N/A 03/03/2021   Procedure: ESOPHAGOGASTRODUODENOSCOPY (EGD);  Surgeon: RDaneil Dolin MD;  [facility];  Service: Endoscopy;  Laterality: N/A;  12:00pm   IR IVC FILTER PLMT / S&I /IMG GUID/MOD SED  05/27/2019   IR RADIOLOGIST EVAL & MGMT  09/11/2019   LEFT HEART CATHETERIZATION WITH CORONARY ANGIOGRAM N/A 11/23/2012   Procedure: LEFT HEART CATHETERIZATION WITH CORONARY ANGIOGRAM;  Surgeon: DLeonie Man MD;  [facility];  Service: Cardiovascular: NSTEMI: Culprit = mid RCA 95% & 75%; LAD ~40-50%, Small RI ~60%; EF ~60%, mild inf-basal HK      MALONEY DILATION N/A 03/03/2021   Procedure: MALONEY DILATION;  Surgeon: RDaneil Dolin MD;  [facility];  Service: Endoscopy;  Laterality: N/A;   TRANSTHORACIC ECHOCARDIOGRAM  January 2017; March 2017   a. EF 35-40%. Entire inferior hypokinesis and akinesis of the basal and mid inferolateral and anterolateral /distal lateral walls;; b. EF 40 and 45%. Akinesis of basal-mid inferolateral wall. Hypokinesis of entire lateral wall.     SOCIAL HISTORY:  Social History   Socioeconomic History   Marital status: Married    Spouse name: Not on file   Number of children: Not on file   Years of education: Not on file   Highest education  level: Not on file  Occupational History   Not on file  Tobacco Use   Smoking status: Former    Packs/day: 0.25    Years: 2.00    Pack years: 0.50    Types: Cigarettes    Quit date: 06/07/1963    Years since quitting: 58.3   Smokeless tobacco: Never  Vaping Use   Vaping Use: Never used  Substance and Sexual Activity   Alcohol use: No    Alcohol/week: 0.0 standard drinks    Comment: Occasionally drinks red wine   Drug use: No   Sexual activity: Not on file  Other Topics Concern   Not on file  Social History Narrative   Exercises 3 to 4 days a week at the fitness center for about a half an hour at a time.   "former smoker". Does not drink EtOH   Married.   Social Determinants of Health   Financial Resource Strain: Not on file  Food Insecurity: Not on file  Transportation Needs: Not on file  Physical Activity:  Not on file  Stress: Not on file  Social Connections: Not on file  Intimate Partner Violence: Not on file    FAMILY HISTORY:  Family History  Problem Relation Age of Onset   Diabetes Mother    Cancer Father        prostate   Colon cancer Neg Hx    Esophageal cancer Neg Hx    Stomach cancer Neg Hx     CURRENT MEDICATIONS:  Outpatient Encounter Medications as of 09/17/2021  Medication Sig Note   atorvastatin (LIPITOR) 40 MG tablet Take 1 tablet (40 mg total) by mouth daily.    clopidogrel (PLAVIX) 75 MG tablet TAKE 1 TABLET BY MOUTH EVERY DAY    [employer] VITAMIN B12 1000 MCG TBCR TAKE 1 TABLET BY MOUTH EVERY DAY    furosemide (LASIX) 20 MG tablet TAKE 1 TABLET BY MOUTH EVERY DAY    lisinopril (ZESTRIL) 2.5 MG tablet TAKE 1 TABLET BY MOUTH EVERY DAY    nitroGLYCERIN (NITROSTAT) 0.4 MG SL tablet Place 0.4 mg under the tongue every 5 (five) minutes as needed for chest pain. (Patient not taking: Reported on 05/03/2021) 02/25/2021: Newly prescribed, hasnt picked up yet   pantoprazole (PROTONIX) 40 MG tablet TAKE 1 TABLET BY MOUTH EVERY DAY    No facility-administered  encounter medications on file as of 09/17/2021.    ALLERGIES:  No Known Allergies   PHYSICAL EXAM:  ECOG PERFORMANCE STATUS: 1 - Symptomatic but completely ambulatory  There were no vitals filed for this visit. There were no vitals filed for this visit. Physical Exam Constitutional:      Appearance: Normal appearance. He is obese.  HENT:     Head: Normocephalic and atraumatic.     Mouth/Throat:     Mouth: Mucous membranes are moist.  Eyes:     Extraocular Movements: Extraocular movements intact.     Pupils: Pupils are equal, round, and reactive to light.  Cardiovascular:     Rate and Rhythm: Normal rate. Rhythm regularly irregular.     Pulses: Normal pulses.     Heart sounds: Normal heart sounds.  Pulmonary:     Effort: Pulmonary effort is normal.     Breath sounds: Rales (bibasilar crackles) present.  Abdominal:     General: Bowel sounds are normal.     Palpations: Abdomen is soft. There is no hepatomegaly or splenomegaly.     Tenderness: There is no abdominal tenderness.  Musculoskeletal:        General: No swelling.     Right lower leg: Edema (trace) present.     Left lower leg: Edema (1+) present.  Lymphadenopathy:     Head:     Right side of head: No submental, submandibular, tonsillar, preauricular, posterior auricular or occipital adenopathy.     Left side of head: No submental, submandibular, tonsillar, preauricular or posterior auricular adenopathy.     Cervical: No cervical adenopathy.     Right cervical: No superficial, deep or posterior cervical adenopathy.    Left cervical: No superficial, deep or posterior cervical adenopathy.     Upper Body:     Right upper body: No supraclavicular, axillary, pectoral or epitrochlear adenopathy.     Left upper body: No supraclavicular, axillary, pectoral or epitrochlear adenopathy.     Lower Body: No right inguinal adenopathy. No left inguinal adenopathy.  Skin:    General: Skin is warm and dry.  Neurological:      General: No focal deficit present.     Mental  Status: He is alert and oriented to person, place, and time.  Psychiatric:        Mood and Affect: Mood normal.        Behavior: Behavior normal.     LABORATORY DATA:  I have reviewed the labs as listed.  CBC    Component Value Date/Time   WBC 15.8 (H) 09/06/2021 1053   RBC 5.42 09/06/2021 1053   HGB 15.4 09/06/2021 1053   HCT 50.0 09/06/2021 1053   PLT 163 09/06/2021 1053   MCV 92.3 09/06/2021 1053   MCH 28.4 09/06/2021 1053   MCHC 30.8 09/06/2021 1053   RDW 14.3 09/06/2021 1053   LYMPHSABS 9.7 (H) 09/06/2021 1053   MONOABS 0.5 09/06/2021 1053   EOSABS 0.2 09/06/2021 1053   BASOSABS 0.1 09/06/2021 1053      Latest Ref Rng & Units 09/06/2021   10:53 AM 04/26/2021   10:59 AM 12/16/2020    1:20 PM  CMP  Glucose 70 - 99 mg/dL 117   133   149    BUN 8 - 23 mg/dL '17   20   23    '$ Creatinine 0.61 - 1.24 mg/dL 1.91   1.54   1.53    Sodium 135 - 145 mmol/L 139   140   138    Potassium 3.5 - 5.1 mmol/L 4.5   3.7   3.9    Chloride 98 - 111 mmol/L 107   107   106    CO2 22 - 32 mmol/L '25   27   24    '$ Calcium 8.9 - 10.3 mg/dL 9.1   9.1   9.2    Total Protein 6.5 - 8.1 g/dL 6.6   6.8   6.5    Total Bilirubin 0.3 - 1.2 mg/dL 1.3   1.0   1.6    Alkaline Phos 38 - 126 U/L 57   64   53    AST 15 - 41 U/L '17   18   22    '$ ALT 0 - 44 U/L '11   12   15      '$ DIAGNOSTIC IMAGING:  I have independently reviewed the relevant imaging and discussed with the patient.  ASSESSMENT & PLAN: 1.  Chronic lymphocytic anemia (Rai stage 0, Binet stage A) - Originally diagnosed in 2008, has been on watchful waiting since then with expected waxing and waning of his CLL - No fevers, night sweats, weight loss, or extreme fatigue.  No recurrent infections.   - No splenomegaly noted on CT abdomen/pelvis on 09/07/2017 - No lymphadenopathy or hepatosplenomegaly appreciated on exam  - Most recent labs (09/06/2021): WBC 15.8, lymphocytes 9.7 (mildly increased from  previous).  Hgb 15.4 with platelets 163.  LDH mildly elevated 220. - PLAN: Repeat labs and RTC in 4 months.  No indication for treatment at this time.   2.  History of provoked DVT  - DVT of left lower extremity during hospitalization for COVID-19 infection in December 2020 - Anticoagulation was not started due to diverticular GI bleed at that time - IVC filter placed on 05/27/2019 - per note by interventional radiologist (Dr. Jacqulynn Cadet) on 09/11/2019, IVC filter is to be considered a permanent device - Most recent venous duplex ultrasound (09/11/2019): Chronic thrombus of left common femoral, femoral, profunda femoral, and popliteal veins. - Not on anticoagulation at this time (although he does take Plavix for CAD with stents)  - PLAN: Stable at this time with IVC filter in  place  3.  Normocytic anemia, resolved - Intermittent anemia from iron deficiency and mild CKD - Received Venofer x5 doses from 07/23/2019 through 08/05/2019 - Most recent labs (09/06/2021): Hgb normal at 15.4 with normal ferritin 84 and iron saturation 27 % - PLAN: Stable at this time, repeat labs and RTC in 4 months   4.  CKD stage IIIb - Most recent creatinine (09/06/2021) 1.91/GFR 36 - PLAN: Continue follow-up with Dr. Theador Hawthorne    All questions were answered. The patient knows to call the clinic with any problems, questions or concerns.  Medical decision making: Moderate  Time spent on visit: I spent 20 minutes counseling the patient face to face. The total time spent in the appointment was 30 minutes and more than 50% was on counseling.   Harriett Rush, PA-C  09/17/2021 8:55 AM

## 2021-09-17 ENCOUNTER — Inpatient Hospital Stay (HOSPITAL_COMMUNITY): Payer: Medicare HMO | Admitting: Physician Assistant

## 2021-09-17 VITALS — BP 135/73 | HR 57 | Temp 97.6°F | Resp 16 | Ht 67.0 in | Wt 175.7 lb

## 2021-09-17 DIAGNOSIS — D649 Anemia, unspecified: Secondary | ICD-10-CM

## 2021-09-17 DIAGNOSIS — C9110 Chronic lymphocytic leukemia of B-cell type not having achieved remission: Secondary | ICD-10-CM | POA: Diagnosis not present

## 2021-09-17 NOTE — Patient Instructions (Signed)
[place] at [facility] ?Discharge Instructions ? ?You were seen today by Tarri Abernethy PA-C for your CLL (chronic lymphocytic leukemia).  This is a type of slow-growing leukemia (blood cancer).  Right now, you are not having any symptoms of your leukemia and your blood counts are stable.  You do not require any treatment at this time, but we will continue to monitor you periodically to check your blood counts, symptoms, and physical exam. ? ?Your anemia is also stable.  You do not have any concerning low blood counts at this time. ? ?LABS: Return in 4 months for repeat labs ? ?OTHER TESTS: None ? ?MEDICATIONS: No changes to home medication ? ?FOLLOW-UP APPOINTMENT: Office visit in 4 months ?- If you need to be seen sooner than 4 months, please call our office.  Be on the look out for "alarm symptoms" such as unexplained fever, chills, night sweats, unintentional weight loss.  If any of these occur, please call our office to schedule an appointment. ? ? ?Thank you for choosing [place] at [facility] to provide your oncology and hematology care.  To afford each patient quality time with our provider, please arrive at least 15 minutes before your scheduled appointment time.  ? ?If you have a lab appointment with the [facility] please come in thru the Main Entrance and check in at the main information desk. ? ?You need to re-schedule your appointment should you arrive 10 or more minutes late.  We strive to give you quality time with our providers, and arriving late affects you and other patients whose appointments are after yours.  Also, if you no show three or more times for appointments you may be dismissed from the clinic at the providers discretion.     ?Again, thank you for choosing [facility].  Our hope is that these requests will decrease the amount of time that you wait before being seen by our physicians.        ?_____________________________________________________________ ? ?Should you have questions after your visit to [facility], please contact our office at [phone] and follow the prompts.  Our office hours are 8:00 a.m. and 4:30 p.m. Monday - Friday.  Please note that voicemails left after 4:00 p.m. may not be returned until the following business day.  We are closed weekends and major holidays.  You do have access to a nurse 24-7, just call the main number to the clinic [phone] and do not press any options, hold on the line and a nurse will answer the phone.   ? ?For prescription refill requests, have your pharmacy contact our office and allow 72 hours.   ? ?Due to Covid, you will need to wear a mask upon entering the hospital. If you do not have a mask, a mask will be given to you at the Main Entrance upon arrival. For doctor visits, patients may have 1 support person age 37 or older with them. For treatment visits, patients can not have anyone with them due to social distancing guidelines and our immunocompromised population.  ? ? ? ?

## 2021-09-22 ENCOUNTER — Ambulatory Visit: Payer: Medicare HMO | Admitting: Physician Assistant

## 2021-09-22 ENCOUNTER — Encounter: Payer: Self-pay | Admitting: Physician Assistant

## 2021-09-22 VITALS — BP 104/58 | HR 60 | Wt 173.0 lb

## 2021-09-22 DIAGNOSIS — E785 Hyperlipidemia, unspecified: Secondary | ICD-10-CM

## 2021-09-22 DIAGNOSIS — I255 Ischemic cardiomyopathy: Secondary | ICD-10-CM | POA: Diagnosis not present

## 2021-09-22 DIAGNOSIS — R609 Edema, unspecified: Secondary | ICD-10-CM | POA: Diagnosis not present

## 2021-09-22 DIAGNOSIS — I10 Essential (primary) hypertension: Secondary | ICD-10-CM

## 2021-09-22 DIAGNOSIS — I2510 Atherosclerotic heart disease of native coronary artery without angina pectoris: Secondary | ICD-10-CM | POA: Diagnosis not present

## 2021-09-22 NOTE — Patient Instructions (Addendum)
Labwork: BMET- Complete with Echo  Testing/Procedures: Your physician has requested that you have an echocardiogram. Echocardiography is a painless test that uses sound waves to create images of your heart. It provides your doctor with information about the size and shape of your heart and how well your heart's chambers and valves are working. This procedure takes approximately one hour. There are no restrictions for this procedure.   Follow-Up: Follow up in 1 year with Dr. Ellyn Hack.   Any Other Special Instructions Will Be Listed Below (If Applicable).     If you need a refill on your cardiac medications before your next appointment, please call your pharmacy.   Two Gram Sodium Diet 2000 mg  What is Sodium? Sodium is a mineral found naturally in many foods. The most significant source of sodium in the diet is table salt, which is about 40% sodium.  Processed, convenience, and preserved foods also contain a large amount of sodium.  The body needs only 500 mg of sodium daily to function,  A normal diet provides more than enough sodium even if you do not use salt.  Why Limit Sodium? A build up of sodium in the body can cause thirst, increased blood pressure, shortness of breath, and water retention.  Decreasing sodium in the diet can reduce edema and risk of heart attack or stroke associated with high blood pressure.  Keep in mind that there are many other factors involved in these health problems.  Heredity, obesity, lack of exercise, cigarette smoking, stress and what you eat all play a role.  General Guidelines: Do not add salt at the table or in cooking.  One teaspoon of salt contains over 2 grams of sodium. Read food labels Avoid processed and convenience foods Ask your dietitian before eating any foods not dicussed in the menu planning guidelines Consult your physician if you wish to use a salt substitute or a sodium containing medication such as antacids.  Limit milk and milk  products to 16 oz (2 cups) per day.  Shopping Hints: READ LABELS!! "Dietetic" does not necessarily mean low sodium. Salt and other sodium ingredients are often added to foods during processing.    Menu Planning Guidelines Food Group Choose More Often Avoid  Beverages (see also the milk group All fruit juices, low-sodium, salt-free vegetables juices, low-sodium carbonated beverages Regular vegetable or tomato juices, commercially softened water used for drinking or cooking  Breads and Cereals Enriched white, wheat, rye and pumpernickel bread, hard rolls and dinner rolls; muffins, cornbread and waffles; most dry cereals, cooked cereal without added salt; unsalted crackers and breadsticks; low sodium or homemade bread crumbs Bread, rolls and crackers with salted tops; quick breads; instant hot cereals; pancakes; commercial bread stuffing; self-rising flower and biscuit mixes; regular bread crumbs or cracker crumbs  Desserts and Sweets Desserts and sweets mad with mild should be within allowance Instant pudding mixes and cake mixes  Fats Butter or margarine; vegetable oils; unsalted salad dressings, regular salad dressings limited to 1 Tbs; light, sour and heavy cream Regular salad dressings containing bacon fat, bacon bits, and salt pork; snack dips made with instant soup mixes or processed cheese; salted nuts  Fruits Most fresh, frozen and canned fruits Fruits processed with salt or sodium-containing ingredient (some dried fruits are processed with sodium sulfites        Vegetables Fresh, frozen vegetables and low- sodium canned vegetables Regular canned vegetables, sauerkraut, pickled vegetables, and others prepared in brine; frozen vegetables in sauces; vegetables seasoned with  ham, bacon or salt pork  Condiments, Sauces, Miscellaneous  Salt substitute with physician's approval; pepper, herbs, spices; vinegar, lemon or lime juice; hot pepper sauce; garlic powder, onion powder, low sodium soy  sauce (1 Tbs.); low sodium condiments (ketchup, chili sauce, mustard) in limited amounts (1 tsp.) fresh ground horseradish; unsalted tortilla chips, pretzels, potato chips, popcorn, salsa (1/4 cup) Any seasoning made with salt including garlic salt, celery salt, onion salt, and seasoned salt; sea salt, rock salt, kosher salt; meat tenderizers; monosodium glutamate; mustard, regular soy sauce, barbecue, sauce, chili sauce, teriyaki sauce, steak sauce, Worcestershire sauce, and most flavored vinegars; canned gravy and mixes; regular condiments; salted snack foods, olives, picles, relish, horseradish sauce, catsup   Food preparation: Try these seasonings Meats:    Pork Sage, onion Serve with applesauce  Chicken Poultry seasoning, thyme, parsley Serve with cranberry sauce  Lamb Curry powder, rosemary, garlic, thyme Serve with mint sauce or jelly  Veal Marjoram, basil Serve with current jelly, cranberry sauce  Beef Pepper, bay leaf Serve with dry mustard, unsalted chive butter  Fish [place] leaf, dill Serve with unsalted lemon butter, unsalted parsley butter  Vegetables:    Asparagus Lemon juice   Broccoli Lemon juice   Carrots Mustard dressing parsley, mint, nutmeg, glazed with unsalted butter and sugar   Green beans Marjoram, lemon juice, nutmeg,dill seed   Tomatoes Basil, marjoram, onion   Spice /blend for [employer]" 4 tsp ground thyme 1 tsp ground sage 3 tsp ground rosemary 4 tsp ground marjoram   Test your knowledge A product that says "Salt Free" may still contain sodium. True or False Garlic Powder and Hot Pepper Sauce an be used as alternative seasonings.True or False Processed foods have more sodium than fresh foods.  True or False Canned Vegetables have less sodium than froze True or False   WAYS TO DECREASE YOUR SODIUM INTAKE Avoid the use of added salt in cooking and at the table.  Table salt (and other prepared seasonings which contain salt) is probably one of the greatest sources  of sodium in the diet.  Unsalted foods can gain flavor from the sweet, sour, and butter taste sensations of herbs and spices.  Instead of using salt for seasoning, try the following seasonings with the foods listed.  Remember: how you use them to enhance natural food flavors is limited only by your creativity... Allspice-Meat, fish, eggs, fruit, peas, red and yellow vegetables Almond Extract-Fruit baked goods Anise Seed-Sweet breads, fruit, carrots, beets, cottage cheese, cookies (tastes like licorice) Basil-Meat, fish, eggs, vegetables, rice, vegetables salads, soups, sauces Bay Leaf-Meat, fish, stews, poultry Burnet-Salad, vegetables (cucumber-like flavor) Caraway Seed-Bread, cookies, cottage cheese, meat, vegetables, cheese, rice Cardamon-Baked goods, fruit, soups Celery Powder or seed-Salads, salad dressings, sauces, meatloaf, soup, bread.Do not use  celery salt Chervil-Meats, salads, fish, eggs, vegetables, cottage cheese (parsley-like flavor) Chili Power-Meatloaf, chicken cheese, corn, eggplant, egg dishes Chives-Salads cottage cheese, egg dishes, soups, vegetables, sauces Cilantro-Salsa, casseroles Cinnamon-Baked goods, fruit, pork, lamb, chicken, carrots Cloves-Fruit, baked goods, fish, pot roast, green beans, beets, carrots Coriander-Pastry, cookies, meat, salads, cheese (lemon-orange flavor) Cumin-Meatloaf, fish,cheese, eggs, cabbage,fruit pie (caraway flavor) [employer], fruit, eggs, fish, poultry, cottage cheese, vegetables Dill Seed-Meat, cottage cheese, poultry, vegetables, fish, salads, bread Fennel Seed-Bread, cookies, apples, pork, eggs, fish, beets, cabbage, cheese, Licorice-like flavor Garlic-(buds or powder) Salads, meat, poultry, fish, bread, butter, vegetables, potatoes.Do not  use garlic salt Ginger-Fruit, vegetables, baked goods, meat, fish, poultry Horseradish Root-Meet, vegetables, butter Lemon Juice or Extract-Vegetables, fruit, tea, baked  goods, fish  salads Mace-Baked goods fruit, vegetables, fish, poultry (taste like nutmeg) Maple Extract-Syrups Marjoram-Meat, chicken, fish, vegetables, breads, green salads (taste like Sage) Mint-Tea, lamb, sherbet, vegetables, desserts, carrots, cabbage Mustard, Dry or Seed-Cheese, eggs, meats, vegetables, poultry Nutmeg-Baked goods, fruit, chicken, eggs, vegetables, desserts Onion Powder-Meat, fish, poultry, vegetables, cheese, eggs, bread, rice salads (Do not use   Onion salt) Orange Extract-Desserts, baked goods Oregano-Pasta, eggs, cheese, onions, pork, lamb, fish, chicken, vegetables, green salads Paprika-Meat, fish, poultry, eggs, cheese, vegetables Parsley Flakes-Butter, vegetables, meat fish, poultry, eggs, bread, salads (certain forms may   Contain sodium Pepper-Meat fish, poultry, vegetables, eggs Peppermint Extract-Desserts, baked goods Poppy Seed-Eggs, bread, cheese, fruit dressings, baked goods, noodles, vegetables, cottage  [employer], poultry, meat, fish, cauliflower, turnips,eggs bread Saffron-Rice, bread, veal, chicken, fish, eggs Sage-Meat, fish, poultry, onions, eggplant, tomateos, pork, stews Savory-Eggs, salads, poultry, meat, rice, vegetables, soups, pork Tarragon-Meat, poultry, fish, eggs, butter, vegetables (licorice-like flavor)  Thyme-Meat, poultry, fish, eggs, vegetables, (clover-like flavor), sauces, soups Tumeric-Salads, butter, eggs, fish, rice, vegetables (saffron-like flavor) Vanilla Extract-Baked goods, candy Vinegar-Salads, vegetables, meat marinades Walnut Extract-baked goods, candy   2. Choose your Foods Wisely   The following is a list of foods to avoid which are high in sodium:  Meats-Avoid all smoked, canned, salt cured, dried and kosher meat and fish as well as Anchovies   [employer] meats:Bologna, Liverwurst, Pastrami Canned meat or fish  Marinated herring Caviar    Pepperoni Corned  Beef   Pizza Dried chipped beef  Salami Frozen breaded fish or meat Salt pork Frankfurters or hot dogs  Sardines Gefilte fish   Sausage Ham (boiled ham, Proscuitto Smoked butt    spiced ham)   Spam      TV Dinners Vegetables Canned vegetables (Regular) Relish Canned mushrooms  Sauerkraut Olives    Tomato juice Pickles  Bakery and Dessert Products Canned puddings  Cream pies Cheesecake   Decorated cakes Cookies  Beverages/Juices Tomato juice, regular  Gatorade   V-8 vegetable juice, regular  Breads and Cereals Biscuit mixes   Salted potato chips, corn chips, pretzels Bread stuffing mixes  Salted crackers and rolls Pancake and waffle mixes Self-rising flour  Seasonings Accent    Meat sauces Barbecue sauce  Meat tenderizer Catsup    Monosodium glutamate (MSG) Celery salt   Onion salt Chili sauce   Prepared mustard Garlic salt   Salt, seasoned salt, sea salt Gravy mixes   Soy sauce Horseradish   Steak sauce Ketchup   Tartar sauce Lite salt    Teriyaki sauce Marinade mixes   Worcestershire sauce  Others Baking powder   Cocoa and cocoa mixes Baking soda   Commercial casserole mixes Candy-caramels, chocolate  Dehydrated soups    Bars, fudge,nougats  Instant rice and pasta mixes Canned broth or soup  Maraschino cherries Cheese, aged and processed cheese and cheese spreads  Learning Assessment Quiz  Indicated T (for True) or F (for False) for each of the following statements:  _____ Fresh fruits and vegetables and unprocessed grains are generally low in sodium _____ Water may contain a considerable amount of sodium, depending on the source _____ You can always tell if a food is high in sodium by tasting it _____ Certain laxatives my be high in sodium and should be avoided unless prescribed   by a physician or pharmacist _____ Salt substitutes may be used freely by anyone on a sodium restricted diet _____ Sodium is present in table salt, food  additives and as a natural  component of   most foods _____ Table salt is approximately 90% sodium _____ Limiting sodium intake may help prevent excess fluid accumulation in the body _____ On a sodium-restricted diet, seasonings such as bouillon soy sauce, and    cooking wine should be used in place of table salt _____ On an ingredient list, a product which lists monosodium glutamate as the first   ingredient is an appropriate food to include on a low sodium diet  Circle the best answer(s) to the following statements (Hint: there may be more than one correct answer)  11. On a low-sodium diet, some acceptable snack items are:    A. Olives  F. Bean dip   K. Grapefruit juice    B. Salted Pretzels G. Commercial Popcorn   L. Canned peaches    C. Carrot Sticks  H. Bouillon   M. Unsalted nuts   D. [place] fries  I. Peanut butter crackers N. Salami   E. Sweet pickles J. Tomato Juice   O. Pizza  12.  Seasonings that may be used freely on a reduced - sodium diet include   A. Lemon wedges F.Monosodium glutamate K. Celery seed    B.Soysauce   G. Pepper   L. Mustard powder   C. Sea salt  H. Cooking wine  M. Onion flakes   D. Vinegar  E. Prepared horseradish N. Salsa   E. Sage   J. Worcestershire sauce  O. Chutney

## 2021-10-05 ENCOUNTER — Ambulatory Visit (HOSPITAL_COMMUNITY)
Admission: RE | Admit: 2021-10-05 | Discharge: 2021-10-05 | Disposition: A | Discharge status: Home / Self Care | Payer: Medicare HMO | Source: Ambulatory Visit | Attending: Physician Assistant | Admitting: Physician Assistant

## 2021-10-05 DIAGNOSIS — R600 Localized edema: Secondary | ICD-10-CM

## 2021-10-05 DIAGNOSIS — I255 Ischemic cardiomyopathy: Secondary | ICD-10-CM | POA: Diagnosis not present

## 2021-10-05 DIAGNOSIS — R609 Edema, unspecified: Secondary | ICD-10-CM | POA: Insufficient documentation

## 2021-10-05 LAB — ECHOCARDIOGRAM COMPLETE
AR max vel: 1.89 cm2
AV Area VTI: 2.31 cm2
AV Area mean vel: 1.87 cm2
AV Mean grad: 2.4 mmHg
AV Peak grad: 5.1 mmHg
Ao pk vel: 1.13 m/s
Area-P 1/2: 4.26 cm2
MV M vel: 3.24 m/s
MV Peak grad: 42 mmHg
S' Lateral: 3 cm

## 2021-11-03 DIAGNOSIS — D472 Monoclonal gammopathy: Secondary | ICD-10-CM | POA: Diagnosis not present

## 2021-11-03 DIAGNOSIS — I129 Hypertensive chronic kidney disease with stage 1 through stage 4 chronic kidney disease, or unspecified chronic kidney disease: Secondary | ICD-10-CM | POA: Diagnosis not present

## 2021-11-03 DIAGNOSIS — N1832 Chronic kidney disease, stage 3b: Secondary | ICD-10-CM | POA: Diagnosis not present

## 2021-11-03 DIAGNOSIS — C9190 Lymphoid leukemia, unspecified not having achieved remission: Secondary | ICD-10-CM | POA: Diagnosis not present

## 2021-11-03 DIAGNOSIS — I5042 Chronic combined systolic (congestive) and diastolic (congestive) heart failure: Secondary | ICD-10-CM | POA: Diagnosis not present

## 2021-11-17 DIAGNOSIS — I2510 Atherosclerotic heart disease of native coronary artery without angina pectoris: Secondary | ICD-10-CM | POA: Insufficient documentation

## 2021-12-02 IMAGING — DX DG CHEST 1V
1 series · 1 of 1 positions shown · non-contrast
Comparison: May 23, 2019.

CLINICAL DATA: Pneumonia

EXAM:
CHEST  1 VIEW

[chest]
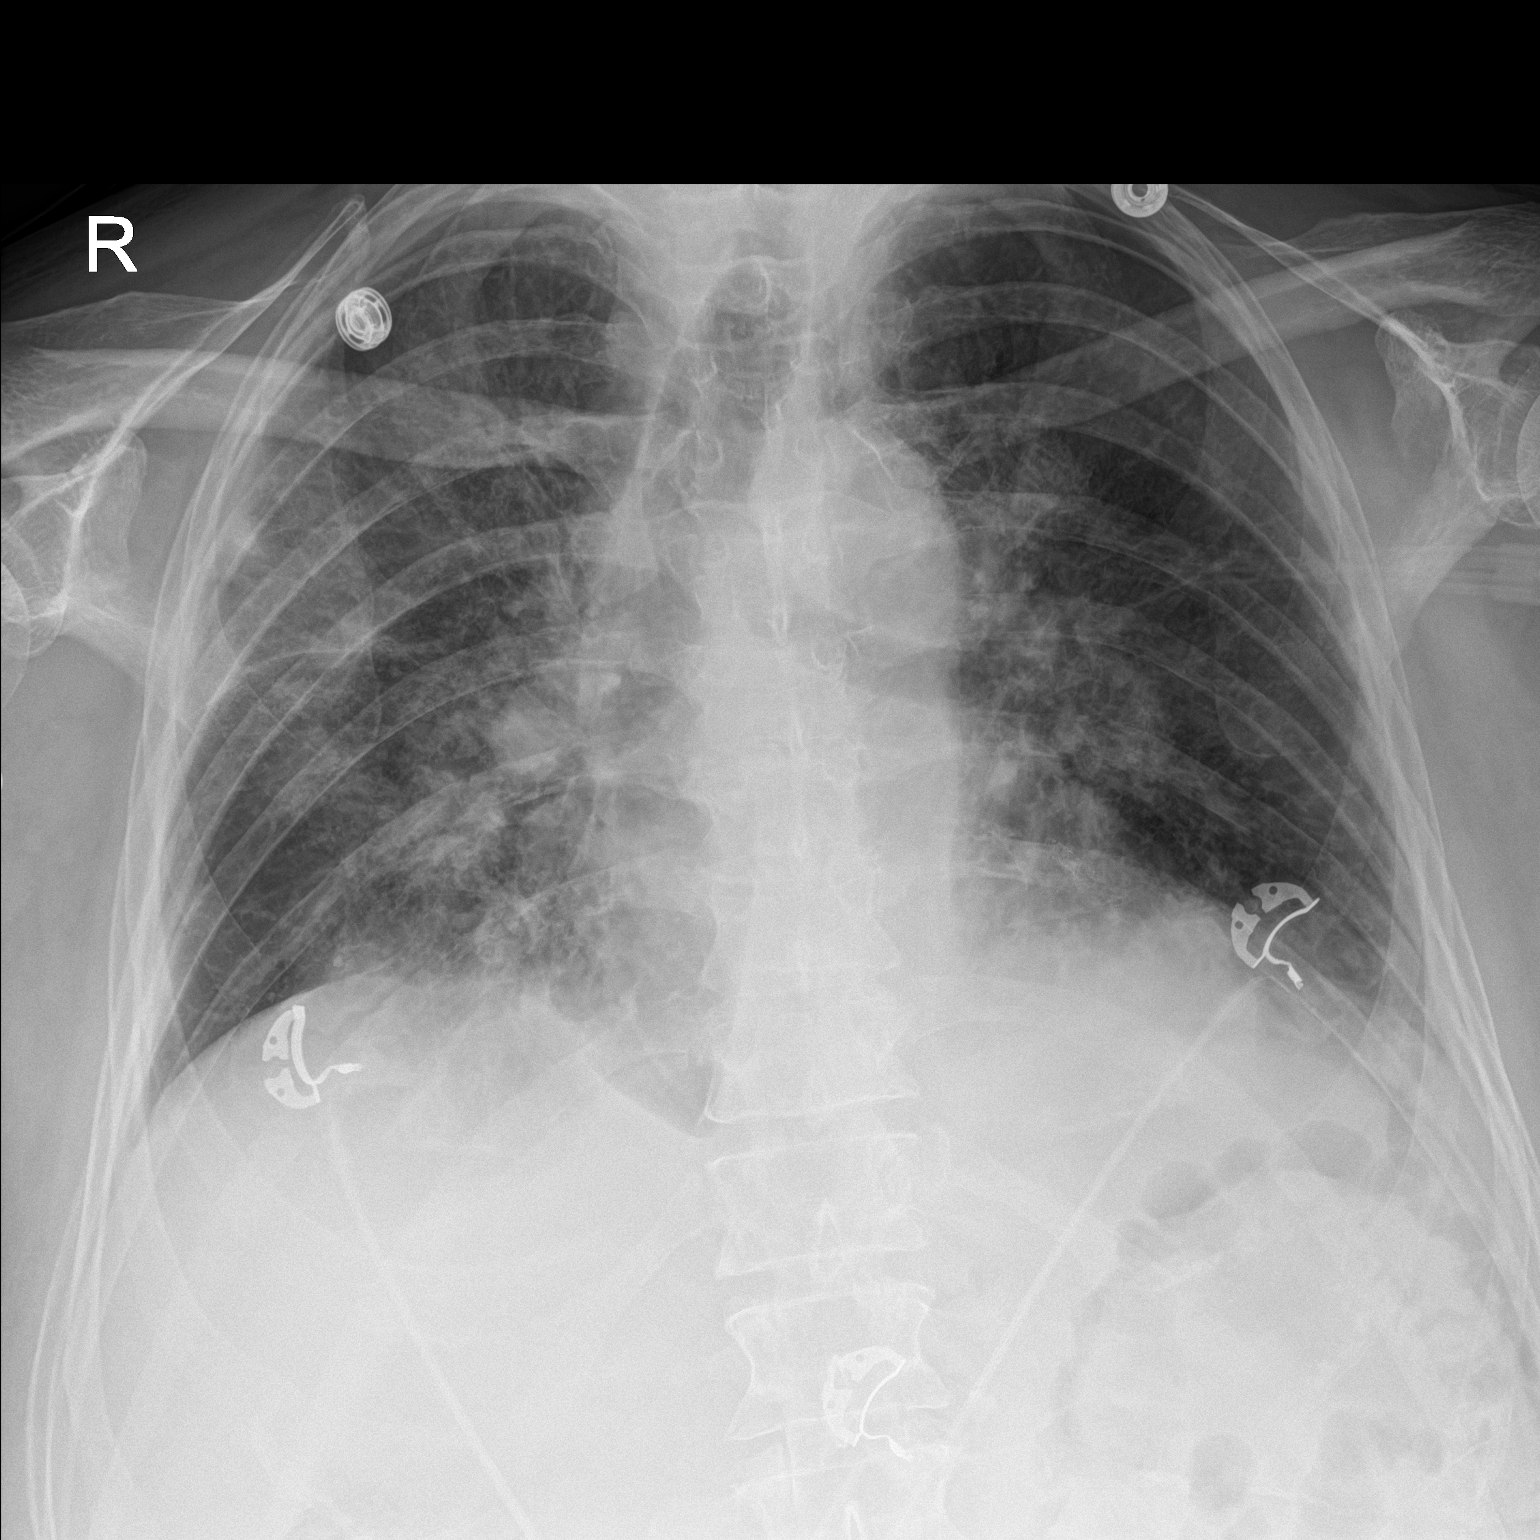

[1 of 1 positions shown; findings below may reference images not displayed]

FINDINGS: Ill-defined airspace opacity remains in the right upper lobe. There
is subtle airspace opacity in each lung base, slightly increased
bilaterally. There is a minimal left pleural effusion. Heart is
upper normal in size with pulmonary vascularity normal. No
adenopathy. Interval removal of central catheter without
pneumothorax. No bone lesions.
IMPRESSION: Areas of ill-defined airspace opacity in the right upper lobe,
stable, as well as in the lung bases, slightly increased. Stable
cardiac silhouette. No adenopathy appreciable. Minimal left pleural
effusion present.

## 2021-12-02 IMAGING — XA IR IVC FILTER PLMT / S&I /IMG GUID/MOD SED
2 series · 13 of 13 positions shown · IV contrast (IODINE)
Comparison: none

INDICATION: 73-year-old male currently admitted with COVID pneumonia. Patient
also experienced hematochezia from presumed lower GI bleed during
this hospital admission necessitating cessation of his
anticoagulation. Laboratory evaluation demonstrated and elevated
D-dimer and bilateral lower extremity venous duplex showed positive
bilateral age indeterminate DVT. He currently cannot be
anticoagulated secondary to continuing to pass heme-positive stools.
Therefore, he is a candidate for temporary caval interruption for PE
prophylaxis.

[Series 1: body 4 care · 8 of 8 slices shown (1 of 2)]
[im 1/8]
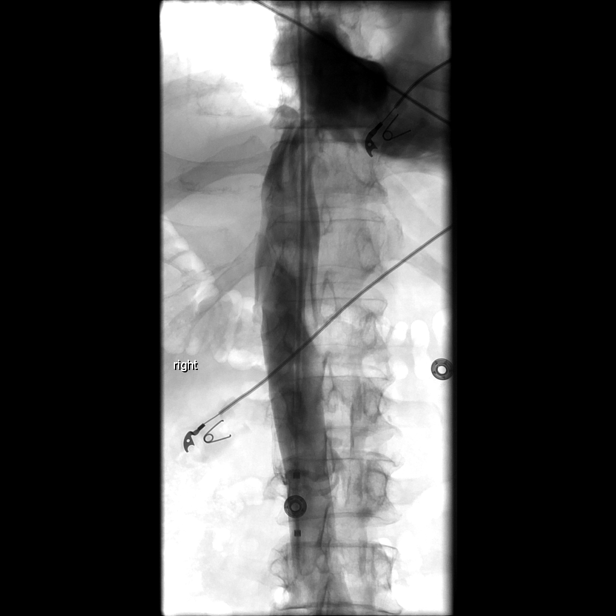
[im 2/8]
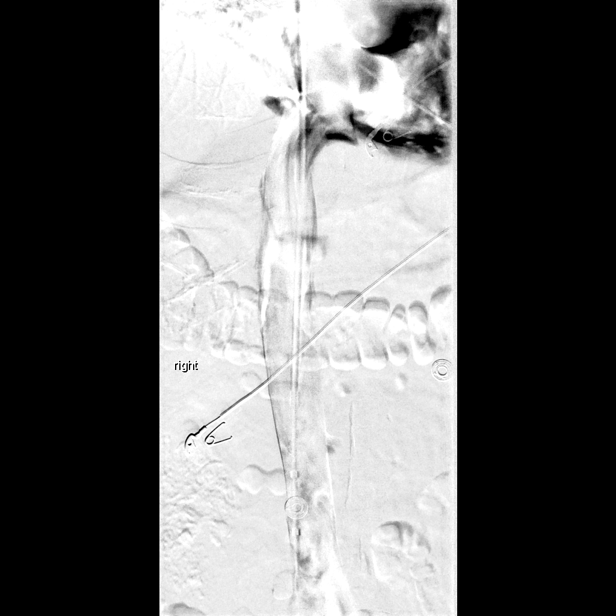
[im 3/8]
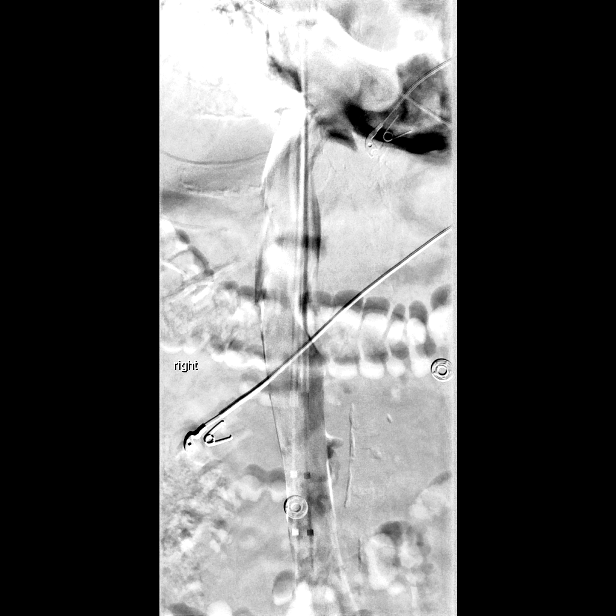
[im 4/8]
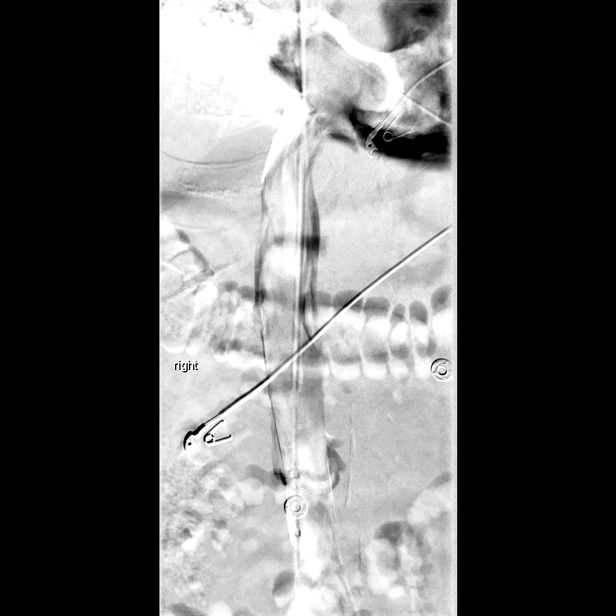
[im 5/8]
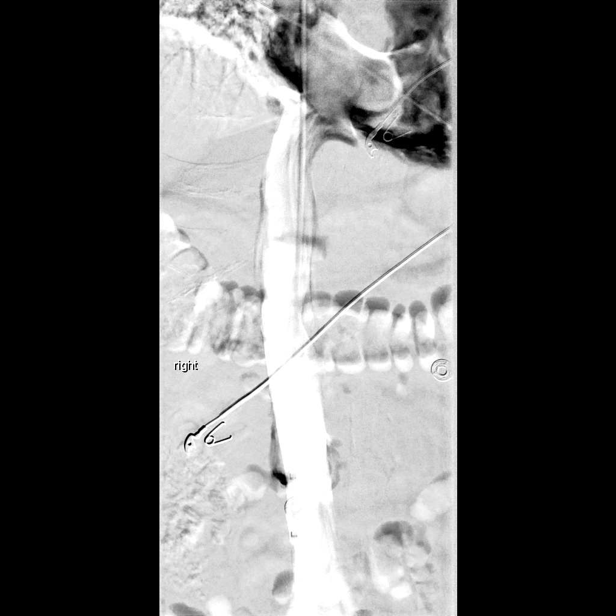
[im 6/8]
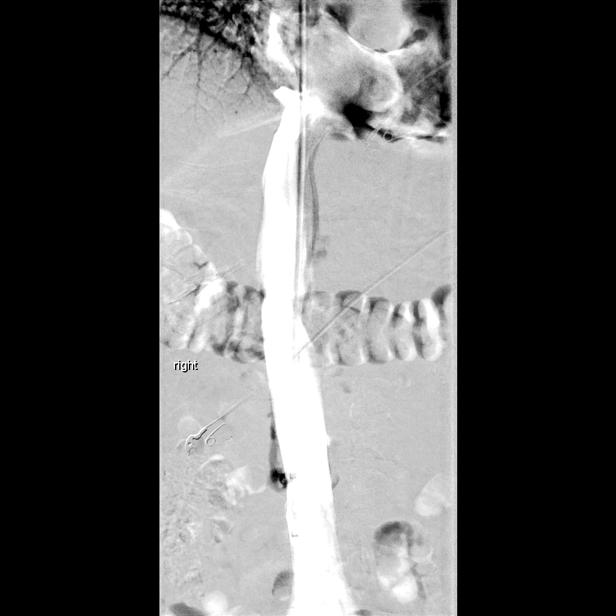
[im 7/8]
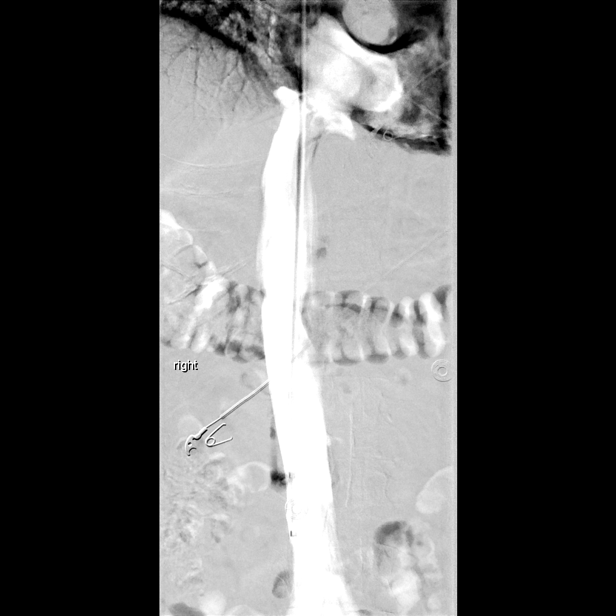
[im 8/8]
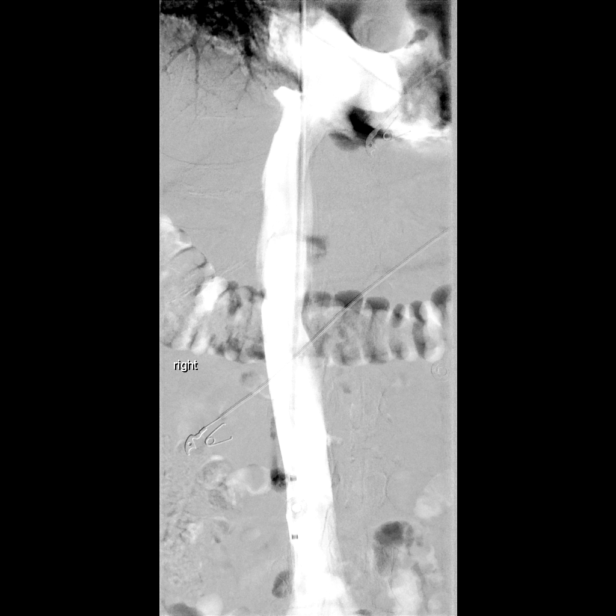

[Series 2: body 4 care · 5 of 5 slices shown (2 of 2)]
[im 1/5]
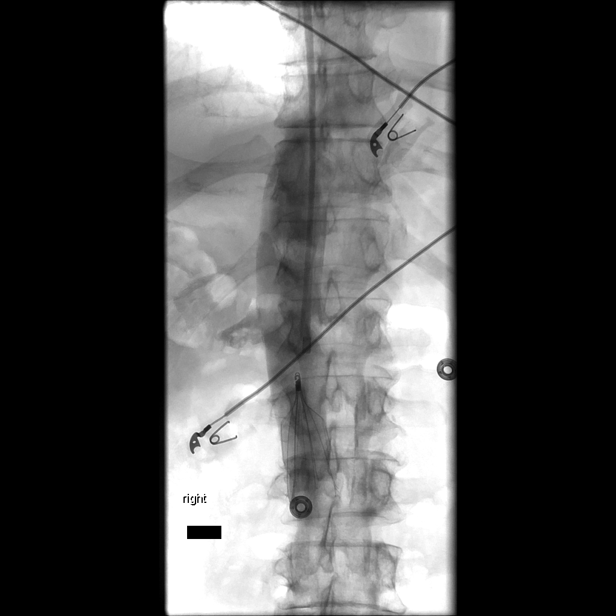
[im 2/5]
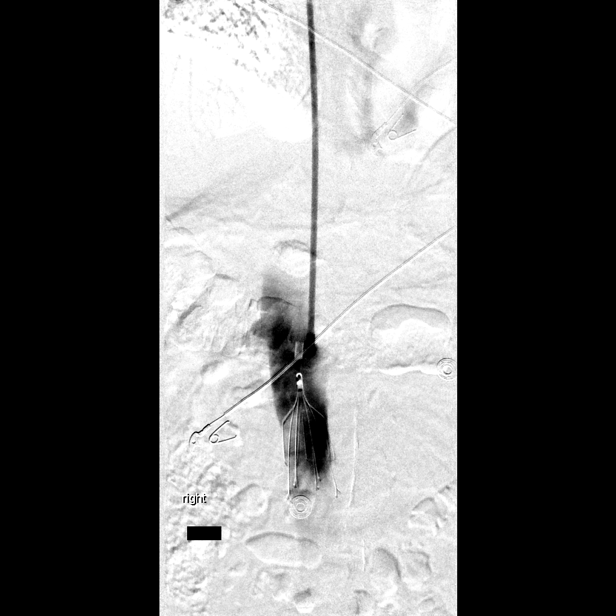
[im 3/5]
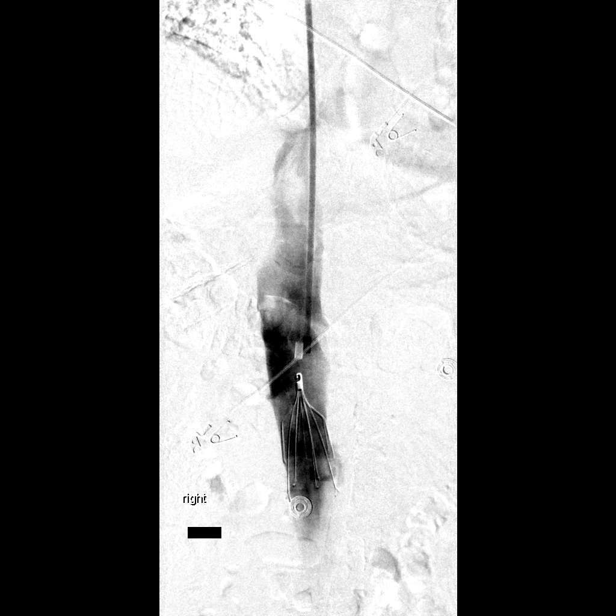
[im 4/5]
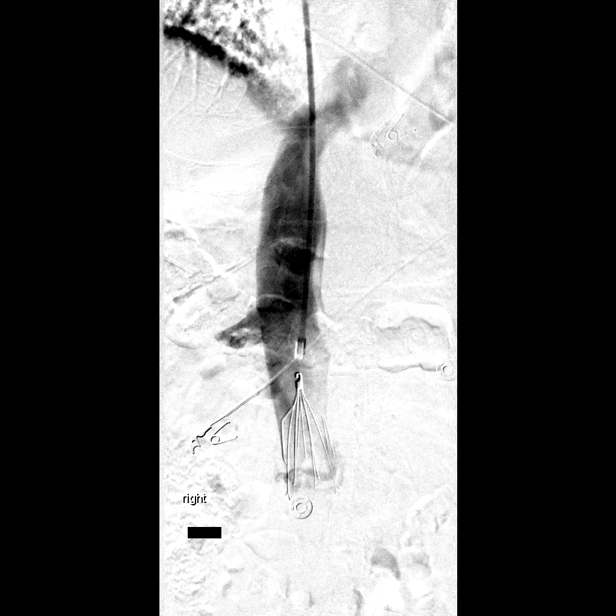
[im 5/5]
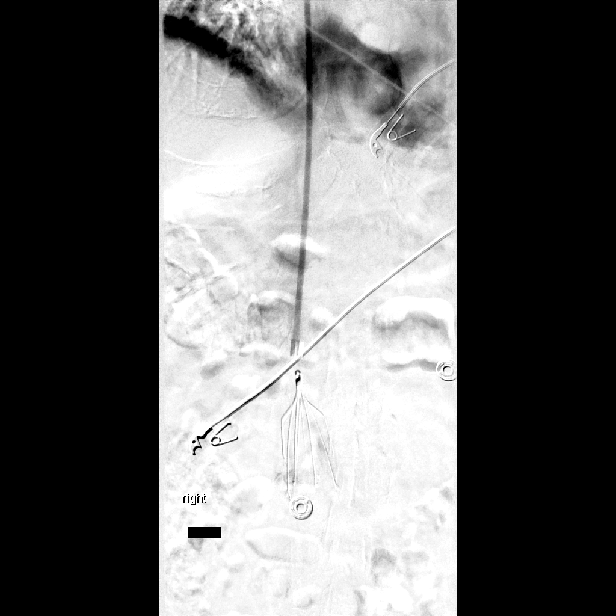

[13 of 13 positions shown; findings below may reference images not displayed]

EXAM:
ULTRASOUND GUIDANCE FOR VASCULARACCESS

IVC CATHETERIZATION AND VENOGRAM

IVC FILTER INSERTION

MEDICATIONS:
None.

ANESTHESIA/SEDATION:
No sedation.

50 mcg fentanyl administered for pain control.

FLUOROSCOPY TIME:  Fluoroscopy Time: 0 minutes 36 seconds (19 mGy).

COMPLICATIONS:
None immediate.

PROCEDURE:
Informed written consent was obtained from the patient after a
thorough discussion of the procedural risks, benefits and
alternatives. All questions were addressed. Maximal Sterile Barrier
Technique was utilized including caps, mask, sterile gowns, sterile
gloves, sterile drape, hand hygiene and skin antiseptic. A timeout
was performed prior to the initiation of the procedure.

Maximal barrier sterile technique utilized including caps, mask,
sterile gowns, sterile gloves, large sterile drape, hand hygiene,
and Betadine prep.

Under sterile condition and local anesthesia, right internal jugular
venous access was performed with ultrasound. An ultrasound image was
saved and sent to PACS. Over a guidewire, the IVC filter delivery
sheath and inner dilator were advanced into the IVC just above the
IVC bifurcation. Contrast injection was performed for an IVC
venogram.

Through the delivery sheath, a retrievable Denali IVC filter was
deployed below the level of the renal veins and above the IVC
bifurcation. Limited post deployment venacavagram was performed.

The delivery sheath was removed and hemostasis was obtained with
manual compression. A dressing was placed. The patient tolerated the
procedure well without immediate post procedural complication.
FINDINGS: The IVC is patent. No evidence of thrombus, stenosis, or occlusion.
No variant venous anatomy. Successful placement of the IVC filter
below the level of the renal veins.
IMPRESSION: Successful ultrasound and fluoroscopically guided placement of an
infrarenal retrievable IVC filter via right jugular approach.

PLAN:
This IVC filter is potentially retrievable. The patient will be
approximately 8-12 weeks. Further recommendations regarding filter
retrieval, continued surveillance or declaration of device
permanence, will be made at that time.

## 2021-12-15 ENCOUNTER — Encounter (INDEPENDENT_AMBULATORY_CARE_PROVIDER_SITE_OTHER): Payer: Medicare HMO | Admitting: Nurse Practitioner

## 2022-01-10 ENCOUNTER — Inpatient Hospital Stay: Payer: Medicare HMO | Attending: Hematology

## 2022-01-10 DIAGNOSIS — Z7902 Long term (current) use of antithrombotics/antiplatelets: Secondary | ICD-10-CM | POA: Diagnosis not present

## 2022-01-10 DIAGNOSIS — Z79899 Other long term (current) drug therapy: Secondary | ICD-10-CM | POA: Diagnosis not present

## 2022-01-10 DIAGNOSIS — Z86718 Personal history of other venous thrombosis and embolism: Secondary | ICD-10-CM | POA: Insufficient documentation

## 2022-01-10 DIAGNOSIS — N1832 Chronic kidney disease, stage 3b: Secondary | ICD-10-CM | POA: Diagnosis not present

## 2022-01-10 DIAGNOSIS — Z87891 Personal history of nicotine dependence: Secondary | ICD-10-CM | POA: Diagnosis not present

## 2022-01-10 DIAGNOSIS — D509 Iron deficiency anemia, unspecified: Secondary | ICD-10-CM | POA: Insufficient documentation

## 2022-01-10 DIAGNOSIS — C9110 Chronic lymphocytic leukemia of B-cell type not having achieved remission: Secondary | ICD-10-CM | POA: Diagnosis not present

## 2022-01-10 DIAGNOSIS — D649 Anemia, unspecified: Secondary | ICD-10-CM

## 2022-01-10 LAB — LACTATE DEHYDROGENASE: LDH: 132 U/L (ref 98–192)

## 2022-01-10 LAB — CBC WITH DIFFERENTIAL/PLATELET
Abs Immature Granulocytes: 0 10*3/uL (ref 0.00–0.07)
Basophils Absolute: 0 10*3/uL (ref 0.0–0.1)
Basophils Relative: 0 %
Eosinophils Absolute: 0.2 10*3/uL (ref 0.0–0.5)
Eosinophils Relative: 1 %
HCT: 46.8 % (ref 39.0–52.0)
Hemoglobin: 14.9 g/dL (ref 13.0–17.0)
Lymphocytes Relative: 64 %
Lymphs Abs: 11.1 10*3/uL — ABNORMAL HIGH (ref 0.7–4.0)
MCH: 29.4 pg (ref 26.0–34.0)
MCHC: 31.8 g/dL (ref 30.0–36.0)
MCV: 92.5 fL (ref 80.0–100.0)
Monocytes Absolute: 0.5 10*3/uL (ref 0.1–1.0)
Monocytes Relative: 3 %
Neutro Abs: 5.6 10*3/uL (ref 1.7–7.7)
Neutrophils Relative %: 32 %
Platelets: 196 10*3/uL (ref 150–400)
RBC: 5.06 MIL/uL (ref 4.22–5.81)
RDW: 14.8 % (ref 11.5–15.5)
WBC: 17.4 10*3/uL — ABNORMAL HIGH (ref 4.0–10.5)
nRBC: 0 % (ref 0.0–0.2)

## 2022-01-10 LAB — COMPREHENSIVE METABOLIC PANEL
ALT: 12 U/L (ref 0–44)
AST: 22 U/L (ref 15–41)
Albumin: 3.9 g/dL (ref 3.5–5.0)
Alkaline Phosphatase: 74 U/L (ref 38–126)
Anion gap: 6 (ref 5–15)
BUN: 11 mg/dL (ref 8–23)
CO2: 24 mmol/L (ref 22–32)
Calcium: 9 mg/dL (ref 8.9–10.3)
Chloride: 107 mmol/L (ref 98–111)
Creatinine, Ser: 1.8 mg/dL — ABNORMAL HIGH (ref 0.61–1.24)
GFR, Estimated: 38 mL/min — ABNORMAL LOW (ref >60)
Glucose, Bld: 117 mg/dL — ABNORMAL HIGH (ref 70–99)
Potassium: 4.1 mmol/L (ref 3.5–5.1)
Sodium: 137 mmol/L (ref 135–145)
Total Bilirubin: 1.3 mg/dL — ABNORMAL HIGH (ref 0.3–1.2)
Total Protein: 6.6 g/dL (ref 6.5–8.1)

## 2022-01-10 LAB — IRON AND TIBC
Iron: 78 ug/dL (ref 45–182)
Saturation Ratios: 26 % (ref 17.9–39.5)
TIBC: 296 ug/dL (ref 250–450)
UIBC: 218 ug/dL

## 2022-01-10 LAB — FERRITIN: Ferritin: 123 ng/mL (ref 24–336)

## 2022-01-13 DIAGNOSIS — D472 Monoclonal gammopathy: Secondary | ICD-10-CM | POA: Diagnosis not present

## 2022-01-13 DIAGNOSIS — N1832 Chronic kidney disease, stage 3b: Secondary | ICD-10-CM | POA: Diagnosis not present

## 2022-01-13 DIAGNOSIS — I5042 Chronic combined systolic (congestive) and diastolic (congestive) heart failure: Secondary | ICD-10-CM | POA: Diagnosis not present

## 2022-01-13 DIAGNOSIS — E211 Secondary hyperparathyroidism, not elsewhere classified: Secondary | ICD-10-CM | POA: Diagnosis not present

## 2022-01-13 DIAGNOSIS — I129 Hypertensive chronic kidney disease with stage 1 through stage 4 chronic kidney disease, or unspecified chronic kidney disease: Secondary | ICD-10-CM | POA: Diagnosis not present

## 2022-01-13 DIAGNOSIS — C9190 Lymphoid leukemia, unspecified not having achieved remission: Secondary | ICD-10-CM | POA: Diagnosis not present

## 2022-01-14 NOTE — Progress Notes (Unsigned)
[name], [place] [id number]   CLINIC:  Medical Oncology/Hematology  PCP:  [name] Squibb, [place] [address] 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 male returns for routine follow-up of his CLL, anemia, and prior DVT.  He was last seen by [name] Abernethy PA-C on 09/17/2021.   At today's visit, he reports feeling fairly well.  No recent hospitalizations, surgeries, or changes in baseline health status.  He denies any new lumps or bumps.  He has not had any fever, chills, night sweats, or unintentional weight loss.  He has not had any extreme fatigue or frequent infections.   He denies symptoms of hyperviscosity such as headache, dizziness, tinnitus, or vision changes.  No early satiety or abdominal pain.  He denies any signs or symptoms of blood loss.   No chest pain, shortness of breath, syncope, or palpitations.     He continues to have chronic peripheral edema, left greater than right due to chronic LLE DVT, without any acute changes.   No symptoms of pulmonary embolism such as dyspnea, chest pain, or hemoptysis.      He has 75% energy and 100% appetite. He endorses that he is maintaining a stable weight.   REVIEW OF SYSTEMS:    Review of Systems  Constitutional:  Negative for appetite change, chills, diaphoresis, fatigue (energy 75% at baseline), fever and unexpected weight change.  HENT:   Negative for lump/mass and nosebleeds.   Eyes:  Negative for eye problems.  Respiratory:  Positive for cough (increased phlegm production, "clearing throat" often). Negative for hemoptysis and shortness of breath.   Cardiovascular:  Positive for leg swelling (L > R, chronic). Negative for chest pain and palpitations.  Gastrointestinal:   Positive for abdominal pain, diarrhea and nausea (resolved). Negative for blood in stool, constipation and vomiting.  Genitourinary:  Negative for hematuria.   Skin: Negative.   Neurological:  Negative for dizziness, headaches and light-headedness.  Hematological:  Does not bruise/bleed easily.     PAST MEDICAL/SURGICAL HISTORY:  Past Medical History:  Diagnosis Date   Acute MI, inferolateral wall, initial episode of care ([facility]) 06/16/2015   99% dCx    BPH (benign prostatic hyperplasia) 04/08/2011   CAD S/P percutaneous coronary angioplasty 11/23/12; 06/16/15   a. NSTEMI 2014 with 2 overlapping mRCA lesions - Xience Xpedition DES 2.75 mm x 18 mm (3.0 mm). b. Inf-Lat STEMI 06/16/2015 - dCx Asp Thrombectomy & PCI 3.0 x 15 Xience drDES (3.6 mm).   Chronic combined systolic and diastolic CHF (congestive heart failure) (HCC)    Chronic headache    CKD (chronic kidney disease), stage III (HCC)    CLL (chronic lymphocytic leukemia) ([facility]) 04/08/2011   Depression    Diverticulitis    DVT (deep venous thrombosis) ([facility]) 2021   Left, no anticoagulation due to history of diverticular bleeding, IVC filter placed.   Gallstone    GERD (gastroesophageal reflux disease)    Hypercholesteremia    Ischemic cardiomyopathy    Echo 06/18/2015 EF 35-40% after lateral MI -> repeat echo 08/06/2015: EF 40-45% with basal-mid inferolateral akinesis and hypokinesis of the lateral wall.   NSTEMI (non-ST elevated myocardial infarction) ([facility]) 11/23/2012   NSTEMI 11/23/2012 RCA lesion - PCI with  DES; moderate LAD disease   PTSD (post-traumatic stress disorder)    With Depression - since MI   Past Surgical History:  Procedure Laterality Date   APPENDECTOMY  1968-70   CARDIAC CATHETERIZATION N/A 06/16/2015   Procedure: Left Heart Cath and Coronary Angiography;  Surgeon: [name];  Location: [facility];  Service: Cardiovascular: 99% thrombotic dCx -> asp thrombectomy & PCI. RCA Stents Patent.     CARDIAC CATHETERIZATION N/A 06/16/2015   Procedure: Coronary Stent Intervention;  Surgeon: [name];  Location: [facility];  Service: Cardiovascular: PCI dCx = thrombectomy -> 3.0 x 15 Xience drug-eluting stent (3.6 mm)    COLONOSCOPY  2008   UXN:ATFT-[id number] diverticula, diminutive polyp in the cecum, s/p bx Normal rectum. Tubular adenoma   COLONOSCOPY N/A 10/17/2012   [name]: colonic diverticulosis, tubular adenoma, surveillance due May 2019   COLONOSCOPY N/A 02/21/2018   Procedure: COLONOSCOPY;  Surgeon: [name] Dolin, [place]; Diverticulosis of the sigmoid and descending colon, otherwise normal exam.  No recommendations to repeat.   CORONARY STENT PLACEMENT  11/23/2012   Mid RCA -- Xience eXp - 2.75 mm x 18 mm DES (3.79m) , [name]  ESOPHAGOGASTRODUODENOSCOPY N/A 03/03/2021   Procedure: ESOPHAGOGASTRODUODENOSCOPY (EGD);  Surgeon: [name] Dolin [place];  [facility];  Service: Endoscopy;  Laterality: N/A;  12:00pm   IR IVC FILTER PLMT / S&I /IMG GUID/MOD SED  05/27/2019   IR RADIOLOGIST EVAL & MGMT  09/11/2019   LEFT HEART CATHETERIZATION WITH CORONARY ANGIOGRAM N/A 11/23/2012   Procedure: LEFT HEART CATHETERIZATION WITH CORONARY ANGIOGRAM;  Surgeon: [name];  [facility];  Service: Cardiovascular: NSTEMI: Culprit = mid RCA 95% & 75%; LAD ~40-50%, Small RI ~60%; EF ~60%, mild inf-basal HK      MALONEY DILATION N/A 03/03/2021   Procedure: MALONEY DILATION;  Surgeon: [name] Dolin [place];  [facility];  Service: Endoscopy;  Laterality: N/A;   TRANSTHORACIC ECHOCARDIOGRAM  January 2017; March 2017   a. EF 35-40%. Entire inferior hypokinesis and akinesis of the basal and mid inferolateral and anterolateral /distal lateral walls;; b. EF 40 and 45%. Akinesis of basal-mid inferolateral wall. Hypokinesis of entire lateral wall.     SOCIAL HISTORY:  Social History   Socioeconomic History   Marital status: Married    Spouse name:  Not on file   Number of children: Not on file   Years of education: Not on file   Highest education level: Not on file  Occupational History   Not on file  Tobacco Use   Smoking status: Former    Packs/day: 0.25    Years: 2.00    Total pack years: 0.50    Types: Cigarettes    Quit date: 06/07/1963    Years since quitting: 58.6   Smokeless tobacco: Never  Vaping Use   Vaping Use: Never used  Substance and Sexual Activity   Alcohol use: No    Alcohol/week: 0.0 standard drinks of alcohol    Comment: Occasionally drinks red wine   Drug use: No   Sexual activity: Not on file  Other Topics Concern   Not on file  Social History Narrative   Exercises 3 to 4 days a week at the fitness center for about a half an hour at a time.   "former smoker". Does not drink EtOH   Married.   Social Determinants of Health   Financial Resource Strain: Not on  file  Food Insecurity: Not on file  Transportation Needs: Not on file  Physical Activity: Not on file  Stress: Not on file  Social Connections: Not on file  Intimate Partner Violence: Not on file    FAMILY HISTORY:  Family History  Problem Relation Age of Onset   Diabetes Mother    Cancer Father        prostate   Colon cancer Neg Hx    Esophageal cancer Neg Hx    Stomach cancer Neg Hx     CURRENT MEDICATIONS:  Outpatient Encounter Medications as of 01/17/2022  Medication Sig Note   atorvastatin (LIPITOR) 40 MG tablet Take 1 tablet (40 mg total) by mouth daily.    clopidogrel (PLAVIX) 75 MG tablet TAKE 1 TABLET BY MOUTH EVERY DAY    [employer] VITAMIN B12 1000 MCG TBCR TAKE 1 TABLET BY MOUTH EVERY DAY    furosemide (LASIX) 20 MG tablet TAKE 1 TABLET BY MOUTH EVERY DAY    lisinopril (ZESTRIL) 2.5 MG tablet TAKE 1 TABLET BY MOUTH EVERY DAY    nitroGLYCERIN (NITROSTAT) 0.4 MG SL tablet Place 0.4 mg under the tongue every 5 (five) minutes as needed for chest pain. (Patient not taking: Reported on 09/17/2021) 02/25/2021: Newly prescribed, hasnt  picked up yet   pantoprazole (PROTONIX) 40 MG tablet TAKE 1 TABLET BY MOUTH EVERY DAY    No facility-administered encounter medications on file as of 01/17/2022.    ALLERGIES:  No Known Allergies   PHYSICAL EXAM:    ECOG PERFORMANCE STATUS: 1 - Symptomatic but completely ambulatory  There were no vitals filed for this visit. There were no vitals filed for this visit. Physical Exam Constitutional:      Appearance: Normal appearance. He is obese.  HENT:     Head: Normocephalic and atraumatic.     Mouth/Throat:     Mouth: Mucous membranes are moist.  Eyes:     Extraocular Movements: Extraocular movements intact.     Pupils: Pupils are equal, round, and reactive to light.  Cardiovascular:     Rate and Rhythm: Normal rate. Rhythm regularly irregular.     Pulses: Normal pulses.     Heart sounds: Normal heart sounds.  Pulmonary:     Effort: Pulmonary effort is normal.     Breath sounds: Normal breath sounds.  Abdominal:     General: Bowel sounds are normal.     Palpations: Abdomen is soft. There is no hepatomegaly or splenomegaly.     Tenderness: There is no abdominal tenderness.  Musculoskeletal:        General: No swelling.     Right lower leg: Edema (1+) present.     Left lower leg: Edema (2+) present.  Lymphadenopathy:     Head:     Right side of head: No submental, submandibular, tonsillar, preauricular, posterior auricular or occipital adenopathy.     Left side of head: No submental, submandibular, tonsillar, preauricular or posterior auricular adenopathy.     Cervical: No cervical adenopathy.     Right cervical: No superficial, deep or posterior cervical adenopathy.    Left cervical: No superficial, deep or posterior cervical adenopathy.     Upper Body:     Right upper body: No supraclavicular, axillary, pectoral or epitrochlear adenopathy.     Left upper body: No supraclavicular, axillary, pectoral or epitrochlear adenopathy.     Lower Body: No right inguinal  adenopathy. No left inguinal adenopathy.  Skin:    General: Skin is warm and  dry.  Neurological:     General: No focal deficit present.     Mental Status: He is alert and oriented to person, place, and time.  Psychiatric:        Mood and Affect: Mood normal.        Behavior: Behavior normal.     LABORATORY DATA:  I have reviewed the labs as listed.  CBC    Component Value Date/Time   WBC 17.4 (H) 01/10/2022 1019   RBC 5.06 01/10/2022 1019   HGB 14.9 01/10/2022 1019   HCT 46.8 01/10/2022 1019   PLT 196 01/10/2022 1019   MCV 92.5 01/10/2022 1019   MCH 29.4 01/10/2022 1019   MCHC 31.8 01/10/2022 1019   RDW 14.8 01/10/2022 1019   LYMPHSABS 11.1 (H) 01/10/2022 1019   MONOABS 0.5 01/10/2022 1019   EOSABS 0.2 01/10/2022 1019   BASOSABS 0.0 01/10/2022 1019      Latest Ref Rng & Units 01/10/2022   10:19 AM 09/06/2021   10:53 AM 04/26/2021   10:59 AM  CMP  Glucose 70 - 99 mg/dL 117  117  133   BUN 8 - 23 mg/dL '11  17  20   '$ Creatinine 0.61 - 1.24 mg/dL 1.80  1.91  1.54   Sodium 135 - 145 mmol/L 137  139  140   Potassium 3.5 - 5.1 mmol/L 4.1  4.5  3.7   Chloride 98 - 111 mmol/L 107  107  107   CO2 22 - 32 mmol/L '24  25  27   '$ Calcium 8.9 - 10.3 mg/dL 9.0  9.1  9.1   Total Protein 6.5 - 8.1 g/dL 6.6  6.6  6.8   Total Bilirubin 0.3 - 1.2 mg/dL 1.3  1.3  1.0   Alkaline Phos 38 - 126 U/L 74  57  64   AST 15 - 41 U/L '22  17  18   '$ ALT 0 - 44 U/L '12  11  12     '$ DIAGNOSTIC IMAGING:  I have independently reviewed the relevant imaging and discussed with the patient.  ASSESSMENT & PLAN: 1.  Chronic lymphocytic leukemia (Rai stage 0, Binet stage A) - Originally diagnosed in 2008, has been on watchful waiting since then with expected waxing and waning of his CLL - No fevers, night sweats, weight loss, or extreme fatigue.  No recurrent infections.     - No splenomegaly noted on CT abdomen/pelvis on 09/07/2017 - No lymphadenopathy or hepatosplenomegaly appreciated on exam    - Most recent  labs (01/10/2022): WBC 17.4/ALC 11.1, mildly increased from previous.  Hgb 14.9 with platelets 196.  LDH normal at 132. - PLAN: Repeat labs and RTC in 4 months.  No indication for treatment at this time.   2.  History of provoked DVT  - DVT of left lower extremity during hospitalization for COVID-19 infection in December 2020 - Anticoagulation was not started due to diverticular GI bleed at that time - IVC filter placed on 05/27/2019 - per note by interventional radiologist (Dr. Jacqulynn Cadet) on 09/11/2019, IVC filter is to be considered a permanent device - Most recent venous duplex ultrasound (09/11/2019): Chronic thrombus of left common femoral, femoral, profunda femoral, and popliteal veins. - Not on anticoagulation at this time (although he does take Plavix for CAD with stents)  - PLAN: Stable at this time with IVC filter in place    3.  Normocytic anemia, resolved - Intermittent anemia from iron deficiency and mild CKD - Received  Venofer x5 doses from 07/23/2019 through 08/05/2019 - Most recent labs (01/10/2022): Hgb normal at 14.9 with normal ferritin 123 and iron saturation 26 % - PLAN: Stable at this time, repeat labs and RTC in 4 months   4.  CKD stage IIIb - Most recent creatinine (09/06/2021) 1.91/GFR 36 - PLAN: Continue follow-up with Dr. Theador Hawthorne    All questions were answered. The patient knows to call the clinic with any problems, questions or concerns.  Medical decision making: Moderate    Time spent on visit: I spent 20 minutes counseling the patient face to face. The total time spent in the appointment was 30 minutes and more than 50% was on counseling.   Harriett Rush, PA-C   01/17/22 12:24 PM

## 2022-01-15 ENCOUNTER — Ambulatory Visit
Admission: EM | Admit: 2022-01-15 | Discharge: 2022-01-15 | Disposition: A | Discharge status: Home / Self Care | Payer: Medicare HMO | Attending: Family Medicine | Admitting: Family Medicine

## 2022-01-15 ENCOUNTER — Other Ambulatory Visit: Payer: Self-pay

## 2022-01-15 ENCOUNTER — Ambulatory Visit (INDEPENDENT_AMBULATORY_CARE_PROVIDER_SITE_OTHER): Payer: Medicare HMO

## 2022-01-15 ENCOUNTER — Encounter: Payer: Self-pay | Admitting: Emergency Medicine

## 2022-01-15 DIAGNOSIS — R195 Other fecal abnormalities: Secondary | ICD-10-CM | POA: Diagnosis not present

## 2022-01-15 DIAGNOSIS — R110 Nausea: Secondary | ICD-10-CM | POA: Diagnosis not present

## 2022-01-15 DIAGNOSIS — R109 Unspecified abdominal pain: Secondary | ICD-10-CM

## 2022-01-15 LAB — POCT URINALYSIS DIP (MANUAL ENTRY)
Bilirubin, UA: NEGATIVE
Blood, UA: NEGATIVE
Glucose, UA: NEGATIVE mg/dL
Ketones, POC UA: NEGATIVE mg/dL
Leukocytes, UA: NEGATIVE
Nitrite, UA: NEGATIVE
Protein Ur, POC: 30 mg/dL — AB
Spec Grav, UA: >=1.03 — AB (ref 1.010–1.025)
Urobilinogen, UA: 0.2 E.U./dL
pH, UA: 6 (ref 5.0–8.0)

## 2022-01-15 MED ORDER — ONDANSETRON 4 MG PO TBDP: 4.0000 mg | ORAL_TABLET | Freq: Three times a day (TID) | ORAL | 0 refills | Status: DC | PRN

## 2022-01-15 NOTE — Discharge Instructions (Signed)

## 2022-01-15 NOTE — ED Triage Notes (Signed)
Pt reports intermittent abd pain, productive cough, diarrhea for last several days.

## 2022-01-17 ENCOUNTER — Inpatient Hospital Stay: Payer: Medicare HMO | Admitting: Physician Assistant

## 2022-01-17 VITALS — BP 106/66 | HR 63 | Temp 97.9°F | Resp 18 | Ht 67.0 in | Wt 165.1 lb

## 2022-01-17 DIAGNOSIS — D649 Anemia, unspecified: Secondary | ICD-10-CM | POA: Diagnosis not present

## 2022-01-17 DIAGNOSIS — C9110 Chronic lymphocytic leukemia of B-cell type not having achieved remission: Secondary | ICD-10-CM | POA: Diagnosis not present

## 2022-01-17 NOTE — Patient Instructions (Signed)
[place] at [facility] Discharge Instructions  You were seen today by Tarri Abernethy PA-C for your CLL (chronic lymphocytic leukemia).  This is a type of slow-growing leukemia (blood cancer).  Right now, you are not having any symptoms of your leukemia and your blood counts are stable.  You do not require any treatment at this time, but we will continue to monitor you periodically to check your blood counts, symptoms, and physical exam.  Your anemia is also stable.  You do not have any concerning low blood counts at this time.  LABS: Return in 4 months for repeat labs  OTHER TESTS: None  MEDICATIONS: No changes to home medication  FOLLOW-UP APPOINTMENT: Office visit in 4 months - If you need to be seen sooner than 4 months, please call our office.  Be on the look out for "alarm symptoms" such as unexplained fever, chills, night sweats, unintentional weight loss.  If any of these occur, please call our office to schedule an appointment.  ** Thank you for trusting me with your healthcare!  I strive to provide all of my patients with quality care at each visit.  If you receive a survey for this visit, I would be so grateful to you for taking the time to provide feedback.  Thank you in advance!  ~ Pansie Guggisberg                   Dr. Derek Jack   &   Tarri Abernethy, PA-C   - - - - - - - - - - - - - - - - - -     Thank you for choosing [place] at [facility] to provide your oncology and hematology care.  To afford each patient quality time with our provider, please arrive at least 15 minutes before your scheduled appointment time.   If you have a lab appointment with the [facility] please come in thru the Main Entrance and check in at the main information desk.  You need to re-schedule your appointment should you arrive 10 or more minutes late.  We strive to give you quality time with our providers, and arriving late affects you  and other patients whose appointments are after yours.  Also, if you no show three or more times for appointments you may be dismissed from the clinic at the providers discretion.     Again, thank you for choosing [facility].  Our hope is that these requests will decrease the amount of time that you wait before being seen by our physicians.       _____________________________________________________________  Should you have questions after your visit to [facility], please contact our office at [phone] and follow the prompts.  Our office hours are 8:00 a.m. and 4:30 p.m. Monday - Friday.  Please note that voicemails left after 4:00 p.m. may not be returned until the following business day.  We are closed weekends and major holidays.  You do have access to a nurse 24-7, just call the main number to the clinic [phone] and do not press any options, hold on the line and a nurse will answer the phone.    For prescription refill requests, have your pharmacy contact our office and allow 72 hours.    Due to Covid, you will need to wear a mask upon entering the hospital. If you do not have a mask, a mask will be given  to you at the Main Entrance upon arrival. For doctor visits, patients may have 1 support person age 70 or older with them. For treatment visits, patients can not have anyone with them due to social distancing guidelines and our immunocompromised population.

## 2022-01-19 NOTE — ED Provider Notes (Signed)
[place]   [id number] 01/15/22 Arrival Time: 1245  ASSESSMENT & PLAN:  1. Abdominal discomfort   2. Nausea   3. Loose stools    Overall benign abdominal exam with vague symptoms. No clear etiology of current issue. Feels he is tolerating PO fluid intake but with mild nausea.  I have personally viewed the imaging studies ordered this visit. No acute changes on AAS. No free air. No signs of SBO.  Meds ordered this encounter  Medications   ondansetron (ZOFRAN-ODT) 4 MG disintegrating tablet    Sig: Take 1 tablet (4 mg total) by mouth every 8 (eight) hours as needed for nausea or vomiting.    Dispense:  15 tablet    Refill:  0   Reports he and his caregiver are comfortable with home observation. Has felt a little better today. U/A looks ok.    Discharge Instructions      You have been seen today for abdominal pain. Your evaluation was not suggestive of any emergent condition requiring medical intervention at this time. However, some abdominal problems make take more time to appear. Therefore, it is very important for you to pay attention to any new symptoms or worsening of your current condition.  Please return here or to the Emergency Department immediately should you begin to feel worse in any way or have any of the following symptoms: increasing or different abdominal pain, persistent vomiting, inability to drink fluids, fevers, or shaking chills.       Follow-up Information     Celene Squibb, MD.   Specialty: Internal Medicine Why: As needed. Contact information: [place] [address] [phone]         [place].   Specialty: Emergency Medicine Why: If symptoms worsen in any way. Contact information: [address] [id number] Prudy Feeler [place] [address] [phone]                Reviewed expectations re: course of current medical issues. Questions answered. Outlined signs and  symptoms indicating need for more acute intervention. Patient verbalized understanding. After Visit Summary given.   SUBJECTIVE: History from: patient and caregiver. [name] is a 76 y.o. male who presents with complaint of intermittent abdominal discomfort; slightly loose stool yesterday (none today). Coughing a little yesterday. When present, discomfort described as "slight ache", more over lower abdomen but he is unsure. This does not wake him at night. Reports normal flatus. Symptoms are gradually improving since beginning. Fever: absent. Aggravating factors: "maybe when I eat". Alleviating factors: have not been identified. Associated symptoms: slight fatigue. He denies belching, constipation, dysuria, myalgias, and sweats. Appetite: decreased. PO intake: decreased. Ambulatory without assistance. Urinary symptoms: none. No blood in stool. No tx PTA.  Past Surgical History:  Procedure Laterality Date   APPENDECTOMY  1968-70   CARDIAC CATHETERIZATION N/A 06/16/2015   Procedure: Left Heart Cath and Coronary Angiography;  Surgeon: Jettie Booze, MD;  Location: [facility];  Service: Cardiovascular: 99% thrombotic dCx -> asp thrombectomy & PCI. RCA Stents Patent.    CARDIAC CATHETERIZATION N/A 06/16/2015   Procedure: Coronary Stent Intervention;  Surgeon: Jettie Booze, MD;  Location: [facility];  Service: Cardiovascular: PCI dCx = thrombectomy -> 3.0 x 15 Xience drug-eluting stent (3.6 mm)    COLONOSCOPY  2008   [id number] diverticula, diminutive polyp in the cecum, s/p bx Normal rectum. Tubular adenoma   COLONOSCOPY N/A 10/17/2012   Dr. Gala Romney:  colonic diverticulosis, tubular adenoma, surveillance due May 2019   COLONOSCOPY N/A 02/21/2018   Procedure: COLONOSCOPY;  Surgeon: Daneil Dolin, MD; Diverticulosis of the sigmoid and descending colon, otherwise normal exam.  No recommendations to repeat.   CORONARY STENT PLACEMENT  11/23/2012   Mid RCA --  Xience eXp - 2.75 mm x 18 mm DES (3.71m) , Dr. HEllyn Hack  ESOPHAGOGASTRODUODENOSCOPY N/A 03/03/2021   Procedure: ESOPHAGOGASTRODUODENOSCOPY (EGD);  Surgeon: RDaneil Dolin MD;  [facility];  Service: Endoscopy;  Laterality: N/A;  12:00pm   IR IVC FILTER PLMT / S&I /IMG GUID/MOD SED  05/27/2019   IR RADIOLOGIST EVAL & MGMT  09/11/2019   LEFT HEART CATHETERIZATION WITH CORONARY ANGIOGRAM N/A 11/23/2012   Procedure: LEFT HEART CATHETERIZATION WITH CORONARY ANGIOGRAM;  Surgeon: DLeonie Man MD;  [facility];  Service: Cardiovascular: NSTEMI: Culprit = mid RCA 95% & 75%; LAD ~40-50%, Small RI ~60%; EF ~60%, mild inf-basal HK      MALONEY DILATION N/A 03/03/2021   Procedure: MALONEY DILATION;  Surgeon: RDaneil Dolin MD;  [facility];  Service: Endoscopy;  Laterality: N/A;   TRANSTHORACIC ECHOCARDIOGRAM  January 2017; March 2017   a. EF 35-40%. Entire inferior hypokinesis and akinesis of the basal and mid inferolateral and anterolateral /distal lateral walls;; b. EF 40 and 45%. Akinesis of basal-mid inferolateral wall. Hypokinesis of entire lateral wall.     OBJECTIVE:  Vitals:   01/15/22 1222  BP: (!) 149/75  Pulse: 62  Resp: 18  Temp: 97.6 F (36.4 C)  TempSrc: Oral  SpO2: 95%    General appearance: alert, oriented, no acute distress HEENT: [place]; AT; oropharynx moist Lungs: unlabored respirations Abdomen: soft; without distention; mild  and poorly localized tenderness to palpation over lower abdomen "but not that baad" ; normal bowel sounds; without masses or organomegaly; without guarding or rebound tenderness Back: without reported CVA tenderness; FROM at waist Extremities: without LE edema; symmetrical; without gross deformities Skin: warm and dry Neurologic: normal gait Psychological: alert and cooperative; normal mood and affect  Labs: Results for orders placed or performed during the hospital encounter of 01/15/22  POCT urinalysis dipstick   Result Value Ref Range   Color, UA yellow yellow   Clarity, UA clear clear   Glucose, UA negative negative mg/dL   Bilirubin, UA negative negative   Ketones, POC UA negative negative mg/dL   Spec Grav, UA >=1.030 (A) 1.010 - 1.025   Blood, UA negative negative   pH, UA 6.0 5.0 - 8.0   Protein Ur, POC =30 (A) negative mg/dL   Urobilinogen, UA 0.2 0.2 or 1.0 E.U./dL   Nitrite, UA Negative Negative   Leukocytes, UA Negative Negative   Labs Reviewed  POCT URINALYSIS DIP (MANUAL ENTRY) - Abnormal; Notable for the following components:      Result Value   Spec Grav, UA >=1.030 (*)    Protein Ur, POC =30 (*)    All other components within normal limits    Imaging: DG Abd Acute W/Chest  Result Date: 01/15/2022 CLINICAL DATA:  Abdominal pain, cough EXAM: DG ABDOMEN ACUTE WITH 1 VIEW CHEST COMPARISON:  Chest radiograph done on 05/27/2019 FINDINGS: Cardiac size is within normal limits. Lung fields are clear of any infiltrate or pulmonary edema. There is no pleural effusion or pneumothorax. Bowel gas pattern is nonspecific. There is no pneumoperitoneum. No abnormal masses or calcifications are seen. Kidneys are partly obscured by bowel contents. Inferior vena cava filter is  seen. Degenerative changes are noted in lumbar spine. IMPRESSION: There is no evidence of intestinal obstruction or pneumoperitoneum. No focal pulmonary infiltrates are seen. Electronically Signed   By: Elmer Picker M.D.   On: 01/15/2022 13:17     No Known Allergies                                             Past Medical History:  Diagnosis Date   Acute MI, inferolateral wall, initial episode of care ([place]) 06/16/2015   99% dCx    BPH (benign prostatic hyperplasia) 04/08/2011   CAD S/P percutaneous coronary angioplasty 11/23/12; 06/16/15   a. NSTEMI 2014 with 2 overlapping mRCA lesions - Xience Xpedition DES 2.75 mm x 18 mm (3.0 mm). b. Inf-Lat STEMI 06/16/2015 - dCx Asp Thrombectomy & PCI 3.0 x 15 Xience drDES (3.6  mm).   Chronic combined systolic and diastolic CHF (congestive heart failure) (HCC)    Chronic headache    CKD (chronic kidney disease), stage III (HCC)    CLL (chronic lymphocytic leukemia) ([place]) 04/08/2011   Depression    Diverticulitis    DVT (deep venous thrombosis) ([place]) 2021   Left, no anticoagulation due to history of diverticular bleeding, IVC filter placed.   Gallstone    GERD (gastroesophageal reflux disease)    Hypercholesteremia    Ischemic cardiomyopathy    Echo 06/18/2015 EF 35-40% after lateral MI -> repeat echo 08/06/2015: EF 40-45% with basal-mid inferolateral akinesis and hypokinesis of the lateral wall.   NSTEMI (non-ST elevated myocardial infarction) ([place]) 11/23/2012   NSTEMI 11/23/2012 RCA lesion - PCI with DES; moderate LAD disease   PTSD (post-traumatic stress disorder)    With Depression - since MI    Social History   Socioeconomic History   Marital status: Married    Spouse name: Not on file   Number of children: Not on file   Years of education: Not on file   Highest education level: Not on file  Occupational History   Not on file  Tobacco Use   Smoking status: Former    Packs/day: 0.25    Years: 2.00    Total pack years: 0.50    Types: Cigarettes    Quit date: 06/07/1963    Years since quitting: 58.6   Smokeless tobacco: Never  Vaping Use   Vaping Use: Never used  Substance and Sexual Activity   Alcohol use: No    Alcohol/week: 0.0 standard drinks of alcohol    Comment: Occasionally drinks red wine   Drug use: No   Sexual activity: Not on file  Other Topics Concern   Not on file  Social History Narrative   Exercises 3 to 4 days a week at the fitness center for about a half an hour at a time.   "former smoker". Does not drink EtOH   Married.   Social Determinants of Health   Financial Resource Strain: Not on file  Food Insecurity: Not on file  Transportation Needs: Not on file  Physical Activity: Not on file  Stress: Not on file   Social Connections: Not on file  Intimate Partner Violence: Not on file    Family History  Problem Relation Age of Onset   Diabetes Mother    Cancer Father        prostate   Colon cancer Neg Hx    Esophageal cancer  Neg Hx    Stomach cancer Neg Hx      Vanessa Kick, MD 01/19/22 [phone]

## 2022-02-04 ENCOUNTER — Other Ambulatory Visit (INDEPENDENT_AMBULATORY_CARE_PROVIDER_SITE_OTHER): Payer: Self-pay | Admitting: Pediatrics

## 2022-02-17 DIAGNOSIS — F0390 Unspecified dementia without behavioral disturbance: Secondary | ICD-10-CM | POA: Diagnosis not present

## 2022-02-17 DIAGNOSIS — R69 Illness, unspecified: Secondary | ICD-10-CM | POA: Diagnosis not present

## 2022-02-17 DIAGNOSIS — R7301 Impaired fasting glucose: Secondary | ICD-10-CM | POA: Insufficient documentation

## 2022-02-28 NOTE — Progress Notes (Unsigned)
Referring Provider: Celene Squibb, MD Primary Care Physician:  Celene Squibb, MD Primary GI Physician: Dr. Gala Romney  No chief complaint on file.   HPI:   [name] is a 76 y.o. male with GI history of colonic adenomas, prior GI bleeding thought to be diverticular in origin, GERD, dysphagia.  Last colonoscopy in September 2019 with diverticulosis of the sigmoid and descending colon, otherwise normal exam with no recommendations to repeat.  EGD 03/03/2021 for dysphagia with grade B esophagitis, Schatzki's ring dilated, small hiatal hernia.  Recommended starting Protonix 40 mg daily and returning in 1 year.  We have not seen patient since the time of his EGD. He is presenting today   Today:    Past Medical History:  Diagnosis Date   Acute MI, inferolateral wall, initial episode of care ([place]) 06/16/2015   99% dCx    BPH (benign prostatic hyperplasia) 04/08/2011   CAD S/P percutaneous coronary angioplasty 11/23/12; 06/16/15   a. NSTEMI 2014 with 2 overlapping mRCA lesions - Xience Xpedition DES 2.75 mm x 18 mm (3.0 mm). b. Inf-Lat STEMI 06/16/2015 - dCx Asp Thrombectomy & PCI 3.0 x 15 Xience drDES (3.6 mm).   Chronic combined systolic and diastolic CHF (congestive heart failure) (HCC)    Chronic headache    CKD (chronic kidney disease), stage III (HCC)    CLL (chronic lymphocytic leukemia) ([place]) 04/08/2011   Depression    Diverticulitis    DVT (deep venous thrombosis) ([place]) 2021   Left, no anticoagulation due to history of diverticular bleeding, IVC filter placed.   Gallstone    GERD (gastroesophageal reflux disease)    Hypercholesteremia    Ischemic cardiomyopathy    Echo 06/18/2015 EF 35-40% after lateral MI -> repeat echo 08/06/2015: EF 40-45% with basal-mid inferolateral akinesis and hypokinesis of the lateral wall.   NSTEMI (non-ST elevated myocardial infarction) ([place]) 11/23/2012   NSTEMI 11/23/2012 RCA lesion - PCI with DES; moderate LAD disease   PTSD (post-traumatic stress  disorder)    With Depression - since MI    Past Surgical History:  Procedure Laterality Date   APPENDECTOMY  1968-70   CARDIAC CATHETERIZATION N/A 06/16/2015   Procedure: Left Heart Cath and Coronary Angiography;  Surgeon: Jettie Booze, MD;  Location: [facility];  Service: Cardiovascular: 99% thrombotic dCx -> asp thrombectomy & PCI. RCA Stents Patent.    CARDIAC CATHETERIZATION N/A 06/16/2015   Procedure: Coronary Stent Intervention;  Surgeon: Jettie Booze, MD;  Location: [facility];  Service: Cardiovascular: PCI dCx = thrombectomy -> 3.0 x 15 Xience drug-eluting stent (3.6 mm)    COLONOSCOPY  2008   [id number] diverticula, diminutive polyp in the cecum, s/p bx Normal rectum. Tubular adenoma   COLONOSCOPY N/A 10/17/2012   Dr. Gala Romney: colonic diverticulosis, tubular adenoma, surveillance due May 2019   COLONOSCOPY N/A 02/21/2018   Procedure: COLONOSCOPY;  Surgeon: Daneil Dolin, MD; Diverticulosis of the sigmoid and descending colon, otherwise normal exam.  No recommendations to repeat.   CORONARY STENT PLACEMENT  11/23/2012   Mid RCA -- Xience eXp - 2.75 mm x 18 mm DES (3.85m) , Dr. HEllyn Hack  ESOPHAGOGASTRODUODENOSCOPY N/A 03/03/2021   Procedure: ESOPHAGOGASTRODUODENOSCOPY (EGD);  Surgeon: RDaneil Dolin MD;  [facility];  Service: Endoscopy;  Laterality: N/A;  12:00pm   IR IVC FILTER PLMT / S&I /IMG GUID/MOD SED  05/27/2019   IR RADIOLOGIST EVAL & MGMT  09/11/2019   LEFT HEART CATHETERIZATION WITH  CORONARY ANGIOGRAM N/A 11/23/2012   Procedure: LEFT HEART CATHETERIZATION WITH CORONARY ANGIOGRAM;  Surgeon: Leonie Man, MD;  Location: [facility];  Service: Cardiovascular: NSTEMI: Culprit = mid RCA 95% & 75%; LAD ~40-50%, Small RI ~60%; EF ~60%, mild inf-basal HK      MALONEY DILATION N/A 03/03/2021   Procedure: MALONEY DILATION;  Surgeon: Daneil Dolin, MD;  [facility];  Service: Endoscopy;  Laterality: N/A;   TRANSTHORACIC  ECHOCARDIOGRAM  January 2017; March 2017   a. EF 35-40%. Entire inferior hypokinesis and akinesis of the basal and mid inferolateral and anterolateral /distal lateral walls;; b. EF 40 and 45%. Akinesis of basal-mid inferolateral wall. Hypokinesis of entire lateral wall.    Current Outpatient Medications  Medication Sig Dispense Refill   atorvastatin (LIPITOR) 40 MG tablet Take 1 tablet (40 mg total) by mouth daily. 90 tablet 1   clopidogrel (PLAVIX) 75 MG tablet TAKE 1 TABLET BY MOUTH EVERY DAY 90 tablet 1   [employer] VITAMIN B12 1000 MCG TBCR TAKE 1 TABLET BY MOUTH EVERY DAY 60 tablet 3   furosemide (LASIX) 20 MG tablet TAKE 1 TABLET BY MOUTH EVERY DAY 90 tablet 1   lisinopril (ZESTRIL) 2.5 MG tablet TAKE 1 TABLET BY MOUTH EVERY DAY 90 tablet 3   memantine (NAMENDA) 5 MG tablet Take 5 mg by mouth 2 (two) times daily.     nitroGLYCERIN (NITROSTAT) 0.4 MG SL tablet Place 0.4 mg under the tongue every 5 (five) minutes as needed for chest pain.     ondansetron (ZOFRAN-ODT) 4 MG disintegrating tablet Take 1 tablet (4 mg total) by mouth every 8 (eight) hours as needed for nausea or vomiting. 15 tablet 0   pantoprazole (PROTONIX) 40 MG tablet TAKE 1 TABLET BY MOUTH EVERY DAY 90 tablet 2   No current facility-administered medications for this visit.    Allergies as of 03/03/2022   (No Known Allergies)    Family History  Problem Relation Age of Onset   Diabetes Mother    Cancer Father        prostate   Colon cancer Neg Hx    Esophageal cancer Neg Hx    Stomach cancer Neg Hx     Social History   Socioeconomic History   Marital status: Married    Spouse name: Not on file   Number of children: Not on file   Years of education: Not on file   Highest education level: Not on file  Occupational History   Not on file  Tobacco Use   Smoking status: Former    Packs/day: 0.25    Years: 2.00    Total pack years: 0.50    Types: Cigarettes    Quit date: 06/07/1963    Years since quitting: 58.7    Smokeless tobacco: Never  Vaping Use   Vaping Use: Never used  Substance and Sexual Activity   Alcohol use: No    Alcohol/week: 0.0 standard drinks of alcohol    Comment: Occasionally drinks red wine   Drug use: No   Sexual activity: Not on file  Other Topics Concern   Not on file  Social History Narrative   Exercises 3 to 4 days a week at the fitness center for about a half an hour at a time.   "former smoker". Does not drink EtOH   Married.   Social Determinants of Health   Financial Resource Strain: Not on file  Food Insecurity: Not on file  Transportation Needs:  Not on file  Physical Activity: Not on file  Stress: Not on file  Social Connections: Not on file    Review of Systems: Gen: Denies fever, chills, anorexia. Denies fatigue, weakness, weight loss.  CV: Denies chest pain, palpitations, syncope, peripheral edema, and claudication. Resp: Denies dyspnea at rest, cough, wheezing, coughing up blood, and pleurisy. GI: Denies vomiting blood, jaundice, and fecal incontinence.   Denies dysphagia or odynophagia. Derm: Denies rash, itching, dry skin Psych: Denies depression, anxiety, memory loss, confusion. No homicidal or suicidal ideation.  Heme: Denies bruising, bleeding, and enlarged lymph nodes.  Physical Exam: There were no vitals taken for this visit. General:   Alert and oriented. No distress noted. Pleasant and cooperative.  Head:  Normocephalic and atraumatic. Eyes:  Conjuctiva clear without scleral icterus. Heart:  S1, S2 present without murmurs appreciated. Lungs:  Clear to auscultation bilaterally. No wheezes, rales, or rhonchi. No distress.  Abdomen:  +BS, soft, non-tender and non-distended. No rebound or guarding. No HSM or masses noted. Msk:  Symmetrical without gross deformities. Normal posture. Extremities:  Without edema. Neurologic:  Alert and  oriented x4 Psych:  Normal mood and affect.    Assessment:     Plan:  ***   Aliene Altes,  PA-C [facility] 03/03/2022

## 2022-03-01 DIAGNOSIS — I739 Peripheral vascular disease, unspecified: Secondary | ICD-10-CM | POA: Diagnosis not present

## 2022-03-01 DIAGNOSIS — L603 Nail dystrophy: Secondary | ICD-10-CM | POA: Diagnosis not present

## 2022-03-03 ENCOUNTER — Ambulatory Visit: Payer: Medicare HMO | Admitting: Gastroenterology

## 2022-03-03 ENCOUNTER — Encounter: Payer: Self-pay | Admitting: Gastroenterology

## 2022-03-03 VITALS — BP 136/77 | HR 82 | Temp 98.2°F | Ht 62.0 in | Wt 161.4 lb

## 2022-03-03 DIAGNOSIS — R1310 Dysphagia, unspecified: Secondary | ICD-10-CM

## 2022-03-03 DIAGNOSIS — K219 Gastro-esophageal reflux disease without esophagitis: Secondary | ICD-10-CM

## 2022-03-03 NOTE — Patient Instructions (Signed)
Continue pantoprazole 40 mg daily 30 minutes before breakfast for reflux.   We will plan to follow-up with you as needed.  You can obtain further refills of pantoprazole from Dr. Nevada Crane as you requested.  However, if you have any recurrent GI problems, feel free to reach out to [place] and we will be glad to see you again.  It was great to see you again today!   Aliene Altes, PA-C [facility]

## 2022-03-12 ENCOUNTER — Other Ambulatory Visit: Payer: Self-pay

## 2022-03-12 ENCOUNTER — Encounter: Payer: Self-pay | Admitting: Emergency Medicine

## 2022-03-12 ENCOUNTER — Ambulatory Visit
Admission: EM | Admit: 2022-03-12 | Discharge: 2022-03-12 | Disposition: A | Discharge status: Home / Self Care | Payer: Medicare HMO | Attending: Family Medicine | Admitting: Family Medicine

## 2022-03-12 DIAGNOSIS — R197 Diarrhea, unspecified: Secondary | ICD-10-CM

## 2022-03-12 DIAGNOSIS — R059 Cough, unspecified: Secondary | ICD-10-CM | POA: Insufficient documentation

## 2022-03-12 DIAGNOSIS — Z20822 Contact with and (suspected) exposure to covid-19: Secondary | ICD-10-CM | POA: Diagnosis not present

## 2022-03-12 DIAGNOSIS — J069 Acute upper respiratory infection, unspecified: Secondary | ICD-10-CM | POA: Diagnosis not present

## 2022-03-12 DIAGNOSIS — R112 Nausea with vomiting, unspecified: Secondary | ICD-10-CM | POA: Insufficient documentation

## 2022-03-12 LAB — RESP PANEL BY RT-PCR (FLU A&B, COVID) ARPGX2
Influenza A by PCR: NEGATIVE
Influenza B by PCR: NEGATIVE
SARS Coronavirus 2 by RT PCR: NEGATIVE

## 2022-03-12 MED ORDER — ONDANSETRON 4 MG PO TBDP: 4.0000 mg | ORAL_TABLET | Freq: Three times a day (TID) | ORAL | 0 refills | Status: DC | PRN

## 2022-03-12 NOTE — Discharge Instructions (Signed)
We have tested you today for COVID and flu.  Someone should be in contact if either of these come back positive and we can discuss antiviral therapy from there.  In the meantime, I have sent over more nausea medication, and you may take over-the-counter cold and congestion medications, Flonase nasal spray, Mucinex, stay well-hydrated and get lots of rest.  Follow-up if your breathing starts worsening.

## 2022-03-12 NOTE — ED Triage Notes (Signed)
Pt reports emesis, diarrhea since this am. Pt also reports intermittent burning sensation in chest. Pt has taken imodium and zofran this am.

## 2022-03-12 NOTE — ED Provider Notes (Signed)
[facility]    CSN: [id number] Arrival date & time: 03/12/22  0831      History   Chief Complaint Chief Complaint  Patient presents with   Emesis    HPI [name] is a 76 y.o. male.   Presenting today with 1 day history of nausea, vomiting, diarrhea, cough, sore throat, congestion, headache, chills.  Denies fever, chest pain, shortness of breath, abdominal pain.  So far taking Imodium and Zofran with mild temporary relief of his GI concerns.  No known sick contacts recently.  No known chronic pulmonary disease.    Past Medical History:  Diagnosis Date   Acute MI, inferolateral wall, initial episode of care ([place]) 06/16/2015   99% dCx    BPH (benign prostatic hyperplasia) 04/08/2011   CAD S/P percutaneous coronary angioplasty 11/23/12; 06/16/15   a. NSTEMI 2014 with 2 overlapping mRCA lesions - Xience Xpedition DES 2.75 mm x 18 mm (3.0 mm). b. Inf-Lat STEMI 06/16/2015 - dCx Asp Thrombectomy & PCI 3.0 x 15 Xience drDES (3.6 mm).   Chronic combined systolic and diastolic CHF (congestive heart failure) (HCC)    Chronic headache    CKD (chronic kidney disease), stage III (HCC)    CLL (chronic lymphocytic leukemia) ([place]) 04/08/2011   Dementia ([place])    Depression    Diverticulitis    DVT (deep venous thrombosis) ([place]) 2021   Left, no anticoagulation due to history of diverticular bleeding, IVC filter placed.   Gallstone    GERD (gastroesophageal reflux disease)    Hypercholesteremia    Ischemic cardiomyopathy    Echo 06/18/2015 EF 35-40% after lateral MI -> repeat echo 08/06/2015: EF 40-45% with basal-mid inferolateral akinesis and hypokinesis of the lateral wall.   NSTEMI (non-ST elevated myocardial infarction) ([place]) 11/23/2012   NSTEMI 11/23/2012 RCA lesion - PCI with DES; moderate LAD disease   PTSD (post-traumatic stress disorder)    With Depression - since MI    Patient Active Problem List   Diagnosis Date Noted   Coronary artery disease involving native  coronary artery of native heart without angina pectoris 08/02/2020   Iron deficiency anemia due to chronic blood loss 07/22/2019   Normocytic anemia 07/16/2019   Weakness 07/09/2019   Memory loss 07/09/2019   Rash 07/09/2019   Gastrointestinal hemorrhage associated with intestinal diverticulosis    Acute respiratory failure with hypoxia ([place]) 05/22/2019   Sepsis ([place]) 05/15/2019   Community acquired pneumonia 05/15/2019   Pneumonia due to COVID-19 virus 05/15/2019   COVID-19 05/15/2019   Abdominal pain 03/06/2018   Constipation 03/06/2018   Gastroesophageal reflux disease 03/08/2016   Dysphagia 03/08/2016   Chronic cough 03/08/2016   Ischemic cardiomyopathy    STEMI - 06/16/15 06/16/2015   Essential hypertension 06/16/2015   Myocardial infarction of inferolateral wall ([place]) 06/16/2015   Cholelithiasis without cholecystitis 12/18/2013   Hematochezia 06/04/2013   Lower leg edema 03/14/2013   PTSD (post-traumatic stress disorder)    CAD S/P PCI-- RCA DES 2014,  CFX DES Jan 2017 11/24/2012   NSTEMI (non-ST elevated myocardial infarction) - history of 11/23/2012   Hyperglycemia 11/23/2012   History of smoking 11/23/2012   Dyslipidemia, goal LDL below 70 - on simvastatin; monitored by PCP 11/23/2012   Diverticulitis of colon without hemorrhage- on antibiotics for recent flair up 09/19/2012   Hx of adenomatous colonic polyps 09/19/2012   CLL (chronic lymphocytic leukemia) ([place]) 04/08/2011   BPH (benign prostatic hyperplasia) 04/08/2011    Past Surgical History:  Procedure Laterality Date  APPENDECTOMY  1968-70   CARDIAC CATHETERIZATION N/A 06/16/2015   Procedure: Left Heart Cath and Coronary Angiography;  Surgeon: Jettie Booze, MD;  Location: [facility];  Service: Cardiovascular: 99% thrombotic dCx -> asp thrombectomy & PCI. RCA Stents Patent.    CARDIAC CATHETERIZATION N/A 06/16/2015   Procedure: Coronary Stent Intervention;  Surgeon: Jettie Booze, MD;   Location: [facility];  Service: Cardiovascular: PCI dCx = thrombectomy -> 3.0 x 15 Xience drug-eluting stent (3.6 mm)    COLONOSCOPY  2008   XHB:[id number]-CVELF diverticula, diminutive polyp in the cecum, s/p bx Normal rectum. Tubular adenoma   COLONOSCOPY N/A 10/17/2012   Dr. Gala Romney: colonic diverticulosis, tubular adenoma, surveillance due May 2019   COLONOSCOPY N/A 02/21/2018   Procedure: COLONOSCOPY;  Surgeon: Daneil Dolin, MD; Diverticulosis of the sigmoid and descending colon, otherwise normal exam.  No recommendations to repeat.   CORONARY STENT PLACEMENT  11/23/2012   Mid RCA -- Xience eXp - 2.75 mm x 18 mm DES (3.90m) , Dr. HEllyn Hack  ESOPHAGOGASTRODUODENOSCOPY N/A 03/03/2021   Procedure: ESOPHAGOGASTRODUODENOSCOPY (EGD);  Surgeon: RDaneil Dolin MD;  [facility];  Service: Endoscopy;  Laterality: N/A;  12:00pm   IR IVC FILTER PLMT / S&I /IMG GUID/MOD SED  05/27/2019   IR RADIOLOGIST EVAL & MGMT  09/11/2019   LEFT HEART CATHETERIZATION WITH CORONARY ANGIOGRAM N/A 11/23/2012   Procedure: LEFT HEART CATHETERIZATION WITH CORONARY ANGIOGRAM;  Surgeon: DLeonie Man MD;  [facility];  Service: Cardiovascular: NSTEMI: Culprit = mid RCA 95% & 75%; LAD ~40-50%, Small RI ~60%; EF ~60%, mild inf-basal HK      MALONEY DILATION N/A 03/03/2021   Procedure: MALONEY DILATION;  Surgeon: RDaneil Dolin MD;  [facility];  Service: Endoscopy;  Laterality: N/A;   TRANSTHORACIC ECHOCARDIOGRAM  January 2017; March 2017   a. EF 35-40%. Entire inferior hypokinesis and akinesis of the basal and mid inferolateral and anterolateral /distal lateral walls;; b. EF 40 and 45%. Akinesis of basal-mid inferolateral wall. Hypokinesis of entire lateral wall.       Home Medications    Prior to Admission medications   Medication Sig Start Date End Date Taking? Authorizing Provider  atorvastatin (LIPITOR) 40 MG tablet Take 1 tablet (40 mg total) by mouth daily. 11/19/20  03/03/22  GAilene Ards NP  clopidogrel (PLAVIX) 75 MG tablet TAKE 1 TABLET BY MOUTH EVERY DAY 11/05/20   GHurshel PartyC, MD  [facility] VITAMIN B12 1000 MCG TBCR TAKE 1 TABLET BY MOUTH EVERY DAY 09/08/20   GHurshel PartyC, MD  furosemide (LASIX) 20 MG tablet TAKE 1 TABLET BY MOUTH EVERY DAY 11/05/20   GHurshel PartyC, MD  lisinopril (ZESTRIL) 2.5 MG tablet TAKE 1 TABLET BY MOUTH EVERY DAY 12/18/20   HLeonie Man MD  memantine (NAMENDA) 5 MG tablet Take 5 mg by mouth 2 (two) times daily. 12/11/21   [provider]  nitroGLYCERIN (NITROSTAT) 0.4 MG SL tablet Place 0.4 mg under the tongue every 5 (five) minutes as needed for chest pain. Patient not taking: Reported on 03/03/2022    [provider]  ondansetron (ZOFRAN-ODT) 4 MG disintegrating tablet Take 1 tablet (4 mg total) by mouth every 8 (eight) hours as needed for nausea or vomiting. 03/12/22   LVolney American PA-C  pantoprazole (PROTONIX) 40 MG tablet TAKE 1 TABLET BY MOUTH EVERY DAY 08/24/21   HErenest Rasher PA-C    Family History Family  History  Problem Relation Age of Onset   Diabetes Mother    Cancer Father        prostate   Colon cancer Neg Hx    Esophageal cancer Neg Hx    Stomach cancer Neg Hx     Social History Social History   Tobacco Use   Smoking status: Former    Packs/day: 0.25    Years: 2.00    Total pack years: 0.50    Types: Cigarettes    Quit date: 06/07/1963    Years since quitting: 58.8   Smokeless tobacco: Never  Vaping Use   Vaping Use: Never used  Substance Use Topics   Alcohol use: No    Alcohol/week: 0.0 standard drinks of alcohol    Comment: Occasionally drinks red wine   Drug use: No     Allergies   Patient has no known allergies.   Review of Systems Review of Systems PER HPI  Physical Exam Triage Vital Signs ED Triage Vitals  Enc Vitals Group     BP 03/12/22 0849 126/79     Pulse Rate 03/12/22 0849 88     Resp 03/12/22 0849 20     Temp 03/12/22 0849 97.9  F (36.6 C)     Temp Source 03/12/22 0849 Oral     SpO2 03/12/22 0849 97 %     Weight --      Height --      Head Circumference --      Peak Flow --      Pain Score 03/12/22 0850 2     Pain Loc --      Pain Edu? --      Excl. in [place]? --    No data found.  Updated Vital Signs BP 126/79 (BP Location: Right Arm)   Pulse 88   Temp 97.9 F (36.6 C) (Oral)   Resp 20   SpO2 97%   Visual Acuity Right Eye Distance:   Left Eye Distance:   Bilateral Distance:    Right Eye Near:   Left Eye Near:    Bilateral Near:     Physical Exam Vitals and nursing note reviewed.  Constitutional:      Appearance: He is well-developed.  HENT:     Head: Atraumatic.     Right Ear: External ear normal.     Left Ear: External ear normal.     Nose: Rhinorrhea present.     Mouth/Throat:     Pharynx: Posterior oropharyngeal erythema present. No oropharyngeal exudate.  Eyes:     Conjunctiva/sclera: Conjunctivae normal.     Pupils: Pupils are equal, round, and reactive to light.  Cardiovascular:     Rate and Rhythm: Normal rate and regular rhythm.  Pulmonary:     Effort: Pulmonary effort is normal. No respiratory distress.     Breath sounds: No wheezing or rales.  Abdominal:     General: Bowel sounds are normal. There is no distension.     Palpations: Abdomen is soft.     Tenderness: There is no abdominal tenderness. There is no right CVA tenderness, left CVA tenderness or guarding.  Musculoskeletal:        General: Normal range of motion.     Cervical back: Normal range of motion and neck supple.  Lymphadenopathy:     Cervical: No cervical adenopathy.  Skin:    General: Skin is warm and dry.  Neurological:     Mental Status: He is alert and oriented to  person, place, and time.  Psychiatric:        Behavior: Behavior normal.    UC Treatments / Results  Labs (all labs ordered are listed, but only abnormal results are displayed) Labs Reviewed  RESP PANEL BY RT-PCR (FLU A&B, COVID)  ARPGX2    EKG   Radiology No results found.  Procedures Procedures (including critical care time)  Medications Ordered in UC Medications - No data to display  Initial Impression / Assessment and Plan / UC Course  I have reviewed the triage vital signs and the nursing notes.  Pertinent labs & imaging results that were available during my care of the patient were reviewed by me and considered in my medical decision making (see chart for details).     Suspicious for COVID-19 or other similar viral illness, respiratory panel pending, vitals and exam overall reassuring and he is in no acute distress.  We will refill Zofran to help him stay hydrated and tolerate p.o., discussed supportive over-the-counter medications at home care and will consider molnupiravir if COVID-positive.  Return for worsening symptoms.  Final Clinical Impressions(s) / UC Diagnoses   Final diagnoses:  Viral URI with cough  Nausea vomiting and diarrhea     Discharge Instructions      We have tested you today for COVID and flu.  Someone should be in contact if either of these come back positive and we can discuss antiviral therapy from there.  In the meantime, I have sent over more nausea medication, and you may take over-the-counter cold and congestion medications, Flonase nasal spray, Mucinex, stay well-hydrated and get lots of rest.  Follow-up if your breathing starts worsening.    ED Prescriptions     Medication Sig Dispense Auth. Provider   ondansetron (ZOFRAN-ODT) 4 MG disintegrating tablet Take 1 tablet (4 mg total) by mouth every 8 (eight) hours as needed for nausea or vomiting. 15 tablet Volney American, [place]      PDMP not reviewed this encounter.   Merrie Roof Tab, [place] 03/12/22 [phone]

## 2022-03-21 DIAGNOSIS — U071 COVID-19: Secondary | ICD-10-CM | POA: Diagnosis not present

## 2022-03-21 DIAGNOSIS — C9110 Chronic lymphocytic leukemia of B-cell type not having achieved remission: Secondary | ICD-10-CM | POA: Diagnosis not present

## 2022-03-29 DIAGNOSIS — D472 Monoclonal gammopathy: Secondary | ICD-10-CM | POA: Diagnosis not present

## 2022-03-29 DIAGNOSIS — N1832 Chronic kidney disease, stage 3b: Secondary | ICD-10-CM | POA: Diagnosis not present

## 2022-03-29 DIAGNOSIS — I5042 Chronic combined systolic (congestive) and diastolic (congestive) heart failure: Secondary | ICD-10-CM | POA: Diagnosis not present

## 2022-03-29 DIAGNOSIS — I129 Hypertensive chronic kidney disease with stage 1 through stage 4 chronic kidney disease, or unspecified chronic kidney disease: Secondary | ICD-10-CM | POA: Diagnosis not present

## 2022-03-29 DIAGNOSIS — C9190 Lymphoid leukemia, unspecified not having achieved remission: Secondary | ICD-10-CM | POA: Diagnosis not present

## 2022-03-29 DIAGNOSIS — E211 Secondary hyperparathyroidism, not elsewhere classified: Secondary | ICD-10-CM | POA: Diagnosis not present

## 2022-04-21 DIAGNOSIS — U071 COVID-19: Secondary | ICD-10-CM | POA: Diagnosis not present

## 2022-04-21 DIAGNOSIS — C9110 Chronic lymphocytic leukemia of B-cell type not having achieved remission: Secondary | ICD-10-CM | POA: Diagnosis not present

## 2022-05-10 DIAGNOSIS — L603 Nail dystrophy: Secondary | ICD-10-CM | POA: Diagnosis not present

## 2022-05-10 DIAGNOSIS — I739 Peripheral vascular disease, unspecified: Secondary | ICD-10-CM | POA: Diagnosis not present

## 2022-05-10 DIAGNOSIS — H5203 Hypermetropia, bilateral: Secondary | ICD-10-CM | POA: Diagnosis not present

## 2022-05-11 ENCOUNTER — Other Ambulatory Visit: Payer: Self-pay

## 2022-05-12 ENCOUNTER — Inpatient Hospital Stay: Payer: Medicare HMO | Attending: Hematology

## 2022-05-12 DIAGNOSIS — C9110 Chronic lymphocytic leukemia of B-cell type not having achieved remission: Secondary | ICD-10-CM | POA: Diagnosis not present

## 2022-05-12 DIAGNOSIS — R6881 Early satiety: Secondary | ICD-10-CM | POA: Diagnosis not present

## 2022-05-12 DIAGNOSIS — Z7902 Long term (current) use of antithrombotics/antiplatelets: Secondary | ICD-10-CM | POA: Diagnosis not present

## 2022-05-12 DIAGNOSIS — Z79899 Other long term (current) drug therapy: Secondary | ICD-10-CM | POA: Insufficient documentation

## 2022-05-12 DIAGNOSIS — Z86718 Personal history of other venous thrombosis and embolism: Secondary | ICD-10-CM | POA: Insufficient documentation

## 2022-05-12 DIAGNOSIS — I5042 Chronic combined systolic (congestive) and diastolic (congestive) heart failure: Secondary | ICD-10-CM | POA: Insufficient documentation

## 2022-05-12 DIAGNOSIS — Z87891 Personal history of nicotine dependence: Secondary | ICD-10-CM | POA: Diagnosis not present

## 2022-05-12 DIAGNOSIS — N183 Chronic kidney disease, stage 3 unspecified: Secondary | ICD-10-CM | POA: Diagnosis not present

## 2022-05-12 LAB — CBC WITH DIFFERENTIAL/PLATELET
Abs Immature Granulocytes: 0.04 10*3/uL (ref 0.00–0.07)
Basophils Absolute: 0.1 10*3/uL (ref 0.0–0.1)
Basophils Relative: 0 %
Eosinophils Absolute: 0.1 10*3/uL (ref 0.0–0.5)
Eosinophils Relative: 1 %
HCT: 45.6 % (ref 39.0–52.0)
Hemoglobin: 14.6 g/dL (ref 13.0–17.0)
Immature Granulocytes: 0 %
Lymphocytes Relative: 78 %
Lymphs Abs: 15.4 10*3/uL — ABNORMAL HIGH (ref 0.7–4.0)
MCH: 30.5 pg (ref 26.0–34.0)
MCHC: 32 g/dL (ref 30.0–36.0)
MCV: 95.2 fL (ref 80.0–100.0)
Monocytes Absolute: 0.3 10*3/uL (ref 0.1–1.0)
Monocytes Relative: 2 %
Neutro Abs: 3.9 10*3/uL (ref 1.7–7.7)
Neutrophils Relative %: 19 %
Platelets: 159 10*3/uL (ref 150–400)
RBC: 4.79 MIL/uL (ref 4.22–5.81)
RDW: 15 % (ref 11.5–15.5)
WBC Morphology: REACTIVE
WBC: 19.9 10*3/uL — ABNORMAL HIGH (ref 4.0–10.5)
nRBC: 0.1 % (ref 0.0–0.2)

## 2022-05-12 LAB — COMPREHENSIVE METABOLIC PANEL
ALT: 13 U/L (ref 0–44)
AST: 20 U/L (ref 15–41)
Albumin: 3.6 g/dL (ref 3.5–5.0)
Alkaline Phosphatase: 77 U/L (ref 38–126)
Anion gap: 6 (ref 5–15)
BUN: 14 mg/dL (ref 8–23)
CO2: 22 mmol/L (ref 22–32)
Calcium: 8.8 mg/dL — ABNORMAL LOW (ref 8.9–10.3)
Chloride: 108 mmol/L (ref 98–111)
Creatinine, Ser: 1.45 mg/dL — ABNORMAL HIGH (ref 0.61–1.24)
GFR, Estimated: 49 mL/min — ABNORMAL LOW (ref >60)
Glucose, Bld: 133 mg/dL — ABNORMAL HIGH (ref 70–99)
Potassium: 4 mmol/L (ref 3.5–5.1)
Sodium: 136 mmol/L (ref 135–145)
Total Bilirubin: 0.9 mg/dL (ref 0.3–1.2)
Total Protein: 6.1 g/dL — ABNORMAL LOW (ref 6.5–8.1)

## 2022-05-12 LAB — LACTATE DEHYDROGENASE: LDH: 117 U/L (ref 98–192)

## 2022-05-18 ENCOUNTER — Other Ambulatory Visit: Payer: Self-pay | Admitting: Gastroenterology

## 2022-05-19 ENCOUNTER — Other Ambulatory Visit: Payer: Self-pay

## 2022-05-19 ENCOUNTER — Inpatient Hospital Stay (HOSPITAL_BASED_OUTPATIENT_CLINIC_OR_DEPARTMENT_OTHER): Payer: Medicare HMO | Admitting: Physician Assistant

## 2022-05-19 VITALS — BP 120/79 | HR 72 | Temp 97.4°F | Resp 16 | Wt 156.5 lb

## 2022-05-19 DIAGNOSIS — Z86718 Personal history of other venous thrombosis and embolism: Secondary | ICD-10-CM | POA: Diagnosis not present

## 2022-05-19 DIAGNOSIS — C9110 Chronic lymphocytic leukemia of B-cell type not having achieved remission: Secondary | ICD-10-CM | POA: Diagnosis not present

## 2022-05-19 DIAGNOSIS — D649 Anemia, unspecified: Secondary | ICD-10-CM

## 2022-05-19 DIAGNOSIS — N183 Chronic kidney disease, stage 3 unspecified: Secondary | ICD-10-CM | POA: Diagnosis not present

## 2022-05-19 DIAGNOSIS — R6881 Early satiety: Secondary | ICD-10-CM | POA: Diagnosis not present

## 2022-05-19 DIAGNOSIS — Z87891 Personal history of nicotine dependence: Secondary | ICD-10-CM | POA: Diagnosis not present

## 2022-05-19 DIAGNOSIS — I5042 Chronic combined systolic (congestive) and diastolic (congestive) heart failure: Secondary | ICD-10-CM | POA: Diagnosis not present

## 2022-05-19 DIAGNOSIS — D500 Iron deficiency anemia secondary to blood loss (chronic): Secondary | ICD-10-CM

## 2022-05-19 DIAGNOSIS — Z7902 Long term (current) use of antithrombotics/antiplatelets: Secondary | ICD-10-CM | POA: Diagnosis not present

## 2022-05-19 DIAGNOSIS — R1032 Left lower quadrant pain: Secondary | ICD-10-CM

## 2022-05-19 DIAGNOSIS — Z79899 Other long term (current) drug therapy: Secondary | ICD-10-CM | POA: Diagnosis not present

## 2022-05-19 NOTE — Progress Notes (Signed)
[name], [place] [address]   CLINIC:  Medical Oncology/Hematology  PCP:  Celene Squibb, MD [address] 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 76 y.o. male returns for routine follow-up of his CLL, anemia, and prior DVT.  He was last seen by Tarri Abernethy PA-C on 01/17/2022.   At today's visit, he reports feeling fairly well.  No recent hospitalizations, surgeries, or changes in baseline health status.  He denies any new lumps or bumps.   He has not had any fever, chills, or night sweats.  He has lost about 10 pounds since his last visit - weight today is 156 pounds, was 164 at visit 4 months ago.  He has been eating less with some early satiety and also reports left lower quadrant abdominal discomfort.  He has not had any extreme fatigue or frequent infections.  He denies symptoms of hyperviscosity such as headache, dizziness, tinnitus, or vision changes.  He denies any signs or symptoms of blood loss.   No chest pain, shortness of breath, syncope, or palpitations.     He continues to have chronic peripheral edema, left greater than right due to chronic LLE DVT, without any acute changes.  No symptoms of pulmonary embolism such as dyspnea, chest pain, or hemoptysis.      He has 80% energy and 80% appetite. He endorses that he is maintaining a stable weight.   REVIEW OF SYSTEMS:    Review of Systems  Constitutional:  Positive for unexpected weight change. Negative for appetite change, chills, diaphoresis, fatigue and fever.  HENT:   Negative for lump/mass and nosebleeds.   Eyes:  Negative for eye problems.  Respiratory:  Positive for cough (increased phlegm production, "clearing throat" often). Negative for hemoptysis and  shortness of breath.   Cardiovascular:  Positive for leg swelling (L > R, chronic). Negative for chest pain and palpitations.  Gastrointestinal:  Negative for abdominal pain, blood in stool, constipation, diarrhea, nausea and vomiting.  Genitourinary:  Negative for hematuria.   Skin: Negative.   Neurological:  Negative for dizziness, headaches and light-headedness.  Hematological:  Does not bruise/bleed easily.  Psychiatric/Behavioral:  Positive for depression and sleep disturbance.      PAST MEDICAL/SURGICAL HISTORY:  Past Medical History:  Diagnosis Date   Acute MI, inferolateral wall, initial episode of care ([facility]) 06/16/2015   99% dCx    BPH (benign prostatic hyperplasia) 04/08/2011   CAD S/P percutaneous coronary angioplasty 11/23/12; 06/16/15   a. NSTEMI 2014 with 2 overlapping mRCA lesions - Xience Xpedition DES 2.75 mm x 18 mm (3.0 mm). b. Inf-Lat STEMI 06/16/2015 - dCx Asp Thrombectomy & PCI 3.0 x 15 Xience drDES (3.6 mm).   Chronic combined systolic and diastolic CHF (congestive heart failure) (HCC)    Chronic headache    CKD (chronic kidney disease), stage III (HCC)    CLL (chronic lymphocytic leukemia) ([facility]) 04/08/2011   Dementia ([facility])    Depression    Diverticulitis    DVT (deep venous thrombosis) ([facility]) 2021   Left, no anticoagulation due to history of diverticular bleeding, IVC filter placed.   Gallstone    GERD (gastroesophageal reflux disease)    Hypercholesteremia    Ischemic cardiomyopathy    Echo  06/18/2015 EF 35-40% after lateral MI -> repeat echo 08/06/2015: EF 40-45% with basal-mid inferolateral akinesis and hypokinesis of the lateral wall.   NSTEMI (non-ST elevated myocardial infarction) ([facility]) 11/23/2012   NSTEMI 11/23/2012 RCA lesion - PCI with DES; moderate LAD disease   PTSD (post-traumatic stress disorder)    With Depression - since MI   Past Surgical History:  Procedure Laterality Date   APPENDECTOMY  1968-70   CARDIAC CATHETERIZATION N/A 06/16/2015    Procedure: Left Heart Cath and Coronary Angiography;  Surgeon: Jettie Booze, MD;  Location: [facility];  Service: Cardiovascular: 99% thrombotic dCx -> asp thrombectomy & PCI. RCA Stents Patent.    CARDIAC CATHETERIZATION N/A 06/16/2015   Procedure: Coronary Stent Intervention;  Surgeon: Jettie Booze, MD;  Location: [facility];  Service: Cardiovascular: PCI dCx = thrombectomy -> 3.0 x 15 Xience drug-eluting stent (3.6 mm)    COLONOSCOPY  2008   HYQ:MVHQ-IONGE diverticula, diminutive polyp in the cecum, s/p bx Normal rectum. Tubular adenoma   COLONOSCOPY N/A 10/17/2012   Dr. Gala Romney: colonic diverticulosis, tubular adenoma, surveillance due May 2019   COLONOSCOPY N/A 02/21/2018   Procedure: COLONOSCOPY;  Surgeon: Daneil Dolin, MD; Diverticulosis of the sigmoid and descending colon, otherwise normal exam.  No recommendations to repeat.   CORONARY STENT PLACEMENT  11/23/2012   Mid RCA -- Xience eXp - 2.75 mm x 18 mm DES (3.50m) , Dr. HEllyn Hack  ESOPHAGOGASTRODUODENOSCOPY N/A 03/03/2021   Procedure: ESOPHAGOGASTRODUODENOSCOPY (EGD);  Surgeon: RDaneil Dolin MD;  [facility];  Service: Endoscopy;  Laterality: N/A;  12:00pm   IR IVC FILTER PLMT / S&I /IMG GUID/MOD SED  05/27/2019   IR RADIOLOGIST EVAL & MGMT  09/11/2019   LEFT HEART CATHETERIZATION WITH CORONARY ANGIOGRAM N/A 11/23/2012   Procedure: LEFT HEART CATHETERIZATION WITH CORONARY ANGIOGRAM;  Surgeon: DLeonie Man MD;  [facility];  Service: Cardiovascular: NSTEMI: Culprit = mid RCA 95% & 75%; LAD ~40-50%, Small RI ~60%; EF ~60%, mild inf-basal HK      MALONEY DILATION N/A 03/03/2021   Procedure: MALONEY DILATION;  Surgeon: RDaneil Dolin MD;  [facility];  Service: Endoscopy;  Laterality: N/A;   TRANSTHORACIC ECHOCARDIOGRAM  January 2017; March 2017   a. EF 35-40%. Entire inferior hypokinesis and akinesis of the basal and mid inferolateral and anterolateral /distal lateral  walls;; b. EF 40 and 45%. Akinesis of basal-mid inferolateral wall. Hypokinesis of entire lateral wall.     SOCIAL HISTORY:  Social History   Socioeconomic History   Marital status: Married    Spouse name: Not on file   Number of children: Not on file   Years of education: Not on file   Highest education level: Not on file  Occupational History   Not on file  Tobacco Use   Smoking status: Former    Packs/day: 0.25    Years: 2.00    Total pack years: 0.50    Types: Cigarettes    Quit date: 06/07/1963    Years since quitting: 58.9   Smokeless tobacco: Never  Vaping Use   Vaping Use: Never used  Substance and Sexual Activity   Alcohol use: No    Alcohol/week: 0.0 standard drinks of alcohol    Comment: Occasionally drinks red wine   Drug use: No   Sexual activity: Not on file  Other Topics Concern   Not on file  Social History Narrative   Exercises 3 to 4  days a week at the fitness center for about a half an hour at a time.   "former smoker". Does not drink EtOH   Married.   Social Determinants of Health   Financial Resource Strain: Not on file  Food Insecurity: Not on file  Transportation Needs: Not on file  Physical Activity: Not on file  Stress: Not on file  Social Connections: Not on file  Intimate Partner Violence: Not on file    FAMILY HISTORY:  Family History  Problem Relation Age of Onset   Diabetes Mother    Cancer Father        prostate   Colon cancer Neg Hx    Esophageal cancer Neg Hx    Stomach cancer Neg Hx     CURRENT MEDICATIONS:  Outpatient Encounter Medications as of 05/19/2022  Medication Sig Note   clopidogrel (PLAVIX) 75 MG tablet TAKE 1 TABLET BY MOUTH EVERY DAY    [employer] VITAMIN B12 1000 MCG TBCR TAKE 1 TABLET BY MOUTH EVERY DAY    furosemide (LASIX) 20 MG tablet TAKE 1 TABLET BY MOUTH EVERY DAY    lisinopril (ZESTRIL) 2.5 MG tablet TAKE 1 TABLET BY MOUTH EVERY DAY    memantine (NAMENDA) 5 MG tablet Take 5 mg by mouth 2 (two) times  daily.    nitroGLYCERIN (NITROSTAT) 0.4 MG SL tablet Place 0.4 mg under the tongue every 5 (five) minutes as needed for chest pain. 02/25/2021: Newly prescribed, hasnt picked up yet   ondansetron (ZOFRAN-ODT) 4 MG disintegrating tablet Take 1 tablet (4 mg total) by mouth every 8 (eight) hours as needed for nausea or vomiting.    pantoprazole (PROTONIX) 40 MG tablet TAKE 1 TABLET BY MOUTH EVERY DAY    atorvastatin (LIPITOR) 40 MG tablet Take 1 tablet (40 mg total) by mouth daily.    No facility-administered encounter medications on file as of 05/19/2022.    ALLERGIES:  No Known Allergies   PHYSICAL EXAM:    ECOG PERFORMANCE STATUS: 1 - Symptomatic but completely ambulatory  Vitals:   05/19/22 1012  BP: 120/79  Pulse: 72  Resp: 16  Temp: (!) 97.4 F (36.3 C)  SpO2: 97%   Filed Weights   05/19/22 1012  Weight: 156 lb 8.4 oz (71 kg)   Physical Exam Constitutional:      Appearance: Normal appearance. He is obese.  HENT:     Head: Normocephalic and atraumatic.     Mouth/Throat:     Mouth: Mucous membranes are moist.  Eyes:     Extraocular Movements: Extraocular movements intact.     Pupils: Pupils are equal, round, and reactive to light.  Cardiovascular:     Rate and Rhythm: Normal rate. Rhythm regularly irregular.     Pulses: Normal pulses.     Heart sounds: Normal heart sounds.  Pulmonary:     Effort: Pulmonary effort is normal.     Breath sounds: Normal breath sounds.  Abdominal:     General: Bowel sounds are normal.     Palpations: Abdomen is soft. There is no hepatomegaly or splenomegaly.     Tenderness: There is no abdominal tenderness.  Musculoskeletal:        General: No swelling.     Right lower leg: Edema (1+) present.     Left lower leg: Edema (2+) present.  Lymphadenopathy:     Head:     Right side of head: No submental, submandibular, tonsillar, preauricular, posterior auricular or occipital adenopathy.     Left  side of head: No submental, submandibular,  tonsillar, preauricular or posterior auricular adenopathy.     Cervical: No cervical adenopathy.     Right cervical: No superficial, deep or posterior cervical adenopathy.    Left cervical: No superficial, deep or posterior cervical adenopathy.     Upper Body:     Right upper body: No supraclavicular, axillary, pectoral or epitrochlear adenopathy.     Left upper body: No supraclavicular, axillary, pectoral or epitrochlear adenopathy.     Lower Body: No right inguinal adenopathy. No left inguinal adenopathy.  Skin:    General: Skin is warm and dry.  Neurological:     General: No focal deficit present.     Mental Status: He is alert and oriented to person, place, and time.  Psychiatric:        Mood and Affect: Mood normal.        Behavior: Behavior normal.     LABORATORY DATA:  I have reviewed the labs as listed.  CBC    Component Value Date/Time   WBC 19.9 (H) 05/12/2022 1110   RBC 4.79 05/12/2022 1110   HGB 14.6 05/12/2022 1110   HCT 45.6 05/12/2022 1110   PLT 159 05/12/2022 1110   MCV 95.2 05/12/2022 1110   MCH 30.5 05/12/2022 1110   MCHC 32.0 05/12/2022 1110   RDW 15.0 05/12/2022 1110   LYMPHSABS 15.4 (H) 05/12/2022 1110   MONOABS 0.3 05/12/2022 1110   EOSABS 0.1 05/12/2022 1110   BASOSABS 0.1 05/12/2022 1110      Latest Ref Rng & Units 05/12/2022   11:10 AM 01/10/2022   10:19 AM 09/06/2021   10:53 AM  CMP  Glucose 70 - 99 mg/dL 133  117  117   BUN 8 - 23 mg/dL '14  11  17   '$ Creatinine 0.61 - 1.24 mg/dL 1.45  1.80  1.91   Sodium 135 - 145 mmol/L 136  137  139   Potassium 3.5 - 5.1 mmol/L 4.0  4.1  4.5   Chloride 98 - 111 mmol/L 108  107  107   CO2 22 - 32 mmol/L '22  24  25   '$ Calcium 8.9 - 10.3 mg/dL 8.8  9.0  9.1   Total Protein 6.5 - 8.1 g/dL 6.1  6.6  6.6   Total Bilirubin 0.3 - 1.2 mg/dL 0.9  1.3  1.3   Alkaline Phos 38 - 126 U/L 77  74  57   AST 15 - 41 U/L '20  22  17   '$ ALT 0 - 44 U/L '13  12  11     '$ DIAGNOSTIC IMAGING:  I have independently reviewed the  relevant imaging and discussed with the patient.  ASSESSMENT & PLAN: 1.  Chronic lymphocytic leukemia (Rai stage 0, Binet stage A) - Originally diagnosed in 2008, has been on watchful waiting since then with expected waxing and waning of his CLL - No fevers, night sweats, or extreme fatigue.  No recurrent infections.     - No splenomegaly noted on CT abdomen/pelvis on 09/07/2017 - No lymphadenopathy or hepatosplenomegaly appreciated on exam    - He has lost about 10 pounds since his last visit - weight today is 156 pounds, was 164 at visit 4 months ago.  He has been eating less with some early satiety and also reports left lower quadrant abdominal discomfort. - Most recent labs (05/12/2022): WBC 19.9/ALC 15.4, mildly increased from previous.  Hgb 14.6 with platelets 159.  LDH normal at 117. -  PLAN: Repeat labs and RTC in 3 months.  No indication for treatment at this time. - Recommend annual MD evaluation.  (Last seen by Dr. Delton Coombes in November 2021.)   2.  History of provoked DVT  - DVT of left lower extremity during hospitalization for COVID-19 infection in December 2020 - Anticoagulation was not started due to diverticular GI bleed at that time - IVC filter placed on 05/27/2019 - per note by interventional radiologist (Dr. Jacqulynn Cadet) on 09/11/2019, IVC filter is to be considered a permanent device - Most recent venous duplex ultrasound (09/11/2019): Chronic thrombus of left common femoral, femoral, profunda femoral, and popliteal veins. - Not on anticoagulation at this time (although he does take Plavix for CAD with stents)  - PLAN: Stable at this time with IVC filter in place    3.  Normocytic anemia, resolved - Intermittent anemia from iron deficiency and mild CKD - Received Venofer x5 doses from 07/23/2019 through 08/05/2019 - Most recent labs show normal hemoglobin - PLAN: Stable at this time   4.  CKD stage IIIa/b - Most recent creatinine 1.45 with GFR 49 - PLAN: Continue  follow-up with Dr. Theador Hawthorne    PLAN SUMMARY: >> CT abdomen/pelvis due to weight loss, early satiety, LLQ pain in the setting of CLL >> Labs in 3 months (CBC/D, CMP, LDH)  >> MD VISIT with DR. KATRAGADDA in 3 months (recommend annual MD visits, can be seen by Kajal Scalici again after seeing Dr. Raliegh Ip)   All questions were answered. The patient knows to call the clinic with any problems, questions or concerns.  Medical decision making: Moderate    Time spent on visit: I spent 20 minutes counseling the patient face to face. The total time spent in the appointment was 30 minutes and more than 50% was on counseling.   Harriett Rush, PA-C  05/19/2022 10:51 AM

## 2022-05-19 NOTE — Patient Instructions (Addendum)
[place] at [facility] Discharge Instructions  You were seen today by Tarri Abernethy PA-C for your CLL (chronic lymphocytic leukemia).  This is a type of slow-growing leukemia (blood cancer).  Right now, you are not having any symptoms of your leukemia and your blood counts are stable.  You do not require any treatment at this time, but we will continue to monitor you periodically to check your blood counts, symptoms, and physical exam.  We will check a CT scan of your abdomen due to your weight loss and abdominal pain.  Your anemia is also stable.  You do not have any concerning low blood counts at this time.  LABS: Return in 3 months for repeat labs  OTHER TESTS: None  MEDICATIONS: No changes to home medication  FOLLOW-UP APPOINTMENT: Office visit in 3 months with Dr. Delton Coombes  - If you need to be seen sooner than 3 months, please call our office.  Be on the look out for "alarm symptoms" such as unexplained fever, chills, night sweats, unintentional weight loss.  If any of these occur, please call our office to schedule an appointment.  ** Thank you for trusting me with your healthcare!  I strive to provide all of my patients with quality care at each visit.  If you receive a survey for this visit, I would be so grateful to you for taking the time to provide feedback.  Thank you in advance!  ~ Luwana Butrick                   Dr. Derek Jack   &   Tarri Abernethy, PA-C   - - - - - - - - - - - - - - - - - -     Thank you for choosing [place] at [facility] to provide your oncology and hematology care.  To afford each patient quality time with our provider, please arrive at least 15 minutes before your scheduled appointment time.   If you have a lab appointment with the [facility] please come in thru the Main Entrance and check in at the main information desk.  You need to re-schedule your appointment should you arrive 10  or more minutes late.  We strive to give you quality time with our providers, and arriving late affects you and other patients whose appointments are after yours.  Also, if you no show three or more times for appointments you may be dismissed from the clinic at the providers discretion.     Again, thank you for choosing [facility].  Our hope is that these requests will decrease the amount of time that you wait before being seen by our physicians.       _____________________________________________________________  Should you have questions after your visit to [facility], please contact our office at [phone] and follow the prompts.  Our office hours are 8:00 a.m. and 4:30 p.m. Monday - Friday.  Please note that voicemails left after 4:00 p.m. may not be returned until the following business day.  We are closed weekends and major holidays.  You do have access to a nurse 24-7, just call the main number to the clinic [phone] and do not press any options, hold on the line and a nurse will answer the phone.    For prescription refill requests, have your pharmacy contact our office and allow 72 hours.    Due to Covid, you  will need to wear a mask upon entering the hospital. If you do not have a mask, a mask will be given to you at the Main Entrance upon arrival. For doctor visits, patients may have 1 support person age 68 or older with them. For treatment visits, patients can not have anyone with them due to social distancing guidelines and our immunocompromised population.

## 2022-05-21 DIAGNOSIS — U071 COVID-19: Secondary | ICD-10-CM | POA: Diagnosis not present

## 2022-05-21 DIAGNOSIS — I129 Hypertensive chronic kidney disease with stage 1 through stage 4 chronic kidney disease, or unspecified chronic kidney disease: Secondary | ICD-10-CM | POA: Diagnosis not present

## 2022-05-21 DIAGNOSIS — I5042 Chronic combined systolic (congestive) and diastolic (congestive) heart failure: Secondary | ICD-10-CM | POA: Diagnosis not present

## 2022-05-21 DIAGNOSIS — C9190 Lymphoid leukemia, unspecified not having achieved remission: Secondary | ICD-10-CM | POA: Diagnosis not present

## 2022-05-21 DIAGNOSIS — N1831 Chronic kidney disease, stage 3a: Secondary | ICD-10-CM | POA: Diagnosis not present

## 2022-05-21 DIAGNOSIS — E211 Secondary hyperparathyroidism, not elsewhere classified: Secondary | ICD-10-CM | POA: Diagnosis not present

## 2022-05-21 DIAGNOSIS — C9110 Chronic lymphocytic leukemia of B-cell type not having achieved remission: Secondary | ICD-10-CM | POA: Diagnosis not present

## 2022-06-16 DIAGNOSIS — R7301 Impaired fasting glucose: Secondary | ICD-10-CM | POA: Diagnosis not present

## 2022-06-16 DIAGNOSIS — I10 Essential (primary) hypertension: Secondary | ICD-10-CM | POA: Diagnosis not present

## 2022-06-21 DIAGNOSIS — C9110 Chronic lymphocytic leukemia of B-cell type not having achieved remission: Secondary | ICD-10-CM | POA: Diagnosis not present

## 2022-06-21 DIAGNOSIS — U071 COVID-19: Secondary | ICD-10-CM | POA: Diagnosis not present

## 2022-06-22 DIAGNOSIS — C9110 Chronic lymphocytic leukemia of B-cell type not having achieved remission: Secondary | ICD-10-CM | POA: Diagnosis not present

## 2022-06-22 DIAGNOSIS — I509 Heart failure, unspecified: Secondary | ICD-10-CM | POA: Diagnosis not present

## 2022-06-22 DIAGNOSIS — R1310 Dysphagia, unspecified: Secondary | ICD-10-CM | POA: Diagnosis not present

## 2022-06-22 DIAGNOSIS — E8809 Other disorders of plasma-protein metabolism, not elsewhere classified: Secondary | ICD-10-CM | POA: Diagnosis not present

## 2022-06-22 DIAGNOSIS — N1832 Chronic kidney disease, stage 3b: Secondary | ICD-10-CM | POA: Diagnosis not present

## 2022-06-22 DIAGNOSIS — F0390 Unspecified dementia without behavioral disturbance: Secondary | ICD-10-CM | POA: Diagnosis not present

## 2022-06-22 DIAGNOSIS — D631 Anemia in chronic kidney disease: Secondary | ICD-10-CM | POA: Diagnosis not present

## 2022-06-22 DIAGNOSIS — I82409 Acute embolism and thrombosis of unspecified deep veins of unspecified lower extremity: Secondary | ICD-10-CM | POA: Diagnosis not present

## 2022-06-22 DIAGNOSIS — D638 Anemia in other chronic diseases classified elsewhere: Secondary | ICD-10-CM | POA: Diagnosis not present

## 2022-06-22 DIAGNOSIS — E785 Hyperlipidemia, unspecified: Secondary | ICD-10-CM | POA: Diagnosis not present

## 2022-06-22 DIAGNOSIS — I2510 Atherosclerotic heart disease of native coronary artery without angina pectoris: Secondary | ICD-10-CM | POA: Diagnosis not present

## 2022-06-22 DIAGNOSIS — I130 Hypertensive heart and chronic kidney disease with heart failure and stage 1 through stage 4 chronic kidney disease, or unspecified chronic kidney disease: Secondary | ICD-10-CM | POA: Diagnosis not present

## 2022-07-22 DIAGNOSIS — U071 COVID-19: Secondary | ICD-10-CM | POA: Diagnosis not present

## 2022-07-22 DIAGNOSIS — C9110 Chronic lymphocytic leukemia of B-cell type not having achieved remission: Secondary | ICD-10-CM | POA: Diagnosis not present

## 2022-07-26 ENCOUNTER — Encounter: Payer: Self-pay | Admitting: Internal Medicine

## 2022-07-26 DIAGNOSIS — I739 Peripheral vascular disease, unspecified: Secondary | ICD-10-CM | POA: Diagnosis not present

## 2022-07-26 DIAGNOSIS — B351 Tinea unguium: Secondary | ICD-10-CM | POA: Diagnosis not present

## 2022-08-15 ENCOUNTER — Inpatient Hospital Stay: Payer: Medicare HMO | Attending: Hematology

## 2022-08-15 ENCOUNTER — Ambulatory Visit (HOSPITAL_COMMUNITY)
Admission: RE | Admit: 2022-08-15 | Discharge: 2022-08-15 | Disposition: A | Discharge status: Home / Self Care | Payer: Medicare HMO | Source: Ambulatory Visit | Attending: Physician Assistant | Admitting: Physician Assistant

## 2022-08-15 ENCOUNTER — Other Ambulatory Visit (HOSPITAL_COMMUNITY)
Admission: RE | Admit: 2022-08-15 | Discharge: 2022-08-15 | Disposition: A | Discharge status: Home / Self Care | Payer: Medicare HMO | Source: Ambulatory Visit | Attending: Nephrology | Admitting: Nephrology

## 2022-08-15 DIAGNOSIS — Z87891 Personal history of nicotine dependence: Secondary | ICD-10-CM | POA: Insufficient documentation

## 2022-08-15 DIAGNOSIS — K8020 Calculus of gallbladder without cholecystitis without obstruction: Secondary | ICD-10-CM | POA: Diagnosis not present

## 2022-08-15 DIAGNOSIS — D631 Anemia in chronic kidney disease: Secondary | ICD-10-CM | POA: Insufficient documentation

## 2022-08-15 DIAGNOSIS — Z79899 Other long term (current) drug therapy: Secondary | ICD-10-CM | POA: Insufficient documentation

## 2022-08-15 DIAGNOSIS — C9110 Chronic lymphocytic leukemia of B-cell type not having achieved remission: Secondary | ICD-10-CM | POA: Diagnosis present

## 2022-08-15 DIAGNOSIS — R1032 Left lower quadrant pain: Secondary | ICD-10-CM | POA: Insufficient documentation

## 2022-08-15 DIAGNOSIS — Z86718 Personal history of other venous thrombosis and embolism: Secondary | ICD-10-CM | POA: Insufficient documentation

## 2022-08-15 DIAGNOSIS — D500 Iron deficiency anemia secondary to blood loss (chronic): Secondary | ICD-10-CM

## 2022-08-15 DIAGNOSIS — R109 Unspecified abdominal pain: Secondary | ICD-10-CM | POA: Diagnosis not present

## 2022-08-15 DIAGNOSIS — N183 Chronic kidney disease, stage 3 unspecified: Secondary | ICD-10-CM | POA: Insufficient documentation

## 2022-08-15 DIAGNOSIS — Z7902 Long term (current) use of antithrombotics/antiplatelets: Secondary | ICD-10-CM | POA: Insufficient documentation

## 2022-08-15 DIAGNOSIS — D649 Anemia, unspecified: Secondary | ICD-10-CM

## 2022-08-15 LAB — COMPREHENSIVE METABOLIC PANEL
ALT: 19 U/L (ref 0–44)
AST: 24 U/L (ref 15–41)
Albumin: 3.7 g/dL (ref 3.5–5.0)
Alkaline Phosphatase: 82 U/L (ref 38–126)
Anion gap: 7 (ref 5–15)
BUN: 11 mg/dL (ref 8–23)
CO2: 27 mmol/L (ref 22–32)
Calcium: 9 mg/dL (ref 8.9–10.3)
Chloride: 108 mmol/L (ref 98–111)
Creatinine, Ser: 1.4 mg/dL — ABNORMAL HIGH (ref 0.61–1.24)
GFR, Estimated: 51 mL/min — ABNORMAL LOW (ref >60)
Glucose, Bld: 86 mg/dL (ref 70–99)
Potassium: 3.9 mmol/L (ref 3.5–5.1)
Sodium: 142 mmol/L (ref 135–145)
Total Bilirubin: 1.1 mg/dL (ref 0.3–1.2)
Total Protein: 6.3 g/dL — ABNORMAL LOW (ref 6.5–8.1)

## 2022-08-15 LAB — RENAL FUNCTION PANEL
Albumin: 3.5 g/dL (ref 3.5–5.0)
Anion gap: 6 (ref 5–15)
BUN: 12 mg/dL (ref 8–23)
CO2: 26 mmol/L (ref 22–32)
Calcium: 8.9 mg/dL (ref 8.9–10.3)
Chloride: 109 mmol/L (ref 98–111)
Creatinine, Ser: 1.43 mg/dL — ABNORMAL HIGH (ref 0.61–1.24)
GFR, Estimated: 50 mL/min — ABNORMAL LOW (ref >60)
Glucose, Bld: 87 mg/dL (ref 70–99)
Phosphorus: 3 mg/dL (ref 2.5–4.6)
Potassium: 4.1 mmol/L (ref 3.5–5.1)
Sodium: 141 mmol/L (ref 135–145)

## 2022-08-15 LAB — CBC WITH DIFFERENTIAL/PLATELET
Abs Immature Granulocytes: 0.05 10*3/uL (ref 0.00–0.07)
Basophils Absolute: 0.1 10*3/uL (ref 0.0–0.1)
Basophils Relative: 0 %
Eosinophils Absolute: 0.2 10*3/uL (ref 0.0–0.5)
Eosinophils Relative: 1 %
HCT: 48.5 % (ref 39.0–52.0)
Hemoglobin: 14.8 g/dL (ref 13.0–17.0)
Immature Granulocytes: 0 %
Lymphocytes Relative: 75 %
Lymphs Abs: 15.8 10*3/uL — ABNORMAL HIGH (ref 0.7–4.0)
MCH: 30 pg (ref 26.0–34.0)
MCHC: 30.5 g/dL (ref 30.0–36.0)
MCV: 98.2 fL (ref 80.0–100.0)
Monocytes Absolute: 0.5 10*3/uL (ref 0.1–1.0)
Monocytes Relative: 2 %
Neutro Abs: 4.8 10*3/uL (ref 1.7–7.7)
Neutrophils Relative %: 22 %
Platelets: 176 10*3/uL (ref 150–400)
RBC: 4.94 MIL/uL (ref 4.22–5.81)
RDW: 14.1 % (ref 11.5–15.5)
WBC: 21.3 10*3/uL — ABNORMAL HIGH (ref 4.0–10.5)
nRBC: 0 % (ref 0.0–0.2)

## 2022-08-15 LAB — PROTEIN / CREATININE RATIO, URINE
Creatinine, Urine: 243 mg/dL
Protein Creatinine Ratio: 0.08 mg/mg{Cre} (ref 0.00–0.15)
Total Protein, Urine: 20 mg/dL

## 2022-08-15 LAB — LACTATE DEHYDROGENASE: LDH: 126 U/L (ref 98–192)

## 2022-08-20 DIAGNOSIS — U071 COVID-19: Secondary | ICD-10-CM | POA: Diagnosis not present

## 2022-08-20 DIAGNOSIS — C9110 Chronic lymphocytic leukemia of B-cell type not having achieved remission: Secondary | ICD-10-CM | POA: Diagnosis not present

## 2022-08-21 NOTE — Progress Notes (Signed)
[facility] [address] [place] [address]    Clinic Day:  08/22/2022  Referring physician: Celene Squibb, MD  Patient Care Team: Celene Squibb, MD as PCP - General (Internal Medicine) Leonie Man, MD as PCP - Cardiology (Cardiology) Gala Romney Cristopher Estimable, MD as Attending Physician (Gastroenterology) Derek Jack, MD as Consulting Physician (Hematology and Oncology)   ASSESSMENT & PLAN:   Assessment: 1.  Stage I CLL: -Originally diagnosed in 2008, on watchful waiting since then. -No fevers, night sweats or weight loss.  No recurrent infections.   2.  DVT: -Diagnosed with DVT during hospitalization in December 2020.  IVC filter on 05/27/2019. -Anticoagulation was not started as he had GI bleed secondary to diverticulosis.   3.  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 -Venofer 5 doses from 07/23/2019 through 08/05/2019.  Plan: 1.  Stage I CLL: - Denies any fevers, night sweats or weight loss in the last 4 months. - He gained 5 pounds.  No infections. - Reviewed labs from 08/15/2022.  White count is 21.3, slightly increased.  Hemoglobin and platelets are normal.  LDH normal.   - we have reviewed CTAP from 08/15/2022 which was done for weight loss.  However patient regained weight from last visit. - CT scan did not show any significant adenopathy or splenomegaly. - No indication for treatment at this time.  RTC 6 months for follow-up.     Orders Placed This Encounter  Procedures   CBC with Differential    Standing Status:   Future    Standing Expiration Date:   08/22/2023   Comprehensive metabolic panel    Standing Status:   Future    Standing Expiration Date:   08/22/2023   Lactate dehydrogenase    Standing Status:   Future    Standing Expiration Date:   08/22/2023   Iron and TIBC ([facility] ONLY)    Standing Status:   Future    Standing Expiration Date:   08/22/2023   Ferritin    Standing Status:    Future    Standing Expiration Date:   08/22/2023      I,Alexis Herring,acting as a scribe for Derek Jack, MD.,have documented all relevant documentation on the behalf of Derek Jack, MD,as directed by  Derek Jack, MD while in the presence of Derek Jack, MD.   I, Derek Jack MD, have reviewed the above documentation for accuracy and completeness, and I agree with the above.   Derek Jack, MD   3/18/20246:24 PM  CHIEF COMPLAINT:   Diagnosis: CLL and anemia   Prior Therapy: none  Current Therapy:  Observation and intermittent Venofer  INTERVAL HISTORY:   [name] is a 77 y.o. male presenting to clinic today for follow up of CLL and anemia. He was last seen by me on 04/21/2020. He was most recently seen by PA Pennington on 05/19/2022.  Today, he states that he is doing well overall. His appetite level is at 75%. His energy level is at 50%. He denies any infections in the last 4 months   PAST MEDICAL HISTORY:   Past Medical History: Past Medical History:  Diagnosis Date   Acute MI, inferolateral wall, initial episode of care ([place]) 06/16/2015   99% dCx    BPH (benign prostatic hyperplasia) 04/08/2011   CAD S/P percutaneous coronary angioplasty 11/23/12; 06/16/15   a. NSTEMI 2014 with 2 overlapping mRCA lesions - Xience Xpedition DES 2.75 mm x 18 mm (3.0 mm).  b. Inf-Lat STEMI 06/16/2015 - dCx Asp Thrombectomy & PCI 3.0 x 15 Xience drDES (3.6 mm).   Chronic combined systolic and diastolic CHF (congestive heart failure) (HCC)    Chronic headache    CKD (chronic kidney disease), stage III (HCC)    CLL (chronic lymphocytic leukemia) ([facility]) 04/08/2011   Dementia ([place])    Depression    Diverticulitis    DVT (deep venous thrombosis) ([place]) 2021   Left, no anticoagulation due to history of diverticular bleeding, IVC filter placed.   Gallstone    GERD (gastroesophageal reflux disease)    Hypercholesteremia    Ischemic cardiomyopathy     Echo 06/18/2015 EF 35-40% after lateral MI -> repeat echo 08/06/2015: EF 40-45% with basal-mid inferolateral akinesis and hypokinesis of the lateral wall.   NSTEMI (non-ST elevated myocardial infarction) ([facility]) 11/23/2012   NSTEMI 11/23/2012 RCA lesion - PCI with DES; moderate LAD disease   PTSD (post-traumatic stress disorder)    With Depression - since MI    Surgical History: Past Surgical History:  Procedure Laterality Date   APPENDECTOMY  1968-70   CARDIAC CATHETERIZATION N/A 06/16/2015   Procedure: Left Heart Cath and Coronary Angiography;  Surgeon: Jettie Booze, MD;  Location: [facility];  Service: Cardiovascular: 99% thrombotic dCx -> asp thrombectomy & PCI. RCA Stents Patent.    CARDIAC CATHETERIZATION N/A 06/16/2015   Procedure: Coronary Stent Intervention;  Surgeon: Jettie Booze, MD;  Location: [facility];  Service: Cardiovascular: PCI dCx = thrombectomy -> 3.0 x 15 Xience drug-eluting stent (3.6 mm)    COLONOSCOPY  2008   [id number] diverticula, diminutive polyp in the cecum, s/p bx Normal rectum. Tubular adenoma   COLONOSCOPY N/A 10/17/2012   Dr. Gala Romney: colonic diverticulosis, tubular adenoma, surveillance due May 2019   COLONOSCOPY N/A 02/21/2018   Procedure: COLONOSCOPY;  Surgeon: Daneil Dolin, MD; Diverticulosis of the sigmoid and descending colon, otherwise normal exam.  No recommendations to repeat.   CORONARY STENT PLACEMENT  11/23/2012   Mid RCA -- Xience eXp - 2.75 mm x 18 mm DES (3.76mm) , Dr. Ellyn Hack   ESOPHAGOGASTRODUODENOSCOPY N/A 03/03/2021   Procedure: ESOPHAGOGASTRODUODENOSCOPY (EGD);  Surgeon: Daneil Dolin, MD;  [facility];  Service: Endoscopy;  Laterality: N/A;  12:00pm   IR IVC FILTER PLMT / S&I /IMG GUID/MOD SED  05/27/2019   IR RADIOLOGIST EVAL & MGMT  09/11/2019   LEFT HEART CATHETERIZATION WITH CORONARY ANGIOGRAM N/A 11/23/2012   Procedure: LEFT HEART CATHETERIZATION WITH CORONARY ANGIOGRAM;  Surgeon: Leonie Man, MD;  Location: [facility];  Service: Cardiovascular: NSTEMI: Culprit = mid RCA 95% & 75%; LAD ~40-50%, Small RI ~60%; EF ~60%, mild inf-basal HK      MALONEY DILATION N/A 03/03/2021   Procedure: MALONEY DILATION;  Surgeon: Daneil Dolin, MD;  [facility];  Service: Endoscopy;  Laterality: N/A;   TRANSTHORACIC ECHOCARDIOGRAM  January 2017; March 2017   a. EF 35-40%. Entire inferior hypokinesis and akinesis of the basal and mid inferolateral and anterolateral /distal lateral walls;; b. EF 40 and 45%. Akinesis of basal-mid inferolateral wall. Hypokinesis of entire lateral wall.    Social History: Social History   Socioeconomic History   Marital status: Married    Spouse name: Not on file   Number of children: Not on file   Years of education: Not on file   Highest education level: Not on file  Occupational History   Not on file  Tobacco Use  Smoking status: Former    Packs/day: 0.25    Years: 2.00    Additional pack years: 0.00    Total pack years: 0.50    Types: Cigarettes    Quit date: 06/07/1963    Years since quitting: 59.2   Smokeless tobacco: Never  Vaping Use   Vaping Use: Never used  Substance and Sexual Activity   Alcohol use: No    Alcohol/week: 0.0 standard drinks of alcohol    Comment: Occasionally drinks red wine   Drug use: No   Sexual activity: Not on file  Other Topics Concern   Not on file  Social History Narrative   Exercises 3 to 4 days a week at the fitness center for about a half an hour at a time.   "former smoker". Does not drink EtOH   Married.   Social Determinants of Health   Financial Resource Strain: Not on file  Food Insecurity: Not on file  Transportation Needs: Not on file  Physical Activity: Not on file  Stress: Not on file  Social Connections: Not on file  Intimate Partner Violence: Not on file    Family History: Family History  Problem Relation Age of Onset   Diabetes Mother    Cancer Father         prostate   Colon cancer Neg Hx    Esophageal cancer Neg Hx    Stomach cancer Neg Hx     Current Medications:  Current Outpatient Medications:    clopidogrel (PLAVIX) 75 MG tablet, TAKE 1 TABLET BY MOUTH EVERY DAY, Disp: 90 tablet, Rfl: 1   [employer] VITAMIN B12 1000 MCG TBCR, TAKE 1 TABLET BY MOUTH EVERY DAY, Disp: 60 tablet, Rfl: 3   furosemide (LASIX) 20 MG tablet, TAKE 1 TABLET BY MOUTH EVERY DAY, Disp: 90 tablet, Rfl: 1   lisinopril (ZESTRIL) 2.5 MG tablet, TAKE 1 TABLET BY MOUTH EVERY DAY, Disp: 90 tablet, Rfl: 3   memantine (NAMENDA) 5 MG tablet, Take 5 mg by mouth 2 (two) times daily., Disp: , Rfl:    nitroGLYCERIN (NITROSTAT) 0.4 MG SL tablet, Place 0.4 mg under the tongue every 5 (five) minutes as needed for chest pain., Disp: , Rfl:    ondansetron (ZOFRAN-ODT) 4 MG disintegrating tablet, Take 1 tablet (4 mg total) by mouth every 8 (eight) hours as needed for nausea or vomiting., Disp: 15 tablet, Rfl: 0   pantoprazole (PROTONIX) 40 MG tablet, TAKE 1 TABLET BY MOUTH EVERY DAY, Disp: 90 tablet, Rfl: 2   atorvastatin (LIPITOR) 40 MG tablet, Take 1 tablet (40 mg total) by mouth daily., Disp: 90 tablet, Rfl: 1   Allergies: No Known Allergies  REVIEW OF SYSTEMS:   Review of Systems  Constitutional:  Positive for fatigue. Negative for chills and fever.  HENT:   Negative for lump/mass, mouth sores, nosebleeds, sore throat and trouble swallowing.   Eyes:  Negative for eye problems.  Respiratory:  Negative for cough and shortness of breath.   Cardiovascular:  Negative for chest pain, leg swelling and palpitations.  Gastrointestinal:  Positive for diarrhea. Negative for abdominal pain, constipation, nausea and vomiting.  Genitourinary:  Negative for bladder incontinence, difficulty urinating, dysuria, frequency, hematuria and nocturia.   Musculoskeletal:  Negative for arthralgias, back pain, flank pain, myalgias and neck pain.  Skin:  Negative for itching and rash.  Neurological:  Negative  for dizziness, headaches and numbness.  Hematological:  Does not bruise/bleed easily.  Psychiatric/Behavioral:  Negative for depression, sleep disturbance and suicidal  ideas. The patient is not nervous/anxious.   All other systems reviewed and are negative.    VITALS:   Blood pressure 130/66, pulse 64, temperature 97.6 F (36.4 C), temperature source Tympanic, resp. rate 18, weight 161 lb 1.6 oz (73.1 kg), SpO2 98 %.  Wt Readings from Last 3 Encounters:  08/22/22 161 lb 1.6 oz (73.1 kg)  05/19/22 156 lb 8.4 oz (71 kg)  03/03/22 161 lb 6.4 oz (73.2 kg)    Body mass index is 29.47 kg/m.  PHYSICAL EXAM:   Physical Exam Vitals and nursing note reviewed. Exam conducted with a chaperone present.  Constitutional:      Appearance: Normal appearance.  Cardiovascular:     Rate and Rhythm: Normal rate and regular rhythm.     Pulses: Normal pulses.     Heart sounds: Normal heart sounds.  Pulmonary:     Effort: Pulmonary effort is normal.     Breath sounds: Normal breath sounds.  Abdominal:     Palpations: Abdomen is soft. There is no hepatomegaly, splenomegaly or mass.     Tenderness: There is no abdominal tenderness.  Musculoskeletal:     Right lower leg: No edema.     Left lower leg: No edema.  Lymphadenopathy:     Cervical: No cervical adenopathy.     Right cervical: No superficial, deep or posterior cervical adenopathy.    Left cervical: No superficial, deep or posterior cervical adenopathy.     Upper Body:     Right upper body: No supraclavicular or axillary adenopathy.     Left upper body: No supraclavicular or axillary adenopathy.  Neurological:     General: No focal deficit present.     Mental Status: He is alert and oriented to person, place, and time.  Psychiatric:        Mood and Affect: Mood normal.        Behavior: Behavior normal.     LABS:      Latest Ref Rng & Units 08/15/2022   12:37 PM 05/12/2022   11:10 AM 01/10/2022   10:19 AM  CBC  WBC 4.0 - 10.5 K/uL  21.3  19.9  17.4   Hemoglobin 13.0 - 17.0 g/dL 14.8  14.6  14.9   Hematocrit 39.0 - 52.0 % 48.5  45.6  46.8   Platelets 150 - 400 K/uL 176  159  196       Latest Ref Rng & Units 08/15/2022   12:50 PM 08/15/2022   12:37 PM 05/12/2022   11:10 AM  CMP  Glucose 70 - 99 mg/dL 87  86  133   BUN 8 - 23 mg/dL 12  11  14    Creatinine 0.61 - 1.24 mg/dL 1.43  1.40  1.45   Sodium 135 - 145 mmol/L 141  142  136   Potassium 3.5 - 5.1 mmol/L 4.1  3.9  4.0   Chloride 98 - 111 mmol/L 109  108  108   CO2 22 - 32 mmol/L 26  27  22    Calcium 8.9 - 10.3 mg/dL 8.9  9.0  8.8   Total Protein 6.5 - 8.1 g/dL  6.3  6.1   Total Bilirubin 0.3 - 1.2 mg/dL  1.1  0.9   Alkaline Phos 38 - 126 U/L  82  77   AST 15 - 41 U/L  24  20   ALT 0 - 44 U/L  19  13      Lab Results  Component Value Date  CEA 1.2 03/21/2014   /  CEA  Date Value Ref Range Status  03/21/2014 1.2 0.0 - 5.0 ng/mL Final    Comment:    Performed at [facility]   No results found for: "PSA1" No results found for: "CAN199" No results found for: "CAN125"  Lab Results  Component Value Date   TOTALPROTELP 6.7 11/25/2008   TOTALPROTELP 6.9 11/25/2008   ALBUMINELP 62.5 11/25/2008   A1GS 3.8 11/25/2008   A2GS 9.1 11/25/2008   BETS 6.1 11/25/2008   BETA2SER 4.3 11/25/2008   GAMS 14.2 11/25/2008   MSPIKE NOT DETECTED 11/25/2008   SPEI  11/25/2008    (NOTE) The possibility of a faint restricted band(s) cannot be completely excluded in gamma region.   Quantitative Immunoglobulins and Immunofixation will be performed as reflex orders. Reviewed by Odis Hollingshead, MD, PhD, FCAP (Electronic Signature  on File)   Lab Results  Component Value Date   TIBC 296 01/10/2022   TIBC 299 09/06/2021   TIBC 316 04/26/2021   FERRITIN 123 01/10/2022   FERRITIN 84 09/06/2021   FERRITIN 112 04/26/2021   IRONPCTSAT 26 01/10/2022   IRONPCTSAT 27 09/06/2021   IRONPCTSAT 28 04/26/2021   Lab Results  Component Value Date   LDH 126  08/15/2022   LDH 117 05/12/2022   LDH 132 01/10/2022     STUDIES:   CT Abdomen Pelvis Wo Contrast  Result Date: 08/17/2022 CLINICAL DATA:  Chronic lymphocytic leukemia. Left lower quadrant abdominal pain. Weight loss. * Tracking Code: BO * EXAM: CT ABDOMEN AND PELVIS WITHOUT CONTRAST TECHNIQUE: Multidetector CT imaging of the abdomen and pelvis was performed following the standard protocol without IV contrast. RADIATION DOSE REDUCTION: This exam was performed according to the departmental dose-optimization program which includes automated exposure control, adjustment of the mA and/or kV according to patient size and/or use of iterative reconstruction technique. COMPARISON:  09/08/2017. FINDINGS: Lower chest: No acute abnormality. Hepatobiliary: 4 mm low-density structure in right lobe of liver is too small to characterize. No suspicious liver lesions. Gallstones are noted measuring up to 5 mm. No gallbladder wall inflammation or bile duct dilatation. Pancreas: Unremarkable. No pancreatic ductal dilatation or surrounding inflammatory changes. Spleen: Normal in size without focal abnormality. Adrenals/Urinary Tract: Normal adrenal glands. No nephrolithiasis or hydronephrosis. Exophytic cyst arising off the upper pole of left kidney measures 1 cm, image 29/2. No follow-up imaging recommended. Urinary bladder is unremarkable. Stomach/Bowel: Stomach is normal. The appendix is not confidently identified. Sigmoid diverticulosis without signs of acute diverticulitis. No pathologic dilatation of the large or small bowel loops. Vascular/Lymphatic: Aortic atherosclerosis.  IVC filter identified. Increased multiplicity non pathologically enlarged retroperitoneal lymph nodes identified. Previous index left periaortic lymph node measures 0.9 cm, image 38/2. On the previous exam this measured 1 cm. No pathologically enlarged pelvic or inguinal lymph nodes. Reproductive: Prostate gland is enlarged. Other: No ascites or  focal fluid collections. No signs of pneumoperitoneum. Musculoskeletal: No acute or significant osseous findings. L5-S1 degenerative disc disease. IMPRESSION: 1. No acute findings within the abdomen or pelvis. 2. No mass or adenopathy identified. Increased multiplicity of non-pathologically enlarged retroperitoneal lymph nodes identified. Largest lymph node is in the left periaortic region measuring 0.9 cm. On the previous exam this measured 1 cm. 3. Sigmoid diverticulosis without signs of acute diverticulitis. 4. Gallstones. 5. Enlarged prostate gland. 6.  Aortic Atherosclerosis (ICD10-I70.0). Electronically Signed   By: Kerby Moors M.D.   On: 08/17/2022 07:15

## 2022-08-22 ENCOUNTER — Encounter: Payer: Self-pay | Admitting: Hematology

## 2022-08-22 ENCOUNTER — Inpatient Hospital Stay: Payer: Medicare HMO | Admitting: Hematology

## 2022-08-22 VITALS — BP 130/66 | HR 64 | Temp 97.6°F | Resp 18 | Wt 161.1 lb

## 2022-08-22 DIAGNOSIS — D500 Iron deficiency anemia secondary to blood loss (chronic): Secondary | ICD-10-CM | POA: Diagnosis not present

## 2022-08-22 DIAGNOSIS — D631 Anemia in chronic kidney disease: Secondary | ICD-10-CM | POA: Diagnosis not present

## 2022-08-22 DIAGNOSIS — Z7902 Long term (current) use of antithrombotics/antiplatelets: Secondary | ICD-10-CM | POA: Diagnosis not present

## 2022-08-22 DIAGNOSIS — N183 Chronic kidney disease, stage 3 unspecified: Secondary | ICD-10-CM | POA: Diagnosis not present

## 2022-08-22 DIAGNOSIS — C9110 Chronic lymphocytic leukemia of B-cell type not having achieved remission: Secondary | ICD-10-CM

## 2022-08-22 DIAGNOSIS — Z79899 Other long term (current) drug therapy: Secondary | ICD-10-CM | POA: Diagnosis not present

## 2022-08-22 DIAGNOSIS — Z87891 Personal history of nicotine dependence: Secondary | ICD-10-CM | POA: Diagnosis not present

## 2022-08-22 DIAGNOSIS — Z86718 Personal history of other venous thrombosis and embolism: Secondary | ICD-10-CM | POA: Diagnosis not present

## 2022-08-22 NOTE — Patient Instructions (Addendum)
[facility] at [facility] Discharge Instructions   You were seen and examined today by Dr. Delton Coombes.  He reviewed the results of your lab work which are normal/stable.   He reviewed the results of your CT scan which do not show any evidence of cancer.   Return as scheduled.    Thank you for choosing [facility] at [facility] to provide your oncology and hematology care.  To afford each patient quality time with our provider, please arrive at least 15 minutes before your scheduled appointment time.   If you have a lab appointment with the [facility] please come in thru the Main Entrance and check in at the main information desk.  You need to re-schedule your appointment should you arrive 10 or more minutes late.  We strive to give you quality time with our providers, and arriving late affects you and other patients whose appointments are after yours.  Also, if you no show three or more times for appointments you may be dismissed from the clinic at the providers discretion.     Again, thank you for choosing [facility].  Our hope is that these requests will decrease the amount of time that you wait before being seen by our physicians.       _____________________________________________________________  Should you have questions after your visit to [facility], please contact our office at [phone] and follow the prompts.  Our office hours are 8:00 a.m. and 4:30 p.m. Monday - Friday.  Please note that voicemails left after 4:00 p.m. may not be returned until the following business day.  We are closed weekends and major holidays.  You do have access to a nurse 24-7, just call the main number to the clinic [phone] and do not press any options, hold on the line and a nurse will answer the phone.    For prescription refill requests, have your pharmacy contact our office and allow 72 hours.    Due to Covid, you  will need to wear a mask upon entering the hospital. If you do not have a mask, a mask will be given to you at the Main Entrance upon arrival. For doctor visits, patients may have 1 support person age 67 or older with them. For treatment visits, patients can not have anyone with them due to social distancing guidelines and our immunocompromised population.

## 2022-08-24 DIAGNOSIS — N1831 Chronic kidney disease, stage 3a: Secondary | ICD-10-CM | POA: Diagnosis not present

## 2022-08-24 DIAGNOSIS — I5042 Chronic combined systolic (congestive) and diastolic (congestive) heart failure: Secondary | ICD-10-CM | POA: Diagnosis not present

## 2022-08-24 DIAGNOSIS — I129 Hypertensive chronic kidney disease with stage 1 through stage 4 chronic kidney disease, or unspecified chronic kidney disease: Secondary | ICD-10-CM | POA: Diagnosis not present

## 2022-08-24 DIAGNOSIS — D472 Monoclonal gammopathy: Secondary | ICD-10-CM | POA: Diagnosis not present

## 2022-08-24 DIAGNOSIS — E211 Secondary hyperparathyroidism, not elsewhere classified: Secondary | ICD-10-CM | POA: Diagnosis not present

## 2022-08-24 DIAGNOSIS — C9190 Lymphoid leukemia, unspecified not having achieved remission: Secondary | ICD-10-CM | POA: Diagnosis not present

## 2022-09-19 DIAGNOSIS — D8481 Immunodeficiency due to conditions classified elsewhere: Secondary | ICD-10-CM | POA: Diagnosis not present

## 2022-09-19 DIAGNOSIS — J209 Acute bronchitis, unspecified: Secondary | ICD-10-CM | POA: Diagnosis not present

## 2022-09-19 DIAGNOSIS — J069 Acute upper respiratory infection, unspecified: Secondary | ICD-10-CM | POA: Diagnosis not present

## 2022-09-19 DIAGNOSIS — J019 Acute sinusitis, unspecified: Secondary | ICD-10-CM | POA: Diagnosis not present

## 2022-09-20 DIAGNOSIS — U071 COVID-19: Secondary | ICD-10-CM | POA: Diagnosis not present

## 2022-09-20 DIAGNOSIS — C9110 Chronic lymphocytic leukemia of B-cell type not having achieved remission: Secondary | ICD-10-CM | POA: Diagnosis not present

## 2022-10-04 DIAGNOSIS — B351 Tinea unguium: Secondary | ICD-10-CM | POA: Diagnosis not present

## 2022-10-04 DIAGNOSIS — B353 Tinea pedis: Secondary | ICD-10-CM | POA: Diagnosis not present

## 2022-10-04 DIAGNOSIS — I739 Peripheral vascular disease, unspecified: Secondary | ICD-10-CM | POA: Diagnosis not present

## 2022-10-13 DIAGNOSIS — M5416 Radiculopathy, lumbar region: Secondary | ICD-10-CM | POA: Diagnosis not present

## 2022-10-13 DIAGNOSIS — M79604 Pain in right leg: Secondary | ICD-10-CM | POA: Diagnosis not present

## 2022-10-20 DIAGNOSIS — C9110 Chronic lymphocytic leukemia of B-cell type not having achieved remission: Secondary | ICD-10-CM | POA: Diagnosis not present

## 2022-10-20 DIAGNOSIS — U071 COVID-19: Secondary | ICD-10-CM | POA: Diagnosis not present

## 2022-11-07 ENCOUNTER — Emergency Department (HOSPITAL_COMMUNITY)
Admission: EM | Admit: 2022-11-07 | Discharge: 2022-11-07 | Disposition: A | Discharge status: Home / Self Care | Payer: Medicare HMO | Attending: Emergency Medicine | Admitting: Emergency Medicine

## 2022-11-07 ENCOUNTER — Emergency Department (HOSPITAL_COMMUNITY): Payer: Medicare HMO

## 2022-11-07 ENCOUNTER — Encounter (HOSPITAL_COMMUNITY): Payer: Self-pay | Admitting: Emergency Medicine

## 2022-11-07 ENCOUNTER — Other Ambulatory Visit: Payer: Self-pay

## 2022-11-07 DIAGNOSIS — F0390 Unspecified dementia without behavioral disturbance: Secondary | ICD-10-CM | POA: Diagnosis not present

## 2022-11-07 DIAGNOSIS — Z7902 Long term (current) use of antithrombotics/antiplatelets: Secondary | ICD-10-CM | POA: Insufficient documentation

## 2022-11-07 DIAGNOSIS — I110 Hypertensive heart disease with heart failure: Secondary | ICD-10-CM | POA: Diagnosis not present

## 2022-11-07 DIAGNOSIS — Z79899 Other long term (current) drug therapy: Secondary | ICD-10-CM | POA: Insufficient documentation

## 2022-11-07 DIAGNOSIS — R55 Syncope and collapse: Secondary | ICD-10-CM | POA: Diagnosis not present

## 2022-11-07 DIAGNOSIS — M79672 Pain in left foot: Secondary | ICD-10-CM | POA: Diagnosis not present

## 2022-11-07 DIAGNOSIS — R600 Localized edema: Secondary | ICD-10-CM | POA: Diagnosis not present

## 2022-11-07 DIAGNOSIS — E860 Dehydration: Secondary | ICD-10-CM | POA: Diagnosis not present

## 2022-11-07 DIAGNOSIS — I951 Orthostatic hypotension: Secondary | ICD-10-CM | POA: Diagnosis not present

## 2022-11-07 DIAGNOSIS — I509 Heart failure, unspecified: Secondary | ICD-10-CM | POA: Insufficient documentation

## 2022-11-07 LAB — URINALYSIS, ROUTINE W REFLEX MICROSCOPIC
Bilirubin Urine: NEGATIVE
Glucose, UA: NEGATIVE mg/dL
Hgb urine dipstick: NEGATIVE
Ketones, ur: NEGATIVE mg/dL
Leukocytes,Ua: NEGATIVE
Nitrite: NEGATIVE
Protein, ur: NEGATIVE mg/dL
Specific Gravity, Urine: 1.015 (ref 1.005–1.030)
pH: 6 (ref 5.0–8.0)

## 2022-11-07 LAB — BASIC METABOLIC PANEL
Anion gap: 9 (ref 5–15)
BUN: 12 mg/dL (ref 8–23)
CO2: 24 mmol/L (ref 22–32)
Calcium: 9.2 mg/dL (ref 8.9–10.3)
Chloride: 106 mmol/L (ref 98–111)
Creatinine, Ser: 1.86 mg/dL — ABNORMAL HIGH (ref 0.61–1.24)
GFR, Estimated: 37 mL/min — ABNORMAL LOW (ref >60)
Glucose, Bld: 141 mg/dL — ABNORMAL HIGH (ref 70–99)
Potassium: 4 mmol/L (ref 3.5–5.1)
Sodium: 139 mmol/L (ref 135–145)

## 2022-11-07 LAB — CBC
HCT: 47 % (ref 39.0–52.0)
Hemoglobin: 14.7 g/dL (ref 13.0–17.0)
MCH: 30.2 pg (ref 26.0–34.0)
MCHC: 31.3 g/dL (ref 30.0–36.0)
MCV: 96.5 fL (ref 80.0–100.0)
Platelets: 190 10*3/uL (ref 150–400)
RBC: 4.87 MIL/uL (ref 4.22–5.81)
RDW: 14.6 % (ref 11.5–15.5)
WBC: 34 10*3/uL — ABNORMAL HIGH (ref 4.0–10.5)
nRBC: 0 % (ref 0.0–0.2)

## 2022-11-07 LAB — TROPONIN I (HIGH SENSITIVITY)
Troponin I (High Sensitivity): 12 ng/L (ref <18)
Troponin I (High Sensitivity): 16 ng/L (ref <18)

## 2022-11-07 LAB — CBG MONITORING, ED: Glucose-Capillary: 133 mg/dL — ABNORMAL HIGH (ref 70–99)

## 2022-11-07 LAB — BRAIN NATRIURETIC PEPTIDE: B Natriuretic Peptide: 65 pg/mL (ref 0.0–100.0)

## 2022-11-07 MED ORDER — LACTATED RINGERS IV BOLUS
250.0000 mL | Freq: Once | INTRAVENOUS | Status: AC
  Administered 2022-11-07: 250 mL via INTRAVENOUS

## 2022-11-07 MED ORDER — LACTATED RINGERS IV BOLUS
500.0000 mL | Freq: Once | INTRAVENOUS | Status: AC
  Administered 2022-11-07: 500 mL via INTRAVENOUS

## 2022-11-07 NOTE — ED Notes (Signed)
Pt unable to provide urine sample at this time. Urinal is at bedside within reach

## 2022-11-07 NOTE — ED Triage Notes (Signed)
Pt via POV with wife reporting that they went for a walk and on return home pt became very sweaty and almost passed out. They went to urgent care and found that his BP was fluctuating from low to high, so UC recommended ER eval. Pt also reports left foot pain and has 2+ non-pitting edema to foot and ankle, no known injury.

## 2022-11-07 NOTE — ED Provider Notes (Signed)
[place] EMERGENCY DEPARTMENT AT [facility] Provider Note   CSN: [id number] Arrival date & time: 11/07/22  1341     History  Chief Complaint  Patient presents with   Near Syncope    [name] is a 77 y.o. male.  He has past medical history of CLL, CHF, dementia, prior stent in his heart.  He is here with his wife who helps with history.  She states they went on a walk today, it was cool out to start and then got progressively warmer, they walked up a large hill.  On the way back home the patient stated he started getting tired unit as he was very sweaty.  He did not have any chest pain or shortness of breath.  She took him to urgent care where they did orthostatic vitals he was 118/82 lying down, 100/74 sitting and 96/70 standing.  Heart rates are not documented.  Patient also noted pain to the dorsum of his toes of the left foot when walking today.  Urgent care did it with his boot on for couple of weeks.  No fever or chills, no swelling beyond his baseline.  No injury or trauma.  His wife states that he is supposed to take Lasix but has not been taking it as prescribed.  He has lower extremity edema, she states this is baseline, no calf tenderness, no increased swelling, no increased redness of the legs.   Near Syncope       Home Medications Prior to Admission medications   Medication Sig Start Date End Date Taking? Authorizing Provider  atorvastatin (LIPITOR) 40 MG tablet Take 1 tablet (40 mg total) by mouth daily. 11/19/20 03/03/22  Elenore Paddy, NP  clopidogrel (PLAVIX) 75 MG tablet TAKE 1 TABLET BY MOUTH EVERY DAY 11/05/20   Lilly Cove C, MD  [facility] VITAMIN B12 1000 MCG TBCR TAKE 1 TABLET BY MOUTH EVERY DAY 09/08/20   Lilly Cove C, MD  furosemide (LASIX) 20 MG tablet TAKE 1 TABLET BY MOUTH EVERY DAY 11/05/20   Lilly Cove C, MD  lisinopril (ZESTRIL) 2.5 MG tablet TAKE 1 TABLET BY MOUTH EVERY DAY 12/18/20   Marykay Lex, MD  memantine (NAMENDA) 5 MG  tablet Take 5 mg by mouth 2 (two) times daily. 12/11/21   [provider]  nitroGLYCERIN (NITROSTAT) 0.4 MG SL tablet Place 0.4 mg under the tongue every 5 (five) minutes as needed for chest pain.    [provider]  ondansetron (ZOFRAN-ODT) 4 MG disintegrating tablet Take 1 tablet (4 mg total) by mouth every 8 (eight) hours as needed for nausea or vomiting. 03/12/22   Particia Nearing, PA-C  pantoprazole (PROTONIX) 40 MG tablet TAKE 1 TABLET BY MOUTH EVERY DAY 05/18/22   Carlan, Jeral Pinch, NP      Allergies    Patient has no known allergies.    Review of Systems   Review of Systems  Cardiovascular:  Positive for near-syncope.    Physical Exam Updated Vital Signs BP 135/84 (BP Location: Right Arm)   Pulse (!) 105   Temp 98.9 F (37.2 C)   Resp 18   Ht 5\' 2"  (1.575 m)   Wt 72.6 kg   SpO2 97%   BMI 29.26 kg/m  Physical Exam Vitals and nursing note reviewed.  Constitutional:      General: He is not in acute distress.    Appearance: He is well-developed.  HENT:     Head: Normocephalic and atraumatic.  Eyes:     Conjunctiva/sclera: Conjunctivae normal.  Cardiovascular:     Rate and Rhythm: Normal rate and regular rhythm.     Heart sounds: No murmur heard. Pulmonary:     Effort: Pulmonary effort is normal. No respiratory distress.     Breath sounds: Normal breath sounds.  Abdominal:     Palpations: Abdomen is soft.     Tenderness: There is no abdominal tenderness.  Musculoskeletal:        General: No swelling.     Cervical back: Neck supple.     Right lower leg: Edema present.     Left lower leg: Edema present.  Skin:    General: Skin is warm and dry.     Capillary Refill: Capillary refill takes less than 2 seconds.  Neurological:     General: No focal deficit present.     Mental Status: He is alert. Mental status is at baseline.     Motor: No weakness.     Gait: Gait normal.  Psychiatric:        Mood and Affect: Mood normal.     ED Results  / Procedures / Treatments   Labs (all labs ordered are listed, but only abnormal results are displayed) Labs Reviewed  BASIC METABOLIC PANEL - Abnormal; Notable for the following components:      Result Value   Glucose, Bld 141 (*)    Creatinine, Ser 1.86 (*)    GFR, Estimated 37 (*)    All other components within normal limits  CBC - Abnormal; Notable for the following components:   WBC 34.0 (*)    All other components within normal limits  CBG MONITORING, ED - Abnormal; Notable for the following components:   Glucose-Capillary 133 (*)    All other components within normal limits  URINALYSIS, ROUTINE W REFLEX MICROSCOPIC  TROPONIN I (HIGH SENSITIVITY)    EKG None  Radiology No results found.  Procedures Procedures    Medications Ordered in ED Medications  lactated ringers bolus 250 mL (has no administration in time range)    ED Course/ Medical Decision Making/ A&P Clinical Course as of 11/07/22 1759  Mon Nov 07, 2022  1726 Became presyncopal while outside walking today.  Orthostatic at urgent care and sent to the ED for further evaluation, troponin and EKGs reassuring.  Given IV fluids gently due to history of CHF.  Chest x-ray and BNP both normal, do feel patient is having heart failure exacerbation.  He is feeling much better, initially was mildly tachycardic and his creatinine was slightly elevated was also supporting that he had some dehydration contributing to his symptoms.  He has CLL, white count 35 slightly elevated from previous, but patient can follow-up as an outpatient for this. [CB]    Clinical Course User Index [CB] Ma Rings, PA-C                             Medical Decision Making This patient presents to the ED for concern of lightheadedness, near syncope, this involves an extensive number of treatment options, and is a complaint that carries with it a high risk of complications and morbidity.  The differential diagnosis includes near syncope,  dehydration, heart block, CS, vertigo, other   Co morbidities that complicate the patient evaluation  CLL, heart disease, hypertension, CHF, dementia   Additional history obtained:  Additional history obtained from EMR External records from outside source obtained and reviewed  including prior notes, prior labs (primarily CBC and BMP)   Lab Tests:  I Ordered, and personally interpreted labs.  The pertinent results include:  4 the patient was chronic, from his CLL, BMP shows increase of creatinine to 1.86 from his baseline of 1.4, troponin negative, BNP normal   Imaging Studies ordered:  I ordered imaging studies including chest x-ray, left foot x-ray I independently visualized and interpreted imaging which showed very shows no pulmonary edema or infiltrate, left foot x-ray shows no fracture, dislocation I agree with the radiologist interpretation     Problem List / ED Course / Critical interventions / Medication management  1.  Near syncope-likely due to dehydration given history of being outside and walking in the warm weather, walking up a steep hill.  No chest pain or shortness of breath with this, EKG and troponins are reassuring.  He is feeling much better after IV fluids.  Creatinine is slightly elevated from baseline supporting some mild dehydration.  2.  Left foot pain that has been present for weeks, patient has excellent pulses in his feet, no ulcerations or wounds.  Capillary refill is brisk in the toes.  X-ray showed no fracture or dislocation.  He is not having pain at this time, his wife states she thinks maybe his shoes are too tight, because his feet were red when he took her shoes off and this resolved after taking his shoes off.  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  Patient does have significant cardiac history and was having some swelling today though even though he has a rule out of his troponins advise close cardiology follow-up.  They were also advised to follow-up with oncology due to increasing white blood cell count. Reevaluation of the patient after these medicines showed that the patient improved I have reviewed the patients home medicines and have made adjustments as needed      Amount and/or Complexity of Data Reviewed Labs: ordered. Radiology: ordered.           Final Clinical Impression(s) / ED Diagnoses Final diagnoses:  None    Rx / DC Orders ED Discharge Orders     None         Josem Kaufmann 11/07/22 2009    Bethann Berkshire, MD 11/10/22 1113

## 2022-11-07 NOTE — ED Notes (Signed)
Patient verbalizes understanding of discharge instructions. Opportunity for questioning and answers were provided. Armband removed by staff, pt discharged from ED. Ambulated out to lobby with wife  

## 2022-11-07 NOTE — Discharge Instructions (Addendum)
Pleasure taking care of you today.  Your dizzy feeling was likely from being dehydrated, make sure you water, avoid exercising outside when it is hot, follow-up very close with your primary care doctor.  If you do have any new or worsening symptoms, make sure you come back to the ER right away.  He got very sweaty while walking and were feeling well and have history of heart disease I also think is important to follow-up with the heart doctor.  They should call you soon for an appointment.  If you do not hear from the next couple of days you should call and make an appointment with them.  If you have any new or worsening symptoms come back to the ER right away.

## 2022-11-08 DIAGNOSIS — I2510 Atherosclerotic heart disease of native coronary artery without angina pectoris: Secondary | ICD-10-CM | POA: Diagnosis not present

## 2022-11-08 DIAGNOSIS — I509 Heart failure, unspecified: Secondary | ICD-10-CM | POA: Diagnosis not present

## 2022-11-08 DIAGNOSIS — R55 Syncope and collapse: Secondary | ICD-10-CM | POA: Diagnosis not present

## 2022-11-08 HISTORY — DX: Syncope and collapse: R55

## 2022-11-11 ENCOUNTER — Ambulatory Visit (HOSPITAL_BASED_OUTPATIENT_CLINIC_OR_DEPARTMENT_OTHER): Payer: Medicare HMO | Admitting: Family

## 2022-11-11 VITALS — BP 132/68 | HR 85 | Ht 62.0 in | Wt 165.8 lb

## 2022-11-11 DIAGNOSIS — I25118 Atherosclerotic heart disease of native coronary artery with other forms of angina pectoris: Secondary | ICD-10-CM | POA: Diagnosis not present

## 2022-11-11 DIAGNOSIS — I10 Essential (primary) hypertension: Secondary | ICD-10-CM

## 2022-11-11 DIAGNOSIS — E785 Hyperlipidemia, unspecified: Secondary | ICD-10-CM

## 2022-11-11 MED ORDER — FUROSEMIDE 20 MG PO TABS: 20.0000 mg | ORAL_TABLET | ORAL | 3 refills | Status: DC | PRN

## 2022-11-11 NOTE — Patient Instructions (Signed)
Medication Instructions:  Your physician has recommended you make the following change in your medication:  Change: Lasix daily as needed for swelling   IF YOU TAKE LASIX DO NOT TAKE LISINOPRIL THAT DAY  *If you need a refill on your cardiac medications before your next appointment, please call your pharmacy*  Follow-Up: At [facility], you and your health needs are our priority.  As part of our continuing mission to provide you with exceptional heart care, we have created designated Provider Care Teams.  These Care Teams include your primary Cardiologist (physician) and Advanced Practice Providers (APPs -  Physician Assistants and Nurse Practitioners) who all work together to provide you with the care you need, when you need it.  We recommend signing up for the patient portal called "MyChart".  Sign up information is provided on this After Visit Summary.  MyChart is used to connect with patients for Virtual Visits (Telemedicine).  Patients are able to view lab/test results, encounter notes, upcoming appointments, etc.  Non-urgent messages can be sent to your provider as well.   To learn more about what you can do with MyChart, go to [email or web address].    Your next appointment:   FOLLOW UP AS SCHEDULED   Other Instructions Orthostatic Hypotension Blood pressure is a measurement of how strongly, or weakly, your circulating blood is pressing against the walls of your arteries. Orthostatic hypotension is a drop in blood pressure that can happen when you change positions, such as when you go from lying down to standing. Arteries are blood vessels that carry blood from your heart throughout your body. When blood pressure is too low, you may not get enough blood to your brain or to the rest of your organs. Orthostatic hypotension can cause light-headedness, sweating, rapid heartbeat, blurred vision, and fainting. These symptoms require further investigation into the cause. What are the  causes? Orthostatic hypotension can be caused by many things, including: Sudden changes in posture, such as standing up quickly after you have been sitting or lying down. Loss of blood (anemia) or loss of body fluids (dehydration). Heart problems, neurologic problems, or hormone problems. Pregnancy. Aging. The risk for this condition increases as you get older. Severe infection (sepsis). Certain medicines, such as medicines for high blood pressure or medicines that make the body lose excess fluids (diuretics). What are the signs or symptoms? Symptoms of this condition may include: Weakness, light-headedness, or dizziness. Sweating. Blurred vision. Tiredness (fatigue). Rapid heartbeat. Fainting, in severe cases. How is this diagnosed? This condition is diagnosed based on: Your symptoms and medical history. Your blood pressure measurements. Your health care provider will check your blood pressure when you are: Lying down. Sitting. Standing. A blood pressure reading is recorded as two numbers, such as "120 over 80" (or 120/80). The first ("top") number is called the systolic pressure. It is a measure of the pressure in your arteries as your heart beats. The second ("bottom") number is called the diastolic pressure. It is a measure of the pressure in your arteries when your heart relaxes between beats. Blood pressure is measured in a unit called mmHg. Healthy blood pressure for most adults is 120/80 mmHg. Orthostatic hypotension is defined as a 20 mmHg drop in systolic pressure or a 10 mmHg drop in diastolic pressure within 3 minutes of standing. Other information or tests that may be used to diagnose orthostatic hypotension include: Your other vital signs, such as your heart rate and temperature. Blood tests. An electrocardiogram (ECG) or echocardiogram. A  Holter monitor. This is a device you wear that records your heart rhythm continuously, usually for 24-48 hours. Tilt table test. For  this test, you will be safely secured to a table that moves you from a lying position to an upright position. Your heart rhythm and blood pressure will be monitored during the test. How is this treated? This condition may be treated by: Changing your diet. This may involve eating more salt (sodium) or drinking more water. Changing the dosage of certain medicines you are taking that might be lowering your blood pressure. Correcting the underlying reason for the orthostatic hypotension. Wearing compression stockings. Taking medicines to raise your blood pressure. Avoiding actions that trigger symptoms. Follow these instructions at home: Medicines Take over-the-counter and prescription medicines only as told by your health care provider. Follow instructions from your health care provider about changing the dosage of your current medicines, if this applies. Do not stop or adjust any of your medicines on your own. Eating and drinking  Drink enough fluid to keep your urine pale yellow. Eat extra salt only as directed. Do not add extra salt to your diet unless advised by your health care provider. Eat frequent, small meals. Avoid standing up suddenly after eating. General instructions  Get up slowly from lying down or sitting positions. This gives your blood pressure a chance to adjust. Avoid hot showers and excessive heat as directed by your health care provider. Engage in regular physical activity as directed by your health care provider. If you have compression stockings, wear them as told. Keep all follow-up visits. This is important. Contact a health care provider if: You have a fever for more than 2-3 days. You feel more thirsty than usual. You feel dizzy or weak. Get help right away if: You have chest pain. You have a fast or irregular heartbeat. You become sweaty or feel light-headed. You feel short of breath. You faint. You have any symptoms of a stroke. "BE FAST" is an easy  way to remember the main warning signs of a stroke: B - Balance. Signs are dizziness, sudden trouble walking, or loss of balance. E - Eyes. Signs are trouble seeing or a sudden change in vision. F - Face. Signs are sudden weakness or numbness of the face, or the face or eyelid drooping on one side. A - Arms. Signs are weakness or numbness in an arm. This happens suddenly and usually on one side of the body. S - Speech. Signs are sudden trouble speaking, slurred speech, or trouble understanding what people say. T - Time. Time to call emergency services. Write down what time symptoms started. You have other signs of a stroke, such as: A sudden, severe headache with no known cause. Nausea or vomiting. Seizure. These symptoms may represent a serious problem that is an emergency. Do not wait to see if the symptoms will go away. Get medical help right away. Call your local emergency services (911 in the [place] Do not drive yourself to the hospital. Summary Orthostatic hypotension is a sudden drop in blood pressure. It can cause light-headedness, sweating, rapid heartbeat, blurred vision, and fainting. Orthostatic hypotension can be diagnosed by having your blood pressure taken while lying down, sitting, and then standing. Treatment may involve changing your diet, wearing compression stockings, sitting up slowly, adjusting your medicines, or correcting the underlying reason for the orthostatic hypotension. Get help right away if you have chest pain, a fast or irregular heartbeat, or symptoms of a stroke. This information is  not intended to replace advice given to you by your health care provider. Make sure you discuss any questions you have with your health care provider. Document Revised: 08/06/2020 Document Reviewed: 08/06/2020 Elsevier Patient Education  2024 [employer].

## 2022-11-11 NOTE — Progress Notes (Unsigned)
Office Visit    Patient Name: [name] Date of Encounter: 11/11/2022  PCP:  Benita Stabile, MD   [facility]  Cardiologist:  Bryan Lemma, MD *** Advanced Practice Provider:  No care team member to display Electrophysiologist:  None  {Press F2 to show EP APP, CHF, sleep or structural heart MD               :[id number]}  { Click here to update then REFRESH NOTE - MD (PCP) or APP (Team Member)  Change PCP Type for MD, Specialty for APP is either Cardiology or Clinical Cardiac Electrophysiology  :[id number]}  Chief Complaint    [name] is a 77 y.o. male presents today for ***   Past Medical History    Past Medical History:  Diagnosis Date   Acute MI, inferolateral wall, initial episode of care (HCC) 06/16/2015   99% dCx    BPH (benign prostatic hyperplasia) 04/08/2011   CAD S/P percutaneous coronary angioplasty 11/23/12; 06/16/15   a. NSTEMI 2014 with 2 overlapping mRCA lesions - Xience Xpedition DES 2.75 mm x 18 mm (3.0 mm). b. Inf-Lat STEMI 06/16/2015 - dCx Asp Thrombectomy & PCI 3.0 x 15 Xience drDES (3.6 mm).   Chronic combined systolic and diastolic CHF (congestive heart failure) (HCC)    Chronic headache    CKD (chronic kidney disease), stage III (HCC)    CLL (chronic lymphocytic leukemia) (HCC) 04/08/2011   Dementia (HCC)    Depression    Diverticulitis    DVT (deep venous thrombosis) (HCC) 2021   Left, no anticoagulation due to history of diverticular bleeding, IVC filter placed.   Gallstone    GERD (gastroesophageal reflux disease)    Hypercholesteremia    Ischemic cardiomyopathy    Echo 06/18/2015 EF 35-40% after lateral MI -> repeat echo 08/06/2015: EF 40-45% with basal-mid inferolateral akinesis and hypokinesis of the lateral wall.   NSTEMI (non-ST elevated myocardial infarction) (HCC) 11/23/2012   NSTEMI 11/23/2012 RCA lesion - PCI with DES; moderate LAD disease   PTSD (post-traumatic stress disorder)    With Depression - since MI    Past Surgical History:  Procedure Laterality Date   APPENDECTOMY  1968-70   CARDIAC CATHETERIZATION N/A 06/16/2015   Procedure: Left Heart Cath and Coronary Angiography;  Surgeon: Corky Crafts, MD;  Location: [facility] INVASIVE CV LAB;  Service: Cardiovascular: 99% thrombotic dCx -> asp thrombectomy & PCI. RCA Stents Patent.    CARDIAC CATHETERIZATION N/A 06/16/2015   Procedure: Coronary Stent Intervention;  Surgeon: Corky Crafts, MD;  Location: [facility] INVASIVE CV LAB;  Service: Cardiovascular: PCI dCx = thrombectomy -> 3.0 x 15 Xience drug-eluting stent (3.6 mm)    COLONOSCOPY  2008   NWG:[id number]-ZHYQM diverticula, diminutive polyp in the cecum, s/p bx Normal rectum. Tubular adenoma   COLONOSCOPY N/A 10/17/2012   Dr. Jena Gauss: colonic diverticulosis, tubular adenoma, surveillance due May 2019   COLONOSCOPY N/A 02/21/2018   Procedure: COLONOSCOPY;  Surgeon: Corbin Ade, MD; Diverticulosis of the sigmoid and descending colon, otherwise normal exam.  No recommendations to repeat.   CORONARY STENT PLACEMENT  11/23/2012   Mid RCA -- Xience eXp - 2.75 mm x 18 mm DES (3.6mm) , Dr. Herbie Baltimore   ESOPHAGOGASTRODUODENOSCOPY N/A 03/03/2021   Procedure: ESOPHAGOGASTRODUODENOSCOPY (EGD);  Surgeon: Corbin Ade, MD;  Location[facility];  Service: Endoscopy;  Laterality: N/A;  12:00pm   IR IVC FILTER PLMT / S&I Lenise Arena GUID/MOD SED  05/27/2019  IR RADIOLOGIST EVAL & MGMT  09/11/2019   LEFT HEART CATHETERIZATION WITH CORONARY ANGIOGRAM N/A 11/23/2012   Procedure: LEFT HEART CATHETERIZATION WITH CORONARY ANGIOGRAM;  Surgeon: Marykay Lex, MD;  Location: [facility] CATH LAB;  Service: Cardiovascular: NSTEMI: Culprit = mid RCA 95% & 75%; LAD ~40-50%, Small RI ~60%; EF ~60%, mild inf-basal HK      MALONEY DILATION N/A 03/03/2021   Procedure: MALONEY DILATION;  Surgeon: Corbin Ade, MD;  Location: [facility];  Service: Endoscopy;  Laterality: N/A;   TRANSTHORACIC ECHOCARDIOGRAM  January 2017; March 2017    a. EF 35-40%. Entire inferior hypokinesis and akinesis of the basal and mid inferolateral and anterolateral /distal lateral walls;; b. EF 40 and 45%. Akinesis of basal-mid inferolateral wall. Hypokinesis of entire lateral wall.    Allergies  No Known Allergies  History of Present Illness    [name] is a 77 y.o. male with a hx of CLL, CHF, dementia, CAD s/p stent to RCA *** last seen ***.   Echo 06/2015 LHC with patent tss Visit assisted by his wife  TElls me prior to his ED visit  Drinks a litlte bit of water, cocoa for breakfast, occasional soda -  Eats 3 meals per day  Edema has been unchanged   EKGs/Labs/Other Studies Reviewed:   The following studies were reviewed today: Cardiac Studies & Procedures   CARDIAC CATHETERIZATION  CARDIAC CATHETERIZATION 06/16/2015  Narrative  Patent stent in the RCA  Dist Cx thrombotic lesion, 99% stenosed which was the culprit for today's presentation. Post intervention with aspiration thrombectomy and a 3.0 x 15 Xience drug-eluting stent, postdilated to 3.6 mm, there is a 0% residual stenosis.  There is moderate left ventricular systolic dysfunction.  Continue dual antiplatelet therapy for at least a year. Will change clopidogrel to Brilinta 90 mg twice a day. Continue IV tirofiban for 2 hours postprocedure. He'll need aggressive medical therapy for his LV dysfunction. Continue statin and beta blocker as well. He'll be watched in the ICU.  Findings Coronary Findings Diagnostic  Dominance: Right  Left Anterior Descending The vessel exhibits minimal luminal irregularities.  Second Diagonal Branch The vessel exhibits minimal luminal irregularities.  Ramus Intermedius . Vessel is small.  Left Circumflex Collaterals Dist Cx filled by collaterals from 3rd RPL.  With heavy thrombusand has right-to-left collateral flow.  Right Coronary Artery  Previously placed Mid RCA drug eluting stent is patent.  Intervention  Dist  Cx lesion Thrombectomy An aspiration thrombectomy was performed on the lesion.  Thrombectomy was successful.   Other comments: large thrombus removed Supplies used: BALLN EUPHORA RX 2.5X15; STENT XIENCE ALPINE RX 3.0X15; BALLN Minong EUPHORA RX 3.5X12 PCI An unspecified stent was placed. There is a 0% residual stenosis post intervention.     ECHOCARDIOGRAM  ECHOCARDIOGRAM COMPLETE 10/05/2021  Narrative ECHOCARDIOGRAM REPORT    Patient Name:   [name] Date of Exam: 10/05/2021 Medical Rec #:  [id number]      Height:       67.0 in Accession #:    [id number]     Weight:       173.0 lb Date of Birth:  August 27, 1945     BSA:          1.901 m Patient Age:    75 years       BP:           103/67 mmHg Patient Gender: M  HR:           56 bpm. [facility]  Procedure: 2D Echo, Color Doppler, Cardiac Doppler and Strain Analysis  Indications:    Edema  History:        Patient has prior history of Echocardiogram examinations, most recent 08/06/2015. Previous Myocardial Infarction; Risk Factors:Non-Smoker, Hypertension and Dyslipidemia. Covid 19.  Sonographer:    Jeryl Columbia RDCS Referring Phys: [id number] MICHELE M LENZE  IMPRESSIONS   1. Left ventricular ejection fraction, by estimation, is 55 to 60%. The left ventricle has normal function. The left ventricle has no regional wall motion abnormalities. Left ventricular diastolic parameters are indeterminate. 2. Right ventricular systolic function is normal. The right ventricular size is normal. Tricuspid regurgitation signal is inadequate for assessing PA pressure. 3. The mitral valve is abnormal. Mild mitral valve regurgitation. No evidence of mitral stenosis. 4. The aortic valve is tricuspid. There is mild calcification of the aortic valve. There is mild thickening of the aortic valve. Aortic valve regurgitation is not visualized. No aortic stenosis is present. 5. The inferior vena cava is normal in size with greater  than 50% respiratory variability, suggesting right atrial pressure of 3 mmHg.  FINDINGS Left Ventricle: Left ventricular ejection fraction, by estimation, is 55 to 60%. The left ventricle has normal function. The left ventricle has no regional wall motion abnormalities. The left ventricular internal cavity size was normal in size. There is no left ventricular hypertrophy. Left ventricular diastolic parameters are indeterminate.  Right Ventricle: The right ventricular size is normal. Right vetricular wall thickness was not well visualized. Right ventricular systolic function is normal. Tricuspid regurgitation signal is inadequate for assessing PA pressure.  Left Atrium: Left atrial size was normal in size.  Right Atrium: Right atrial size was normal in size.  Pericardium: The pericardium was not well visualized.  Mitral Valve: The mitral valve is abnormal. Mild mitral valve regurgitation. No evidence of mitral valve stenosis.  Tricuspid Valve: The tricuspid valve is normal in structure. Tricuspid valve regurgitation is not demonstrated. No evidence of tricuspid stenosis.  Aortic Valve: The aortic valve is tricuspid. There is mild calcification of the aortic valve. There is mild thickening of the aortic valve. There is mild aortic valve annular calcification. Aortic valve regurgitation is not visualized. No aortic stenosis is present. Aortic valve mean gradient measures 2.4 mmHg. Aortic valve peak gradient measures 5.1 mmHg. Aortic valve area, by VTI measures 2.31 cm.  Pulmonic Valve: The pulmonic valve was not well visualized. Pulmonic valve regurgitation is not visualized. No evidence of pulmonic stenosis.  Aorta: The aortic root is normal in size and structure.  Venous: The inferior vena cava is normal in size with greater than 50% respiratory variability, suggesting right atrial pressure of 3 mmHg.  IAS/Shunts: The interatrial septum was not well visualized.   LEFT VENTRICLE PLAX  2D LVIDd:         4.20 cm   Diastology LVIDs:         3.00 cm   LV e' medial:    6.42 cm/s LV PW:         1.00 cm   LV E/e' medial:  9.8 LV IVS:        1.00 cm   LV e' lateral:   9.46 cm/s LVOT diam:     2.00 cm   LV E/e' lateral: 6.7 LV SV:         49 LV SV Index:   26 LVOT Area:  3.14 cm  3D Volume EF: 3D EF:        70 % LV EDV:       167 ml LV ESV:       50 ml LV SV:        117 ml  RIGHT VENTRICLE RV Basal diam:  3.40 cm RV Mid diam:    2.80 cm RV S prime:     16.90 cm/s TAPSE (M-mode): 1.6 cm  LEFT ATRIUM             Index        RIGHT ATRIUM          Index LA diam:        3.50 cm 1.84 cm/m   RA Area:     8.48 cm LA Vol (A2C):   37.4 ml 19.67 ml/m  RA Volume:   16.00 ml 8.42 ml/m LA Vol (A4C):   29.5 ml 15.52 ml/m LA Biplane Vol: 35.8 ml 18.83 ml/m AORTIC VALVE AV Area (Vmax):    1.89 cm AV Area (Vmean):   1.87 cm AV Area (VTI):     2.31 cm AV Vmax:           112.93 cm/s AV Vmean:          72.283 cm/s AV VTI:            0.214 m AV Peak Grad:      5.1 mmHg AV Mean Grad:      2.4 mmHg LVOT Vmax:         67.90 cm/s LVOT Vmean:        43.100 cm/s LVOT VTI:          0.157 m LVOT/AV VTI ratio: 0.73  AORTA Ao Root diam: 3.10 cm  MITRAL VALVE MV Area (PHT): 4.26 cm    SHUNTS MV Decel Time: 178 msec    Systemic VTI:  0.16 m MR Peak grad: 42.0 mmHg    Systemic Diam: 2.00 cm MR Vmax:      324.00 cm/s MV E velocity: 63.00 cm/s MV A velocity: 62.10 cm/s MV E/A ratio:  1.01  Dina Rich MD Electronically signed by Dina Rich MD Signature Date/Time: 10/05/2021/12:40:42 PM    Final              EKG:  EKG is *** ordered today.  The ekg ordered today demonstrates ***  Recent Labs: 08/15/2022: ALT 19 11/07/2022: B Natriuretic Peptide 65.0; BUN 12; Creatinine, Ser 1.86; Hemoglobin 14.7; Platelets 190; Potassium 4.0; Sodium 139  Recent Lipid Panel    Component Value Date/Time   CHOL 133 11/18/2020 1104   CHOL 129 12/14/2016 1007   TRIG 140  11/18/2020 1104   HDL 28 (L) 11/18/2020 1104   HDL 32 (L) 12/14/2016 1007   CHOLHDL 4.8 11/18/2020 1104   VLDL 24 06/16/2015 1425   LDLCALC 81 11/18/2020 1104    Risk Assessment/Calculations:  {Does this patient have ATRIAL FIBRILLATION?:[phone]}  Home Medications   Current Meds  Medication Sig   clopidogrel (PLAVIX) 75 MG tablet TAKE 1 TABLET BY MOUTH EVERY DAY   [employer] VITAMIN B12 1000 MCG TBCR TAKE 1 TABLET BY MOUTH EVERY DAY   furosemide (LASIX) 20 MG tablet TAKE 1 TABLET BY MOUTH EVERY DAY   lisinopril (ZESTRIL) 2.5 MG tablet TAKE 1 TABLET BY MOUTH EVERY DAY   memantine (NAMENDA) 5 MG tablet Take 5 mg by mouth 2 (two) times daily.   nitroGLYCERIN (NITROSTAT) 0.4 MG SL tablet Place  0.4 mg under the tongue every 5 (five) minutes as needed for chest pain.   ondansetron (ZOFRAN-ODT) 4 MG disintegrating tablet Take 1 tablet (4 mg total) by mouth every 8 (eight) hours as needed for nausea or vomiting.   pantoprazole (PROTONIX) 40 MG tablet TAKE 1 TABLET BY MOUTH EVERY DAY     Review of Systems   ***   All other systems reviewed and are otherwise negative except as noted above.  Physical Exam    VS:  BP 132/68   Pulse 85   Ht 5\' 2"  (1.575 m)   Wt 165 lb 12.8 oz (75.2 kg)   SpO2 96%   BMI 30.33 kg/m  , BMI Body mass index is 30.33 kg/m.  Wt Readings from Last 3 Encounters:  11/11/22 165 lb 12.8 oz (75.2 kg)  11/07/22 160 lb (72.6 kg)  08/22/22 161 lb 1.6 oz (73.1 kg)     GEN: Well nourished, well developed, in no acute distress. HEENT: normal. Neck: Supple, no JVD, carotid bruits, or masses. Cardiac: ***RRR, no murmurs, rubs, or gallops. No clubbing, cyanosis, edema.  ***Radials/PT 2+ and equal bilaterally.  Respiratory:  ***Respirations regular and unlabored, clear to auscultation bilaterally. GI: Soft, nontender, nondistended. MS: No deformity or atrophy. Skin: Warm and dry, no rash. Neuro:  Strength and sensation are intact. Psych: Normal affect.  Assessment &  Plan    *** HLD - 06/2022 LDL 53          Disposition: Follow up {follow up:15908} with Bryan Lemma, MD or APP.  Signed, Alver Sorrow, NP 11/11/2022, 11:41 AM [facility]

## 2022-11-13 ENCOUNTER — Encounter (HOSPITAL_BASED_OUTPATIENT_CLINIC_OR_DEPARTMENT_OTHER): Payer: Self-pay | Admitting: Family

## 2022-11-20 DIAGNOSIS — C9110 Chronic lymphocytic leukemia of B-cell type not having achieved remission: Secondary | ICD-10-CM | POA: Diagnosis not present

## 2022-11-20 DIAGNOSIS — U071 COVID-19: Secondary | ICD-10-CM | POA: Diagnosis not present

## 2022-11-22 DIAGNOSIS — N1831 Chronic kidney disease, stage 3a: Secondary | ICD-10-CM | POA: Diagnosis not present

## 2022-11-22 DIAGNOSIS — D631 Anemia in chronic kidney disease: Secondary | ICD-10-CM | POA: Diagnosis not present

## 2022-11-22 DIAGNOSIS — I129 Hypertensive chronic kidney disease with stage 1 through stage 4 chronic kidney disease, or unspecified chronic kidney disease: Secondary | ICD-10-CM | POA: Diagnosis not present

## 2022-11-22 DIAGNOSIS — R809 Proteinuria, unspecified: Secondary | ICD-10-CM | POA: Diagnosis not present

## 2022-11-29 DIAGNOSIS — N2581 Secondary hyperparathyroidism of renal origin: Secondary | ICD-10-CM | POA: Diagnosis not present

## 2022-11-29 DIAGNOSIS — I5042 Chronic combined systolic (congestive) and diastolic (congestive) heart failure: Secondary | ICD-10-CM | POA: Diagnosis not present

## 2022-11-29 DIAGNOSIS — I129 Hypertensive chronic kidney disease with stage 1 through stage 4 chronic kidney disease, or unspecified chronic kidney disease: Secondary | ICD-10-CM | POA: Diagnosis not present

## 2022-11-29 DIAGNOSIS — N1831 Chronic kidney disease, stage 3a: Secondary | ICD-10-CM | POA: Diagnosis not present

## 2022-12-15 DIAGNOSIS — R7303 Prediabetes: Secondary | ICD-10-CM | POA: Diagnosis not present

## 2022-12-15 DIAGNOSIS — I10 Essential (primary) hypertension: Secondary | ICD-10-CM | POA: Diagnosis not present

## 2022-12-16 LAB — LAB REPORT - SCANNED
A1c: 6.4
Albumin, Urine POC: 16.9
Creatinine, POC: 139.1 mg/dL
EGFR: 45
Microalb Creat Ratio: 12

## 2022-12-20 DIAGNOSIS — U071 COVID-19: Secondary | ICD-10-CM | POA: Diagnosis not present

## 2022-12-20 DIAGNOSIS — C9110 Chronic lymphocytic leukemia of B-cell type not having achieved remission: Secondary | ICD-10-CM | POA: Diagnosis not present

## 2022-12-21 ENCOUNTER — Telehealth: Payer: Self-pay | Admitting: *Deleted

## 2022-12-21 DIAGNOSIS — I82409 Acute embolism and thrombosis of unspecified deep veins of unspecified lower extremity: Secondary | ICD-10-CM | POA: Diagnosis not present

## 2022-12-21 DIAGNOSIS — I700 Atherosclerosis of aorta: Secondary | ICD-10-CM | POA: Insufficient documentation

## 2022-12-21 DIAGNOSIS — N1832 Chronic kidney disease, stage 3b: Secondary | ICD-10-CM | POA: Diagnosis not present

## 2022-12-21 DIAGNOSIS — E785 Hyperlipidemia, unspecified: Secondary | ICD-10-CM | POA: Diagnosis not present

## 2022-12-21 DIAGNOSIS — I509 Heart failure, unspecified: Secondary | ICD-10-CM | POA: Diagnosis not present

## 2022-12-21 DIAGNOSIS — I10 Essential (primary) hypertension: Secondary | ICD-10-CM | POA: Diagnosis not present

## 2022-12-21 DIAGNOSIS — I2510 Atherosclerotic heart disease of native coronary artery without angina pectoris: Secondary | ICD-10-CM | POA: Diagnosis not present

## 2022-12-21 DIAGNOSIS — I130 Hypertensive heart and chronic kidney disease with heart failure and stage 1 through stage 4 chronic kidney disease, or unspecified chronic kidney disease: Secondary | ICD-10-CM | POA: Diagnosis not present

## 2022-12-21 DIAGNOSIS — E8809 Other disorders of plasma-protein metabolism, not elsewhere classified: Secondary | ICD-10-CM | POA: Diagnosis not present

## 2022-12-21 DIAGNOSIS — R1310 Dysphagia, unspecified: Secondary | ICD-10-CM | POA: Diagnosis not present

## 2022-12-21 DIAGNOSIS — C9110 Chronic lymphocytic leukemia of B-cell type not having achieved remission: Secondary | ICD-10-CM | POA: Diagnosis not present

## 2022-12-21 DIAGNOSIS — R7303 Prediabetes: Secondary | ICD-10-CM | POA: Diagnosis not present

## 2022-12-21 DIAGNOSIS — D631 Anemia in chronic kidney disease: Secondary | ICD-10-CM | POA: Diagnosis not present

## 2022-12-21 HISTORY — DX: Atherosclerosis of aorta: I70.0

## 2022-12-21 NOTE — Telephone Encounter (Signed)
Received recent labs and request for earlier appointment from Dr. Margo Aye, due to elevated wbc of 23.5.  Patient has CLL with history of increased white counts.  Spoke with patient and he denies any new symptoms of fevers, night sweats or other associated symptoms.  Case discussed with Dr. Ellin Saba and he feels that he can follow up as planned in October unless he develops new or worsening symptoms.  Patient aware.

## 2023-01-10 DIAGNOSIS — L603 Nail dystrophy: Secondary | ICD-10-CM | POA: Diagnosis not present

## 2023-01-10 DIAGNOSIS — I739 Peripheral vascular disease, unspecified: Secondary | ICD-10-CM | POA: Diagnosis not present

## 2023-01-20 DIAGNOSIS — C9110 Chronic lymphocytic leukemia of B-cell type not having achieved remission: Secondary | ICD-10-CM | POA: Diagnosis not present

## 2023-01-20 DIAGNOSIS — U071 COVID-19: Secondary | ICD-10-CM | POA: Diagnosis not present

## 2023-01-20 DIAGNOSIS — M545 Low back pain, unspecified: Secondary | ICD-10-CM | POA: Diagnosis not present

## 2023-01-20 DIAGNOSIS — M1712 Unilateral primary osteoarthritis, left knee: Secondary | ICD-10-CM | POA: Diagnosis not present

## 2023-01-20 DIAGNOSIS — M1711 Unilateral primary osteoarthritis, right knee: Secondary | ICD-10-CM | POA: Diagnosis not present

## 2023-02-07 DIAGNOSIS — F0390 Unspecified dementia without behavioral disturbance: Secondary | ICD-10-CM | POA: Diagnosis not present

## 2023-02-07 DIAGNOSIS — Z79899 Other long term (current) drug therapy: Secondary | ICD-10-CM | POA: Diagnosis not present

## 2023-02-07 DIAGNOSIS — E785 Hyperlipidemia, unspecified: Secondary | ICD-10-CM | POA: Diagnosis not present

## 2023-02-13 ENCOUNTER — Encounter (HOSPITAL_COMMUNITY): Payer: Self-pay | Admitting: Hematology

## 2023-02-13 DIAGNOSIS — I739 Peripheral vascular disease, unspecified: Secondary | ICD-10-CM | POA: Diagnosis not present

## 2023-02-13 DIAGNOSIS — L603 Nail dystrophy: Secondary | ICD-10-CM | POA: Diagnosis not present

## 2023-02-16 ENCOUNTER — Other Ambulatory Visit: Payer: Self-pay | Admitting: Gastroenterology

## 2023-02-16 NOTE — Telephone Encounter (Signed)
Patient previously stated he would begin to receive refills of his pantoprazole through his primary care provider doctor and follow-up with [place] as needed.  If I need to refill them, I certainly can, but I would need to start seeing him again in the office so that we can keep this updated. Please let me know what he prefers.

## 2023-02-17 ENCOUNTER — Other Ambulatory Visit: Payer: Self-pay

## 2023-02-17 ENCOUNTER — Inpatient Hospital Stay: Payer: Medicare HMO | Attending: Hematology

## 2023-02-17 DIAGNOSIS — Z79899 Other long term (current) drug therapy: Secondary | ICD-10-CM | POA: Diagnosis not present

## 2023-02-17 DIAGNOSIS — N1831 Chronic kidney disease, stage 3a: Secondary | ICD-10-CM

## 2023-02-17 DIAGNOSIS — Z87891 Personal history of nicotine dependence: Secondary | ICD-10-CM | POA: Diagnosis not present

## 2023-02-17 DIAGNOSIS — D500 Iron deficiency anemia secondary to blood loss (chronic): Secondary | ICD-10-CM

## 2023-02-17 DIAGNOSIS — D631 Anemia in chronic kidney disease: Secondary | ICD-10-CM | POA: Insufficient documentation

## 2023-02-17 DIAGNOSIS — Z7902 Long term (current) use of antithrombotics/antiplatelets: Secondary | ICD-10-CM | POA: Diagnosis not present

## 2023-02-17 DIAGNOSIS — N183 Chronic kidney disease, stage 3 unspecified: Secondary | ICD-10-CM | POA: Insufficient documentation

## 2023-02-17 DIAGNOSIS — F0390 Unspecified dementia without behavioral disturbance: Secondary | ICD-10-CM | POA: Diagnosis not present

## 2023-02-17 DIAGNOSIS — C9110 Chronic lymphocytic leukemia of B-cell type not having achieved remission: Secondary | ICD-10-CM | POA: Insufficient documentation

## 2023-02-17 DIAGNOSIS — Z86718 Personal history of other venous thrombosis and embolism: Secondary | ICD-10-CM | POA: Insufficient documentation

## 2023-02-17 LAB — CBC WITH DIFFERENTIAL/PLATELET
Abs Immature Granulocytes: 0 10*3/uL (ref 0.00–0.07)
Basophils Absolute: 0 10*3/uL (ref 0.0–0.1)
Basophils Relative: 0 %
Eosinophils Absolute: 0 10*3/uL (ref 0.0–0.5)
Eosinophils Relative: 0 %
HCT: 46 % (ref 39.0–52.0)
Hemoglobin: 14.3 g/dL (ref 13.0–17.0)
Lymphocytes Relative: 85 %
Lymphs Abs: 20.4 10*3/uL — ABNORMAL HIGH (ref 0.7–4.0)
MCH: 30.1 pg (ref 26.0–34.0)
MCHC: 31.1 g/dL (ref 30.0–36.0)
MCV: 96.8 fL (ref 80.0–100.0)
Monocytes Absolute: 0.5 10*3/uL (ref 0.1–1.0)
Monocytes Relative: 2 %
Neutro Abs: 3.1 10*3/uL (ref 1.7–7.7)
Neutrophils Relative %: 13 %
Platelets: 206 10*3/uL (ref 150–400)
RBC: 4.75 MIL/uL (ref 4.22–5.81)
RDW: 14.8 % (ref 11.5–15.5)
WBC: 24 10*3/uL — ABNORMAL HIGH (ref 4.0–10.5)
nRBC: 0 % (ref 0.0–0.2)

## 2023-02-17 LAB — RENAL FUNCTION PANEL
Albumin: 3.5 g/dL (ref 3.5–5.0)
Anion gap: 8 (ref 5–15)
BUN: 12 mg/dL (ref 8–23)
CO2: 26 mmol/L (ref 22–32)
Calcium: 8.9 mg/dL (ref 8.9–10.3)
Chloride: 102 mmol/L (ref 98–111)
Creatinine, Ser: 1.49 mg/dL — ABNORMAL HIGH (ref 0.61–1.24)
GFR, Estimated: 48 mL/min — ABNORMAL LOW (ref >60)
Glucose, Bld: 134 mg/dL — ABNORMAL HIGH (ref 70–99)
Phosphorus: 3 mg/dL (ref 2.5–4.6)
Potassium: 4.1 mmol/L (ref 3.5–5.1)
Sodium: 136 mmol/L (ref 135–145)

## 2023-02-17 LAB — FERRITIN: Ferritin: 109 ng/mL (ref 24–336)

## 2023-02-17 LAB — COMPREHENSIVE METABOLIC PANEL
ALT: 16 U/L (ref 0–44)
AST: 23 U/L (ref 15–41)
Albumin: 3.6 g/dL (ref 3.5–5.0)
Alkaline Phosphatase: 77 U/L (ref 38–126)
Anion gap: 7 (ref 5–15)
BUN: 12 mg/dL (ref 8–23)
CO2: 26 mmol/L (ref 22–32)
Calcium: 8.8 mg/dL — ABNORMAL LOW (ref 8.9–10.3)
Chloride: 103 mmol/L (ref 98–111)
Creatinine, Ser: 1.45 mg/dL — ABNORMAL HIGH (ref 0.61–1.24)
GFR, Estimated: 49 mL/min — ABNORMAL LOW (ref >60)
Glucose, Bld: 133 mg/dL — ABNORMAL HIGH (ref 70–99)
Potassium: 4.1 mmol/L (ref 3.5–5.1)
Sodium: 136 mmol/L (ref 135–145)
Total Bilirubin: 1.1 mg/dL (ref 0.3–1.2)
Total Protein: 6.1 g/dL — ABNORMAL LOW (ref 6.5–8.1)

## 2023-02-17 LAB — IRON AND TIBC
Iron: 62 ug/dL (ref 45–182)
Saturation Ratios: 22 % (ref 17.9–39.5)
TIBC: 279 ug/dL (ref 250–450)
UIBC: 217 ug/dL

## 2023-02-17 LAB — LACTATE DEHYDROGENASE: LDH: 124 U/L (ref 98–192)

## 2023-02-17 LAB — PROTEIN, URINE, RANDOM: Total Protein, Urine: 6 mg/dL

## 2023-02-17 LAB — CREATININE, URINE, RANDOM: Creatinine, Urine: 60 mg/dL

## 2023-02-20 LAB — PTH, INTACT AND CALCIUM
Calcium, Total (PTH): 9.1 mg/dL (ref 8.6–10.2)
PTH: 39 pg/mL (ref 15–65)

## 2023-02-20 NOTE — Telephone Encounter (Signed)
Please see prior note and reach out to patient.

## 2023-02-22 ENCOUNTER — Encounter: Payer: Self-pay | Admitting: Cardiology

## 2023-02-22 ENCOUNTER — Ambulatory Visit: Payer: Medicare HMO | Attending: Cardiology | Admitting: Cardiology

## 2023-02-22 VITALS — BP 116/74 | HR 64 | Ht 64.0 in | Wt 168.4 lb

## 2023-02-22 DIAGNOSIS — E785 Hyperlipidemia, unspecified: Secondary | ICD-10-CM

## 2023-02-22 DIAGNOSIS — R413 Other amnesia: Secondary | ICD-10-CM | POA: Diagnosis not present

## 2023-02-22 DIAGNOSIS — Z9861 Coronary angioplasty status: Secondary | ICD-10-CM | POA: Diagnosis not present

## 2023-02-22 DIAGNOSIS — R600 Localized edema: Secondary | ICD-10-CM | POA: Diagnosis not present

## 2023-02-22 DIAGNOSIS — I2119 ST elevation (STEMI) myocardial infarction involving other coronary artery of inferior wall: Secondary | ICD-10-CM

## 2023-02-22 DIAGNOSIS — I255 Ischemic cardiomyopathy: Secondary | ICD-10-CM | POA: Diagnosis not present

## 2023-02-22 DIAGNOSIS — I214 Non-ST elevation (NSTEMI) myocardial infarction: Secondary | ICD-10-CM

## 2023-02-22 DIAGNOSIS — I10 Essential (primary) hypertension: Secondary | ICD-10-CM

## 2023-02-22 DIAGNOSIS — I2510 Atherosclerotic heart disease of native coronary artery without angina pectoris: Secondary | ICD-10-CM | POA: Diagnosis not present

## 2023-02-22 MED ORDER — LISINOPRIL 2.5 MG PO TABS: 2.5000 mg | ORAL_TABLET | ORAL | 3 refills | Status: DC

## 2023-02-22 MED ORDER — FUROSEMIDE 20 MG PO TABS: 20.0000 mg | ORAL_TABLET | ORAL | 3 refills | Status: DC

## 2023-02-22 NOTE — Progress Notes (Signed)
Cardiology Office Note:  .   Date:  03/05/2023  ID:  [name], DOB 06/20/1945, MRN [id number] PCP: [name] Stabile, MD  [facility] Providers Cardiologist:  [name] Lemma, MD     Chief Complaint  Patient presents with   Follow-up    3 months.  Doing better.  Taking Lasix maybe a couple times a week at the most.  But still having swelling.   Coronary Artery Disease    No angina.  Stable    History of Present Illness: .     [name] is a  77 y.o. male  with a PMH noted below who presents here for ~ 3 month f/u at the request of [name] Stabile, MD.  CAD s/p 11/2012 stent-RCA, 2017 STEMI DES-Cx, ICM (2017 LVEF 35-40% ? 10/2021 LVEF 55-60%), HLD (on Repatha), former tobacco ,CLL, => progressive pleasant dementia  [name] was last seen by me back in February 2022 since then he has been seen intermittently by APP's.  He was subsequently seen by [name] Shields, NP on 11/11/2022 for orthostatic hypotension.  He actually had a syncopal episode on June 3.  He was a warm day and he was walking outside.  Got sweaty and felt tired.  Noted to have orthostatic pressures dropping to 96/70 while standing.  Went to the emergency room at any pain, was given some IV fluids.  Had some creatinine elevation suggestive of dehydration.  Acknowledges that it related to drinking of water during the course of the day.  He usually does not eat 3 meals a day but does not drink enough.  Stable exertional dyspnea. => Lasix changed to be PRN and hold lisinopril when taking Lasix.  Discussed precautions for orthostasis, including encouraging adequate hydration, eating regular meals, wear compression socks and slow position changes.    Subjective  INTERVAL HISTORY [name] returns here today for 25-month follow-up accompanied by his wife who assists with history as he has relatively prominent memory defects. [name] seems to be doing pretty well since the ER visit with no further syncopal episodes.   He has stable lower extremity swelling, but denies any PND or orthopnea.  It does not always go down all the way with his legs up but it usually does okay.  He also has some varicose veins.  Sparingly using the furosemide at this point, but when he does not take it his edema does get worse. He has mild baseline exertional dyspnea but this does not do all that much activity.  For what he is able to do, he denies any chest pain or pressure. No sensation of rapid irregular heartbeats or palpitations.  No recurrent syncope or near syncope.  No TIA or amaurosis fugax.  Not walking enough to notice claudication.  He said he was happy seeing APP's but it is easier for them to be seen in [place] for alternate visits as possible.  ROS:   Review of Systems - Negative except edema and mild exertional dyspnea noted above.  He also has off-and-on constipation as well as back and mild hip and knee pain.  Still has memory loss issues.     Objective   Studies Reviewed: Marland Kitchen   EKG Interpretation Date/Time:  Wednesday February 22 2023 13:42:15 EDT Ventricular Rate:  64 PR Interval:  152 QRS Duration:  64 QT Interval:  406 QTC Calculation: 418 R Axis:   -21  Text Interpretation: Sinus rhythm with occasional Premature ventricular complexes Minimal  voltage criteria for LVH, may be normal variant ( R in aVL ) Septal infarct , age undetermined Inferior infarct , age undetermined When compared with ECG of 07-Nov-2022 14:29, No significant change since last tracing Confirmed by [name] ([id number]) on 02/22/2023 1:50:35 PM    12/15/2022: TC 101, TG 120, HDL 30, LDL 48.  A1c 6.4. 02/17/2023: Hgb 14.3; Cr 1.49, K+ 4.1  Echo 10/05/2021: Normal LV size and function.  EF 55 to 60%.  No RWMA.  Indeterminate diastolic function.  Normal RV but unable to assess RAP.  Mild MR.  AoV calcification with no stenosis. Cardiac Cath 06/16/2015: dLCx 99% thrombotic lesion => thrombectomy Xience DES 3 x 15 (3.6 mm).  Patent RCA  stent.  EF 35 to 45% with anterior and inferior hypokinesis.            Risk Assessment/Calculations:         Physical Exam:   VS:  BP 116/74   Pulse 64   Ht 5\' 4"  (1.626 m)   Wt 168 lb 6.4 oz (76.4 kg)   SpO2 94%   BMI 28.91 kg/m    Wt Readings from Last 3 Encounters:  02/22/23 168 lb 6.4 oz (76.4 kg)  11/11/22 165 lb 12.8 oz (75.2 kg)  11/07/22 160 lb (72.6 kg)    GEN: Well nourished, well developed in no acute distress; no longer obese.  Flat affect.  Poor comprehension.  Poor historian. NECK: No JVD; No carotid bruits CARDIAC: Distant, but normal S1, S2; RRR with occasional ectopy, no murmurs, rubs, gallops; diminished pulses due to edema. RESPIRATORY:  Clear to auscultation without rales, wheezing or rhonchi ; nonlabored, good air movement. ABDOMEN: Soft, non-tender, non-distended EXTREMITIES: Trace to 1+ bilateral LE edema with varicose veins but no dermatitis; No deformity; slow, shuffling gait.     ASSESSMENT AND PLAN: .    Problem List Items Addressed This Visit       Cardiology Problems   CAD S/P PCI-- RCA DES 2014,  CFX DES Jan 2017 - Primary (Chronic)    Distant PCI to the RCA in 2014 followed by LCx PCI in the setting of inferolateral STEMI.  No further anginal symptoms.  Plan: Due to having stents in 2 different arteries, we have chosen to use maintenance therapy clopidogrel 75 mg daily Okay to hold Plavix 5 days preop for surgeries or procedures.  7 days for more high risk surgeries. If he were to have issues with falls or significant bleeding, I would switch him from clopidogrel to aspirin.       Relevant Medications   furosemide (LASIX) 20 MG tablet   lisinopril (ZESTRIL) 2.5 MG tablet   Other Relevant Orders   EKG 12-Lead (Completed)   Coronary artery disease involving native coronary artery of native heart without angina pectoris (Chronic)    Doing well with no recurrent angina.  On stable regimen for now, but we have made some  adjustments.  Currently on atorvastatin, but with his progressive memory loss, we discussed the pros and cons of staying on it versus coming off.  Decision made that we would have him stop statin for now to see how his memory does.  We can reassess at follow-up. He has been having some hypotension issues and had that 1 near syncopal episode => will have him take lisinopril Tuesday Thursday Saturdays and Sundays and then take his furosemide Monday Wednesday Friday. Remains on Plavix monotherapy with hold parameters noted above      Relevant  Medications   furosemide (LASIX) 20 MG tablet   lisinopril (ZESTRIL) 2.5 MG tablet   Dyslipidemia, goal LDL below 70 - on atorvastatin; monitored by PCP (Chronic)    Labs been followed by PCP.  Most recent labs showed pretty well-controlled lipids.  I think you are probably okay stopping his statin to see how it helps his memory issues.  We can reassess on follow-up.      Relevant Medications   furosemide (LASIX) 20 MG tablet   lisinopril (ZESTRIL) 2.5 MG tablet   Essential hypertension (Chronic)    Would prefer to have a little bit of permissive hypertension based on his recent syncopal episode.  He is doing better with reduced dose of lisinopril but I think he still having some dizziness.  Plan: Change lisinopril to 2.5 mg p.o. on Tuesdays, Thursdays no Saturdays and Sundays.  (Alternating with furosemide 20 mg on Mondays, Wednesdays and Fridays)      Relevant Medications   furosemide (LASIX) 20 MG tablet   lisinopril (ZESTRIL) 2.5 MG tablet   Other Relevant Orders   EKG 12-Lead (Completed)   h/o Non-STEMI (non-ST elevated myocardial infarction) (HCC) (Chronic)   Relevant Medications   furosemide (LASIX) 20 MG tablet   lisinopril (ZESTRIL) 2.5 MG tablet   Ischemic cardiomyopathy (Chronic)    Most recent echo from last May showed his EF back to 55 to 60% which is remarkable.  Borderline blood pressures limit GDMT.      Relevant Medications    furosemide (LASIX) 20 MG tablet   lisinopril (ZESTRIL) 2.5 MG tablet   Myocardial infarction of inferolateral wall (HCC) (Chronic)    Peri-infarct LV gram showed inferior and anterolateral hypokinesis, however follow-up echocardiogram still showed improved EF with no obvious RWMA.  No recurrent angina or heart failure symptoms.      Relevant Medications   furosemide (LASIX) 20 MG tablet   lisinopril (ZESTRIL) 2.5 MG tablet     Other   Lower leg edema (Chronic)    I do not really think his edema is related to heart failure as he appears to be relatively euvolemic, without any PND orthopnea.  I suspect that is more likely related to venous stasis.  As such, I would recommend continuing support stockings and foot elevation. However because he does have some underlying edema we can simplify the intermittent dosing regimen.  Plan: Take furosemide 20 mg daily on Monday Wednesday and Friday and then as needed for weight gain more than 3 pounds in a day. Take lisinopril Tuesday Thursday Saturdays and Sundays, but if additional dose of Lasix is required, would hold)      Memory loss (Chronic)    In addition of memory issues, his wife indicates some sundowning issues.  Currently on atorvastatin 40 mg, working to have him stop this and we can reassess at follow-up.  If there is no real change in memory issues would potentially restart this lower dose of rosuvastatin 10 mg.               Dispo: Return in about 6 months (around 08/22/2023) for Alternate 6 month follow-up with APP & MD.  Total time spent: 33 min spent with patient + 23 min spent charting = 56 min     Signed, Marykay Lex, MD, MS [name], M.D., M.S. Interventional Cardiologist  [facility]  Pager # [phone] Phone # [phone] [address] [place], [place] [address]

## 2023-02-22 NOTE — Patient Instructions (Addendum)
Medication Instructions:   Take furosemide ( Lasix) 20 mg Monday - Wednesday-Friday   Take Lisinopril 2.5 mg on Tuesday, Thursday. Saturday- Sunday  *If you need a refill on your cardiac medications before your next appointment, please call your pharmacy*   Lab Work:   Not needed    Testing/Procedures: Not needed   Follow-Up: At [facility], you and your health needs are our priority.  As part of our continuing mission to provide you with exceptional heart care, we have created designated Provider Care Teams.  These Care Teams include your primary Cardiologist (physician) and Advanced Practice Providers (APPs -  Physician Assistants and Nurse Practitioners) who all work together to provide you with the care you need, when you need it.  We recommend signing up for the patient portal called "MyChart".  Sign up information is provided on this After Visit Summary.  MyChart is used to connect with patients for Virtual Visits (Telemedicine).  Patients are able to view lab/test results, encounter notes, upcoming appointments, etc.  Non-urgent messages can be sent to your provider as well.   To learn more about what you can do with MyChart, go to [email or web address].    Your next appointment:   6 month(s)  The format for your next appointment:   In Person  Provider:    You will see one of the following Advanced Practice Providers on your designated Care Team:    Randall An, PA-C  Jacolyn Reedy, PA-C   Then, Bryan Lemma, MD will plan to see you again in 12 month(s).   Other Instructions    Use ace wrap  to wrap legs through  the day and take at bedtime Elevate legs 3 to 4 times a day  and use theHydrographic surveyor ) if you purchase it.

## 2023-02-23 ENCOUNTER — Inpatient Hospital Stay: Payer: Medicare HMO | Admitting: Oncology

## 2023-02-23 VITALS — BP 128/74 | HR 64 | Temp 97.7°F | Resp 16

## 2023-02-23 DIAGNOSIS — D631 Anemia in chronic kidney disease: Secondary | ICD-10-CM | POA: Diagnosis not present

## 2023-02-23 DIAGNOSIS — C9110 Chronic lymphocytic leukemia of B-cell type not having achieved remission: Secondary | ICD-10-CM

## 2023-02-23 DIAGNOSIS — Z7902 Long term (current) use of antithrombotics/antiplatelets: Secondary | ICD-10-CM | POA: Diagnosis not present

## 2023-02-23 DIAGNOSIS — F0390 Unspecified dementia without behavioral disturbance: Secondary | ICD-10-CM | POA: Diagnosis not present

## 2023-02-23 DIAGNOSIS — Z79899 Other long term (current) drug therapy: Secondary | ICD-10-CM | POA: Diagnosis not present

## 2023-02-23 DIAGNOSIS — N183 Chronic kidney disease, stage 3 unspecified: Secondary | ICD-10-CM | POA: Diagnosis not present

## 2023-02-23 DIAGNOSIS — Z86718 Personal history of other venous thrombosis and embolism: Secondary | ICD-10-CM | POA: Diagnosis not present

## 2023-02-23 DIAGNOSIS — Z87891 Personal history of nicotine dependence: Secondary | ICD-10-CM | POA: Diagnosis not present

## 2023-02-23 NOTE — Progress Notes (Signed)
[facility] [address], [place] [address]    Clinic Day:  02/23/2023  Referring physician: Benita Stabile, MD  Patient Care Team: Benita Stabile, MD as PCP - General (Internal Medicine) Marykay Lex, MD as PCP - Cardiology (Cardiology) Jena Gauss Gerrit Friends, MD as Attending Physician (Gastroenterology) Doreatha Massed, MD as Consulting Physician (Hematology and Oncology)   ASSESSMENT & PLAN:   Assessment: 1.  Stage I CLL: -Originally diagnosed in 2008, on watchful waiting since then. -No fevers, night sweats or weight loss.  No recurrent infections.   2.  DVT: -Diagnosed with DVT during hospitalization in December 2020.  IVC filter on 05/27/2019. -Anticoagulation was not started as he had GI bleed secondary to diverticulosis.   3.  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 -Venofer 5 doses from 07/23/2019 through 08/05/2019.  4.  CKD: -Followed by nephrology. -Appears stable. -Creatinine has been between 1.2-1.91 over the past several years.  Plan: 1.  Stage I CLL: - Denies any fevers, night sweats or weight loss in the last 6 months. -Wife reports a great appetite and no weight loss. -Reviewed labs from 02/17/2023 which show a white count of 24.0 (21.3) and elevation in lymphocyte count 20.4.  Hemoglobin and platelet counts are normal.  LDH is normal. -He had a CT abdomen pelvis without contrast on 08/15/2022 for weight loss which did not reveal any acute findings, adenopathy or mass. -Weight has been stable ever since. -Continue follow-up every 6 months.  2.  Dementia: -Currently on Namenda twice daily and has referral to neurology whom he will see on 05/17/2023. -This appears to be worsening per his wife.  3.  Near syncopal episodes: -He was seen in the emergency room on 11/07/2022 for near syncope secondary to dehydration. -She has had several other incidents since then but has not fallen.  He met with his PCP yesterday  who changed his lisinopril and Lasix frequency.  He is now taking Lasix 3 times per week and lisinopril 4 times per week rotating to avoid hypotension.  Per his wife this appears to be helping.     PLAN SUMMARY: >> RTC in 6 months with labs (cbc/d, cmp, ldh)  a few days before and see provider.        No orders of the defined types were placed in this encounter.   Mauro Kaufmann, NP   9/19/20241:05 PM  CHIEF COMPLAINT:   Diagnosis: CLL and anemia   Prior Therapy: none  Current Therapy:  Observation and intermittent Venofer  INTERVAL HISTORY:   [name] is a 77 y.o. male presenting to clinic today for follow up of CLL and anemia.  He was last seen in clinic on 08/22/2022.  In the interim he was seen in the emergency room on 11/07/2022 for near syncope.  Cardiac involvement ruled out and thought to be due to dehydration.  He has had several other incidents of near syncope since June and was evaluated by PCP yesterday who reduced his Lasix and lisinopril.    Today, he presents with his wife.  Wife states overall he has been doing well.  He is eating and drinking and maintaining his weight.  Denies any B symptoms.  Reports worsening memory since he had COVID back in 2020.  He is currently on Namenda twice daily and will be seeing a neurologist in December.  Reports the other night she had to go get him from outside in the backyard because  he was out wandering around.  His appetite is 75% energy levels are 50%.  Denies any pain.   PAST MEDICAL HISTORY:   Past Medical History: Past Medical History:  Diagnosis Date   Acute MI, inferolateral wall, initial episode of care (HCC) 06/16/2015   99% dCx    BPH (benign prostatic hyperplasia) 04/08/2011   CAD S/P percutaneous coronary angioplasty 11/23/12; 06/16/15   a. NSTEMI 2014 with 2 overlapping mRCA lesions - Xience Xpedition DES 2.75 mm x 18 mm (3.0 mm). b. Inf-Lat STEMI 06/16/2015 - dCx Asp Thrombectomy & PCI 3.0 x 15 Xience drDES (3.6  mm).   Chronic combined systolic and diastolic CHF (congestive heart failure) (HCC)    Chronic headache    CKD (chronic kidney disease), stage III (HCC)    CLL (chronic lymphocytic leukemia) (HCC) 04/08/2011   Dementia (HCC)    Depression    Diverticulitis    DVT (deep venous thrombosis) (HCC) 2021   Left, no anticoagulation due to history of diverticular bleeding, IVC filter placed.   Gallstone    GERD (gastroesophageal reflux disease)    Hypercholesteremia    Ischemic cardiomyopathy    Echo 06/18/2015 EF 35-40% after lateral MI -> repeat echo 08/06/2015: EF 40-45% with basal-mid inferolateral akinesis and hypokinesis of the lateral wall.   NSTEMI (non-ST elevated myocardial infarction) (HCC) 11/23/2012   NSTEMI 11/23/2012 RCA lesion - PCI with DES; moderate LAD disease   PTSD (post-traumatic stress disorder)    With Depression - since MI    Surgical History: Past Surgical History:  Procedure Laterality Date   APPENDECTOMY  1968-70   CARDIAC CATHETERIZATION N/A 06/16/2015   Procedure: Left Heart Cath and Coronary Angiography;  Surgeon: Corky Crafts, MD;  Location: [facility] INVASIVE CV LAB;  Service: Cardiovascular: 99% thrombotic dCx -> asp thrombectomy & PCI. RCA Stents Patent.    CARDIAC CATHETERIZATION N/A 06/16/2015   Procedure: Coronary Stent Intervention;  Surgeon: Corky Crafts, MD;  Location: [facility] INVASIVE CV LAB;  Service: Cardiovascular: PCI dCx = thrombectomy -> 3.0 x 15 Xience drug-eluting stent (3.6 mm)    COLONOSCOPY  2008   [id number] diverticula, diminutive polyp in the cecum, s/p bx Normal rectum. Tubular adenoma   COLONOSCOPY N/A 10/17/2012   Dr. Jena Gauss: colonic diverticulosis, tubular adenoma, surveillance due May 2019   COLONOSCOPY N/A 02/21/2018   Procedure: COLONOSCOPY;  Surgeon: Corbin Ade, MD; Diverticulosis of the sigmoid and descending colon, otherwise normal exam.  No recommendations to repeat.   CORONARY STENT PLACEMENT  11/23/2012   Mid RCA  -- Xience eXp - 2.75 mm x 18 mm DES (3.52mm) , Dr. Herbie Baltimore   ESOPHAGOGASTRODUODENOSCOPY N/A 03/03/2021   Procedure: ESOPHAGOGASTRODUODENOSCOPY (EGD);  Surgeon: Corbin Ade, MD;  Location: [facility];  Service: Endoscopy;  Laterality: N/A;  12:00pm   IR IVC FILTER PLMT / S&I /IMG GUID/MOD SED  05/27/2019   IR RADIOLOGIST EVAL & MGMT  09/11/2019   LEFT HEART CATHETERIZATION WITH CORONARY ANGIOGRAM N/A 11/23/2012   Procedure: LEFT HEART CATHETERIZATION WITH CORONARY ANGIOGRAM;  Surgeon: Marykay Lex, MD;  Location[facility] CATH LAB;  Service: Cardiovascular: NSTEMI: Culprit = mid RCA 95% & 75%; LAD ~40-50%, Small RI ~60%; EF ~60%, mild inf-basal HK      MALONEY DILATION N/A 03/03/2021   Procedure: MALONEY DILATION;  Surgeon: Corbin Ade, MD;  Location: [facility];  Service: Endoscopy;  Laterality: N/A;   TRANSTHORACIC ECHOCARDIOGRAM  January 2017; March 2017   a. EF 35-40%. Entire inferior  hypokinesis and akinesis of the basal and mid inferolateral and anterolateral /distal lateral walls;; b. EF 40 and 45%. Akinesis of basal-mid inferolateral wall. Hypokinesis of entire lateral wall.    Social History: Social History   Socioeconomic History   Marital status: Married    Spouse name: Not on file   Number of children: Not on file   Years of education: Not on file   Highest education level: Not on file  Occupational History   Not on file  Tobacco Use   Smoking status: Former    Current packs/day: 0.00    Average packs/day: 0.3 packs/day for 2.0 years (0.5 ttl pk-yrs)    Types: Cigarettes    Start date: 06/06/1961    Quit date: 06/07/1963    Years since quitting: 59.7   Smokeless tobacco: Never  Vaping Use   Vaping status: Never Used  Substance and Sexual Activity   Alcohol use: No    Alcohol/week: 0.0 standard drinks of alcohol    Comment: Occasionally drinks red wine   Drug use: No   Sexual activity: Not on file  Other Topics Concern   Not on file  Social History Narrative    Exercises 3 to 4 days a week at the fitness center for about a half an hour at a time.   "former smoker". Does not drink EtOH   Married.   Social Determinants of Health   Financial Resource Strain: Not on file  Food Insecurity: Not on file  Transportation Needs: Not on file  Physical Activity: Not on file  Stress: Not on file  Social Connections: Not on file  Intimate Partner Violence: Not on file    Family History: Family History  Problem Relation Age of Onset   Diabetes Mother    Cancer Father        prostate   Colon cancer Neg Hx    Esophageal cancer Neg Hx    Stomach cancer Neg Hx     Current Medications:  Current Outpatient Medications:    atorvastatin (LIPITOR) 40 MG tablet, Take 1 tablet (40 mg total) by mouth daily., Disp: 90 tablet, Rfl: 1   clopidogrel (PLAVIX) 75 MG tablet, TAKE 1 TABLET BY MOUTH EVERY DAY, Disp: 90 tablet, Rfl: 1   [employer] VITAMIN B12 1000 MCG TBCR, TAKE 1 TABLET BY MOUTH EVERY DAY, Disp: 60 tablet, Rfl: 3   furosemide (LASIX) 20 MG tablet, Take 1 tablet (20 mg total) by mouth every Monday, Wednesday, and Friday., Disp: 90 tablet, Rfl: 3   lisinopril (ZESTRIL) 2.5 MG tablet, Take 1 tablet (2.5 mg total) by mouth every Tuesday, Thursday, Saturday, and Sunday., Disp: 90 tablet, Rfl: 3   memantine (NAMENDA) 5 MG tablet, Take 5 mg by mouth 2 (two) times daily., Disp: , Rfl:    nitroGLYCERIN (NITROSTAT) 0.4 MG SL tablet, Place 0.4 mg under the tongue every 5 (five) minutes as needed for chest pain., Disp: , Rfl:    ondansetron (ZOFRAN-ODT) 4 MG disintegrating tablet, Take 1 tablet (4 mg total) by mouth every 8 (eight) hours as needed for nausea or vomiting., Disp: 15 tablet, Rfl: 0   pantoprazole (PROTONIX) 40 MG tablet, TAKE 1 TABLET BY MOUTH EVERY DAY, Disp: 90 tablet, Rfl: 2   Allergies: No Known Allergies  REVIEW OF SYSTEMS:   Review of Systems  Constitutional: Negative.  Negative for appetite change, chills, fatigue and fever.  HENT:   Negative.  Negative for hearing loss, lump/mass, mouth sores and nosebleeds.   Eyes:  Negative.  Negative for eye problems.  Respiratory:  Negative for cough, hemoptysis and shortness of breath.   Cardiovascular: Negative.  Negative for chest pain and leg swelling.  Gastrointestinal: Negative.  Negative for abdominal pain, blood in stool, constipation, diarrhea, nausea and vomiting.  Endocrine: Negative.  Negative for hot flashes.  Genitourinary: Negative.  Negative for bladder incontinence, difficulty urinating, dysuria, frequency and hematuria.   Musculoskeletal: Negative.  Negative for back pain, flank pain, gait problem and myalgias.  Skin: Negative.  Negative for itching and rash.  Neurological: Negative.  Negative for dizziness, gait problem, headaches, light-headedness and numbness.  Hematological: Negative.  Negative for adenopathy.  Psychiatric/Behavioral:  Negative for confusion. The patient is not nervous/anxious.      VITALS:   There were no vitals taken for this visit.  Wt Readings from Last 3 Encounters:  02/22/23 168 lb 6.4 oz (76.4 kg)  11/11/22 165 lb 12.8 oz (75.2 kg)  11/07/22 160 lb (72.6 kg)    There is no height or weight on file to calculate BMI.  PHYSICAL EXAM:   Physical Exam Constitutional:      Appearance: Normal appearance.  Cardiovascular:     Rate and Rhythm: Normal rate and regular rhythm.  Pulmonary:     Effort: Pulmonary effort is normal.     Breath sounds: Normal breath sounds.  Abdominal:     General: Bowel sounds are normal.     Palpations: Abdomen is soft.  Musculoskeletal:        General: No swelling. Normal range of motion.  Neurological:     Mental Status: He is alert and oriented to person, place, and time. Mental status is at baseline.     LABS:      Latest Ref Rng & Units 02/17/2023   10:46 AM 11/07/2022    2:23 PM 08/15/2022   12:37 PM  CBC  WBC 4.0 - 10.5 K/uL 24.0  34.0  21.3   Hemoglobin 13.0 - 17.0 g/dL 16.1  09.6  04.5    Hematocrit 39.0 - 52.0 % 46.0  47.0  48.5   Platelets 150 - 400 K/uL 206  190  176       Latest Ref Rng & Units 02/17/2023   11:01 AM 02/17/2023   10:46 AM 11/07/2022    2:23 PM  CMP  Glucose 70 - 99 mg/dL 409  811  914   BUN 8 - 23 mg/dL 12  12  12    Creatinine 0.61 - 1.24 mg/dL 7.82  9.56  2.13   Sodium 135 - 145 mmol/L 136  136  139   Potassium 3.5 - 5.1 mmol/L 4.1  4.1  4.0   Chloride 98 - 111 mmol/L 102  103  106   CO2 22 - 32 mmol/L 26  26  24    Calcium 8.6 - 10.2 mg/dL 8.9 - 08.6 mg/dL 9.1    8.9  8.8  9.2   Total Protein 6.5 - 8.1 g/dL  6.1    Total Bilirubin 0.3 - 1.2 mg/dL  1.1    Alkaline Phos 38 - 126 U/L  77    AST 15 - 41 U/L  23    ALT 0 - 44 U/L  16       Lab Results  Component Value Date   CEA 1.2 03/21/2014   /  CEA  Date Value Ref Range Status  03/21/2014 1.2 0.0 - 5.0 ng/mL Final    Comment:    Performed at [facility]   No results found for: "PSA1" No results found for: "CAN199" No results found for: "CAN125"  Lab Results  Component Value Date   TOTALPROTELP 6.7 11/25/2008   TOTALPROTELP 6.9 11/25/2008   ALBUMINELP 62.5 11/25/2008   A1GS 3.8 11/25/2008   A2GS 9.1 11/25/2008   BETS 6.1 11/25/2008   BETA2SER 4.3 11/25/2008   GAMS 14.2 11/25/2008   MSPIKE NOT DETECTED 11/25/2008   SPEI  11/25/2008    (NOTE) The possibility of a faint restricted band(s) cannot be completely excluded in gamma region.   Quantitative Immunoglobulins and Immunofixation will be performed as reflex orders. Reviewed by Dallas Breeding, MD, PhD, FCAP (Electronic Signature  on File)   Lab Results  Component Value Date   TIBC 279 02/17/2023   TIBC 296 01/10/2022   TIBC 299 09/06/2021   FERRITIN 109 02/17/2023   FERRITIN 123 01/10/2022   FERRITIN 84 09/06/2021   IRONPCTSAT 22 02/17/2023   IRONPCTSAT 26 01/10/2022   IRONPCTSAT 27 09/06/2021   Lab Results  Component Value Date   LDH 124 02/17/2023   LDH 126 08/15/2022   LDH 117 05/12/2022      STUDIES:   No results found.

## 2023-03-05 ENCOUNTER — Encounter: Payer: Self-pay | Admitting: Cardiology

## 2023-03-05 NOTE — Assessment & Plan Note (Signed)
I do not really think his edema is related to heart failure as he appears to be relatively euvolemic, without any PND orthopnea.  I suspect that is more likely related to venous stasis.  As such, I would recommend continuing support stockings and foot elevation. However because he does have some underlying edema we can simplify the intermittent dosing regimen.  Plan: Take furosemide 20 mg daily on Monday Wednesday and Friday and then as needed for weight gain more than 3 pounds in a day. Take lisinopril Tuesday Thursday Saturdays and Sundays, but if additional dose of Lasix is required, would hold)

## 2023-03-05 NOTE — Assessment & Plan Note (Addendum)
Distant PCI to the RCA in 2014 followed by LCx PCI in the setting of inferolateral STEMI.  No further anginal symptoms.  Plan: Due to having stents in 2 different arteries, we have chosen to use maintenance therapy clopidogrel 75 mg daily Okay to hold Plavix 5 days preop for surgeries or procedures.  7 days for more high risk surgeries. If he were to have issues with falls or significant bleeding, I would switch him from clopidogrel to aspirin.

## 2023-03-05 NOTE — Assessment & Plan Note (Signed)
Would prefer to have a little bit of permissive hypertension based on his recent syncopal episode.  He is doing better with reduced dose of lisinopril but I think he still having some dizziness.  Plan: Change lisinopril to 2.5 mg p.o. on Tuesdays, Thursdays no Saturdays and Sundays.  (Alternating with furosemide 20 mg on Mondays, Wednesdays and Fridays)

## 2023-03-05 NOTE — Assessment & Plan Note (Signed)
In addition of memory issues, his wife indicates some sundowning issues.  Currently on atorvastatin 40 mg, working to have him stop this and we can reassess at follow-up.  If there is no real change in memory issues would potentially restart this lower dose of rosuvastatin 10 mg.

## 2023-03-05 NOTE — Assessment & Plan Note (Signed)
Most recent echo from last May showed his EF back to 55 to 60% which is remarkable.  Borderline blood pressures limit GDMT.

## 2023-03-05 NOTE — Assessment & Plan Note (Signed)
Labs been followed by PCP.  Most recent labs showed pretty well-controlled lipids.  I think you are probably okay stopping his statin to see how it helps his memory issues.  We can reassess on follow-up.

## 2023-03-05 NOTE — Assessment & Plan Note (Signed)
Doing well with no recurrent angina.  On stable regimen for now, but we have made some adjustments.  Currently on atorvastatin, but with his progressive memory loss, we discussed the pros and cons of staying on it versus coming off.  Decision made that we would have him stop statin for now to see how his memory does.  We can reassess at follow-up. He has been having some hypotension issues and had that 1 near syncopal episode => will have him take lisinopril Tuesday Thursday Saturdays and Sundays and then take his furosemide Monday Wednesday Friday. Remains on Plavix monotherapy with hold parameters noted above

## 2023-03-05 NOTE — Assessment & Plan Note (Signed)
Peri-infarct LV gram showed inferior and anterolateral hypokinesis, however follow-up echocardiogram still showed improved EF with no obvious RWMA.  No recurrent angina or heart failure symptoms.

## 2023-03-06 NOTE — Telephone Encounter (Signed)
Called patient and [facility] requesting return call regarding this.   Toni Amend, if patient calls back, please find out if he is going to be receiving refills of pantoprazole from his primary care doctor or if he would like for [place] to continue to fill this medication for him.  If I need to refill the medication, I would be more than happy to do so, but I will need to start seeing him again in the office at least yearly rather than as needed.

## 2023-03-08 NOTE — Telephone Encounter (Signed)
Spoke to pt, he informed me that he would get it from his PCP.

## 2023-03-08 NOTE — Telephone Encounter (Signed)
Noted  

## 2023-03-22 DIAGNOSIS — I129 Hypertensive chronic kidney disease with stage 1 through stage 4 chronic kidney disease, or unspecified chronic kidney disease: Secondary | ICD-10-CM | POA: Diagnosis not present

## 2023-03-22 DIAGNOSIS — I5042 Chronic combined systolic (congestive) and diastolic (congestive) heart failure: Secondary | ICD-10-CM | POA: Diagnosis not present

## 2023-03-22 DIAGNOSIS — C9190 Lymphoid leukemia, unspecified not having achieved remission: Secondary | ICD-10-CM | POA: Diagnosis not present

## 2023-03-22 DIAGNOSIS — N1831 Chronic kidney disease, stage 3a: Secondary | ICD-10-CM | POA: Diagnosis not present

## 2023-03-23 ENCOUNTER — Emergency Department (HOSPITAL_COMMUNITY)
Admission: EM | Admit: 2023-03-23 | Discharge: 2023-03-24 | Disposition: A | Discharge status: Home / Self Care | Payer: Medicare HMO | Source: Home / Self Care | Attending: Emergency Medicine | Admitting: Emergency Medicine

## 2023-03-23 ENCOUNTER — Other Ambulatory Visit: Payer: Self-pay

## 2023-03-23 ENCOUNTER — Encounter (HOSPITAL_COMMUNITY): Payer: Self-pay

## 2023-03-23 DIAGNOSIS — Z79899 Other long term (current) drug therapy: Secondary | ICD-10-CM | POA: Insufficient documentation

## 2023-03-23 DIAGNOSIS — J40 Bronchitis, not specified as acute or chronic: Secondary | ICD-10-CM | POA: Diagnosis not present

## 2023-03-23 DIAGNOSIS — I110 Hypertensive heart disease with heart failure: Secondary | ICD-10-CM | POA: Insufficient documentation

## 2023-03-23 DIAGNOSIS — C9110 Chronic lymphocytic leukemia of B-cell type not having achieved remission: Secondary | ICD-10-CM | POA: Insufficient documentation

## 2023-03-23 DIAGNOSIS — I2510 Atherosclerotic heart disease of native coronary artery without angina pectoris: Secondary | ICD-10-CM | POA: Insufficient documentation

## 2023-03-23 DIAGNOSIS — N289 Disorder of kidney and ureter, unspecified: Secondary | ICD-10-CM | POA: Insufficient documentation

## 2023-03-23 DIAGNOSIS — R531 Weakness: Secondary | ICD-10-CM | POA: Insufficient documentation

## 2023-03-23 DIAGNOSIS — F0390 Unspecified dementia without behavioral disturbance: Secondary | ICD-10-CM

## 2023-03-23 DIAGNOSIS — I5020 Unspecified systolic (congestive) heart failure: Secondary | ICD-10-CM | POA: Insufficient documentation

## 2023-03-23 DIAGNOSIS — R4182 Altered mental status, unspecified: Secondary | ICD-10-CM | POA: Diagnosis not present

## 2023-03-23 DIAGNOSIS — Z7902 Long term (current) use of antithrombotics/antiplatelets: Secondary | ICD-10-CM | POA: Insufficient documentation

## 2023-03-23 DIAGNOSIS — A419 Sepsis, unspecified organism: Secondary | ICD-10-CM | POA: Diagnosis not present

## 2023-03-23 HISTORY — DX: Diverticulosis of intestine, part unspecified, without perforation or abscess with bleeding: K57.91

## 2023-03-23 LAB — CBC
HCT: 47.8 % (ref 39.0–52.0)
Hemoglobin: 14.8 g/dL (ref 13.0–17.0)
MCH: 30.2 pg (ref 26.0–34.0)
MCHC: 31 g/dL (ref 30.0–36.0)
MCV: 97.6 fL (ref 80.0–100.0)
Platelets: 187 10*3/uL (ref 150–400)
RBC: 4.9 MIL/uL (ref 4.22–5.81)
RDW: 14.6 % (ref 11.5–15.5)
WBC: 29.8 10*3/uL — ABNORMAL HIGH (ref 4.0–10.5)
nRBC: 0 % (ref 0.0–0.2)

## 2023-03-23 LAB — BASIC METABOLIC PANEL
Anion gap: 9 (ref 5–15)
BUN: 12 mg/dL (ref 8–23)
CO2: 25 mmol/L (ref 22–32)
Calcium: 8.7 mg/dL — ABNORMAL LOW (ref 8.9–10.3)
Chloride: 102 mmol/L (ref 98–111)
Creatinine, Ser: 1.83 mg/dL — ABNORMAL HIGH (ref 0.61–1.24)
GFR, Estimated: 37 mL/min — ABNORMAL LOW (ref >60)
Glucose, Bld: 127 mg/dL — ABNORMAL HIGH (ref 70–99)
Potassium: 4.3 mmol/L (ref 3.5–5.1)
Sodium: 136 mmol/L (ref 135–145)

## 2023-03-23 NOTE — ED Triage Notes (Signed)
Pt has dementia and is from home with family. Wife stated "pt has almost fallen a few times over the past few days." Pt stated " I haven't felt like doing anything." Pt is alert and answering simple questions and able to follow commands. Wife stated "there hasn't been an episode of vomiting."

## 2023-03-24 ENCOUNTER — Emergency Department (HOSPITAL_COMMUNITY): Payer: Medicare HMO

## 2023-03-24 ENCOUNTER — Encounter (HOSPITAL_COMMUNITY): Payer: Self-pay

## 2023-03-24 DIAGNOSIS — M545 Low back pain, unspecified: Secondary | ICD-10-CM | POA: Diagnosis not present

## 2023-03-24 DIAGNOSIS — J40 Bronchitis, not specified as acute or chronic: Secondary | ICD-10-CM | POA: Diagnosis not present

## 2023-03-24 DIAGNOSIS — A419 Sepsis, unspecified organism: Secondary | ICD-10-CM | POA: Diagnosis not present

## 2023-03-24 DIAGNOSIS — R4182 Altered mental status, unspecified: Secondary | ICD-10-CM | POA: Diagnosis not present

## 2023-03-24 LAB — URINALYSIS, ROUTINE W REFLEX MICROSCOPIC
Bilirubin Urine: NEGATIVE
Glucose, UA: NEGATIVE mg/dL
Ketones, ur: NEGATIVE mg/dL
Leukocytes,Ua: NEGATIVE
Nitrite: NEGATIVE
Specific Gravity, Urine: 1.02 (ref 1.005–1.030)
pH: 5.5 (ref 5.0–8.0)

## 2023-03-24 LAB — CBG MONITORING, ED: Glucose-Capillary: 105 mg/dL — ABNORMAL HIGH (ref 70–99)

## 2023-03-24 LAB — HEPATIC FUNCTION PANEL
ALT: 15 U/L (ref 0–44)
AST: 24 U/L (ref 15–41)
Albumin: 3.9 g/dL (ref 3.5–5.0)
Alkaline Phosphatase: 86 U/L (ref 38–126)
Bilirubin, Direct: 0.3 mg/dL — ABNORMAL HIGH (ref 0.0–0.2)
Indirect Bilirubin: 1.2 mg/dL — ABNORMAL HIGH (ref 0.3–0.9)
Total Bilirubin: 1.5 mg/dL — ABNORMAL HIGH (ref 0.3–1.2)
Total Protein: 6.6 g/dL (ref 6.5–8.1)

## 2023-03-24 LAB — LACTIC ACID, PLASMA: Lactic Acid, Venous: 1 mmol/L (ref 0.5–1.9)

## 2023-03-24 LAB — APTT: aPTT: 33 s (ref 24–36)

## 2023-03-24 LAB — PROTIME-INR
INR: 1 (ref 0.8–1.2)
Prothrombin Time: 13.6 s (ref 11.4–15.2)

## 2023-03-24 LAB — URINALYSIS, MICROSCOPIC (REFLEX)
Bacteria, UA: NONE SEEN
RBC / HPF: >50 RBC/hpf (ref 0–5)
Squamous Epithelial / HPF: NONE SEEN /[HPF] (ref 0–5)
WBC, UA: NONE SEEN WBC/hpf (ref 0–5)

## 2023-03-24 MED ORDER — ACETAMINOPHEN 325 MG PO TABS
650.0000 mg | ORAL_TABLET | Freq: Once | ORAL | Status: AC
  Administered 2023-03-24: 650 mg via ORAL
  Filled 2023-03-24: qty 2

## 2023-03-24 NOTE — ED Notes (Signed)
Pt complaining of feeling weak for the last couple of days, hurts when he urinates. Wife is concerned with a UTI

## 2023-03-24 NOTE — ED Provider Notes (Signed)
[place] EMERGENCY DEPARTMENT AT [facility] Provider Note   CSN: [id number] Arrival date & time: 03/23/23  2026     History  Chief Complaint  Patient presents with   Weakness    [name] is a 77 y.o. male.  The history is provided by the patient and the spouse.  Weakness He has history of hypertension, hyperlipidemia, coronary artery disease, chronic lymphocytic leukemia, heart failure with reduced ejection fraction and comes in because of feeling weak today.  He has had episodes of nearly falling but has not actually fallen.  He has had subjective fever and chills.  He denies chest pain or nausea or vomiting.  He is not coughing.   Home Medications Prior to Admission medications   Medication Sig Start Date End Date Taking? Authorizing Provider  atorvastatin (LIPITOR) 40 MG tablet Take 1 tablet (40 mg total) by mouth daily. 11/19/20 03/03/22  Elenore Paddy, NP  clopidogrel (PLAVIX) 75 MG tablet TAKE 1 TABLET BY MOUTH EVERY DAY 11/05/20   Wilson Singer, MD  [facility] VITAMIN B12 1000 MCG TBCR TAKE 1 TABLET BY MOUTH EVERY DAY 09/08/20   Wilson Singer, MD  furosemide (LASIX) 20 MG tablet Take 1 tablet (20 mg total) by mouth every Monday, Wednesday, and Friday. 02/22/23   Marykay Lex, MD  lisinopril (ZESTRIL) 2.5 MG tablet Take 1 tablet (2.5 mg total) by mouth every Tuesday, Thursday, Saturday, and Sunday. 02/23/23   Marykay Lex, MD  memantine (NAMENDA) 5 MG tablet Take 5 mg by mouth 2 (two) times daily. 12/11/21   [provider]  nitroGLYCERIN (NITROSTAT) 0.4 MG SL tablet Place 0.4 mg under the tongue every 5 (five) minutes as needed for chest pain. Patient not taking: Reported on 02/23/2023    [provider]  ondansetron (ZOFRAN-ODT) 4 MG disintegrating tablet Take 1 tablet (4 mg total) by mouth every 8 (eight) hours as needed for nausea or vomiting. 03/12/22   Particia Nearing, PA-C  pantoprazole (PROTONIX) 40 MG tablet TAKE 1 TABLET BY  MOUTH EVERY DAY 05/18/22   Carlan, Jeral Pinch, NP      Allergies    Patient has no known allergies.    Review of Systems   Review of Systems  Neurological:  Positive for weakness.  All other systems reviewed and are negative.   Physical Exam Updated Vital Signs BP 137/67 (BP Location: Left Arm)   Pulse 89   Temp (!) 100.7 F (38.2 C) (Oral)   Resp 18   Ht 5\' 6"  (1.676 m)   SpO2 94%   BMI 27.18 kg/m  Physical Exam Vitals and nursing note reviewed.   77 year old male, resting comfortably and in no acute distress. Vital signs are significant for fever. Oxygen saturation is 94%, which is normal. Head is normocephalic and atraumatic. PERRLA, EOMI. Oropharynx is clear. Neck is nontender and supple without adenopathy or JVD. Lungs are clear without rales, wheezes, or rhonchi. Chest is nontender. Heart has regular rate and rhythm without murmur. Abdomen is soft, flat, nontender. Extremities have 2+ edema. Skin is warm and dry without rash. Neurologic: Awake and alert, masklike facies, cranial nerves are intact, moves all extremities equally, cogwheel rigidity noted.  ED Results / Procedures / Treatments   Labs (all labs ordered are listed, but only abnormal results are displayed) Labs Reviewed  BASIC METABOLIC PANEL - Abnormal; Notable for the following components:      Result Value   Glucose, Bld 127 (*)  Creatinine, Ser 1.83 (*)    Calcium 8.7 (*)    GFR, Estimated 37 (*)    All other components within normal limits  CBC - Abnormal; Notable for the following components:   WBC 29.8 (*)    All other components within normal limits  CBG MONITORING, ED - Abnormal; Notable for the following components:   Glucose-Capillary 105 (*)    All other components within normal limits  URINALYSIS, ROUTINE W REFLEX MICROSCOPIC    EKG EKG Interpretation Date/Time:  Friday March 24 2023 03:05:42 EDT Ventricular Rate:  92 PR Interval:  144 QRS Duration:  64 QT  Interval:  330 QTC Calculation: 408 R Axis:   -22  Text Interpretation: Sinus rhythm with Premature atrial complexes Inferior infarct (cited on or before 22-Feb-2023) Anterior infarct (cited on or before 22-Feb-2023) Abnormal ECG When compared with ECG of 22-Feb-2023 13:42, Premature ventricular complexes are no longer Present Premature atrial complexes are now Present Confirmed by Dione Booze ([id number]) on 03/24/2023 3:12:11 AM  Radiology DG Chest Port 1 View  Result Date: 03/24/2023 CLINICAL DATA:  Sepsis.  Altered mental status.  High fall risk. EXAM: PORTABLE CHEST 1 VIEW COMPARISON:  11/07/2022 FINDINGS: Shallow inspiration. Heart size and pulmonary vascularity are normal. Peribronchial thickening with streaky perihilar opacities suggesting acute or chronic bronchitis. No focal consolidation or airspace disease. No pleural effusions. No pneumothorax. Mediastinal contours appear intact. IMPRESSION: Shallow inspiration. Bronchitic changes in the lungs. No focal consolidation. Electronically Signed   By: Burman Nieves M.D.   On: 03/24/2023 03:32    Procedures Procedures    Medications Ordered in ED Medications  acetaminophen (TYLENOL) tablet 650 mg (650 mg Oral Given 03/24/23 0981)    ED Course/ Medical Decision Making/ A&P                                 Medical Decision Making Amount and/or Complexity of Data Reviewed Labs: ordered. Radiology: ordered.  Risk OTC drugs.   Weakness with low-grade fever.  Of note, he initially did not have fever but developed fever after being in the emergency department.  At this point, there is concern for sepsis and I have initiated the evolving sepsis pathway.  I have reviewed his initial laboratory tests, and my interpretation is elevated random glucose, elevated creatinine slightly higher than in March, but level that he has been at in the past, leukocytosis consistent with known history of chronic lymphocytic leukemia.  WBC is in a range  that he has been at in the past.  I have added additional laboratory testing per evolving sepsis pathway including hepatic function panel, coagulation studies, lactic acid level.  I have ordered a chest x-ray and urinalysis.  I have reviewed his past records, and on 05/15/2019 he was admitted for pneumonia secondary to COVID, discharged on 05/28/2019.  Chest x-ray shows no evidence of pneumonia.  Have independently viewed the images, and agree with the radiologist's interpretation.  I have reviewed and the additional laboratory test, and my interpretation is normal lactic acid level, normal coagulation studies, minimal elevation of total bilirubin not felt to be clinically significant.  Urinalysis does have greater than 50 RBCs per high-power field, but no evidence of infection.  Subsequent temperature readings have been normal.  I do not see any indication for hospital admission at this point.  I am discharging him with instructions to follow-up with primary care provider, return for any new or  worsening symptoms.  Final Clinical Impression(s) / ED Diagnoses Final diagnoses:  Weakness  Renal insufficiency  CLL (chronic lymphocytic leukemia) (HCC)    Rx / DC Orders ED Discharge Orders     None         Dione Booze, MD 03/24/23 [phone]

## 2023-03-24 NOTE — Discharge Instructions (Addendum)
Here evaluate today did not show any cause for alarm, but things can change quickly.  If you have any new or concerning symptoms, please return to the emergency department.

## 2023-03-25 ENCOUNTER — Encounter (HOSPITAL_COMMUNITY): Payer: Self-pay

## 2023-03-25 ENCOUNTER — Emergency Department (HOSPITAL_COMMUNITY): Payer: Medicare HMO

## 2023-03-25 ENCOUNTER — Other Ambulatory Visit: Payer: Self-pay

## 2023-03-25 ENCOUNTER — Inpatient Hospital Stay (HOSPITAL_COMMUNITY)
Admission: EM | Admit: 2023-03-25 | Discharge: 2023-03-28 | DRG: 871 | Disposition: A | Discharge status: Home Health | Payer: Medicare HMO | Attending: Internal Medicine | Admitting: Internal Medicine

## 2023-03-25 DIAGNOSIS — Y92003 Bedroom of unspecified non-institutional (private) residence as the place of occurrence of the external cause: Secondary | ICD-10-CM

## 2023-03-25 DIAGNOSIS — I252 Old myocardial infarction: Secondary | ICD-10-CM

## 2023-03-25 DIAGNOSIS — F4310 Post-traumatic stress disorder, unspecified: Secondary | ICD-10-CM | POA: Diagnosis not present

## 2023-03-25 DIAGNOSIS — W06XXXA Fall from bed, initial encounter: Secondary | ICD-10-CM | POA: Diagnosis present

## 2023-03-25 DIAGNOSIS — N1831 Chronic kidney disease, stage 3a: Secondary | ICD-10-CM | POA: Diagnosis present

## 2023-03-25 DIAGNOSIS — I130 Hypertensive heart and chronic kidney disease with heart failure and stage 1 through stage 4 chronic kidney disease, or unspecified chronic kidney disease: Secondary | ICD-10-CM | POA: Diagnosis not present

## 2023-03-25 DIAGNOSIS — Z86718 Personal history of other venous thrombosis and embolism: Secondary | ICD-10-CM | POA: Diagnosis not present

## 2023-03-25 DIAGNOSIS — Z1152 Encounter for screening for COVID-19: Secondary | ICD-10-CM

## 2023-03-25 DIAGNOSIS — N179 Acute kidney failure, unspecified: Secondary | ICD-10-CM | POA: Diagnosis not present

## 2023-03-25 DIAGNOSIS — C9110 Chronic lymphocytic leukemia of B-cell type not having achieved remission: Secondary | ICD-10-CM | POA: Diagnosis not present

## 2023-03-25 DIAGNOSIS — C9111 Chronic lymphocytic leukemia of B-cell type in remission: Secondary | ICD-10-CM | POA: Diagnosis not present

## 2023-03-25 DIAGNOSIS — Z66 Do not resuscitate: Secondary | ICD-10-CM | POA: Diagnosis present

## 2023-03-25 DIAGNOSIS — R0989 Other specified symptoms and signs involving the circulatory and respiratory systems: Secondary | ICD-10-CM | POA: Diagnosis not present

## 2023-03-25 DIAGNOSIS — E782 Mixed hyperlipidemia: Secondary | ICD-10-CM | POA: Diagnosis present

## 2023-03-25 DIAGNOSIS — F0390 Unspecified dementia without behavioral disturbance: Secondary | ICD-10-CM | POA: Diagnosis not present

## 2023-03-25 DIAGNOSIS — K8020 Calculus of gallbladder without cholecystitis without obstruction: Secondary | ICD-10-CM | POA: Diagnosis not present

## 2023-03-25 DIAGNOSIS — T39015A Adverse effect of aspirin, initial encounter: Secondary | ICD-10-CM | POA: Diagnosis present

## 2023-03-25 DIAGNOSIS — Z833 Family history of diabetes mellitus: Secondary | ICD-10-CM

## 2023-03-25 DIAGNOSIS — J181 Lobar pneumonia, unspecified organism: Secondary | ICD-10-CM | POA: Diagnosis not present

## 2023-03-25 DIAGNOSIS — R7881 Bacteremia: Secondary | ICD-10-CM | POA: Diagnosis not present

## 2023-03-25 DIAGNOSIS — M791 Myalgia, unspecified site: Secondary | ICD-10-CM | POA: Diagnosis present

## 2023-03-25 DIAGNOSIS — Z7902 Long term (current) use of antithrombotics/antiplatelets: Secondary | ICD-10-CM

## 2023-03-25 DIAGNOSIS — I5042 Chronic combined systolic (congestive) and diastolic (congestive) heart failure: Secondary | ICD-10-CM | POA: Diagnosis present

## 2023-03-25 DIAGNOSIS — K219 Gastro-esophageal reflux disease without esophagitis: Secondary | ICD-10-CM | POA: Diagnosis not present

## 2023-03-25 DIAGNOSIS — Z860100 Personal history of colon polyps, unspecified: Secondary | ICD-10-CM

## 2023-03-25 DIAGNOSIS — R338 Other retention of urine: Secondary | ICD-10-CM | POA: Diagnosis present

## 2023-03-25 DIAGNOSIS — F32A Depression, unspecified: Secondary | ICD-10-CM | POA: Diagnosis present

## 2023-03-25 DIAGNOSIS — I255 Ischemic cardiomyopathy: Secondary | ICD-10-CM | POA: Diagnosis present

## 2023-03-25 DIAGNOSIS — Z87891 Personal history of nicotine dependence: Secondary | ICD-10-CM

## 2023-03-25 DIAGNOSIS — R6510 Systemic inflammatory response syndrome (SIRS) of non-infectious origin without acute organ dysfunction: Secondary | ICD-10-CM | POA: Diagnosis not present

## 2023-03-25 DIAGNOSIS — J189 Pneumonia, unspecified organism: Secondary | ICD-10-CM | POA: Diagnosis not present

## 2023-03-25 DIAGNOSIS — R519 Headache, unspecified: Secondary | ICD-10-CM | POA: Diagnosis present

## 2023-03-25 DIAGNOSIS — I2510 Atherosclerotic heart disease of native coronary artery without angina pectoris: Secondary | ICD-10-CM | POA: Diagnosis present

## 2023-03-25 DIAGNOSIS — N401 Enlarged prostate with lower urinary tract symptoms: Secondary | ICD-10-CM | POA: Diagnosis present

## 2023-03-25 DIAGNOSIS — Z856 Personal history of leukemia: Secondary | ICD-10-CM | POA: Diagnosis not present

## 2023-03-25 DIAGNOSIS — R6520 Severe sepsis without septic shock: Secondary | ICD-10-CM

## 2023-03-25 DIAGNOSIS — A419 Sepsis, unspecified organism: Secondary | ICD-10-CM | POA: Diagnosis not present

## 2023-03-25 DIAGNOSIS — K5730 Diverticulosis of large intestine without perforation or abscess without bleeding: Secondary | ICD-10-CM | POA: Diagnosis present

## 2023-03-25 DIAGNOSIS — N3289 Other specified disorders of bladder: Secondary | ICD-10-CM | POA: Diagnosis present

## 2023-03-25 DIAGNOSIS — Z955 Presence of coronary angioplasty implant and graft: Secondary | ICD-10-CM | POA: Diagnosis not present

## 2023-03-25 DIAGNOSIS — N281 Cyst of kidney, acquired: Secondary | ICD-10-CM | POA: Diagnosis not present

## 2023-03-25 DIAGNOSIS — R590 Localized enlarged lymph nodes: Secondary | ICD-10-CM | POA: Diagnosis not present

## 2023-03-25 DIAGNOSIS — I5022 Chronic systolic (congestive) heart failure: Secondary | ICD-10-CM | POA: Insufficient documentation

## 2023-03-25 DIAGNOSIS — R918 Other nonspecific abnormal finding of lung field: Secondary | ICD-10-CM | POA: Diagnosis not present

## 2023-03-25 DIAGNOSIS — Z79899 Other long term (current) drug therapy: Secondary | ICD-10-CM

## 2023-03-25 LAB — CBC WITH DIFFERENTIAL/PLATELET
Abs Immature Granulocytes: 0 10*3/uL (ref 0.00–0.07)
Basophils Absolute: 0 10*3/uL (ref 0.0–0.1)
Basophils Relative: 0 %
Eosinophils Absolute: 0 10*3/uL (ref 0.0–0.5)
Eosinophils Relative: 0 %
HCT: 46.6 % (ref 39.0–52.0)
Hemoglobin: 14.7 g/dL (ref 13.0–17.0)
Lymphocytes Relative: 78 %
Lymphs Abs: 22.6 10*3/uL — ABNORMAL HIGH (ref 0.7–4.0)
MCH: 30.8 pg (ref 26.0–34.0)
MCHC: 31.5 g/dL (ref 30.0–36.0)
MCV: 97.5 fL (ref 80.0–100.0)
Monocytes Absolute: 0.6 10*3/uL (ref 0.1–1.0)
Monocytes Relative: 2 %
Neutro Abs: 5.8 10*3/uL (ref 1.7–7.7)
Neutrophils Relative %: 20 %
Platelets: 170 10*3/uL (ref 150–400)
RBC: 4.78 MIL/uL (ref 4.22–5.81)
RDW: 14.8 % (ref 11.5–15.5)
WBC: 29 10*3/uL — ABNORMAL HIGH (ref 4.0–10.5)
nRBC: 0.1 % (ref 0.0–0.2)

## 2023-03-25 LAB — PROTIME-INR
INR: 1.2 (ref 0.8–1.2)
Prothrombin Time: 15 s (ref 11.4–15.2)

## 2023-03-25 LAB — HEPATIC FUNCTION PANEL
ALT: 16 U/L (ref 0–44)
AST: 23 U/L (ref 15–41)
Albumin: 3.9 g/dL (ref 3.5–5.0)
Alkaline Phosphatase: 73 U/L (ref 38–126)
Bilirubin, Direct: 0.4 mg/dL — ABNORMAL HIGH (ref 0.0–0.2)
Indirect Bilirubin: 1.8 mg/dL — ABNORMAL HIGH (ref 0.3–0.9)
Total Bilirubin: 2.2 mg/dL — ABNORMAL HIGH (ref 0.3–1.2)
Total Protein: 7 g/dL (ref 6.5–8.1)

## 2023-03-25 LAB — BLOOD CULTURE ID PANEL (REFLEXED) - BCID2

## 2023-03-25 LAB — URINALYSIS, ROUTINE W REFLEX MICROSCOPIC
Bilirubin Urine: NEGATIVE
Glucose, UA: NEGATIVE mg/dL
Ketones, ur: NEGATIVE mg/dL
Leukocytes,Ua: NEGATIVE
Nitrite: NEGATIVE
Protein, ur: 30 mg/dL — AB
Specific Gravity, Urine: 1.011 (ref 1.005–1.030)
pH: 7 (ref 5.0–8.0)

## 2023-03-25 LAB — APTT: aPTT: 32 s (ref 24–36)

## 2023-03-25 LAB — BASIC METABOLIC PANEL
Anion gap: 9 (ref 5–15)
BUN: 15 mg/dL (ref 8–23)
CO2: 22 mmol/L (ref 22–32)
Calcium: 8.3 mg/dL — ABNORMAL LOW (ref 8.9–10.3)
Chloride: 101 mmol/L (ref 98–111)
Creatinine, Ser: 1.84 mg/dL — ABNORMAL HIGH (ref 0.61–1.24)
GFR, Estimated: 37 mL/min — ABNORMAL LOW (ref >60)
Glucose, Bld: 118 mg/dL — ABNORMAL HIGH (ref 70–99)
Potassium: 4.3 mmol/L (ref 3.5–5.1)
Sodium: 132 mmol/L — ABNORMAL LOW (ref 135–145)

## 2023-03-25 LAB — MRSA NEXT GEN BY PCR, NASAL: MRSA by PCR Next Gen: NOT DETECTED

## 2023-03-25 LAB — LACTIC ACID, PLASMA
Lactic Acid, Venous: 1.4 mmol/L (ref 0.5–1.9)
Lactic Acid, Venous: 1.8 mmol/L (ref 0.5–1.9)

## 2023-03-25 MED ORDER — LACTATED RINGERS IV SOLN
150.0000 mL/h | INTRAVENOUS | Status: DC
  Administered 2023-03-25 – 2023-03-26 (×2): 150 mL/h via INTRAVENOUS

## 2023-03-25 MED ORDER — ONDANSETRON HCL 4 MG PO TABS: 4.0000 mg | ORAL_TABLET | Freq: Four times a day (QID) | ORAL | Status: DC | PRN

## 2023-03-25 MED ORDER — ENOXAPARIN SODIUM 30 MG/0.3ML IJ SOSY
30.0000 mg | PREFILLED_SYRINGE | INTRAMUSCULAR | Status: DC
  Administered 2023-03-25: 30 mg via SUBCUTANEOUS
  Filled 2023-03-25: qty 0.3

## 2023-03-25 MED ORDER — SODIUM CHLORIDE 0.9 % IV SOLN
500.0000 mg | INTRAVENOUS | Status: DC
  Administered 2023-03-25 – 2023-03-27 (×3): 500 mg via INTRAVENOUS
  Filled 2023-03-25 (×3): qty 5

## 2023-03-25 MED ORDER — CHLORHEXIDINE GLUCONATE CLOTH 2 % EX PADS
6.0000 | MEDICATED_PAD | Freq: Every day | CUTANEOUS | Status: DC
  Administered 2023-03-25 – 2023-03-26 (×2): 6 via TOPICAL

## 2023-03-25 MED ORDER — SENNOSIDES-DOCUSATE SODIUM 8.6-50 MG PO TABS: 1.0000 | ORAL_TABLET | Freq: Every evening | ORAL | Status: DC | PRN

## 2023-03-25 MED ORDER — SODIUM CHLORIDE 0.9 % IV BOLUS (SEPSIS)
1000.0000 mL | Freq: Once | INTRAVENOUS | Status: AC
  Administered 2023-03-25: 1000 mL via INTRAVENOUS

## 2023-03-25 MED ORDER — ONDANSETRON HCL 4 MG/2ML IJ SOLN: 4.0000 mg | Freq: Four times a day (QID) | INTRAMUSCULAR | Status: DC | PRN

## 2023-03-25 MED ORDER — SODIUM CHLORIDE 0.9 % IV SOLN
2.0000 g | INTRAVENOUS | Status: DC
  Administered 2023-03-25 – 2023-03-27 (×3): 2 g via INTRAVENOUS
  Filled 2023-03-25 (×3): qty 20

## 2023-03-25 MED ORDER — ACETAMINOPHEN 325 MG PO TABS: 650.0000 mg | ORAL_TABLET | Freq: Four times a day (QID) | ORAL | Status: DC | PRN

## 2023-03-25 MED ORDER — ACETAMINOPHEN 650 MG RE SUPP: 650.0000 mg | Freq: Four times a day (QID) | RECTAL | Status: DC | PRN

## 2023-03-25 NOTE — ED Notes (Signed)
Unable to get IV access. Charge nurse will try ultrasound IV.

## 2023-03-25 NOTE — ED Provider Notes (Signed)
Care was discussed with the hospitalist, Dr. Clabe Seal, he has been kind enough to admit   [name] Hong, MD 03/25/23 [phone]

## 2023-03-25 NOTE — Plan of Care (Signed)

## 2023-03-25 NOTE — ED Notes (Signed)
0830hrs ,03/25/2023  Critical result  Aerobic blood cultures = gram positive cocci  EDP aware.

## 2023-03-25 NOTE — ED Triage Notes (Signed)
Pt brought in from home after receiving a phone call for positive blood cultures. Pt was seen Thursday night for urinary retention and a foley catheter placed.

## 2023-03-25 NOTE — ED Notes (Signed)
1530  [facility] lab called and his cultures are staphylococcus species and no resistance detected. EDP aware.

## 2023-03-25 NOTE — ED Notes (Signed)
CN called pt and spoke with his wife due to dementia and informed him of his lab work. Family notified the need for pt to return to ED. Wife said she would have to call EMS d/t no car. 1145hrs , 03/25/2023

## 2023-03-25 NOTE — ED Provider Notes (Signed)
[facility] AT [facility] Provider Note  CSN: [id number] Arrival date & time: 03/25/23 1220  Chief Complaint(s) Abnormal Labs  HPI [name] is a 77 y.o. male with past medical history as below, significant for CAD, CKD, CLL, CHF, dementia, GERD, HLD, IDA who presents to the ED with complaint of + blood culture  Patient was seen in the ED yesterday, discharged in stable condition.  Received call back today because he had positive blood culture.  Patient reports ongoing weakness, coughing, difficulty with urination, dysuria.  Tmax 103 at home.  Chills, body aches.  Larey Seat this morning getting out of bed, slipped to the ground fell onto his bottom.  No head injury or LOC.  Here w/ Spouse who is primary caretaker  Oncology Dr Lorelle Formosa, no current chemo, in remission  Past Medical History Past Medical History:  Diagnosis Date   Acute MI, inferolateral wall, initial episode of care (HCC) 06/16/2015   99% dCx    Acute respiratory failure with hypoxia (HCC) 05/22/2019   BPH (benign prostatic hyperplasia) 04/08/2011   CAD S/P percutaneous coronary angioplasty 11/23/12; 06/16/15   a. NSTEMI 2014 with 2 overlapping mRCA lesions - Xience Xpedition DES 2.75 mm x 18 mm (3.0 mm). b. Inf-Lat STEMI 06/16/2015 - dCx Asp Thrombectomy & PCI 3.0 x 15 Xience drDES (3.6 mm).   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     Stable.  No melena, hematochezia or hematuria.     Ischemic cardiomyopathy    Echo 06/18/2015 EF 35-40% after lateral MI -> repeat echo 08/06/2015: EF 40-45% with basal-mid inferolateral akinesis and hypokinesis of the lateral wall.   Memory loss 07/09/2019   Near syncope 11/08/2022   NSTEMI (non-ST elevated myocardial infarction) (HCC) 11/23/2012   NSTEMI 11/23/2012 RCA lesion - PCI with DES; moderate LAD disease   PTSD (post-traumatic stress disorder)    With Depression - since MI   Recurrent coronary arteriosclerosis after percutaneous transluminal coronary angioplasty 11/24/2012   PCI of mid RCA -- Xience eXp 2.75 mm x 18 mm (post-dil to 3.1 mm) 2014  PCI of CFX with DES 06/16/15     Last Assessment & Plan:    DES stents in the RCA and LCx.  No recurrent angina symptoms.     Plan: Continue monotherapy with Plavix.   Okay to hold Plavix 5 days preop for surgeries or procedures.     Patient Active Problem List   Diagnosis Date Noted  Dementia (HCC)    Hardening of the aorta (main artery of the heart) (HCC) 12/21/2022   Near syncope 11/08/2022   Impaired fasting glucose 02/17/2022   Atherosclerosis of coronary artery without angina pectoris 11/17/2021   Chronic kidney disease, stage 3b (HCC) 03/01/2021   Depressive disorder 03/01/2021   Congestive heart failure (HCC) 03/01/2021   Sundowning 03/01/2021   Coronary artery disease involving native coronary artery of native heart without angina pectoris 08/02/2020   Iron deficiency anemia due to chronic blood loss 07/22/2019   Iron deficiency  anemia secondary to blood loss (chronic) 07/22/2019   Normocytic anemia 07/16/2019   Weakness 07/09/2019   Memory loss 07/09/2019   Rash 07/09/2019   Gastrointestinal hemorrhage associated with intestinal diverticulosis    Acute respiratory failure with hypoxia (HCC) 05/22/2019   Sepsis (HCC) 05/15/2019   Community acquired pneumonia 05/15/2019   Pneumonia due to COVID-19 virus 05/15/2019   COVID-19 05/15/2019   Abdominal pain 03/06/2018   Constipation 03/06/2018   Gastroesophageal reflux disease 03/08/2016   Dysphagia 03/08/2016   Chronic cough 03/08/2016   Ischemic cardiomyopathy    STEMI - 06/16/15 06/16/2015   Essential hypertension 06/16/2015   Myocardial infarction of inferolateral wall (HCC) 06/16/2015   Cholelithiasis without cholecystitis 12/18/2013   Hematochezia 06/04/2013   Lower leg edema 03/14/2013   PTSD (post-traumatic stress disorder)    CAD S/P PCI-- RCA DES 2014,  CFX DES Jan 2017 11/24/2012   Recurrent coronary arteriosclerosis after percutaneous transluminal coronary angioplasty 11/24/2012   h/o Non-STEMI (non-ST elevated myocardial infarction) (HCC) 11/23/2012   Hyperglycemia 11/23/2012   History of smoking 11/23/2012   Dyslipidemia, goal LDL below 70 - on atorvastatin; monitored by PCP 11/23/2012   Diverticulitis of colon without hemorrhage- on antibiotics for recent flair up 09/19/2012   Hx of adenomatous colonic polyps 09/19/2012   CLL (chronic lymphocytic leukemia) (HCC) 04/08/2011   BPH (benign prostatic hyperplasia) 04/08/2011   Chronic lymphoid leukemia (HCC) 04/08/2011   Home Medication(s) Prior to Admission medications   Medication Sig Start Date End Date Taking? Authorizing Provider  atorvastatin (LIPITOR) 40 MG tablet Take 1 tablet (40 mg total) by mouth daily. 11/19/20 03/03/22  Elenore Paddy, NP  clopidogrel (PLAVIX) 75 MG tablet TAKE 1 TABLET BY MOUTH EVERY DAY 11/05/20   Wilson Singer, MD  [employer] VITAMIN B12 1000 MCG TBCR TAKE 1 TABLET BY  MOUTH EVERY DAY 09/08/20   Wilson Singer, MD  furosemide (LASIX) 20 MG tablet Take 1 tablet (20 mg total) by mouth every Monday, Wednesday, and Friday. 02/22/23   Marykay Lex, MD  lisinopril (ZESTRIL) 2.5 MG tablet Take 1 tablet (2.5 mg total) by mouth every Tuesday, Thursday, Saturday, and Sunday. 02/23/23   Marykay Lex, MD  memantine (NAMENDA) 5 MG tablet Take 5 mg by mouth 2 (two) times daily. 12/11/21   [provider]  nitroGLYCERIN (NITROSTAT) 0.4 MG SL tablet Place 0.4 mg under the tongue every 5 (five) minutes as needed for chest pain. Patient not taking: Reported on 02/23/2023    [provider]  ondansetron (ZOFRAN-ODT) 4 MG disintegrating tablet Take 1 tablet (4 mg total) by mouth every 8 (eight) hours as needed for nausea or vomiting. 03/12/22   Particia Nearing, PA-C  pantoprazole (PROTONIX) 40 MG tablet TAKE 1 TABLET BY MOUTH EVERY DAY 05/18/22   Raquel Alexie, NP  Past Surgical History Past Surgical History:  Procedure Laterality Date   APPENDECTOMY  1968-70   CARDIAC CATHETERIZATION N/A 06/16/2015   Procedure: Left Heart Cath and Coronary Angiography;  Surgeon: Corky Crafts, MD;  Location: [facility] INVASIVE CV LAB;  Service: Cardiovascular: 99% thrombotic dCx -> asp thrombectomy & PCI. RCA Stents Patent.    CARDIAC CATHETERIZATION N/A 06/16/2015   Procedure: Coronary Stent Intervention;  Surgeon: Corky Crafts, MD;  Location: [facility] INVASIVE CV LAB;  Service: Cardiovascular: PCI dCx = thrombectomy -> 3.0 x 15 Xience drug-eluting stent (3.6 mm)    COLONOSCOPY  2008   [id number] diverticula, diminutive polyp in the cecum, s/p bx Normal rectum. Tubular adenoma   COLONOSCOPY N/A 10/17/2012   Dr. Jena Gauss: colonic diverticulosis, tubular adenoma, surveillance due May 2019   COLONOSCOPY N/A 02/21/2018   Procedure:  COLONOSCOPY;  Surgeon: Corbin Ade, MD; Diverticulosis of the sigmoid and descending colon, otherwise normal exam.  No recommendations to repeat.   CORONARY STENT PLACEMENT  11/23/2012   Mid RCA -- Xience eXp - 2.75 mm x 18 mm DES (3.63mm) , Dr. Herbie Baltimore   ESOPHAGOGASTRODUODENOSCOPY N/A 03/03/2021   Procedure: ESOPHAGOGASTRODUODENOSCOPY (EGD);  Surgeon: Corbin Ade, MD;  Location: [facility];  Service: Endoscopy;  Laterality: N/A;  12:00pm   IR IVC FILTER PLMT / S&I /IMG GUID/MOD SED  05/27/2019   IR RADIOLOGIST EVAL & MGMT  09/11/2019   LEFT HEART CATHETERIZATION WITH CORONARY ANGIOGRAM N/A 11/23/2012   Procedure: LEFT HEART CATHETERIZATION WITH CORONARY ANGIOGRAM;  Surgeon: Marykay Lex, MD;  Location: [facility] CATH LAB;  Service: Cardiovascular: NSTEMI: Culprit = mid RCA 95% & 75%; LAD ~40-50%, Small RI ~60%; EF ~60%, mild inf-basal HK      MALONEY DILATION N/A 03/03/2021   Procedure: MALONEY DILATION;  Surgeon: Corbin Ade, MD;  Location: [facility];  Service: Endoscopy;  Laterality: N/A;   TRANSTHORACIC ECHOCARDIOGRAM  January 2017; March 2017   a. EF 35-40%. Entire inferior hypokinesis and akinesis of the basal and mid inferolateral and anterolateral /distal lateral walls;; b. EF 40 and 45%. Akinesis of basal-mid inferolateral wall. Hypokinesis of entire lateral wall.   Family History Family History  Problem Relation Age of Onset   Diabetes Mother    Cancer Father        prostate   Colon cancer Neg Hx    Esophageal cancer Neg Hx    Stomach cancer Neg Hx     Social History Social History   Tobacco Use   Smoking status: Former    Current packs/day: 0.00    Average packs/day: 0.3 packs/day for 2.0 years (0.5 ttl pk-yrs)    Types: Cigarettes    Start date: 06/06/1961    Quit date: 06/07/1963    Years since quitting: 59.8   Smokeless tobacco: Never  Vaping Use   Vaping status: Never Used  Substance Use Topics   Alcohol use: No    Alcohol/week: 0.0 standard drinks of  alcohol    Comment: Occasionally drinks red wine   Drug use: No   Allergies Patient has no known allergies.  Review of Systems Review of Systems  Unable to perform ROS: Dementia  Constitutional:  Positive for chills, fatigue and fever.  Respiratory:  Positive for cough and shortness of breath.   Cardiovascular:  Negative for chest pain.  Gastrointestinal:  Negative for abdominal pain.  Genitourinary:  Positive for dysuria.  Musculoskeletal:  Positive for arthralgias.    Physical Exam Vital Signs  I  have reviewed the triage vital signs BP (!) 156/74 (BP Location: Right Arm)   Pulse 85   Temp 100 F (37.8 C) (Oral)   Resp (!) 21   Ht 5\' 6"  (1.676 m)   Wt 76.2 kg   SpO2 96%   BMI 27.11 kg/m  Physical Exam Vitals and nursing note reviewed.  Constitutional:      General: He is not in acute distress.    Appearance: He is well-developed.  HENT:     Head: Normocephalic and atraumatic.     Right Ear: External ear normal.     Left Ear: External ear normal.     Mouth/Throat:     Mouth: Mucous membranes are moist.  Eyes:     General: No scleral icterus. Cardiovascular:     Rate and Rhythm: Normal rate and regular rhythm.     Pulses: Normal pulses.     Heart sounds: Normal heart sounds.  Pulmonary:     Effort: Pulmonary effort is normal. Tachypnea present. No respiratory distress.     Breath sounds: Normal breath sounds.  Abdominal:     General: Abdomen is flat.     Palpations: Abdomen is soft.     Tenderness: There is no abdominal tenderness.  Musculoskeletal:     Cervical back: No rigidity.     Right lower leg: No edema.     Left lower leg: No edema.  Skin:    General: Skin is warm and dry.     Capillary Refill: Capillary refill takes less than 2 seconds.  Neurological:     Mental Status: He is alert.  Psychiatric:        Mood and Affect: Mood normal.        Behavior: Behavior normal.     ED Results and Treatments Labs (all labs ordered are listed, but  only abnormal results are displayed) Labs Reviewed  CBC WITH DIFFERENTIAL/PLATELET - Abnormal; Notable for the following components:      Result Value   WBC 29.0 (*)    Lymphs Abs 22.6 (*)    All other components within normal limits  BASIC METABOLIC PANEL - Abnormal; Notable for the following components:   Sodium 132 (*)    Glucose, Bld 118 (*)    Creatinine, Ser 1.84 (*)    Calcium 8.3 (*)    GFR, Estimated 37 (*)    All other components within normal limits  CULTURE, BLOOD (ROUTINE X 2)  CULTURE, BLOOD (ROUTINE X 2)  EXPECTORATED SPUTUM ASSESSMENT W GRAM STAIN, RFLX TO RESP C  LACTIC ACID, PLASMA  LACTIC ACID, PLASMA  URINALYSIS, ROUTINE W REFLEX MICROSCOPIC  PROTIME-INR  APTT  HEPATIC FUNCTION PANEL  PROTIME-INR  APTT                                                                                                                          Radiology DG Chest Portable 1 View  Result Date: 03/25/2023 CLINICAL DATA:  Bacteremia EXAM:  PORTABLE CHEST 1 VIEW COMPARISON:  Chest x-ray dated March 24, 2023 FINDINGS: Cardiac and mediastinal contours are unchanged. Low lung volumes with hypoventilatory changes. Left retrocardiac opacity. No evidence of pleural effusion or pneumothorax. IMPRESSION: Left retrocardiac opacity, could be due to atelectasis, infection or aspiration. Electronically Signed   By: Allegra Lai M.D.   On: 03/25/2023 15:40    Pertinent labs & imaging results that were available during my care of the patient were reviewed by me and considered in my medical decision making (see MDM for details).  Medications Ordered in ED Medications  sodium chloride 0.9 % bolus 1,000 mL (has no administration in time range)  cefTRIAXone (ROCEPHIN) 2 g in sodium chloride 0.9 % 100 mL IVPB (has no administration in time range)  azithromycin (ZITHROMAX) 500 mg in sodium chloride 0.9 % 250 mL IVPB (has no administration in time range)                                                                                                                                      Procedures .Critical Care  Performed by: Sloan Leiter, DO Authorized by: Sloan Leiter, DO   Critical care provider statement:    Critical care time (minutes):  44   Critical care time was exclusive of:  Separately billable procedures and treating other patients   Critical care was necessary to treat or prevent imminent or life-threatening deterioration of the following conditions:  Sepsis   Critical care was time spent personally by me on the following activities:  Development of treatment plan with patient or surrogate, discussions with consultants, evaluation of patient's response to treatment, examination of patient, ordering and review of laboratory studies, ordering and review of radiographic studies, ordering and performing treatments and interventions, pulse oximetry, re-evaluation of patient's condition, review of old charts and obtaining history from patient or surrogate   (including critical care time)  Medical Decision Making / ED Course    Medical Decision Making:    [name] is a 77 y.o. male  with past medical history as below, significant for CAD, CKD, CLL, CHF, dementia, GERD, HLD, IDA who presents to the ED with complaint of + blood culture. The complaint involves an extensive differential diagnosis and also carries with it a high risk of complications and morbidity.  Serious etiology was considered. Ddx includes but is not limited to: Differential diagnosis for adult fever includes but is not exclusive to community-acquired pneumonia, urinary tract infection, acute cholecystitis, viral syndrome, cellulitis, tick bourne disease,  decubitus ulcer, necrotizing fasciitis, meningitis, encephalitis, influenza, etc.   Complete initial physical exam performed, notably the patient  was no acute distress, breathing comfortably ambient air.  Low-grade fever of 100, tmax 103 this am per  spouse.    Reviewed and confirmed nursing documentation for past medical history, family history, social history.  Vital signs reviewed.    Clinical Course as  of 03/25/23 1641  Sat Mar 25, 2023  1425 Blood culture yestd with gram positive cocci in one bottle  [SG]  1520 Creatinine(!): 1.84 Similar to yestd but worsened from 4 months ago (1.4) [SG]    Clinical Course User Index [SG] Sloan Leiter, DO     Patient with positive blood culture from recent visit Notable AKI compared to prior labs WBC 29, similar to prior (Hx CLL) Pneumonia noted on chest x-ray. Bacteremia sepsis Admit for bacteremia, pneumonia AKI; patient and family agreeable.  Pending callback from hospitalist                  Additional history obtained: -Additional history obtained from spouse -External records from outside source obtained and reviewed including: Chart review including previous notes, labs, imaging, consultation notes including  Recent ed visit Recent blood culture Recent labs   Lab Tests: -I ordered, reviewed, and interpreted labs.   The pertinent results include:   Labs Reviewed  CBC WITH DIFFERENTIAL/PLATELET - Abnormal; Notable for the following components:      Result Value   WBC 29.0 (*)    Lymphs Abs 22.6 (*)    All other components within normal limits  BASIC METABOLIC PANEL - Abnormal; Notable for the following components:   Sodium 132 (*)    Glucose, Bld 118 (*)    Creatinine, Ser 1.84 (*)    Calcium 8.3 (*)    GFR, Estimated 37 (*)    All other components within normal limits  CULTURE, BLOOD (ROUTINE X 2)  CULTURE, BLOOD (ROUTINE X 2)  EXPECTORATED SPUTUM ASSESSMENT W GRAM STAIN, RFLX TO RESP C  LACTIC ACID, PLASMA  LACTIC ACID, PLASMA  URINALYSIS, ROUTINE W REFLEX MICROSCOPIC  PROTIME-INR  APTT  HEPATIC FUNCTION PANEL  PROTIME-INR  APTT    Notable for leukocytosis, no neutropenia  EKG   EKG Interpretation Date/Time:    Ventricular Rate:     PR Interval:    QRS Duration:    QT Interval:    QTC Calculation:   R Axis:      Text Interpretation:           Imaging Studies ordered: I ordered imaging studies including chest x-ray I independently visualized the following imaging with scope of interpretation limited to determining acute life threatening conditions related to emergency care; findings noted above I independently visualized and interpreted imaging. I agree with the radiologist interpretation   Medicines ordered and prescription drug management: Meds ordered this encounter  Medications   sodium chloride 0.9 % bolus 1,000 mL   cefTRIAXone (ROCEPHIN) 2 g in sodium chloride 0.9 % 100 mL IVPB    Order Specific Question:   Antibiotic Indication:    Answer:   CAP   azithromycin (ZITHROMAX) 500 mg in sodium chloride 0.9 % 250 mL IVPB    Order Specific Question:   Antibiotic Indication:    Answer:   CAP    -I have reviewed the patients home medicines and have made adjustments as needed   Consultations Obtained: na   Cardiac Monitoring: The patient was maintained on a cardiac monitor.  I personally viewed and interpreted the cardiac monitored which showed an underlying rhythm of: NSR  Social Determinants of Health:  Diagnosis or treatment significantly limited by social determinants of health: former smoker lives at home   Reevaluation: After the interventions noted above, I reevaluated the patient and found that they have stayed the same  Co morbidities that complicate the patient evaluation  Past Medical History:  Diagnosis Date   Acute MI, inferolateral wall, initial episode of care (HCC) 06/16/2015   99% dCx    Acute respiratory failure with hypoxia (HCC) 05/22/2019   BPH (benign prostatic hyperplasia) 04/08/2011   CAD S/P percutaneous coronary angioplasty 11/23/12; 06/16/15   a. NSTEMI 2014 with 2 overlapping mRCA lesions - Xience Xpedition DES 2.75 mm x 18 mm (3.0 mm). b. Inf-Lat STEMI 06/16/2015  - dCx Asp Thrombectomy & PCI 3.0 x 15 Xience drDES (3.6 mm).   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     Stable.  No melena, hematochezia or hematuria.     Ischemic cardiomyopathy    Echo 06/18/2015 EF 35-40% after lateral MI -> repeat echo 08/06/2015: EF 40-45% with basal-mid inferolateral akinesis and hypokinesis of the lateral wall.   Memory loss 07/09/2019   Near syncope 11/08/2022   NSTEMI (non-ST elevated myocardial infarction) (HCC) 11/23/2012   NSTEMI 11/23/2012 RCA lesion - PCI with DES; moderate LAD disease   PTSD (post-traumatic stress disorder)    With Depression - since MI   Recurrent coronary arteriosclerosis after percutaneous transluminal coronary angioplasty 11/24/2012   PCI of mid RCA -- Xience eXp 2.75 mm x 18 mm (post-dil to 3.1 mm) 2014  PCI of CFX with DES 06/16/15     Last Assessment & Plan:    DES stents in the RCA and LCx.  No recurrent angina symptoms.     Plan: Continue monotherapy with Plavix.   Okay to hold Plavix  5 days preop for surgeries or procedures.        Dispostion: Disposition decision including need for hospitalization was considered, and patient disposition pending at time of sign out.    Final Clinical Impression(s) / ED Diagnoses Final diagnoses:  Community acquired pneumonia, unspecified laterality  Bacteremia  Personal history of CLL (chronic lymphocytic leukemia)        Sloan Leiter, DO 03/25/23 1641

## 2023-03-25 NOTE — H&P (Signed)
History and Physical    Patient: [name] WUJ:[id number] DOB: 11/29/1945 DOA: 03/25/2023 DOS: the patient was seen and examined on 03/25/2023 PCP: Benita Stabile, MD  Patient coming from: Home  Chief Complaint:  Chief Complaint  Patient presents with   Abnormal Labs   HPI: [name] is a 77 y.o. male with medical history significant of CAD, CKD3, CLL (in remission), CHF, dementia, GERD, HLD, IDA who was advised to present to the ED due to a positive blood culture. Patient has dementia and not completely oriented to situation so history was obtained from spouse. Per spouse, patient presented to the ED yesterday due to generalized weakness and subjective fevers and chills. Patient was discharged home from the ED due to relatively normal labs at that time. His blood cultures came back positive for staph species and was informed to return to the ED.  Per spouse, patient continued to feels weak and fell out of his bed in the morning but did not hit his head or lose consciousness. He also had a Tmax of 103 at home and has been coughing.  ED course: Vitals show low-grade fever of 100, tachypneic to 21 and hypertensive with SBP in the 150s. Labs show creatinine 1.84, unchanged from yesterday.  CBC with WBC of 29, at baseline. UA showed moderate Hgb but no signs of UTI.  Normal lactic acid and PT/INR. Repeat blood cultures pending. CXR shows possible opacity in the left retrocardiac space. Sepsis protocol initiated in the ED and hospitalist consulted for admission.  Review of Systems: As mentioned in the history of present illness. All other systems reviewed and are negative. Past Medical History:  Diagnosis Date   Acute MI, inferolateral wall, initial episode of care (HCC) 06/16/2015   99% dCx    Acute respiratory failure with hypoxia (HCC) 05/22/2019   BPH (benign prostatic hyperplasia) 04/08/2011   CAD S/P percutaneous coronary angioplasty 11/23/12; 06/16/15   a. NSTEMI 2014 with 2  overlapping mRCA lesions - Xience Xpedition DES 2.75 mm x 18 mm (3.0 mm). b. Inf-Lat STEMI 06/16/2015 - dCx Asp Thrombectomy & PCI 3.0 x 15 Xience drDES (3.6 mm).   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     Stable.  No melena, hematochezia or hematuria.     Ischemic cardiomyopathy    Echo 06/18/2015 EF 35-40% after lateral MI -> repeat echo 08/06/2015: EF 40-45% with basal-mid inferolateral akinesis and hypokinesis of the lateral wall.   Memory loss 07/09/2019  Near syncope 11/08/2022   NSTEMI (non-ST elevated myocardial infarction) (HCC) 11/23/2012   NSTEMI 11/23/2012 RCA lesion - PCI with DES; moderate LAD disease   PTSD (post-traumatic stress disorder)    With Depression - since MI   Recurrent coronary arteriosclerosis after percutaneous transluminal coronary angioplasty 11/24/2012   PCI of mid RCA -- Xience eXp 2.75 mm x 18 mm (post-dil to 3.1 mm) 2014  PCI of CFX with DES 06/16/15     Last Assessment & Plan:    DES stents in the RCA and  LCx.  No recurrent angina symptoms.     Plan: Continue monotherapy with Plavix.   Okay to hold Plavix 5 days preop for surgeries or procedures.     Past Surgical History:  Procedure Laterality Date   APPENDECTOMY  1968-70   CARDIAC CATHETERIZATION N/A 06/16/2015   Procedure: Left Heart Cath and Coronary Angiography;  Surgeon: Corky Crafts, MD;  Location: [facility] INVASIVE CV LAB;  Service: Cardiovascular: 99% thrombotic dCx -> asp thrombectomy & PCI. RCA Stents Patent.    CARDIAC CATHETERIZATION N/A 06/16/2015   Procedure: Coronary Stent Intervention;  Surgeon: Corky Crafts, MD;  Location: [facility] INVASIVE CV LAB;  Service: Cardiovascular: PCI dCx = thrombectomy -> 3.0 x 15 Xience drug-eluting stent (3.6 mm)    COLONOSCOPY  2008   WUJ:[id number] diverticula, diminutive polyp in the cecum, s/p bx Normal rectum. Tubular adenoma   COLONOSCOPY N/A 10/17/2012   Dr. Jena Gauss: colonic diverticulosis, tubular adenoma, surveillance due May 2019   COLONOSCOPY N/A 02/21/2018   Procedure: COLONOSCOPY;  Surgeon: Corbin Ade, MD; Diverticulosis of the sigmoid and descending colon, otherwise normal exam.  No recommendations to repeat.   CORONARY STENT PLACEMENT  11/23/2012   Mid RCA -- Xience eXp - 2.75 mm x 18 mm DES (3.27mm) , Dr. Herbie Baltimore   ESOPHAGOGASTRODUODENOSCOPY N/A 03/03/2021   Procedure: ESOPHAGOGASTRODUODENOSCOPY (EGD);  Surgeon: Corbin Ade, MD;  Location: [facility];  Service: Endoscopy;  Laterality: N/A;  12:00pm   IR IVC FILTER PLMT / S&I /IMG GUID/MOD SED  05/27/2019   IR RADIOLOGIST EVAL & MGMT  09/11/2019   LEFT HEART CATHETERIZATION WITH CORONARY ANGIOGRAM N/A 11/23/2012   Procedure: LEFT HEART CATHETERIZATION WITH CORONARY ANGIOGRAM;  Surgeon: Marykay Lex, MD;  Location: [facility] CATH LAB;  Service: Cardiovascular: NSTEMI: Culprit = mid RCA 95% & 75%; LAD ~40-50%, Small RI ~60%; EF ~60%, mild inf-basal HK      MALONEY DILATION N/A 03/03/2021   Procedure: MALONEY DILATION;  Surgeon:  Corbin Ade, MD;  Location: [facility];  Service: Endoscopy;  Laterality: N/A;   TRANSTHORACIC ECHOCARDIOGRAM  January 2017; March 2017   a. EF 35-40%. Entire inferior hypokinesis and akinesis of the basal and mid inferolateral and anterolateral /distal lateral walls;; b. EF 40 and 45%. Akinesis of basal-mid inferolateral wall. Hypokinesis of entire lateral wall.   Social History:  reports that he quit smoking about 59 years ago. His smoking use included cigarettes. He started smoking about 61 years ago. He has a 0.5 pack-year smoking history. He has never used smokeless tobacco. He reports that he does not drink alcohol and does not use drugs.  No Known Allergies  Family History  Problem Relation Age of Onset   Diabetes Mother    Cancer Father        prostate   Colon cancer Neg Hx    Esophageal cancer Neg Hx    Stomach cancer Neg Hx     Prior to  Admission medications   Medication Sig Start Date End Date Taking? Authorizing Provider  atorvastatin (LIPITOR) 40 MG tablet Take 1 tablet (40 mg total) by mouth daily. 11/19/20 03/03/22  Elenore Paddy, NP  clopidogrel (PLAVIX) 75 MG tablet TAKE 1 TABLET BY MOUTH EVERY DAY 11/05/20   Wilson Singer, MD  [facility] VITAMIN B12 1000 MCG TBCR TAKE 1 TABLET BY MOUTH EVERY DAY 09/08/20   Wilson Singer, MD  furosemide (LASIX) 20 MG tablet Take 1 tablet (20 mg total) by mouth every Monday, Wednesday, and Friday. 02/22/23   Marykay Lex, MD  lisinopril (ZESTRIL) 2.5 MG tablet Take 1 tablet (2.5 mg total) by mouth every Tuesday, Thursday, Saturday, and Sunday. 02/23/23   Marykay Lex, MD  memantine (NAMENDA) 5 MG tablet Take 5 mg by mouth 2 (two) times daily. 12/11/21   [provider]  nitroGLYCERIN (NITROSTAT) 0.4 MG SL tablet Place 0.4 mg under the tongue every 5 (five) minutes as needed for chest pain. Patient not taking: Reported on 02/23/2023    [provider]  ondansetron (ZOFRAN-ODT) 4 MG disintegrating tablet Take 1 tablet  (4 mg total) by mouth every 8 (eight) hours as needed for nausea or vomiting. 03/12/22   Particia Nearing, PA-C  pantoprazole (PROTONIX) 40 MG tablet TAKE 1 TABLET BY MOUTH EVERY DAY 05/18/22   Raquel Seaver, NP    Physical Exam: Vitals:   03/25/23 1300 03/25/23 1301 03/25/23 1305 03/25/23 1823  BP: (!) 156/74   134/71  Pulse: 85   85  Resp: (!) 21   16  Temp: 100 F (37.8 C)   98.8 F (37.1 C)  TempSrc: Oral   Oral  SpO2: 96%   96%  Weight:  76.2 kg 76.2 kg   Height:  5\' 6"  (1.676 m) 5\' 6"  (1.676 m)   General: Chronically ill elderly man laying in bed. No acute distress. HEENT: [place]/AT. Dry mucous membrane. CV: RRR. No murmurs, rubs, or gallops. No LE edema Pulmonary: Lungs CTAB. Normal effort. No wheezing or rales. Abdominal: Soft, nontender, nondistended. Normal bowel sounds. Extremities: Palpable radial and DP pulses. Normal ROM. Skin: Warm and dry. No obvious rash or lesions. Neuro: Oriented to self and DOB. Not oriented to place or situation. Moving all extremities.  Data Reviewed:   Results are pending, will review when available. Pending results of MRSA screen and repeat blood cultures  Assessment and Plan: KAISEI KERCHNER is a 77 y.o. male with medical history significant of CAD, CKD3, CLL (in remission), CHF, dementia, GERD, HLD, IDA who presented due to a positive blood culture and admitted for sepsis.  #Sepsis #Bacteremia #Community-acquired pneumonia Patient with a history of chronic lymphocytic leukemia found to have positive blood culture (staph species) during evaluation in the ED yesterday in the setting of few days of generalized weakness and cough. Patient meets sepsis protocol due to tachypnea, AKI and source of infection which includes pneumonia and bacteremia. No signs of UTI on UA. He remains on room air and currently hemodynamically stable. Lactic acid within normal limits but slightly uptrending. Will repeat this in the morning. -Admit to stepdown  unit -Continue sepsis protocol with IV hydration -Continue IV Rocephin and azithromycin for 5 days -Check RVP and sputum culture -Follow-up repeat blood culture -Check morning lactic acid, cortisol, procalcitonin -Trend CBC and CMP -Supplemental O2 as needed  #AKI on CKD 3 No clear baseline creatinine likely around 1.4 based on chart review. Creatinine elevated to 1.84 likely in  the setting of current infection. Will continue IV hydration and repeat BMP in the morning. -IV hydration per sepsis protocol -Follow-up a.m. CMP  #CLL Follows with Dr. Lorelle Formosa. Currently in remission and not receiving any chemo -Follow-up oncology in the outpatient  #Dementia -Currently n.p.o., hold memantine -Delirium precautions   Advance Care Planning:   Code Status: Limited: Do not attempt resuscitation (DNR) -DNR-LIMITED -Do Not Intubate/DNI    Consults: None  Family Communication: Discussed admission to ICU with spouse at bedside  Severity of Illness: The appropriate patient status for this patient is INPATIENT. Inpatient status is judged to be reasonable and necessary in order to provide the required intensity of service to ensure the patient's safety. The patient's presenting symptoms, physical exam findings, and initial radiographic and laboratory data in the context of their chronic comorbidities is felt to place them at high risk for further clinical deterioration. Furthermore, it is not anticipated that the patient will be medically stable for discharge from the hospital within 2 midnights of admission.   * I certify that at the point of admission it is my clinical judgment that the patient will require inpatient hospital care spanning beyond 2 midnights from the point of admission due to high intensity of service, high risk for further deterioration and high frequency of surveillance required.*  Author: Steffanie Rainwater, MD 03/25/2023 8:35 PM  For on call review [email or web address].

## 2023-03-25 NOTE — Sepsis Progress Note (Signed)
Sepsis protocol is being followed by [facility]. 

## 2023-03-26 ENCOUNTER — Inpatient Hospital Stay (HOSPITAL_COMMUNITY): Payer: Medicare HMO

## 2023-03-26 DIAGNOSIS — I5022 Chronic systolic (congestive) heart failure: Secondary | ICD-10-CM | POA: Insufficient documentation

## 2023-03-26 DIAGNOSIS — N179 Acute kidney failure, unspecified: Secondary | ICD-10-CM | POA: Insufficient documentation

## 2023-03-26 DIAGNOSIS — C9110 Chronic lymphocytic leukemia of B-cell type not having achieved remission: Secondary | ICD-10-CM

## 2023-03-26 DIAGNOSIS — F0390 Unspecified dementia without behavioral disturbance: Secondary | ICD-10-CM

## 2023-03-26 DIAGNOSIS — R6510 Systemic inflammatory response syndrome (SIRS) of non-infectious origin without acute organ dysfunction: Secondary | ICD-10-CM | POA: Diagnosis not present

## 2023-03-26 DIAGNOSIS — R7881 Bacteremia: Secondary | ICD-10-CM

## 2023-03-26 DIAGNOSIS — N1831 Chronic kidney disease, stage 3a: Secondary | ICD-10-CM

## 2023-03-26 DIAGNOSIS — Z856 Personal history of leukemia: Secondary | ICD-10-CM

## 2023-03-26 DIAGNOSIS — I2510 Atherosclerotic heart disease of native coronary artery without angina pectoris: Secondary | ICD-10-CM

## 2023-03-26 LAB — CBC
HCT: 42.4 % (ref 39.0–52.0)
Hemoglobin: 13.3 g/dL (ref 13.0–17.0)
MCH: 30.4 pg (ref 26.0–34.0)
MCHC: 31.4 g/dL (ref 30.0–36.0)
MCV: 96.8 fL (ref 80.0–100.0)
Platelets: 147 10*3/uL — ABNORMAL LOW (ref 150–400)
RBC: 4.38 MIL/uL (ref 4.22–5.81)
RDW: 14.5 % (ref 11.5–15.5)
WBC: 25.3 10*3/uL — ABNORMAL HIGH (ref 4.0–10.5)
nRBC: 0.1 % (ref 0.0–0.2)

## 2023-03-26 LAB — COMPREHENSIVE METABOLIC PANEL
ALT: 11 U/L (ref 0–44)
AST: 21 U/L (ref 15–41)
Albumin: 3 g/dL — ABNORMAL LOW (ref 3.5–5.0)
Alkaline Phosphatase: 64 U/L (ref 38–126)
Anion gap: 8 (ref 5–15)
BUN: 15 mg/dL (ref 8–23)
CO2: 22 mmol/L (ref 22–32)
Calcium: 7.9 mg/dL — ABNORMAL LOW (ref 8.9–10.3)
Chloride: 102 mmol/L (ref 98–111)
Creatinine, Ser: 1.77 mg/dL — ABNORMAL HIGH (ref 0.61–1.24)
GFR, Estimated: 39 mL/min — ABNORMAL LOW (ref >60)
Glucose, Bld: 112 mg/dL — ABNORMAL HIGH (ref 70–99)
Potassium: 4.3 mmol/L (ref 3.5–5.1)
Sodium: 132 mmol/L — ABNORMAL LOW (ref 135–145)
Total Bilirubin: 1.9 mg/dL — ABNORMAL HIGH (ref 0.3–1.2)
Total Protein: 5.4 g/dL — ABNORMAL LOW (ref 6.5–8.1)

## 2023-03-26 LAB — CULTURE, BLOOD (ROUTINE X 2): Culture  Setup Time: NO GROWTH

## 2023-03-26 LAB — LACTIC ACID, PLASMA: Lactic Acid, Venous: 1.3 mmol/L (ref 0.5–1.9)

## 2023-03-26 LAB — SARS CORONAVIRUS 2 BY RT PCR: SARS Coronavirus 2 by RT PCR: NEGATIVE

## 2023-03-26 LAB — PROCALCITONIN: Procalcitonin: 0.16 ng/mL

## 2023-03-26 LAB — CORTISOL-AM, BLOOD: Cortisol - AM: 14.9 ug/dL (ref 6.7–22.6)

## 2023-03-26 MED ORDER — LACTATED RINGERS IV SOLN
INTRAVENOUS | Status: AC
Start: 2023-03-26 — End: 2023-03-27

## 2023-03-26 MED ORDER — ENOXAPARIN SODIUM 40 MG/0.4ML IJ SOSY
40.0000 mg | PREFILLED_SYRINGE | INTRAMUSCULAR | Status: DC
  Administered 2023-03-26 – 2023-03-27 (×2): 40 mg via SUBCUTANEOUS
  Filled 2023-03-26 (×2): qty 0.4

## 2023-03-26 MED ORDER — ORAL CARE MOUTH RINSE: 15.0000 mL | OROMUCOSAL | Status: DC | PRN

## 2023-03-26 NOTE — Plan of Care (Signed)

## 2023-03-26 NOTE — TOC CM/SW Note (Signed)
Transition of Care [facility]) - Inpatient Brief Assessment   Patient Details  Name: [name] MRN: [id number] Date of Birth: 06-29-1945  Transition of Care [facility]) CM/SW Contact:    Villa Herb, LCSWA Phone Number: 03/26/2023, 10:35 AM   Clinical Narrative: Transition of Care Department [facility]) has reviewed patient and no TOC needs have been identified at this time. We will continue to monitor patient advancement through interdisciplinary progression rounds. If new patient transition needs arise, please place a TOC consult.  Transition of Care Asessment: Insurance and Status: Insurance coverage has been reviewed Patient has primary care physician: Yes Home environment has been reviewed: from home Prior level of function:: wife assissts if needed Prior/Current Home Services: No current home services Social Determinants of Health Reivew: SDOH reviewed no interventions necessary Readmission risk has been reviewed: Yes Transition of care needs: no transition of care needs at this time

## 2023-03-26 NOTE — Progress Notes (Signed)
PROGRESS NOTE  [name] XBM:[id number] DOB: 02-Oct-1945 DOA: 03/25/2023 PCP: Benita Stabile, MD  Brief History:  77 year old male with a history of coronary artery disease, CKD stage III, CLL, HFrEF, dementia, hypertension, hyperlipidemia, GERD presenting with falls, generalized weakness, and decreased oral intake.  The patient is unable to provide a significant history secondary to his dementia.  History is obtained from review of medical record and speaking with the patient and spouse at the bedside.  The patient has had decreased oral intake and generalized weakness for about a week.  He has been complaining of some suprapubic discomfort and pain and dysuria.  There is been no complaints of fever, chills, headache, chest pain, shortness breath, vomiting, diarrhea, hematochezia, melena.  The patient has been complaining of some suprapubic discomfort for about a week.  The patient was brought to the emergency department on 03/23/2023 due to the above symptoms.  He had a temperature of 100.7.  Workup at that time revealed serum creatinine 1.3, UA>50 RBC, lactic acid 1.0, and negative chest x-ray.  The patient was discharged home in stable condition.  The patient returned back to the emergency department on 03/25/2023 when he was contacted for positive blood culture.  Wife states that since returning home he had a fever up to 103.0 F and generalized weakness continued.  He complained of some myalgias and arthralgias.  He had nonproductive cough.  In the ED, the patient had a temperature of 100.0 F.  He was hemodynamically stable with oxygen saturation of 95-96% on room air.  WBC 29.0, hemoglobin 14.7, platelets 120.  Sodium 132, potassium 4.3, bicarbonate 22, serum creatinine 1.77.  The patient was started on IV fluids, ceftriaxone, azithromycin.     Assessment/Plan: SIRS -Patient presented with fever, leukocytosis -Etiology unclear, workup in progress -Personally reviewed chest  x-ray--poor inspiration, increased bronchitic markings -Lactic acid 1.4>> 1.8>> 1.3 -03/26/2023 PCT 0.16 -Continue empiric ceftriaxone and azithromycin -Follow blood cultures -Continue IV fluids  Bacteremia -Suspect this is contaminant -03/24/2023 blood culture--GPC 1 out of 2 sets -03/24/2023 multiplex PCR--staph species -Follow repeat blood cultures  Abdominal pain -CT abdomen and pelvis -UA no WBC  Acute on chronic renal failure-CKD stage IIIa -Baseline creatinine 1.4-1.5 -Presented with serum creatinine 1.84 -Continue IV fluids  CLL -Patient follows up with Dr. Ellin Saba -Originally diagnosed 2008 -On watchful waiting since then -Follow-up [facility]  History of DVT -Diagnosed with DVT 05/2019 -IVC filter placed on 05/27/2019 -ASA not started secondary to GI bleed due to diverticulosis  Dementia without behavioral disturbance -Continue Namenda -Wife endorses gradual cognitive decline  Coronary artery disease -No chest pain presently -CAD S/P PCI-- RCA DES 2014, CFX DES Jan 2017  -Continue Plavix  Essential hypertension -Holding lisinopril secondary to AKI -Normally takes lisinopril 2.5 mg on Tuesdays, Thursday, and Sundays and alternates with furosemide 20 mg on Mondays, Wednesday, Fridays -Hold furosemide  Chronic HFimpEF -06/08/2015 echo EF 35-40%, grade 1 DD, positive WMA -10/05/2021 echo EF 55-60%, no WMA, normal RVF -Clinically dry  Mixed hyperlipidemia -Previously on statin, recently stopped 02/22/2023 after seeing Dr. Herbie Baltimore      Family Communication:   spouse updated at bedside 10/20  Consultants:  none  Code Status:  DNR  DVT Prophylaxis:  [place] Heparin / [place] Lovenox   Procedures: As Listed in Progress Note Above  Antibiotics: Ceftriaxone 10/19>> Azithro 10/19>>      Subjective: Patient denies fevers, chills, headache, chest pain, dyspnea,  nausea, vomiting, diarrhea, dysuria, hematuria;  has some suprapubic  pain Objective: Vitals:   03/26/23 0400 03/26/23 0500 03/26/23 0600 03/26/23 0809  BP: (!) 142/56 (!) 162/60 (!) 148/77   Pulse: 69 66 79   Resp: 20 (!) 21 20   Temp:   98.2 F (36.8 C) 99.7 F (37.6 C)  TempSrc:   Oral Axillary  SpO2: 97% 96% 94%   Weight:   73.6 kg   Height:        Intake/Output Summary (Last 24 hours) at 03/26/2023 0840 Last data filed at 03/26/2023 0604 Gross per 24 hour  Intake 1590.14 ml  Output 650 ml  Net 940.14 ml   Weight change:  Exam:  General:  Pt is alert, follows commands appropriately, not in acute distress HEENT: No icterus, No thrush, No neck mass, [place]/AT Cardiovascular: RRR, S1/S2, no rubs, no gallops Respiratory: Bibasilar rales, right greater than left.  No wheezing.  Good air movement Abdomen: Soft/+BS, non tender, non distended, no guarding Extremities: No edema, No lymphangitis, No petechiae, No rashes, no synovitis   Data Reviewed: I have personally reviewed following labs and imaging studies Basic Metabolic Panel: Recent Labs  Lab 03/23/23 2145 03/25/23 1407 03/26/23 0328  NA 136 132* 132*  K 4.3 4.3 4.3  CL 102 101 102  CO2 25 22 22   GLUCOSE 127* 118* 112*  BUN 12 15 15   CREATININE 1.83* 1.84* 1.77*  CALCIUM 8.7* 8.3* 7.9*   Liver Function Tests: Recent Labs  Lab 03/23/23 2145 03/25/23 1554 03/26/23 0328  AST 24 23 21   ALT 15 16 11   ALKPHOS 86 73 64  BILITOT 1.5* 2.2* 1.9*  PROT 6.6 7.0 5.4*  ALBUMIN 3.9 3.9 3.0*   No results for input(s): "LIPASE", "AMYLASE" in the last 168 hours. No results for input(s): "AMMONIA" in the last 168 hours. Coagulation Profile: Recent Labs  Lab 03/24/23 0340 03/25/23 1655  INR 1.0 1.2   CBC: Recent Labs  Lab 03/23/23 2145 03/25/23 1407 03/26/23 0328  WBC 29.8* 29.0* 25.3*  NEUTROABS  --  5.8  --   HGB 14.8 14.7 13.3  HCT 47.8 46.6 42.4  MCV 97.6 97.5 96.8  PLT 187 170 147*   Cardiac Enzymes: No results for input(s): "CKTOTAL", "CKMB", "CKMBINDEX",  "TROPONINI" in the last 168 hours. BNP: Invalid input(s): "POCBNP" CBG: Recent Labs  Lab 03/24/23 0257  GLUCAP 105*   HbA1C: No results for input(s): "HGBA1C" in the last 72 hours. Urine analysis:    Component Value Date/Time   COLORURINE YELLOW 03/25/2023 1740   APPEARANCEUR HAZY (A) 03/25/2023 1740   LABSPEC 1.011 03/25/2023 1740   PHURINE 7.0 03/25/2023 1740   GLUCOSEU NEGATIVE 03/25/2023 1740   HGBUR MODERATE (A) 03/25/2023 1740   BILIRUBINUR NEGATIVE 03/25/2023 1740   BILIRUBINUR negative 01/15/2022 1249   KETONESUR NEGATIVE 03/25/2023 1740   PROTEINUR 30 (A) 03/25/2023 1740   UROBILINOGEN 0.2 01/15/2022 1249   NITRITE NEGATIVE 03/25/2023 1740   LEUKOCYTESUR NEGATIVE 03/25/2023 1740   Sepsis Labs: @LABRCNTIP (procalcitonin:4,lacticidven:4) ) Recent Results (from the past 240 hour(s))  Blood Culture (routine x 2)     Status: None (Preliminary result)   Collection Time: 03/24/23  3:40 AM   Specimen: BLOOD  Result Value Ref Range Status   Specimen Description BLOOD BLOOD LEFT ARM  Final   Special Requests NONE  Final   Culture   Final    NO GROWTH 2 DAYS Performed at [facility], [address], [place], [place] [address]  Report Status PENDING  Incomplete  Blood Culture (routine x 2)     Status: None (Preliminary result)   Collection Time: 03/24/23  3:40 AM   Specimen: BLOOD  Result Value Ref Range Status   Specimen Description   Final    BLOOD BLOOD LEFT HAND Performed at [facility], [address], [place], [place] [address]    Special Requests   Final    NONE Performed at [facility], [address], [place], [place] [address]    Culture  Setup Time   Final    GRAM POSITIVE COCCI AEROBIC BOTTLE ONLY Gram Stain Report Called to,Read Back By and Verified With: LONG L @ 0829 ON [id number] BY HENDERSON L GRAM STAIN REVIEWED-AGREE WITH RESULT CRITICAL RESULT CALLED TO, READ BACK BY AND VERIFIED WITH: RN LINDA LONG [phone] 1533 BY Berline Chough, [place] Performed at  [facility], [address], [place], [place] [address]    Culture GRAM POSITIVE COCCI  Final   Report Status PENDING  Incomplete  Blood Culture ID Panel (Reflexed)     Status: Abnormal   Collection Time: 03/24/23  3:40 AM  Result Value Ref Range Status   Enterococcus faecalis NOT DETECTED NOT DETECTED Final   Enterococcus Faecium NOT DETECTED NOT DETECTED Final   Listeria monocytogenes NOT DETECTED NOT DETECTED Final   Staphylococcus species DETECTED (A) NOT DETECTED Final    Comment: CRITICAL RESULT CALLED TO, READ BACK BY AND VERIFIED WITH: RN LINDA LONG [phone] 1530 BY J RAZZAK, MT    Staphylococcus aureus (BCID) NOT DETECTED NOT DETECTED Final   Staphylococcus epidermidis NOT DETECTED NOT DETECTED Final   Staphylococcus lugdunensis NOT DETECTED NOT DETECTED Final   Streptococcus species NOT DETECTED NOT DETECTED Final   Streptococcus agalactiae NOT DETECTED NOT DETECTED Final   Streptococcus pneumoniae NOT DETECTED NOT DETECTED Final   Streptococcus pyogenes NOT DETECTED NOT DETECTED Final   A.calcoaceticus-baumannii NOT DETECTED NOT DETECTED Final   Bacteroides fragilis NOT DETECTED NOT DETECTED Final   Enterobacterales NOT DETECTED NOT DETECTED Final   Enterobacter cloacae complex NOT DETECTED NOT DETECTED Final   Escherichia coli NOT DETECTED NOT DETECTED Final   Klebsiella aerogenes NOT DETECTED NOT DETECTED Final   Klebsiella oxytoca NOT DETECTED NOT DETECTED Final   Klebsiella pneumoniae NOT DETECTED NOT DETECTED Final   Proteus species NOT DETECTED NOT DETECTED Final   Salmonella species NOT DETECTED NOT DETECTED Final   Serratia marcescens NOT DETECTED NOT DETECTED Final   Haemophilus influenzae NOT DETECTED NOT DETECTED Final   Neisseria meningitidis NOT DETECTED NOT DETECTED Final   Pseudomonas aeruginosa NOT DETECTED NOT DETECTED Final   Stenotrophomonas maltophilia NOT DETECTED NOT DETECTED Final   Candida albicans NOT DETECTED NOT DETECTED Final    Candida auris NOT DETECTED NOT DETECTED Final   Candida glabrata NOT DETECTED NOT DETECTED Final   Candida krusei NOT DETECTED NOT DETECTED Final   Candida parapsilosis NOT DETECTED NOT DETECTED Final   Candida tropicalis NOT DETECTED NOT DETECTED Final   Cryptococcus neoformans/gattii NOT DETECTED NOT DETECTED Final    Comment: Performed at [facility], [address], [place], [place] [address]  Blood Culture (routine x 2)     Status: None (Preliminary result)   Collection Time: 03/25/23  3:22 PM   Specimen: BLOOD  Result Value Ref Range Status   Specimen Description BLOOD RIGHT ANTECUBITAL  Final   Special Requests   Final    BOTTLES DRAWN AEROBIC AND ANAEROBIC Blood Culture  adequate volume   Culture   Final    NO GROWTH < 24 HOURS Performed at [facility], [address] [place], [place] [address]    Report Status PENDING  Incomplete  Blood Culture (routine x 2)     Status: None (Preliminary result)   Collection Time: 03/25/23  3:54 PM   Specimen: BLOOD  Result Value Ref Range Status   Specimen Description BLOOD RAC  Final   Special Requests   Final    BOTTLES DRAWN AEROBIC AND ANAEROBIC Blood Culture adequate volume   Culture   Final    NO GROWTH < 24 HOURS Performed at [facility], [address], [place], [place] [address]    Report Status PENDING  Incomplete  MRSA Next Gen by PCR, Nasal     Status: None   Collection Time: 03/25/23  8:23 PM   Specimen: Nasal Mucosa; Nasal Swab  Result Value Ref Range Status   MRSA by PCR Next Gen NOT DETECTED NOT DETECTED Final    Comment: (NOTE) The GeneXpert MRSA Assay (FDA approved for NASAL specimens only), is one component of a comprehensive MRSA colonization surveillance program. It is not intended to diagnose MRSA infection nor to guide or monitor treatment for MRSA infections. Test performance is not FDA approved in patients less than 71 years old. Performed at [facility], [address] [place], [place] [address]       Scheduled Meds:  Chlorhexidine Gluconate Cloth  6 each Topical Q0600   enoxaparin (LOVENOX) injection  30 mg Subcutaneous Q24H   Continuous Infusions:  azithromycin Stopped (03/25/23 2059)   cefTRIAXone (ROCEPHIN)  IV Stopped (03/25/23 1751)   lactated ringers 150 mL/hr (03/26/23 0617)    Procedures/Studies: DG Chest Portable 1 View  Result Date: 03/25/2023 CLINICAL DATA:  Bacteremia EXAM: PORTABLE CHEST 1 VIEW COMPARISON:  Chest x-ray dated March 24, 2023 FINDINGS: Cardiac and mediastinal contours are unchanged. Low lung volumes with hypoventilatory changes. Left retrocardiac opacity. No evidence of pleural effusion or pneumothorax. IMPRESSION: Left retrocardiac opacity, could be due to atelectasis, infection or aspiration. Electronically Signed   By: Allegra Lai M.D.   On: 03/25/2023 15:40   DG Chest Port 1 View  Result Date: 03/24/2023 CLINICAL DATA:  Sepsis.  Altered mental status.  High fall risk. EXAM: PORTABLE CHEST 1 VIEW COMPARISON:  11/07/2022 FINDINGS: Shallow inspiration. Heart size and pulmonary vascularity are normal. Peribronchial thickening with streaky perihilar opacities suggesting acute or chronic bronchitis. No focal consolidation or airspace disease. No pleural effusions. No pneumothorax. Mediastinal contours appear intact. IMPRESSION: Shallow inspiration. Bronchitic changes in the lungs. No focal consolidation. Electronically Signed   By: Burman Nieves M.D.   On: 03/24/2023 03:32    Catarina Hartshorn, DO  [facility]  If 7PM-7AM, please contact night-coverage [email or web address] Password [email or web address] 03/26/2023, 8:40 AM   LOS: 1 day

## 2023-03-26 NOTE — Hospital Course (Signed)
76 year old male with a history of coronary artery disease, CKD stage III, CLL, HFrEF, dementia, hypertension, hyperlipidemia, GERD presenting with falls, generalized weakness, and decreased oral intake.  The patient is unable to provide a significant history secondary to his dementia.  History is obtained from review of medical record and speaking with the patient and spouse at the bedside.  The patient has had decreased oral intake and generalized weakness for about a week.  He has been complaining of some suprapubic discomfort and pain and dysuria.  There is been no complaints of fever, chills, headache, chest pain, shortness breath, vomiting, diarrhea, hematochezia, melena.  The patient has been complaining of some suprapubic discomfort for about a week.  The patient was brought to the emergency department on 03/23/2023 due to the above symptoms.  He had a temperature of 100.7.  Workup at that time revealed serum creatinine 1.3, UA>50 RBC, lactic acid 1.0, and negative chest x-ray.  The patient was discharged home in stable condition.  The patient returned back to the emergency department on 03/25/2023 when he was contacted for positive blood culture.  Wife states that since returning home he had a fever up to 103.0 F and generalized weakness continued.  He complained of some myalgias and arthralgias.  He had nonproductive cough.  In the ED, the patient had a temperature of 100.0 F.  He was hemodynamically stable with oxygen saturation of 95-96% on room air.  WBC 29.0, hemoglobin 14.7, platelets 120.  Sodium 132, potassium 4.3, bicarbonate 22, serum creatinine 1.77.  The patient was started on IV fluids, ceftriaxone, azithromycin.

## 2023-03-27 ENCOUNTER — Telehealth (HOSPITAL_BASED_OUTPATIENT_CLINIC_OR_DEPARTMENT_OTHER): Payer: Self-pay | Admitting: *Deleted

## 2023-03-27 DIAGNOSIS — J181 Lobar pneumonia, unspecified organism: Secondary | ICD-10-CM

## 2023-03-27 DIAGNOSIS — C9110 Chronic lymphocytic leukemia of B-cell type not having achieved remission: Secondary | ICD-10-CM | POA: Diagnosis not present

## 2023-03-27 DIAGNOSIS — A419 Sepsis, unspecified organism: Secondary | ICD-10-CM

## 2023-03-27 LAB — RESPIRATORY PANEL BY PCR

## 2023-03-27 LAB — EXPECTORATED SPUTUM ASSESSMENT W GRAM STAIN, RFLX TO RESP C

## 2023-03-27 LAB — CBC
HCT: 41.2 % (ref 39.0–52.0)
Hemoglobin: 12.7 g/dL — ABNORMAL LOW (ref 13.0–17.0)
MCH: 29.8 pg (ref 26.0–34.0)
MCHC: 30.8 g/dL (ref 30.0–36.0)
MCV: 96.7 fL (ref 80.0–100.0)
Platelets: 160 10*3/uL (ref 150–400)
RBC: 4.26 MIL/uL (ref 4.22–5.81)
RDW: 14.5 % (ref 11.5–15.5)
WBC: 23.4 10*3/uL — ABNORMAL HIGH (ref 4.0–10.5)
nRBC: 0 % (ref 0.0–0.2)

## 2023-03-27 LAB — BASIC METABOLIC PANEL
Anion gap: 6 (ref 5–15)
BUN: 15 mg/dL (ref 8–23)
CO2: 24 mmol/L (ref 22–32)
Calcium: 7.9 mg/dL — ABNORMAL LOW (ref 8.9–10.3)
Chloride: 105 mmol/L (ref 98–111)
Creatinine, Ser: 1.5 mg/dL — ABNORMAL HIGH (ref 0.61–1.24)
GFR, Estimated: 47 mL/min — ABNORMAL LOW (ref >60)
Glucose, Bld: 120 mg/dL — ABNORMAL HIGH (ref 70–99)
Potassium: 3.9 mmol/L (ref 3.5–5.1)
Sodium: 135 mmol/L (ref 135–145)

## 2023-03-27 LAB — MAGNESIUM: Magnesium: 1.8 mg/dL (ref 1.7–2.4)

## 2023-03-27 NOTE — Progress Notes (Signed)
PROGRESS NOTE  [name] WUJ:[id number] DOB: 06/19/1945 DOA: 03/25/2023 PCP: Benita Stabile, MD  Brief History:  77 year old male with a history of coronary artery disease, CKD stage III, CLL, HFrEF, dementia, hypertension, hyperlipidemia, GERD presenting with falls, generalized weakness, and decreased oral intake.  The patient is unable to provide a significant history secondary to his dementia.  History is obtained from review of medical record and speaking with the patient and spouse at the bedside.  The patient has had decreased oral intake and generalized weakness for about a week.  He has been complaining of some suprapubic discomfort and pain and dysuria.  There is been no complaints of fever, chills, headache, chest pain, shortness breath, vomiting, diarrhea, hematochezia, melena.  The patient has been complaining of some suprapubic discomfort for about a week.  The patient was brought to the emergency department on 03/23/2023 due to the above symptoms.  He had a temperature of 100.7.  Workup at that time revealed serum creatinine 1.3, UA>50 RBC, lactic acid 1.0, and negative chest x-ray.  The patient was discharged home in stable condition.  The patient returned back to the emergency department on 03/25/2023 when he was contacted for positive blood culture.  Wife states that since returning home he had a fever up to 103.0 F and generalized weakness continued.  He complained of some myalgias and arthralgias.  He had nonproductive cough.  In the ED, the patient had a temperature of 100.0 F.  He was hemodynamically stable with oxygen saturation of 95-96% on room air.  WBC 29.0, hemoglobin 14.7, platelets 120.  Sodium 132, potassium 4.3, bicarbonate 22, serum creatinine 1.77.  The patient was started on IV fluids, ceftriaxone, azithromycin.     Assessment/Plan:  Sepsis -Patient presented with fever, leukocytosis -due to pneumonia -Personally reviewed chest x-ray--poor  inspiration, increased bronchitic markings -Lactic acid 1.4>> 1.8>> 1.3 -03/26/2023 PCT 0.16 -Continue empiric ceftriaxone and azithromycin -Follow blood cultures--neg to date -initially on IV fluids  Lobar pneumonia -10/20 CT chest--peribronchovascular consolidation, RLL>>LLL -continue ceftriaxone and azithro   Bacteremia -represents contaminant -03/24/2023 blood culture--S. capitis -03/24/2023 multiplex PCR--staph species -03/25/23 blood cultures--neg   Abdominal pain -CT abdomen and pelvis--no acute finding -UA no WBC -likely due to intermittent urine retention and bladder distension at home   Acute on chronic renal failure-CKD stage IIIa -Baseline creatinine 1.4-1.5 -Presented with serum creatinine 1.84 -Continue IV fluids>>improved   CLL -Patient follows up with Dr. Ellin Saba -Originally diagnosed 2008 -On watchful waiting since then -Follow-up [facility]   History of DVT -Diagnosed with DVT 05/2019 -IVC filter placed on 05/27/2019 -ASA not started secondary to GI bleed due to diverticulosis   Dementia without behavioral disturbance -Continue Namenda -Wife endorses gradual cognitive decline   Coronary artery disease -No chest pain presently -CAD S/P PCI-- RCA DES 2014, CFX DES Jan 2017  -Continue Plavix   Essential hypertension -Holding lisinopril secondary to AKI -Normally takes lisinopril 2.5 mg on Tuesdays, Thursday, and Sundays and alternates with furosemide 20 mg on Mondays, Wednesday, Fridays -Hold furosemide   Chronic HFimpEF -06/08/2015 echo EF 35-40%, grade 1 DD, positive WMA -10/05/2021 echo EF 55-60%, no WMA, normal RVF -Clinically dry   Mixed hyperlipidemia -Previously on statin, recently stopped 02/22/2023 after seeing Dr. Herbie Baltimore           Family Communication:   spouse updated at bedside 10/21   Consultants:  none   Code Status:  DNR  DVT Prophylaxis: [place] Lovenox     Procedures: As Listed in Progress Note Above    Antibiotics: Ceftriaxone 10/19>> Azithro 10/19>>          Subjective: Pt is feeling better.  Denies f/c, cp, sob, n//v/d.  Abd pain is improving  Objective: Vitals:   03/27/23 1200 03/27/23 1300 03/27/23 1400 03/27/23 1600  BP: (!) 142/67 (!) 147/58 (!) 152/60   Pulse: 70 67 69   Resp: (!) 21 19 (!) 25   Temp: 98 F (36.7 C)   98 F (36.7 C)  TempSrc: Axillary   Oral  SpO2: 97% 94% 95%   Weight:      Height:        Intake/Output Summary (Last 24 hours) at 03/27/2023 1736 Last data filed at 03/27/2023 1700 Gross per 24 hour  Intake 1200.66 ml  Output 3050 ml  Net -1849.34 ml   Weight change: 1.396 kg Exam:  General:  Pt is alert, follows commands appropriately, not in acute distress HEENT: No icterus, No thrush, No neck mass, [place]/AT Cardiovascular: bibasilar rales, R>L.  No wheeze Abdomen: Soft/+BS, non tender, non distended, no guarding Extremities: No edema, No lymphangitis, No petechiae, No rashes, no synovitis   Data Reviewed: I have personally reviewed following labs and imaging studies Basic Metabolic Panel: Recent Labs  Lab 03/23/23 2145 03/25/23 1407 03/26/23 0328 03/27/23 0419  NA 136 132* 132* 135  K 4.3 4.3 4.3 3.9  CL 102 101 102 105  CO2 25 22 22 24   GLUCOSE 127* 118* 112* 120*  BUN 12 15 15 15   CREATININE 1.83* 1.84* 1.77* 1.50*  CALCIUM 8.7* 8.3* 7.9* 7.9*  MG  --   --   --  1.8   Liver Function Tests: Recent Labs  Lab 03/23/23 2145 03/25/23 1554 03/26/23 0328  AST 24 23 21   ALT 15 16 11   ALKPHOS 86 73 64  BILITOT 1.5* 2.2* 1.9*  PROT 6.6 7.0 5.4*  ALBUMIN 3.9 3.9 3.0*   No results for input(s): "LIPASE", "AMYLASE" in the last 168 hours. No results for input(s): "AMMONIA" in the last 168 hours. Coagulation Profile: Recent Labs  Lab 03/24/23 0340 03/25/23 1655  INR 1.0 1.2   CBC: Recent Labs  Lab 03/23/23 2145 03/25/23 1407 03/26/23 0328 03/27/23 0419  WBC 29.8* 29.0* 25.3* 23.4*  NEUTROABS  --  5.8  --   --    HGB 14.8 14.7 13.3 12.7*  HCT 47.8 46.6 42.4 41.2  MCV 97.6 97.5 96.8 96.7  PLT 187 170 147* 160   Cardiac Enzymes: No results for input(s): "CKTOTAL", "CKMB", "CKMBINDEX", "TROPONINI" in the last 168 hours. BNP: Invalid input(s): "POCBNP" CBG: Recent Labs  Lab 03/24/23 0257  GLUCAP 105*   HbA1C: No results for input(s): "HGBA1C" in the last 72 hours. Urine analysis:    Component Value Date/Time   COLORURINE YELLOW 03/25/2023 1740   APPEARANCEUR HAZY (A) 03/25/2023 1740   LABSPEC 1.011 03/25/2023 1740   PHURINE 7.0 03/25/2023 1740   GLUCOSEU NEGATIVE 03/25/2023 1740   HGBUR MODERATE (A) 03/25/2023 1740   BILIRUBINUR NEGATIVE 03/25/2023 1740   BILIRUBINUR negative 01/15/2022 1249   KETONESUR NEGATIVE 03/25/2023 1740   PROTEINUR 30 (A) 03/25/2023 1740   UROBILINOGEN 0.2 01/15/2022 1249   NITRITE NEGATIVE 03/25/2023 1740   LEUKOCYTESUR NEGATIVE 03/25/2023 1740   Sepsis Labs: @LABRCNTIP (procalcitonin:4,lacticidven:4) ) Recent Results (from the past 240 hour(s))  Blood Culture (routine x 2)     Status: None (Preliminary result)   Collection Time:  03/24/23  3:40 AM   Specimen: BLOOD  Result Value Ref Range Status   Specimen Description BLOOD BLOOD LEFT ARM  Final   Special Requests NONE  Final   Culture   Final    NO GROWTH 3 DAYS Performed at [facility], [address], [place], [place] [address]    Report Status PENDING  Incomplete  Blood Culture (routine x 2)     Status: Abnormal   Collection Time: 03/24/23  3:40 AM   Specimen: BLOOD  Result Value Ref Range Status   Specimen Description   Final    BLOOD BLOOD LEFT HAND Performed at [facility], [address] [place], [place] [address]    Special Requests   Final    NONE Performed at [facility], [address], [place], [place] [address]    Culture  Setup Time   Final    GRAM POSITIVE COCCI AEROBIC BOTTLE ONLY Gram Stain Report Called to,Read Back By and Verified With: LONG L @ 0829 ON [id number] BY  HENDERSON L GRAM STAIN REVIEWED-AGREE WITH RESULT CRITICAL RESULT CALLED TO, READ BACK BY AND VERIFIED WITH: RN LINDA LONG [id number] BY J RAZZAK, MT    Culture (A)  Final    STAPHYLOCOCCUS CAPITIS THE SIGNIFICANCE OF ISOLATING THIS ORGANISM FROM A SINGLE SET OF BLOOD CULTURES WHEN MULTIPLE SETS ARE DRAWN IS UNCERTAIN. PLEASE NOTIFY THE MICROBIOLOGY DEPARTMENT WITHIN ONE WEEK IF SPECIATION AND SENSITIVITIES ARE REQUIRED. Performed at [facility], [address] [place], [place] [address]    Report Status 03/26/2023 FINAL  Final  Blood Culture ID Panel (Reflexed)     Status: Abnormal   Collection Time: 03/24/23  3:40 AM  Result Value Ref Range Status   Enterococcus faecalis NOT DETECTED NOT DETECTED Final   Enterococcus Faecium NOT DETECTED NOT DETECTED Final   Listeria monocytogenes NOT DETECTED NOT DETECTED Final   Staphylococcus species DETECTED (A) NOT DETECTED Final    Comment: CRITICAL RESULT CALLED TO, READ BACK BY AND VERIFIED WITH: RN LINDA LONG [phone] [id number] BY J RAZZAK, MT    Staphylococcus aureus (BCID) NOT DETECTED NOT DETECTED Final   Staphylococcus epidermidis NOT DETECTED NOT DETECTED Final   Staphylococcus lugdunensis NOT DETECTED NOT DETECTED Final   Streptococcus species NOT DETECTED NOT DETECTED Final   Streptococcus agalactiae NOT DETECTED NOT DETECTED Final   Streptococcus pneumoniae NOT DETECTED NOT DETECTED Final   Streptococcus pyogenes NOT DETECTED NOT DETECTED Final   A.calcoaceticus-baumannii NOT DETECTED NOT DETECTED Final   Bacteroides fragilis NOT DETECTED NOT DETECTED Final   Enterobacterales NOT DETECTED NOT DETECTED Final   Enterobacter cloacae complex NOT DETECTED NOT DETECTED Final   Escherichia coli NOT DETECTED NOT DETECTED Final   Klebsiella aerogenes NOT DETECTED NOT DETECTED Final   Klebsiella oxytoca NOT DETECTED NOT DETECTED Final   Klebsiella pneumoniae NOT DETECTED NOT DETECTED Final   Proteus species NOT DETECTED NOT DETECTED  Final   Salmonella species NOT DETECTED NOT DETECTED Final   Serratia marcescens NOT DETECTED NOT DETECTED Final   Haemophilus influenzae NOT DETECTED NOT DETECTED Final   Neisseria meningitidis NOT DETECTED NOT DETECTED Final   Pseudomonas aeruginosa NOT DETECTED NOT DETECTED Final   Stenotrophomonas maltophilia NOT DETECTED NOT DETECTED Final   Candida albicans NOT DETECTED NOT DETECTED Final   Candida auris NOT DETECTED NOT DETECTED Final   Candida glabrata NOT DETECTED NOT DETECTED Final   Candida krusei NOT DETECTED NOT DETECTED Final   Candida parapsilosis NOT DETECTED NOT  DETECTED Final   Candida tropicalis NOT DETECTED NOT DETECTED Final   Cryptococcus neoformans/gattii NOT DETECTED NOT DETECTED Final    Comment: Performed at [facility], [address], [place], [place] [address]  Blood Culture (routine x 2)     Status: None (Preliminary result)   Collection Time: 03/25/23  3:22 PM   Specimen: BLOOD  Result Value Ref Range Status   Specimen Description BLOOD RIGHT ANTECUBITAL  Final   Special Requests   Final    BOTTLES DRAWN AEROBIC AND ANAEROBIC Blood Culture adequate volume   Culture   Final    NO GROWTH 2 DAYS Performed at [facility], [address] [place], [place] [address]    Report Status PENDING  Incomplete  Blood Culture (routine x 2)     Status: None (Preliminary result)   Collection Time: 03/25/23  3:54 PM   Specimen: BLOOD  Result Value Ref Range Status   Specimen Description BLOOD RAC  Final   Special Requests   Final    BOTTLES DRAWN AEROBIC AND ANAEROBIC Blood Culture adequate volume   Culture   Final    NO GROWTH 2 DAYS Performed at [facility], [address] [place], [place] [address]    Report Status PENDING  Incomplete  MRSA Next Gen by PCR, Nasal     Status: None   Collection Time: 03/25/23  8:23 PM   Specimen: Nasal Mucosa; Nasal Swab  Result Value Ref Range Status   MRSA by PCR Next Gen NOT DETECTED NOT DETECTED Final    Comment:  (NOTE) The GeneXpert MRSA Assay (FDA approved for NASAL specimens only), is one component of a comprehensive MRSA colonization surveillance program. It is not intended to diagnose MRSA infection nor to guide or monitor treatment for MRSA infections. Test performance is not FDA approved in patients less than 9 years old. Performed at [facility], [address], [place], [place] [address]   SARS Coronavirus 2 by RT PCR (hospital order, performed in [facility] hospital lab) *cepheid single result test* Anterior Nasal Swab     Status: None   Collection Time: 03/26/23  9:06 AM   Specimen: Anterior Nasal Swab  Result Value Ref Range Status   SARS Coronavirus 2 by RT PCR NEGATIVE NEGATIVE Final    Comment: (NOTE) SARS-CoV-2 target nucleic acids are NOT DETECTED.  The SARS-CoV-2 RNA is generally detectable in upper and lower respiratory specimens during the acute phase of infection. The lowest concentration of SARS-CoV-2 viral copies this assay can detect is 250 copies / mL. A negative result does not preclude SARS-CoV-2 infection and should not be used as the sole basis for treatment or other patient management decisions.  A negative result may occur with improper specimen collection / handling, submission of specimen other than nasopharyngeal swab, presence of viral mutation(s) within the areas targeted by this assay, and inadequate number of viral copies (<250 copies / mL). A negative result must be combined with clinical observations, patient history, and epidemiological information.  Fact Sheet for Patients:   RoadLapTop.co.za  Fact Sheet for Healthcare Providers: http://kim-miller.com/  This test is not yet approved or  cleared by the [place] FDA and has been authorized for detection and/or diagnosis of SARS-CoV-2 by FDA under an Emergency Use Authorization (EUA).  This EUA will remain in effect (meaning this test can be used)  for the duration of the COVID-19 declaration under Section 564(b)(1) of the Act, 21 U.S.C. section 360bbb-3(b)(1), unless the authorization  is terminated or revoked sooner.  Performed at [facility], [address], [place], [place] [address]   Respiratory (~20 pathogens) panel by PCR     Status: None   Collection Time: 03/26/23 12:00 PM   Specimen: Nasopharyngeal Swab; Respiratory  Result Value Ref Range Status   Adenovirus NOT DETECTED NOT DETECTED Final   Coronavirus 229E NOT DETECTED NOT DETECTED Final    Comment: (NOTE) The Coronavirus on the Respiratory Panel, DOES NOT test for the novel  Coronavirus (2019 nCoV)    Coronavirus HKU1 NOT DETECTED NOT DETECTED Final   Coronavirus NL63 NOT DETECTED NOT DETECTED Final   Coronavirus OC43 NOT DETECTED NOT DETECTED Final   Metapneumovirus NOT DETECTED NOT DETECTED Final   Rhinovirus / Enterovirus NOT DETECTED NOT DETECTED Final   Influenza A NOT DETECTED NOT DETECTED Final   Influenza B NOT DETECTED NOT DETECTED Final   Parainfluenza Virus 1 NOT DETECTED NOT DETECTED Final   Parainfluenza Virus 2 NOT DETECTED NOT DETECTED Final   Parainfluenza Virus 3 NOT DETECTED NOT DETECTED Final   Parainfluenza Virus 4 NOT DETECTED NOT DETECTED Final   Respiratory Syncytial Virus NOT DETECTED NOT DETECTED Final   Bordetella pertussis NOT DETECTED NOT DETECTED Final   Bordetella Parapertussis NOT DETECTED NOT DETECTED Final   Chlamydophila pneumoniae NOT DETECTED NOT DETECTED Final   Mycoplasma pneumoniae NOT DETECTED NOT DETECTED Final    Comment: Performed at [facility], [address], [place], [place] [address]  Expectorated Sputum Assessment w Gram Stain, Rflx to Resp Cult     Status: None   Collection Time: 03/27/23  1:23 PM   Specimen: Sputum  Result Value Ref Range Status   Specimen Description SPU  Final   Special Requests NONE  Final   Sputum evaluation   Final    THIS SPECIMEN IS ACCEPTABLE FOR SPUTUM CULTURE Performed at  [facility], [address] [place], [place] [address]    Report Status 03/27/2023 FINAL  Final     Scheduled Meds:  Chlorhexidine Gluconate Cloth  6 each Topical Q0600   enoxaparin (LOVENOX) injection  40 mg Subcutaneous Q24H   Continuous Infusions:  azithromycin 500 mg (03/27/23 1555)   cefTRIAXone (ROCEPHIN)  IV 2 g (03/27/23 1508)    Procedures/Studies: CT CHEST ABDOMEN PELVIS WO CONTRAST  Result Date: 03/26/2023 CLINICAL DATA:  Shortness of breath fever EXAM: CT CHEST, ABDOMEN AND PELVIS WITHOUT CONTRAST TECHNIQUE: Multidetector CT imaging of the chest, abdomen and pelvis was performed following the standard protocol without IV contrast. RADIATION DOSE REDUCTION: This exam was performed according to the departmental dose-optimization program which includes automated exposure control, adjustment of the mA and/or kV according to patient size and/or use of iterative reconstruction technique. COMPARISON:  CT abdomen and pelvis dated August 15, 2022 FINDINGS: CT CHEST FINDINGS Cardiovascular: Normal heart size. No pericardial effusion. Normal caliber thoracic aorta with mild atherosclerotic disease. Severe coronary artery calcifications. Mediastinum/Nodes: Small hiatal hernia. Thyroid is unremarkable. Mildly enlarged subcarinal lymph node measuring 12 mm in short axis on series 2 image 26, likely reactive. Lungs/Pleura: Central airways are patent. Evaluation of the lung parenchyma is somewhat limited due to motion artifact. Peribronchovascular consolidations of the right-greater-than-left lower lobes. Small right pleural effusion. Musculoskeletal: No chest wall mass or suspicious bone lesions identified. CT ABDOMEN PELVIS FINDINGS Hepatobiliary: No focal liver abnormality is seen. Gallstones. No gallbladder wall thickening. No biliary ductal dilation. Pancreas: Unremarkable. No pancreatic ductal dilatation or surrounding inflammatory changes. Spleen: Normal in size without  focal abnormality.  Adrenals/Urinary Tract: Bilateral adrenal glands are unremarkable. No hydronephrosis. Simple appearing cyst of the pole of the left kidney, unchanged when compared with the prior. Bladder trabeculations. Stomach/Bowel: Stomach is within normal limits. Appendix appears normal. Diverticulosis. No evidence of bowel wall thickening, distention, or inflammatory changes. Vascular/Lymphatic: Aortic atherosclerosis. Infrarenal IVC filter, unchanged when compared with the prior. No enlarged abdominal or pelvic lymph nodes. Reproductive: Unchanged marked prostatomegaly. Other: No abdominopelvic ascites.  No intra-abdominal free air. Musculoskeletal: No acute or significant osseous findings. IMPRESSION: 1. Peribronchovascular consolidations of the right-greater-than-left lower lobes, likely due to aspiration or infection. 2. Small right pleural effusion. 3. No acute findings in the abdomen or pelvis. 4. Bladder trabeculations and marked prostatomegaly, consistent with chronic outlet obstruction. 5. Coronary artery calcifications and aortic Atherosclerosis (ICD10-I70.0). Electronically Signed   By: Allegra Lai M.D.   On: 03/26/2023 16:32   DG Chest Portable 1 View  Result Date: 03/25/2023 CLINICAL DATA:  Bacteremia EXAM: PORTABLE CHEST 1 VIEW COMPARISON:  Chest x-ray dated March 24, 2023 FINDINGS: Cardiac and mediastinal contours are unchanged. Low lung volumes with hypoventilatory changes. Left retrocardiac opacity. No evidence of pleural effusion or pneumothorax. IMPRESSION: Left retrocardiac opacity, could be due to atelectasis, infection or aspiration. Electronically Signed   By: Allegra Lai M.D.   On: 03/25/2023 15:40   DG Chest Port 1 View  Result Date: 03/24/2023 CLINICAL DATA:  Sepsis.  Altered mental status.  High fall risk. EXAM: PORTABLE CHEST 1 VIEW COMPARISON:  11/07/2022 FINDINGS: Shallow inspiration. Heart size and pulmonary vascularity are normal. Peribronchial thickening with streaky  perihilar opacities suggesting acute or chronic bronchitis. No focal consolidation or airspace disease. No pleural effusions. No pneumothorax. Mediastinal contours appear intact. IMPRESSION: Shallow inspiration. Bronchitic changes in the lungs. No focal consolidation. Electronically Signed   By: Burman Nieves M.D.   On: 03/24/2023 03:32    Catarina Hartshorn, DO  [facility]  If 7PM-7AM, please contact night-coverage [email or web address] Password [email or web address] 03/27/2023, 5:36 PM   LOS: 2 days

## 2023-03-27 NOTE — Plan of Care (Signed)

## 2023-03-27 NOTE — Care Management Important Message (Signed)
Important Message  Patient Details  Name: [name] MRN: [id number] Date of Birth: 1945/07/08   Important Message Given:  N/A - LOS <3 / Initial given by admissions     [name] 03/27/2023, 3:07 PM

## 2023-03-27 NOTE — Telephone Encounter (Signed)
Post ED Visit - Positive Culture Follow-up  Culture report reviewed by antimicrobial stewardship pharmacist: [facility] [x]  Daylene Posey, Pharm.D. []  Celedonio Miyamoto, [address].D., BCPS AQ-ID []  Garvin Fila, Pharm.D., BCPS []  Georgina Pillion, Pharm.D., BCPS []  [place], [address].D., BCPS, AAHIVP []  Estella Husk, Pharm.D., BCPS, AAHIVP []  Lysle Pearl, PharmD, BCPS []  Phillips Climes, PharmD, BCPS []  Agapito Games, PharmD, BCPS []  Verlan Friends, PharmD []  Mervyn Gay, PharmD, BCPS []  Vinnie Level, PharmD  [facility] []  Len Childs, PharmD []  Greer Pickerel, PharmD []  Adalberto Cole, PharmD []  Perlie Gold, Rph []  Lonell Face) Jean Rosenthal, PharmD []  Earl Many, PharmD []  Junita Push, PharmD []  Dorna Leitz, PharmD []  Terrilee Files, PharmD []  Lynann Beaver, PharmD []  Keturah Barre, PharmD []  Loralee Pacas, PharmD []  Bernadene Person, PharmD   Positive blood culture Currently an inpt at [facility] and no further patient follow-up is required at this time.  Virl Axe [facility] 03/27/2023, 10:28 AM

## 2023-03-28 DIAGNOSIS — Z856 Personal history of leukemia: Secondary | ICD-10-CM

## 2023-03-28 DIAGNOSIS — F0390 Unspecified dementia without behavioral disturbance: Secondary | ICD-10-CM | POA: Diagnosis not present

## 2023-03-28 DIAGNOSIS — J181 Lobar pneumonia, unspecified organism: Secondary | ICD-10-CM | POA: Diagnosis not present

## 2023-03-28 DIAGNOSIS — A419 Sepsis, unspecified organism: Secondary | ICD-10-CM | POA: Diagnosis not present

## 2023-03-28 LAB — CBC
HCT: 40.8 % (ref 39.0–52.0)
Hemoglobin: 12.7 g/dL — ABNORMAL LOW (ref 13.0–17.0)
MCH: 29.7 pg (ref 26.0–34.0)
MCHC: 31.1 g/dL (ref 30.0–36.0)
MCV: 95.6 fL (ref 80.0–100.0)
Platelets: 171 10*3/uL (ref 150–400)
RBC: 4.27 MIL/uL (ref 4.22–5.81)
RDW: 14.4 % (ref 11.5–15.5)
WBC: 22.3 10*3/uL — ABNORMAL HIGH (ref 4.0–10.5)
nRBC: 0 % (ref 0.0–0.2)

## 2023-03-28 LAB — BASIC METABOLIC PANEL
Anion gap: 5 (ref 5–15)
BUN: 13 mg/dL (ref 8–23)
CO2: 22 mmol/L (ref 22–32)
Calcium: 7.8 mg/dL — ABNORMAL LOW (ref 8.9–10.3)
Chloride: 109 mmol/L (ref 98–111)
Creatinine, Ser: 1.24 mg/dL (ref 0.61–1.24)
GFR, Estimated: 60 mL/min — ABNORMAL LOW (ref >60)
Glucose, Bld: 120 mg/dL — ABNORMAL HIGH (ref 70–99)
Potassium: 3.6 mmol/L (ref 3.5–5.1)
Sodium: 136 mmol/L (ref 135–145)

## 2023-03-28 LAB — MAGNESIUM: Magnesium: 1.9 mg/dL (ref 1.7–2.4)

## 2023-03-28 MED ORDER — CEFDINIR 300 MG PO CAPS: 300.0000 mg | ORAL_CAPSULE | Freq: Two times a day (BID) | ORAL | 0 refills | Status: DC

## 2023-03-28 MED ORDER — AZITHROMYCIN 250 MG PO TABS
500.0000 mg | ORAL_TABLET | Freq: Every day | ORAL | Status: DC
  Administered 2023-03-28: 500 mg via ORAL
  Filled 2023-03-28: qty 2

## 2023-03-28 MED ORDER — AZITHROMYCIN 500 MG PO TABS: 500.0000 mg | ORAL_TABLET | Freq: Every day | ORAL | 0 refills | Status: DC

## 2023-03-28 MED ORDER — CEFDINIR 300 MG PO CAPS: 300.0000 mg | ORAL_CAPSULE | Freq: Two times a day (BID) | ORAL | Status: DC

## 2023-03-28 NOTE — Discharge Summary (Signed)
Physician Discharge Summary   Patient: [name] MRN: [id number] DOB: [birth year]  Admit date:     03/25/2023  Discharge date: 03/28/23  Discharge Physician: Onalee Hua Dom [place]   PCP: Benita Stabile, MD   Recommendations at discharge:   Please follow up with primary care provider within 1-2 weeks  Please repeat BMP and CBC in one week    Hospital Course: 77 year old male with a history of coronary artery disease, CKD stage III, CLL, HFrEF, dementia, hypertension, hyperlipidemia, GERD presenting with falls, generalized weakness, and decreased oral intake.  The patient is unable to provide a significant history secondary to his dementia.  History is obtained from review of medical record and speaking with the patient and spouse at the bedside.  The patient has had decreased oral intake and generalized weakness for about a week.  He has been complaining of some suprapubic discomfort and pain and dysuria.  There is been no complaints of fever, chills, headache, chest pain, shortness breath, vomiting, diarrhea, hematochezia, melena.  The patient has been complaining of some suprapubic discomfort for about a week.  The patient was brought to the emergency department on 03/23/2023 due to the above symptoms.  He had a temperature of 100.7.  Workup at that time revealed serum creatinine 1.3, UA>50 RBC, lactic acid 1.0, and negative chest x-ray.  The patient was discharged home in stable condition.  The patient returned back to the emergency department on 03/25/2023 when he was contacted for positive blood culture.  Wife states that since returning home he had a fever up to 103.0 F and generalized weakness continued.  He complained of some myalgias and arthralgias.  He had nonproductive cough.  In the ED, the patient had a temperature of 100.0 F.  He was hemodynamically stable with oxygen saturation of 95-96% on room air.  WBC 29.0, hemoglobin 14.7, platelets 120.  Sodium 132, potassium 4.3, bicarbonate 22,  serum creatinine 1.77.  The patient was started on IV fluids, ceftriaxone, azithromycin.    Assessment and Plan: Sepsis -Patient presented with fever, leukocytosis -due to pneumonia -Personally reviewed chest x-ray--poor inspiration, increased bronchitic markings -Lactic acid 1.4>> 1.8>> 1.3 -03/26/2023 PCT 0.16 -started on empiric ceftriaxone and azithromycin -Follow blood cultures--neg to date -initially on IV fluids -sepsis physiology resolved -pt has chronicall elevated WBC--now near baseline   Lobar pneumonia -10/20 CT chest--peribronchovascular consolidation, RLL>>LLL -continue ceftriaxone and azithro -d/c home with cefdinir and azithro x 4 more days   Bacteremia -represents contaminant -03/24/2023 blood culture--S. capitis -03/24/2023 multiplex PCR--staph species -03/25/23 blood cultures--neg   Abdominal pain -CT abdomen and pelvis--no acute finding -UA no WBC -likely due to intermittent urine retention and bladder distension at home -improved at pt was able to urinate on his own   Acute on chronic renal failure-CKD stage IIIa -Baseline creatinine 1.4-1.5 -Presented with serum creatinine 1.84 -Continue IV fluids>>improved -hold lasix--restart after d/c on MWF -serum creatinine 1.24 on day of d/c   CLL -Patient follows up with Dr. Ellin Saba -Originally diagnosed 2008 -On watchful waiting since then -[facility]   History of DVT -Diagnosed with DVT 05/2019 -IVC filter placed on 05/27/2019 -ASA not started secondary to GI bleed due to diverticulosis   Dementia without behavioral disturbance -Continue Namenda -Wife endorses gradual cognitive decline   Coronary artery disease -No chest pain presently -CAD S/P PCI-- RCA DES 2014, CFX DES Jan 2017  -Continue Plavix   Essential hypertension -Holding lisinopril secondary to AKI -Normally takes lisinopril 2.5 mg on  Tuesdays, Thursday, and Sundays and alternates with furosemide 20 mg  on Mondays, Wednesday, Fridays -Hold furosemide   Chronic HFimpEF -06/08/2015 echo EF 35-40%, grade 1 DD, positive WMA -10/05/2021 echo EF 55-60%, no WMA, normal RVF -Clinically dry--started on IVF initially   Mixed hyperlipidemia -Previously on statin, recently stopped 02/22/2023 after seeing Dr. Harding          Consultants: none Procedures performed: none  Disposition: Home Diet recommendation:  Regular diet DISCHARGE MEDICATION: Allergies as of 03/28/2023   No Known Allergies      Medication List     TAKE these medications    atorvastatin 40 MG tablet Commonly known as: LIPITOR Take 1 tablet (40 mg total) by mouth daily.   azithromycin 500 MG tablet Commonly known as: ZITHROMAX Take 1 tablet (500 mg total) by mouth daily.   cefdinir 300 MG capsule Commonly known as: OMNICEF Take 1 capsule (300 mg total) by mouth every 12 (twelve) hours.   clopidogrel 75 MG tablet Commonly known as: PLAVIX TAKE 1 TABLET BY MOUTH EVERY DAY   [employer] Vitamin B12 1000 MCG Tbcr Generic drug: Cyanocobalamin TAKE 1 TABLET BY MOUTH EVERY DAY   furosemide 20 MG tablet Commonly known as: LASIX Take 1 tablet (20 mg total) by mouth every Monday, Wednesday, and Friday.   ketoconazole 2 % cream Commonly known as: NIZORAL Apply 1 Application topically daily as needed for irritation.   lisinopril 2.5 MG tablet Commonly known as: ZESTRIL Take 1 tablet (2.5 mg total) by mouth every Tuesday, Thursday, Saturday, and Sunday.   memantine 5 MG tablet Commonly known as: NAMENDA Take 5 mg by mouth 2 (two) times daily.   nitroGLYCERIN 0.4 MG SL tablet Commonly known as: NITROSTAT Place 0.4 mg under the tongue every 5 (five) minutes as needed for chest pain.   ondansetron 4 MG disintegrating tablet Commonly known as: ZOFRAN-ODT Take 1 tablet (4 mg total) by mouth every 8 (eight) hours as needed for nausea or vomiting.   pantoprazole 40 MG tablet Commonly known as: PROTONIX TAKE 1 TABLET  BY MOUTH EVERY DAY               Durable Medical Equipment  (From admission, onward)           Start     Ordered   03/27/23 1746  For home use only DME Hospital bed  Once       Question Answer Comment  Length of Need Lifetime   The above medical condition requires: Patient requires the ability to reposition frequently   Bed type Semi-electric      10 /21/24 1745            Follow-up Information     [facility].   Specialty: Home Health Services Why: [facility] staff will call you to schedule in home visits Contact information: [address] [place] [address] [phone]                Discharge Exam: Ceasar Mons Weights   03/25/23 2132 03/26/23 0600 03/27/23 0252  Weight: 73.6 kg 73.6 kg 77.6 kg   HEENT:  [place]/AT, No thrush, no icterus CV:  RRR, no rub, no S3, no S4 Lung:  bilateral rales R>L Abd:  soft/+BS, NT Ext:  No edema, no lymphangitis, no synovitis, no rash   Condition at discharge: stable  The results of significant diagnostics from this hospitalization (including imaging, microbiology, ancillary and laboratory) are listed below for reference.  Imaging Studies: CT CHEST ABDOMEN PELVIS WO CONTRAST  Result Date: 03/26/2023 CLINICAL DATA:  Shortness of breath fever EXAM: CT CHEST, ABDOMEN AND PELVIS WITHOUT CONTRAST TECHNIQUE: Multidetector CT imaging of the chest, abdomen and pelvis was performed following the standard protocol without IV contrast. RADIATION DOSE REDUCTION: This exam was performed according to the departmental dose-optimization program which includes automated exposure control, adjustment of the mA and/or kV according to patient size and/or use of iterative reconstruction technique. COMPARISON:  CT abdomen and pelvis dated August 15, 2022 FINDINGS: CT CHEST FINDINGS Cardiovascular: Normal heart size. No pericardial effusion. Normal caliber thoracic aorta with mild atherosclerotic disease. Severe  coronary artery calcifications. Mediastinum/Nodes: Small hiatal hernia. Thyroid is unremarkable. Mildly enlarged subcarinal lymph node measuring 12 mm in short axis on series 2 image 26, likely reactive. Lungs/Pleura: Central airways are patent. Evaluation of the lung parenchyma is somewhat limited due to motion artifact. Peribronchovascular consolidations of the right-greater-than-left lower lobes. Small right pleural effusion. Musculoskeletal: No chest wall mass or suspicious bone lesions identified. CT ABDOMEN PELVIS FINDINGS Hepatobiliary: No focal liver abnormality is seen. Gallstones. No gallbladder wall thickening. No biliary ductal dilation. Pancreas: Unremarkable. No pancreatic ductal dilatation or surrounding inflammatory changes. Spleen: Normal in size without focal abnormality. Adrenals/Urinary Tract: Bilateral adrenal glands are unremarkable. No hydronephrosis. Simple appearing cyst of the pole of the left kidney, unchanged when compared with the prior. Bladder trabeculations. Stomach/Bowel: Stomach is within normal limits. Appendix appears normal. Diverticulosis. No evidence of bowel wall thickening, distention, or inflammatory changes. Vascular/Lymphatic: Aortic atherosclerosis. Infrarenal IVC filter, unchanged when compared with the prior. No enlarged abdominal or pelvic lymph nodes. Reproductive: Unchanged marked prostatomegaly. Other: No abdominopelvic ascites.  No intra-abdominal free air. Musculoskeletal: No acute or significant osseous findings. IMPRESSION: 1. Peribronchovascular consolidations of the right-greater-than-left lower lobes, likely due to aspiration or infection. 2. Small right pleural effusion. 3. No acute findings in the abdomen or pelvis. 4. Bladder trabeculations and marked prostatomegaly, consistent with chronic outlet obstruction. 5. Coronary artery calcifications and aortic Atherosclerosis (ICD10-I70.0). Electronically Signed   By: Allegra Lai M.D.   On: 03/26/2023 16:32    DG Chest Portable 1 View  Result Date: 03/25/2023 CLINICAL DATA:  Bacteremia EXAM: PORTABLE CHEST 1 VIEW COMPARISON:  Chest x-ray dated March 24, 2023 FINDINGS: Cardiac and mediastinal contours are unchanged. Low lung volumes with hypoventilatory changes. Left retrocardiac opacity. No evidence of pleural effusion or pneumothorax. IMPRESSION: Left retrocardiac opacity, could be due to atelectasis, infection or aspiration. Electronically Signed   By: Allegra Lai M.D.   On: 03/25/2023 15:40   DG Chest Port 1 View  Result Date: 03/24/2023 CLINICAL DATA:  Sepsis.  Altered mental status.  High fall risk. EXAM: PORTABLE CHEST 1 VIEW COMPARISON:  11/07/2022 FINDINGS: Shallow inspiration. Heart size and pulmonary vascularity are normal. Peribronchial thickening with streaky perihilar opacities suggesting acute or chronic bronchitis. No focal consolidation or airspace disease. No pleural effusions. No pneumothorax. Mediastinal contours appear intact. IMPRESSION: Shallow inspiration. Bronchitic changes in the lungs. No focal consolidation. Electronically Signed   By: Burman Nieves M.D.   On: 03/24/2023 03:32    Microbiology: Results for orders placed or performed during the hospital encounter of 03/25/23  Blood Culture (routine x 2)     Status: None (Preliminary result)   Collection Time: 03/25/23  3:22 PM   Specimen: BLOOD  Result Value Ref Range Status   Specimen Description BLOOD RIGHT ANTECUBITAL  Final   Special Requests   Final    BOTTLES  DRAWN AEROBIC AND ANAEROBIC Blood Culture adequate volume   Culture   Final    NO GROWTH 3 DAYS Performed at [facility], [address] [place], [place] [address]    Report Status PENDING  Incomplete  Blood Culture (routine x 2)     Status: None (Preliminary result)   Collection Time: 03/25/23  3:54 PM   Specimen: BLOOD  Result Value Ref Range Status   Specimen Description BLOOD RAC  Final   Special Requests   Final    BOTTLES DRAWN AEROBIC  AND ANAEROBIC Blood Culture adequate volume   Culture   Final    NO GROWTH 3 DAYS Performed at [facility], [address] [place], [place] [address]    Report Status PENDING  Incomplete  MRSA Next Gen by PCR, Nasal     Status: None   Collection Time: 03/25/23  8:23 PM   Specimen: Nasal Mucosa; Nasal Swab  Result Value Ref Range Status   MRSA by PCR Next Gen NOT DETECTED NOT DETECTED Final    Comment: (NOTE) The GeneXpert MRSA Assay (FDA approved for NASAL specimens only), is one component of a comprehensive MRSA colonization surveillance program. It is not intended to diagnose MRSA infection nor to guide or monitor treatment for MRSA infections. Test performance is not FDA approved in patients less than 41 years old. Performed at [facility], [address] [place], [place] [address]   SARS Coronavirus 2 by RT PCR (hospital order, performed in [facility] hospital lab) *cepheid single result test* Anterior Nasal Swab     Status: None   Collection Time: 03/26/23  9:06 AM   Specimen: Anterior Nasal Swab  Result Value Ref Range Status   SARS Coronavirus 2 by RT PCR NEGATIVE NEGATIVE Final    Comment: (NOTE) SARS-CoV-2 target nucleic acids are NOT DETECTED.  The SARS-CoV-2 RNA is generally detectable in upper and lower respiratory specimens during the acute phase of infection. The lowest concentration of SARS-CoV-2 viral copies this assay can detect is 250 copies / mL. A negative result does not preclude SARS-CoV-2 infection and should not be used as the sole basis for treatment or other patient management decisions.  A negative result may occur with improper specimen collection / handling, submission of specimen other than nasopharyngeal swab, presence of viral mutation(s) within the areas targeted by this assay, and inadequate number of viral copies (<250 copies / mL). A negative result must be combined with clinical observations, patient history, and epidemiological  information.  Fact Sheet for Patients:   RoadLapTop.co.za  Fact Sheet for Healthcare Providers: http://kim-miller.com/  This test is not yet approved or  cleared by the [place] FDA and has been authorized for detection and/or diagnosis of SARS-CoV-2 by FDA under an Emergency Use Authorization (EUA).  This EUA will remain in effect (meaning this test can be used) for the duration of the COVID-19 declaration under Section 564(b)(1) of the Act, 21 U.S.C. section 360bbb-3(b)(1), unless the authorization is terminated or revoked sooner.  Performed at [facility], [address] [place], [place] [address]   Respiratory (~20 pathogens) panel by PCR     Status: None   Collection Time: 03/26/23 12:00 PM   Specimen: Nasopharyngeal Swab; Respiratory  Result Value Ref Range Status   Adenovirus NOT DETECTED NOT DETECTED Final   Coronavirus 229E NOT DETECTED NOT DETECTED Final    Comment: (NOTE) The Coronavirus on the Respiratory Panel, DOES NOT test for the novel  Coronavirus (2019 nCoV)  Coronavirus HKU1 NOT DETECTED NOT DETECTED Final   Coronavirus NL63 NOT DETECTED NOT DETECTED Final   Coronavirus OC43 NOT DETECTED NOT DETECTED Final   Metapneumovirus NOT DETECTED NOT DETECTED Final   Rhinovirus / Enterovirus NOT DETECTED NOT DETECTED Final   Influenza A NOT DETECTED NOT DETECTED Final   Influenza B NOT DETECTED NOT DETECTED Final   Parainfluenza Virus 1 NOT DETECTED NOT DETECTED Final   Parainfluenza Virus 2 NOT DETECTED NOT DETECTED Final   Parainfluenza Virus 3 NOT DETECTED NOT DETECTED Final   Parainfluenza Virus 4 NOT DETECTED NOT DETECTED Final   Respiratory Syncytial Virus NOT DETECTED NOT DETECTED Final   Bordetella pertussis NOT DETECTED NOT DETECTED Final   Bordetella Parapertussis NOT DETECTED NOT DETECTED Final   Chlamydophila pneumoniae NOT DETECTED NOT DETECTED Final   Mycoplasma pneumoniae NOT DETECTED NOT DETECTED  Final    Comment: Performed at [facility], [address], [place], [place] [address]  Expectorated Sputum Assessment w Gram Stain, Rflx to Resp Cult     Status: None   Collection Time: 03/27/23  1:23 PM   Specimen: Sputum  Result Value Ref Range Status   Specimen Description SPU  Final   Special Requests NONE  Final   Sputum evaluation   Final    THIS SPECIMEN IS ACCEPTABLE FOR SPUTUM CULTURE Performed at [facility], [address] [place], [place] [address]    Report Status 03/27/2023 FINAL  Final  Culture, Respiratory w Gram Stain     Status: None (Preliminary result)   Collection Time: 03/27/23  1:23 PM   Specimen: Sputum  Result Value Ref Range Status   Specimen Description   Final    SPU Performed at [facility], [address] [place], [place] [address]    Special Requests   Final    NONE Reflexed from [id number] Performed at [facility], [address], [place], [place] [address]    Gram Stain   Final    RARE WBC PRESENT, PREDOMINANTLY PMN RARE GRAM POSITIVE COCCI    Culture   Final    CULTURE REINCUBATED FOR BETTER GROWTH Performed at [facility], [address], [place], [place] [address]    Report Status PENDING  Incomplete    Labs: CBC: Recent Labs  Lab 03/23/23 2145 03/25/23 1407 03/26/23 0328 03/27/23 0419 03/28/23 0432  WBC 29.8* 29.0* 25.3* 23.4* 22.3*  NEUTROABS  --  5.8  --   --   --   HGB 14.8 14.7 13.3 12.7* 12.7*  HCT 47.8 46.6 42.4 41.2 40.8  MCV 97.6 97.5 96.8 96.7 95.6  PLT 187 170 147* 160 171   Basic Metabolic Panel: Recent Labs  Lab 03/23/23 2145 03/25/23 1407 03/26/23 0328 03/27/23 0419 03/28/23 0432  NA 136 132* 132* 135 136  K 4.3 4.3 4.3 3.9 3.6  CL 102 101 102 105 109  CO2 25 22 22 24 22   GLUCOSE 127* 118* 112* 120* 120*  BUN 12 15 15 15 13   CREATININE 1.83* 1.84* 1.77* 1.50* 1.24  CALCIUM 8.7* 8.3* 7.9* 7.9* 7.8*  MG  --   --   --  1.8 1.9   Liver Function Tests: Recent Labs  Lab 03/23/23 2145  03/25/23 1554 03/26/23 0328  AST 24 23 21   ALT 15 16 11   ALKPHOS 86 73 64  BILITOT 1.5* 2.2* 1.9*  PROT 6.6 7.0 5.4*  ALBUMIN 3.9 3.9 3.0*   CBG: Recent Labs  Lab 03/24/23 0257  GLUCAP 105*    Discharge time spent: greater than 30 minutes.  Signed: Catarina Hartshorn, MD [facility] 03/28/2023

## 2023-03-28 NOTE — Progress Notes (Signed)
Wife updated about delay for convo transport. She reports she does not want to stay another night so she will try to call for a ride home for her and patient. Pt denies needs at this time. He appears comfortable. He is pleasant and interactive with this RN. He is oriented to age and that his birthday is tomorrow. He is disoriented to time, place, and situation. Lung sounds clear bilaterally. [place] RN

## 2023-03-28 NOTE — Progress Notes (Signed)
Called EMS at 1653 to try and get an ETA for transport.  Dispatch voiced they were unable to give an ETA at this time as they had been doing multiple emergent runs since last conversation with them.  Spouse informed and voices understanding.  This nurse encouraged spouse to call her ride and go home as she has repeatedly voiced to staff through the day she needed to get home so "they will deliver his bed to the house" however she refuses to call ride and voices she will wait for now.

## 2023-03-28 NOTE — Consult Note (Signed)
[facility] Customer service manager [facility]) Accountable Care Organization (ACO) [facility] Liaison Note  03/28/2023  [name] 12/11/1945 [id number]  Location: [facility] RN Hospital Liaison screened the patient remotely at [facility].  Insurance: [employer] Advantage   [name] is a 77 y.o. male who is a Primary Care Patient of Margo Aye, Kathleene Hazel, MD. The patient was screened for  day readmission hospitalization with noted medium risk score for unplanned readmission risk with 1 IP/2 ED in 6 months.  The patient was assessed for potential [facility]) Care Management service needs for post hospital transition for care coordination. Review of patient's electronic medical record reveals patient was admitted for Sepsis. Provider's office will provide pt's TOC needs upon his discharge.   [facility] Care Management/Population Health does not replace or interfere with any arrangements made by the Inpatient Transition of Care team.   For questions contact:   Elliot Cousin, RN, [facility] Liaison [place]   [facility], Population Health Office Hours MTWF  8:00 am-6:00 pm Direct Dial: [phone] mobile [phone] [Office toll free line] Office Hours are M-F 8:30 - 5 pm [email or web address]

## 2023-03-28 NOTE — TOC Transition Note (Signed)
Transition of Care [facility]) - CM/SW Discharge Note   Patient Details  Name: [name] MRN: [id number] Date of Birth: 1945-06-14  Transition of Care [facility]) CM/SW Contact:  Elliot Gault, LCSW Phone Number: 03/28/2023, 10:45 AM   Clinical Narrative:    Pt medically stable for dc per MD. PT recommending HHPT and pt/wife requesting hospital bed at home. Spoke with pt's wife to review dc planning. She states that pt will need ambulance transport home. She would like a hospital bed for him and she would like HH. CMS providers reviewed for DME and HH. Referred as requested.  No other TOC needs for dc.   Final next level of care: Home w Home Health Services Barriers to Discharge: Barriers Resolved   Patient Goals and CMS Choice CMS Medicare.gov Compare Post Acute Care list provided to:: Patient Represenative (must comment) Choice offered to / list presented to : Spouse  Discharge Placement                         Discharge Plan and Services Additional resources added to the After Visit Summary for   In-house Referral: Clinical Social Work   Post Acute Care Choice: Durable Medical Equipment, Home Health          DME Arranged: Hospital bed DME Agency: [employer] Date DME Agency Contacted: 03/28/23   Representative spoke with at DME Agency: Ian Malkin HH Arranged: RN, PT [facility] Agency: [facility] Date [facility] Agency Contacted: 03/28/23   Representative spoke with at [facility] Agency: Clifton Custard  Social Determinants of Health (SDOH) Interventions SDOH Screenings   Food Insecurity: No Food Insecurity (03/25/2023)  Housing: Low Risk  (03/25/2023)  Transportation Needs: No Transportation Needs (03/25/2023)  Utilities: Not At Risk (03/25/2023)  Depression (PHQ2-9): Low Risk  (12/09/2020)  Tobacco Use: Medium Risk (03/25/2023)     Readmission Risk Interventions     No data to display

## 2023-03-28 NOTE — Progress Notes (Signed)
Patient requires frequent re-positioning of the body in ways that cannot be achieved with an ordinary bed or wedge pillow, to eliminate pain, reduce pressure, and the head of the bed to be elevated more than 30 degrees most of the time due to CHF

## 2023-03-28 NOTE — Evaluation (Signed)
Physical Therapy Evaluation Patient Details Name: [name] MRN: [id number] DOB: Dec 26, 1945 Today's Date: 03/28/2023  History of Present Illness  [name] is a 77 y.o. male with medical history significant of CAD, CKD3, CLL (in remission), CHF, dementia, GERD, HLD, IDA who was advised to present to the ED due to a positive blood culture. Patient has dementia and not completely oriented to situation so history was obtained from spouse. Per spouse, patient presented to the ED yesterday due to generalized weakness and subjective fevers and chills. Patient was discharged home from the ED due to relatively normal labs at that time. His blood cultures came back positive for staph species and was informed to return to the ED.  Per spouse, patient continued to feels weak and fell out of his bed in the morning but did not hit his head or lose consciousness. He also had a Tmax of 103 at home and has been coughing.   Clinical Impression  Patient demonstrates labored movement for sitting up at bedside, very unsteady on feet when transferring without an AD, had to lean on armrest of chair for support with buckling of knees, safer using RW and able to ambulate to bathroom to have a BM and urinate.  Patient requires repeated verbal/cueing for following instruction and tolerated sitting up in chair after therapy.  Patient's spouse given gait belt to use at home.  PLAN:  Patient to be discharged home today and discharged from acute physical therapy to care of nursing for ambulation as tolerated for length of stay with recommendations stated below          If plan is discharge home, recommend the following: A lot of help with walking and/or transfers;A little help with bathing/dressing/bathroom;Help with stairs or ramp for entrance;Assistance with cooking/housework   Can travel by private vehicle        Equipment Recommendations None recommended by PT  Recommendations for Other Services        Functional Status Assessment Patient has had a recent decline in their functional status and demonstrates the ability to make significant improvements in function in a reasonable and predictable amount of time.     Precautions / Restrictions Precautions Precautions: Fall Restrictions Weight Bearing Restrictions: No      Mobility  Bed Mobility Overal bed mobility: Needs Assistance Bed Mobility: Supine to Sit     Supine to sit: Min assist     General bed mobility comments: increased time, labored movement    Transfers Overall transfer level: Needs assistance   Transfers: Sit to/from Stand, Bed to chair/wheelchair/BSC Sit to Stand: Min assist   Step pivot transfers: Min assist       General transfer comment: unsteady labored movement, frequent verbal cueing for safety    Ambulation/Gait Ambulation/Gait assistance: Min assist Gait Distance (Feet): 16 Feet Assistive device: Rolling walker (2 wheels) Gait Pattern/deviations: Decreased step length - left, Decreased stance time - right, Decreased stride length, Trunk flexed Gait velocity: slow     General Gait Details: slow labored cadence without loss of balance, requires frequent repeated verbal/tactile cueing for following directions  Stairs            Wheelchair Mobility     Tilt Bed    Modified Rankin (Stroke Patients Only)       Balance Overall balance assessment: Needs assistance Sitting-balance support: Feet supported, No upper extremity supported Sitting balance-Leahy Scale: Fair Sitting balance - Comments: fair/good seated at EOB   Standing balance support: Reliant  on assistive device for balance, During functional activity, Bilateral upper extremity supported Standing balance-Leahy Scale: Fair Standing balance comment: using RW                             Pertinent Vitals/Pain Pain Assessment Pain Assessment: No/denies pain    Home Living Family/patient expects to be  discharged to:: Private residence Living Arrangements: Spouse/significant other Available Help at Discharge: Family;Available 24 hours/day Type of Home: House Home Access: Stairs to enter Entrance Stairs-Rails: Right Entrance Stairs-Number of Steps: 2   Home Layout: Other (Comment);Two level;Able to live on main level with bedroom/bathroom Home Equipment: Rolling Walker (2 wheels)      Prior Function Prior Level of Function : Needs assist       Physical Assist : Mobility (physical);ADLs (physical) Mobility (physical): Bed mobility;Transfers;Gait;Stairs   Mobility Comments: household and short distanced community using RW ADLs Comments: Assisted by family     Extremity/Trunk Assessment   Upper Extremity Assessment Upper Extremity Assessment: Overall WFL for tasks assessed    Lower Extremity Assessment Lower Extremity Assessment: Generalized weakness    Cervical / Trunk Assessment Cervical / Trunk Assessment: Kyphotic  Communication   Communication Communication: No apparent difficulties Cueing Techniques: Verbal cues;Tactile cues  Cognition Arousal: Alert Behavior During Therapy: WFL for tasks assessed/performed Overall Cognitive Status: History of cognitive impairments - at baseline                                          General Comments      Exercises     Assessment/Plan    PT Assessment All further PT needs can be met in the next venue of care  PT Problem List Decreased strength;Decreased activity tolerance;Decreased balance;Decreased mobility       PT Treatment Interventions      PT Goals (Current goals can be found in the Care Plan section)  Acute Rehab PT Goals Patient Stated Goal: return home with family to assist PT Goal Formulation: With patient/family Time For Goal Achievement: 03/28/23 Potential to Achieve Goals: Good    Frequency       Co-evaluation               AM-PAC PT "6 Clicks" Mobility  Outcome  Measure Help needed turning from your back to your side while in a flat bed without using bedrails?: None Help needed moving from lying on your back to sitting on the side of a flat bed without using bedrails?: A Little Help needed moving to and from a bed to a chair (including a wheelchair)?: A Little Help needed standing up from a chair using your arms (e.g., wheelchair or bedside chair)?: A Little Help needed to walk in hospital room?: A Lot Help needed climbing 3-5 steps with a railing? : A Lot 6 Click Score: 17    End of Session   Activity Tolerance: Patient tolerated treatment well;Patient limited by fatigue Patient left: in chair;with call bell/phone within reach;with family/visitor present Nurse Communication: Mobility status PT Visit Diagnosis: Unsteadiness on feet (R26.81);Other abnormalities of gait and mobility (R26.89);Muscle weakness (generalized) (M62.81)    Time: 1191-4782 PT Time Calculation (min) (ACUTE ONLY): 27 min   Charges:   PT Evaluation $PT Eval Moderate Complexity: 1 Mod PT Treatments $Therapeutic Activity: 23-37 mins PT General Charges $$ ACUTE PT VISIT: 1 Visit  12:16 PM, 03/28/23 Ocie Bob, MPT Physical Therapist with [facility] [phone] office [phone] mobile phone

## 2023-03-28 NOTE — Progress Notes (Signed)
Patient's spouse is upset and yelling at staff plus threatening to "call [phone] because I know it doesn't take this long for them to come get him".  EMS was called at 1321 for non emergent transfer back to home, given patient's address, and verbalized they would put him on the list.  Attempted to explain that to spouse and she voiced that she didn't think it should take that long for them to come and transport him to home.

## 2023-03-29 DIAGNOSIS — J181 Lobar pneumonia, unspecified organism: Secondary | ICD-10-CM | POA: Diagnosis not present

## 2023-03-29 DIAGNOSIS — R6510 Systemic inflammatory response syndrome (SIRS) of non-infectious origin without acute organ dysfunction: Secondary | ICD-10-CM | POA: Diagnosis not present

## 2023-03-29 DIAGNOSIS — I5022 Chronic systolic (congestive) heart failure: Secondary | ICD-10-CM | POA: Diagnosis not present

## 2023-03-29 DIAGNOSIS — C9110 Chronic lymphocytic leukemia of B-cell type not having achieved remission: Secondary | ICD-10-CM | POA: Diagnosis not present

## 2023-03-29 LAB — CULTURE, BLOOD (ROUTINE X 2): Culture: NO GROWTH

## 2023-03-30 DIAGNOSIS — I700 Atherosclerosis of aorta: Secondary | ICD-10-CM | POA: Diagnosis not present

## 2023-03-30 DIAGNOSIS — K219 Gastro-esophageal reflux disease without esophagitis: Secondary | ICD-10-CM | POA: Diagnosis not present

## 2023-03-30 DIAGNOSIS — D500 Iron deficiency anemia secondary to blood loss (chronic): Secondary | ICD-10-CM | POA: Diagnosis not present

## 2023-03-30 DIAGNOSIS — I252 Old myocardial infarction: Secondary | ICD-10-CM | POA: Diagnosis not present

## 2023-03-30 DIAGNOSIS — I255 Ischemic cardiomyopathy: Secondary | ICD-10-CM | POA: Diagnosis not present

## 2023-03-30 DIAGNOSIS — Z7902 Long term (current) use of antithrombotics/antiplatelets: Secondary | ICD-10-CM | POA: Diagnosis not present

## 2023-03-30 DIAGNOSIS — I2510 Atherosclerotic heart disease of native coronary artery without angina pectoris: Secondary | ICD-10-CM | POA: Diagnosis not present

## 2023-03-30 DIAGNOSIS — N179 Acute kidney failure, unspecified: Secondary | ICD-10-CM | POA: Diagnosis not present

## 2023-03-30 DIAGNOSIS — F32A Depression, unspecified: Secondary | ICD-10-CM | POA: Diagnosis not present

## 2023-03-30 DIAGNOSIS — R519 Headache, unspecified: Secondary | ICD-10-CM | POA: Diagnosis not present

## 2023-03-30 DIAGNOSIS — N39498 Other specified urinary incontinence: Secondary | ICD-10-CM | POA: Diagnosis not present

## 2023-03-30 DIAGNOSIS — J181 Lobar pneumonia, unspecified organism: Secondary | ICD-10-CM | POA: Diagnosis not present

## 2023-03-30 DIAGNOSIS — K5790 Diverticulosis of intestine, part unspecified, without perforation or abscess without bleeding: Secondary | ICD-10-CM | POA: Diagnosis not present

## 2023-03-30 DIAGNOSIS — N401 Enlarged prostate with lower urinary tract symptoms: Secondary | ICD-10-CM | POA: Diagnosis not present

## 2023-03-30 DIAGNOSIS — F0393 Unspecified dementia, unspecified severity, with mood disturbance: Secondary | ICD-10-CM | POA: Diagnosis not present

## 2023-03-30 DIAGNOSIS — C9111 Chronic lymphocytic leukemia of B-cell type in remission: Secondary | ICD-10-CM | POA: Diagnosis not present

## 2023-03-30 DIAGNOSIS — Z955 Presence of coronary angioplasty implant and graft: Secondary | ICD-10-CM | POA: Diagnosis not present

## 2023-03-30 DIAGNOSIS — R1310 Dysphagia, unspecified: Secondary | ICD-10-CM | POA: Diagnosis not present

## 2023-03-30 DIAGNOSIS — I5042 Chronic combined systolic (congestive) and diastolic (congestive) heart failure: Secondary | ICD-10-CM | POA: Diagnosis not present

## 2023-03-30 DIAGNOSIS — N1832 Chronic kidney disease, stage 3b: Secondary | ICD-10-CM | POA: Diagnosis not present

## 2023-03-30 DIAGNOSIS — F4310 Post-traumatic stress disorder, unspecified: Secondary | ICD-10-CM | POA: Diagnosis not present

## 2023-03-30 DIAGNOSIS — R21 Rash and other nonspecific skin eruption: Secondary | ICD-10-CM | POA: Diagnosis not present

## 2023-03-30 DIAGNOSIS — E782 Mixed hyperlipidemia: Secondary | ICD-10-CM | POA: Diagnosis not present

## 2023-03-30 DIAGNOSIS — I130 Hypertensive heart and chronic kidney disease with heart failure and stage 1 through stage 4 chronic kidney disease, or unspecified chronic kidney disease: Secondary | ICD-10-CM | POA: Diagnosis not present

## 2023-03-30 LAB — CULTURE, BLOOD (ROUTINE X 2)
Special Requests: ADEQUATE
Special Requests: ADEQUATE

## 2023-03-30 LAB — CULTURE, RESPIRATORY W GRAM STAIN: Culture: NORMAL

## 2023-03-31 DIAGNOSIS — I130 Hypertensive heart and chronic kidney disease with heart failure and stage 1 through stage 4 chronic kidney disease, or unspecified chronic kidney disease: Secondary | ICD-10-CM | POA: Diagnosis not present

## 2023-03-31 DIAGNOSIS — N401 Enlarged prostate with lower urinary tract symptoms: Secondary | ICD-10-CM | POA: Diagnosis not present

## 2023-03-31 DIAGNOSIS — D500 Iron deficiency anemia secondary to blood loss (chronic): Secondary | ICD-10-CM | POA: Diagnosis not present

## 2023-03-31 DIAGNOSIS — I255 Ischemic cardiomyopathy: Secondary | ICD-10-CM | POA: Diagnosis not present

## 2023-03-31 DIAGNOSIS — C9111 Chronic lymphocytic leukemia of B-cell type in remission: Secondary | ICD-10-CM | POA: Diagnosis not present

## 2023-03-31 DIAGNOSIS — I2510 Atherosclerotic heart disease of native coronary artery without angina pectoris: Secondary | ICD-10-CM | POA: Diagnosis not present

## 2023-03-31 DIAGNOSIS — R519 Headache, unspecified: Secondary | ICD-10-CM | POA: Diagnosis not present

## 2023-03-31 DIAGNOSIS — N39498 Other specified urinary incontinence: Secondary | ICD-10-CM | POA: Diagnosis not present

## 2023-03-31 DIAGNOSIS — F0393 Unspecified dementia, unspecified severity, with mood disturbance: Secondary | ICD-10-CM | POA: Diagnosis not present

## 2023-03-31 DIAGNOSIS — R1310 Dysphagia, unspecified: Secondary | ICD-10-CM | POA: Diagnosis not present

## 2023-03-31 DIAGNOSIS — F32A Depression, unspecified: Secondary | ICD-10-CM | POA: Diagnosis not present

## 2023-03-31 DIAGNOSIS — N179 Acute kidney failure, unspecified: Secondary | ICD-10-CM | POA: Diagnosis not present

## 2023-03-31 DIAGNOSIS — B3749 Other urogenital candidiasis: Secondary | ICD-10-CM | POA: Diagnosis not present

## 2023-03-31 DIAGNOSIS — Z7902 Long term (current) use of antithrombotics/antiplatelets: Secondary | ICD-10-CM | POA: Diagnosis not present

## 2023-03-31 DIAGNOSIS — K219 Gastro-esophageal reflux disease without esophagitis: Secondary | ICD-10-CM | POA: Diagnosis not present

## 2023-03-31 DIAGNOSIS — Z955 Presence of coronary angioplasty implant and graft: Secondary | ICD-10-CM | POA: Diagnosis not present

## 2023-03-31 DIAGNOSIS — N1832 Chronic kidney disease, stage 3b: Secondary | ICD-10-CM | POA: Diagnosis not present

## 2023-03-31 DIAGNOSIS — E782 Mixed hyperlipidemia: Secondary | ICD-10-CM | POA: Diagnosis not present

## 2023-03-31 DIAGNOSIS — J181 Lobar pneumonia, unspecified organism: Secondary | ICD-10-CM | POA: Diagnosis not present

## 2023-03-31 DIAGNOSIS — I5042 Chronic combined systolic (congestive) and diastolic (congestive) heart failure: Secondary | ICD-10-CM | POA: Diagnosis not present

## 2023-03-31 DIAGNOSIS — I700 Atherosclerosis of aorta: Secondary | ICD-10-CM | POA: Diagnosis not present

## 2023-03-31 DIAGNOSIS — F4310 Post-traumatic stress disorder, unspecified: Secondary | ICD-10-CM | POA: Diagnosis not present

## 2023-03-31 DIAGNOSIS — K5790 Diverticulosis of intestine, part unspecified, without perforation or abscess without bleeding: Secondary | ICD-10-CM | POA: Diagnosis not present

## 2023-03-31 DIAGNOSIS — R21 Rash and other nonspecific skin eruption: Secondary | ICD-10-CM | POA: Diagnosis not present

## 2023-03-31 DIAGNOSIS — I252 Old myocardial infarction: Secondary | ICD-10-CM | POA: Diagnosis not present

## 2023-04-04 DIAGNOSIS — F32A Depression, unspecified: Secondary | ICD-10-CM | POA: Diagnosis not present

## 2023-04-04 DIAGNOSIS — I255 Ischemic cardiomyopathy: Secondary | ICD-10-CM | POA: Diagnosis not present

## 2023-04-04 DIAGNOSIS — N179 Acute kidney failure, unspecified: Secondary | ICD-10-CM | POA: Diagnosis not present

## 2023-04-04 DIAGNOSIS — R21 Rash and other nonspecific skin eruption: Secondary | ICD-10-CM | POA: Diagnosis not present

## 2023-04-04 DIAGNOSIS — F0393 Unspecified dementia, unspecified severity, with mood disturbance: Secondary | ICD-10-CM | POA: Diagnosis not present

## 2023-04-04 DIAGNOSIS — K219 Gastro-esophageal reflux disease without esophagitis: Secondary | ICD-10-CM | POA: Diagnosis not present

## 2023-04-04 DIAGNOSIS — R519 Headache, unspecified: Secondary | ICD-10-CM | POA: Diagnosis not present

## 2023-04-04 DIAGNOSIS — R1310 Dysphagia, unspecified: Secondary | ICD-10-CM | POA: Diagnosis not present

## 2023-04-04 DIAGNOSIS — D500 Iron deficiency anemia secondary to blood loss (chronic): Secondary | ICD-10-CM | POA: Diagnosis not present

## 2023-04-04 DIAGNOSIS — Z955 Presence of coronary angioplasty implant and graft: Secondary | ICD-10-CM | POA: Diagnosis not present

## 2023-04-04 DIAGNOSIS — N401 Enlarged prostate with lower urinary tract symptoms: Secondary | ICD-10-CM | POA: Diagnosis not present

## 2023-04-04 DIAGNOSIS — C9111 Chronic lymphocytic leukemia of B-cell type in remission: Secondary | ICD-10-CM | POA: Diagnosis not present

## 2023-04-04 DIAGNOSIS — N39498 Other specified urinary incontinence: Secondary | ICD-10-CM | POA: Diagnosis not present

## 2023-04-04 DIAGNOSIS — I252 Old myocardial infarction: Secondary | ICD-10-CM | POA: Diagnosis not present

## 2023-04-04 DIAGNOSIS — Z7902 Long term (current) use of antithrombotics/antiplatelets: Secondary | ICD-10-CM | POA: Diagnosis not present

## 2023-04-04 DIAGNOSIS — F4310 Post-traumatic stress disorder, unspecified: Secondary | ICD-10-CM | POA: Diagnosis not present

## 2023-04-04 DIAGNOSIS — I700 Atherosclerosis of aorta: Secondary | ICD-10-CM | POA: Diagnosis not present

## 2023-04-04 DIAGNOSIS — I130 Hypertensive heart and chronic kidney disease with heart failure and stage 1 through stage 4 chronic kidney disease, or unspecified chronic kidney disease: Secondary | ICD-10-CM | POA: Diagnosis not present

## 2023-04-04 DIAGNOSIS — E782 Mixed hyperlipidemia: Secondary | ICD-10-CM | POA: Diagnosis not present

## 2023-04-04 DIAGNOSIS — J181 Lobar pneumonia, unspecified organism: Secondary | ICD-10-CM | POA: Diagnosis not present

## 2023-04-04 DIAGNOSIS — K5790 Diverticulosis of intestine, part unspecified, without perforation or abscess without bleeding: Secondary | ICD-10-CM | POA: Diagnosis not present

## 2023-04-04 DIAGNOSIS — I2510 Atherosclerotic heart disease of native coronary artery without angina pectoris: Secondary | ICD-10-CM | POA: Diagnosis not present

## 2023-04-04 DIAGNOSIS — N1832 Chronic kidney disease, stage 3b: Secondary | ICD-10-CM | POA: Diagnosis not present

## 2023-04-04 DIAGNOSIS — I5042 Chronic combined systolic (congestive) and diastolic (congestive) heart failure: Secondary | ICD-10-CM | POA: Diagnosis not present

## 2023-04-06 DIAGNOSIS — D500 Iron deficiency anemia secondary to blood loss (chronic): Secondary | ICD-10-CM | POA: Diagnosis not present

## 2023-04-06 DIAGNOSIS — N1832 Chronic kidney disease, stage 3b: Secondary | ICD-10-CM | POA: Diagnosis not present

## 2023-04-06 DIAGNOSIS — J181 Lobar pneumonia, unspecified organism: Secondary | ICD-10-CM | POA: Diagnosis not present

## 2023-04-06 DIAGNOSIS — N401 Enlarged prostate with lower urinary tract symptoms: Secondary | ICD-10-CM | POA: Diagnosis not present

## 2023-04-06 DIAGNOSIS — I252 Old myocardial infarction: Secondary | ICD-10-CM | POA: Diagnosis not present

## 2023-04-06 DIAGNOSIS — R21 Rash and other nonspecific skin eruption: Secondary | ICD-10-CM | POA: Diagnosis not present

## 2023-04-06 DIAGNOSIS — E782 Mixed hyperlipidemia: Secondary | ICD-10-CM | POA: Diagnosis not present

## 2023-04-06 DIAGNOSIS — I5042 Chronic combined systolic (congestive) and diastolic (congestive) heart failure: Secondary | ICD-10-CM | POA: Diagnosis not present

## 2023-04-06 DIAGNOSIS — I130 Hypertensive heart and chronic kidney disease with heart failure and stage 1 through stage 4 chronic kidney disease, or unspecified chronic kidney disease: Secondary | ICD-10-CM | POA: Diagnosis not present

## 2023-04-06 DIAGNOSIS — R519 Headache, unspecified: Secondary | ICD-10-CM | POA: Diagnosis not present

## 2023-04-06 DIAGNOSIS — F32A Depression, unspecified: Secondary | ICD-10-CM | POA: Diagnosis not present

## 2023-04-06 DIAGNOSIS — F0393 Unspecified dementia, unspecified severity, with mood disturbance: Secondary | ICD-10-CM | POA: Diagnosis not present

## 2023-04-06 DIAGNOSIS — Z7902 Long term (current) use of antithrombotics/antiplatelets: Secondary | ICD-10-CM | POA: Diagnosis not present

## 2023-04-06 DIAGNOSIS — N179 Acute kidney failure, unspecified: Secondary | ICD-10-CM | POA: Diagnosis not present

## 2023-04-06 DIAGNOSIS — I700 Atherosclerosis of aorta: Secondary | ICD-10-CM | POA: Diagnosis not present

## 2023-04-06 DIAGNOSIS — K5790 Diverticulosis of intestine, part unspecified, without perforation or abscess without bleeding: Secondary | ICD-10-CM | POA: Diagnosis not present

## 2023-04-06 DIAGNOSIS — I2510 Atherosclerotic heart disease of native coronary artery without angina pectoris: Secondary | ICD-10-CM | POA: Diagnosis not present

## 2023-04-06 DIAGNOSIS — R1310 Dysphagia, unspecified: Secondary | ICD-10-CM | POA: Diagnosis not present

## 2023-04-06 DIAGNOSIS — I255 Ischemic cardiomyopathy: Secondary | ICD-10-CM | POA: Diagnosis not present

## 2023-04-06 DIAGNOSIS — K219 Gastro-esophageal reflux disease without esophagitis: Secondary | ICD-10-CM | POA: Diagnosis not present

## 2023-04-06 DIAGNOSIS — C9111 Chronic lymphocytic leukemia of B-cell type in remission: Secondary | ICD-10-CM | POA: Diagnosis not present

## 2023-04-06 DIAGNOSIS — N39498 Other specified urinary incontinence: Secondary | ICD-10-CM | POA: Diagnosis not present

## 2023-04-06 DIAGNOSIS — Z955 Presence of coronary angioplasty implant and graft: Secondary | ICD-10-CM | POA: Diagnosis not present

## 2023-04-06 DIAGNOSIS — F4310 Post-traumatic stress disorder, unspecified: Secondary | ICD-10-CM | POA: Diagnosis not present

## 2023-04-11 DIAGNOSIS — F32A Depression, unspecified: Secondary | ICD-10-CM | POA: Diagnosis not present

## 2023-04-11 DIAGNOSIS — K5790 Diverticulosis of intestine, part unspecified, without perforation or abscess without bleeding: Secondary | ICD-10-CM | POA: Diagnosis not present

## 2023-04-11 DIAGNOSIS — I700 Atherosclerosis of aorta: Secondary | ICD-10-CM | POA: Diagnosis not present

## 2023-04-11 DIAGNOSIS — N39498 Other specified urinary incontinence: Secondary | ICD-10-CM | POA: Diagnosis not present

## 2023-04-11 DIAGNOSIS — I255 Ischemic cardiomyopathy: Secondary | ICD-10-CM | POA: Diagnosis not present

## 2023-04-11 DIAGNOSIS — N1832 Chronic kidney disease, stage 3b: Secondary | ICD-10-CM | POA: Diagnosis not present

## 2023-04-11 DIAGNOSIS — Z955 Presence of coronary angioplasty implant and graft: Secondary | ICD-10-CM | POA: Diagnosis not present

## 2023-04-11 DIAGNOSIS — N401 Enlarged prostate with lower urinary tract symptoms: Secondary | ICD-10-CM | POA: Diagnosis not present

## 2023-04-11 DIAGNOSIS — C9111 Chronic lymphocytic leukemia of B-cell type in remission: Secondary | ICD-10-CM | POA: Diagnosis not present

## 2023-04-11 DIAGNOSIS — I252 Old myocardial infarction: Secondary | ICD-10-CM | POA: Diagnosis not present

## 2023-04-11 DIAGNOSIS — I5042 Chronic combined systolic (congestive) and diastolic (congestive) heart failure: Secondary | ICD-10-CM | POA: Diagnosis not present

## 2023-04-11 DIAGNOSIS — I2510 Atherosclerotic heart disease of native coronary artery without angina pectoris: Secondary | ICD-10-CM | POA: Diagnosis not present

## 2023-04-11 DIAGNOSIS — F4310 Post-traumatic stress disorder, unspecified: Secondary | ICD-10-CM | POA: Diagnosis not present

## 2023-04-11 DIAGNOSIS — R21 Rash and other nonspecific skin eruption: Secondary | ICD-10-CM | POA: Diagnosis not present

## 2023-04-11 DIAGNOSIS — D500 Iron deficiency anemia secondary to blood loss (chronic): Secondary | ICD-10-CM | POA: Diagnosis not present

## 2023-04-11 DIAGNOSIS — K219 Gastro-esophageal reflux disease without esophagitis: Secondary | ICD-10-CM | POA: Diagnosis not present

## 2023-04-11 DIAGNOSIS — I130 Hypertensive heart and chronic kidney disease with heart failure and stage 1 through stage 4 chronic kidney disease, or unspecified chronic kidney disease: Secondary | ICD-10-CM | POA: Diagnosis not present

## 2023-04-11 DIAGNOSIS — R519 Headache, unspecified: Secondary | ICD-10-CM | POA: Diagnosis not present

## 2023-04-11 DIAGNOSIS — Z7902 Long term (current) use of antithrombotics/antiplatelets: Secondary | ICD-10-CM | POA: Diagnosis not present

## 2023-04-11 DIAGNOSIS — R1310 Dysphagia, unspecified: Secondary | ICD-10-CM | POA: Diagnosis not present

## 2023-04-11 DIAGNOSIS — N179 Acute kidney failure, unspecified: Secondary | ICD-10-CM | POA: Diagnosis not present

## 2023-04-11 DIAGNOSIS — F0393 Unspecified dementia, unspecified severity, with mood disturbance: Secondary | ICD-10-CM | POA: Diagnosis not present

## 2023-04-11 DIAGNOSIS — J181 Lobar pneumonia, unspecified organism: Secondary | ICD-10-CM | POA: Diagnosis not present

## 2023-04-11 DIAGNOSIS — E782 Mixed hyperlipidemia: Secondary | ICD-10-CM | POA: Diagnosis not present

## 2023-04-12 DIAGNOSIS — N179 Acute kidney failure, unspecified: Secondary | ICD-10-CM | POA: Diagnosis not present

## 2023-04-12 DIAGNOSIS — E782 Mixed hyperlipidemia: Secondary | ICD-10-CM | POA: Diagnosis not present

## 2023-04-12 DIAGNOSIS — F32A Depression, unspecified: Secondary | ICD-10-CM | POA: Diagnosis not present

## 2023-04-12 DIAGNOSIS — N1832 Chronic kidney disease, stage 3b: Secondary | ICD-10-CM | POA: Diagnosis not present

## 2023-04-12 DIAGNOSIS — Z955 Presence of coronary angioplasty implant and graft: Secondary | ICD-10-CM | POA: Diagnosis not present

## 2023-04-12 DIAGNOSIS — I700 Atherosclerosis of aorta: Secondary | ICD-10-CM | POA: Diagnosis not present

## 2023-04-12 DIAGNOSIS — I2510 Atherosclerotic heart disease of native coronary artery without angina pectoris: Secondary | ICD-10-CM | POA: Diagnosis not present

## 2023-04-12 DIAGNOSIS — N401 Enlarged prostate with lower urinary tract symptoms: Secondary | ICD-10-CM | POA: Diagnosis not present

## 2023-04-12 DIAGNOSIS — Z7902 Long term (current) use of antithrombotics/antiplatelets: Secondary | ICD-10-CM | POA: Diagnosis not present

## 2023-04-12 DIAGNOSIS — R519 Headache, unspecified: Secondary | ICD-10-CM | POA: Diagnosis not present

## 2023-04-12 DIAGNOSIS — J181 Lobar pneumonia, unspecified organism: Secondary | ICD-10-CM | POA: Diagnosis not present

## 2023-04-12 DIAGNOSIS — D500 Iron deficiency anemia secondary to blood loss (chronic): Secondary | ICD-10-CM | POA: Diagnosis not present

## 2023-04-12 DIAGNOSIS — F4310 Post-traumatic stress disorder, unspecified: Secondary | ICD-10-CM | POA: Diagnosis not present

## 2023-04-12 DIAGNOSIS — I130 Hypertensive heart and chronic kidney disease with heart failure and stage 1 through stage 4 chronic kidney disease, or unspecified chronic kidney disease: Secondary | ICD-10-CM | POA: Diagnosis not present

## 2023-04-12 DIAGNOSIS — F0393 Unspecified dementia, unspecified severity, with mood disturbance: Secondary | ICD-10-CM | POA: Diagnosis not present

## 2023-04-12 DIAGNOSIS — N39498 Other specified urinary incontinence: Secondary | ICD-10-CM | POA: Diagnosis not present

## 2023-04-12 DIAGNOSIS — K219 Gastro-esophageal reflux disease without esophagitis: Secondary | ICD-10-CM | POA: Diagnosis not present

## 2023-04-12 DIAGNOSIS — C9111 Chronic lymphocytic leukemia of B-cell type in remission: Secondary | ICD-10-CM | POA: Diagnosis not present

## 2023-04-12 DIAGNOSIS — R21 Rash and other nonspecific skin eruption: Secondary | ICD-10-CM | POA: Diagnosis not present

## 2023-04-12 DIAGNOSIS — I5042 Chronic combined systolic (congestive) and diastolic (congestive) heart failure: Secondary | ICD-10-CM | POA: Diagnosis not present

## 2023-04-12 DIAGNOSIS — R1310 Dysphagia, unspecified: Secondary | ICD-10-CM | POA: Diagnosis not present

## 2023-04-12 DIAGNOSIS — I255 Ischemic cardiomyopathy: Secondary | ICD-10-CM | POA: Diagnosis not present

## 2023-04-12 DIAGNOSIS — K5790 Diverticulosis of intestine, part unspecified, without perforation or abscess without bleeding: Secondary | ICD-10-CM | POA: Diagnosis not present

## 2023-04-12 DIAGNOSIS — I252 Old myocardial infarction: Secondary | ICD-10-CM | POA: Diagnosis not present

## 2023-04-13 DIAGNOSIS — K5790 Diverticulosis of intestine, part unspecified, without perforation or abscess without bleeding: Secondary | ICD-10-CM | POA: Diagnosis not present

## 2023-04-13 DIAGNOSIS — Z7902 Long term (current) use of antithrombotics/antiplatelets: Secondary | ICD-10-CM | POA: Diagnosis not present

## 2023-04-13 DIAGNOSIS — I255 Ischemic cardiomyopathy: Secondary | ICD-10-CM | POA: Diagnosis not present

## 2023-04-13 DIAGNOSIS — I5042 Chronic combined systolic (congestive) and diastolic (congestive) heart failure: Secondary | ICD-10-CM | POA: Diagnosis not present

## 2023-04-13 DIAGNOSIS — N401 Enlarged prostate with lower urinary tract symptoms: Secondary | ICD-10-CM | POA: Diagnosis not present

## 2023-04-13 DIAGNOSIS — R21 Rash and other nonspecific skin eruption: Secondary | ICD-10-CM | POA: Diagnosis not present

## 2023-04-13 DIAGNOSIS — J181 Lobar pneumonia, unspecified organism: Secondary | ICD-10-CM | POA: Diagnosis not present

## 2023-04-13 DIAGNOSIS — E782 Mixed hyperlipidemia: Secondary | ICD-10-CM | POA: Diagnosis not present

## 2023-04-13 DIAGNOSIS — C9111 Chronic lymphocytic leukemia of B-cell type in remission: Secondary | ICD-10-CM | POA: Diagnosis not present

## 2023-04-13 DIAGNOSIS — N1832 Chronic kidney disease, stage 3b: Secondary | ICD-10-CM | POA: Diagnosis not present

## 2023-04-13 DIAGNOSIS — I130 Hypertensive heart and chronic kidney disease with heart failure and stage 1 through stage 4 chronic kidney disease, or unspecified chronic kidney disease: Secondary | ICD-10-CM | POA: Diagnosis not present

## 2023-04-13 DIAGNOSIS — F32A Depression, unspecified: Secondary | ICD-10-CM | POA: Diagnosis not present

## 2023-04-13 DIAGNOSIS — F0393 Unspecified dementia, unspecified severity, with mood disturbance: Secondary | ICD-10-CM | POA: Diagnosis not present

## 2023-04-13 DIAGNOSIS — N39498 Other specified urinary incontinence: Secondary | ICD-10-CM | POA: Diagnosis not present

## 2023-04-13 DIAGNOSIS — R519 Headache, unspecified: Secondary | ICD-10-CM | POA: Diagnosis not present

## 2023-04-13 DIAGNOSIS — K219 Gastro-esophageal reflux disease without esophagitis: Secondary | ICD-10-CM | POA: Diagnosis not present

## 2023-04-13 DIAGNOSIS — F4310 Post-traumatic stress disorder, unspecified: Secondary | ICD-10-CM | POA: Diagnosis not present

## 2023-04-13 DIAGNOSIS — Z955 Presence of coronary angioplasty implant and graft: Secondary | ICD-10-CM | POA: Diagnosis not present

## 2023-04-13 DIAGNOSIS — I252 Old myocardial infarction: Secondary | ICD-10-CM | POA: Diagnosis not present

## 2023-04-13 DIAGNOSIS — N179 Acute kidney failure, unspecified: Secondary | ICD-10-CM | POA: Diagnosis not present

## 2023-04-13 DIAGNOSIS — R1310 Dysphagia, unspecified: Secondary | ICD-10-CM | POA: Diagnosis not present

## 2023-04-13 DIAGNOSIS — I700 Atherosclerosis of aorta: Secondary | ICD-10-CM | POA: Diagnosis not present

## 2023-04-13 DIAGNOSIS — I2510 Atherosclerotic heart disease of native coronary artery without angina pectoris: Secondary | ICD-10-CM | POA: Diagnosis not present

## 2023-04-13 DIAGNOSIS — D500 Iron deficiency anemia secondary to blood loss (chronic): Secondary | ICD-10-CM | POA: Diagnosis not present

## 2023-04-14 DIAGNOSIS — F0394 Unspecified dementia, unspecified severity, with anxiety: Secondary | ICD-10-CM | POA: Diagnosis not present

## 2023-04-14 DIAGNOSIS — J181 Lobar pneumonia, unspecified organism: Secondary | ICD-10-CM | POA: Diagnosis not present

## 2023-04-14 DIAGNOSIS — R7881 Bacteremia: Secondary | ICD-10-CM | POA: Diagnosis not present

## 2023-04-14 DIAGNOSIS — R6510 Systemic inflammatory response syndrome (SIRS) of non-infectious origin without acute organ dysfunction: Secondary | ICD-10-CM | POA: Diagnosis not present

## 2023-04-14 DIAGNOSIS — D631 Anemia in chronic kidney disease: Secondary | ICD-10-CM | POA: Diagnosis not present

## 2023-04-14 DIAGNOSIS — I82409 Acute embolism and thrombosis of unspecified deep veins of unspecified lower extremity: Secondary | ICD-10-CM | POA: Diagnosis not present

## 2023-04-14 DIAGNOSIS — C9110 Chronic lymphocytic leukemia of B-cell type not having achieved remission: Secondary | ICD-10-CM | POA: Diagnosis not present

## 2023-04-14 DIAGNOSIS — I130 Hypertensive heart and chronic kidney disease with heart failure and stage 1 through stage 4 chronic kidney disease, or unspecified chronic kidney disease: Secondary | ICD-10-CM | POA: Diagnosis not present

## 2023-04-14 DIAGNOSIS — R7303 Prediabetes: Secondary | ICD-10-CM | POA: Diagnosis not present

## 2023-04-14 DIAGNOSIS — R1310 Dysphagia, unspecified: Secondary | ICD-10-CM | POA: Diagnosis not present

## 2023-04-14 DIAGNOSIS — E785 Hyperlipidemia, unspecified: Secondary | ICD-10-CM | POA: Diagnosis not present

## 2023-04-14 DIAGNOSIS — N1832 Chronic kidney disease, stage 3b: Secondary | ICD-10-CM | POA: Diagnosis not present

## 2023-04-18 DIAGNOSIS — R21 Rash and other nonspecific skin eruption: Secondary | ICD-10-CM | POA: Diagnosis not present

## 2023-04-18 DIAGNOSIS — N401 Enlarged prostate with lower urinary tract symptoms: Secondary | ICD-10-CM | POA: Diagnosis not present

## 2023-04-18 DIAGNOSIS — F0393 Unspecified dementia, unspecified severity, with mood disturbance: Secondary | ICD-10-CM | POA: Diagnosis not present

## 2023-04-18 DIAGNOSIS — K219 Gastro-esophageal reflux disease without esophagitis: Secondary | ICD-10-CM | POA: Diagnosis not present

## 2023-04-18 DIAGNOSIS — K5790 Diverticulosis of intestine, part unspecified, without perforation or abscess without bleeding: Secondary | ICD-10-CM | POA: Diagnosis not present

## 2023-04-18 DIAGNOSIS — F32A Depression, unspecified: Secondary | ICD-10-CM | POA: Diagnosis not present

## 2023-04-18 DIAGNOSIS — J181 Lobar pneumonia, unspecified organism: Secondary | ICD-10-CM | POA: Diagnosis not present

## 2023-04-18 DIAGNOSIS — E782 Mixed hyperlipidemia: Secondary | ICD-10-CM | POA: Diagnosis not present

## 2023-04-18 DIAGNOSIS — R1310 Dysphagia, unspecified: Secondary | ICD-10-CM | POA: Diagnosis not present

## 2023-04-18 DIAGNOSIS — I700 Atherosclerosis of aorta: Secondary | ICD-10-CM | POA: Diagnosis not present

## 2023-04-18 DIAGNOSIS — N179 Acute kidney failure, unspecified: Secondary | ICD-10-CM | POA: Diagnosis not present

## 2023-04-18 DIAGNOSIS — I2510 Atherosclerotic heart disease of native coronary artery without angina pectoris: Secondary | ICD-10-CM | POA: Diagnosis not present

## 2023-04-18 DIAGNOSIS — I252 Old myocardial infarction: Secondary | ICD-10-CM | POA: Diagnosis not present

## 2023-04-18 DIAGNOSIS — N1832 Chronic kidney disease, stage 3b: Secondary | ICD-10-CM | POA: Diagnosis not present

## 2023-04-18 DIAGNOSIS — Z7902 Long term (current) use of antithrombotics/antiplatelets: Secondary | ICD-10-CM | POA: Diagnosis not present

## 2023-04-18 DIAGNOSIS — D500 Iron deficiency anemia secondary to blood loss (chronic): Secondary | ICD-10-CM | POA: Diagnosis not present

## 2023-04-18 DIAGNOSIS — Z955 Presence of coronary angioplasty implant and graft: Secondary | ICD-10-CM | POA: Diagnosis not present

## 2023-04-18 DIAGNOSIS — R519 Headache, unspecified: Secondary | ICD-10-CM | POA: Diagnosis not present

## 2023-04-18 DIAGNOSIS — I5042 Chronic combined systolic (congestive) and diastolic (congestive) heart failure: Secondary | ICD-10-CM | POA: Diagnosis not present

## 2023-04-18 DIAGNOSIS — C9111 Chronic lymphocytic leukemia of B-cell type in remission: Secondary | ICD-10-CM | POA: Diagnosis not present

## 2023-04-18 DIAGNOSIS — I255 Ischemic cardiomyopathy: Secondary | ICD-10-CM | POA: Diagnosis not present

## 2023-04-18 DIAGNOSIS — F4310 Post-traumatic stress disorder, unspecified: Secondary | ICD-10-CM | POA: Diagnosis not present

## 2023-04-18 DIAGNOSIS — N39498 Other specified urinary incontinence: Secondary | ICD-10-CM | POA: Diagnosis not present

## 2023-04-18 DIAGNOSIS — I130 Hypertensive heart and chronic kidney disease with heart failure and stage 1 through stage 4 chronic kidney disease, or unspecified chronic kidney disease: Secondary | ICD-10-CM | POA: Diagnosis not present

## 2023-04-19 DIAGNOSIS — I2510 Atherosclerotic heart disease of native coronary artery without angina pectoris: Secondary | ICD-10-CM | POA: Diagnosis not present

## 2023-04-19 DIAGNOSIS — F32A Depression, unspecified: Secondary | ICD-10-CM | POA: Diagnosis not present

## 2023-04-19 DIAGNOSIS — R1310 Dysphagia, unspecified: Secondary | ICD-10-CM | POA: Diagnosis not present

## 2023-04-19 DIAGNOSIS — N179 Acute kidney failure, unspecified: Secondary | ICD-10-CM | POA: Diagnosis not present

## 2023-04-19 DIAGNOSIS — E782 Mixed hyperlipidemia: Secondary | ICD-10-CM | POA: Diagnosis not present

## 2023-04-19 DIAGNOSIS — N1832 Chronic kidney disease, stage 3b: Secondary | ICD-10-CM | POA: Diagnosis not present

## 2023-04-19 DIAGNOSIS — N401 Enlarged prostate with lower urinary tract symptoms: Secondary | ICD-10-CM | POA: Diagnosis not present

## 2023-04-19 DIAGNOSIS — N39498 Other specified urinary incontinence: Secondary | ICD-10-CM | POA: Diagnosis not present

## 2023-04-19 DIAGNOSIS — F0393 Unspecified dementia, unspecified severity, with mood disturbance: Secondary | ICD-10-CM | POA: Diagnosis not present

## 2023-04-19 DIAGNOSIS — I255 Ischemic cardiomyopathy: Secondary | ICD-10-CM | POA: Diagnosis not present

## 2023-04-19 DIAGNOSIS — R21 Rash and other nonspecific skin eruption: Secondary | ICD-10-CM | POA: Diagnosis not present

## 2023-04-19 DIAGNOSIS — I5042 Chronic combined systolic (congestive) and diastolic (congestive) heart failure: Secondary | ICD-10-CM | POA: Diagnosis not present

## 2023-04-19 DIAGNOSIS — K5790 Diverticulosis of intestine, part unspecified, without perforation or abscess without bleeding: Secondary | ICD-10-CM | POA: Diagnosis not present

## 2023-04-19 DIAGNOSIS — K219 Gastro-esophageal reflux disease without esophagitis: Secondary | ICD-10-CM | POA: Diagnosis not present

## 2023-04-19 DIAGNOSIS — I130 Hypertensive heart and chronic kidney disease with heart failure and stage 1 through stage 4 chronic kidney disease, or unspecified chronic kidney disease: Secondary | ICD-10-CM | POA: Diagnosis not present

## 2023-04-19 DIAGNOSIS — J181 Lobar pneumonia, unspecified organism: Secondary | ICD-10-CM | POA: Diagnosis not present

## 2023-04-19 DIAGNOSIS — F4310 Post-traumatic stress disorder, unspecified: Secondary | ICD-10-CM | POA: Diagnosis not present

## 2023-04-19 DIAGNOSIS — R519 Headache, unspecified: Secondary | ICD-10-CM | POA: Diagnosis not present

## 2023-04-19 DIAGNOSIS — I252 Old myocardial infarction: Secondary | ICD-10-CM | POA: Diagnosis not present

## 2023-04-19 DIAGNOSIS — C9111 Chronic lymphocytic leukemia of B-cell type in remission: Secondary | ICD-10-CM | POA: Diagnosis not present

## 2023-04-19 DIAGNOSIS — I700 Atherosclerosis of aorta: Secondary | ICD-10-CM | POA: Diagnosis not present

## 2023-04-19 DIAGNOSIS — D500 Iron deficiency anemia secondary to blood loss (chronic): Secondary | ICD-10-CM | POA: Diagnosis not present

## 2023-04-19 DIAGNOSIS — Z955 Presence of coronary angioplasty implant and graft: Secondary | ICD-10-CM | POA: Diagnosis not present

## 2023-04-19 DIAGNOSIS — Z7902 Long term (current) use of antithrombotics/antiplatelets: Secondary | ICD-10-CM | POA: Diagnosis not present

## 2023-04-25 DIAGNOSIS — K5790 Diverticulosis of intestine, part unspecified, without perforation or abscess without bleeding: Secondary | ICD-10-CM | POA: Diagnosis not present

## 2023-04-25 DIAGNOSIS — Z955 Presence of coronary angioplasty implant and graft: Secondary | ICD-10-CM | POA: Diagnosis not present

## 2023-04-25 DIAGNOSIS — I700 Atherosclerosis of aorta: Secondary | ICD-10-CM | POA: Diagnosis not present

## 2023-04-25 DIAGNOSIS — N1832 Chronic kidney disease, stage 3b: Secondary | ICD-10-CM | POA: Diagnosis not present

## 2023-04-25 DIAGNOSIS — R1310 Dysphagia, unspecified: Secondary | ICD-10-CM | POA: Diagnosis not present

## 2023-04-25 DIAGNOSIS — F32A Depression, unspecified: Secondary | ICD-10-CM | POA: Diagnosis not present

## 2023-04-25 DIAGNOSIS — N179 Acute kidney failure, unspecified: Secondary | ICD-10-CM | POA: Diagnosis not present

## 2023-04-25 DIAGNOSIS — I5042 Chronic combined systolic (congestive) and diastolic (congestive) heart failure: Secondary | ICD-10-CM | POA: Diagnosis not present

## 2023-04-25 DIAGNOSIS — J181 Lobar pneumonia, unspecified organism: Secondary | ICD-10-CM | POA: Diagnosis not present

## 2023-04-25 DIAGNOSIS — C9111 Chronic lymphocytic leukemia of B-cell type in remission: Secondary | ICD-10-CM | POA: Diagnosis not present

## 2023-04-25 DIAGNOSIS — F0393 Unspecified dementia, unspecified severity, with mood disturbance: Secondary | ICD-10-CM | POA: Diagnosis not present

## 2023-04-25 DIAGNOSIS — R21 Rash and other nonspecific skin eruption: Secondary | ICD-10-CM | POA: Diagnosis not present

## 2023-04-25 DIAGNOSIS — E782 Mixed hyperlipidemia: Secondary | ICD-10-CM | POA: Diagnosis not present

## 2023-04-25 DIAGNOSIS — N39498 Other specified urinary incontinence: Secondary | ICD-10-CM | POA: Diagnosis not present

## 2023-04-25 DIAGNOSIS — N401 Enlarged prostate with lower urinary tract symptoms: Secondary | ICD-10-CM | POA: Diagnosis not present

## 2023-04-25 DIAGNOSIS — I255 Ischemic cardiomyopathy: Secondary | ICD-10-CM | POA: Diagnosis not present

## 2023-04-25 DIAGNOSIS — F4310 Post-traumatic stress disorder, unspecified: Secondary | ICD-10-CM | POA: Diagnosis not present

## 2023-04-25 DIAGNOSIS — I2510 Atherosclerotic heart disease of native coronary artery without angina pectoris: Secondary | ICD-10-CM | POA: Diagnosis not present

## 2023-04-25 DIAGNOSIS — Z7902 Long term (current) use of antithrombotics/antiplatelets: Secondary | ICD-10-CM | POA: Diagnosis not present

## 2023-04-25 DIAGNOSIS — K219 Gastro-esophageal reflux disease without esophagitis: Secondary | ICD-10-CM | POA: Diagnosis not present

## 2023-04-25 DIAGNOSIS — I130 Hypertensive heart and chronic kidney disease with heart failure and stage 1 through stage 4 chronic kidney disease, or unspecified chronic kidney disease: Secondary | ICD-10-CM | POA: Diagnosis not present

## 2023-04-25 DIAGNOSIS — R519 Headache, unspecified: Secondary | ICD-10-CM | POA: Diagnosis not present

## 2023-04-25 DIAGNOSIS — D500 Iron deficiency anemia secondary to blood loss (chronic): Secondary | ICD-10-CM | POA: Diagnosis not present

## 2023-04-25 DIAGNOSIS — I252 Old myocardial infarction: Secondary | ICD-10-CM | POA: Diagnosis not present

## 2023-04-27 DIAGNOSIS — E782 Mixed hyperlipidemia: Secondary | ICD-10-CM | POA: Diagnosis not present

## 2023-04-27 DIAGNOSIS — N39498 Other specified urinary incontinence: Secondary | ICD-10-CM | POA: Diagnosis not present

## 2023-04-27 DIAGNOSIS — I5042 Chronic combined systolic (congestive) and diastolic (congestive) heart failure: Secondary | ICD-10-CM | POA: Diagnosis not present

## 2023-04-27 DIAGNOSIS — F32A Depression, unspecified: Secondary | ICD-10-CM | POA: Diagnosis not present

## 2023-04-27 DIAGNOSIS — I252 Old myocardial infarction: Secondary | ICD-10-CM | POA: Diagnosis not present

## 2023-04-27 DIAGNOSIS — R21 Rash and other nonspecific skin eruption: Secondary | ICD-10-CM | POA: Diagnosis not present

## 2023-04-27 DIAGNOSIS — Z7902 Long term (current) use of antithrombotics/antiplatelets: Secondary | ICD-10-CM | POA: Diagnosis not present

## 2023-04-27 DIAGNOSIS — J181 Lobar pneumonia, unspecified organism: Secondary | ICD-10-CM | POA: Diagnosis not present

## 2023-04-27 DIAGNOSIS — I700 Atherosclerosis of aorta: Secondary | ICD-10-CM | POA: Diagnosis not present

## 2023-04-27 DIAGNOSIS — I255 Ischemic cardiomyopathy: Secondary | ICD-10-CM | POA: Diagnosis not present

## 2023-04-27 DIAGNOSIS — D500 Iron deficiency anemia secondary to blood loss (chronic): Secondary | ICD-10-CM | POA: Diagnosis not present

## 2023-04-27 DIAGNOSIS — F4310 Post-traumatic stress disorder, unspecified: Secondary | ICD-10-CM | POA: Diagnosis not present

## 2023-04-27 DIAGNOSIS — C9111 Chronic lymphocytic leukemia of B-cell type in remission: Secondary | ICD-10-CM | POA: Diagnosis not present

## 2023-04-27 DIAGNOSIS — N401 Enlarged prostate with lower urinary tract symptoms: Secondary | ICD-10-CM | POA: Diagnosis not present

## 2023-04-27 DIAGNOSIS — N1832 Chronic kidney disease, stage 3b: Secondary | ICD-10-CM | POA: Diagnosis not present

## 2023-04-27 DIAGNOSIS — K219 Gastro-esophageal reflux disease without esophagitis: Secondary | ICD-10-CM | POA: Diagnosis not present

## 2023-04-27 DIAGNOSIS — I130 Hypertensive heart and chronic kidney disease with heart failure and stage 1 through stage 4 chronic kidney disease, or unspecified chronic kidney disease: Secondary | ICD-10-CM | POA: Diagnosis not present

## 2023-04-27 DIAGNOSIS — R519 Headache, unspecified: Secondary | ICD-10-CM | POA: Diagnosis not present

## 2023-04-27 DIAGNOSIS — N179 Acute kidney failure, unspecified: Secondary | ICD-10-CM | POA: Diagnosis not present

## 2023-04-27 DIAGNOSIS — R1310 Dysphagia, unspecified: Secondary | ICD-10-CM | POA: Diagnosis not present

## 2023-04-27 DIAGNOSIS — I2510 Atherosclerotic heart disease of native coronary artery without angina pectoris: Secondary | ICD-10-CM | POA: Diagnosis not present

## 2023-04-27 DIAGNOSIS — F0393 Unspecified dementia, unspecified severity, with mood disturbance: Secondary | ICD-10-CM | POA: Diagnosis not present

## 2023-04-27 DIAGNOSIS — Z955 Presence of coronary angioplasty implant and graft: Secondary | ICD-10-CM | POA: Diagnosis not present

## 2023-04-27 DIAGNOSIS — K5790 Diverticulosis of intestine, part unspecified, without perforation or abscess without bleeding: Secondary | ICD-10-CM | POA: Diagnosis not present

## 2023-04-28 DIAGNOSIS — K219 Gastro-esophageal reflux disease without esophagitis: Secondary | ICD-10-CM | POA: Diagnosis not present

## 2023-04-28 DIAGNOSIS — I252 Old myocardial infarction: Secondary | ICD-10-CM | POA: Diagnosis not present

## 2023-04-28 DIAGNOSIS — Z7902 Long term (current) use of antithrombotics/antiplatelets: Secondary | ICD-10-CM | POA: Diagnosis not present

## 2023-04-28 DIAGNOSIS — F0393 Unspecified dementia, unspecified severity, with mood disturbance: Secondary | ICD-10-CM | POA: Diagnosis not present

## 2023-04-28 DIAGNOSIS — R1310 Dysphagia, unspecified: Secondary | ICD-10-CM | POA: Diagnosis not present

## 2023-04-28 DIAGNOSIS — R519 Headache, unspecified: Secondary | ICD-10-CM | POA: Diagnosis not present

## 2023-04-28 DIAGNOSIS — I700 Atherosclerosis of aorta: Secondary | ICD-10-CM | POA: Diagnosis not present

## 2023-04-28 DIAGNOSIS — E782 Mixed hyperlipidemia: Secondary | ICD-10-CM | POA: Diagnosis not present

## 2023-04-28 DIAGNOSIS — D500 Iron deficiency anemia secondary to blood loss (chronic): Secondary | ICD-10-CM | POA: Diagnosis not present

## 2023-04-28 DIAGNOSIS — I5042 Chronic combined systolic (congestive) and diastolic (congestive) heart failure: Secondary | ICD-10-CM | POA: Diagnosis not present

## 2023-04-28 DIAGNOSIS — N401 Enlarged prostate with lower urinary tract symptoms: Secondary | ICD-10-CM | POA: Diagnosis not present

## 2023-04-28 DIAGNOSIS — I2510 Atherosclerotic heart disease of native coronary artery without angina pectoris: Secondary | ICD-10-CM | POA: Diagnosis not present

## 2023-04-28 DIAGNOSIS — R21 Rash and other nonspecific skin eruption: Secondary | ICD-10-CM | POA: Diagnosis not present

## 2023-04-28 DIAGNOSIS — F32A Depression, unspecified: Secondary | ICD-10-CM | POA: Diagnosis not present

## 2023-04-28 DIAGNOSIS — N1832 Chronic kidney disease, stage 3b: Secondary | ICD-10-CM | POA: Diagnosis not present

## 2023-04-28 DIAGNOSIS — C9111 Chronic lymphocytic leukemia of B-cell type in remission: Secondary | ICD-10-CM | POA: Diagnosis not present

## 2023-04-28 DIAGNOSIS — I130 Hypertensive heart and chronic kidney disease with heart failure and stage 1 through stage 4 chronic kidney disease, or unspecified chronic kidney disease: Secondary | ICD-10-CM | POA: Diagnosis not present

## 2023-04-28 DIAGNOSIS — Z955 Presence of coronary angioplasty implant and graft: Secondary | ICD-10-CM | POA: Diagnosis not present

## 2023-04-28 DIAGNOSIS — J181 Lobar pneumonia, unspecified organism: Secondary | ICD-10-CM | POA: Diagnosis not present

## 2023-04-28 DIAGNOSIS — I255 Ischemic cardiomyopathy: Secondary | ICD-10-CM | POA: Diagnosis not present

## 2023-04-28 DIAGNOSIS — K5790 Diverticulosis of intestine, part unspecified, without perforation or abscess without bleeding: Secondary | ICD-10-CM | POA: Diagnosis not present

## 2023-04-28 DIAGNOSIS — N179 Acute kidney failure, unspecified: Secondary | ICD-10-CM | POA: Diagnosis not present

## 2023-04-28 DIAGNOSIS — N39498 Other specified urinary incontinence: Secondary | ICD-10-CM | POA: Diagnosis not present

## 2023-04-28 DIAGNOSIS — F4310 Post-traumatic stress disorder, unspecified: Secondary | ICD-10-CM | POA: Diagnosis not present

## 2023-04-29 DIAGNOSIS — C9110 Chronic lymphocytic leukemia of B-cell type not having achieved remission: Secondary | ICD-10-CM | POA: Diagnosis not present

## 2023-04-29 DIAGNOSIS — I5022 Chronic systolic (congestive) heart failure: Secondary | ICD-10-CM | POA: Diagnosis not present

## 2023-04-29 DIAGNOSIS — J181 Lobar pneumonia, unspecified organism: Secondary | ICD-10-CM | POA: Diagnosis not present

## 2023-04-29 DIAGNOSIS — R6510 Systemic inflammatory response syndrome (SIRS) of non-infectious origin without acute organ dysfunction: Secondary | ICD-10-CM | POA: Diagnosis not present

## 2023-05-01 DIAGNOSIS — I2510 Atherosclerotic heart disease of native coronary artery without angina pectoris: Secondary | ICD-10-CM | POA: Diagnosis not present

## 2023-05-01 DIAGNOSIS — J181 Lobar pneumonia, unspecified organism: Secondary | ICD-10-CM | POA: Diagnosis not present

## 2023-05-01 DIAGNOSIS — K219 Gastro-esophageal reflux disease without esophagitis: Secondary | ICD-10-CM | POA: Diagnosis not present

## 2023-05-01 DIAGNOSIS — N39498 Other specified urinary incontinence: Secondary | ICD-10-CM | POA: Diagnosis not present

## 2023-05-01 DIAGNOSIS — Z7902 Long term (current) use of antithrombotics/antiplatelets: Secondary | ICD-10-CM | POA: Diagnosis not present

## 2023-05-01 DIAGNOSIS — R21 Rash and other nonspecific skin eruption: Secondary | ICD-10-CM | POA: Diagnosis not present

## 2023-05-01 DIAGNOSIS — I255 Ischemic cardiomyopathy: Secondary | ICD-10-CM | POA: Diagnosis not present

## 2023-05-01 DIAGNOSIS — F4310 Post-traumatic stress disorder, unspecified: Secondary | ICD-10-CM | POA: Diagnosis not present

## 2023-05-01 DIAGNOSIS — F0393 Unspecified dementia, unspecified severity, with mood disturbance: Secondary | ICD-10-CM | POA: Diagnosis not present

## 2023-05-01 DIAGNOSIS — F32A Depression, unspecified: Secondary | ICD-10-CM | POA: Diagnosis not present

## 2023-05-01 DIAGNOSIS — N179 Acute kidney failure, unspecified: Secondary | ICD-10-CM | POA: Diagnosis not present

## 2023-05-01 DIAGNOSIS — D500 Iron deficiency anemia secondary to blood loss (chronic): Secondary | ICD-10-CM | POA: Diagnosis not present

## 2023-05-01 DIAGNOSIS — R519 Headache, unspecified: Secondary | ICD-10-CM | POA: Diagnosis not present

## 2023-05-01 DIAGNOSIS — N401 Enlarged prostate with lower urinary tract symptoms: Secondary | ICD-10-CM | POA: Diagnosis not present

## 2023-05-01 DIAGNOSIS — R1310 Dysphagia, unspecified: Secondary | ICD-10-CM | POA: Diagnosis not present

## 2023-05-01 DIAGNOSIS — Z955 Presence of coronary angioplasty implant and graft: Secondary | ICD-10-CM | POA: Diagnosis not present

## 2023-05-01 DIAGNOSIS — K5790 Diverticulosis of intestine, part unspecified, without perforation or abscess without bleeding: Secondary | ICD-10-CM | POA: Diagnosis not present

## 2023-05-01 DIAGNOSIS — I252 Old myocardial infarction: Secondary | ICD-10-CM | POA: Diagnosis not present

## 2023-05-01 DIAGNOSIS — I5042 Chronic combined systolic (congestive) and diastolic (congestive) heart failure: Secondary | ICD-10-CM | POA: Diagnosis not present

## 2023-05-01 DIAGNOSIS — I700 Atherosclerosis of aorta: Secondary | ICD-10-CM | POA: Diagnosis not present

## 2023-05-01 DIAGNOSIS — N1832 Chronic kidney disease, stage 3b: Secondary | ICD-10-CM | POA: Diagnosis not present

## 2023-05-01 DIAGNOSIS — C9111 Chronic lymphocytic leukemia of B-cell type in remission: Secondary | ICD-10-CM | POA: Diagnosis not present

## 2023-05-01 DIAGNOSIS — I130 Hypertensive heart and chronic kidney disease with heart failure and stage 1 through stage 4 chronic kidney disease, or unspecified chronic kidney disease: Secondary | ICD-10-CM | POA: Diagnosis not present

## 2023-05-01 DIAGNOSIS — E782 Mixed hyperlipidemia: Secondary | ICD-10-CM | POA: Diagnosis not present

## 2023-05-02 DIAGNOSIS — I5042 Chronic combined systolic (congestive) and diastolic (congestive) heart failure: Secondary | ICD-10-CM | POA: Diagnosis not present

## 2023-05-02 DIAGNOSIS — I2510 Atherosclerotic heart disease of native coronary artery without angina pectoris: Secondary | ICD-10-CM | POA: Diagnosis not present

## 2023-05-02 DIAGNOSIS — Z955 Presence of coronary angioplasty implant and graft: Secondary | ICD-10-CM | POA: Diagnosis not present

## 2023-05-02 DIAGNOSIS — I700 Atherosclerosis of aorta: Secondary | ICD-10-CM | POA: Diagnosis not present

## 2023-05-02 DIAGNOSIS — N1832 Chronic kidney disease, stage 3b: Secondary | ICD-10-CM | POA: Diagnosis not present

## 2023-05-02 DIAGNOSIS — K219 Gastro-esophageal reflux disease without esophagitis: Secondary | ICD-10-CM | POA: Diagnosis not present

## 2023-05-02 DIAGNOSIS — F4310 Post-traumatic stress disorder, unspecified: Secondary | ICD-10-CM | POA: Diagnosis not present

## 2023-05-02 DIAGNOSIS — N39498 Other specified urinary incontinence: Secondary | ICD-10-CM | POA: Diagnosis not present

## 2023-05-02 DIAGNOSIS — N179 Acute kidney failure, unspecified: Secondary | ICD-10-CM | POA: Diagnosis not present

## 2023-05-02 DIAGNOSIS — I255 Ischemic cardiomyopathy: Secondary | ICD-10-CM | POA: Diagnosis not present

## 2023-05-02 DIAGNOSIS — J181 Lobar pneumonia, unspecified organism: Secondary | ICD-10-CM | POA: Diagnosis not present

## 2023-05-02 DIAGNOSIS — D500 Iron deficiency anemia secondary to blood loss (chronic): Secondary | ICD-10-CM | POA: Diagnosis not present

## 2023-05-02 DIAGNOSIS — I130 Hypertensive heart and chronic kidney disease with heart failure and stage 1 through stage 4 chronic kidney disease, or unspecified chronic kidney disease: Secondary | ICD-10-CM | POA: Diagnosis not present

## 2023-05-02 DIAGNOSIS — I252 Old myocardial infarction: Secondary | ICD-10-CM | POA: Diagnosis not present

## 2023-05-02 DIAGNOSIS — R519 Headache, unspecified: Secondary | ICD-10-CM | POA: Diagnosis not present

## 2023-05-02 DIAGNOSIS — Z7902 Long term (current) use of antithrombotics/antiplatelets: Secondary | ICD-10-CM | POA: Diagnosis not present

## 2023-05-02 DIAGNOSIS — K5790 Diverticulosis of intestine, part unspecified, without perforation or abscess without bleeding: Secondary | ICD-10-CM | POA: Diagnosis not present

## 2023-05-02 DIAGNOSIS — E782 Mixed hyperlipidemia: Secondary | ICD-10-CM | POA: Diagnosis not present

## 2023-05-02 DIAGNOSIS — R21 Rash and other nonspecific skin eruption: Secondary | ICD-10-CM | POA: Diagnosis not present

## 2023-05-02 DIAGNOSIS — C9111 Chronic lymphocytic leukemia of B-cell type in remission: Secondary | ICD-10-CM | POA: Diagnosis not present

## 2023-05-02 DIAGNOSIS — R1310 Dysphagia, unspecified: Secondary | ICD-10-CM | POA: Diagnosis not present

## 2023-05-02 DIAGNOSIS — F32A Depression, unspecified: Secondary | ICD-10-CM | POA: Diagnosis not present

## 2023-05-02 DIAGNOSIS — F0393 Unspecified dementia, unspecified severity, with mood disturbance: Secondary | ICD-10-CM | POA: Diagnosis not present

## 2023-05-02 DIAGNOSIS — N401 Enlarged prostate with lower urinary tract symptoms: Secondary | ICD-10-CM | POA: Diagnosis not present

## 2023-05-05 DIAGNOSIS — C9111 Chronic lymphocytic leukemia of B-cell type in remission: Secondary | ICD-10-CM | POA: Diagnosis not present

## 2023-05-05 DIAGNOSIS — E782 Mixed hyperlipidemia: Secondary | ICD-10-CM | POA: Diagnosis not present

## 2023-05-05 DIAGNOSIS — Z955 Presence of coronary angioplasty implant and graft: Secondary | ICD-10-CM | POA: Diagnosis not present

## 2023-05-05 DIAGNOSIS — N39498 Other specified urinary incontinence: Secondary | ICD-10-CM | POA: Diagnosis not present

## 2023-05-05 DIAGNOSIS — Z7902 Long term (current) use of antithrombotics/antiplatelets: Secondary | ICD-10-CM | POA: Diagnosis not present

## 2023-05-05 DIAGNOSIS — F0393 Unspecified dementia, unspecified severity, with mood disturbance: Secondary | ICD-10-CM | POA: Diagnosis not present

## 2023-05-05 DIAGNOSIS — F4310 Post-traumatic stress disorder, unspecified: Secondary | ICD-10-CM | POA: Diagnosis not present

## 2023-05-05 DIAGNOSIS — F32A Depression, unspecified: Secondary | ICD-10-CM | POA: Diagnosis not present

## 2023-05-05 DIAGNOSIS — I5042 Chronic combined systolic (congestive) and diastolic (congestive) heart failure: Secondary | ICD-10-CM | POA: Diagnosis not present

## 2023-05-05 DIAGNOSIS — I255 Ischemic cardiomyopathy: Secondary | ICD-10-CM | POA: Diagnosis not present

## 2023-05-05 DIAGNOSIS — I700 Atherosclerosis of aorta: Secondary | ICD-10-CM | POA: Diagnosis not present

## 2023-05-05 DIAGNOSIS — I252 Old myocardial infarction: Secondary | ICD-10-CM | POA: Diagnosis not present

## 2023-05-05 DIAGNOSIS — N401 Enlarged prostate with lower urinary tract symptoms: Secondary | ICD-10-CM | POA: Diagnosis not present

## 2023-05-05 DIAGNOSIS — R21 Rash and other nonspecific skin eruption: Secondary | ICD-10-CM | POA: Diagnosis not present

## 2023-05-05 DIAGNOSIS — K219 Gastro-esophageal reflux disease without esophagitis: Secondary | ICD-10-CM | POA: Diagnosis not present

## 2023-05-05 DIAGNOSIS — I130 Hypertensive heart and chronic kidney disease with heart failure and stage 1 through stage 4 chronic kidney disease, or unspecified chronic kidney disease: Secondary | ICD-10-CM | POA: Diagnosis not present

## 2023-05-05 DIAGNOSIS — N1832 Chronic kidney disease, stage 3b: Secondary | ICD-10-CM | POA: Diagnosis not present

## 2023-05-05 DIAGNOSIS — D500 Iron deficiency anemia secondary to blood loss (chronic): Secondary | ICD-10-CM | POA: Diagnosis not present

## 2023-05-05 DIAGNOSIS — K5790 Diverticulosis of intestine, part unspecified, without perforation or abscess without bleeding: Secondary | ICD-10-CM | POA: Diagnosis not present

## 2023-05-05 DIAGNOSIS — J181 Lobar pneumonia, unspecified organism: Secondary | ICD-10-CM | POA: Diagnosis not present

## 2023-05-05 DIAGNOSIS — R1310 Dysphagia, unspecified: Secondary | ICD-10-CM | POA: Diagnosis not present

## 2023-05-05 DIAGNOSIS — R519 Headache, unspecified: Secondary | ICD-10-CM | POA: Diagnosis not present

## 2023-05-05 DIAGNOSIS — N179 Acute kidney failure, unspecified: Secondary | ICD-10-CM | POA: Diagnosis not present

## 2023-05-05 DIAGNOSIS — I2510 Atherosclerotic heart disease of native coronary artery without angina pectoris: Secondary | ICD-10-CM | POA: Diagnosis not present

## 2023-05-08 DIAGNOSIS — I2510 Atherosclerotic heart disease of native coronary artery without angina pectoris: Secondary | ICD-10-CM | POA: Diagnosis not present

## 2023-05-08 DIAGNOSIS — R1310 Dysphagia, unspecified: Secondary | ICD-10-CM | POA: Diagnosis not present

## 2023-05-08 DIAGNOSIS — I252 Old myocardial infarction: Secondary | ICD-10-CM | POA: Diagnosis not present

## 2023-05-08 DIAGNOSIS — Z7902 Long term (current) use of antithrombotics/antiplatelets: Secondary | ICD-10-CM | POA: Diagnosis not present

## 2023-05-08 DIAGNOSIS — I130 Hypertensive heart and chronic kidney disease with heart failure and stage 1 through stage 4 chronic kidney disease, or unspecified chronic kidney disease: Secondary | ICD-10-CM | POA: Diagnosis not present

## 2023-05-08 DIAGNOSIS — N401 Enlarged prostate with lower urinary tract symptoms: Secondary | ICD-10-CM | POA: Diagnosis not present

## 2023-05-08 DIAGNOSIS — I700 Atherosclerosis of aorta: Secondary | ICD-10-CM | POA: Diagnosis not present

## 2023-05-08 DIAGNOSIS — K5790 Diverticulosis of intestine, part unspecified, without perforation or abscess without bleeding: Secondary | ICD-10-CM | POA: Diagnosis not present

## 2023-05-08 DIAGNOSIS — K219 Gastro-esophageal reflux disease without esophagitis: Secondary | ICD-10-CM | POA: Diagnosis not present

## 2023-05-08 DIAGNOSIS — R519 Headache, unspecified: Secondary | ICD-10-CM | POA: Diagnosis not present

## 2023-05-08 DIAGNOSIS — R21 Rash and other nonspecific skin eruption: Secondary | ICD-10-CM | POA: Diagnosis not present

## 2023-05-08 DIAGNOSIS — F4310 Post-traumatic stress disorder, unspecified: Secondary | ICD-10-CM | POA: Diagnosis not present

## 2023-05-08 DIAGNOSIS — I5042 Chronic combined systolic (congestive) and diastolic (congestive) heart failure: Secondary | ICD-10-CM | POA: Diagnosis not present

## 2023-05-08 DIAGNOSIS — Z955 Presence of coronary angioplasty implant and graft: Secondary | ICD-10-CM | POA: Diagnosis not present

## 2023-05-08 DIAGNOSIS — F32A Depression, unspecified: Secondary | ICD-10-CM | POA: Diagnosis not present

## 2023-05-08 DIAGNOSIS — F0393 Unspecified dementia, unspecified severity, with mood disturbance: Secondary | ICD-10-CM | POA: Diagnosis not present

## 2023-05-08 DIAGNOSIS — D500 Iron deficiency anemia secondary to blood loss (chronic): Secondary | ICD-10-CM | POA: Diagnosis not present

## 2023-05-08 DIAGNOSIS — E782 Mixed hyperlipidemia: Secondary | ICD-10-CM | POA: Diagnosis not present

## 2023-05-08 DIAGNOSIS — I255 Ischemic cardiomyopathy: Secondary | ICD-10-CM | POA: Diagnosis not present

## 2023-05-08 DIAGNOSIS — N39498 Other specified urinary incontinence: Secondary | ICD-10-CM | POA: Diagnosis not present

## 2023-05-08 DIAGNOSIS — N1832 Chronic kidney disease, stage 3b: Secondary | ICD-10-CM | POA: Diagnosis not present

## 2023-05-08 DIAGNOSIS — J181 Lobar pneumonia, unspecified organism: Secondary | ICD-10-CM | POA: Diagnosis not present

## 2023-05-08 DIAGNOSIS — C9111 Chronic lymphocytic leukemia of B-cell type in remission: Secondary | ICD-10-CM | POA: Diagnosis not present

## 2023-05-08 DIAGNOSIS — N179 Acute kidney failure, unspecified: Secondary | ICD-10-CM | POA: Diagnosis not present

## 2023-05-10 DIAGNOSIS — I5042 Chronic combined systolic (congestive) and diastolic (congestive) heart failure: Secondary | ICD-10-CM | POA: Diagnosis not present

## 2023-05-10 DIAGNOSIS — F0393 Unspecified dementia, unspecified severity, with mood disturbance: Secondary | ICD-10-CM | POA: Diagnosis not present

## 2023-05-10 DIAGNOSIS — R519 Headache, unspecified: Secondary | ICD-10-CM | POA: Diagnosis not present

## 2023-05-10 DIAGNOSIS — K219 Gastro-esophageal reflux disease without esophagitis: Secondary | ICD-10-CM | POA: Diagnosis not present

## 2023-05-10 DIAGNOSIS — I2510 Atherosclerotic heart disease of native coronary artery without angina pectoris: Secondary | ICD-10-CM | POA: Diagnosis not present

## 2023-05-10 DIAGNOSIS — I252 Old myocardial infarction: Secondary | ICD-10-CM | POA: Diagnosis not present

## 2023-05-10 DIAGNOSIS — K5790 Diverticulosis of intestine, part unspecified, without perforation or abscess without bleeding: Secondary | ICD-10-CM | POA: Diagnosis not present

## 2023-05-10 DIAGNOSIS — Z7902 Long term (current) use of antithrombotics/antiplatelets: Secondary | ICD-10-CM | POA: Diagnosis not present

## 2023-05-10 DIAGNOSIS — E782 Mixed hyperlipidemia: Secondary | ICD-10-CM | POA: Diagnosis not present

## 2023-05-10 DIAGNOSIS — I700 Atherosclerosis of aorta: Secondary | ICD-10-CM | POA: Diagnosis not present

## 2023-05-10 DIAGNOSIS — C9111 Chronic lymphocytic leukemia of B-cell type in remission: Secondary | ICD-10-CM | POA: Diagnosis not present

## 2023-05-10 DIAGNOSIS — N179 Acute kidney failure, unspecified: Secondary | ICD-10-CM | POA: Diagnosis not present

## 2023-05-10 DIAGNOSIS — I130 Hypertensive heart and chronic kidney disease with heart failure and stage 1 through stage 4 chronic kidney disease, or unspecified chronic kidney disease: Secondary | ICD-10-CM | POA: Diagnosis not present

## 2023-05-10 DIAGNOSIS — N401 Enlarged prostate with lower urinary tract symptoms: Secondary | ICD-10-CM | POA: Diagnosis not present

## 2023-05-10 DIAGNOSIS — R21 Rash and other nonspecific skin eruption: Secondary | ICD-10-CM | POA: Diagnosis not present

## 2023-05-10 DIAGNOSIS — F32A Depression, unspecified: Secondary | ICD-10-CM | POA: Diagnosis not present

## 2023-05-10 DIAGNOSIS — R1310 Dysphagia, unspecified: Secondary | ICD-10-CM | POA: Diagnosis not present

## 2023-05-10 DIAGNOSIS — N1832 Chronic kidney disease, stage 3b: Secondary | ICD-10-CM | POA: Diagnosis not present

## 2023-05-10 DIAGNOSIS — N39498 Other specified urinary incontinence: Secondary | ICD-10-CM | POA: Diagnosis not present

## 2023-05-10 DIAGNOSIS — I255 Ischemic cardiomyopathy: Secondary | ICD-10-CM | POA: Diagnosis not present

## 2023-05-10 DIAGNOSIS — J181 Lobar pneumonia, unspecified organism: Secondary | ICD-10-CM | POA: Diagnosis not present

## 2023-05-10 DIAGNOSIS — Z955 Presence of coronary angioplasty implant and graft: Secondary | ICD-10-CM | POA: Diagnosis not present

## 2023-05-10 DIAGNOSIS — F4310 Post-traumatic stress disorder, unspecified: Secondary | ICD-10-CM | POA: Diagnosis not present

## 2023-05-10 DIAGNOSIS — D500 Iron deficiency anemia secondary to blood loss (chronic): Secondary | ICD-10-CM | POA: Diagnosis not present

## 2023-05-15 DIAGNOSIS — H43393 Other vitreous opacities, bilateral: Secondary | ICD-10-CM | POA: Diagnosis not present

## 2023-05-16 DIAGNOSIS — I5042 Chronic combined systolic (congestive) and diastolic (congestive) heart failure: Secondary | ICD-10-CM | POA: Diagnosis not present

## 2023-05-16 DIAGNOSIS — I130 Hypertensive heart and chronic kidney disease with heart failure and stage 1 through stage 4 chronic kidney disease, or unspecified chronic kidney disease: Secondary | ICD-10-CM | POA: Diagnosis not present

## 2023-05-16 DIAGNOSIS — J181 Lobar pneumonia, unspecified organism: Secondary | ICD-10-CM | POA: Diagnosis not present

## 2023-05-16 DIAGNOSIS — F0393 Unspecified dementia, unspecified severity, with mood disturbance: Secondary | ICD-10-CM | POA: Diagnosis not present

## 2023-05-16 DIAGNOSIS — F4310 Post-traumatic stress disorder, unspecified: Secondary | ICD-10-CM | POA: Diagnosis not present

## 2023-05-16 DIAGNOSIS — R21 Rash and other nonspecific skin eruption: Secondary | ICD-10-CM | POA: Diagnosis not present

## 2023-05-16 DIAGNOSIS — I252 Old myocardial infarction: Secondary | ICD-10-CM | POA: Diagnosis not present

## 2023-05-16 DIAGNOSIS — C9111 Chronic lymphocytic leukemia of B-cell type in remission: Secondary | ICD-10-CM | POA: Diagnosis not present

## 2023-05-16 DIAGNOSIS — Z7902 Long term (current) use of antithrombotics/antiplatelets: Secondary | ICD-10-CM | POA: Diagnosis not present

## 2023-05-16 DIAGNOSIS — N39498 Other specified urinary incontinence: Secondary | ICD-10-CM | POA: Diagnosis not present

## 2023-05-16 DIAGNOSIS — K219 Gastro-esophageal reflux disease without esophagitis: Secondary | ICD-10-CM | POA: Diagnosis not present

## 2023-05-16 DIAGNOSIS — D500 Iron deficiency anemia secondary to blood loss (chronic): Secondary | ICD-10-CM | POA: Diagnosis not present

## 2023-05-16 DIAGNOSIS — K5790 Diverticulosis of intestine, part unspecified, without perforation or abscess without bleeding: Secondary | ICD-10-CM | POA: Diagnosis not present

## 2023-05-16 DIAGNOSIS — N401 Enlarged prostate with lower urinary tract symptoms: Secondary | ICD-10-CM | POA: Diagnosis not present

## 2023-05-16 DIAGNOSIS — E782 Mixed hyperlipidemia: Secondary | ICD-10-CM | POA: Diagnosis not present

## 2023-05-16 DIAGNOSIS — N179 Acute kidney failure, unspecified: Secondary | ICD-10-CM | POA: Diagnosis not present

## 2023-05-16 DIAGNOSIS — R1310 Dysphagia, unspecified: Secondary | ICD-10-CM | POA: Diagnosis not present

## 2023-05-16 DIAGNOSIS — R519 Headache, unspecified: Secondary | ICD-10-CM | POA: Diagnosis not present

## 2023-05-16 DIAGNOSIS — I2510 Atherosclerotic heart disease of native coronary artery without angina pectoris: Secondary | ICD-10-CM | POA: Diagnosis not present

## 2023-05-16 DIAGNOSIS — F32A Depression, unspecified: Secondary | ICD-10-CM | POA: Diagnosis not present

## 2023-05-16 DIAGNOSIS — I255 Ischemic cardiomyopathy: Secondary | ICD-10-CM | POA: Diagnosis not present

## 2023-05-16 DIAGNOSIS — I700 Atherosclerosis of aorta: Secondary | ICD-10-CM | POA: Diagnosis not present

## 2023-05-16 DIAGNOSIS — N1832 Chronic kidney disease, stage 3b: Secondary | ICD-10-CM | POA: Diagnosis not present

## 2023-05-16 DIAGNOSIS — Z955 Presence of coronary angioplasty implant and graft: Secondary | ICD-10-CM | POA: Diagnosis not present

## 2023-05-17 ENCOUNTER — Encounter: Payer: Self-pay | Admitting: Neurology

## 2023-05-17 ENCOUNTER — Ambulatory Visit: Payer: Medicare HMO | Admitting: Neurology

## 2023-05-17 VITALS — BP 159/88 | HR 67 | Ht 63.0 in | Wt 163.0 lb

## 2023-05-17 DIAGNOSIS — G301 Alzheimer's disease with late onset: Secondary | ICD-10-CM

## 2023-05-17 DIAGNOSIS — F01B0 Vascular dementia, moderate, without behavioral disturbance, psychotic disturbance, mood disturbance, and anxiety: Secondary | ICD-10-CM | POA: Diagnosis not present

## 2023-05-17 DIAGNOSIS — F02B0 Dementia in other diseases classified elsewhere, moderate, without behavioral disturbance, psychotic disturbance, mood disturbance, and anxiety: Secondary | ICD-10-CM | POA: Diagnosis not present

## 2023-05-17 NOTE — Patient Instructions (Addendum)
ATN profile to look for Alzheimer disease biomarker Continue with Aricept 10 mg nightly Continue with Namenda 10 mg twice daily Continue your other medications Continue to follow with PCP and return if worse or any other new behavior disturbance.

## 2023-05-17 NOTE — Progress Notes (Addendum)
[facility]  PATIENT: [name] DOB: 04-06-1946  REQUESTING CLINICIAN: Benita Stabile, MD HISTORY FROM: Spouse  REASON FOR VISIT: Dementia    HISTORICAL  CHIEF COMPLAINT:  Chief Complaint  Patient presents with   New Patient (Initial Visit)    Rm12, wife present, memory issues: mmse was unable to complete     HISTORY OF PRESENT ILLNESS:  This is a 77 year old gentleman past medical history of dementia who is presenting with wife for management of his dementia.  History is mainly obtained from wife.  She tells me that his memory problems started in 2018.  In 2020 he got COVID, which made his memory worse.  Currently patient is completely dependent on wife.  He does not cook or clean, wife helps him with bathing and putting his clothes on. He does not drive and does not handle the bills or medications.  He is already on Aricept 5 mg nightly and Namenda 10 mg twice daily.  He has difficulty with recent conversation, wife tells me that he repeated himself, can asked the same question multiple times a day.    TBI:   No past history of TBI Stroke:   no past history of stroke Seizures:   no past history of seizures Sleep:   no history of sleep apnea.  Mood: Yes, per wife he has anxiety and depression Family history of Dementia: Not sure   Functional status: Dependent in all ADLs and IADLs Patient lives with spouse. Cooking: no Cleaning: no Shopping: no Bathing: wife helps Toileting: wife helps  Driving: no Bills: no, wife took over  Medications: wife helps  Ever left the stove on by accident?: does not help Forget how to use items around the house?: yes Getting lost going to familiar places?: yes Forgetting loved ones names?: yes Word finding difficulty? yes Sleep: Good    OTHER MEDICAL CONDITIONS: Hypertension, hyperlipidemia, heart disease   REVIEW OF SYSTEMS: Full 14 system review of systems performed and negative with exception of: As noted  in the HPI   ALLERGIES: No Known Allergies  HOME MEDICATIONS: Outpatient Medications Prior to Visit  Medication Sig Dispense Refill   atorvastatin (LIPITOR) 40 MG tablet Take 1 tablet (40 mg total) by mouth daily. 90 tablet 1   azithromycin (ZITHROMAX) 500 MG tablet Take 1 tablet (500 mg total) by mouth daily. 4 tablet 0   cefdinir (OMNICEF) 300 MG capsule Take 1 capsule (300 mg total) by mouth every 12 (twelve) hours. 8 capsule 0   clopidogrel (PLAVIX) 75 MG tablet TAKE 1 TABLET BY MOUTH EVERY DAY (Patient taking differently: Take 75 mg by mouth daily.) 90 tablet 1   [employer] VITAMIN B12 1000 MCG TBCR TAKE 1 TABLET BY MOUTH EVERY DAY 60 tablet 3   donepezil (ARICEPT) 5 MG tablet Take 1 tablet by mouth daily.     furosemide (LASIX) 20 MG tablet Take 1 tablet (20 mg total) by mouth every Monday, Wednesday, and Friday. 90 tablet 3   ketoconazole (NIZORAL) 2 % cream Apply 1 Application topically daily as needed for irritation.     lisinopril (ZESTRIL) 2.5 MG tablet Take 1 tablet (2.5 mg total) by mouth every Tuesday, Thursday, Saturday, and Sunday. 90 tablet 3   memantine (NAMENDA) 5 MG tablet Take 5 mg by mouth 2 (two) times daily.     nitroGLYCERIN (NITROSTAT) 0.4 MG SL tablet Place 0.4 mg under the tongue every 5 (five) minutes as needed for chest pain.  ondansetron (ZOFRAN-ODT) 4 MG disintegrating tablet Take 1 tablet (4 mg total) by mouth every 8 (eight) hours as needed for nausea or vomiting. 15 tablet 0   pantoprazole (PROTONIX) 40 MG tablet TAKE 1 TABLET BY MOUTH EVERY DAY 90 tablet 2   No facility-administered medications prior to visit.    PAST MEDICAL HISTORY: Past Medical History:  Diagnosis Date   Acute MI, inferolateral wall, initial episode of care (HCC) 06/16/2015   99% dCx    Acute respiratory failure with hypoxia (HCC) 05/22/2019   BPH (benign prostatic hyperplasia) 04/08/2011   CAD S/P percutaneous coronary angioplasty 11/23/12; 06/16/15   a. NSTEMI 2014 with 2  overlapping mRCA lesions - Xience Xpedition DES 2.75 mm x 18 mm (3.0 mm). b. Inf-Lat STEMI 06/16/2015 - dCx Asp Thrombectomy & PCI 3.0 x 15 Xience drDES (3.6 mm).   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     Stable.  No melena, hematochezia or hematuria.     Ischemic cardiomyopathy    Echo 06/18/2015 EF 35-40% after lateral MI -> repeat echo 08/06/2015: EF 40-45% with basal-mid inferolateral akinesis and hypokinesis of the lateral wall.   Memory loss 07/09/2019   Near syncope 11/08/2022   NSTEMI (non-ST elevated myocardial infarction) (HCC) 11/23/2012   NSTEMI 11/23/2012 RCA lesion - PCI with DES; moderate LAD disease   PTSD (post-traumatic stress disorder)    With Depression - since MI   Recurrent coronary arteriosclerosis after percutaneous transluminal coronary angioplasty 11/24/2012   PCI of mid RCA -- Xience eXp 2.75 mm x 18 mm (post-dil to 3.1 mm) 2014  PCI of CFX with DES 06/16/15     Last Assessment & Plan:    DES stents in the RCA and  LCx.  No recurrent angina symptoms.     Plan: Continue monotherapy with Plavix.   Okay to hold Plavix 5 days preop for surgeries or procedures.      PAST SURGICAL HISTORY: Past Surgical History:  Procedure Laterality Date   APPENDECTOMY  1968-70   CARDIAC CATHETERIZATION N/A 06/16/2015   Procedure: Left Heart Cath and Coronary Angiography;  Surgeon: Corky Crafts, MD;  Location: [facility] INVASIVE CV LAB;  Service: Cardiovascular: 99% thrombotic dCx -> asp thrombectomy & PCI. RCA Stents Patent.    CARDIAC CATHETERIZATION N/A 06/16/2015   Procedure: Coronary Stent Intervention;  Surgeon: Corky Crafts, MD;  Location: [facility] INVASIVE CV LAB;  Service: Cardiovascular: PCI dCx = thrombectomy -> 3.0 x 15 Xience drug-eluting stent (3.6  mm)    COLONOSCOPY  2008   WUJ:WJXB-JYNWG diverticula, diminutive polyp in the cecum, s/p bx Normal rectum. Tubular adenoma   COLONOSCOPY N/A 10/17/2012   Dr. Jena Gauss: colonic diverticulosis, tubular adenoma, surveillance due May 2019   COLONOSCOPY N/A 02/21/2018   Procedure: COLONOSCOPY;  Surgeon: Corbin Ade, MD; Diverticulosis of the sigmoid and descending colon, otherwise normal exam.  No recommendations to repeat.   CORONARY STENT PLACEMENT  11/23/2012   Mid RCA -- Xience eXp - 2.75 mm x 18 mm DES (3.97mm) , Dr. Herbie Baltimore   ESOPHAGOGASTRODUODENOSCOPY N/A 03/03/2021   Procedure: ESOPHAGOGASTRODUODENOSCOPY (EGD);  Surgeon: Corbin Ade, MD;  Location: [facility];  Service: Endoscopy;  Laterality: N/A;  12:00pm   IR IVC FILTER PLMT / S&I /IMG GUID/MOD SED  05/27/2019   IR RADIOLOGIST EVAL & MGMT  09/11/2019   LEFT HEART CATHETERIZATION WITH CORONARY ANGIOGRAM N/A 11/23/2012   Procedure: LEFT HEART CATHETERIZATION WITH CORONARY ANGIOGRAM;  Surgeon: Marykay Lex, MD;  Location: [facility] CATH LAB;  Service: Cardiovascular: NSTEMI: Culprit = mid RCA 95% & 75%; LAD ~40-50%, Small RI ~60%; EF ~60%, mild inf-basal HK      MALONEY DILATION N/A 03/03/2021   Procedure:  MALONEY DILATION;  Surgeon: Corbin Ade, MD;  Location: [facility];  Service: Endoscopy;  Laterality: N/A;   TRANSTHORACIC ECHOCARDIOGRAM  January 2017; March 2017   a. EF 35-40%. Entire inferior hypokinesis and akinesis of the basal and mid inferolateral and anterolateral /distal lateral walls;; b. EF 40 and 45%. Akinesis of basal-mid inferolateral wall. Hypokinesis of entire lateral wall.    FAMILY HISTORY: Family History  Problem Relation Age of Onset   Diabetes Mother    Cancer Father        prostate   Colon cancer Neg Hx    Esophageal cancer Neg Hx    Stomach cancer Neg Hx     SOCIAL HISTORY: Social History   Socioeconomic History   Marital status: Married    Spouse name: Not on file   Number of children: Not on file   Years of education: Not on file   Highest education level: Not on file  Occupational History   Not on file  Tobacco Use   Smoking status: Former    Current packs/day: 0.00    Average packs/day: 0.3 packs/day for 2.0 years (0.5 ttl pk-yrs)    Types: Cigarettes    Start date: 06/06/1961    Quit date: 06/07/1963    Years since quitting: 59.9   Smokeless tobacco: Never  Vaping Use   Vaping status: Never Used  Substance and Sexual Activity   Alcohol use: No    Alcohol/week: 0.0 standard drinks of alcohol    Comment: Occasionally drinks red wine   Drug use: No   Sexual activity: Not on file  Other Topics Concern   Not on file  Social History Narrative   Exercises 3 to 4 days a week at the fitness center for about a half an hour at a time.   "former smoker". Does not drink EtOH   Married.   Social Determinants of Health   Financial Resource Strain: Not on file  Food Insecurity: No Food Insecurity (03/25/2023)   Hunger Vital Sign    Worried About Running Out of Food in the Last Year: Never true    Ran Out of Food in the Last Year: Never true  Transportation Needs: No Transportation Needs (03/25/2023)   PRAPARE - Transportation    Lack  of  Transportation (Medical): No    Lack of Transportation (Non-Medical): No  Physical Activity: Not on file  Stress: Not on file  Social Connections: Not on file  Intimate Partner Violence: Not At Risk (03/25/2023)   Humiliation, Afraid, Rape, and Kick questionnaire    Fear of Current or Ex-Partner: No    Emotionally Abused: No    Physically Abused: No    Sexually Abused: No    PHYSICAL EXAM   GENERAL EXAM/CONSTITUTIONAL: Vitals:  Vitals:   05/17/23 1021  BP: (!) 159/88  Pulse: 67  Weight: 163 lb (73.9 kg)  Height: 5\' 3"  (1.6 m)   Body mass index is 28.87 kg/m. Wt Readings from Last 3 Encounters:  05/17/23 163 lb (73.9 kg)  03/27/23 171 lb 1.2 oz (77.6 kg)  02/22/23 168 lb 6.4 oz (76.4 kg)   Patient is in no distress; well developed, nourished and groomed; neck is supple  MUSCULOSKELETAL: Gait, strength, tone, movements noted in Neurologic exam below  NEUROLOGIC: MENTAL STATUS:     05/17/2023   10:22 AM  MMSE - Mini Mental State Exam  Not completed: Unable to complete   awake, alert, oriented to person,  Difficulty with naming simple objects, like thumb, index finger, button Able for follow simple commands.    CRANIAL NERVE:  2nd, 3rd, 4th, 6th- visual fields full to confrontation, extraocular muscles intact, no nystagmus 5th - facial sensation symmetric 7th - facial strength symmetric 8th - hearing intact 9th - palate elevates symmetrically, uvula midline 11th - shoulder shrug symmetric 12th - tongue protrusion midline  MOTOR:  normal bulk and tone, full strength in the BUE, BLE  SENSORY:  normal and symmetric to light touch  COORDINATION:  finger-nose-finger, fine finger movements normal  GAIT/STATION:  Walks with a walker     DIAGNOSTIC DATA (LABS, IMAGING, TESTING) - I reviewed patient records, labs, notes, testing and imaging myself where available.  Lab Results  Component Value Date   WBC 22.3 (H) 03/28/2023   HGB 12.7 (L) 03/28/2023    HCT 40.8 03/28/2023   MCV 95.6 03/28/2023   PLT 171 03/28/2023      Component Value Date/Time   NA 136 03/28/2023 0432   NA 141 01/13/2016 1027   K 3.6 03/28/2023 0432   CL 109 03/28/2023 0432   CO2 22 03/28/2023 0432   GLUCOSE 120 (H) 03/28/2023 0432   BUN 13 03/28/2023 0432   BUN 16 01/13/2016 1027   CREATININE 1.24 03/28/2023 0432   CREATININE 1.59 (H) 11/18/2020 1104   CALCIUM 7.8 (L) 03/28/2023 0432   CALCIUM 9.1 02/17/2023 1101   PROT 5.4 (L) 03/26/2023 0328   PROT 6.4 12/14/2016 1007   ALBUMIN 3.0 (L) 03/26/2023 0328   ALBUMIN 4.2 12/14/2016 1007   AST 21 03/26/2023 0328   ALT 11 03/26/2023 0328   ALKPHOS 64 03/26/2023 0328   BILITOT 1.9 (H) 03/26/2023 0328   BILITOT 0.5 12/14/2016 1007   GFRNONAA 60 (L) 03/28/2023 0432   GFRNONAA 42 (L) 11/18/2020 1104   GFRAA 48 (L) 11/18/2020 1104   Lab Results  Component Value Date   CHOL 133 11/18/2020   HDL 28 (L) 11/18/2020   LDLCALC 81 11/18/2020   TRIG 140 11/18/2020   CHOLHDL 4.8 11/18/2020   Lab Results  Component Value Date   HGBA1C 6.8 (H) 05/23/2019   Lab Results  Component Value Date   VITAMINB12 514 10/14/2019   Lab Results  Component Value Date   TSH 1.19 07/09/2019  CT Head 2020 No evidence of acute intracranial injury or acute cervical spine fracture. Moderate chronic microvascular ischemic changes    ASSESSMENT AND PLAN  77 y.o. year old male with history of hypertension, hyperlipidemia, heart disease, dementia who is presenting for further evaluation and management of his dementia.  Based on history, with constant memory decline for the past 6 years, patient likely has Alzheimer dementia.  Today on exam he could not complete the Mini-Mental status evaluation, he cannot provide a history, has difficulty naming simple items such as thumb, index finger, or button.  He is completely dependent on wife for ADLs.  Patient likely has moderate to severe dementia, will obtain ATN profile to look for  Alzheimer disease biomarker to support the diagnosis of Alzheimer disease.  In terms of medication, patient is already on Aricept 10 mg nightly and Namenda 10 mg twice daily.  At this time, I have recommended to continue on the current medications, continue to follow with PCP I will call him to go over the results.  If there is any worsening of his behavior they can contact us for additional recommendation.   1. Moderate late onset Alzheimer's dementia without behavioral disturbance, psychotic disturbance, mood disturbance, or anxiety (HCC)      Patient Instructions  ATN profile to look for Alzheimer disease biomarker Continue with Aricept 10 mg nightly Continue with Namenda 10 mg twice daily Continue your other medications Continue to follow with PCP and return if worse or any other new behavior disturbance.  Orders Placed This Encounter  Procedures   CT HEAD WO CONTRAST ( )   ATN PROFILE    No orders of the defined types were placed in this encounter.   Return if symptoms worsen or fail to improve.    Windell Norfolk, MD 05/17/2023, 9:30 PM  [facility] [address] [place], [place] [address] [phone]

## 2023-05-20 LAB — ATN PROFILE
A -- Beta-amyloid 42/40 Ratio: 0.124 (ref >0.102)
Beta-amyloid 40: 159.97 pg/mL
Beta-amyloid 42: 19.78 pg/mL
N -- NfL, Plasma: 16.5 pg/mL — ABNORMAL HIGH (ref 0.00–7.64)
T -- p-tau181: 3.32 pg/mL — ABNORMAL HIGH (ref 0.00–0.97)

## 2023-05-22 ENCOUNTER — Telehealth: Payer: Self-pay | Admitting: Neurology

## 2023-05-22 NOTE — Progress Notes (Signed)
Please call and advise the patient that the recent labs we checked did not show evidence of Alzheimer disease biomarkers. He still has dementia, possibly Vascular dementia. No further action is required on these tests at this time. Please remind patient to continue current medications, keep any upcoming appointments or tests and to call us with any interim questions, concerns, problems or updates. Thanks,   Windell Norfolk, MD

## 2023-05-22 NOTE — Telephone Encounter (Signed)
sent to GI they obtain [employer] medicare auth [phone] 

## 2023-05-23 ENCOUNTER — Telehealth: Payer: Self-pay | Admitting: Neurology

## 2023-05-23 NOTE — Telephone Encounter (Signed)
She is referring to CT, does location need to be changed or reordered  ?

## 2023-05-23 NOTE — Telephone Encounter (Signed)
[address] medicare Berkley Harvey: [id number] exp. 05/23/23-11/19/23 sent to [facility] [phone]

## 2023-05-23 NOTE — Telephone Encounter (Signed)
Pt's wife requesting if MRI can be done at hospital in [place]. Requesting call back to discuss

## 2023-05-24 DIAGNOSIS — E782 Mixed hyperlipidemia: Secondary | ICD-10-CM | POA: Diagnosis not present

## 2023-05-24 DIAGNOSIS — D500 Iron deficiency anemia secondary to blood loss (chronic): Secondary | ICD-10-CM | POA: Diagnosis not present

## 2023-05-24 DIAGNOSIS — I5042 Chronic combined systolic (congestive) and diastolic (congestive) heart failure: Secondary | ICD-10-CM | POA: Diagnosis not present

## 2023-05-24 DIAGNOSIS — I252 Old myocardial infarction: Secondary | ICD-10-CM | POA: Diagnosis not present

## 2023-05-24 DIAGNOSIS — K5790 Diverticulosis of intestine, part unspecified, without perforation or abscess without bleeding: Secondary | ICD-10-CM | POA: Diagnosis not present

## 2023-05-24 DIAGNOSIS — I255 Ischemic cardiomyopathy: Secondary | ICD-10-CM | POA: Diagnosis not present

## 2023-05-24 DIAGNOSIS — I130 Hypertensive heart and chronic kidney disease with heart failure and stage 1 through stage 4 chronic kidney disease, or unspecified chronic kidney disease: Secondary | ICD-10-CM | POA: Diagnosis not present

## 2023-05-24 DIAGNOSIS — N39498 Other specified urinary incontinence: Secondary | ICD-10-CM | POA: Diagnosis not present

## 2023-05-24 DIAGNOSIS — Z7902 Long term (current) use of antithrombotics/antiplatelets: Secondary | ICD-10-CM | POA: Diagnosis not present

## 2023-05-24 DIAGNOSIS — F4310 Post-traumatic stress disorder, unspecified: Secondary | ICD-10-CM | POA: Diagnosis not present

## 2023-05-24 DIAGNOSIS — R1310 Dysphagia, unspecified: Secondary | ICD-10-CM | POA: Diagnosis not present

## 2023-05-24 DIAGNOSIS — R21 Rash and other nonspecific skin eruption: Secondary | ICD-10-CM | POA: Diagnosis not present

## 2023-05-24 DIAGNOSIS — I700 Atherosclerosis of aorta: Secondary | ICD-10-CM | POA: Diagnosis not present

## 2023-05-24 DIAGNOSIS — J181 Lobar pneumonia, unspecified organism: Secondary | ICD-10-CM | POA: Diagnosis not present

## 2023-05-24 DIAGNOSIS — F0393 Unspecified dementia, unspecified severity, with mood disturbance: Secondary | ICD-10-CM | POA: Diagnosis not present

## 2023-05-24 DIAGNOSIS — K219 Gastro-esophageal reflux disease without esophagitis: Secondary | ICD-10-CM | POA: Diagnosis not present

## 2023-05-24 DIAGNOSIS — F32A Depression, unspecified: Secondary | ICD-10-CM | POA: Diagnosis not present

## 2023-05-24 DIAGNOSIS — C9111 Chronic lymphocytic leukemia of B-cell type in remission: Secondary | ICD-10-CM | POA: Diagnosis not present

## 2023-05-24 DIAGNOSIS — N401 Enlarged prostate with lower urinary tract symptoms: Secondary | ICD-10-CM | POA: Diagnosis not present

## 2023-05-24 DIAGNOSIS — N1832 Chronic kidney disease, stage 3b: Secondary | ICD-10-CM | POA: Diagnosis not present

## 2023-05-24 DIAGNOSIS — R519 Headache, unspecified: Secondary | ICD-10-CM | POA: Diagnosis not present

## 2023-05-24 DIAGNOSIS — N179 Acute kidney failure, unspecified: Secondary | ICD-10-CM | POA: Diagnosis not present

## 2023-05-24 DIAGNOSIS — Z955 Presence of coronary angioplasty implant and graft: Secondary | ICD-10-CM | POA: Diagnosis not present

## 2023-05-24 DIAGNOSIS — I2510 Atherosclerotic heart disease of native coronary artery without angina pectoris: Secondary | ICD-10-CM | POA: Diagnosis not present

## 2023-05-26 DIAGNOSIS — E782 Mixed hyperlipidemia: Secondary | ICD-10-CM | POA: Diagnosis not present

## 2023-05-26 DIAGNOSIS — C9111 Chronic lymphocytic leukemia of B-cell type in remission: Secondary | ICD-10-CM | POA: Diagnosis not present

## 2023-05-26 DIAGNOSIS — K219 Gastro-esophageal reflux disease without esophagitis: Secondary | ICD-10-CM | POA: Diagnosis not present

## 2023-05-26 DIAGNOSIS — F4310 Post-traumatic stress disorder, unspecified: Secondary | ICD-10-CM | POA: Diagnosis not present

## 2023-05-26 DIAGNOSIS — N401 Enlarged prostate with lower urinary tract symptoms: Secondary | ICD-10-CM | POA: Diagnosis not present

## 2023-05-26 DIAGNOSIS — I255 Ischemic cardiomyopathy: Secondary | ICD-10-CM | POA: Diagnosis not present

## 2023-05-26 DIAGNOSIS — Z7902 Long term (current) use of antithrombotics/antiplatelets: Secondary | ICD-10-CM | POA: Diagnosis not present

## 2023-05-26 DIAGNOSIS — R519 Headache, unspecified: Secondary | ICD-10-CM | POA: Diagnosis not present

## 2023-05-26 DIAGNOSIS — K5790 Diverticulosis of intestine, part unspecified, without perforation or abscess without bleeding: Secondary | ICD-10-CM | POA: Diagnosis not present

## 2023-05-26 DIAGNOSIS — N39498 Other specified urinary incontinence: Secondary | ICD-10-CM | POA: Diagnosis not present

## 2023-05-26 DIAGNOSIS — R21 Rash and other nonspecific skin eruption: Secondary | ICD-10-CM | POA: Diagnosis not present

## 2023-05-26 DIAGNOSIS — Z955 Presence of coronary angioplasty implant and graft: Secondary | ICD-10-CM | POA: Diagnosis not present

## 2023-05-26 DIAGNOSIS — F32A Depression, unspecified: Secondary | ICD-10-CM | POA: Diagnosis not present

## 2023-05-26 DIAGNOSIS — I5042 Chronic combined systolic (congestive) and diastolic (congestive) heart failure: Secondary | ICD-10-CM | POA: Diagnosis not present

## 2023-05-26 DIAGNOSIS — I2510 Atherosclerotic heart disease of native coronary artery without angina pectoris: Secondary | ICD-10-CM | POA: Diagnosis not present

## 2023-05-26 DIAGNOSIS — J181 Lobar pneumonia, unspecified organism: Secondary | ICD-10-CM | POA: Diagnosis not present

## 2023-05-26 DIAGNOSIS — N1832 Chronic kidney disease, stage 3b: Secondary | ICD-10-CM | POA: Diagnosis not present

## 2023-05-26 DIAGNOSIS — I252 Old myocardial infarction: Secondary | ICD-10-CM | POA: Diagnosis not present

## 2023-05-26 DIAGNOSIS — F0393 Unspecified dementia, unspecified severity, with mood disturbance: Secondary | ICD-10-CM | POA: Diagnosis not present

## 2023-05-26 DIAGNOSIS — D500 Iron deficiency anemia secondary to blood loss (chronic): Secondary | ICD-10-CM | POA: Diagnosis not present

## 2023-05-26 DIAGNOSIS — I130 Hypertensive heart and chronic kidney disease with heart failure and stage 1 through stage 4 chronic kidney disease, or unspecified chronic kidney disease: Secondary | ICD-10-CM | POA: Diagnosis not present

## 2023-05-26 DIAGNOSIS — R1310 Dysphagia, unspecified: Secondary | ICD-10-CM | POA: Diagnosis not present

## 2023-05-26 DIAGNOSIS — N179 Acute kidney failure, unspecified: Secondary | ICD-10-CM | POA: Diagnosis not present

## 2023-05-26 DIAGNOSIS — I700 Atherosclerosis of aorta: Secondary | ICD-10-CM | POA: Diagnosis not present

## 2023-05-29 DIAGNOSIS — I5022 Chronic systolic (congestive) heart failure: Secondary | ICD-10-CM | POA: Diagnosis not present

## 2023-05-29 DIAGNOSIS — C9110 Chronic lymphocytic leukemia of B-cell type not having achieved remission: Secondary | ICD-10-CM | POA: Diagnosis not present

## 2023-05-29 DIAGNOSIS — R6510 Systemic inflammatory response syndrome (SIRS) of non-infectious origin without acute organ dysfunction: Secondary | ICD-10-CM | POA: Diagnosis not present

## 2023-05-29 DIAGNOSIS — J181 Lobar pneumonia, unspecified organism: Secondary | ICD-10-CM | POA: Diagnosis not present

## 2023-06-06 ENCOUNTER — Ambulatory Visit (HOSPITAL_COMMUNITY)
Admission: RE | Admit: 2023-06-06 | Discharge: 2023-06-06 | Disposition: A | Discharge status: Home / Self Care | Payer: Medicare HMO | Source: Ambulatory Visit | Attending: Neurology | Admitting: Neurology

## 2023-06-06 DIAGNOSIS — R4182 Altered mental status, unspecified: Secondary | ICD-10-CM | POA: Diagnosis not present

## 2023-06-06 DIAGNOSIS — I672 Cerebral atherosclerosis: Secondary | ICD-10-CM | POA: Diagnosis not present

## 2023-06-06 DIAGNOSIS — F01B0 Vascular dementia, moderate, without behavioral disturbance, psychotic disturbance, mood disturbance, and anxiety: Secondary | ICD-10-CM | POA: Insufficient documentation

## 2023-06-06 DIAGNOSIS — R413 Other amnesia: Secondary | ICD-10-CM | POA: Diagnosis not present

## 2023-06-19 NOTE — Progress Notes (Signed)
 Please call and advise the patient that the recent head CT did not show any acute abnormalities such as stroke, or mass or blood products. No further action is required on this test at this time. Please remind patient to keep any upcoming appointments or tests and to call us  with any interim questions, concerns, problems or updates. Thanks,   Pastor Falling, MD

## 2023-06-21 DIAGNOSIS — I10 Essential (primary) hypertension: Secondary | ICD-10-CM | POA: Diagnosis not present

## 2023-06-21 DIAGNOSIS — R7303 Prediabetes: Secondary | ICD-10-CM | POA: Diagnosis not present

## 2023-06-22 LAB — LAB REPORT - SCANNED
A1c: 6.4
Albumin, Urine POC: 23.1
Creatinine, POC: 132.1 mg/dL
EGFR: 47
Microalb Creat Ratio: 17

## 2023-06-29 DIAGNOSIS — R6510 Systemic inflammatory response syndrome (SIRS) of non-infectious origin without acute organ dysfunction: Secondary | ICD-10-CM | POA: Diagnosis not present

## 2023-06-29 DIAGNOSIS — C9110 Chronic lymphocytic leukemia of B-cell type not having achieved remission: Secondary | ICD-10-CM | POA: Diagnosis not present

## 2023-06-29 DIAGNOSIS — I5022 Chronic systolic (congestive) heart failure: Secondary | ICD-10-CM | POA: Diagnosis not present

## 2023-06-29 DIAGNOSIS — J181 Lobar pneumonia, unspecified organism: Secondary | ICD-10-CM | POA: Diagnosis not present

## 2023-07-13 DIAGNOSIS — I2510 Atherosclerotic heart disease of native coronary artery without angina pectoris: Secondary | ICD-10-CM | POA: Diagnosis not present

## 2023-07-13 DIAGNOSIS — I130 Hypertensive heart and chronic kidney disease with heart failure and stage 1 through stage 4 chronic kidney disease, or unspecified chronic kidney disease: Secondary | ICD-10-CM | POA: Diagnosis not present

## 2023-07-13 DIAGNOSIS — D631 Anemia in chronic kidney disease: Secondary | ICD-10-CM | POA: Diagnosis not present

## 2023-07-13 DIAGNOSIS — I509 Heart failure, unspecified: Secondary | ICD-10-CM | POA: Diagnosis not present

## 2023-07-13 DIAGNOSIS — E785 Hyperlipidemia, unspecified: Secondary | ICD-10-CM | POA: Diagnosis not present

## 2023-07-13 DIAGNOSIS — I10 Essential (primary) hypertension: Secondary | ICD-10-CM | POA: Diagnosis not present

## 2023-07-13 DIAGNOSIS — Z23 Encounter for immunization: Secondary | ICD-10-CM | POA: Diagnosis not present

## 2023-07-13 DIAGNOSIS — C9110 Chronic lymphocytic leukemia of B-cell type not having achieved remission: Secondary | ICD-10-CM | POA: Diagnosis not present

## 2023-07-13 DIAGNOSIS — I82409 Acute embolism and thrombosis of unspecified deep veins of unspecified lower extremity: Secondary | ICD-10-CM | POA: Diagnosis not present

## 2023-07-13 DIAGNOSIS — N1832 Chronic kidney disease, stage 3b: Secondary | ICD-10-CM | POA: Diagnosis not present

## 2023-07-13 DIAGNOSIS — R7303 Prediabetes: Secondary | ICD-10-CM | POA: Diagnosis not present

## 2023-07-13 DIAGNOSIS — R1310 Dysphagia, unspecified: Secondary | ICD-10-CM | POA: Diagnosis not present

## 2023-07-30 DIAGNOSIS — C9110 Chronic lymphocytic leukemia of B-cell type not having achieved remission: Secondary | ICD-10-CM | POA: Diagnosis not present

## 2023-07-30 DIAGNOSIS — I5022 Chronic systolic (congestive) heart failure: Secondary | ICD-10-CM | POA: Diagnosis not present

## 2023-07-30 DIAGNOSIS — J181 Lobar pneumonia, unspecified organism: Secondary | ICD-10-CM | POA: Diagnosis not present

## 2023-07-30 DIAGNOSIS — R6510 Systemic inflammatory response syndrome (SIRS) of non-infectious origin without acute organ dysfunction: Secondary | ICD-10-CM | POA: Diagnosis not present

## 2023-08-17 ENCOUNTER — Other Ambulatory Visit: Payer: Self-pay

## 2023-08-17 DIAGNOSIS — D500 Iron deficiency anemia secondary to blood loss (chronic): Secondary | ICD-10-CM

## 2023-08-17 DIAGNOSIS — C9110 Chronic lymphocytic leukemia of B-cell type not having achieved remission: Secondary | ICD-10-CM

## 2023-08-17 DIAGNOSIS — N1831 Chronic kidney disease, stage 3a: Secondary | ICD-10-CM

## 2023-08-17 DIAGNOSIS — D649 Anemia, unspecified: Secondary | ICD-10-CM

## 2023-08-18 ENCOUNTER — Inpatient Hospital Stay: Payer: Medicare HMO | Attending: Hematology

## 2023-08-18 DIAGNOSIS — D649 Anemia, unspecified: Secondary | ICD-10-CM

## 2023-08-18 DIAGNOSIS — C9110 Chronic lymphocytic leukemia of B-cell type not having achieved remission: Secondary | ICD-10-CM | POA: Insufficient documentation

## 2023-08-18 DIAGNOSIS — Z7902 Long term (current) use of antithrombotics/antiplatelets: Secondary | ICD-10-CM | POA: Insufficient documentation

## 2023-08-18 DIAGNOSIS — Z87891 Personal history of nicotine dependence: Secondary | ICD-10-CM | POA: Insufficient documentation

## 2023-08-18 DIAGNOSIS — N183 Chronic kidney disease, stage 3 unspecified: Secondary | ICD-10-CM | POA: Diagnosis not present

## 2023-08-18 DIAGNOSIS — E611 Iron deficiency: Secondary | ICD-10-CM | POA: Insufficient documentation

## 2023-08-18 DIAGNOSIS — N1831 Chronic kidney disease, stage 3a: Secondary | ICD-10-CM

## 2023-08-18 DIAGNOSIS — Z79899 Other long term (current) drug therapy: Secondary | ICD-10-CM | POA: Insufficient documentation

## 2023-08-18 DIAGNOSIS — D631 Anemia in chronic kidney disease: Secondary | ICD-10-CM | POA: Insufficient documentation

## 2023-08-18 DIAGNOSIS — D500 Iron deficiency anemia secondary to blood loss (chronic): Secondary | ICD-10-CM

## 2023-08-18 DIAGNOSIS — Z86718 Personal history of other venous thrombosis and embolism: Secondary | ICD-10-CM | POA: Diagnosis not present

## 2023-08-18 LAB — IRON AND TIBC
Iron: 77 ug/dL (ref 45–182)
Saturation Ratios: 28 % (ref 17.9–39.5)
TIBC: 272 ug/dL (ref 250–450)
UIBC: 195 ug/dL

## 2023-08-18 LAB — CBC WITH DIFFERENTIAL/PLATELET
Abs Immature Granulocytes: 0 10*3/uL (ref 0.00–0.07)
Band Neutrophils: 1 %
Basophils Absolute: 0 10*3/uL (ref 0.0–0.1)
Basophils Relative: 0 %
Eosinophils Absolute: 0.3 10*3/uL (ref 0.0–0.5)
Eosinophils Relative: 1 %
HCT: 45.8 % (ref 39.0–52.0)
Hemoglobin: 14.2 g/dL (ref 13.0–17.0)
Lymphocytes Relative: 90 %
Lymphs Abs: 23.8 10*3/uL — ABNORMAL HIGH (ref 0.7–4.0)
MCH: 30.9 pg (ref 26.0–34.0)
MCHC: 31 g/dL (ref 30.0–36.0)
MCV: 99.6 fL (ref 80.0–100.0)
Monocytes Absolute: 0.3 10*3/uL (ref 0.1–1.0)
Monocytes Relative: 1 %
Neutro Abs: 2.1 10*3/uL (ref 1.7–7.7)
Neutrophils Relative %: 7 %
Platelets: 178 10*3/uL (ref 150–400)
RBC: 4.6 MIL/uL (ref 4.22–5.81)
RDW: 14.7 % (ref 11.5–15.5)
Smear Review: ADEQUATE
WBC: 26.4 10*3/uL — ABNORMAL HIGH (ref 4.0–10.5)
nRBC: 0 % (ref 0.0–0.2)

## 2023-08-18 LAB — COMPREHENSIVE METABOLIC PANEL
ALT: 12 U/L (ref 0–44)
AST: 23 U/L (ref 15–41)
Albumin: 3.8 g/dL (ref 3.5–5.0)
Alkaline Phosphatase: 84 U/L (ref 38–126)
Anion gap: 10 (ref 5–15)
BUN: 12 mg/dL (ref 8–23)
CO2: 24 mmol/L (ref 22–32)
Calcium: 9.3 mg/dL (ref 8.9–10.3)
Chloride: 105 mmol/L (ref 98–111)
Creatinine, Ser: 1.45 mg/dL — ABNORMAL HIGH (ref 0.61–1.24)
GFR, Estimated: 49 mL/min — ABNORMAL LOW (ref >60)
Glucose, Bld: 151 mg/dL — ABNORMAL HIGH (ref 70–99)
Potassium: 4.2 mmol/L (ref 3.5–5.1)
Sodium: 139 mmol/L (ref 135–145)
Total Bilirubin: 0.7 mg/dL (ref 0.0–1.2)
Total Protein: 6.8 g/dL (ref 6.5–8.1)

## 2023-08-18 LAB — LACTATE DEHYDROGENASE: LDH: 149 U/L (ref 98–192)

## 2023-08-18 LAB — FERRITIN: Ferritin: 220 ng/mL (ref 24–336)

## 2023-08-24 ENCOUNTER — Inpatient Hospital Stay (HOSPITAL_BASED_OUTPATIENT_CLINIC_OR_DEPARTMENT_OTHER): Admitting: Oncology

## 2023-08-24 VITALS — BP 118/63 | HR 61 | Temp 98.4°F | Resp 19 | Ht 63.0 in | Wt 164.0 lb

## 2023-08-24 DIAGNOSIS — Z79899 Other long term (current) drug therapy: Secondary | ICD-10-CM | POA: Diagnosis not present

## 2023-08-24 DIAGNOSIS — N1831 Chronic kidney disease, stage 3a: Secondary | ICD-10-CM | POA: Diagnosis not present

## 2023-08-24 DIAGNOSIS — Z86718 Personal history of other venous thrombosis and embolism: Secondary | ICD-10-CM | POA: Diagnosis not present

## 2023-08-24 DIAGNOSIS — D649 Anemia, unspecified: Secondary | ICD-10-CM

## 2023-08-24 DIAGNOSIS — Z87891 Personal history of nicotine dependence: Secondary | ICD-10-CM | POA: Diagnosis not present

## 2023-08-24 DIAGNOSIS — D500 Iron deficiency anemia secondary to blood loss (chronic): Secondary | ICD-10-CM

## 2023-08-24 DIAGNOSIS — C9110 Chronic lymphocytic leukemia of B-cell type not having achieved remission: Secondary | ICD-10-CM | POA: Diagnosis not present

## 2023-08-24 DIAGNOSIS — I739 Peripheral vascular disease, unspecified: Secondary | ICD-10-CM | POA: Diagnosis not present

## 2023-08-24 DIAGNOSIS — Z7902 Long term (current) use of antithrombotics/antiplatelets: Secondary | ICD-10-CM | POA: Diagnosis not present

## 2023-08-24 DIAGNOSIS — L603 Nail dystrophy: Secondary | ICD-10-CM | POA: Diagnosis not present

## 2023-08-24 DIAGNOSIS — D631 Anemia in chronic kidney disease: Secondary | ICD-10-CM | POA: Diagnosis not present

## 2023-08-24 DIAGNOSIS — N183 Chronic kidney disease, stage 3 unspecified: Secondary | ICD-10-CM | POA: Diagnosis not present

## 2023-08-24 DIAGNOSIS — E611 Iron deficiency: Secondary | ICD-10-CM | POA: Diagnosis not present

## 2023-08-24 NOTE — Progress Notes (Signed)
 [facility] [address] [place] [address]    Clinic Day:  08/24/2023  Referring physician: Benita Stabile, MD  Patient Care Team: Benita Stabile, MD as PCP - General (Internal Medicine) Marykay Lex, MD as PCP - Cardiology (Cardiology) Jena Gauss Gerrit Friends, MD as Attending Physician (Gastroenterology) Doreatha Massed, MD as Consulting Physician (Hematology and Oncology)   ASSESSMENT & PLAN:   Assessment: 1.  Stage I CLL: -Originally diagnosed in 2008, on watchful waiting since then. -No fevers, night sweats or weight loss.  No recurrent infections.   2.  DVT: -Diagnosed with DVT during hospitalization in December 2020.  IVC filter on 05/27/2019. -Anticoagulation was not started as he had GI bleed secondary to diverticulosis.   3.  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 -Venofer 5 doses from 07/23/2019 through 08/05/2019.  4.  CKD: -Followed by nephrology. -Appears stable. -Creatinine has been between 1.2-1.91 over the past several years.  Plan: 1.  Stage I CLL: - Denies any fevers, night sweats or weight loss in the last 6 months. -Wife reports a great appetite and no weight loss. -Reviewed labs from 08/18/2023 showed a white count of 26.4 (22.3).  Counts have varied from normal to as high as 34.0.  Absolute lymphocyte count is also elevated 23.8.  LDH is normal. -He had a CT abdomen pelvis without contrast on 08/15/2022 for weight loss which did not reveal any acute findings, adenopathy or mass. -Weight has been stable ever since. -Continue follow-up every 6 months.  2.  Dementia: -Currently on Namenda twice daily and is followed closely by neurology. -This appears to be worsening per his wife.  3.  Near syncopal episodes: -He was seen in the emergency room on 11/07/2022 for near syncope secondary to dehydration. -She has had several other incidents since then but has not fallen.  He met with his PCP yesterday who changed  his lisinopril and Lasix frequency.  He is now taking Lasix 3 times per week and lisinopril 4 times per week rotating to avoid hypotension.  Per his wife this appears to be helping. -Had near syncopal episode while in clinic today.  BP was stable.  I encouraged him to drink more fluids and to change positions slowly.  Dizziness improved after about 5 to 10 minutes of sitting drinking water and eating a cracker.   PLAN SUMMARY: >> RTC in 6 months with labs (cbc/d, cmp, ldh)  a few days before and see provider.        Orders Placed This Encounter  Procedures   CBC with Differential    Standing Status:   Future    Expected Date:   02/24/2024    Expiration Date:   08/23/2024   Comprehensive metabolic panel    Standing Status:   Future    Expected Date:   02/24/2024    Expiration Date:   08/23/2024   Lactate dehydrogenase    Standing Status:   Future    Expected Date:   02/24/2024    Expiration Date:   08/23/2024    Mauro Kaufmann, NP   3/20/20252:13 PM  CHIEF COMPLAINT:   Diagnosis: CLL and anemia   Prior Therapy: none  Current Therapy:  Observation and intermittent Venofer  INTERVAL HISTORY:   [name] is a 78 y.o. male presenting to clinic today for follow up of CLL and anemia.  He was last seen in clinic on 08/22/2022.  Patient was hospitalized from 03/24/2019 through  03/28/2023 for sepsis pneumonia.  He has since recovered.  Patient has had episodes of near syncopal events due to dehydration.  He was hospitalized back in June 2024 for near syncope.  During his visit today, patient was leaving the room after our visit to get his appointments and felt dizzy and needed to sit down.  Blood pressure was stable.  Symptoms improved after 5 to 10 minutes with water and crackers.  Today, he presents with his wife.  Wife states overall he has been doing well.  He is eating and drinking and maintaining his weight.  Denies any B symptoms.  Reports worsening memory since he had COVID back in  2020.  He is currently on Namenda twice daily and he is followed by neurology.   His appetite is 100% energy levels are 70%.  Denies any pain.   PAST MEDICAL HISTORY:   Past Medical History: Past Medical History:  Diagnosis Date   Acute MI, inferolateral wall, initial episode of care (HCC) 06/16/2015   99% dCx    Acute respiratory failure with hypoxia (HCC) 05/22/2019   BPH (benign prostatic hyperplasia) 04/08/2011   CAD S/P percutaneous coronary angioplasty 11/23/12; 06/16/15   a. NSTEMI 2014 with 2 overlapping mRCA lesions - Xience Xpedition DES 2.75 mm x 18 mm (3.0 mm). b. Inf-Lat STEMI 06/16/2015 - dCx Asp Thrombectomy & PCI 3.0 x 15 Xience drDES (3.6 mm).   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     Stable.  No melena, hematochezia or hematuria.     Ischemic cardiomyopathy    Echo 06/18/2015 EF 35-40% after lateral MI -> repeat echo 08/06/2015: EF 40-45% with basal-mid inferolateral akinesis and hypokinesis of the lateral wall.    Memory loss 07/09/2019   Near syncope 11/08/2022   NSTEMI (non-ST elevated myocardial infarction) (HCC) 11/23/2012   NSTEMI 11/23/2012 RCA lesion - PCI with DES; moderate LAD disease   PTSD (post-traumatic stress disorder)    With Depression - since MI   Recurrent coronary arteriosclerosis after percutaneous transluminal coronary angioplasty 11/24/2012   PCI of mid RCA -- Xience eXp 2.75 mm x 18 mm (post-dil to 3.1 mm) 2014  PCI of CFX with DES 06/16/15     Last Assessment & Plan:    DES stents in the RCA and LCx.  No recurrent angina symptoms.     Plan: Continue monotherapy with Plavix.   Okay to hold Plavix 5 days preop for surgeries or procedures.  Surgical History: Past Surgical History:  Procedure Laterality Date   APPENDECTOMY  1968-70   CARDIAC CATHETERIZATION N/A 06/16/2015   Procedure: Left Heart Cath and Coronary Angiography;  Surgeon: Corky Crafts, MD;  Location: [facility] INVASIVE CV LAB;  Service: Cardiovascular: 99% thrombotic dCx -> asp thrombectomy & PCI. RCA Stents Patent.    CARDIAC CATHETERIZATION N/A 06/16/2015   Procedure: Coronary Stent Intervention;  Surgeon: Corky Crafts, MD;  Location: [facility] INVASIVE CV LAB;  Service: Cardiovascular: PCI dCx = thrombectomy -> 3.0 x 15 Xience drug-eluting stent (3.6 mm)    COLONOSCOPY  2008   GUR:[id number] diverticula, diminutive polyp in the cecum, s/p bx Normal rectum. Tubular adenoma   COLONOSCOPY N/A 10/17/2012   Dr. Jena Gauss: colonic diverticulosis, tubular adenoma, surveillance due May 2019   COLONOSCOPY N/A 02/21/2018   Procedure: COLONOSCOPY;  Surgeon: Corbin Ade, MD; Diverticulosis of the sigmoid and descending colon, otherwise normal exam.  No recommendations to repeat.   CORONARY STENT PLACEMENT  11/23/2012   Mid RCA -- Xience eXp - 2.75 mm x 18 mm DES (3.29mm) , Dr. Herbie Baltimore   ESOPHAGOGASTRODUODENOSCOPY N/A 03/03/2021   Procedure: ESOPHAGOGASTRODUODENOSCOPY (EGD);  Surgeon: Corbin Ade, MD;  Location: [facility] ENDO  SU[facility];  Service: Endoscopy;  Laterality: N/A;  12:00pm   IR IVC FILTER PLMT / S&I /IMG GUID/MOD SED  05/27/2019   IR RADIOLOGIST EVAL & MGMT  09/11/2019   LEFT HEART CATHETERIZATION WITH CORONARY ANGIOGRAM N/A 11/23/2012   Procedure: LEFT HEART CATHETERIZATION WITH CORONARY ANGIOGRAM;  Surgeon: Marykay Lex, MD;  Location: [facility] CATH LAB;  Service: Cardiovascular: NSTEMI: Culprit = mid RCA 95% & 75%; LAD ~40-50%, Small RI ~60%; EF ~60%, mild inf-basal HK      MALONEY DILATION N/A 03/03/2021   Procedure: MALONEY DILATION;  Surgeon: Corbin Ade, MD;  Location: [facility] SUITE;  Service: Endoscopy;  Laterality: N/A;   TRANSTHORACIC ECHOCARDIOGRAM  January 2017; March 2017   a. EF 35-40%. Entire inferior hypokinesis and akinesis of the basal and mid inferolateral and anterolateral /distal lateral walls;; b. EF 40 and 45%. Akinesis of basal-mid inferolateral wall. Hypokinesis of entire lateral wall.    Social History: Social History   Socioeconomic History   Marital status: Married    Spouse name: Not on file   Number of children: Not on file   Years of education: Not on file   Highest education level: Not on file  Occupational History   Not on file  Tobacco Use   Smoking status: Former    Current packs/day: 0.00    Average packs/day: 0.3 packs/day for 2.0 years (0.5 ttl pk-yrs)    Types: Cigarettes    Start date: 06/06/1961    Quit date: 06/07/1963    Years since quitting: 60.2   Smokeless tobacco: Never  Vaping Use   Vaping status: Never Used  Substance and Sexual Activity   Alcohol use: No    Alcohol/week: 0.0 standard drinks of alcohol    Comment: Occasionally drinks red wine   Drug use: No   Sexual activity: Not on file  Other Topics Concern   Not on file  Social History Narrative   Exercises 3 to 4 days a week at the fitness center for about a half an hour at a time.   "former smoker". Does not drink EtOH   Married.   Social Drivers of Corporate investment banker Strain:  Not on file  Food Insecurity: No Food Insecurity (03/25/2023)   Hunger Vital  Sign    Worried About Programme researcher, broadcasting/film/video in the Last Year: Never true    Ran Out of Food in the Last Year: Never true  Transportation Needs: No Transportation Needs (03/25/2023)   PRAPARE - Administrator, Civil Service (Medical): No    Lack of Transportation (Non-Medical): No  Physical Activity: Not on file  Stress: Not on file  Social Connections: Not on file  Intimate Partner Violence: Not At Risk (03/25/2023)   Humiliation, Afraid, Rape, and Kick questionnaire    Fear of Current or Ex-Partner: No    Emotionally Abused: No    Physically Abused: No    Sexually Abused: No    Family History: Family History  Problem Relation Age of Onset   Diabetes Mother    Cancer Father        prostate   Colon cancer Neg Hx    Esophageal cancer Neg Hx    Stomach cancer Neg Hx     Current Medications:  Current Outpatient Medications:    atorvastatin (LIPITOR) 40 MG tablet, Take 1 tablet (40 mg total) by mouth daily., Disp: 90 tablet, Rfl: 1   azithromycin (ZITHROMAX) 500 MG tablet, Take 1 tablet (500 mg total) by mouth daily., Disp: 4 tablet, Rfl: 0   cefdinir (OMNICEF) 300 MG capsule, Take 1 capsule (300 mg total) by mouth every 12 (twelve) hours., Disp: 8 capsule, Rfl: 0   clopidogrel (PLAVIX) 75 MG tablet, TAKE 1 TABLET BY MOUTH EVERY DAY (Patient taking differently: Take 75 mg by mouth daily.), Disp: 90 tablet, Rfl: 1   [employer] VITAMIN B12 1000 MCG TBCR, TAKE 1 TABLET BY MOUTH EVERY DAY, Disp: 60 tablet, Rfl: 3   donepezil (ARICEPT) 5 MG tablet, Take 1 tablet by mouth daily., Disp: , Rfl:    furosemide (LASIX) 20 MG tablet, Take 1 tablet (20 mg total) by mouth every Monday, Wednesday, and Friday., Disp: 90 tablet, Rfl: 3   ketoconazole (NIZORAL) 2 % cream, Apply 1 Application topically daily as needed for irritation., Disp: , Rfl:    lisinopril (ZESTRIL) 2.5 MG tablet, Take 1 tablet (2.5 mg total) by  mouth every Tuesday, Thursday, Saturday, and Sunday., Disp: 90 tablet, Rfl: 3   memantine (NAMENDA) 10 MG tablet, Take 10 mg by mouth 2 (two) times daily., Disp: , Rfl:    nitroGLYCERIN (NITROSTAT) 0.4 MG SL tablet, Place 0.4 mg under the tongue every 5 (five) minutes as needed for chest pain., Disp: , Rfl:    ondansetron (ZOFRAN-ODT) 4 MG disintegrating tablet, Take 1 tablet (4 mg total) by mouth every 8 (eight) hours as needed for nausea or vomiting., Disp: 15 tablet, Rfl: 0   pantoprazole (PROTONIX) 40 MG tablet, TAKE 1 TABLET BY MOUTH EVERY DAY, Disp: 90 tablet, Rfl: 2   Allergies: No Known Allergies  REVIEW OF SYSTEMS:   Review of Systems  Respiratory:  Positive for cough.   Gastrointestinal:  Positive for diarrhea.  Musculoskeletal:  Positive for gait problem.  Neurological:  Positive for dizziness, gait problem and light-headedness.  Psychiatric/Behavioral:  Positive for confusion.      VITALS:   Blood pressure 118/63, pulse 61, temperature 98.4 F (36.9 C), temperature source Oral, resp. rate 19, height 5\' 3"  (1.6 m), weight 164 lb 0.4 oz (74.4 kg), SpO2 99%.  Wt Readings from Last 3 Encounters:  08/24/23 164 lb 0.4 oz (74.4 kg)  05/17/23 163 lb (73.9 kg)  03/27/23 171 lb 1.2 oz (77.6 kg)    Body  mass index is 29.06 kg/m.  PHYSICAL EXAM:   Physical Exam Constitutional:      Appearance: Normal appearance.  Cardiovascular:     Rate and Rhythm: Normal rate and regular rhythm.  Pulmonary:     Effort: Pulmonary effort is normal.     Breath sounds: Normal breath sounds.  Abdominal:     General: Bowel sounds are normal.     Palpations: Abdomen is soft.  Musculoskeletal:        General: No swelling. Normal range of motion.  Neurological:     Mental Status: He is alert. Mental status is at baseline. He is disoriented.     LABS:      Latest Ref Rng & Units 08/18/2023   10:51 AM 03/28/2023    4:32 AM 03/27/2023    4:19 AM  CBC  WBC 4.0 - 10.5 K/uL 26.4  22.3   23.4   Hemoglobin 13.0 - 17.0 g/dL 72.5  36.6  44.0   Hematocrit 39.0 - 52.0 % 45.8  40.8  41.2   Platelets 150 - 400 K/uL 178  171  160       Latest Ref Rng & Units 08/18/2023   10:51 AM 03/28/2023    4:32 AM 03/27/2023    4:19 AM  CMP  Glucose 70 - 99 mg/dL 347  425  956   BUN 8 - 23 mg/dL 12  13  15    Creatinine 0.61 - 1.24 mg/dL 3.87  5.64  3.32   Sodium 135 - 145 mmol/L 139  136  135   Potassium 3.5 - 5.1 mmol/L 4.2  3.6  3.9   Chloride 98 - 111 mmol/L 105  109  105   CO2 22 - 32 mmol/L 24  22  24    Calcium 8.9 - 10.3 mg/dL 9.3  7.8  7.9   Total Protein 6.5 - 8.1 g/dL 6.8     Total Bilirubin 0.0 - 1.2 mg/dL 0.7     Alkaline Phos 38 - 126 U/L 84     AST 15 - 41 U/L 23     ALT 0 - 44 U/L 12        Lab Results  Component Value Date   CEA 1.2 03/21/2014   /  CEA  Date Value Ref Range Status  03/21/2014 1.2 0.0 - 5.0 ng/mL Final    Comment:    Performed at [facility]   No results found for: "PSA1" No results found for: "CAN199" No results found for: "CAN125"  Lab Results  Component Value Date   TOTALPROTELP 6.7 11/25/2008   TOTALPROTELP 6.9 11/25/2008   ALBUMINELP 62.5 11/25/2008   A1GS 3.8 11/25/2008   A2GS 9.1 11/25/2008   BETS 6.1 11/25/2008   BETA2SER 4.3 11/25/2008   GAMS 14.2 11/25/2008   MSPIKE NOT DETECTED 11/25/2008   SPEI  11/25/2008    (NOTE) The possibility of a faint restricted band(s) cannot be completely excluded in gamma region.   Quantitative Immunoglobulins and Immunofixation will be performed as reflex orders. Reviewed by Dallas Breeding, MD, PhD, FCAP (Electronic Signature  on File)   Lab Results  Component Value Date   TIBC 272 08/18/2023   TIBC 279 02/17/2023   TIBC 296 01/10/2022   FERRITIN 220 08/18/2023   FERRITIN 109 02/17/2023   FERRITIN 123 01/10/2022   IRONPCTSAT 28 08/18/2023   IRONPCTSAT 22 02/17/2023   IRONPCTSAT 26 01/10/2022   Lab Results  Component Value Date   LDH 149 08/18/2023  LDH 124 02/17/2023    LDH 126 08/15/2022     STUDIES:   No results found.

## 2023-08-25 ENCOUNTER — Ambulatory Visit: Payer: Medicare HMO | Admitting: Oncology

## 2023-08-27 DIAGNOSIS — I5022 Chronic systolic (congestive) heart failure: Secondary | ICD-10-CM | POA: Diagnosis not present

## 2023-08-27 DIAGNOSIS — C9110 Chronic lymphocytic leukemia of B-cell type not having achieved remission: Secondary | ICD-10-CM | POA: Diagnosis not present

## 2023-08-27 DIAGNOSIS — R6510 Systemic inflammatory response syndrome (SIRS) of non-infectious origin without acute organ dysfunction: Secondary | ICD-10-CM | POA: Diagnosis not present

## 2023-08-27 DIAGNOSIS — J181 Lobar pneumonia, unspecified organism: Secondary | ICD-10-CM | POA: Diagnosis not present

## 2023-08-29 DIAGNOSIS — I2510 Atherosclerotic heart disease of native coronary artery without angina pectoris: Secondary | ICD-10-CM | POA: Diagnosis not present

## 2023-08-29 DIAGNOSIS — R112 Nausea with vomiting, unspecified: Secondary | ICD-10-CM | POA: Diagnosis not present

## 2023-09-01 DIAGNOSIS — R112 Nausea with vomiting, unspecified: Secondary | ICD-10-CM | POA: Diagnosis not present

## 2023-09-12 DIAGNOSIS — I5042 Chronic combined systolic (congestive) and diastolic (congestive) heart failure: Secondary | ICD-10-CM | POA: Diagnosis not present

## 2023-09-12 DIAGNOSIS — N1831 Chronic kidney disease, stage 3a: Secondary | ICD-10-CM | POA: Diagnosis not present

## 2023-09-12 DIAGNOSIS — N2581 Secondary hyperparathyroidism of renal origin: Secondary | ICD-10-CM | POA: Diagnosis not present

## 2023-09-12 DIAGNOSIS — C9190 Lymphoid leukemia, unspecified not having achieved remission: Secondary | ICD-10-CM | POA: Diagnosis not present

## 2023-09-12 DIAGNOSIS — I129 Hypertensive chronic kidney disease with stage 1 through stage 4 chronic kidney disease, or unspecified chronic kidney disease: Secondary | ICD-10-CM | POA: Diagnosis not present

## 2023-09-18 DIAGNOSIS — C9190 Lymphoid leukemia, unspecified not having achieved remission: Secondary | ICD-10-CM | POA: Diagnosis not present

## 2023-09-18 DIAGNOSIS — I129 Hypertensive chronic kidney disease with stage 1 through stage 4 chronic kidney disease, or unspecified chronic kidney disease: Secondary | ICD-10-CM | POA: Diagnosis not present

## 2023-09-18 DIAGNOSIS — I5042 Chronic combined systolic (congestive) and diastolic (congestive) heart failure: Secondary | ICD-10-CM | POA: Diagnosis not present

## 2023-09-18 DIAGNOSIS — N1831 Chronic kidney disease, stage 3a: Secondary | ICD-10-CM | POA: Diagnosis not present

## 2023-09-27 DIAGNOSIS — I5022 Chronic systolic (congestive) heart failure: Secondary | ICD-10-CM | POA: Diagnosis not present

## 2023-09-27 DIAGNOSIS — J181 Lobar pneumonia, unspecified organism: Secondary | ICD-10-CM | POA: Diagnosis not present

## 2023-09-27 DIAGNOSIS — R6510 Systemic inflammatory response syndrome (SIRS) of non-infectious origin without acute organ dysfunction: Secondary | ICD-10-CM | POA: Diagnosis not present

## 2023-09-27 DIAGNOSIS — C9110 Chronic lymphocytic leukemia of B-cell type not having achieved remission: Secondary | ICD-10-CM | POA: Diagnosis not present

## 2023-10-04 ENCOUNTER — Other Ambulatory Visit: Payer: Self-pay | Admitting: Gastroenterology

## 2023-10-05 NOTE — Telephone Encounter (Signed)
 Patient previously reported he would get refills from PCP. Will defer refill to PCP at this time.

## 2023-10-17 DIAGNOSIS — I10 Essential (primary) hypertension: Secondary | ICD-10-CM | POA: Diagnosis not present

## 2023-10-17 DIAGNOSIS — C9110 Chronic lymphocytic leukemia of B-cell type not having achieved remission: Secondary | ICD-10-CM | POA: Diagnosis not present

## 2023-10-17 DIAGNOSIS — W19XXXA Unspecified fall, initial encounter: Secondary | ICD-10-CM | POA: Diagnosis not present

## 2023-10-17 DIAGNOSIS — F0390 Unspecified dementia without behavioral disturbance: Secondary | ICD-10-CM | POA: Diagnosis not present

## 2023-10-17 DIAGNOSIS — N1832 Chronic kidney disease, stage 3b: Secondary | ICD-10-CM | POA: Diagnosis not present

## 2023-10-17 DIAGNOSIS — R531 Weakness: Secondary | ICD-10-CM | POA: Diagnosis not present

## 2023-10-17 DIAGNOSIS — R051 Acute cough: Secondary | ICD-10-CM | POA: Diagnosis not present

## 2023-10-17 DIAGNOSIS — R112 Nausea with vomiting, unspecified: Secondary | ICD-10-CM | POA: Diagnosis not present

## 2023-10-17 DIAGNOSIS — J302 Other seasonal allergic rhinitis: Secondary | ICD-10-CM | POA: Diagnosis not present

## 2023-10-23 DIAGNOSIS — F0390 Unspecified dementia without behavioral disturbance: Secondary | ICD-10-CM | POA: Diagnosis not present

## 2023-10-23 DIAGNOSIS — N1832 Chronic kidney disease, stage 3b: Secondary | ICD-10-CM | POA: Diagnosis not present

## 2023-10-23 DIAGNOSIS — J302 Other seasonal allergic rhinitis: Secondary | ICD-10-CM | POA: Diagnosis not present

## 2023-10-23 DIAGNOSIS — R1310 Dysphagia, unspecified: Secondary | ICD-10-CM | POA: Diagnosis not present

## 2023-10-23 DIAGNOSIS — C9110 Chronic lymphocytic leukemia of B-cell type not having achieved remission: Secondary | ICD-10-CM | POA: Diagnosis not present

## 2023-10-23 DIAGNOSIS — D638 Anemia in other chronic diseases classified elsewhere: Secondary | ICD-10-CM | POA: Diagnosis not present

## 2023-10-23 DIAGNOSIS — R7303 Prediabetes: Secondary | ICD-10-CM | POA: Diagnosis not present

## 2023-10-23 DIAGNOSIS — I509 Heart failure, unspecified: Secondary | ICD-10-CM | POA: Diagnosis not present

## 2023-10-23 DIAGNOSIS — R451 Restlessness and agitation: Secondary | ICD-10-CM | POA: Diagnosis not present

## 2023-10-23 DIAGNOSIS — I82409 Acute embolism and thrombosis of unspecified deep veins of unspecified lower extremity: Secondary | ICD-10-CM | POA: Diagnosis not present

## 2023-10-23 DIAGNOSIS — I10 Essential (primary) hypertension: Secondary | ICD-10-CM | POA: Diagnosis not present

## 2023-10-23 DIAGNOSIS — E785 Hyperlipidemia, unspecified: Secondary | ICD-10-CM | POA: Diagnosis not present

## 2023-10-27 DIAGNOSIS — J181 Lobar pneumonia, unspecified organism: Secondary | ICD-10-CM | POA: Diagnosis not present

## 2023-10-27 DIAGNOSIS — C9110 Chronic lymphocytic leukemia of B-cell type not having achieved remission: Secondary | ICD-10-CM | POA: Diagnosis not present

## 2023-10-27 DIAGNOSIS — I5022 Chronic systolic (congestive) heart failure: Secondary | ICD-10-CM | POA: Diagnosis not present

## 2023-10-27 DIAGNOSIS — R6510 Systemic inflammatory response syndrome (SIRS) of non-infectious origin without acute organ dysfunction: Secondary | ICD-10-CM | POA: Diagnosis not present

## 2023-11-01 ENCOUNTER — Telehealth: Payer: Self-pay

## 2023-11-01 DIAGNOSIS — R413 Other amnesia: Secondary | ICD-10-CM

## 2023-11-01 DIAGNOSIS — F0390 Unspecified dementia without behavioral disturbance: Secondary | ICD-10-CM

## 2023-11-02 ENCOUNTER — Telehealth: Payer: Self-pay

## 2023-11-02 DIAGNOSIS — F03C11 Unspecified dementia, severe, with agitation: Secondary | ICD-10-CM | POA: Diagnosis not present

## 2023-11-02 NOTE — Progress Notes (Signed)
 Complex Care Management Note  Care Guide Note 11/02/2023 Name: SHARMARKE CICIO MRN: [id number] DOB: [birth year]  Jannie Menghini is a 78 y.o. year old male who sees Del Favia, Lethia Raveling, MD for primary care. I reached out to Royston Cornea A Bensen's spouse Nasir Bright by phone today to offer complex care management services.  Mr. Dorer spouse was given information about Complex Care Management services today including:   The Complex Care Management services include support from the care team which includes your Nurse Care Manager, Clinical Social Worker, or Pharmacist.  The Complex Care Management team is here to help remove barriers to the health concerns and goals most important to you. Complex Care Management services are voluntary, and the patient may decline or stop services at any time by request to their care team member.   Complex Care Management Consent Status: Patient agreed to services and verbal consent obtained. Patient's spouse Hemi Chacko agreed to services and verbal consent was obtained.   Follow up plan:  Telephone appointment with complex care management team member scheduled for:  11-07-23  Encounter Outcome:  Patient Scheduled  [facility] Guide  Direct Dial: [phone]  Fax [phone]

## 2023-11-05 NOTE — Progress Notes (Unsigned)
 Cardiology Office Note    Date:  11/07/2023  ID:  [name], DOB 1946/02/23, MRN [id number] Cardiologist: Randene Bustard, MD --> patient's wife has requested they follow-up in [facility]  History of Present Illness:    [name] is a 78 y.o. male with past medical history of CAD (s/p stenting to RCA in 2014, s/p STEMI in 2017 with DES to LCx), chronic HFimpEF (EF previously 35-40% in 2017 and normalized by repeat imaging), HTN, HLD, CLL and dementia who presents to the office today for 48-month follow-up.   He was last examined by Dr. Addie Holstein in 02/2023 and had recently been evaluated in the ED for a syncopal episode felt to be due to orthostasis. He denied any recurrent syncopal episodes at that time and was only using Lasix  as needed. Given his multiple stents, it had previously been recommended to continue Plavix  75 mg daily for maintenance therapy and if he had issues with falls or bleeding in the future, would switch from Plavix  to ASA. He had been on statin therapy and risks and benefits of this were reviewed and it was recommended to stop at that time given his demenita. Given issues with hypotension and near syncope, it was recommend that he take Lisinopril  on Tuesdays/Thursdays/Saturdays/Sundays and take Lasix  on Mondays/Wednesdays/Fridays.   In the interim, he was admitted to [facility] in 03/2023 for sepsis in the setting of pneumonia. Was treated with antibiotic therapy. He did have an AKI on admission but creatinine returned to baseline prior to discharge. It does not appear that any changes were made to his cardiac medications at that time.  In talking with the patient and his wife today, most history is provided by his wife given his dementia. She reports he has variable PO intake and sometimes does not consume hardly any food or water  during the day. Will consume Ensure sometimes. She is actually talking with someone today about SNF placement given his worsening dementia  as she is having difficulty caring for him at home. He does use a walker for ambulation. Denies any specific chest pain or dyspnea on exertion but is not overly active at baseline. No specific orthopnea or PND. Does experience intermittent lower extremity edema and continues to take Lasix  on MWF. BP is at 124/82 during today's visit but his wife reports he previously had some issues with orthostatic hypotension.  Studies Reviewed:   EKG: EKG is not ordered today.  Echocardiogram: 10/2021 IMPRESSIONS     1. Left ventricular ejection fraction, by estimation, is 55 to 60%. The  left ventricle has normal function. The left ventricle has no regional  wall motion abnormalities. Left ventricular diastolic parameters are  indeterminate.   2. Right ventricular systolic function is normal. The right ventricular  size is normal. Tricuspid regurgitation signal is inadequate for assessing  PA pressure.   3. The mitral valve is abnormal. Mild mitral valve regurgitation. No  evidence of mitral stenosis.   4. The aortic valve is tricuspid. There is mild calcification of the  aortic valve. There is mild thickening of the aortic valve. Aortic valve  regurgitation is not visualized. No aortic stenosis is present.   5. The inferior vena cava is normal in size with greater than 50%  respiratory variability, suggesting right atrial pressure of 3 mmHg.    Physical Exam:   VS:  BP 124/82   Pulse 63   Ht 5\' 3"  (1.6 m)   Wt 140 lb (63.5 kg)  SpO2 96%   BMI 24.80 kg/m    Wt Readings from Last 3 Encounters:  11/07/23 140 lb (63.5 kg)  08/24/23 164 lb 0.4 oz (74.4 kg)  05/17/23 163 lb (73.9 kg)     GEN: Pleasant, elderly male appearing in no acute distress NECK: No JVD; No carotid bruits CARDIAC: RRR, no murmurs, rubs, gallops RESPIRATORY:  Clear to auscultation without rales, wheezing or rhonchi  ABDOMEN: Appears non-distended. No obvious abdominal masses. EXTREMITIES: No clubbing or cyanosis.  Trace lower extremity edema bilaterally.  Distal pedal pulses are 2+ bilaterally.   Assessment and Plan:   1. Coronary artery disease involving native coronary artery of native heart without angina pectoris - He has a history of stenting to the RCA in 2014 and did have a STEMI in 2017 with DES to the LCx as discussed above. - While his activity is limited, he denies any specific anginal symptoms. He has remained on Plavix  75 mg daily for maintenance therapy given his history of multiple stents. No reports of active bleeding. Labs in 10/2023 showed his Hgb was at 13.8 with platelets at 184 K. WBC has been chronically elevated in the setting of CLL.  - Continue Atorvastatin  as discussed below. Not on a beta-blocker given borderline bradycardia and variable BP.   2. Heart failure with improved ejection fraction (HFimpEF) (HCC) - Echo in 10/2021 showed his EF was at 55-60%. Medical therapy has overall been limited given his orthostasis and prior syncope.  - Currently on Lisinopril  2.5 mg every Tuesday, Thursday, Saturday and Sunday and takes Lasix  on MWF. Would not further titrate given his variable PO intake and history of orthostasis. Creatinine was stable at 1.45 when checked in 08/2023 which is similar to prior values.  - We reviewed the role of compression stockings but his wife says he previously would not let her place these. Could consider using if an available option at SNF.  3. Essential hypertension - BP is at 124/82 during today's visit. He takes Lisinopril  on Tuesdays, Thursdays, Saturdays and Sundays as discussed above. Would not further titrate given reports of orthostatic hypotension and his variable PO intake.  4. Hyperlipidemia LDL goal <70 - LDL was at 69 when checked in 06/2023. He has remained on Atorvastatin  80mg  daily (previously reviewed he could discontinue this given dementia but has been continued by his PCP).   Signed, Dorma Gash, PA-C

## 2023-11-07 ENCOUNTER — Encounter: Payer: Self-pay | Admitting: Student

## 2023-11-07 ENCOUNTER — Ambulatory Visit: Attending: Student | Admitting: Student

## 2023-11-07 ENCOUNTER — Other Ambulatory Visit: Payer: Self-pay

## 2023-11-07 VITALS — BP 124/82 | HR 63 | Ht 63.0 in | Wt 140.0 lb

## 2023-11-07 DIAGNOSIS — I5032 Chronic diastolic (congestive) heart failure: Secondary | ICD-10-CM | POA: Diagnosis not present

## 2023-11-07 DIAGNOSIS — E785 Hyperlipidemia, unspecified: Secondary | ICD-10-CM

## 2023-11-07 DIAGNOSIS — I2510 Atherosclerotic heart disease of native coronary artery without angina pectoris: Secondary | ICD-10-CM

## 2023-11-07 DIAGNOSIS — I10 Essential (primary) hypertension: Secondary | ICD-10-CM | POA: Diagnosis not present

## 2023-11-07 DIAGNOSIS — I5020 Unspecified systolic (congestive) heart failure: Secondary | ICD-10-CM

## 2023-11-07 NOTE — Patient Outreach (Signed)
 Complex Care Management   Visit Note  11/07/2023  Name:  [name] MRN: [id number] DOB: January 21, 1946  Situation: Referral received for Complex Care Management related to Dementia. I obtained verbal consent from Caregiver.  Visit completed with caregiver-spouse  on the phone  Background:   Past Medical History:  Diagnosis Date   Acute MI, inferolateral wall, initial episode of care (HCC) 06/16/2015   99% dCx    Acute respiratory failure with hypoxia (HCC) 05/22/2019   BPH (benign prostatic hyperplasia) 04/08/2011   CAD S/P percutaneous coronary angioplasty 11/23/12; 06/16/15   a. NSTEMI 2014 with 2 overlapping mRCA lesions - Xience Xpedition DES 2.75 mm x 18 mm (3.0 mm). b. Inf-Lat STEMI 06/16/2015 - dCx Asp Thrombectomy & PCI 3.0 x 15 Xience drDES (3.6 mm).   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     Stable.  No melena, hematochezia or hematuria.     Ischemic cardiomyopathy    Echo 06/18/2015 EF 35-40% after lateral MI -> repeat echo  08/06/2015: EF 40-45% with basal-mid inferolateral akinesis and hypokinesis of the lateral wall.   Memory loss 07/09/2019   Near syncope 11/08/2022   NSTEMI (non-ST elevated myocardial infarction) (HCC) 11/23/2012   NSTEMI 11/23/2012 RCA lesion - PCI with DES; moderate LAD disease   PTSD (post-traumatic stress disorder)    With Depression - since MI   Recurrent coronary arteriosclerosis after percutaneous transluminal coronary angioplasty 11/24/2012   PCI of mid RCA -- Xience eXp 2.75 mm x 18 mm (post-dil to 3.1 mm) 2014  PCI of CFX with DES 06/16/15     Last Assessment & Plan:    DES stents in the RCA and LCx.  No recurrent angina symptoms.     Plan: Continue monotherapy with Plavix .   Okay to hold Plavix  5 days preop for surgeries or procedures.      Assessment: Patient Reported Symptoms:  Cognitive Cognitive Status: Confused or disoriented, Difficulties with attention and concentration, Requires Assistance Decision Making Cognitive/Intellectual Conditions Management [RPT]: Other (Dementia)      Neurological Neurological Review of Symptoms: Not assessed    HEENT HEENT Symptoms Reported: Not assessed      Cardiovascular Cardiovascular Symptoms Reported: Not assessed    Respiratory Respiratory Symptoms Reported: Not assesed    Endocrine Patient reports the following symptoms related to hypoglycemia or hyperglycemia : Not assessed    Gastrointestinal Gastrointestinal Symptoms Reported: Not assessed      Genitourinary Genitourinary Symptoms Reported: Not assessed  Integumentary Integumentary Symptoms Reported: Not assessed    Musculoskeletal Musculoskelatal Symptoms Reviewed: Not assessed        Psychosocial Psychosocial Symptoms Reported: Depression - if selected complete PHQ 2-9 Additional Psychological Details: Recently on Remeron for depression as reported by caregiver Behavioral Health Conditions: Dementia, Depression Behavioral Management Strategies: Support system  (supported by caregiver) Behavioral Health Self-Management Outcome: 3 (uncertain) Major Change/Loss/Stressor/Fears (CP): Medical condition, self Quality of Family Relationships: supportive Do you feel physically threatened by others?: No      11/07/2023    1:25 PM  Depression screen PHQ 2/9  Decreased Interest 2  Down, Depressed, Hopeless 2  PHQ - 2 Score 4  Altered sleeping 0  Tired, decreased energy 1  Change in appetite 1  Feeling bad or failure about yourself  1  Trouble concentrating 1  Moving slowly or fidgety/restless 1  Suicidal thoughts 0  PHQ-9 Score 9  Difficult doing work/chores Somewhat difficult    There were no vitals filed for this visit.  Medications Reviewed Today     Reviewed by Festus Hubert (Social Worker) on 11/07/23 at 1322  Med List Status: <None>   Medication Order Taking? Sig Documenting Provider Last Dose Status Informant  atorvastatin  (LIPITOR) 40 MG tablet [id number] Yes Take 1 tablet (40 mg total) by mouth daily. Zorita Hiss, NP Taking Active Spouse/Significant Other  cetirizine (ZYRTEC) 10 MG tablet [id number] Yes Take 1 tablet by mouth daily. [provider] Taking Active   clopidogrel  (PLAVIX ) 75 MG tablet [id number] Yes TAKE 1 TABLET BY MOUTH EVERY DAY  Patient taking differently: Take 75 mg by mouth daily.   Lenor Raddle, MD Taking Active Spouse/Significant Other  donepezil (ARICEPT) 5 MG tablet [id number] Yes Take 1 tablet by mouth daily. [provider] Taking Active   famotidine (PEPCID) 20 MG tablet [id number] Yes Take 1 tablet every day by oral route. [provider] Taking Active   furosemide  (LASIX ) 20 MG tablet [id number] Yes Take 1 tablet (20 mg total) by mouth every Monday, Wednesday, and Friday. Arleen Lacer, MD Taking Active Spouse/Significant Other  ketoconazole (NIZORAL) 2 % cream [id number] Yes Apply 1 Application topically daily as needed for irritation. [provider] Taking  Active Spouse/Significant Other  lisinopril  (ZESTRIL ) 2.5 MG tablet [id number] Yes Take 1 tablet (2.5 mg total) by mouth every Tuesday, Thursday, Saturday, and Sunday. Arleen Lacer, MD Taking Active Spouse/Significant Other  memantine (NAMENDA) 10 MG tablet [id number] Yes Take 10 mg by mouth 2 (two) times daily. [provider] Taking Active   mirtazapine (REMERON) 7.5 MG tablet [id number] Yes Take 1 tablet by mouth daily. [provider] Taking Active   nitroGLYCERIN  (NITROSTAT ) 0.4 MG SL tablet [id number] Yes Place 0.4 mg under the tongue every 5 (five) minutes as needed for chest pain. [provider] Taking Active Spouse/Significant Other           Med Note Shann Darnel, Jenine Mix A   Thu Feb 25, 2021  9:59 AM) Newly prescribed, hasnt picked up yet  ondansetron  (ZOFRAN -ODT) 4 MG disintegrating tablet [id number] No Take 1 tablet (4 mg total) by mouth every 8 (eight) hours as needed for nausea or vomiting.  Patient not taking: Reported on 11/07/2023   Corbin Dess, PA-C Not Taking Active Spouse/Significant Other  pantoprazole  (PROTONIX ) 40 MG tablet [id number] No TAKE 1 TABLET BY MOUTH EVERY DAY  Patient not taking: Reported on 11/07/2023   Carlan, Chelsea L, NP Not Taking Active Spouse/Significant Other  Recommendation:   PCP Follow-up  Follow Up Plan:   Telephone follow-up 11/17/2023 at 10:00 am  Georgine Kitchens, MSW, [employer] [place]  [facility], [facility] Licensed Clinical Social Worker Direct Dial: [phone]

## 2023-11-07 NOTE — Patient Instructions (Signed)
 Medication Instructions:  Your physician recommends that you continue on your current medications as directed. Please refer to the Current Medication list given to you today.  *If you need a refill on your cardiac medications before your next appointment, please call your pharmacy*  Lab Work: None If you have labs (blood work) drawn today and your tests are completely normal, you will receive your results only by: MyChart Message (if you have MyChart) OR A paper copy in the mail If you have any lab test that is abnormal or we need to change your treatment, we will call you to review the results.  Testing/Procedures: None  Follow-Up: At [facility], you and your health needs are our priority.  As part of our continuing mission to provide you with exceptional heart care, our providers are all part of one team.  This team includes your primary Cardiologist (physician) and Advanced Practice Providers or APPs (Physician Assistants and Nurse Practitioners) who all work together to provide you with the care you need, when you need it.  Your next appointment:   6 month(s)  Provider:   You may see Vishnu P Mallipeddi, MD or one of the following Advanced Practice Providers on your designated Care Team:   [place], PA-C  [place], [place] Jacolyn Reedy, [place]     We recommend signing up for the patient portal called "MyChart".  Sign up information is provided on this After Visit Summary.  MyChart is used to connect with patients for Virtual Visits (Telemedicine).  Patients are able to view lab/test results, encounter notes, upcoming appointments, etc.  Non-urgent messages can be sent to your provider as well.   To learn more about what you can do with MyChart, go to [email or web address].   Other Instructions

## 2023-11-07 NOTE — Patient Instructions (Signed)
 Visit Information  Thank you for taking time to visit with me today. Please don't hesitate to contact me if I can be of assistance to you before our next scheduled appointment.  Our next appointment is by telephone on 11/17/2023 at 10:00 am Please call the care guide team at [phone] if you need to cancel or reschedule your appointment.   Following is a copy of your care plan:   Goals Addressed             This Visit's Progress    VBCI Social Work Care Plan: LCSW       Problems:   Care Coordination needs related to Dementia: Dementia with mood disturbances  CSW Clinical Goal(s):   Over the next 90 days the Caregiver will work with Child psychotherapist to address concerns related to dementia and placement options..  Interventions:  Dementia Care:   Current level of care: Home with other family or significant other(s): spouse  Evaluation of patient safety in current living environment and review of Dementia resources and support (in regards to memory care facilities) Facility  Discussed payment options for placement :  Emotional Support Provided Motivational Interviewing employed Reviewed facility placement process : Spouse wanted more information about [facility] in [place].              Active listening / Reflection utilized      Caregiver stress acknowledged       Consideration on in-home help encouraged : options discussed, Caregiver reports that patient will not allow others to care for him.      Depression screen reviewed      Emotional Support Provided     PHQ2/PHQ9 completed  Patient Goals/Self-Care Activities:  PCP follow up.  Plan:   Telephone follow up appointment with care management team member scheduled for:  11/17/2023 at 10:00 am        Please call the Suicide and Crisis Lifeline: 988 if you are experiencing a Mental Health or Behavioral Health Crisis or need someone to talk to.  Patient verbalizes understanding of instructions and care plan provided  today and agrees to view in MyChart. Active MyChart status and patient understanding of how to access instructions and care plan via MyChart confirmed with patient.     Georgine Kitchens, MSW, LCSW [place]  [facility], [facility] Licensed Clinical Social Worker Direct Dial: [phone]

## 2023-11-10 ENCOUNTER — Other Ambulatory Visit (HOSPITAL_COMMUNITY): Payer: Self-pay | Admitting: Family Medicine

## 2023-11-10 ENCOUNTER — Ambulatory Visit (HOSPITAL_COMMUNITY)
Admission: RE | Admit: 2023-11-10 | Discharge: 2023-11-10 | Disposition: A | Discharge status: Home / Self Care | Source: Ambulatory Visit | Attending: Family Medicine | Admitting: Family Medicine

## 2023-11-10 ENCOUNTER — Encounter (HOSPITAL_COMMUNITY): Payer: Self-pay | Admitting: Family Medicine

## 2023-11-10 DIAGNOSIS — F0390 Unspecified dementia without behavioral disturbance: Secondary | ICD-10-CM | POA: Diagnosis not present

## 2023-11-10 DIAGNOSIS — R051 Acute cough: Secondary | ICD-10-CM | POA: Insufficient documentation

## 2023-11-10 DIAGNOSIS — E861 Hypovolemia: Secondary | ICD-10-CM | POA: Diagnosis not present

## 2023-11-10 DIAGNOSIS — C9110 Chronic lymphocytic leukemia of B-cell type not having achieved remission: Secondary | ICD-10-CM | POA: Diagnosis not present

## 2023-11-10 DIAGNOSIS — F03C11 Unspecified dementia, severe, with agitation: Secondary | ICD-10-CM | POA: Diagnosis not present

## 2023-11-10 DIAGNOSIS — R0989 Other specified symptoms and signs involving the circulatory and respiratory systems: Secondary | ICD-10-CM | POA: Diagnosis not present

## 2023-11-10 DIAGNOSIS — I509 Heart failure, unspecified: Secondary | ICD-10-CM | POA: Diagnosis not present

## 2023-11-10 DIAGNOSIS — E785 Hyperlipidemia, unspecified: Secondary | ICD-10-CM | POA: Diagnosis not present

## 2023-11-10 DIAGNOSIS — N1832 Chronic kidney disease, stage 3b: Secondary | ICD-10-CM | POA: Diagnosis not present

## 2023-11-13 DIAGNOSIS — F03C11 Unspecified dementia, severe, with agitation: Secondary | ICD-10-CM | POA: Diagnosis not present

## 2023-11-14 ENCOUNTER — Emergency Department (HOSPITAL_COMMUNITY)
Admission: EM | Admit: 2023-11-14 | Discharge: 2023-11-20 | Disposition: A | Discharge status: Skilled Nursing Facility | Attending: Emergency Medicine | Admitting: Emergency Medicine

## 2023-11-14 ENCOUNTER — Encounter (HOSPITAL_COMMUNITY): Payer: Self-pay | Admitting: Emergency Medicine

## 2023-11-14 ENCOUNTER — Other Ambulatory Visit: Payer: Self-pay

## 2023-11-14 ENCOUNTER — Emergency Department (HOSPITAL_COMMUNITY)

## 2023-11-14 DIAGNOSIS — I2510 Atherosclerotic heart disease of native coronary artery without angina pectoris: Secondary | ICD-10-CM | POA: Diagnosis not present

## 2023-11-14 DIAGNOSIS — Z856 Personal history of leukemia: Secondary | ICD-10-CM | POA: Insufficient documentation

## 2023-11-14 DIAGNOSIS — Z79899 Other long term (current) drug therapy: Secondary | ICD-10-CM | POA: Insufficient documentation

## 2023-11-14 DIAGNOSIS — D72829 Elevated white blood cell count, unspecified: Secondary | ICD-10-CM | POA: Diagnosis not present

## 2023-11-14 DIAGNOSIS — K5730 Diverticulosis of large intestine without perforation or abscess without bleeding: Secondary | ICD-10-CM | POA: Diagnosis not present

## 2023-11-14 DIAGNOSIS — N400 Enlarged prostate without lower urinary tract symptoms: Secondary | ICD-10-CM | POA: Diagnosis not present

## 2023-11-14 DIAGNOSIS — N1832 Chronic kidney disease, stage 3b: Secondary | ICD-10-CM | POA: Insufficient documentation

## 2023-11-14 DIAGNOSIS — I5022 Chronic systolic (congestive) heart failure: Secondary | ICD-10-CM | POA: Diagnosis not present

## 2023-11-14 DIAGNOSIS — I509 Heart failure, unspecified: Secondary | ICD-10-CM | POA: Diagnosis not present

## 2023-11-14 DIAGNOSIS — I959 Hypotension, unspecified: Secondary | ICD-10-CM | POA: Diagnosis not present

## 2023-11-14 DIAGNOSIS — R0989 Other specified symptoms and signs involving the circulatory and respiratory systems: Secondary | ICD-10-CM | POA: Diagnosis not present

## 2023-11-14 DIAGNOSIS — F0390 Unspecified dementia without behavioral disturbance: Secondary | ICD-10-CM | POA: Diagnosis not present

## 2023-11-14 DIAGNOSIS — I6782 Cerebral ischemia: Secondary | ICD-10-CM | POA: Diagnosis not present

## 2023-11-14 DIAGNOSIS — F03C11 Unspecified dementia, severe, with agitation: Secondary | ICD-10-CM | POA: Diagnosis not present

## 2023-11-14 DIAGNOSIS — Z043 Encounter for examination and observation following other accident: Secondary | ICD-10-CM | POA: Diagnosis not present

## 2023-11-14 DIAGNOSIS — Z7902 Long term (current) use of antithrombotics/antiplatelets: Secondary | ICD-10-CM | POA: Diagnosis not present

## 2023-11-14 DIAGNOSIS — E785 Hyperlipidemia, unspecified: Secondary | ICD-10-CM | POA: Diagnosis not present

## 2023-11-14 DIAGNOSIS — R2681 Unsteadiness on feet: Secondary | ICD-10-CM | POA: Diagnosis not present

## 2023-11-14 DIAGNOSIS — I700 Atherosclerosis of aorta: Secondary | ICD-10-CM | POA: Diagnosis not present

## 2023-11-14 DIAGNOSIS — I10 Essential (primary) hypertension: Secondary | ICD-10-CM | POA: Diagnosis not present

## 2023-11-14 DIAGNOSIS — R531 Weakness: Secondary | ICD-10-CM | POA: Diagnosis not present

## 2023-11-14 DIAGNOSIS — K8020 Calculus of gallbladder without cholecystitis without obstruction: Secondary | ICD-10-CM | POA: Diagnosis not present

## 2023-11-14 DIAGNOSIS — I130 Hypertensive heart and chronic kidney disease with heart failure and stage 1 through stage 4 chronic kidney disease, or unspecified chronic kidney disease: Secondary | ICD-10-CM | POA: Diagnosis not present

## 2023-11-14 DIAGNOSIS — R410 Disorientation, unspecified: Secondary | ICD-10-CM | POA: Diagnosis not present

## 2023-11-14 DIAGNOSIS — G319 Degenerative disease of nervous system, unspecified: Secondary | ICD-10-CM | POA: Diagnosis not present

## 2023-11-14 LAB — URINALYSIS, ROUTINE W REFLEX MICROSCOPIC
Bacteria, UA: NONE SEEN
Bilirubin Urine: NEGATIVE
Glucose, UA: NEGATIVE mg/dL
Ketones, ur: NEGATIVE mg/dL
Leukocytes,Ua: NEGATIVE
Nitrite: NEGATIVE
Protein, ur: 30 mg/dL — AB
RBC / HPF: >50 RBC/hpf (ref 0–5)
Specific Gravity, Urine: 1.019 (ref 1.005–1.030)
pH: 7 (ref 5.0–8.0)

## 2023-11-14 LAB — RAPID URINE DRUG SCREEN, HOSP PERFORMED
Amphetamines: NOT DETECTED
Barbiturates: NOT DETECTED
Benzodiazepines: NOT DETECTED
Cocaine: NOT DETECTED
Opiates: NOT DETECTED
Tetrahydrocannabinol: NOT DETECTED

## 2023-11-14 LAB — CBC WITH DIFFERENTIAL/PLATELET
Abs Immature Granulocytes: 0 10*3/uL (ref 0.00–0.07)
Basophils Absolute: 0 10*3/uL (ref 0.0–0.1)
Basophils Relative: 0 %
Eosinophils Absolute: 0.5 10*3/uL (ref 0.0–0.5)
Eosinophils Relative: 2 %
HCT: 39.9 % (ref 39.0–52.0)
Hemoglobin: 12.4 g/dL — ABNORMAL LOW (ref 13.0–17.0)
Lymphocytes Relative: 68 %
Lymphs Abs: 16.7 10*3/uL — ABNORMAL HIGH (ref 0.7–4.0)
MCH: 31.2 pg (ref 26.0–34.0)
MCHC: 31.1 g/dL (ref 30.0–36.0)
MCV: 100.5 fL — ABNORMAL HIGH (ref 80.0–100.0)
Monocytes Absolute: 0.7 10*3/uL (ref 0.1–1.0)
Monocytes Relative: 3 %
Neutro Abs: 6.6 10*3/uL (ref 1.7–7.7)
Neutrophils Relative %: 27 %
Platelets: 136 10*3/uL — ABNORMAL LOW (ref 150–400)
RBC: 3.97 MIL/uL — ABNORMAL LOW (ref 4.22–5.81)
RDW: 14.6 % (ref 11.5–15.5)
WBC: 24.6 10*3/uL — ABNORMAL HIGH (ref 4.0–10.5)
nRBC: 0 % (ref 0.0–0.2)

## 2023-11-14 LAB — COMPREHENSIVE METABOLIC PANEL WITH GFR
ALT: 11 U/L (ref 0–44)
AST: 18 U/L (ref 15–41)
Albumin: 3.5 g/dL (ref 3.5–5.0)
Alkaline Phosphatase: 81 U/L (ref 38–126)
Anion gap: 10 (ref 5–15)
BUN: 28 mg/dL — ABNORMAL HIGH (ref 8–23)
CO2: 23 mmol/L (ref 22–32)
Calcium: 9 mg/dL (ref 8.9–10.3)
Chloride: 105 mmol/L (ref 98–111)
Creatinine, Ser: 1.5 mg/dL — ABNORMAL HIGH (ref 0.61–1.24)
GFR, Estimated: 47 mL/min — ABNORMAL LOW (ref >60)
Glucose, Bld: 134 mg/dL — ABNORMAL HIGH (ref 70–99)
Potassium: 4.2 mmol/L (ref 3.5–5.1)
Sodium: 138 mmol/L (ref 135–145)
Total Bilirubin: 1.5 mg/dL — ABNORMAL HIGH (ref 0.0–1.2)
Total Protein: 6.4 g/dL — ABNORMAL LOW (ref 6.5–8.1)

## 2023-11-14 LAB — ETHANOL: Alcohol, Ethyl (B): <15 mg/dL (ref <15)

## 2023-11-14 LAB — AMMONIA: Ammonia: 21 umol/L (ref 9–35)

## 2023-11-14 NOTE — ED Triage Notes (Signed)
 Pt c/o increased weakness, more confused and combative at times. Wife states he is not eating well and falling. She cannot take care of him at home anymore.

## 2023-11-14 NOTE — ED Notes (Signed)
 Pt resting comfortably. Pt wife reports she will be staying overnight. Pt wife provided a recliner chair, pillow, and warm blanket.

## 2023-11-14 NOTE — ED Notes (Signed)
 Pt in CT.

## 2023-11-14 NOTE — ED Provider Notes (Signed)
 [place] EMERGENCY DEPARTMENT AT [facility] Provider Note   CSN: [id number] Arrival date & time: 11/14/23  1647     History {Add pertinent medical, surgical, social history, OB history to HPI:1} Chief Complaint  Patient presents with   Weakness    [name] is a 78 y.o. male.  78 year old male with a history of CAD status post PCI, CHF, DVT sp IVC filter, hypertension, hyperlipidemia, CLL and dementia who presents to the emergency increasing weakness history obtained per EMS.  They state that his wife says that he was also not eating and falling.  She feels that she cannot take care of him at home.       Home Medications Prior to Admission medications   Medication Sig Start Date End Date Taking? Authorizing Provider  atorvastatin  (LIPITOR) 40 MG tablet Take 1 tablet (40 mg total) by mouth daily. 11/19/20 01/24/24  Gray, Sarah E, NP  cetirizine (ZYRTEC) 10 MG tablet Take 1 tablet by mouth daily.    [provider]  clopidogrel  (PLAVIX ) 75 MG tablet TAKE 1 TABLET BY MOUTH EVERY DAY Patient taking differently: Take 75 mg by mouth daily. 11/05/20   Gosrani, Nimish C, MD  donepezil (ARICEPT) 5 MG tablet Take 1 tablet by mouth daily.    [provider]  famotidine (PEPCID) 20 MG tablet Take 1 tablet every day by oral route.    [provider]  furosemide  (LASIX ) 20 MG tablet Take 1 tablet (20 mg total) by mouth every Monday, Wednesday, and Friday. 02/22/23   Arleen Lacer, MD  ketoconazole (NIZORAL) 2 % cream Apply 1 Application topically daily as needed for irritation. 01/09/23   [provider]  lisinopril  (ZESTRIL ) 2.5 MG tablet Take 1 tablet (2.5 mg total) by mouth every Tuesday, Thursday, Saturday, and Sunday. 02/23/23   Arleen Lacer, MD  memantine (NAMENDA) 10 MG tablet Take 10 mg by mouth 2 (two) times daily. 08/04/23   [provider]  mirtazapine (REMERON) 7.5 MG tablet Take 1 tablet by mouth daily.    [provider]  nitroGLYCERIN  (NITROSTAT ) 0.4 MG SL tablet Place 0.4 mg under the tongue every 5 (five) minutes as needed for chest pain.    [provider]  ondansetron  (ZOFRAN -ODT) 4 MG disintegrating tablet Take 1 tablet (4 mg total) by mouth every 8 (eight) hours as needed for nausea or vomiting. Patient not taking: Reported on 11/07/2023 03/12/22   Corbin Dess, PA-C  pantoprazole  (PROTONIX ) 40 MG tablet TAKE 1 TABLET BY MOUTH EVERY DAY Patient not taking: Reported on 11/07/2023 05/18/22   Carlan, Chelsea L, NP  risperiDONE (RISPERDAL) 0.25 MG tablet Take 0.25 mg by mouth 2 (two) times daily. 11/12/23   [provider]      Allergies    Patient has no known allergies.    Review of Systems   Review of Systems  Physical Exam Updated Vital Signs BP 131/64   Pulse 80   Temp 98.7 F (37.1 C) (Oral)   Resp 16   Ht 5\' 3"  (1.6 m)   Wt 63 kg   SpO2 94%   BMI 24.60 kg/m  Physical Exam Vitals and nursing note reviewed.  Constitutional:      General: He is not in acute distress.    Appearance: He is well-developed.     Comments: AAO to self only. Appears to be at baseline.   HENT:     Head: Normocephalic and atraumatic.  Right Ear: External ear normal.     Left Ear: External ear normal.     Nose: Nose normal.  Eyes:     Extraocular Movements: Extraocular movements intact.     Conjunctiva/sclera: Conjunctivae normal.     Pupils: Pupils are equal, round, and reactive to light.  Cardiovascular:     Rate and Rhythm: Normal rate and regular rhythm.     Heart sounds: Normal heart sounds.  Pulmonary:     Effort: Pulmonary effort is normal. No respiratory distress.     Breath sounds: Normal breath sounds.  Musculoskeletal:     Cervical back: Normal range of motion and neck supple.     Right lower leg: Edema present.     Left lower leg: Edema present.  Skin:    General: Skin is warm and dry.  Neurological:     Mental Status: He is alert. Mental status is  at baseline.     Cranial Nerves: No cranial nerve deficit.     Sensory: No sensory deficit.     Motor: No weakness.  Psychiatric:        Mood and Affect: Mood normal.        Behavior: Behavior normal.     ED Results / Procedures / Treatments   Labs (all labs ordered are listed, but only abnormal results are displayed) Labs Reviewed  COMPREHENSIVE METABOLIC PANEL WITH GFR  CBC WITH DIFFERENTIAL/PLATELET  URINALYSIS, ROUTINE W REFLEX MICROSCOPIC  AMMONIA  RAPID URINE DRUG SCREEN, HOSP PERFORMED  ETHANOL  CBG MONITORING, ED    EKG None  Radiology No results found.  Procedures Procedures  {Document cardiac monitor, telemetry assessment procedure when appropriate:1}  Medications Ordered in ED Medications - No data to display  ED Course/ Medical Decision Making/ A&P   {   Click here for ABCD2, HEART and other calculatorsREFRESH Note before signing :1}                              Medical Decision Making Amount and/or Complexity of Data Reviewed Labs: ordered. Radiology: ordered.   ***  {Document critical care time when appropriate:1} {Document review of labs and clinical decision tools ie heart score, Chads2Vasc2 etc:1}  {Document your independent review of radiology images, and any outside records:1} {Document your discussion with family members, caretakers, and with consultants:1} {Document social determinants of health affecting pt's care:1} {Document your decision making why or why not admission, treatments were needed:1} Final Clinical Impression(s) / ED Diagnoses Final diagnoses:  None    Rx / DC Orders ED Discharge Orders     None

## 2023-11-14 NOTE — ED Notes (Signed)
 Pt wife given ginger ale and crackers.

## 2023-11-15 MED ORDER — FAMOTIDINE 20 MG PO TABS
20.0000 mg | ORAL_TABLET | Freq: Two times a day (BID) | ORAL | Status: DC
  Administered 2023-11-15 – 2023-11-20 (×10): 20 mg via ORAL
  Filled 2023-11-15 (×10): qty 1

## 2023-11-15 MED ORDER — LISINOPRIL 5 MG PO TABS
2.5000 mg | ORAL_TABLET | ORAL | Status: DC
  Administered 2023-11-18 – 2023-11-19 (×2): 2.5 mg via ORAL
  Filled 2023-11-15 (×2): qty 1

## 2023-11-15 MED ORDER — FUROSEMIDE 40 MG PO TABS
20.0000 mg | ORAL_TABLET | ORAL | Status: DC
  Administered 2023-11-15 – 2023-11-20 (×3): 20 mg via ORAL
  Filled 2023-11-15 (×3): qty 1

## 2023-11-15 MED ORDER — MIRTAZAPINE 15 MG PO TABS
7.5000 mg | ORAL_TABLET | Freq: Every day | ORAL | Status: DC
  Administered 2023-11-15 – 2023-11-19 (×5): 7.5 mg via ORAL
  Filled 2023-11-15 (×5): qty 1

## 2023-11-15 MED ORDER — LORATADINE 10 MG PO TABS
10.0000 mg | ORAL_TABLET | Freq: Every day | ORAL | Status: DC
  Administered 2023-11-15 – 2023-11-20 (×5): 10 mg via ORAL
  Filled 2023-11-15 (×5): qty 1

## 2023-11-15 MED ORDER — RISPERIDONE 0.25 MG PO TABS
0.2500 mg | ORAL_TABLET | Freq: Two times a day (BID) | ORAL | Status: DC
  Administered 2023-11-15 – 2023-11-20 (×10): 0.25 mg via ORAL
  Filled 2023-11-15 (×10): qty 1

## 2023-11-15 MED ORDER — DONEPEZIL HCL 5 MG PO TABS
5.0000 mg | ORAL_TABLET | Freq: Every day | ORAL | Status: DC
  Administered 2023-11-15 – 2023-11-20 (×5): 5 mg via ORAL
  Filled 2023-11-15 (×5): qty 1

## 2023-11-15 MED ORDER — MIRTAZAPINE 15 MG PO TABS: 7.5000 mg | ORAL_TABLET | Freq: Every day | ORAL | Status: DC

## 2023-11-15 MED ORDER — CLOPIDOGREL BISULFATE 75 MG PO TABS
75.0000 mg | ORAL_TABLET | Freq: Every day | ORAL | Status: DC
  Administered 2023-11-15 – 2023-11-20 (×5): 75 mg via ORAL
  Filled 2023-11-15 (×5): qty 1

## 2023-11-15 MED ORDER — MEMANTINE HCL 10 MG PO TABS
10.0000 mg | ORAL_TABLET | Freq: Two times a day (BID) | ORAL | Status: DC
  Administered 2023-11-15 – 2023-11-20 (×10): 10 mg via ORAL
  Filled 2023-11-15 (×13): qty 1

## 2023-11-15 MED ORDER — ATORVASTATIN CALCIUM 40 MG PO TABS
40.0000 mg | ORAL_TABLET | Freq: Every day | ORAL | Status: DC
  Administered 2023-11-15 – 2023-11-20 (×5): 40 mg via ORAL
  Filled 2023-11-15 (×5): qty 1

## 2023-11-15 NOTE — Evaluation (Signed)
 Occupational Therapy Evaluation Patient Details Name: [name] MRN: [id number] DOB: 1945-12-19 Today's Date: 11/15/2023   History of Present Illness   [name] is a 78 y.o. male.     78 year old male with a history of CAD status post PCI, CHF, DVT sp IVC filter, hypertension, hyperlipidemia, CLL and dementia who presents to the emergency increasing weakness history obtained per EMS.  They state that his wife says that he was also not eating and falling.  She feels that she cannot take care of him at home. (per MD)     Clinical Impressions Pt agreeable to OT and PT co-evaluation. Pt has baseline dementia with wife present and reporting on current function. Pt assisted for nearly all ADL's at baseline. Pt could ambulate with RW until a week ago when pt became weaker  and began falling more. Wife reports that the increased demands is causing her back pain and overall difficulty. Pt required max A for mobility to the chair with RW. Difficult to assess B UE but generally weak with limited shoulder A/ROM. Much assist for ADL's at this time. Pt left in the chair with family present. Pt will benefit from continued OT in the hospital and recommended venue below to increase strength, balance, and endurance for safe ADL's.        If plan is discharge home, recommend the following:   A lot of help with walking and/or transfers;A lot of help with bathing/dressing/bathroom;Assistance with cooking/housework;Assistance with feeding;Assist for transportation;Help with stairs or ramp for entrance;Direct supervision/assist for medications management;Supervision due to cognitive status     Functional Status Assessment   Patient has had a recent decline in their functional status and demonstrates the ability to make significant improvements in function in a reasonable and predictable amount of time.     Equipment Recommendations   None recommended by OT              Precautions/Restrictions   Precautions Precautions: Fall Recall of Precautions/Restrictions: Impaired Restrictions Weight Bearing Restrictions Per Provider Order: No     Mobility Bed Mobility Overal bed mobility: Needs Assistance Bed Mobility: Supine to Sit     Supine to sit: Mod assist, Max assist, HOB elevated     General bed mobility comments: labored movement    Transfers Overall transfer level: Needs assistance Equipment used: Rolling walker (2 wheels) Transfers: Sit to/from Stand, Bed to chair/wheelchair/BSC Sit to Stand: Max assist, Mod assist     Step pivot transfers: Max assist     General transfer comment: labored; legs buckling      Balance Overall balance assessment: Needs assistance Sitting-balance support: No upper extremity supported, Feet supported Sitting balance-Leahy Scale: Fair Sitting balance - Comments: seated at EOB   Standing balance support: During functional activity, Bilateral upper extremity supported, Reliant on assistive device for balance Standing balance-Leahy Scale: Poor Standing balance comment: using RW                           ADL either performed or assessed with clinical judgement   ADL Overall ADL's : Needs assistance/impaired     Grooming: Maximal assistance;Sitting   Upper Body Bathing: Maximal assistance;Sitting   Lower Body Bathing: Maximal assistance;Sitting/lateral leans   Upper Body Dressing : Maximal assistance;Sitting   Lower Body Dressing: Maximal assistance;Total assistance;Sitting/lateral leans   Toilet Transfer: Maximal assistance;Stand-pivot;Rolling walker (2 wheels) Toilet Transfer Details (indicate cue type and reason): Simulated via EOB to chair with  RW Toileting- Architect and Hygiene: Total assistance;Maximal assistance;Bed level               Vision Baseline Vision/History: 1 Wears glasses Ability to See in Adequate Light: 1 Impaired Patient Visual Report: No  change from baseline Vision Assessment?: No apparent visual deficits     Perception Perception: Not tested       Praxis Praxis: Not tested       Pertinent Vitals/Pain Pain Assessment Pain Assessment: Faces Faces Pain Scale: Hurts little more Pain Location: B LE with movement Pain Descriptors / Indicators: Grimacing Pain Intervention(s): Limited activity within patient's tolerance, Monitored during session, Repositioned     Extremity/Trunk Assessment Upper Extremity Assessment Upper Extremity Assessment: Generalized weakness;Difficult to assess due to impaired cognition   Lower Extremity Assessment Lower Extremity Assessment: Defer to PT evaluation   Cervical / Trunk Assessment Cervical / Trunk Assessment: Kyphotic   Communication Communication Communication: No apparent difficulties   Cognition Arousal: Alert Behavior During Therapy: WFL for tasks assessed/performed Cognition: History of cognitive impairments             OT - Cognition Comments: Difficulty follow commands at times.                 Following commands: Impaired Following commands impaired: Follows one step commands inconsistently     Cueing  General Comments   Cueing Techniques: Verbal cues                 Home Living Family/patient expects to be discharged to:: Private residence Living Arrangements: Spouse/significant other Available Help at Discharge: Family;Available 24 hours/day Type of Home: House Home Access: Stairs to enter [employer] of Steps: 2 Entrance Stairs-Rails: Right Home Layout: Other (Comment);Two level;Able to live on main level with bedroom/bathroom     Bathroom Shower/Tub: Chief Strategy Officer: Standard     Home Equipment: Agricultural consultant (2 wheels);Hospital bed   Additional Comments: Pt's wife reported same living history as prior admission.      Prior Functioning/Environment Prior Level of Function : Needs assist        Physical Assist : Mobility (physical);ADLs (physical) Mobility (physical): Bed mobility;Transfers;Gait;Stairs ADLs (physical): IADLs;Dressing;Bathing;Grooming;Toileting Mobility Comments: household and short distanced community using RW; as of last week the pt had trouble with mobility. ADLs Comments: Assisted by family    OT Problem List: Decreased strength;Decreased range of motion;Decreased activity tolerance;Impaired balance (sitting and/or standing);Decreased cognition;Decreased safety awareness   OT Treatment/Interventions: Self-care/ADL training;Therapeutic exercise;Energy conservation;DME and/or AE instruction;Therapeutic activities;Patient/family education;Balance training;Cognitive remediation/compensation      OT Goals(Current goals can be found in the care plan section)   Acute Rehab OT Goals Patient Stated Goal: get more help OT Goal Formulation: With family Time For Goal Achievement: 11/29/23 Potential to Achieve Goals: Good   OT Frequency:  Min 2X/week    Co-evaluation PT/OT/SLP Co-Evaluation/Treatment: Yes Reason for Co-Treatment: To address functional/ADL transfers;Complexity of the patient's impairments (multi-system involvement)   OT goals addressed during session: ADL's and self-care      AM-PAC OT 6 Clicks Daily Activity     Outcome Measure Help from another person eating meals?: A Little Help from another person taking care of personal grooming?: A Lot Help from another person toileting, which includes using toliet, bedpan, or urinal?: Total Help from another person bathing (including washing, rinsing, drying)?: A Lot Help from another person to put on and taking off regular upper body clothing?: A Lot Help from another person to put on  and taking off regular lower body clothing?: Total 6 Click Score: 11   End of Session Equipment Utilized During Treatment: Rolling walker (2 wheels);Gait belt Nurse Communication: Mobility status  Activity  Tolerance: Patient tolerated treatment well Patient left: in chair;with call bell/phone within reach;with family/visitor present  OT Visit Diagnosis: Unsteadiness on feet (R26.81);Other abnormalities of gait and mobility (R26.89);Muscle weakness (generalized) (M62.81);History of falling (Z91.81);Other symptoms and signs involving cognitive function                Time: 0981-1914 OT Time Calculation (min): 16 min Charges:  OT General Charges $OT Visit: 1 Visit OT Evaluation $OT Eval Low Complexity: 1 Low  Michel Hendon OT, MOT   Thurnell Floss 11/15/2023, 9:50 AM

## 2023-11-15 NOTE — ED Notes (Signed)
 MD made aware of pts difficulty swallowing. Orders placed for swallowing eval.

## 2023-11-15 NOTE — TOC Initial Note (Signed)
 Transition of Care [facility]) - Initial/Assessment Note    Patient Details  Name: [name] MRN: [id number] Date of Birth: 06/12/1945  [facility]) CM/SW Contact:    Joslyn Nim, RN Phone Number: 11/15/2023, 2:20 PM  Clinical Narrative:                 CM spoke with wife, [name] via phone. She denies services in the home. She states that patient has access to a walker and wheelchair but will not use it. She states that she can not take care of him at home. Wife states she went to DSS office on Monday. His case worker is [employer], [phone]. Wife states that they had started the process for long term Medicaid. Wife states she has turned over his life insurance policy to the funeral home for his expense when the time arise. Wife is in agreement with CM calling DSS worker. Wife states that patient need a skill  and memory care unit where patient can receive therapy. She does not have any preference but wants to make sure it is a memory care unit.  CM called DSS worker MS. Peeler. She confirmed that long term Medicaid  application was submitted at the beginning of the week.    FL2 Completed  and referrals sent out  Expected Discharge Plan: Skilled Nursing Facility     Patient Goals and CMS Choice            Expected Discharge Plan and Services In-house Referral: NA   Post Acute Care Choice: NA Living arrangements for the past 2 months: Single Family Home                                      Prior Living Arrangements/Services Living arrangements for the past 2 months: Single Family Home Lives with:: Spouse Patient language and need for interpreter reviewed:: No        Need for Family Participation in Patient Care: No (Comment)   Current home services: DME    Activities of Daily Living      Permission Sought/Granted                  Emotional Assessment   Attitude/Demeanor/Rapport:  (per nurse pleasant)   Orientation: :  Oriented to Self      Admission diagnosis:  Failure to Thrive Patient Active Problem List   Diagnosis Date Noted   Lobar pneumonia (HCC) 03/27/2023   Sepsis due to undetermined organism (HCC) 03/27/2023   Chronic HFrEF (heart failure with reduced ejection fraction) (HCC) 03/26/2023   Acute renal failure superimposed on stage 3a chronic kidney disease (HCC) 03/26/2023   SIRS (systemic inflammatory response syndrome) (HCC) 03/26/2023   Bacteremia 03/26/2023   Personal history of CLL (chronic lymphocytic leukemia) 03/26/2023   Dementia without behavioral disturbance (HCC)    Hardening of the aorta (main artery of the heart) (HCC) 12/21/2022   Near syncope 11/08/2022   Impaired fasting glucose 02/17/2022   Atherosclerosis of coronary artery without angina pectoris 11/17/2021   Chronic kidney disease, stage 3b (HCC) 03/01/2021   Depressive disorder 03/01/2021   Congestive heart failure (HCC) 03/01/2021   Sundowning 03/01/2021   Coronary artery disease involving native coronary artery of native heart without angina pectoris 08/02/2020   Iron  deficiency anemia due to chronic blood loss 07/22/2019   Iron  deficiency anemia secondary to blood loss (chronic) 07/22/2019  Normocytic anemia 07/16/2019   Weakness 07/09/2019   Memory loss 07/09/2019   Rash 07/09/2019   Gastrointestinal hemorrhage associated with intestinal diverticulosis    Acute respiratory failure with hypoxia (HCC) 05/22/2019   Sepsis (HCC) 05/15/2019   Community acquired pneumonia 05/15/2019   Pneumonia due to COVID-19 virus 05/15/2019   COVID-19 05/15/2019   Abdominal pain 03/06/2018   Constipation 03/06/2018   Gastroesophageal reflux disease 03/08/2016   Dysphagia 03/08/2016   Chronic cough 03/08/2016   Ischemic cardiomyopathy    STEMI - 06/16/15 06/16/2015   Essential hypertension 06/16/2015   Myocardial infarction of inferolateral wall (HCC) 06/16/2015   Cholelithiasis without cholecystitis 12/18/2013    Hematochezia 06/04/2013   Lower leg edema 03/14/2013   PTSD (post-traumatic stress disorder)    CAD S/P PCI-- RCA DES 2014,  CFX DES Jan 2017 11/24/2012   Recurrent coronary arteriosclerosis after percutaneous transluminal coronary angioplasty 11/24/2012   h/o Non-STEMI (non-ST elevated myocardial infarction) (HCC) 11/23/2012   Hyperglycemia 11/23/2012   History of smoking 11/23/2012   Dyslipidemia, goal LDL below 70 - on atorvastatin ; monitored by PCP 11/23/2012   Diverticulitis of colon without hemorrhage- on antibiotics for recent flair up 09/19/2012   Hx of adenomatous colonic polyps 09/19/2012   CLL (chronic lymphocytic leukemia) (HCC) 04/08/2011   BPH (benign prostatic hyperplasia) 04/08/2011   Chronic lymphoid leukemia (HCC) 04/08/2011   PCP:  Omie Bickers, MD Pharmacy:   [facility] [phone] - [place], [place] - [address] AT [facility] [address] [place] [address] Phone: [phone] Fax: [phone]  [facility] - [place], [place] - [address] [place] [address] Phone: [phone] Fax: [phone]     Social Drivers of Health (SDOH) Social History: SDOH Screenings   Food Insecurity: No Food Insecurity (11/07/2023)  Housing: Low Risk  (11/07/2023)  Transportation Needs: No Transportation Needs (11/07/2023)  Utilities: Not At Risk (11/07/2023)  Depression (PHQ2-9): Medium Risk (11/07/2023)  Tobacco Use: Medium Risk (11/14/2023)   SDOH Interventions:     Readmission Risk Interventions     No data to display

## 2023-11-15 NOTE — NC FL2 (Signed)
 [place]  MEDICAID FL2 LEVEL OF CARE FORM     IDENTIFICATION  Patient Name: [name] Birthdate: [birth year] Sex: male Admission Date (Current Location): 11/14/2023  [facility] and [place] Number:  [employer] and Address:  [facility],  [address], [place] [address]      Provider Number: [id number]  Attending Physician Name and Address:  System, Provider Not In  Relative Name and Phone Number:  Ledell Codrington -wife [phone]    Current Level of Care: Hospital Recommended Level of Care: Skilled Nursing Facility, Memory Care Prior Approval Number:    Date Approved/Denied: 05/19/19 PASRR Number: [id number]  Discharge Plan: SNF    Current Diagnoses: Patient Active Problem List   Diagnosis Date Noted   Lobar pneumonia (HCC) 03/27/2023   Sepsis due to undetermined organism (HCC) 03/27/2023   Chronic HFrEF (heart failure with reduced ejection fraction) (HCC) 03/26/2023   Acute renal failure superimposed on stage 3a chronic kidney disease (HCC) 03/26/2023   SIRS (systemic inflammatory response syndrome) (HCC) 03/26/2023   Bacteremia 03/26/2023   Personal history of CLL (chronic lymphocytic leukemia) 03/26/2023   Dementia without behavioral disturbance (HCC)    Hardening of the aorta (main artery of the heart) (HCC) 12/21/2022   Near syncope 11/08/2022   Impaired fasting glucose 02/17/2022   Atherosclerosis of coronary artery without angina pectoris 11/17/2021   Chronic kidney disease, stage 3b (HCC) 03/01/2021   Depressive disorder 03/01/2021   Congestive heart failure (HCC) 03/01/2021   Sundowning 03/01/2021   Coronary artery disease involving native coronary artery of native heart without angina pectoris 08/02/2020   Iron  deficiency anemia due to chronic blood loss 07/22/2019   Iron  deficiency anemia secondary to blood loss (chronic) 07/22/2019   Normocytic anemia 07/16/2019   Weakness 07/09/2019   Memory loss 07/09/2019   Rash  07/09/2019   Gastrointestinal hemorrhage associated with intestinal diverticulosis    Acute respiratory failure with hypoxia (HCC) 05/22/2019   Sepsis (HCC) 05/15/2019   Community acquired pneumonia 05/15/2019   Pneumonia due to COVID-19 virus 05/15/2019   COVID-19 05/15/2019   Abdominal pain 03/06/2018   Constipation 03/06/2018   Gastroesophageal reflux disease 03/08/2016   Dysphagia 03/08/2016   Chronic cough 03/08/2016   Ischemic cardiomyopathy    STEMI - 06/16/15 06/16/2015   Essential hypertension 06/16/2015   Myocardial infarction of inferolateral wall (HCC) 06/16/2015   Cholelithiasis without cholecystitis 12/18/2013   Hematochezia 06/04/2013   Lower leg edema 03/14/2013   PTSD (post-traumatic stress disorder)    CAD S/P PCI-- RCA DES 2014,  CFX DES Jan 2017 11/24/2012   Recurrent coronary arteriosclerosis after percutaneous transluminal coronary angioplasty 11/24/2012   h/o Non-STEMI (non-ST elevated myocardial infarction) (HCC) 11/23/2012   Hyperglycemia 11/23/2012   History of smoking 11/23/2012   Dyslipidemia, goal LDL below 70 - on atorvastatin ; monitored by PCP 11/23/2012   Diverticulitis of colon without hemorrhage- on antibiotics for recent flair up 09/19/2012   Hx of adenomatous colonic polyps 09/19/2012   CLL (chronic lymphocytic leukemia) (HCC) 04/08/2011   BPH (benign prostatic hyperplasia) 04/08/2011   Chronic lymphoid leukemia (HCC) 04/08/2011    Orientation RESPIRATION BLADDER Height & Weight     Self  Normal Incontinent Weight: 63 kg Height:  5' 3 (160 cm)  BEHAVIORAL SYMPTOMS/MOOD NEUROLOGICAL BOWEL NUTRITION STATUS     (none) Continent Diet (Regular)  AMBULATORY STATUS COMMUNICATION OF NEEDS Skin   Extensive Assist Verbally Normal  Personal Care Assistance Level of Assistance  Bathing, Feeding, Dressing Bathing Assistance: Limited assistance Feeding assistance: Limited assistance Dressing Assistance: Limited  assistance     Functional Limitations Info  Sight, Hearing, Speech Sight Info: Adequate Hearing Info: Adequate Speech Info: Adequate    SPECIAL CARE FACTORS FREQUENCY  PT (By licensed PT), OT (By licensed OT)     PT Frequency: 5 days/ week OT Frequency: 5 days/week            Contractures Contractures Info: Not present    Additional Factors Info  Allergies   Allergies Info: No known allergies           Current Medications (11/15/2023):  This is the current hospital active medication list Current Facility-Administered Medications  Medication Dose Route Frequency Provider Last Rate Last Admin   atorvastatin  (LIPITOR) tablet 40 mg  40 mg Oral Daily Trifan, Matthew J, MD   40 mg at 11/15/23 1026   clopidogrel  (PLAVIX ) tablet 75 mg  75 mg Oral Daily Trifan, Matthew J, MD   75 mg at 11/15/23 1025   donepezil (ARICEPT) tablet 5 mg  5 mg Oral Daily Trifan, Matthew J, MD   5 mg at 11/15/23 1025   famotidine (PEPCID) tablet 20 mg  20 mg Oral BID Trifan, Matthew J, MD   20 mg at 11/15/23 1025   furosemide  (LASIX ) tablet 20 mg  20 mg Oral Q M,W,F Trifan, Matthew J, MD   20 mg at 11/15/23 1025   [START ON 11/16/2023] lisinopril  (ZESTRIL ) tablet 2.5 mg  2.5 mg Oral Q T,Th,S,Su Arvilla Birmingham, MD       loratadine (CLARITIN) tablet 10 mg  10 mg Oral Daily Trifan, Matthew J, MD   10 mg at 11/15/23 1025   memantine (NAMENDA) tablet 10 mg  10 mg Oral BID Trifan, Matthew J, MD   10 mg at 11/15/23 1025   mirtazapine (REMERON) tablet 7.5 mg  7.5 mg Oral QHS Green, Terri L, RPH       risperiDONE (RISPERDAL) tablet 0.25 mg  0.25 mg Oral BID Arvilla Birmingham, MD   0.25 mg at 11/15/23 1025   Current Outpatient Medications  Medication Sig Dispense Refill   atorvastatin  (LIPITOR) 40 MG tablet Take 1 tablet (40 mg total) by mouth daily. 90 tablet 1   cetirizine (ZYRTEC) 10 MG tablet Take 1 tablet by mouth daily.     clopidogrel  (PLAVIX ) 75 MG tablet TAKE 1 TABLET BY MOUTH EVERY DAY (Patient  taking differently: Take 75 mg by mouth daily.) 90 tablet 1   donepezil (ARICEPT) 5 MG tablet Take 1 tablet by mouth daily.     famotidine (PEPCID) 20 MG tablet Take 1 tablet every day by oral route.     furosemide  (LASIX ) 20 MG tablet Take 1 tablet (20 mg total) by mouth every Monday, Wednesday, and Friday. 90 tablet 3   lisinopril  (ZESTRIL ) 2.5 MG tablet Take 1 tablet (2.5 mg total) by mouth every Tuesday, Thursday, Saturday, and Sunday. 90 tablet 3   memantine (NAMENDA) 10 MG tablet Take 10 mg by mouth 2 (two) times daily.     mirtazapine (REMERON) 7.5 MG tablet Take 1 tablet by mouth daily.     nitroGLYCERIN  (NITROSTAT ) 0.4 MG SL tablet Place 0.4 mg under the tongue every 5 (five) minutes as needed for chest pain.     ondansetron  (ZOFRAN -ODT) 4 MG disintegrating tablet Take 1 tablet (4 mg total) by mouth every 8 (eight) hours as needed for nausea or  vomiting. 15 tablet 0   risperiDONE (RISPERDAL) 0.25 MG tablet Take 0.25 mg by mouth 2 (two) times daily.       Discharge Medications: Please see discharge summary for a list of discharge medications.  Relevant Imaging Results:  Relevant Lab Results:   Additional Information SSN [id number]  Joslyn Nim, RN

## 2023-11-15 NOTE — Evaluation (Signed)
 Physical Therapy Evaluation Patient Details Name: [name] MRN: [id number] DOB: 1945/08/18 Today's Date: 11/15/2023  History of Present Illness  MARQUEE FUCHS is a 78 y.o. male.     78 year old male with a history of CAD status post PCI, CHF, DVT sp IVC filter, hypertension, hyperlipidemia, CLL and dementia who presents to the emergency increasing weakness history obtained per EMS.  They state that his wife says that he was also not eating and falling.  She feels that she cannot take care of him at home. (per MD)   Clinical Impression  Patient demonstrates slow labored movement for sitting up at bedside, very unsteady on feet with frequent buckling of knees and limited to a few side steps before having to sit due to fall risk and BLE weakness. Patient tolerated sitting up in chair after therapy with spouse present. Patient will benefit from continued skilled physical therapy in hospital and recommended venue below to increase strength, balance, endurance for safe ADLs and gait.           If plan is discharge home, recommend the following: A lot of help with walking and/or transfers;A lot of help with bathing/dressing/bathroom;Assistance with cooking/housework;Assist for transportation;Help with stairs or ramp for entrance   Can travel by private vehicle   No    Equipment Recommendations None recommended by PT  Recommendations for Other Services       Functional Status Assessment Patient has had a recent decline in their functional status and demonstrates the ability to make significant improvements in function in a reasonable and predictable amount of time.     Precautions / Restrictions Precautions Precautions: Fall Recall of Precautions/Restrictions: Impaired Restrictions Weight Bearing Restrictions Per Provider Order: No      Mobility  Bed Mobility Overal bed mobility: Needs Assistance Bed Mobility: Supine to Sit     Supine to sit: Mod assist, Max assist, HOB  elevated     General bed mobility comments: increased time, labored movement    Transfers Overall transfer level: Needs assistance Equipment used: Rolling walker (2 wheels) Transfers: Sit to/from Stand, Bed to chair/wheelchair/BSC Sit to Stand: Max assist, Mod assist   Step pivot transfers: Max assist       General transfer comment: unsteady labored movement    Ambulation/Gait Ambulation/Gait assistance: Max assist Gait Distance (Feet): 3 Feet Assistive device: Rolling walker (2 wheels) Gait Pattern/deviations: Decreased step length - right, Decreased step length - left, Decreased stride length, Knees buckling, Shuffle, Trunk flexed Gait velocity: slow     General Gait Details: limited to a few shuffling side steps before having to sit due to legs giving way  Stairs            Wheelchair Mobility     Tilt Bed    Modified Rankin (Stroke Patients Only)       Balance Overall balance assessment: Needs assistance Sitting-balance support: Feet supported, No upper extremity supported Sitting balance-Leahy Scale: Fair Sitting balance - Comments: seated at EOB   Standing balance support: During functional activity, Bilateral upper extremity supported, Reliant on assistive device for balance Standing balance-Leahy Scale: Poor Standing balance comment: using RW                             Pertinent Vitals/Pain Pain Assessment Pain Assessment: Faces Faces Pain Scale: Hurts little more Pain Location: B LE with movement Pain Descriptors / Indicators: Grimacing Pain Intervention(s): Limited activity within patient's tolerance, Monitored  during session, Repositioned    Home Living Family/patient expects to be discharged to:: Private residence Living Arrangements: Spouse/significant other Available Help at Discharge: Family;Available 24 hours/day Type of Home: House Home Access: Stairs to enter Entrance Stairs-Rails: Right Entrance Stairs-Number of  Steps: 2   Home Layout: Other (Comment);Two level;Able to live on main level with bedroom/bathroom Home Equipment: Rolling Walker (2 wheels);Hospital bed Additional Comments: Pt's wife reported same living history as prior admission.    Prior Function Prior Level of Function : Needs assist       Physical Assist : Mobility (physical);ADLs (physical) Mobility (physical): Bed mobility;Transfers;Gait;Stairs ADLs (physical): IADLs;Dressing;Bathing;Grooming;Toileting Mobility Comments: household and short distanced community using RW; as of last week the pt had trouble with mobility. ADLs Comments: Assisted by family     Extremity/Trunk Assessment   Upper Extremity Assessment Upper Extremity Assessment: Defer to OT evaluation    Lower Extremity Assessment Lower Extremity Assessment: Generalized weakness    Cervical / Trunk Assessment Cervical / Trunk Assessment: Kyphotic  Communication   Communication Communication: No apparent difficulties    Cognition Arousal: Alert Behavior During Therapy: WFL for tasks assessed/performed                             Following commands: Intact Following commands impaired: Follows one step commands inconsistently     Cueing Cueing Techniques: Verbal cues     General Comments      Exercises     Assessment/Plan    PT Assessment Patient needs continued PT services  PT Problem List Decreased strength;Decreased activity tolerance;Decreased balance;Decreased mobility       PT Treatment Interventions DME instruction;Gait training;Stair training;Functional mobility training;Therapeutic activities;Therapeutic exercise;Balance training;Patient/family education    PT Goals (Current goals can be found in the Care Plan section)  Acute Rehab PT Goals Patient Stated Goal: return home PT Goal Formulation: With patient/family Time For Goal Achievement: 11/29/23 Potential to Achieve Goals: Good    Frequency Min 2X/week      Co-evaluation PT/OT/SLP Co-Evaluation/Treatment: Yes Reason for Co-Treatment: To address functional/ADL transfers PT goals addressed during session: Mobility/safety with mobility;Balance;Proper use of DME OT goals addressed during session: ADL's and self-care       AM-PAC PT 6 Clicks Mobility  Outcome Measure Help needed turning from your back to your side while in a flat bed without using bedrails?: A Lot Help needed moving from lying on your back to sitting on the side of a flat bed without using bedrails?: A Lot Help needed moving to and from a bed to a chair (including a wheelchair)?: A Lot Help needed standing up from a chair using your arms (e.g., wheelchair or bedside chair)?: A Lot Help needed to walk in hospital room?: A Lot Help needed climbing 3-5 steps with a railing? : Total 6 Click Score: 11    End of Session Equipment Utilized During Treatment: Gait belt Activity Tolerance: Patient tolerated treatment well;Patient limited by fatigue Patient left: in chair;with call bell/phone within reach;with family/visitor present Nurse Communication: Mobility status PT Visit Diagnosis: Unsteadiness on feet (R26.81);Other abnormalities of gait and mobility (R26.89);Muscle weakness (generalized) (M62.81);History of falling (Z91.81)    Time: 4098-1191 PT Time Calculation (min) (ACUTE ONLY): 27 min   Charges:   PT Evaluation $PT Eval Moderate Complexity: 1 Mod PT Treatments $Therapeutic Activity: 23-37 mins PT General Charges $$ ACUTE PT VISIT: 1 Visit         1:15 PM, 11/15/23 Walton Guppy, MPT  Physical Therapist with [facility] [phone] office [phone] mobile phone

## 2023-11-15 NOTE — ED Provider Notes (Signed)
 Patient boarding in ED for generalized weakness, fall risk Hx of dementia and CLL, chronic leukocytosis.  Pending PT eval and TOC assistance with placement Home meds re-ordered by myself this morning Diet ordered   Arvilla Birmingham, MD 11/15/23 [phone]

## 2023-11-15 NOTE — Plan of Care (Signed)
  Problem: Acute Rehab PT Goals(only PT should resolve) Goal: Pt Will Go Supine/Side To Sit Outcome: Progressing Flowsheets (Taken 11/15/2023 1320) Pt will go Supine/Side to Sit:  with moderate assist  with minimal assist Goal: Patient Will Transfer Sit To/From Stand Outcome: Progressing Flowsheets (Taken 11/15/2023 1320) Patient will transfer sit to/from stand: with moderate assist Goal: Pt Will Transfer Bed To Chair/Chair To Bed Outcome: Progressing Flowsheets (Taken 11/15/2023 1320) Pt will Transfer Bed to Chair/Chair to Bed: with mod assist Goal: Pt Will Ambulate Outcome: Progressing Flowsheets (Taken 11/15/2023 1320) Pt will Ambulate:  15 feet  with moderate assist  with rolling walker   1:20 PM, 11/15/23 Walton Guppy, MPT Physical Therapist with [facility] [phone] office [phone] mobile phone

## 2023-11-15 NOTE — ED Notes (Signed)
 Pt has a hard time swallowing. This nurse crushed meds as the wife at bedside said she did at home. Meds were given with apple sauce. Pt would hold apple sauce and water  in his mouth and took some time before he swallowed the contents in his mouth. Wife said he just takes the meds as a powder after they are crushed. Pt took what was left of the crushed meds as a powder, drank some water , and spit it out.

## 2023-11-15 NOTE — ED Notes (Signed)
 Memantine not available from normal ER pyxis. Had to be repulled from ER 2 pyxis

## 2023-11-15 NOTE — Plan of Care (Signed)
  Problem: Acute Rehab OT Goals (only OT should resolve) Goal: Pt. Will Perform Grooming Flowsheets (Taken 11/15/2023 0955) Pt Will Perform Grooming: with set-up Goal: Pt. Will Perform Upper Body Dressing Flowsheets (Taken 11/15/2023 0955) Pt Will Perform Upper Body Dressing: with min assist Goal: Pt. Will Transfer To Toilet Flowsheets (Taken 11/15/2023 0955) Pt Will Transfer to Toilet:  with min assist  stand pivot transfer Goal: Pt. Will Perform Toileting-Clothing Manipulation Flowsheets (Taken 11/15/2023 0955) Pt Will Perform Toileting - Clothing Manipulation and hygiene: with mod assist Goal: Pt/Caregiver Will Perform Home Exercise Program Flowsheets (Taken 11/15/2023 0955) Pt/caregiver will Perform Home Exercise Program:  Increased strength  Increased ROM  With minimal assist  Kam Rahimi OT, MOT

## 2023-11-16 ENCOUNTER — Other Ambulatory Visit: Payer: Self-pay

## 2023-11-16 NOTE — ED Provider Notes (Signed)
 Emergency Medicine Observation Re-evaluation Note  [name] is a 78 y.o. male, seen on rounds today.  Pt initially presented to the ED for complaints of Weakness Currently, the patient is resting comfortably.  Physical Exam  BP 122/64   Pulse 78   Temp 99.6 F (37.6 C) (Axillary)   Resp 18   Ht 1.6 m (5' 3)   Wt 63 kg   SpO2 96%   BMI 24.60 kg/m  Physical Exam General: No distress Cardiac: Normal heart rate Lungs: Clear to auscultation bilaterally Psych: No internal stimuli  ED Course / MDM  EKG:EKG Interpretation Date/Time:  Tuesday November 14 2023 17:40:02 EDT Ventricular Rate:  83 PR Interval:  140 QRS Duration:  81 QT Interval:  368 QTC Calculation: 433 R Axis:   -23  Text Interpretation: Sinus rhythm Abnormal R-wave progression, late transition Inferior infarct, old Confirmed by Shyrl Doyne [phone]) on 11/14/2023 6:55:12 PM  I have reviewed the labs performed to date as well as medications administered while in observation.  Recent changes in the last 24 hours include awaiting placement.  Plan  Current plan is for placement in skilled nursing facility, [place] 2 has been completed.    Early Glisson, MD 11/16/23 [phone]

## 2023-11-16 NOTE — TOC Progression Note (Signed)
 Transition of Care [facility]) - Progression Note    Patient Details  Name: [name] MRN: [id number] Date of Birth: [birth year]  Transition of Care [facility]) CM/SW Contact  Joslyn Nim, RN Phone Number: 11/16/2023, 1:21 PM  Clinical Narrative:    CM spoke with wife and her cousin Marston Skiff ,[phone], who is assisting her with this process. CM informed wife that [place] and [facility] has accepted patient. Wife is aware that [facility] plan to assess patient today. Wife want to know what [facility] says. She want to make sure that  the facility can met patient's skill needs and have a memory care unit. She does not want [facility] because of a recent bad experience with a family member who was a resident there.   Expected Discharge Plan: Skilled Nursing Facility    Expected Discharge Plan and Services In-house Referral: NA   Post Acute Care Choice: NA Living arrangements for the past 2 months: Single Family Home                                       Social Determinants of Health (SDOH) Interventions SDOH Screenings   Food Insecurity: No Food Insecurity (11/07/2023)  Housing: Low Risk  (11/07/2023)  Transportation Needs: No Transportation Needs (11/07/2023)  Utilities: Not At Risk (11/07/2023)  Depression (PHQ2-9): Medium Risk (11/07/2023)  Tobacco Use: Medium Risk (11/14/2023)    Readmission Risk Interventions     No data to display

## 2023-11-16 NOTE — Patient Outreach (Signed)
 Complex Care Management   Visit Note  11/16/2023  Name:  [name] MRN: [id number] DOB: [birth year]  Situation: Referral received for Complex Care Management related to Dementia I obtained verbal consent from Caregiver.  Visit completed with caregiver  on the phone. Patient is currently in the hospital and caregiver is overwhelmed with the placement process. LCSW spoke with hospital staff to receive an update about placement options. LCSW encouraged caregiver to utilize self-care strategies.  Background:   Past Medical History:  Diagnosis Date   Acute MI, inferolateral wall, initial episode of care (HCC) 06/16/2015   99% dCx    Acute respiratory failure with hypoxia (HCC) 05/22/2019   BPH (benign prostatic hyperplasia) 04/08/2011   CAD S/P percutaneous coronary angioplasty 11/23/12; 06/16/15   a. NSTEMI 2014 with 2 overlapping mRCA lesions - Xience Xpedition DES 2.75 mm x 18 mm (3.0 mm). b. Inf-Lat STEMI 06/16/2015 - dCx Asp Thrombectomy & PCI 3.0 x 15 Xience drDES (3.6 mm).   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     Stable.  No melena, hematochezia or hematuria.     Ischemic cardiomyopathy    Echo 06/18/2015 EF 35-40% after lateral MI -> repeat echo 08/06/2015: EF 40-45% with basal-mid inferolateral akinesis and hypokinesis of the lateral wall.   Memory loss 07/09/2019   Near syncope 11/08/2022   NSTEMI (non-ST elevated myocardial infarction) (HCC) 11/23/2012   NSTEMI 11/23/2012 RCA lesion - PCI with DES; moderate LAD disease   PTSD (post-traumatic stress disorder)    With Depression - since MI   Recurrent coronary arteriosclerosis after percutaneous transluminal coronary angioplasty 11/24/2012   PCI of mid RCA -- Xience eXp 2.75 mm x 18 mm (post-dil to 3.1 mm) 2014  PCI of CFX with DES 06/16/15     Last Assessment & Plan:    DES stents in the RCA and LCx.  No recurrent angina symptoms.     Plan: Continue monotherapy with Plavix .   Okay to hold Plavix  5 days preop for surgeries or procedures.      Assessment: Patient Reported Symptoms:  Cognitive Cognitive Status: Confused or disoriented, Requires Assistance Decision Making Cognitive/Intellectual Conditions Management [RPT]: Other (dementia)      Neurological Neurological Review of Symptoms: No symptoms reported    HEENT HEENT Symptoms Reported: Change or loss of hearing (hearing aides, not wearing them) HEENT Management Strategies: Medical device (hearing aid)    Cardiovascular Cardiovascular Symptoms Reported: No symptoms reported Does patient have uncontrolled Hypertension?:  No Cardiovascular Conditions: Hypertension Cardiovascular Management Strategies: Medication therapy  Respiratory Respiratory Symptoms Reported: No symptoms reported Respiratory Conditions: Seasonal allergies  Endocrine Patient reports the following symptoms related to hypoglycemia or hyperglycemia : No symptoms reported Is patient diabetic?: No    Gastrointestinal Gastrointestinal  Symptoms Reported: Change in appetite Additional Gastrointestinal Details: caregiver states that pateint was not eating and weak. Patient is currently at the hospital Gastrointestinal Management Strategies: Adequate rest, Medication therapy    Genitourinary Genitourinary Symptoms Reported: Incontinence Genitourinary Conditions: Incontinence Genitourinary Management Strategies: Incontinence garment/pad  Integumentary Integumentary Symptoms Reported: No symptoms reported    Musculoskeletal Musculoskelatal Symptoms Reviewed: Difficulty walking, Weakness Additional Musculoskeletal Details: Patient was weaking and has difficulty walking, patient is in hospital Musculoskeletal Conditions: Mobility limited Musculoskeletal Management Strategies: Adequate rest, Medical device (Caregiver reports that patient has walker however does not use it .) Falls in the past year?: Yes Number of falls in past year: 2 or more Was there an injury with Fall?: No Fall Risk Category Calculator: 2 Patient Fall Risk Level: Moderate Fall Risk Patient at Risk for Falls Due to: Impaired mobility  Psychosocial Psychosocial Symptoms Reported: Not assessed            11/07/2023    1:25 PM  Depression screen PHQ 2/9  Decreased Interest 2  Down, Depressed, Hopeless 2  PHQ - 2 Score 4  Altered sleeping 0  Tired, decreased energy 1  Change in appetite 1  Feeling bad or failure about yourself  1  Trouble concentrating 1  Moving slowly or fidgety/restless 1  Suicidal thoughts 0  PHQ-9 Score 9  Difficult doing work/chores Somewhat difficult    There were no vitals filed for this visit.  Medications Reviewed Today     Reviewed by Festus Hubert (Social Worker) on 11/16/23 at 1007  Med List Status: <None>   Medication Order Taking? Sig Documenting Provider Last Dose Status Informant  atorvastatin  (LIPITOR) 40 MG tablet [id number] No Take 1 tablet (40 mg total) by mouth daily. Zorita Hiss, NP 11/13/2023  Bedtime Active Spouse/Significant Other, Pharmacy Records  atorvastatin  (LIPITOR) tablet 40 mg [id number]   Arvilla Birmingham, MD  Active   cetirizine (ZYRTEC) 10 MG tablet [id number] No Take 1 tablet by mouth daily. [provider] 11/13/2023 Bedtime Active Spouse/Significant Other, Pharmacy Records  clopidogrel  (PLAVIX ) 75 MG tablet [id number] No TAKE 1 TABLET BY MOUTH EVERY DAY  Patient taking differently: Take 75 mg by mouth daily.   Lenor Raddle, MD 11/14/2023 11:00 AM Active Spouse/Significant Other, Pharmacy Records  clopidogrel  (PLAVIX ) tablet 75 mg [id number]   Arvilla Birmingham, MD  Active   donepezil (ARICEPT) 5 MG tablet [id number] No Take 1 tablet by mouth daily. [provider] 11/13/2023 Morning Active Spouse/Significant Other, Pharmacy Records  donepezil (ARICEPT) tablet 5 mg [id number]   Arvilla Birmingham, MD  Active   famotidine (PEPCID) 20 MG tablet [id number] No Take 1 tablet every day by oral route. [provider] 11/13/2023 Morning Active Spouse/Significant Other, Pharmacy Records  famotidine (PEPCID) tablet 20 mg [id number]   Arvilla Birmingham, MD  Active   furosemide  (LASIX ) 20 MG tablet [id number] No Take 1 tablet (20 mg total) by mouth every Monday, Wednesday, and Friday. Arleen Lacer, MD 11/13/2023 Morning Active Spouse/Significant Other, Pharmacy Records  furosemide  (LASIX ) tablet 20 mg [id number]   Arvilla Birmingham, MD  Active   lisinopril  (ZESTRIL ) 2.5 MG tablet [id number] No Take 1 tablet (2.5 mg total) by mouth  every Tuesday, Thursday, Saturday, and Sunday. Arleen Lacer, MD 11/14/2023 Morning Active Spouse/Significant Other, Pharmacy Records  lisinopril  (ZESTRIL ) tablet 2.5 mg [id number]   Arvilla Birmingham, MD  Active   loratadine [facility]) tablet 10 mg [id number]   Arvilla Birmingham, MD  Active   memantine (NAMENDA) 10 MG tablet [id number] No Take 10 mg by mouth 2 (two) times daily. [provider] 11/14/2023 Morning Active  Spouse/Significant Other, Pharmacy Records  memantine [facility]) tablet 10 mg [id number]   Arvilla Birmingham, MD  Active   mirtazapine (REMERON) 7.5 MG tablet [id number] No Take 1 tablet by mouth daily. [provider] 11/13/2023 Bedtime Active Spouse/Significant Other, Pharmacy Records  mirtazapine (REMERON) tablet 7.5 mg [id number]   Arvilla Birmingham, MD  Active   nitroGLYCERIN  (NITROSTAT ) 0.4 MG SL tablet [id number] No Place 0.4 mg under the tongue every 5 (five) minutes as needed for chest pain. [provider] Unknown Active Spouse/Significant Other, Pharmacy Records           Med Note (WARD, ANGELICA G   Tue Nov 14, 2023  6:23 PM)    ondansetron  (ZOFRAN -ODT) 4 MG disintegrating tablet [id number] No Take 1 tablet (4 mg total) by mouth every 8 (eight) hours as needed for nausea or vomiting. Corbin Dess, PA-C Past Month Active Spouse/Significant Other, Pharmacy Records  risperiDONE (RISPERDAL) 0.25 MG tablet [id number] No Take 0.25 mg by mouth 2 (two) times daily. [provider] 11/13/2023 Evening Active Spouse/Significant Other, Pharmacy Records  risperiDONE (RISPERDAL) tablet 0.25 mg [id number]   Arvilla Birmingham, MD  Active             Recommendation:   PCP Follow-up Continue Current Plan of Care  Follow Up Plan:   Telephone follow-up 11/23/2023 at 10:30 am  Georgine Kitchens, MSW, LCSW [place]  [facility] Licensed Clinical Social Worker Direct Dial: [phone]

## 2023-11-16 NOTE — Plan of Care (Signed)
  Problem: SLP Dysphagia Goals Goal: Patient will utilize recommended strategies Description: Patient will utilize recommended strategies during swallow to increase swallowing safety with Outcome: Progressing   Thank you,  Claudetta Cuba, CCC-SLP [phone]

## 2023-11-16 NOTE — Evaluation (Signed)
 Clinical/Bedside Swallow Evaluation Patient Details  Name: [name] MRN: [id number] Date of Birth: 1945-12-23  Today's Date: 11/16/2023 Time: SLP Start Time (ACUTE ONLY): 1030 SLP Stop Time (ACUTE ONLY): 1054 SLP Time Calculation (min) (ACUTE ONLY): 24 min  Past Medical History:  Past Medical History:  Diagnosis Date   Acute MI, inferolateral wall, initial episode of care (HCC) 06/16/2015   99% dCx    Acute respiratory failure with hypoxia (HCC) 05/22/2019   BPH (benign prostatic hyperplasia) 04/08/2011   CAD S/P percutaneous coronary angioplasty 11/23/12; 06/16/15   a. NSTEMI 2014 with 2 overlapping mRCA lesions - Xience Xpedition DES 2.75 mm x 18 mm (3.0 mm). b. Inf-Lat STEMI 06/16/2015 - dCx Asp Thrombectomy & PCI 3.0 x 15 Xience drDES (3.6 mm).   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     Stable.  No melena, hematochezia or hematuria.     Ischemic cardiomyopathy    Echo 06/18/2015 EF 35-40% after lateral MI -> repeat echo  08/06/2015: EF 40-45% with basal-mid inferolateral akinesis and hypokinesis of the lateral wall.   Memory loss 07/09/2019   Near syncope 11/08/2022   NSTEMI (non-ST elevated myocardial infarction) (HCC) 11/23/2012   NSTEMI 11/23/2012 RCA lesion - PCI with DES; moderate LAD disease   PTSD (post-traumatic stress disorder)    With Depression - since MI   Recurrent coronary arteriosclerosis after percutaneous transluminal coronary angioplasty 11/24/2012   PCI of mid RCA -- Xience eXp 2.75 mm x 18 mm (post-dil to 3.1 mm) 2014  PCI of CFX with DES 06/16/15     Last Assessment & Plan:    DES stents in the RCA and LCx.  No recurrent angina symptoms.     Plan: Continue monotherapy with Plavix .   Okay to hold Plavix  5 days preop for surgeries or procedures.     Past Surgical History:  Past Surgical History:  Procedure Laterality Date   APPENDECTOMY  1968-70   CARDIAC CATHETERIZATION N/A 06/16/2015   Procedure: Left Heart Cath and Coronary Angiography;  Surgeon: Lucendia Rusk, MD;  Location: [facility] INVASIVE CV LAB;  Service: Cardiovascular: 99% thrombotic dCx -> asp thrombectomy & PCI. RCA Stents Patent.    CARDIAC CATHETERIZATION N/A 06/16/2015   Procedure: Coronary Stent Intervention;  Surgeon: Lucendia Rusk, MD;  Location: [facility] INVASIVE CV LAB;  Service: Cardiovascular: PCI dCx = thrombectomy -> 3.0 x 15 Xience drug-eluting stent (3.6 mm)    COLONOSCOPY  2008   QIH:[id number] diverticula, diminutive polyp  in the cecum, s/p bx Normal rectum. Tubular adenoma   COLONOSCOPY N/A 10/17/2012   Dr. Riley Cheadle: colonic diverticulosis, tubular adenoma, surveillance due May 2019   COLONOSCOPY N/A 02/21/2018   Procedure: COLONOSCOPY;  Surgeon: Suzette Espy, MD; Diverticulosis of the sigmoid and descending colon, otherwise normal exam.  No recommendations to repeat.   CORONARY STENT PLACEMENT  11/23/2012   Mid RCA -- Xience eXp - 2.75 mm x 18 mm DES (3.19mm) , Dr. Addie Holstein   ESOPHAGOGASTRODUODENOSCOPY N/A  03/03/2021   Procedure: ESOPHAGOGASTRODUODENOSCOPY (EGD);  Surgeon: Suzette Espy, MD;  Location: [facility];  Service: Endoscopy;  Laterality: N/A;  12:00pm   IR IVC FILTER PLMT / S&I /IMG GUID/MOD SED  05/27/2019   IR RADIOLOGIST EVAL & MGMT  09/11/2019   LEFT HEART CATHETERIZATION WITH CORONARY ANGIOGRAM N/A 11/23/2012   Procedure: LEFT HEART CATHETERIZATION WITH CORONARY ANGIOGRAM;  Surgeon: Arleen Lacer, MD;  Location: [facility] CATH LAB;  Service: Cardiovascular: NSTEMI: Culprit = mid RCA 95% & 75%; LAD ~40-50%, Small RI ~60%; EF ~60%, mild inf-basal HK      MALONEY DILATION N/A 03/03/2021   Procedure: MALONEY DILATION;  Surgeon: Suzette Espy, MD;  Location: [facility];  Service: Endoscopy;  Laterality: N/A;   TRANSTHORACIC ECHOCARDIOGRAM  January 2017; March 2017   a. EF 35-40%. Entire inferior hypokinesis and akinesis of the basal and mid inferolateral and anterolateral /distal lateral walls;; b. EF 40 and 45%. Akinesis of basal-mid inferolateral wall. Hypokinesis of entire lateral wall.   HPI:  [name] is a 78 y.o. male.     78 year old male with a history of CAD status post PCI, CHF, DVT sp IVC filter, hypertension, hyperlipidemia, CLL and dementia who presents to the emergency increasing weakness history obtained per EMS.  They state that his wife says that he was also not eating and falling.  She feels that she cannot take care of him at home. (per MD). BSE requested due to Pt pocketing medications.    Assessment / Plan / Recommendation  Clinical Impression  Pt presents with cognitive based dysphagia in setting of altered mental status, cognitive decline, and fluctuating alertness. Pt's sister was present for the evaluation in the ED, his wife went home to rest. Sister reports continued decline in alertness and reduced PO intake over the last several weeks. He has become more challenging to manage at home. Medications are crushed into a fine powder and mixed with a small  amount of Ensure. Pt with varied alertness with SLP during bedside swallow evaluation. Upper partial was removed yesterday per family due to poor fit. Pt assessed with ice chips, thin water  via tsp/cup/straw, puree, and graham crackers. No overt coughing noted, however Pt with oral phase dysphagia with reduced attention to task, poor lip closure, variable ability to suck from the straw, and prolonged oral transit with solids. Pt with improved oral control and awareness when he was alert enough to self feed the graham cracker, however he alternates with reduced alertness. Recommend D1/puree and thin liquids via cup/straw, but will need 1:1 feeder assist and encourage attempts at self feeding with hand over hand assist to improve his attention and safety to task. Prognosis for advancement in textures is good with improved alertness and adequate assist/supervision with meals (hopefully going to SNF soon). Present PO medication crushed as able in small amount of puree/pudding/ice cream and follow with liquid wash (he may be more responsive to cold stimulus of ice chip or ice cream).  SLP will follow during acute stay as schedule permits. Above to RN.    SLP Visit Diagnosis: Dysphagia, oral phase (R13.11)    Aspiration Risk  Mild aspiration risk;Risk for inadequate nutrition/hydration    Diet Recommendation Dysphagia 1 (Puree);Thin liquid    Liquid Administration via: Cup;Straw Medication Administration: Crushed with puree Supervision: Staff to assist with self feeding;Full supervision/cueing for compensatory strategies (try to encourage self feeding, hand over hand assist) Compensations: Slow rate;Small sips/bites;Minimize environmental distractions;Follow solids with liquid Postural Changes: Seated upright at 90 degrees;Remain upright for at least 30 minutes after po intake    Other  Recommendations Oral Care Recommendations: Oral care BID;Staff/trained caregiver to provide oral care     Assistance  Recommended at Discharge    Functional Status Assessment Patient has had a recent decline in their functional status and demonstrates the ability to make significant improvements in function in a reasonable and predictable amount of time.  Frequency and Duration min 2x/week  1 week       Prognosis Prognosis for improved oropharyngeal function: Fair Barriers to Reach Goals: Cognitive deficits      Swallow Study   General Date of Onset: 11/14/23 HPI: JAVAD SALVA is a 78 y.o. male.     78 year old male with a history of CAD status post PCI, CHF, DVT sp IVC filter, hypertension, hyperlipidemia, CLL and dementia who presents to the emergency increasing weakness history obtained per EMS.  They state that his wife says that he was also not eating and falling.  She feels that she cannot take care of him at home. (per MD). BSE requested due to Pt pocketing medications. Type of Study: Bedside Swallow Evaluation Diet Prior to this Study: NPO Temperature Spikes Noted: No Respiratory Status: Room air History of Recent Intubation: No Behavior/Cognition: Cooperative;Lethargic/Drowsy;Requires cueing Oral Cavity Assessment: Within Functional Limits Oral Care Completed by SLP: Yes Oral Cavity - Dentition:  (upper plate removed, has some lower teeth) Vision: Functional for self-feeding Self-Feeding Abilities: Needs assist Patient Positioning: Upright in bed Baseline Vocal Quality: Normal Volitional Cough: Cognitively unable to elicit Volitional Swallow: Unable to elicit    Oral/Motor/Sensory Function Overall Oral Motor/Sensory Function: Generalized oral weakness (Pt with limited participation to follow commands during oral motor evaluation, no gross asymmetry)   Ice Chips Ice chips: Within functional limits Presentation: Spoon   Thin Liquid Thin Liquid: Impaired Presentation: Cup;Spoon;Straw Oral Phase Impairments: Reduced labial seal;Poor awareness of bolus (Pt improved intake with a straw, but  required cues for use (finger occlusion and slow release while he sucks and then have him suck from the straw)) Oral Phase Functional Implications: Right anterior spillage    Nectar Thick Nectar Thick Liquid: Not tested   Honey Thick Honey Thick Liquid: Not tested   Puree Puree: Impaired Presentation: Spoon Oral Phase Impairments: Reduced lingual movement/coordination Oral Phase Functional Implications: Prolonged oral transit;Oral holding   Solid     Solid: Impaired Presentation: Self Fed Oral Phase Functional Implications: Prolonged oral transit;Oral residue     Thank you,  Claudetta Cuba, CCC-SLP [phone]  Christelle Igoe 11/16/2023,12:07 PM

## 2023-11-16 NOTE — ED Notes (Signed)
 Pt given bed bath and peri care completed. Clean primofit placed, pt repositioned to lying on R side with pillow under side. Pt states he is more comfortable.   Pt given meds crushed in Ensure per wife recommendation. No pudding available. Pt tolerated well. No coughing or trouble swallowing noted.   Wife provided with new warm blanket. No further needs at this time.

## 2023-11-16 NOTE — ED Notes (Signed)
 Wife and nephew at pt bedside.

## 2023-11-17 ENCOUNTER — Other Ambulatory Visit

## 2023-11-17 NOTE — ED Notes (Signed)
 Pt transferred back to bed with 2 person max assist.

## 2023-11-17 NOTE — Progress Notes (Signed)
 Physical Therapy Treatment Patient Details Name: [name] MRN: [id number] DOB: 11-10-1945 Today's Date: 11/17/2023   History of Present Illness [name] is a 78 y.o. male.     78 year old male with a history of CAD status post PCI, CHF, DVT sp IVC filter, hypertension, hyperlipidemia, CLL and dementia who presents to the emergency increasing weakness history obtained per EMS.  They state that his wife says that he was also not eating and falling.  She feels that she cannot take care of him at home.    PT Comments  Patient presents lethargic, agreeable for and able to participate with therapy. Patient demonstrates slow labored movement for sitting up at bedside requiring Max assist due to weakness, unable to use RW for transferring to chair due to weakness, required Max assist stand pivot with knees blocked, once seated required active assistance for completing most BLE exercises. Patient tolerated sitting up in chair after therapy with his spouse present. Patient will benefit from continued skilled physical therapy in hospital and recommended venue below to increase strength, balance, endurance for safe ADLs and gait.     If plan is discharge home, recommend the following: A lot of help with walking and/or transfers;A lot of help with bathing/dressing/bathroom;Assistance with cooking/housework;Assist for transportation;Help with stairs or ramp for entrance   Can travel by private vehicle     No  Equipment Recommendations  None recommended by PT    Recommendations for Other Services       Precautions / Restrictions Precautions Precautions: Fall Recall of Precautions/Restrictions: Impaired Restrictions Weight Bearing Restrictions Per Provider Order: No     Mobility  Bed Mobility Overal bed mobility: Needs Assistance Bed Mobility: Supine to Sit     Supine to sit: Max assist, HOB elevated     General bed mobility comments: increased time, labored movement     Transfers Overall transfer level: Needs assistance Equipment used: Rolling walker (2 wheels) Transfers: Sit to/from Stand, Bed to chair/wheelchair/BSC Sit to Stand: Max assist Stand pivot transfers: Max assist Step pivot transfers: Max assist       General transfer comment: frequent buckling of knees, unable to use RW, required Max assist stand pivot with knees blocked    Ambulation/Gait Ambulation/Gait assistance: Max assist Gait Distance (Feet): 1 Feet Assistive device: Rolling walker (2 wheels) Gait Pattern/deviations: Decreased step length - right, Decreased step length - left, Decreased stride length, Knees buckling, Shuffle, Trunk flexed Gait velocity: slow     General Gait Details: very unsteady onf feet with buckling of knees, limited to 1 side step in front of chair   Stairs             Wheelchair Mobility     Tilt Bed    Modified Rankin (Stroke Patients Only)       Balance Overall balance assessment: Needs assistance Sitting-balance support: Feet supported, No upper extremity supported Sitting balance-Leahy Scale: Fair Sitting balance - Comments: seated at EOB   Standing balance support: During functional activity, Bilateral upper extremity supported, Reliant on assistive device for balance Standing balance-Leahy Scale: Poor Standing balance comment: using RW                            Communication Communication Communication: Impaired Factors Affecting Communication: Reduced clarity of speech  Cognition Arousal: Lethargic Behavior During Therapy: WFL for tasks assessed/performed   PT - Cognitive impairments: History of cognitive impairments  Following commands: Impaired Following commands impaired: Follows one step commands inconsistently    Cueing Cueing Techniques: Verbal cues, Tactile cues  Exercises General Exercises - Lower Extremity Ankle Circles/Pumps: AROM, Strengthening, 5 reps,  Seated Long Arc Quad: Strengthening, Both, AAROM, 10 reps, Seated Hip Flexion/Marching: AAROM, Strengthening, Both, 10 reps, Seated    General Comments        Pertinent Vitals/Pain Pain Assessment Pain Assessment: Faces Faces Pain Scale: Hurts little more Pain Location: B LE with movement Pain Descriptors / Indicators: Grimacing Pain Intervention(s): Limited activity within patient's tolerance, Monitored during session, Repositioned    Home Living                          Prior Function            PT Goals (current goals can now be found in the care plan section) Acute Rehab PT Goals Patient Stated Goal: return home PT Goal Formulation: With patient/family Time For Goal Achievement: 11/29/23 Potential to Achieve Goals: Good Progress towards PT goals: Progressing toward goals    Frequency    Min 2X/week      PT Plan      Co-evaluation PT/OT/SLP Co-Evaluation/Treatment: Yes Reason for Co-Treatment: To address functional/ADL transfers PT goals addressed during session: Mobility/safety with mobility;Balance;Proper use of DME;Strengthening/ROM OT goals addressed during session: ADL's and self-care      AM-PAC PT 6 Clicks Mobility   Outcome Measure  Help needed turning from your back to your side while in a flat bed without using bedrails?: A Lot Help needed moving from lying on your back to sitting on the side of a flat bed without using bedrails?: A Lot Help needed moving to and from a bed to a chair (including a wheelchair)?: A Lot Help needed standing up from a chair using your arms (e.g., wheelchair or bedside chair)?: A Lot Help needed to walk in hospital room?: A Lot Help needed climbing 3-5 steps with a railing? : Total 6 Click Score: 11    End of Session Equipment Utilized During Treatment: Gait belt Activity Tolerance: Patient tolerated treatment well;Patient limited by fatigue;Patient limited by lethargy Patient left: in chair;with call  bell/phone within reach;with family/visitor present Nurse Communication: Mobility status PT Visit Diagnosis: Unsteadiness on feet (R26.81);Other abnormalities of gait and mobility (R26.89);Muscle weakness (generalized) (M62.81);History of falling (Z91.81)     Time: 9604-5409 PT Time Calculation (min) (ACUTE ONLY): 24 min  Charges:    $Therapeutic Activity: 23-37 mins PT General Charges $$ ACUTE PT VISIT: 1 Visit                     11:37 AM, 11/17/23 Walton Guppy, MPT Physical Therapist with [facility] [address] [phone] office [phone] mobile phone

## 2023-11-17 NOTE — ED Provider Notes (Signed)
 Emergency Medicine Observation Re-evaluation Note  [name] is a 78 y.o. male, seen on rounds today.  Pt initially presented to the ED for complaints of Weakness Currently, the patient is generally weak, stable appearing, no complaints overnight.  Awaiting medical placement.  Physical Exam  BP 123/74 (BP Location: Right Arm)   Pulse 99   Temp 98.5 F (36.9 C) (Oral)   Resp 19   Ht 1.6 m (5' 3)   Wt 63 kg   SpO2 92%   BMI 24.60 kg/m  Physical Exam General: No distress Cardiac: Normal heart rate and blood pressure Lungs: No increased work of breathing Psych: Calm  ED Course / MDM  EKG:EKG Interpretation Date/Time:  Tuesday November 14 2023 17:40:02 EDT Ventricular Rate:  83 PR Interval:  140 QRS Duration:  81 QT Interval:  368 QTC Calculation: 433 R Axis:   -23  Text Interpretation: Sinus rhythm Abnormal R-wave progression, late transition Inferior infarct, old Confirmed by Shyrl Doyne [phone]) on 11/14/2023 6:55:12 PM  I have reviewed the labs performed to date as well as medications administered while in observation.  Recent changes in the last 24 hours include placement today hopefully.  Plan  Current plan is for hopefully placement, insurance authorization occurring today  This patient is medically stable for transfer and placement in a skilled nursing facility.    Early Glisson, MD 11/17/23 [phone]

## 2023-11-17 NOTE — Progress Notes (Signed)
 Occupational Therapy Treatment Patient Details Name: [name] MRN: [id number] DOB: 17-Apr-1946 Today's Date: 11/17/2023   History of present illness [name] is a 78 y.o. male.     78 year old male with a history of CAD status post PCI, CHF, DVT sp IVC filter, hypertension, hyperlipidemia, CLL and dementia who presents to the emergency increasing weakness history obtained per EMS.  They state that his wife says that he was also not eating and falling.  She feels that she cannot take care of him at home.   OT comments  Pt agreeable to OT and PT co-treatment. Pt very lethargic to start the session but arousal increased some once sitting. Max A for bed mobility and transfer to chair. Attempted standing with RW but pt was very unsteady. Graded to stand pivot without RW. B UE still very weak. P/ROM of shoulder completed with attempting to get pt actively involved but this was minimal from the pt.       If plan is discharge home, recommend the following:  A lot of help with walking and/or transfers;A lot of help with bathing/dressing/bathroom;Assistance with cooking/housework;Assistance with feeding;Assist for transportation;Help with stairs or ramp for entrance;Direct supervision/assist for medications management;Supervision due to cognitive status   Equipment Recommendations  None recommended by OT          Precautions / Restrictions Precautions Precautions: Fall Recall of Precautions/Restrictions: Impaired Restrictions Weight Bearing Restrictions Per Provider Order: No       Mobility Bed Mobility Overal bed mobility: Needs Assistance Bed Mobility: Supine to Sit     Supine to sit: Max assist, HOB elevated     General bed mobility comments: much assist; labored movement; pt lethargic    Transfers Overall transfer level: Needs assistance Equipment used: Rolling walker (2 wheels) Transfers: Sit to/from Stand, Bed to chair/wheelchair/BSC Sit to Stand: Max assist Stand  pivot transfers: Max assist         General transfer comment: unsteady labored movement; weak B LE; attempted sit to stand with RW but pt unstable; graded to stand pivot transfer with knees blocked by PT.     Balance Overall balance assessment: Needs assistance Sitting-balance support: Feet supported, No upper extremity supported Sitting balance-Leahy Scale: Fair Sitting balance - Comments: seated at EOB   Standing balance support: During functional activity, Bilateral upper extremity supported, Reliant on assistive device for balance Standing balance-Leahy Scale: Poor Standing balance comment: using RW                           ADL either performed or assessed with clinical judgement   ADL                                              Extremity/Trunk Assessment Upper Extremity Assessment Upper Extremity Assessment: Generalized weakness   Lower Extremity Assessment Lower Extremity Assessment: Defer to PT evaluation        Vision   Vision Assessment?: No apparent visual deficits   Perception Perception Perception: Not tested   Praxis Praxis Praxis: Not tested   Communication Communication Communication: No apparent difficulties   Cognition Arousal: Lethargic Behavior During Therapy: WFL for tasks assessed/performed Cognition: History of cognitive impairments             OT - Cognition Comments: Mild difficulty following commands.  Following commands: Intact Following commands impaired: Follows one step commands inconsistently      Cueing   Cueing Techniques: Verbal cues  Exercises Exercises: Shoulder Shoulder Exercises Shoulder Flexion: 10 reps, PROM, Both, Seated                 Pertinent Vitals/ Pain       Pain Assessment Pain Assessment: Faces Faces Pain Scale: Hurts little more Pain Location: B LE with movement Pain Descriptors / Indicators: Grimacing Pain Intervention(s): Limited  activity within patient's tolerance, Monitored during session, Repositioned                                                          Frequency  Min 2X/week        Progress Toward Goals  OT Goals(current goals can now be found in the care plan section)  Progress towards OT goals: Progressing toward goals  Acute Rehab OT Goals Patient Stated Goal: get more help OT Goal Formulation: With family Time For Goal Achievement: 11/29/23 Potential to Achieve Goals: Good ADL Goals Pt Will Perform Grooming: with set-up Pt Will Perform Upper Body Dressing: with min assist Pt Will Transfer to Toilet: with min assist;stand pivot transfer Pt Will Perform Toileting - Clothing Manipulation and hygiene: with mod assist Pt/caregiver will Perform Home Exercise Program: Increased strength;Increased ROM;With minimal assist  Plan      Co-evaluation    PT/OT/SLP Co-Evaluation/Treatment: Yes Reason for Co-Treatment: To address functional/ADL transfers   OT goals addressed during session: ADL's and self-care                          End of Session Equipment Utilized During Treatment: Rolling walker (2 wheels);Gait belt  OT Visit Diagnosis: Unsteadiness on feet (R26.81);Other abnormalities of gait and mobility (R26.89);Muscle weakness (generalized) (M62.81);History of falling (Z91.81);Other symptoms and signs involving cognitive function   Activity Tolerance Patient tolerated treatment well   Patient Left in chair;with call bell/phone within reach;with family/visitor present   Nurse Communication Mobility status        Time: 1610-9604 OT Time Calculation (min): 16 min  Charges: OT General Charges $OT Visit: 1 Visit  [facility] OT, MOT   Thurnell Floss 11/17/2023, 10:03 AM

## 2023-11-17 NOTE — ED Notes (Signed)
 Pt awake up in recliner chair with wife at his side. PT put pt into recliner. Social worker called and stated wife is agreeable to [facility] and would like to get things started with insurance.

## 2023-11-17 NOTE — TOC Progression Note (Signed)
 Transition of Care [facility]) - Progression Note    Patient Details  Name: [name] MRN: [id number] Date of Birth: 1945-08-17  [facility]) CM/SW Contact  Joslyn Nim, RN Phone Number: 11/17/2023, 9:36 AM  Clinical Narrative:    CM confirmed that [facility] has offered patient a bed. CM called wife and informed her of the bed offer. Wife is in agreement. Insurance authorization will be submitted today.   Expected Discharge Plan: Skilled Nursing Facility    Expected Discharge Plan and Services In-house Referral: NA   Post Acute Care Choice: NA Living arrangements for the past 2 months: Single Family Home                                       Social Determinants of Health (SDOH) Interventions SDOH Screenings   Food Insecurity: No Food Insecurity (11/07/2023)  Housing: Low Risk  (11/07/2023)  Transportation Needs: No Transportation Needs (11/07/2023)  Utilities: Not At Risk (11/07/2023)  Depression (PHQ2-9): Medium Risk (11/07/2023)  Tobacco Use: Medium Risk (11/14/2023)    Readmission Risk Interventions     No data to display

## 2023-11-17 NOTE — ED Notes (Signed)
 Wife at bedside gave pt a wash up and helps pt with care. Nurse put crushed meds into med cup with chocolate ensure per wife request. Pt took some of meds and then attempted to start to spit out meds. During redirection pt put his fist up to nurse to reject medication. Pt did not hit nurse. Nurse told wife we will give it a minute and try again. Wife attempted in a different cup and pt took meds.

## 2023-11-17 NOTE — ED Notes (Signed)
 Pt resting in bed and wife at bedside watching t.v..

## 2023-11-18 NOTE — TOC Progression Note (Signed)
 Transition of Care [facility]) - Progression Note    Patient Details  Name: [name] MRN: [id number] Date of Birth: January 16, 1946  Transition of Care [facility]) CM/SW Contact  Orelia Binet, RN Phone Number: 11/18/2023, 9:22 AM  Clinical Narrative:   [facility] AUTH pending, [facility] will plan for Monday admission.     Expected Discharge Plan: Skilled Nursing Facility    Expected Discharge Plan and Services In-house Referral: NA   Post Acute Care Choice: NA Living arrangements for the past 2 months: Single Family Home                                       Social Determinants of Health (SDOH) Interventions SDOH Screenings   Food Insecurity: No Food Insecurity (11/07/2023)  Housing: Low Risk  (11/07/2023)  Transportation Needs: No Transportation Needs (11/07/2023)  Utilities: Not At Risk (11/07/2023)  Depression (PHQ2-9): Medium Risk (11/07/2023)  Tobacco Use: Medium Risk (11/14/2023)    Readmission Risk Interventions     No data to display

## 2023-11-18 NOTE — ED Notes (Addendum)
 Brief clean and dry. Purewick in place. Pt repositioned for comfort. No other needs at this time.

## 2023-11-18 NOTE — ED Notes (Signed)
 Pt's teeth cleaned with mouth swabs. Wife at bedside. Pt repositioned and heels floated.

## 2023-11-18 NOTE — ED Notes (Signed)
 Pt resting quietly with eyes closed. Respirations even and unlabored. No s/s of pain or discomfort. Wife at bedside. Wife voiced concerns about doctors not checking on pt. This nurse explained that pt is considered medically stable and waiting on bed at SNF. This nurse also explained that nursing staff continue to monitor and assess pt while remains in ED. Wife verbalized understanding.

## 2023-11-18 NOTE — ED Provider Notes (Signed)
 Emergency Medicine Observation Re-evaluation Note  [name] is a 78 y.o. male, seen on rounds today.  Pt initially presented to the ED for complaints of Weakness Currently, the patient is awaiting placement, sleepy, resting, no agitation.  Physical Exam  BP 103/69   Pulse 85   Temp 98.5 F (36.9 C) (Oral)   Resp 19   Ht 1.6 m (5' 3)   Wt 63 kg   SpO2 94%   BMI 24.60 kg/m  Physical Exam General: No distress Cardiac: Normal heart rate Lungs: No increased work of breathing, normal oxygenation Psych: Calm  ED Course / MDM  EKG:EKG Interpretation Date/Time:  Tuesday November 14 2023 17:40:02 EDT Ventricular Rate:  83 PR Interval:  140 QRS Duration:  81 QT Interval:  368 QTC Calculation: 433 R Axis:   -23  Text Interpretation: Sinus rhythm Abnormal R-wave progression, late transition Inferior infarct, old Confirmed by Shyrl Doyne [phone]) on 11/14/2023 6:55:12 PM  I have reviewed the labs performed to date as well as medications administered while in observation.  Recent changes in the last 24 hours include none.  Plan  Current plan is for placement, awaiting TOC to place in nursing facility.    Early Glisson, MD 11/18/23 [phone]

## 2023-11-18 NOTE — ED Notes (Addendum)
 Brief clean/dry. Pt able to swallow medications with applesauce and splenda added. Pt positioned onto right side and oral care provided. Wife at bedside.

## 2023-11-18 NOTE — ED Notes (Signed)
 Pt coughed up clear secretions. Face washed and oral care provided. Pt repositioned for comfort.

## 2023-11-18 NOTE — ED Notes (Signed)
 Wife at bedside giving pt soda. No needs at this time.

## 2023-11-19 MED ORDER — ACETAMINOPHEN 160 MG/5ML PO SOLN
640.0000 mg | Freq: Once | ORAL | Status: AC
  Administered 2023-11-19: 640 mg via ORAL
  Filled 2023-11-19: qty 20.3

## 2023-11-19 NOTE — ED Notes (Signed)
 Patient repositioned in bed off of right side and pillow placed under legs to help take pressure off of legs.

## 2023-11-19 NOTE — ED Notes (Signed)
 Pt's urine output is 350 cc

## 2023-11-19 NOTE — ED Notes (Signed)
 Patient is resting quietly with wife still at bedside.

## 2023-11-19 NOTE — ED Notes (Signed)
 Patient given liquid tylenol  due to leg pain.

## 2023-11-19 NOTE — ED Notes (Signed)
 Insurance [employer] is pending for placement at [facility]. TOC to follow for updates.

## 2023-11-19 NOTE — ED Notes (Signed)
 Patient is resting quietly with wife at bedside. Patient at a donut that was given to him by wife and he also had a ginger ale. Patient also had an apple sauce earlier this morning, the medic mixed it with a splenda packet in which she was told to do that in report and it was mixed with his morning meds. Patient states that he is comfortable.

## 2023-11-19 NOTE — ED Notes (Signed)
 Pt remains asleep, wife also asleep at bedside

## 2023-11-19 NOTE — ED Provider Notes (Signed)
 Emergency Medicine Observation Re-evaluation Note  [name] is a 78 y.o. male, seen on rounds today.  Pt initially presented to the ED for complaints of Weakness Currently, the patient is resting comfortably, no distress.  Physical Exam  BP 129/62   Pulse 80   Temp 97.8 F (36.6 C) (Oral)   Resp 18   Ht 1.6 m (5' 3)   Wt 63 kg   SpO2 98%   BMI 24.60 kg/m  Physical Exam General: No acute findings, calm Cardiac: Normal heart rate Lungs: No increased work of breathing no hypoxia Psych: No agitation, very calm  ED Course / MDM  EKG:EKG Interpretation Date/Time:  Tuesday November 14 2023 17:40:02 EDT Ventricular Rate:  83 PR Interval:  140 QRS Duration:  81 QT Interval:  368 QTC Calculation: 433 R Axis:   -23  Text Interpretation: Sinus rhythm Abnormal R-wave progression, late transition Inferior infarct, old Confirmed by Shyrl Doyne [phone]) on 11/14/2023 6:55:12 PM  I have reviewed the labs performed to date as well as medications administered while in observation.  Recent changes in the last 24 hours include no changes, pending placement.  Plan  Current plan is for should be transferred to [facility].    Early Glisson, MD 11/19/23 [phone]

## 2023-11-20 ENCOUNTER — Emergency Department (HOSPITAL_COMMUNITY)

## 2023-11-20 DIAGNOSIS — Z7189 Other specified counseling: Secondary | ICD-10-CM | POA: Diagnosis not present

## 2023-11-20 DIAGNOSIS — R6510 Systemic inflammatory response syndrome (SIRS) of non-infectious origin without acute organ dysfunction: Secondary | ICD-10-CM | POA: Diagnosis not present

## 2023-11-20 DIAGNOSIS — L89152 Pressure ulcer of sacral region, stage 2: Secondary | ICD-10-CM | POA: Diagnosis not present

## 2023-11-20 DIAGNOSIS — E785 Hyperlipidemia, unspecified: Secondary | ICD-10-CM | POA: Diagnosis not present

## 2023-11-20 DIAGNOSIS — K219 Gastro-esophageal reflux disease without esophagitis: Secondary | ICD-10-CM | POA: Diagnosis not present

## 2023-11-20 DIAGNOSIS — R3989 Other symptoms and signs involving the genitourinary system: Secondary | ICD-10-CM | POA: Diagnosis not present

## 2023-11-20 DIAGNOSIS — Z515 Encounter for palliative care: Secondary | ICD-10-CM | POA: Diagnosis not present

## 2023-11-20 DIAGNOSIS — Z95828 Presence of other vascular implants and grafts: Secondary | ICD-10-CM | POA: Diagnosis not present

## 2023-11-20 DIAGNOSIS — I5022 Chronic systolic (congestive) heart failure: Secondary | ICD-10-CM | POA: Diagnosis not present

## 2023-11-20 DIAGNOSIS — F4312 Post-traumatic stress disorder, chronic: Secondary | ICD-10-CM | POA: Diagnosis not present

## 2023-11-20 DIAGNOSIS — F01B18 Vascular dementia, moderate, with other behavioral disturbance: Secondary | ICD-10-CM | POA: Diagnosis not present

## 2023-11-20 DIAGNOSIS — I2510 Atherosclerotic heart disease of native coronary artery without angina pectoris: Secondary | ICD-10-CM | POA: Diagnosis not present

## 2023-11-20 DIAGNOSIS — K5730 Diverticulosis of large intestine without perforation or abscess without bleeding: Secondary | ICD-10-CM | POA: Diagnosis not present

## 2023-11-20 DIAGNOSIS — R0989 Other specified symptoms and signs involving the circulatory and respiratory systems: Secondary | ICD-10-CM | POA: Diagnosis not present

## 2023-11-20 DIAGNOSIS — R109 Unspecified abdominal pain: Secondary | ICD-10-CM | POA: Diagnosis not present

## 2023-11-20 DIAGNOSIS — F32A Depression, unspecified: Secondary | ICD-10-CM | POA: Diagnosis not present

## 2023-11-20 DIAGNOSIS — R52 Pain, unspecified: Secondary | ICD-10-CM | POA: Diagnosis not present

## 2023-11-20 DIAGNOSIS — I130 Hypertensive heart and chronic kidney disease with heart failure and stage 1 through stage 4 chronic kidney disease, or unspecified chronic kidney disease: Secondary | ICD-10-CM | POA: Diagnosis not present

## 2023-11-20 DIAGNOSIS — N400 Enlarged prostate without lower urinary tract symptoms: Secondary | ICD-10-CM | POA: Diagnosis not present

## 2023-11-20 DIAGNOSIS — F4310 Post-traumatic stress disorder, unspecified: Secondary | ICD-10-CM | POA: Diagnosis not present

## 2023-11-20 DIAGNOSIS — J181 Lobar pneumonia, unspecified organism: Secondary | ICD-10-CM | POA: Diagnosis not present

## 2023-11-20 DIAGNOSIS — I509 Heart failure, unspecified: Secondary | ICD-10-CM | POA: Diagnosis not present

## 2023-11-20 DIAGNOSIS — Z86718 Personal history of other venous thrombosis and embolism: Secondary | ICD-10-CM | POA: Diagnosis not present

## 2023-11-20 DIAGNOSIS — K8020 Calculus of gallbladder without cholecystitis without obstruction: Secondary | ICD-10-CM | POA: Diagnosis not present

## 2023-11-20 DIAGNOSIS — C9110 Chronic lymphocytic leukemia of B-cell type not having achieved remission: Secondary | ICD-10-CM | POA: Diagnosis not present

## 2023-11-20 DIAGNOSIS — Z8679 Personal history of other diseases of the circulatory system: Secondary | ICD-10-CM | POA: Diagnosis not present

## 2023-11-20 DIAGNOSIS — R6889 Other general symptoms and signs: Secondary | ICD-10-CM | POA: Diagnosis not present

## 2023-11-20 DIAGNOSIS — N183 Chronic kidney disease, stage 3 unspecified: Secondary | ICD-10-CM | POA: Diagnosis not present

## 2023-11-20 DIAGNOSIS — F0390 Unspecified dementia without behavioral disturbance: Secondary | ICD-10-CM | POA: Diagnosis not present

## 2023-11-20 DIAGNOSIS — M6281 Muscle weakness (generalized): Secondary | ICD-10-CM | POA: Diagnosis not present

## 2023-11-20 DIAGNOSIS — I10 Essential (primary) hypertension: Secondary | ICD-10-CM | POA: Diagnosis not present

## 2023-11-20 DIAGNOSIS — N189 Chronic kidney disease, unspecified: Secondary | ICD-10-CM | POA: Diagnosis not present

## 2023-11-20 DIAGNOSIS — R531 Weakness: Secondary | ICD-10-CM | POA: Diagnosis not present

## 2023-11-20 DIAGNOSIS — Z7401 Bed confinement status: Secondary | ICD-10-CM | POA: Diagnosis not present

## 2023-11-20 DIAGNOSIS — Z79899 Other long term (current) drug therapy: Secondary | ICD-10-CM | POA: Diagnosis not present

## 2023-11-20 LAB — COMPREHENSIVE METABOLIC PANEL WITH GFR
ALT: 63 U/L — ABNORMAL HIGH (ref 0–44)
AST: 88 U/L — ABNORMAL HIGH (ref 15–41)
Albumin: 2.9 g/dL — ABNORMAL LOW (ref 3.5–5.0)
Alkaline Phosphatase: 144 U/L — ABNORMAL HIGH (ref 38–126)
Anion gap: 13 (ref 5–15)
BUN: 56 mg/dL — ABNORMAL HIGH (ref 8–23)
CO2: 21 mmol/L — ABNORMAL LOW (ref 22–32)
Calcium: 8.7 mg/dL — ABNORMAL LOW (ref 8.9–10.3)
Chloride: 107 mmol/L (ref 98–111)
Creatinine, Ser: 1.84 mg/dL — ABNORMAL HIGH (ref 0.61–1.24)
GFR, Estimated: 37 mL/min — ABNORMAL LOW (ref >60)
Glucose, Bld: 165 mg/dL — ABNORMAL HIGH (ref 70–99)
Potassium: 4 mmol/L (ref 3.5–5.1)
Sodium: 141 mmol/L (ref 135–145)
Total Bilirubin: 1.3 mg/dL — ABNORMAL HIGH (ref 0.0–1.2)
Total Protein: 6.5 g/dL (ref 6.5–8.1)

## 2023-11-20 LAB — CBC WITH DIFFERENTIAL/PLATELET
Abs Immature Granulocytes: 0 10*3/uL (ref 0.00–0.07)
Basophils Absolute: 0.3 10*3/uL — ABNORMAL HIGH (ref 0.0–0.1)
Basophils Relative: 1 %
Eosinophils Absolute: 0.3 10*3/uL (ref 0.0–0.5)
Eosinophils Relative: 1 %
HCT: 40.6 % (ref 39.0–52.0)
Hemoglobin: 12.5 g/dL — ABNORMAL LOW (ref 13.0–17.0)
Lymphocytes Relative: 69 %
Lymphs Abs: 18.7 10*3/uL — ABNORMAL HIGH (ref 0.7–4.0)
MCH: 30.2 pg (ref 26.0–34.0)
MCHC: 30.8 g/dL (ref 30.0–36.0)
MCV: 98.1 fL (ref 80.0–100.0)
Monocytes Absolute: 0 10*3/uL — ABNORMAL LOW (ref 0.1–1.0)
Monocytes Relative: 0 %
Neutro Abs: 7.9 10*3/uL — ABNORMAL HIGH (ref 1.7–7.7)
Neutrophils Relative %: 29 %
Platelets: 176 10*3/uL (ref 150–400)
RBC: 4.14 MIL/uL — ABNORMAL LOW (ref 4.22–5.81)
RDW: 14.4 % (ref 11.5–15.5)
WBC: 27.1 10*3/uL — ABNORMAL HIGH (ref 4.0–10.5)
nRBC: 0 % (ref 0.0–0.2)

## 2023-11-20 LAB — LACTIC ACID, PLASMA
Lactic Acid, Venous: 1.4 mmol/L (ref 0.5–1.9)
Lactic Acid, Venous: 1.2 mmol/L (ref 0.5–1.9)

## 2023-11-20 LAB — LIPASE, BLOOD: Lipase: 50 U/L (ref 11–51)

## 2023-11-20 MED ORDER — IOHEXOL 300 MG/ML  SOLN
80.0000 mL | Freq: Once | INTRAMUSCULAR | Status: AC | PRN
  Administered 2023-11-20: 80 mL via INTRAVENOUS

## 2023-11-20 MED ORDER — POLYETHYLENE GLYCOL 3350 17 G PO PACK: 17.0000 g | PACK | Freq: Every day | ORAL | 2 refills | Status: DC

## 2023-11-20 NOTE — ED Notes (Signed)
 Writer spoke with Athena Bland from [facility] about patient [name] coming back. Room number provided was [id number] and number to call report was [phone]. Family aware. TOC signing off.,

## 2023-11-20 NOTE — ED Notes (Signed)
 Ambulance service canceled until Dr. Adrain Alar decides pain for patient bowel movement.

## 2023-11-20 NOTE — ED Notes (Signed)
 Ambulance services notified patient needs transportation to [facility]. Patient placed in queue. Pt wife notified.

## 2023-11-20 NOTE — ED Notes (Signed)
 Pt provided pericare after enema. Bed pad changed, diaper change, blankets changed.

## 2023-11-20 NOTE — ED Notes (Signed)
 Pt received lunch tray

## 2023-11-20 NOTE — ED Notes (Signed)
 Pt had full bed linen change, diaper change, bed pad changed, and gown change. Pericare provided. Pt also given a full bed bath with help of NT and patient wife. Sacrum patch applied to help prevent patient skin breakdown.

## 2023-11-20 NOTE — Progress Notes (Signed)
 Speech Language Pathology Treatment: Dysphagia  Patient Details Name: RAYDIN BIELINSKI MRN: [id number] DOB: 03/22/1946 Today's Date: 11/20/2023 Time: 2595-6387 SLP Time Calculation (min) (ACUTE ONLY): 28 min  Assessment / Plan / Recommendation Clinical Impression  Pt seen for ST targeting dysphagia management. Pt's wife present at bedside, reports his PO intake remains poor. She states he like chocolate pudding, Ensure, and other sweet items. Pt required mod verbal/tactile stimuli to wake and maintain his alertness during session. He benefited from an ice chip on his lips to increase alertness/readiness for PO.   SLP administered ice chips, thin liquid water  via spoon/cup/straw, and pudding via spoon. Delayed throat clear x1 with pudding and immediate cough x1 with straw sip of thin liquids, concerning for laryngeal penetration. No overt s/sx of pen/asp with ice chips or thin water  via tsp. Multiple swallows (2-3) noted with pudding. Oral holding x1 with puree when alertness decreased, swallow was prompted by small sip of thin via tsp. Pt is not appropriate for diet texture upgrade d/t high potential for choking with his reduced alertness, oral holding, and pocketing.   SLP provided education on strategies to increase PO intake (alternating between preferred and nonpreferred food items in a 1:1 ratio and mixing preferred sweet item with a nonpreferred item to see if it increases acceptance) as well as giving a small sip of liquid to reduce oral holding of puree. Recommended pt's wife give frequent ice chips and tsp of thin liquids (when alertness is maintained) to prevent deconditioning and to check for pocketing/holding during PO intake. Wife verbalized understanding. ST to follow up at least x1 to determine potential for diet texture upgrade while acute, determine if strategies introduced were successful, and to provide continued caregiver education.    HPI HPI: ZUHAYR DEENEY is a 78 year old male  with a history of CAD status post PCI, CHF, DVT sp IVC filter, hypertension, hyperlipidemia, CLL and dementia who presents to the emergency increasing weakness history obtained per EMS.  They state that his wife says that he was also not eating and falling.  She feels that she cannot take care of him at home. (per MD). BSE requested due to Pt pocketing medications.      SLP Plan  Continue with current plan of care.           Recommendations  Diet recommendations: Dysphagia 1 (puree);Thin liquid Liquids provided via: Teaspoon;Cup Medication Administration: Crushed with puree Supervision: Staff to assist with self feeding;Trained caregiver to feed patient Compensations: Slow rate;Small sips/bites;Minimize environmental distractions;Follow solids with liquid Postural Changes and/or Swallow Maneuvers: Seated upright 90 degrees;Upright 30-60 min after meal                  Oral care BID;Staff/trained caregiver to provide oral care   Frequent or constant Supervision/Assistance Dysphagia, oral phase (R13.11)     Continue with current plan of care     Caretha Chapel, MA CCC-SLP  11/20/2023, 1:10 PM

## 2023-11-20 NOTE — ED Provider Notes (Signed)
 This patient has been boarding in the ED for the past 6 days.  Over that time, he had not had a bowel movement.  He underwent a CT scan of his abdomen and pelvis today.  Results showed a large volume of stool in the rectum.  He underwent manual disimpaction with a large amount of stool removed.  This was followed up with soapsuds enema.  Patient would benefit from a bowel regimen.  MiraLAX was prescribed.  He is stable for discharge to facility.  .Fecal disimpaction  Date/Time: 11/20/2023 7:35 PM  Performed by: Iva Mariner, MD Authorized by: Iva Mariner, MD  Consent: Verbal consent obtained Risks and benefits: risks, benefits and alternatives were discussed Consent given by: spouse Patient understanding: patient states understanding of the procedure being performed Imaging studies: imaging studies available Patient identity confirmed: arm band Local anesthesia used: no  Anesthesia: Local anesthesia used: no  Sedation: Patient sedated: no  Patient tolerance: patient tolerated the procedure well with no immediate complications Comments: Large volume of firm stool balls removed.       Iva Mariner, MD 11/20/23 [place]

## 2023-11-20 NOTE — ED Notes (Signed)
 Attempted to call [facility] for update about discharge Unsuccessful x2

## 2023-11-20 NOTE — ED Notes (Signed)
 Writer reviewed patient insurance auth this morning. Patient insurance authorization is still pending. TOC to follow.

## 2023-11-20 NOTE — ED Provider Notes (Addendum)
 Emergency Medicine Observation Re-evaluation Note  [name] is a 78 y.o. male, seen on rounds today.  Pt initially presented to the ED for complaints of Weakness Currently, the patient is resting in bed.  Physical Exam  BP 121/64 (BP Location: Right Arm)   Pulse 76   Temp 98.1 F (36.7 C) (Oral)   Resp 19   Ht 5' 3 (1.6 m)   Wt 63 kg   SpO2 98%   BMI 24.60 kg/m  Physical Exam General: NAD Cardiac: well perfused Lungs: eve and unlabored Psych: no agitation  ED Course / MDM  EKG:EKG Interpretation Date/Time:  Tuesday November 14 2023 17:40:02 EDT Ventricular Rate:  83 PR Interval:  140 QRS Duration:  81 QT Interval:  368 QTC Calculation: 433 R Axis:   -23  Text Interpretation: Sinus rhythm Abnormal R-wave progression, late transition Inferior infarct, old Confirmed by Shyrl Doyne [phone]) on 11/14/2023 6:55:12 PM  I have reviewed the labs performed to date as well as medications administered while in observation.  Recent changes in the last 24 hours include none, plan for transfer to [facility] today.  Plan  Current plan is for placement today.    Plan had been for placement however, the patient's wife reports he has not had a bowel movement in the past 7 days. On exam, the patient was midlly tender to palpation. Will proceed with workup of the patient's constipation to rule out SBO.  Labs and imaging pending, signout given to Dr. Kermit Ped to follow-up results of testing.   Rosealee Concha, MD 11/20/23 [phone]

## 2023-11-22 DIAGNOSIS — F0390 Unspecified dementia without behavioral disturbance: Secondary | ICD-10-CM | POA: Diagnosis not present

## 2023-11-22 DIAGNOSIS — Z8679 Personal history of other diseases of the circulatory system: Secondary | ICD-10-CM | POA: Diagnosis not present

## 2023-11-22 DIAGNOSIS — L89152 Pressure ulcer of sacral region, stage 2: Secondary | ICD-10-CM | POA: Diagnosis not present

## 2023-11-22 DIAGNOSIS — Z95828 Presence of other vascular implants and grafts: Secondary | ICD-10-CM | POA: Diagnosis not present

## 2023-11-22 DIAGNOSIS — C9110 Chronic lymphocytic leukemia of B-cell type not having achieved remission: Secondary | ICD-10-CM | POA: Diagnosis not present

## 2023-11-22 DIAGNOSIS — N189 Chronic kidney disease, unspecified: Secondary | ICD-10-CM | POA: Diagnosis not present

## 2023-11-22 DIAGNOSIS — E785 Hyperlipidemia, unspecified: Secondary | ICD-10-CM | POA: Diagnosis not present

## 2023-11-22 DIAGNOSIS — K219 Gastro-esophageal reflux disease without esophagitis: Secondary | ICD-10-CM | POA: Diagnosis not present

## 2023-11-22 DIAGNOSIS — Z86718 Personal history of other venous thrombosis and embolism: Secondary | ICD-10-CM | POA: Diagnosis not present

## 2023-11-22 DIAGNOSIS — I2510 Atherosclerotic heart disease of native coronary artery without angina pectoris: Secondary | ICD-10-CM | POA: Diagnosis not present

## 2023-11-22 DIAGNOSIS — I10 Essential (primary) hypertension: Secondary | ICD-10-CM | POA: Diagnosis not present

## 2023-11-23 ENCOUNTER — Other Ambulatory Visit: Payer: Self-pay

## 2023-11-23 DIAGNOSIS — E785 Hyperlipidemia, unspecified: Secondary | ICD-10-CM | POA: Diagnosis not present

## 2023-11-23 DIAGNOSIS — Z7189 Other specified counseling: Secondary | ICD-10-CM | POA: Diagnosis not present

## 2023-11-23 DIAGNOSIS — I2510 Atherosclerotic heart disease of native coronary artery without angina pectoris: Secondary | ICD-10-CM | POA: Diagnosis not present

## 2023-11-23 DIAGNOSIS — F0390 Unspecified dementia without behavioral disturbance: Secondary | ICD-10-CM | POA: Diagnosis not present

## 2023-11-23 DIAGNOSIS — Z79899 Other long term (current) drug therapy: Secondary | ICD-10-CM | POA: Diagnosis not present

## 2023-11-23 DIAGNOSIS — C9110 Chronic lymphocytic leukemia of B-cell type not having achieved remission: Secondary | ICD-10-CM | POA: Diagnosis not present

## 2023-11-23 NOTE — Patient Instructions (Signed)
 Visit Information  Thank you for taking time to visit with me today. Please don't hesitate to contact me if I can be of assistance to you before our next scheduled appointment.  Your next care management appointment is by telephone on 12/07/2023 at 10:30 am   Please call the care guide team at [phone] if you need to cancel, schedule, or reschedule an appointment.   Please call the Suicide and Crisis Lifeline: 988 if you are experiencing a Mental Health or Behavioral Health Crisis or need someone to talk to.  [name], MSW, LCSW [place]  [facility], [facility] Licensed Clinical Social Worker Direct Dial: [phone]

## 2023-11-23 NOTE — Patient Outreach (Signed)
 Complex Care Management   Visit Note  11/23/2023  Name:  [name] MRN: [id number] DOB: Aug 18, 1945  Situation: Referral received for Complex Care Management related to Mental/Behavioral Health diagnosis dementia. I obtained verbal consent from Caregiver.  Visit completed with caregiver.  on the phone. Caregiver reports that patient was admitted into [facility] on 11/20/2023 and plan is for LTC. Medicaid application is still pending.  Background:   Past Medical History:  Diagnosis Date   Acute MI, inferolateral wall, initial episode of care (HCC) 06/16/2015   99% dCx    Acute respiratory failure with hypoxia (HCC) 05/22/2019   BPH (benign prostatic hyperplasia) 04/08/2011   CAD S/P percutaneous coronary angioplasty 11/23/12; 06/16/15   a. NSTEMI 2014 with 2 overlapping mRCA lesions - Xience Xpedition DES 2.75 mm x 18 mm (3.0 mm). b. Inf-Lat STEMI 06/16/2015 - dCx Asp Thrombectomy & PCI 3.0 x 15 Xience drDES (3.6 mm).   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     Stable.  No melena, hematochezia or hematuria.     Ischemic cardiomyopathy    Echo 06/18/2015 EF 35-40% after lateral MI -> repeat echo 08/06/2015: EF 40-45% with basal-mid inferolateral akinesis and hypokinesis of the lateral wall.   Memory loss 07/09/2019   Near syncope 11/08/2022   NSTEMI (non-ST elevated myocardial infarction) (HCC) 11/23/2012   NSTEMI 11/23/2012 RCA lesion - PCI with DES; moderate LAD disease   PTSD (post-traumatic stress disorder)    With Depression - since MI   Recurrent coronary arteriosclerosis after percutaneous transluminal coronary angioplasty 11/24/2012   PCI of mid RCA -- Xience eXp 2.75 mm x 18 mm (post-dil to 3.1 mm) 2014  PCI of CFX with DES 06/16/15     Last Assessment & Plan:    DES stents in the RCA and LCx.  No recurrent angina symptoms.     Plan: Continue monotherapy with Plavix .   Okay to hold Plavix  5 days preop for surgeries or procedures.      Assessment: Patient Reported Symptoms:  Cognitive Cognitive Status: Confused or disoriented, Requires Assistance Decision Making, Struggling with memory recall Cognitive/Intellectual Conditions Management [RPT]: Other (Dementia)      Neurological Neurological Review of Symptoms: Not assessed    HEENT HEENT Symptoms Reported: Not assessed      Cardiovascular Cardiovascular Symptoms Reported: Not assessed    Respiratory Respiratory Symptoms Reported: Not assesed    Endocrine Patient reports the following symptoms related to hypoglycemia or hyperglycemia :  Not assessed    Gastrointestinal Gastrointestinal Symptoms Reported: Not assessed      Genitourinary Genitourinary Symptoms Reported: Not assessed    Integumentary Integumentary Symptoms Reported: Not assessed    Musculoskeletal Musculoskelatal Symptoms Reviewed: Not assessed        Psychosocial Psychosocial Symptoms Reported: Not assessed            11/07/2023    1:25 PM  Depression screen PHQ 2/9   Decreased Interest 2  Down, Depressed, Hopeless 2  PHQ - 2 Score 4  Altered sleeping 0  Tired, decreased energy 1  Change in appetite 1  Feeling bad or failure about yourself  1  Trouble concentrating 1  Moving slowly or fidgety/restless 1  Suicidal thoughts 0  PHQ-9 Score 9  Difficult doing work/chores Somewhat difficult    There were no vitals filed for this visit.  Medications Reviewed Today     Reviewed by Festus Hubert (Social Worker) on 11/23/23 at 1039  Med List Status: <None>   Medication Order Taking? Sig Documenting Provider Last Dose Status Informant  atorvastatin  (LIPITOR) 40 MG tablet [id number] No Take 1 tablet (40 mg total) by mouth daily. Zorita Hiss, NP 11/13/2023 Bedtime Active Spouse/Significant Other, Pharmacy Records  cetirizine (ZYRTEC) 10 MG tablet [id number] No Take 1 tablet by mouth daily. [provider] 11/13/2023 Bedtime Active Spouse/Significant Other, Pharmacy Records  clopidogrel  (PLAVIX ) 75 MG tablet [id number] No TAKE 1 TABLET BY MOUTH EVERY DAY  Patient taking differently: Take 75 mg by mouth daily.   Lenor Raddle, MD 11/14/2023 11:00 AM Active Spouse/Significant Other, Pharmacy Records  donepezil  (ARICEPT ) 5 MG tablet [id number] No Take 1 tablet by mouth daily. [provider] 11/13/2023 Morning Active Spouse/Significant Other, Pharmacy Records  famotidine  (PEPCID ) 20 MG tablet [id number] No Take 1 tablet every day by oral route. [provider] 11/13/2023 Morning Active Spouse/Significant Other, Pharmacy Records  furosemide  (LASIX ) 20 MG tablet [id number] No Take 1 tablet (20 mg total) by mouth every Monday, Wednesday, and Friday. Arleen Lacer, MD 11/13/2023 Morning Active Spouse/Significant Other, Pharmacy Records  lisinopril  (ZESTRIL ) 2.5 MG tablet [id number] No Take 1 tablet (2.5 mg total) by mouth every Tuesday, Thursday, Saturday, and Sunday. Arleen Lacer, MD 11/14/2023 Morning Active Spouse/Significant Other,  Pharmacy Records  memantine  (NAMENDA ) 10 MG tablet [id number] No Take 10 mg by mouth 2 (two) times daily. [provider] 11/14/2023 Morning Active Spouse/Significant Other, Pharmacy Records  mirtazapine  (REMERON ) 7.5 MG tablet [id number] No Take 1 tablet by mouth daily. [provider] 11/13/2023 Bedtime Active Spouse/Significant Other, Pharmacy Records  nitroGLYCERIN  (NITROSTAT ) 0.4 MG SL tablet [id number] No Place 0.4 mg under the tongue every 5 (five) minutes as needed for chest pain. [provider] Unknown Active Spouse/Significant Other, Pharmacy Records           Med Note (WARD, ANGELICA G   Tue Nov 14, 2023  6:23 PM)    ondansetron  (ZOFRAN -ODT) 4 MG disintegrating tablet [id number] No Take 1 tablet (4 mg total) by mouth every 8 (eight) hours as needed for nausea or vomiting. Corbin Dess, PA-C Past Month Active Spouse/Significant Other, Pharmacy Records  polyethylene glycol (MIRALAX) 17 g packet [id number]  Take 17 g by mouth daily. Iva Mariner, MD  Active   risperiDONE  (RISPERDAL ) 0.25 MG tablet [id number] No Take 0.25 mg by mouth 2 (two) times daily. [provider] 11/13/2023 Evening Active Spouse/Significant Other, Pharmacy Records  Recommendation:   Continue Current Plan of Care  Follow Up Plan:   Telephone follow-up 12/07/2023 at 10:30 am  Georgine Kitchens, MSW, LCSW [place]  [facility], [facility] Licensed Clinical Social Worker Direct Dial: [phone]

## 2023-11-27 DIAGNOSIS — F32A Depression, unspecified: Secondary | ICD-10-CM | POA: Diagnosis not present

## 2023-11-27 DIAGNOSIS — K219 Gastro-esophageal reflux disease without esophagitis: Secondary | ICD-10-CM | POA: Diagnosis not present

## 2023-11-27 DIAGNOSIS — I10 Essential (primary) hypertension: Secondary | ICD-10-CM | POA: Diagnosis not present

## 2023-11-27 DIAGNOSIS — I509 Heart failure, unspecified: Secondary | ICD-10-CM | POA: Diagnosis not present

## 2023-11-27 DIAGNOSIS — F0390 Unspecified dementia without behavioral disturbance: Secondary | ICD-10-CM | POA: Diagnosis not present

## 2023-11-27 DIAGNOSIS — Z79899 Other long term (current) drug therapy: Secondary | ICD-10-CM | POA: Diagnosis not present

## 2023-11-27 NOTE — Progress Notes (Signed)
 11/27/2023  15:46 [facility] requested FL2. FL2 printed and faxed.

## 2023-11-28 DIAGNOSIS — I130 Hypertensive heart and chronic kidney disease with heart failure and stage 1 through stage 4 chronic kidney disease, or unspecified chronic kidney disease: Secondary | ICD-10-CM | POA: Diagnosis not present

## 2023-11-28 DIAGNOSIS — I2510 Atherosclerotic heart disease of native coronary artery without angina pectoris: Secondary | ICD-10-CM | POA: Diagnosis not present

## 2023-11-28 DIAGNOSIS — F4310 Post-traumatic stress disorder, unspecified: Secondary | ICD-10-CM | POA: Diagnosis not present

## 2023-11-28 DIAGNOSIS — N400 Enlarged prostate without lower urinary tract symptoms: Secondary | ICD-10-CM | POA: Diagnosis not present

## 2023-11-28 DIAGNOSIS — Z79899 Other long term (current) drug therapy: Secondary | ICD-10-CM | POA: Diagnosis not present

## 2023-12-05 DIAGNOSIS — Z79899 Other long term (current) drug therapy: Secondary | ICD-10-CM | POA: Diagnosis not present

## 2023-12-05 DIAGNOSIS — R109 Unspecified abdominal pain: Secondary | ICD-10-CM | POA: Diagnosis not present

## 2023-12-05 DIAGNOSIS — I130 Hypertensive heart and chronic kidney disease with heart failure and stage 1 through stage 4 chronic kidney disease, or unspecified chronic kidney disease: Secondary | ICD-10-CM | POA: Diagnosis not present

## 2023-12-05 DIAGNOSIS — I509 Heart failure, unspecified: Secondary | ICD-10-CM | POA: Diagnosis not present

## 2023-12-05 DIAGNOSIS — I2510 Atherosclerotic heart disease of native coronary artery without angina pectoris: Secondary | ICD-10-CM | POA: Diagnosis not present

## 2023-12-05 DIAGNOSIS — R531 Weakness: Secondary | ICD-10-CM | POA: Diagnosis not present

## 2023-12-05 DIAGNOSIS — R3989 Other symptoms and signs involving the genitourinary system: Secondary | ICD-10-CM | POA: Diagnosis not present

## 2023-12-06 DIAGNOSIS — N189 Chronic kidney disease, unspecified: Secondary | ICD-10-CM | POA: Diagnosis not present

## 2023-12-06 DIAGNOSIS — F0390 Unspecified dementia without behavioral disturbance: Secondary | ICD-10-CM | POA: Diagnosis not present

## 2023-12-06 DIAGNOSIS — Z8679 Personal history of other diseases of the circulatory system: Secondary | ICD-10-CM | POA: Diagnosis not present

## 2023-12-06 DIAGNOSIS — K219 Gastro-esophageal reflux disease without esophagitis: Secondary | ICD-10-CM | POA: Diagnosis not present

## 2023-12-06 DIAGNOSIS — Z95828 Presence of other vascular implants and grafts: Secondary | ICD-10-CM | POA: Diagnosis not present

## 2023-12-06 DIAGNOSIS — C9110 Chronic lymphocytic leukemia of B-cell type not having achieved remission: Secondary | ICD-10-CM | POA: Diagnosis not present

## 2023-12-06 DIAGNOSIS — I10 Essential (primary) hypertension: Secondary | ICD-10-CM | POA: Diagnosis not present

## 2023-12-06 DIAGNOSIS — Z86718 Personal history of other venous thrombosis and embolism: Secondary | ICD-10-CM | POA: Diagnosis not present

## 2023-12-06 DIAGNOSIS — L89152 Pressure ulcer of sacral region, stage 2: Secondary | ICD-10-CM | POA: Diagnosis not present

## 2023-12-06 DIAGNOSIS — Z515 Encounter for palliative care: Secondary | ICD-10-CM | POA: Diagnosis not present

## 2023-12-06 DIAGNOSIS — R531 Weakness: Secondary | ICD-10-CM | POA: Diagnosis not present

## 2023-12-06 DIAGNOSIS — E785 Hyperlipidemia, unspecified: Secondary | ICD-10-CM | POA: Diagnosis not present

## 2023-12-06 DIAGNOSIS — R0989 Other specified symptoms and signs involving the circulatory and respiratory systems: Secondary | ICD-10-CM | POA: Diagnosis not present

## 2023-12-07 ENCOUNTER — Other Ambulatory Visit: Payer: Self-pay

## 2023-12-07 NOTE — Patient Outreach (Addendum)
 Complex Care Management   Visit Note  12/07/2023  Name:  [name] MRN: [id number] DOB: 1946/04/01  Situation: Referral received for Complex Care Management related to Mental/Behavioral Health diagnosis dementia I obtained verbal consent from Caregiver.  Visit completed with caregiver  on the phone. LCSW spoke with patient's caregiver Maceo to receive an update about patient. Maceo reports that patient is still at [facility] however his health has declined. LCSW inquired about Medicaid application which is still pending. LCSW received verbal permission to contact [facility]. LCSW spoke with Whitney at [facility] and confirmed that they have LTC bed and awaiting to hear back about the Medicaid.  Background:   Past Medical History:  Diagnosis Date   Acute MI, inferolateral wall, initial episode of care (HCC) 06/16/2015   99% dCx    Acute respiratory failure with hypoxia (HCC) 05/22/2019   BPH (benign prostatic hyperplasia) 04/08/2011   CAD S/P percutaneous coronary angioplasty 11/23/12; 06/16/15   a. NSTEMI 2014 with 2 overlapping mRCA lesions - Xience Xpedition DES 2.75 mm x 18 mm (3.0 mm). b. Inf-Lat STEMI 06/16/2015 - dCx Asp Thrombectomy & PCI 3.0 x 15 Xience drDES (3.6 mm).   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     Stable.  No melena, hematochezia or hematuria.     Ischemic cardiomyopathy    Echo 06/18/2015 EF 35-40% after lateral MI -> repeat echo 08/06/2015: EF 40-45% with basal-mid inferolateral akinesis and hypokinesis of the lateral wall.   Memory loss 07/09/2019   Near syncope 11/08/2022   NSTEMI (non-ST elevated myocardial infarction) (HCC) 11/23/2012   NSTEMI 11/23/2012 RCA lesion - PCI with DES; moderate LAD disease   PTSD (post-traumatic stress disorder)    With Depression - since MI   Recurrent coronary arteriosclerosis after percutaneous transluminal coronary angioplasty 11/24/2012   PCI of mid RCA -- Xience eXp 2.75 mm x 18 mm (post-dil to 3.1 mm) 2014  PCI of CFX with DES 06/16/15     Last Assessment & Plan:    DES stents in the RCA and LCx.  No recurrent angina symptoms.     Plan: Continue monotherapy with Plavix .   Okay to hold Plavix  5 days preop for surgeries or procedures.      Assessment: Patient Reported Symptoms:  Cognitive Cognitive Status: Confused or disoriented, Requires Assistance Decision Making, Struggling with memory recall      Neurological Neurological Review of Symptoms: Not assessed    HEENT HEENT Symptoms Reported:  Not assessed      Cardiovascular Cardiovascular Symptoms Reported: Not assessed    Respiratory Respiratory Symptoms Reported: Not assesed    Endocrine Endocrine Symptoms Reported: Not assessed    Gastrointestinal Gastrointestinal Symptoms Reported: Not assessed      Genitourinary Genitourinary Symptoms Reported: Not assessed    Integumentary Integumentary Symptoms Reported: Not assessed    Musculoskeletal Musculoskelatal Symptoms Reviewed: Not  assessed        Psychosocial Psychosocial Symptoms Reported: Not assessed            11/07/2023    1:25 PM  Depression screen PHQ 2/9  Decreased Interest 2  Down, Depressed, Hopeless 2  PHQ - 2 Score 4  Altered sleeping 0  Tired, decreased energy 1  Change in appetite 1  Feeling bad or failure about yourself  1  Trouble concentrating 1  Moving slowly or fidgety/restless 1  Suicidal thoughts 0  PHQ-9 Score 9  Difficult doing work/chores Somewhat difficult    There were no vitals filed for this visit.  Medications Reviewed Today   Medications were not reviewed in this encounter, patient is at [facility] therefore medications may have changed.     Recommendation:   Continue Current Plan of Care  Follow Up Plan:   Telephone follow-up 12/14/2023 at 10:00 am  Olam Ally, MSW, [employer]  [facility], [facility] Licensed Clinical Social Worker Direct Dial: [phone]

## 2023-12-08 DIAGNOSIS — F4312 Post-traumatic stress disorder, chronic: Secondary | ICD-10-CM | POA: Diagnosis not present

## 2023-12-08 DIAGNOSIS — F01B18 Vascular dementia, moderate, with other behavioral disturbance: Secondary | ICD-10-CM | POA: Diagnosis not present

## 2023-12-11 DIAGNOSIS — Z79899 Other long term (current) drug therapy: Secondary | ICD-10-CM | POA: Diagnosis not present

## 2023-12-11 DIAGNOSIS — R6889 Other general symptoms and signs: Secondary | ICD-10-CM | POA: Diagnosis not present

## 2023-12-11 DIAGNOSIS — F0390 Unspecified dementia without behavioral disturbance: Secondary | ICD-10-CM | POA: Diagnosis not present

## 2023-12-11 DIAGNOSIS — Z7189 Other specified counseling: Secondary | ICD-10-CM | POA: Diagnosis not present

## 2023-12-11 DIAGNOSIS — C9110 Chronic lymphocytic leukemia of B-cell type not having achieved remission: Secondary | ICD-10-CM | POA: Diagnosis not present

## 2023-12-12 DIAGNOSIS — R52 Pain, unspecified: Secondary | ICD-10-CM | POA: Diagnosis not present

## 2023-12-12 DIAGNOSIS — Z79899 Other long term (current) drug therapy: Secondary | ICD-10-CM | POA: Diagnosis not present

## 2023-12-14 ENCOUNTER — Telehealth: Payer: Self-pay

## 2023-12-14 ENCOUNTER — Other Ambulatory Visit

## 2023-12-14 NOTE — Patient Outreach (Signed)
 LCSW attempted to reach out to patient spouse Maceo to check in to ascertain how she is doing since patient's passing and offer support. Maceo initially answered the phone however it became disconnected, LCSW called back and was unable to reach her and left a message.  Olam Ally, MSW, LCSW [place]  [facility], [facility] Licensed Clinical Social Worker Direct Dial: [phone]

## 2024-01-05 DEATH — deceased

## 2024-02-21 ENCOUNTER — Other Ambulatory Visit

## 2024-02-23 ENCOUNTER — Ambulatory Visit: Admitting: Oncology
